# Patient Record
Sex: Female | Born: 1935
Health system: Southern US, Community
[De-identification: ages and names within clinical notes are randomized; demographics above are authoritative.]

## PROBLEM LIST (undated history)

## (undated) DIAGNOSIS — F419 Anxiety disorder, unspecified: Secondary | ICD-10-CM

## (undated) DIAGNOSIS — F32A Depression, unspecified: Secondary | ICD-10-CM

## (undated) DIAGNOSIS — M839 Adult osteomalacia, unspecified: Secondary | ICD-10-CM

## (undated) DIAGNOSIS — R42 Dizziness and giddiness: Secondary | ICD-10-CM

## (undated) DIAGNOSIS — R911 Solitary pulmonary nodule: Secondary | ICD-10-CM

## (undated) DIAGNOSIS — Z923 Personal history of irradiation: Secondary | ICD-10-CM

## (undated) DIAGNOSIS — C801 Malignant (primary) neoplasm, unspecified: Secondary | ICD-10-CM

## (undated) DIAGNOSIS — I639 Cerebral infarction, unspecified: Secondary | ICD-10-CM

## (undated) DIAGNOSIS — K224 Dyskinesia of esophagus: Secondary | ICD-10-CM

## (undated) DIAGNOSIS — M549 Dorsalgia, unspecified: Secondary | ICD-10-CM

## (undated) DIAGNOSIS — G4733 Obstructive sleep apnea (adult) (pediatric): Secondary | ICD-10-CM

## (undated) DIAGNOSIS — F329 Major depressive disorder, single episode, unspecified: Secondary | ICD-10-CM

## (undated) HISTORY — DX: Dorsalgia, unspecified: M54.9

## (undated) HISTORY — DX: Dyskinesia of esophagus: K22.4

## (undated) HISTORY — PX: CARPAL TUNNEL RELEASE: SHX101

## (undated) HISTORY — PX: APPENDECTOMY: SHX54

## (undated) HISTORY — PX: ROTATOR CUFF REPAIR: SHX139

## (undated) HISTORY — DX: Obstructive sleep apnea (adult) (pediatric): G47.33

## (undated) HISTORY — PX: ABDOMINAL HYSTERECTOMY: SHX81

---

## 1976-12-18 HISTORY — PX: BREAST EXCISIONAL BIOPSY: SUR124

## 1998-05-20 ENCOUNTER — Ambulatory Visit (HOSPITAL_COMMUNITY): Admission: RE | Admit: 1998-05-20 | Discharge: 1998-05-20 | Payer: Self-pay | Admitting: Obstetrics and Gynecology

## 1999-04-26 ENCOUNTER — Encounter: Payer: Self-pay | Admitting: Family Medicine

## 1999-04-27 ENCOUNTER — Inpatient Hospital Stay (HOSPITAL_COMMUNITY): Admission: EM | Admit: 1999-04-27 | Discharge: 1999-04-28 | Payer: Self-pay | Admitting: Emergency Medicine

## 1999-06-06 ENCOUNTER — Encounter: Payer: Self-pay | Admitting: Family Medicine

## 1999-06-06 ENCOUNTER — Ambulatory Visit (HOSPITAL_COMMUNITY): Admission: RE | Admit: 1999-06-06 | Discharge: 1999-06-06 | Payer: Self-pay | Admitting: Family Medicine

## 1999-08-02 ENCOUNTER — Other Ambulatory Visit: Admission: RE | Admit: 1999-08-02 | Discharge: 1999-08-02 | Payer: Self-pay | Admitting: Obstetrics and Gynecology

## 2000-04-16 ENCOUNTER — Encounter: Admission: RE | Admit: 2000-04-16 | Discharge: 2000-04-16 | Payer: Self-pay | Admitting: Family Medicine

## 2000-04-16 ENCOUNTER — Encounter: Payer: Self-pay | Admitting: Family Medicine

## 2000-09-27 ENCOUNTER — Other Ambulatory Visit: Admission: RE | Admit: 2000-09-27 | Discharge: 2000-09-27 | Payer: Self-pay | Admitting: Obstetrics and Gynecology

## 2002-04-16 ENCOUNTER — Other Ambulatory Visit: Admission: RE | Admit: 2002-04-16 | Discharge: 2002-04-16 | Payer: Self-pay | Admitting: Obstetrics and Gynecology

## 2002-06-10 ENCOUNTER — Ambulatory Visit (HOSPITAL_COMMUNITY): Admission: RE | Admit: 2002-06-10 | Discharge: 2002-06-10 | Payer: Self-pay | Admitting: Gastroenterology

## 2006-04-18 ENCOUNTER — Encounter: Payer: Self-pay | Admitting: Emergency Medicine

## 2007-08-09 ENCOUNTER — Ambulatory Visit (HOSPITAL_BASED_OUTPATIENT_CLINIC_OR_DEPARTMENT_OTHER): Admission: RE | Admit: 2007-08-09 | Discharge: 2007-08-09 | Payer: Self-pay | Admitting: Orthopedic Surgery

## 2007-08-28 ENCOUNTER — Ambulatory Visit (HOSPITAL_COMMUNITY): Admission: RE | Admit: 2007-08-28 | Discharge: 2007-08-28 | Payer: Self-pay | Admitting: Family Medicine

## 2007-09-04 ENCOUNTER — Ambulatory Visit (HOSPITAL_COMMUNITY): Admission: RE | Admit: 2007-09-04 | Discharge: 2007-09-04 | Payer: Self-pay | Admitting: Family Medicine

## 2007-11-01 ENCOUNTER — Emergency Department (HOSPITAL_COMMUNITY): Admission: EM | Admit: 2007-11-01 | Discharge: 2007-11-01 | Payer: Self-pay | Admitting: Emergency Medicine

## 2007-11-20 ENCOUNTER — Encounter: Admission: RE | Admit: 2007-11-20 | Discharge: 2007-11-20 | Payer: Self-pay | Admitting: Family Medicine

## 2008-06-25 ENCOUNTER — Encounter: Admission: RE | Admit: 2008-06-25 | Discharge: 2008-06-25 | Payer: Self-pay | Admitting: Family Medicine

## 2008-09-22 ENCOUNTER — Encounter: Admission: RE | Admit: 2008-09-22 | Discharge: 2008-09-22 | Payer: Self-pay | Admitting: Family Medicine

## 2008-12-21 ENCOUNTER — Encounter: Admission: RE | Admit: 2008-12-21 | Discharge: 2008-12-21 | Payer: Self-pay | Admitting: Family Medicine

## 2009-04-18 ENCOUNTER — Emergency Department (HOSPITAL_COMMUNITY): Admission: EM | Admit: 2009-04-18 | Discharge: 2009-04-18 | Payer: Self-pay | Admitting: Emergency Medicine

## 2009-05-04 ENCOUNTER — Encounter: Admission: RE | Admit: 2009-05-04 | Discharge: 2009-06-02 | Payer: Self-pay | Admitting: Family Medicine

## 2010-08-29 ENCOUNTER — Encounter: Admission: RE | Admit: 2010-08-29 | Discharge: 2010-08-29 | Payer: Self-pay | Admitting: Family Medicine

## 2011-01-09 ENCOUNTER — Encounter: Payer: Self-pay | Admitting: Family Medicine

## 2011-05-02 NOTE — Op Note (Signed)
[name], BOHNET NO.:  [id number]   MEDICAL RECORD NO.:  [id number]          PATIENT TYPE:  AMB   LOCATION:  [facility]                         FACILITY:  [facility]   PHYSICIAN:  Marlowe Kays, M.D.  DATE OF BIRTH:  02-19-1932   DATE OF PROCEDURE:  08/09/2007  DATE OF DISCHARGE:                               OPERATIVE REPORT   PREOPERATIVE DIAGNOSIS:  Severe right carpal tunnel syndrome.   POSTOPERATIVE DIAGNOSIS:  Severe right carpal tunnel syndrome.   OPERATION:  Decompression median nerve right wrist and hand.   SURGEON:  Marlowe Kays, M.D.   ASSISTANT:  Nurse   ANESTHESIA:  IV regional.   JUSTIFICATION FOR PROCEDURE:  She had significant median nerve  compression on nerve conduction studies with numbness and atrophy in her  hand. At surgery, she had marked compression of the median nerve in the  carpal canal. Proximal to the wrist, the nerve was quite bulbous and  discolored.  There was also some compression of some of the branches in  the distal palm.   DESCRIPTION OF PROCEDURE:  After satisfactory IV regional anesthesia, a  time out performed, DuraPrep from mid forearm to fingertips, was draped  in a sterile field.  I marked out a curved incision along the base of  the thenar eminence crossing obliquely over the flexor crease of the  wrist in the distal forearm.  The median nerve was identified at the  wrist with the findings noted above.  I progressively released the skin,  subcutaneous tissue, and thick fascia into the distal palm dissecting  out the individual branches.  There was no iatrogenic complication.  After I felt the decompression had been completed, I irrigated the wound  well with sterile saline and closed the skin and subcutaneous tissue  only with interrupted 4-0 nylon mast sutures.  Betadine and Adaptic dry,  sterile dressing and volar plaster splint were applied.  The tourniquet  was released.  She tolerated the procedure well and  was taken to  recovery in satisfactory condition with no known complications.           ______________________________  Marlowe Kays, M.D.     JA/MEDQ  D:  08/09/2007  T:  08/10/2007  Job:  [id number]

## 2011-06-23 ENCOUNTER — Ambulatory Visit: Payer: Medicare Other | Attending: Family Medicine | Admitting: Physical Therapy

## 2011-06-23 ENCOUNTER — Ambulatory Visit: Payer: Medicare Other | Admitting: Physical Therapy

## 2011-06-23 DIAGNOSIS — M25559 Pain in unspecified hip: Secondary | ICD-10-CM | POA: Insufficient documentation

## 2011-06-23 DIAGNOSIS — IMO0001 Reserved for inherently not codable concepts without codable children: Secondary | ICD-10-CM | POA: Insufficient documentation

## 2011-06-23 DIAGNOSIS — M25659 Stiffness of unspecified hip, not elsewhere classified: Secondary | ICD-10-CM | POA: Insufficient documentation

## 2011-06-27 ENCOUNTER — Ambulatory Visit: Payer: Medicare Other | Admitting: Physical Therapy

## 2011-06-28 ENCOUNTER — Ambulatory Visit: Payer: Medicare Other | Admitting: Physical Therapy

## 2011-07-06 ENCOUNTER — Ambulatory Visit: Payer: Medicare Other | Admitting: Physical Therapy

## 2011-07-07 ENCOUNTER — Ambulatory Visit: Payer: Medicare Other | Admitting: Physical Therapy

## 2011-07-11 ENCOUNTER — Ambulatory Visit: Payer: Medicare Other | Admitting: Physical Therapy

## 2011-07-13 ENCOUNTER — Ambulatory Visit: Payer: Medicare Other | Admitting: Physical Therapy

## 2011-07-20 ENCOUNTER — Encounter: Payer: Medicare Other | Admitting: Physical Therapy

## 2011-07-21 ENCOUNTER — Encounter: Payer: Medicare Other | Admitting: Physical Therapy

## 2011-07-25 ENCOUNTER — Ambulatory Visit: Payer: Medicare Other | Attending: Cardiology | Admitting: Physical Therapy

## 2011-07-25 DIAGNOSIS — IMO0001 Reserved for inherently not codable concepts without codable children: Secondary | ICD-10-CM | POA: Insufficient documentation

## 2011-07-25 DIAGNOSIS — M25559 Pain in unspecified hip: Secondary | ICD-10-CM | POA: Insufficient documentation

## 2011-07-25 DIAGNOSIS — M25659 Stiffness of unspecified hip, not elsewhere classified: Secondary | ICD-10-CM | POA: Insufficient documentation

## 2011-08-01 ENCOUNTER — Ambulatory Visit: Payer: Medicare Other | Admitting: Physical Therapy

## 2011-08-03 ENCOUNTER — Ambulatory Visit: Payer: Medicare Other | Admitting: Physical Therapy

## 2011-08-09 ENCOUNTER — Ambulatory Visit: Payer: Medicare Other | Admitting: Physical Therapy

## 2011-08-15 ENCOUNTER — Ambulatory Visit: Payer: Medicare Other | Admitting: Physical Therapy

## 2011-08-17 ENCOUNTER — Ambulatory Visit: Payer: Medicare Other | Admitting: Physical Therapy

## 2011-09-26 LAB — URINALYSIS, ROUTINE W REFLEX MICROSCOPIC
Bilirubin Urine: NEGATIVE
Glucose, UA: NEGATIVE
Ketones, ur: NEGATIVE
Leukocytes, UA: NEGATIVE
Nitrite: NEGATIVE
Protein, ur: NEGATIVE
Specific Gravity, Urine: 1.008
Urobilinogen, UA: 1
pH: 7.5

## 2011-09-26 LAB — CBC
HCT: 38.7
Hemoglobin: 13.9
MCHC: 35.9
MCV: 88.1
Platelets: 174
RBC: 4.4
RDW: 12.6
WBC: 6.6

## 2011-09-26 LAB — DIFFERENTIAL
Basophils Absolute: 0
Basophils Relative: 0
Eosinophils Absolute: 0.2
Eosinophils Relative: 3
Lymphocytes Relative: 25
Lymphs Abs: 1.6
Monocytes Absolute: 0.5
Monocytes Relative: 8
Neutro Abs: 4.2
Neutrophils Relative %: 64

## 2011-09-26 LAB — POCT CARDIAC MARKERS
CKMB, poc: 1.2
Myoglobin, poc: 43.8
Operator id: 4661
Troponin i, poc: <0.05

## 2011-09-26 LAB — COMPREHENSIVE METABOLIC PANEL
ALT: 13
AST: 18
Albumin: 3.9
Alkaline Phosphatase: 54
BUN: 6
CO2: 29
Calcium: 9
Chloride: 101
Creatinine, Ser: 0.69
GFR calc Af Amer: >60
GFR calc non Af Amer: >60
Glucose, Bld: 113 — ABNORMAL HIGH
Potassium: 3.9
Sodium: 137
Total Bilirubin: 0.7
Total Protein: 6.4

## 2011-09-26 LAB — URINE MICROSCOPIC-ADD ON

## 2011-09-26 LAB — LIPASE, BLOOD: Lipase: 15

## 2011-10-19 ENCOUNTER — Encounter: Payer: Self-pay | Admitting: *Deleted

## 2011-10-19 ENCOUNTER — Emergency Department (HOSPITAL_COMMUNITY): Payer: Medicare Other

## 2011-10-19 ENCOUNTER — Encounter (HOSPITAL_COMMUNITY): Payer: Self-pay | Admitting: Radiology

## 2011-10-19 ENCOUNTER — Emergency Department (HOSPITAL_BASED_OUTPATIENT_CLINIC_OR_DEPARTMENT_OTHER)
Admission: EM | Admit: 2011-10-19 | Discharge: 2011-10-19 | Discharge status: Against Medical Advice | Payer: Medicare Other | Attending: Emergency Medicine | Admitting: Emergency Medicine

## 2011-10-19 ENCOUNTER — Emergency Department (HOSPITAL_COMMUNITY)
Admission: EM | Admit: 2011-10-19 | Discharge: 2011-10-19 | Disposition: A | Discharge status: Home / Self Care | Payer: Medicare Other | Attending: Emergency Medicine | Admitting: Emergency Medicine

## 2011-10-19 DIAGNOSIS — K219 Gastro-esophageal reflux disease without esophagitis: Secondary | ICD-10-CM | POA: Insufficient documentation

## 2011-10-19 DIAGNOSIS — R079 Chest pain, unspecified: Secondary | ICD-10-CM | POA: Insufficient documentation

## 2011-10-19 DIAGNOSIS — F329 Major depressive disorder, single episode, unspecified: Secondary | ICD-10-CM | POA: Insufficient documentation

## 2011-10-19 DIAGNOSIS — F3289 Other specified depressive episodes: Secondary | ICD-10-CM | POA: Insufficient documentation

## 2011-10-19 DIAGNOSIS — J449 Chronic obstructive pulmonary disease, unspecified: Secondary | ICD-10-CM | POA: Insufficient documentation

## 2011-10-19 DIAGNOSIS — J4489 Other specified chronic obstructive pulmonary disease: Secondary | ICD-10-CM | POA: Insufficient documentation

## 2011-10-19 DIAGNOSIS — R0989 Other specified symptoms and signs involving the circulatory and respiratory systems: Secondary | ICD-10-CM | POA: Insufficient documentation

## 2011-10-19 DIAGNOSIS — R0609 Other forms of dyspnea: Secondary | ICD-10-CM | POA: Insufficient documentation

## 2011-10-19 DIAGNOSIS — M546 Pain in thoracic spine: Secondary | ICD-10-CM | POA: Insufficient documentation

## 2011-10-19 HISTORY — DX: Anxiety disorder, unspecified: F41.9

## 2011-10-19 HISTORY — DX: Solitary pulmonary nodule: R91.1

## 2011-10-19 HISTORY — DX: Major depressive disorder, single episode, unspecified: F32.9

## 2011-10-19 HISTORY — DX: Adult osteomalacia, unspecified: M83.9

## 2011-10-19 HISTORY — DX: Depression, unspecified: F32.A

## 2011-10-19 LAB — CBC
HCT: 40.5 % (ref 36.0–46.0)
Hemoglobin: 13.1 g/dL (ref 12.0–15.0)
MCH: 29.7 pg (ref 26.0–34.0)
MCHC: 32.3 g/dL (ref 30.0–36.0)
MCV: 91.8 fL (ref 78.0–100.0)
Platelets: 179 10*3/uL (ref 150–400)
RBC: 4.41 MIL/uL (ref 3.87–5.11)
RDW: 13.1 % (ref 11.5–15.5)
WBC: 7 10*3/uL (ref 4.0–10.5)

## 2011-10-19 LAB — COMPREHENSIVE METABOLIC PANEL
ALT: 9 U/L (ref 0–35)
AST: 12 U/L (ref 0–37)
Albumin: 3.9 g/dL (ref 3.5–5.2)
Alkaline Phosphatase: 70 U/L (ref 39–117)
BUN: 10 mg/dL (ref 6–23)
CO2: 28 mEq/L (ref 19–32)
Calcium: 9.8 mg/dL (ref 8.4–10.5)
Chloride: 105 mEq/L (ref 96–112)
Creatinine, Ser: 0.62 mg/dL (ref 0.50–1.10)
GFR calc Af Amer: >90 mL/min (ref >90)
GFR calc non Af Amer: 85 mL/min — ABNORMAL LOW (ref >90)
Glucose, Bld: 88 mg/dL (ref 70–99)
Potassium: 4.6 mEq/L (ref 3.5–5.1)
Sodium: 142 mEq/L (ref 135–145)
Total Bilirubin: 0.4 mg/dL (ref 0.3–1.2)
Total Protein: 6.8 g/dL (ref 6.0–8.3)

## 2011-10-19 LAB — TROPONIN I: Troponin I: <0.3 ng/mL (ref <0.30)

## 2011-10-19 LAB — CK TOTAL AND CKMB (NOT AT ARMC)
CK, MB: 2.9 ng/mL (ref 0.3–4.0)
Relative Index: INVALID (ref 0.0–2.5)
Total CK: 64 U/L (ref 7–177)

## 2011-10-19 LAB — POCT I-STAT TROPONIN I: Troponin i, poc: 0.01 ng/mL (ref 0.00–0.08)

## 2011-10-19 MED ORDER — IOHEXOL 300 MG/ML  SOLN
70.0000 mL | Freq: Once | INTRAMUSCULAR | Status: AC | PRN
  Administered 2011-10-19: 70 mL via INTRAVENOUS

## 2011-10-19 NOTE — ED Notes (Signed)
Per EMS pt was picked up from [facility]. Pt was being seen for back pain and had episode of right sided rib pain and burning in her esophagus. MD's office called 911. Pt denies pain at this time. VS BP 160/90 HR 90 R 18.

## 2011-10-19 NOTE — ED Notes (Signed)
Pt's daughter arrived and upset that pt has not seen MD, not hooked up to monitor and appears as if nothing has been done. Explained to pt and family that MD would be in soon, but daughter insistent that they leave due to lack of care. Explained to pt and family what had been done prior to arrival by MD's office, but family still insistent on leaving. Family left prior to signing AMA paperwork.

## 2011-10-30 ENCOUNTER — Other Ambulatory Visit: Payer: Self-pay | Admitting: Family Medicine

## 2011-10-30 DIAGNOSIS — Z1231 Encounter for screening mammogram for malignant neoplasm of breast: Secondary | ICD-10-CM

## 2011-11-23 ENCOUNTER — Ambulatory Visit
Admission: RE | Admit: 2011-11-23 | Discharge: 2011-11-23 | Disposition: A | Discharge status: Home / Self Care | Payer: Medicare Other | Source: Ambulatory Visit | Attending: Family Medicine | Admitting: Family Medicine

## 2011-11-23 DIAGNOSIS — Z1231 Encounter for screening mammogram for malignant neoplasm of breast: Secondary | ICD-10-CM

## 2011-12-01 ENCOUNTER — Other Ambulatory Visit: Payer: Self-pay | Admitting: Family Medicine

## 2011-12-01 DIAGNOSIS — M545 Low back pain, unspecified: Secondary | ICD-10-CM

## 2011-12-09 ENCOUNTER — Ambulatory Visit
Admission: RE | Admit: 2011-12-09 | Discharge: 2011-12-09 | Disposition: A | Discharge status: Home / Self Care | Payer: Medicare Other | Source: Ambulatory Visit | Attending: Family Medicine | Admitting: Family Medicine

## 2011-12-09 DIAGNOSIS — M545 Low back pain, unspecified: Secondary | ICD-10-CM

## 2012-04-16 ENCOUNTER — Ambulatory Visit: Payer: Medicare Other | Admitting: Physical Therapy

## 2012-04-21 ENCOUNTER — Encounter (HOSPITAL_COMMUNITY): Payer: Self-pay | Admitting: Emergency Medicine

## 2012-04-21 ENCOUNTER — Emergency Department (HOSPITAL_COMMUNITY): Payer: Medicare Other

## 2012-04-21 ENCOUNTER — Observation Stay (HOSPITAL_COMMUNITY)
Admission: EM | Admit: 2012-04-21 | Discharge: 2012-04-22 | Disposition: A | Discharge status: Home / Self Care | Payer: Medicare Other | Attending: Family Medicine | Admitting: Family Medicine

## 2012-04-21 DIAGNOSIS — F17200 Nicotine dependence, unspecified, uncomplicated: Secondary | ICD-10-CM | POA: Insufficient documentation

## 2012-04-21 DIAGNOSIS — R269 Unspecified abnormalities of gait and mobility: Secondary | ICD-10-CM

## 2012-04-21 DIAGNOSIS — M25579 Pain in unspecified ankle and joints of unspecified foot: Principal | ICD-10-CM | POA: Insufficient documentation

## 2012-04-21 DIAGNOSIS — E785 Hyperlipidemia, unspecified: Secondary | ICD-10-CM

## 2012-04-21 DIAGNOSIS — M25569 Pain in unspecified knee: Secondary | ICD-10-CM | POA: Insufficient documentation

## 2012-04-21 DIAGNOSIS — R29898 Other symptoms and signs involving the musculoskeletal system: Secondary | ICD-10-CM | POA: Insufficient documentation

## 2012-04-21 DIAGNOSIS — F32A Depression, unspecified: Secondary | ICD-10-CM

## 2012-04-21 DIAGNOSIS — W010XXA Fall on same level from slipping, tripping and stumbling without subsequent striking against object, initial encounter: Secondary | ICD-10-CM | POA: Insufficient documentation

## 2012-04-21 DIAGNOSIS — F419 Anxiety disorder, unspecified: Secondary | ICD-10-CM

## 2012-04-21 DIAGNOSIS — F3289 Other specified depressive episodes: Secondary | ICD-10-CM | POA: Insufficient documentation

## 2012-04-21 DIAGNOSIS — M839 Adult osteomalacia, unspecified: Secondary | ICD-10-CM | POA: Insufficient documentation

## 2012-04-21 DIAGNOSIS — J449 Chronic obstructive pulmonary disease, unspecified: Secondary | ICD-10-CM

## 2012-04-21 DIAGNOSIS — F411 Generalized anxiety disorder: Secondary | ICD-10-CM | POA: Insufficient documentation

## 2012-04-21 DIAGNOSIS — R937 Abnormal findings on diagnostic imaging of other parts of musculoskeletal system: Secondary | ICD-10-CM | POA: Insufficient documentation

## 2012-04-21 DIAGNOSIS — Z720 Tobacco use: Secondary | ICD-10-CM | POA: Diagnosis present

## 2012-04-21 DIAGNOSIS — J4489 Other specified chronic obstructive pulmonary disease: Secondary | ICD-10-CM | POA: Insufficient documentation

## 2012-04-21 DIAGNOSIS — S93409A Sprain of unspecified ligament of unspecified ankle, initial encounter: Secondary | ICD-10-CM | POA: Insufficient documentation

## 2012-04-21 DIAGNOSIS — F329 Major depressive disorder, single episode, unspecified: Secondary | ICD-10-CM | POA: Insufficient documentation

## 2012-04-21 DIAGNOSIS — R9389 Abnormal findings on diagnostic imaging of other specified body structures: Secondary | ICD-10-CM | POA: Diagnosis present

## 2012-04-21 DIAGNOSIS — Z79899 Other long term (current) drug therapy: Secondary | ICD-10-CM | POA: Insufficient documentation

## 2012-04-21 DIAGNOSIS — M899 Disorder of bone, unspecified: Secondary | ICD-10-CM | POA: Insufficient documentation

## 2012-04-21 NOTE — ED Notes (Signed)
Pt alert, nad, c/o right ankle ad right knee pain, onset today s/p slip fall injury, resp even unlabored, skin pwd

## 2012-04-22 ENCOUNTER — Encounter (HOSPITAL_COMMUNITY): Payer: Self-pay | Admitting: Family Medicine

## 2012-04-22 DIAGNOSIS — E785 Hyperlipidemia, unspecified: Secondary | ICD-10-CM

## 2012-04-22 DIAGNOSIS — F17200 Nicotine dependence, unspecified, uncomplicated: Secondary | ICD-10-CM

## 2012-04-22 DIAGNOSIS — E782 Mixed hyperlipidemia: Secondary | ICD-10-CM

## 2012-04-22 DIAGNOSIS — J438 Other emphysema: Secondary | ICD-10-CM

## 2012-04-22 DIAGNOSIS — R269 Unspecified abnormalities of gait and mobility: Secondary | ICD-10-CM

## 2012-04-22 DIAGNOSIS — R9389 Abnormal findings on diagnostic imaging of other specified body structures: Secondary | ICD-10-CM | POA: Diagnosis present

## 2012-04-22 DIAGNOSIS — F32A Depression, unspecified: Secondary | ICD-10-CM

## 2012-04-22 DIAGNOSIS — F329 Major depressive disorder, single episode, unspecified: Secondary | ICD-10-CM

## 2012-04-22 DIAGNOSIS — Z720 Tobacco use: Secondary | ICD-10-CM | POA: Diagnosis present

## 2012-04-22 DIAGNOSIS — F419 Anxiety disorder, unspecified: Secondary | ICD-10-CM

## 2012-04-22 DIAGNOSIS — J449 Chronic obstructive pulmonary disease, unspecified: Secondary | ICD-10-CM

## 2012-04-22 DIAGNOSIS — R937 Abnormal findings on diagnostic imaging of other parts of musculoskeletal system: Secondary | ICD-10-CM

## 2012-04-22 LAB — CBC
HCT: 37.5 % (ref 36.0–46.0)
HCT: 39.3 % (ref 36.0–46.0)
Hemoglobin: 12.8 g/dL (ref 12.0–15.0)
Hemoglobin: 13.5 g/dL (ref 12.0–15.0)
MCH: 30.5 pg (ref 26.0–34.0)
MCH: 30.6 pg (ref 26.0–34.0)
MCHC: 34.1 g/dL (ref 30.0–36.0)
MCHC: 34.4 g/dL (ref 30.0–36.0)
MCV: 88.9 fL (ref 78.0–100.0)
MCV: 89.7 fL (ref 78.0–100.0)
Platelets: 135 10*3/uL — ABNORMAL LOW (ref 150–400)
Platelets: 144 10*3/uL — ABNORMAL LOW (ref 150–400)
RBC: 4.18 MIL/uL (ref 3.87–5.11)
RBC: 4.42 MIL/uL (ref 3.87–5.11)
RDW: 12.9 % (ref 11.5–15.5)
RDW: 12.9 % (ref 11.5–15.5)
WBC: 11.4 10*3/uL — ABNORMAL HIGH (ref 4.0–10.5)
WBC: 9.3 10*3/uL (ref 4.0–10.5)

## 2012-04-22 LAB — HEPATIC FUNCTION PANEL
ALT: 10 U/L (ref 0–35)
AST: 13 U/L (ref 0–37)
Albumin: 3.4 g/dL — ABNORMAL LOW (ref 3.5–5.2)
Alkaline Phosphatase: 57 U/L (ref 39–117)
Bilirubin, Direct: 0.1 mg/dL (ref 0.0–0.3)
Indirect Bilirubin: 0.2 mg/dL — ABNORMAL LOW (ref 0.3–0.9)
Total Bilirubin: 0.3 mg/dL (ref 0.3–1.2)
Total Protein: 5.7 g/dL — ABNORMAL LOW (ref 6.0–8.3)

## 2012-04-22 LAB — BASIC METABOLIC PANEL
BUN: 13 mg/dL (ref 6–23)
BUN: 12 mg/dL (ref 6–23)
CO2: 28 mEq/L (ref 19–32)
CO2: 28 mEq/L (ref 19–32)
Calcium: 9.3 mg/dL (ref 8.4–10.5)
Calcium: 8.8 mg/dL (ref 8.4–10.5)
Chloride: 102 mEq/L (ref 96–112)
Chloride: 105 mEq/L (ref 96–112)
Creatinine, Ser: 0.69 mg/dL (ref 0.50–1.10)
Creatinine, Ser: 0.69 mg/dL (ref 0.50–1.10)
GFR calc Af Amer: >90 mL/min (ref >90)
GFR calc Af Amer: >90 mL/min (ref >90)
GFR calc non Af Amer: 82 mL/min — ABNORMAL LOW (ref >90)
GFR calc non Af Amer: 82 mL/min — ABNORMAL LOW (ref >90)
Glucose, Bld: 90 mg/dL (ref 70–99)
Glucose, Bld: 96 mg/dL (ref 70–99)
Potassium: 4.1 mEq/L (ref 3.5–5.1)
Potassium: 3.8 mEq/L (ref 3.5–5.1)
Sodium: 139 mEq/L (ref 135–145)
Sodium: 141 mEq/L (ref 135–145)

## 2012-04-22 LAB — CA 125: CA 125: 6.4 U/mL (ref 0.0–30.2)

## 2012-04-22 LAB — CANCER ANTIGEN 15-3: Cancer Antigen-Breast 15-3: 13 U/mL (ref <32)

## 2012-04-22 LAB — GLUCOSE, CAPILLARY: Glucose-Capillary: 124 mg/dL — ABNORMAL HIGH (ref 70–99)

## 2012-04-22 LAB — AFP TUMOR MARKER: AFP-Tumor Marker: <1.3 ng/mL (ref 0.0–8.0)

## 2012-04-22 LAB — CEA: CEA: 4.1 ng/mL (ref 0.0–5.0)

## 2012-04-22 LAB — CANCER ANTIGEN 27.29: CA 27.29: 20 U/mL (ref 0–39)

## 2012-04-22 LAB — CANCER ANTIGEN 19-9: CA 19-9: 8 U/mL — ABNORMAL LOW (ref <35.0)

## 2012-04-22 MED ORDER — BUPROPION HCL ER (XL) 150 MG PO TB24
150.0000 mg | ORAL_TABLET | Freq: Every day | ORAL | Status: DC
  Administered 2012-04-22: 150 mg via ORAL
  Filled 2012-04-22: qty 1

## 2012-04-22 MED ORDER — HYDROCODONE-ACETAMINOPHEN 5-325 MG PO TABS: 1.0000 | ORAL_TABLET | ORAL | Status: AC | PRN

## 2012-04-22 MED ORDER — HYDROCODONE-ACETAMINOPHEN 5-325 MG PO TABS
1.0000 | ORAL_TABLET | ORAL | Status: DC | PRN
  Administered 2012-04-22: 2 via ORAL
  Filled 2012-04-22: qty 2

## 2012-04-22 MED ORDER — TETANUS-DIPHTH-ACELL PERTUSSIS 5-2.5-18.5 LF-MCG/0.5 IM SUSP
0.5000 mL | Freq: Once | INTRAMUSCULAR | Status: DC
  Filled 2012-04-22: qty 0.5

## 2012-04-22 MED ORDER — MORPHINE SULFATE 2 MG/ML IJ SOLN: 2.0000 mg | INTRAMUSCULAR | Status: DC | PRN

## 2012-04-22 MED ORDER — ONDANSETRON HCL 4 MG/2ML IJ SOLN
4.0000 mg | Freq: Once | INTRAMUSCULAR | Status: AC
  Administered 2012-04-22: 4 mg via INTRAVENOUS
  Filled 2012-04-22: qty 2

## 2012-04-22 MED ORDER — FENTANYL CITRATE 0.05 MG/ML IJ SOLN
50.0000 ug | Freq: Once | INTRAMUSCULAR | Status: AC
  Administered 2012-04-22: 50 ug via INTRAVENOUS
  Filled 2012-04-22: qty 2

## 2012-04-22 MED ORDER — ACETAMINOPHEN 325 MG PO TABS: 650.0000 mg | ORAL_TABLET | Freq: Four times a day (QID) | ORAL | Status: DC | PRN

## 2012-04-22 MED ORDER — ENOXAPARIN SODIUM 40 MG/0.4ML ~~LOC~~ SOLN
40.0000 mg | SUBCUTANEOUS | Status: DC
  Administered 2012-04-22: 40 mg via SUBCUTANEOUS
  Filled 2012-04-22 (×2): qty 0.4

## 2012-04-22 MED ORDER — ACETAMINOPHEN 650 MG RE SUPP: 650.0000 mg | Freq: Four times a day (QID) | RECTAL | Status: DC | PRN

## 2012-04-22 MED ORDER — ATORVASTATIN CALCIUM 10 MG PO TABS
10.0000 mg | ORAL_TABLET | Freq: Every day | ORAL | Status: DC
  Filled 2012-04-22: qty 1

## 2012-04-22 MED ORDER — ESCITALOPRAM OXALATE 10 MG PO TABS
10.0000 mg | ORAL_TABLET | Freq: Every day | ORAL | Status: DC
  Administered 2012-04-22: 10 mg via ORAL
  Filled 2012-04-22: qty 1

## 2012-04-22 MED ORDER — NICOTINE 14 MG/24HR TD PT24
14.0000 mg | MEDICATED_PATCH | Freq: Every day | TRANSDERMAL | Status: DC
  Filled 2012-04-22: qty 1

## 2012-04-22 NOTE — Evaluation (Signed)
Physical Therapy Evaluation Patient Details Name: [name] MRN: [id number] DOB: 1932/01/01 Today's Date: 04/22/2012 Time: 1440-     PT Assessment / Plan / Recommendation Clinical Impression  pt will benefit from continued PT services on [facility] basis, pt/dtr plan initial 24hr care/supervision; pt fatigued but cooperative throughout; adivised pt and dtr to place tennis shoe on left foot to even leg length discrepancy caused by camboot, which also affects balance and can cause other problems (ie hip/back pain). Pt already reporting some back pain, minimal per pt    PT Assessment  All further PT needs can be met in the next venue of care    Follow Up Recommendations  Home health PT;Supervision for mobility/OOB    Equipment Recommendations  Rolling walker with 5" wheels    Frequency      Precautions / Restrictions Precautions Precautions: Fall Precaution Comments: WBAT Right LE Required Braces or Orthoses: Other Brace/Splint Other Brace/Splint: cam boot Restrictions Weight Bearing Restrictions: No   Pertinent Vitals/Pain       Mobility  Bed Mobility Bed Mobility: Supine to Sit Supine to Sit: 5: Supervision Details for Bed Mobility Assistance: increased time, cues to self assist Transfers Transfers: Sit to Stand;Stand to Sit Sit to Stand: 5: Supervision;4: Min guard;From chair/3-in-1;From bed;With upper extremity assist;With armrests Stand to Sit: With armrests;To chair/3-in-1;5: Supervision;4: Min guard Details for Transfer Assistance: cues for hand placement and safety Ambulation/Gait Ambulation/Gait Assistance: 4: Min guard Ambulation Distance (Feet): 50 Feet Assistive device: Rolling walker Ambulation/Gait Assistance Details: cues for RW saafety, sequence and posture Gait Pattern: Step-to pattern Gait velocity: slow    Exercises     PT Goals    Visit Information  Last PT Received On: 04/22/12 Assistance Needed: +1    Subjective Data  Subjective: I have  just been on the potty Patient Stated Goal: to return to PLOF   Prior Functioning  Home Living Lives With: Alone Available Help at Discharge: Family (works) Type of Home: House Home Access: Stairs to enter Secretary/administrator of Steps: 1 Home Layout: One level Home Adaptive Equipment: None Prior Function Level of Independence: Independent Driving: Yes Communication Communication: No difficulties    Cognition  Overall Cognitive Status: Appears within functional limits for tasks assessed/performed Arousal/Alertness: Awake/alert Orientation Level: Appears intact for tasks assessed Behavior During Session: [facility] for tasks performed    Extremity/Trunk Assessment Right Upper Extremity Assessment RUE ROM/Strength/Tone: [facility] for tasks assessed Left Upper Extremity Assessment LUE ROM/Strength/Tone: WFL for tasks assessed Right Lower Extremity Assessment RLE ROM/Strength/Tone: Deficits RLE ROM/Strength/Tone Deficits: right ankle NT although knee and hip at least 3+/5 for tasks assessed Left Lower Extremity Assessment LLE ROM/Strength/Tone: [facility] for tasks assessed   Balance Static Standing Balance Static Standing - Balance Support: During functional activity Static Standing - Level of Assistance: 4: Min assist;5: Stand by assistance  End of Session PT - End of Session Equipment Utilized During Treatment: Gait belt;Other (comment) (camboot on right) Activity Tolerance: Patient tolerated treatment well Patient left: in chair;with call bell/phone within reach;with family/visitor present Nurse Communication: Other (comment)   Drucilla Chalet 04/22/2012, 3:22 PM

## 2012-04-22 NOTE — Progress Notes (Signed)
Pt reports she is not diabetic however suffers from hypoglycemia. I awakened her this morning and she states she needed orange juice because she felt sick. Pt up to [facility] and given OJ.

## 2012-04-22 NOTE — Discharge Summary (Signed)
[name] MRN: [id number] DOB/AGE: 1932-01-25 78 y.o.  Admit date: 04/21/2012 Discharge date: 04/22/2012  Primary Care Physician:  Gweneth Dimitri, MD, MD   Discharge Diagnoses:   Patient Active Problem List  Diagnoses  . Abnormal finding on imaging  . Dyslipidemia  . Depression  . Anxiety  . Tobacco abuse  . COPD (chronic obstructive pulmonary disease)  . Gait abnormality    DISCHARGE MEDICATION: Medication List  As of 04/22/2012  2:07 PM   TAKE these medications         atorvastatin 10 MG tablet   Commonly known as: LIPITOR   Take 10 mg by mouth at bedtime.      buPROPion 150 MG 24 hr tablet   Commonly known as: WELLBUTRIN XL   Take 150 mg by mouth daily.      escitalopram 10 MG tablet   Commonly known as: LEXAPRO   Take 10 mg by mouth daily.      HYDROcodone-acetaminophen 5-325 MG per tablet   Commonly known as: NORCO   Take 1-2 tablets by mouth every 4 (four) hours as needed.              Consults:     SIGNIFICANT DIAGNOSTIC STUDIES:  Dg Ankle Complete Right  04/21/2012  *RADIOLOGY REPORT*  Clinical Data: Fall.  Pain.  RIGHT ANKLE - COMPLETE 3+ VIEW  Comparison: None.  Findings: No evidence for an acute fracture.  No subluxation or dislocation. Ankle mortise is preserved.  Oval cortical lucency is identified in the distal tibia.  IMPRESSION: No evidence for fracture.  Oval lucency is identified in the distal tibia.  While not definite, a  lesion from multiple myeloma or metastatic disease could have this appearance.  Bone scan or MRI could be used to further evaluate.  Original Report Authenticated By: ERIC A. MANSELL, M.D.   Dg Knee Complete 4 Views Right  04/21/2012  *RADIOLOGY REPORT*  Clinical Data: Fall.  Knee pain.  RIGHT KNEE - COMPLETE 4+ VIEW  Comparison: None.  Findings: No fracture.  No subluxation or dislocation.  No joint effusion.  IMPRESSION: No acute bony findings.  Original Report Authenticated By: ERIC A. MANSELL, M.D.       BRIEF  ADMITTING H & P: This is a 78 year old female was out to dinner with friends. She tripped and fell. At approximately 9 PM patient started developing swelling and pain in the right ankle, she came to the ER. History provided by patient and her daughters at the bedside    Hospital Course:  Present on Admission:  .Ankle injury: Pt was walking last night when she sustained a fall with injury to her right ankle. The patient reports several falls over the last 2 years and identifies some weakness in the RLE.which has been present for the last 2 years. She does not believe that the weakness has progressed, and states that it is intermittent. I has not prevented patient from performing her ADL's or being active at a community level until her fall last night.   She was evaluated in the ER and an X-ray of the ankle showed an oval lucency identified on the distal tibia. Pt's daughter reports that she was advisded by the ED Physician that she needed to stay for an MRI to be done this morning. Thus far the MRI has not been done and the patient and her daughter are requesting to be discharged home. I agree that the work-up for this finding can be done as an out-patient.  The patient is followed by Dr. Selena Batten as an out patient. I have spoken with Dr. Uvaldo Rising and she will pursue further work-up as an outpatient and schedule any further radiologic studies. Dr. Johny Sax has requested that the patient stop at her office today to pick up a kit for 24 hour urine. I will also add on a serum protein and albumin to evaluate for any evidence of hyperproteinemia and Dr. Uvaldo Rising will follow-up on these labs.   I will also order home health PT and nursing post-discharge and a rolling walker and a 3 in 1 for home use. Pt is WBAT.  Marland KitchenAbnormal finding on imaging: This could certainly indicate a wide differential which will be followed up by Dr. Uvaldo Rising as an out patient. However, this does not pose an urgent need for in-hospital  evaluation.  .Tobacco abuse: Pt counseled against further tobacco use.  Disposition and Follow-up:    DISCHARGE EXAM:  General: Alert, awake, oriented x3, in no acute distress.  Vital Signs: Blood pressure 100/64, pulse 62, temperature 98.4 F (36.9 C), temperature source Oral, resp. rate 19, height 5\' 4"  (1.626 m), weight 65.772 kg (145 lb), SpO2 96.00%. HEENT: [place]/AT PEERL, EOMI Neck: Trachea midline,  no masses, no thyromegal,y no JVD, no carotid bruit OROPHARYNX:  Moist, No exudate/ erythema/lesions.  Heart: Regular rate and rhythm, without murmurs, rubs, gallops, PMI non-displaced, no heaves or thrills on palpation.  Lungs: Clear to auscultation, no wheezing or rhonchi noted. No increased vocal fremitus resonant to percussion  Abdomen: Soft, nontender, nondistended, positive bowel sounds, no masses no hepatosplenomegaly noted..  Neuro: No focal neurological deficits noted cranial nerves II through XII grossly intact. DTRs 2+ bilaterally upper and lower extremities. Strength functional in bilateral upper extremities and LLE. Right LE in cam walker. Musculoskeletal: No warm swelling or erythema around joints, no spinal tenderness noted.     Basename 04/22/12 0606 04/22/12 0029  NA 141 139  K 3.8 4.1  CL 105 102  CO2 28 28  GLUCOSE 96 90  BUN 12 13  CREATININE 0.69 0.69  CALCIUM 8.8 9.3  MG -- --  PHOS -- --   No results found for this basename: AST:2,ALT:2,ALKPHOS:2,BILITOT:2,PROT:2,ALBUMIN:2 in the last 72 hours No results found for this basename: LIPASE:2,AMYLASE:2 in the last 72 hours  Basename 04/22/12 0606 04/22/12 0029  WBC 9.3 11.4*  NEUTROABS -- --  HGB 12.8 13.5  HCT 37.5 39.3  MCV 89.7 88.9  PLT 135* 144*   Total time for discharge process including face to face time approximately 35 minutes  Signed: Safaa Stingley A. 04/22/2012, 2:07 PM

## 2012-04-22 NOTE — Progress Notes (Signed)
CARE MANAGEMENT NOTE 04/22/2012  Patient:  [name], [name]   Account Number:  [id number]  Date Initiated:  04/22/2012  Documentation initiated by:  Colleen Can  Subjective/Objective Assessment:   DX  FALL RESULTING IN RT ANKLE AND KNEE PAIN. pT STATES UNABLE TO WALK     Action/Plan:   CMSPOKE WITH PATIENT AND DAUGHTER([name])  pT LIVES ALONE IN 1 LEVEL TOWNHOUSE IN [place]. DAUGHTER LIVES 5 MILES AWAY FROM PT. UNSURE OF D/C PLANS AT THIS TIME. PT DOES NOT HAVE DME .PT EVAL PENDING. dISCHARGE OPTIONS EXPLAINED.   Anticipated DC Date:  04/23/2012   Anticipated DC Plan:  HOME W HOME HEALTH SERVICES  In-house referral  Clinical Social Worker      DC Planning Services  CM consult      [facility] Choice  HOME HEALTH  DURABLE MEDICAL EQUIPMENT   Choice offered to / List presented to:  C-4 Adult Children           Status of service:  In process, will continue to follow Medicare Important Message given?   (If response is "NO", the following Medicare IM given date fields will be blank)   Comments:  04/22/2012 Raynelle Bring BSN CCM [phone] PT is observation status per EHR REVIEW.  OBSERVATION STATUS AS IT RELATES TO SKILLED FACILITY PLACEMENT PAYMENT EXPLAINED TO PT'S DAUGHTER. SHE VOICES UNDERSTANDING AND FEELS SURE TAKING PATIENT HOME WILL BE THE OPTION THAT THEY WILL CHOOSE. LIST OF HH AGENCIES AND PRIVATE(SELF pAY0 AGENCIES GIVEN FOR DAUGHTER TO REVIEW.

## 2012-04-22 NOTE — H&P (Signed)
PCP:   MCNEILL,WENDY, MD, MD   Chief Complaint:  Right ankle pain  HPI: This is a 78 year old female was out to dinner with friends. She tripped and fell. At approximately 9 PM patient started developing swelling and pain in the right ankle, she came to the ER. History provided by patient and her daughters at the bedside  Review of Systems: Positives bolded   anorexia, fever, weight loss,, vision loss, decreased hearing, hoarseness, chest pain, syncope, dyspnea on exertion, peripheral edema, balance deficits, hemoptysis, abdominal pain, melena, hematochezia, severe indigestion/heartburn, hematuria, incontinence, genital sores, muscle weakness, suspicious skin lesions, transient blindness, difficulty walking, depression, unusual weight change, abnormal bleeding, enlarged lymph nodes, angioedema, and breast masses.  Past Medical History: Past Medical History  Diagnosis Date  . Depression   . Anxiety   . Emphysema   . Lung nodule   . Vitamin d deficiency   . Osteomalacia    Past Surgical History  Procedure Date  . Carpal tunnel release   . Appendectomy   . Rotator cuff repair     Medications: Prior to Admission medications   Medication Sig Start Date End Date Taking? Authorizing Provider  atorvastatin (LIPITOR) 10 MG tablet Take 10 mg by mouth at bedtime.   Yes Historical Provider, MD  buPROPion (WELLBUTRIN XL) 150 MG 24 hr tablet Take 150 mg by mouth daily.   Yes Historical Provider, MD  escitalopram (LEXAPRO) 10 MG tablet Take 10 mg by mouth daily.   Yes Historical Provider, MD    Allergies:   Allergies  Allergen Reactions  . Latex     Social History:  reports that she has been smoking Cigarettes.  She has been smoking about 1 pack per day. She does not have any smokeless tobacco history on file. She reports that she does not drink alcohol or use illicit drugs.  Family History: Family History  Problem Relation Age of Onset  . Diabetes type II    . Hypertension       Physical Exam: Filed Vitals:   04/21/12 2302  BP: 142/49  Pulse: 72  Temp: 98.5 F (36.9 C)  TempSrc: Oral  Resp: 16  Height: 5\' 4"  (1.626 m)  Weight: 65.772 kg (145 lb)  SpO2: 98%    General:  Alert and oriented times three, well developed and nourished, no acute distress Eyes: PERRLA, pink conjunctiva, no scleral icterus ENT: Moist oral mucosa, neck supple, no thyromegaly Lungs: clear to ascultation, no wheeze, no crackles, no use of accessory muscles Cardiovascular: regular rate and rhythm, no regurgitation, no gallops, no murmurs. No carotid bruits, no JVD Abdomen: soft, positive BS, non-tender, non-distended, no organomegaly, not an acute abdomen GU: not examined Neuro: CN II - XII grossly intact, sensation intact Musculoskeletal: strength 5/5 all extremities, no clubbing, cyanosis or edema, right ankle tender to palpation currently no significant bruising or edema. Patient in walking boots Skin: no rash, no subcutaneous crepitation, no decubitus Psych: appropriate patient   Labs on Admission:   Basename 04/22/12 0029  NA 139  K 4.1  CL 102  CO2 28  GLUCOSE 90  BUN 13  CREATININE 0.69  CALCIUM 9.3  MG --  PHOS --   No results found for this basename: AST:2,ALT:2,ALKPHOS:2,BILITOT:2,PROT:2,ALBUMIN:2 in the last 72 hours No results found for this basename: LIPASE:2,AMYLASE:2 in the last 72 hours  Basename 04/22/12 0029  WBC 11.4*  NEUTROABS --  HGB 13.5  HCT 39.3  MCV 88.9  PLT 144*   No results found for  this basename: CKTOTAL:3,CKMB:3,CKMBINDEX:3,TROPONINI:3 in the last 72 hours No components found with this basename: POCBNP:3 No results found for this basename: DDIMER:2 in the last 72 hours No results found for this basename: HGBA1C:2 in the last 72 hours No results found for this basename: CHOL:2,HDL:2,LDLCALC:2,TRIG:2,CHOLHDL:2,LDLDIRECT:2 in the last 72 hours No results found for this basename: TSH,T4TOTAL,FREET3,T3FREE,THYROIDAB in the last 72  hours No results found for this basename: VITAMINB12:2,FOLATE:2,FERRITIN:2,TIBC:2,IRON:2,RETICCTPCT:2 in the last 72 hours  Micro Results: No results found for this or any previous visit (from the past 240 hour(s)).   Radiological Exams on Admission: Dg Ankle Complete Right  04/21/2012  *RADIOLOGY REPORT*  Clinical Data: Fall.  Pain.  RIGHT ANKLE - COMPLETE 3+ VIEW  Comparison: None.  Findings: No evidence for an acute fracture.  No subluxation or dislocation. Ankle mortise is preserved.  Oval cortical lucency is identified in the distal tibia.  IMPRESSION: No evidence for fracture.  Oval lucency is identified in the distal tibia.  While not definite, a  lesion from multiple myeloma or metastatic disease could have this appearance.  Bone scan or MRI could be used to further evaluate.  Original Report Authenticated By: ERIC A. MANSELL, M.D.   Dg Knee Complete 4 Views Right  04/21/2012  *RADIOLOGY REPORT*  Clinical Data: Fall.  Knee pain.  RIGHT KNEE - COMPLETE 4+ VIEW  Comparison: None.  Findings: No fracture.  No subluxation or dislocation.  No joint effusion.  IMPRESSION: No acute bony findings.  Original Report Authenticated By: ERIC A. MANSELL, M.D.    Assessment/Plan Present on Admission:  .Abnormal finding on imaging Admit to MedSurg Tumor markers ordered Bone scan ordered for the a.m. chest x-ray recommended with smoking history, however, currently the family declines. Per daughter they're concerned Re: radiation exposure  .Tobacco abuse Nicotine patch, nebulizers Right ankle strain Patient in walking boots Depression/anxiety Dyslipidemia Osteomalacia Stable resume home medications  Full code DVT prophylaxis Team 4/Dr. Loura Pardon, Atom Solivan 04/22/2012, 2:32 AM

## 2012-04-22 NOTE — ED Notes (Signed)
Pt states pain much better, Dr. Dierdre Highman at bedside, attempted to ambulate pt, pt states she lives alone and does not feel like she would be able to ambulate to the BR and to manage pain. Pt undressed, Cam walker in place

## 2012-04-22 NOTE — Progress Notes (Signed)
Ur completed; code 95 given

## 2012-04-22 NOTE — ED Provider Notes (Signed)
History     CSN: [id number]  Arrival date & time 04/21/12  2226   First MD Initiated Contact with Patient 04/21/12 2332      Chief Complaint  Patient presents with  . Ankle Pain    (Consider location/radiation/quality/duration/timing/severity/associated sxs/prior treatment) HPI History provided by patient. Walking earlier today and tripped injuring her right knee and right ankle. Patient has history of arthritis causes her regular aches and pains. After injury today patient will home rest or leg and a few hours later symptoms seem to be getting worse. She is now the point that she cannot bear weight due to pain. Sharp in quality and not radiating from knee or ankle. No associated weakness or numbness. No hip pain or injury. No upper extremity injury. No head or neck injury.  Past Medical History  Diagnosis Date  . Depression   . Anxiety   . Emphysema   . Lung nodule   . Vitamin d deficiency   . Osteomalacia     History reviewed. No pertinent past surgical history.  No family history on file.  History  Substance Use Topics  . Smoking status: Current Everyday Smoker -- 1.0 packs/day    Types: Cigarettes  . Smokeless tobacco: Not on file  . Alcohol Use:     OB History    Grav Para Term Preterm Abortions TAB SAB Ect Mult Living                  Review of Systems  Constitutional: Negative for fever and chills.  HENT: Negative for neck pain and neck stiffness.   Eyes: Negative for pain.  Respiratory: Negative for shortness of breath.   Cardiovascular: Negative for chest pain.  Gastrointestinal: Negative for abdominal pain.  Genitourinary: Negative for dysuria.  Musculoskeletal: Negative for back pain.  Skin: Negative for rash.  Neurological: Negative for headaches.  All other systems reviewed and are negative.    Allergies  Latex  Home Medications   Current Outpatient Rx  Name Route Sig Dispense Refill  . ATORVASTATIN CALCIUM 10 MG PO TABS Oral Take 10 mg  by mouth at bedtime.    . BUPROPION HCL ER (XL) 150 MG PO TB24 Oral Take 150 mg by mouth daily.    Marland Kitchen ESCITALOPRAM OXALATE 10 MG PO TABS Oral Take 10 mg by mouth daily.      BP 142/49  Pulse 72  Temp(Src) 98.5 F (36.9 C) (Oral)  Resp 16  Ht 5\' 4"  (1.626 m)  Wt 145 lb (65.772 kg)  BMI 24.89 kg/m2  SpO2 98%  Physical Exam  Constitutional: She is oriented to person, place, and time. She appears well-developed and well-nourished.  HENT:  Head: Normocephalic and atraumatic.  Eyes: Conjunctivae and EOM are normal. Pupils are equal, round, and reactive to light.  Neck: Trachea normal. Neck supple. No thyromegaly present.  Cardiovascular: Normal rate, regular rhythm, S1 normal, S2 normal and normal pulses.     No systolic murmur is present   No diastolic murmur is present  Pulses:      Radial pulses are 2+ on the right side, and 2+ on the left side.  Pulmonary/Chest: Effort normal and breath sounds normal. She has no wheezes. She has no rhonchi. She has no rales. She exhibits no tenderness.  Abdominal: Soft. Normal appearance and bowel sounds are normal. There is no tenderness. There is no CVA tenderness and negative Murphy's sign.  Musculoskeletal:       Right lower extremity with abrasion  over right knee and mild tenderness to palpation. Mild effusion. Good range of motion. Right ankle with tenderness over lateral malleolus and moderate swelling. No ecchymosis. No foot tenderness and distal neurovascular intact with equal dorsalis pedis pulses.   Neurological: She is alert and oriented to person, place, and time. She has normal strength. No cranial nerve deficit or sensory deficit. GCS eye subscore is 4. GCS verbal subscore is 5. GCS motor subscore is 6.  Skin: Skin is warm and dry. No rash noted. She is not diaphoretic.  Psychiatric: Her speech is normal.       Cooperative and appropriate    ED Course  Procedures (including critical care time)  Labs Reviewed - No data to  display Dg Ankle Complete Right  04/21/2012  *RADIOLOGY REPORT*  Clinical Data: Fall.  Pain.  RIGHT ANKLE - COMPLETE 3+ VIEW  Comparison: None.  Findings: No evidence for an acute fracture.  No subluxation or dislocation. Ankle mortise is preserved.  Oval cortical lucency is identified in the distal tibia.  IMPRESSION: No evidence for fracture.  Oval lucency is identified in the distal tibia.  While not definite, a  lesion from multiple myeloma or metastatic disease could have this appearance.  Bone scan or MRI could be used to further evaluate.  Original Report Authenticated By: ERIC A. MANSELL, M.D.   Dg Knee Complete 4 Views Right  04/21/2012  *RADIOLOGY REPORT*  Clinical Data: Fall.  Knee pain.  RIGHT KNEE - COMPLETE 4+ VIEW  Comparison: None.  Findings: No fracture.  No subluxation or dislocation.  No joint effusion.  IMPRESSION: No acute bony findings.  Original Report Authenticated By: ERIC A. MANSELL, M.D.    Pain medications provided. X-rays obtained and reviewed as above. Despite multiple rounds of IV narcotics patient unable to ambulate. Given tibial lesion with severe symptoms, medical consult obtained. Case discussed as above with Dr. Joneen Roach who agrees to admission.    MDM   Right ankle and knee contusion /sprain with tibial lesion on plain films. Admit pain control and further evaluation        Sunnie Nielsen, MD 04/22/12 [phone]

## 2012-04-24 LAB — PROTEIN ELECTROPHORESIS, SERUM
Albumin ELP: 62.5 % (ref 55.8–66.1)
Alpha-1-Globulin: 5.3 % — ABNORMAL HIGH (ref 2.9–4.9)
Alpha-2-Globulin: 10.3 % (ref 7.1–11.8)
Beta 2: 3.8 % (ref 3.2–6.5)
Beta Globulin: 5.6 % (ref 4.7–7.2)
Gamma Globulin: 12.5 % (ref 11.1–18.8)
M-Spike, %: NOT DETECTED g/dL
Total Protein ELP: 5.4 g/dL — ABNORMAL LOW (ref 6.0–8.3)

## 2012-04-30 ENCOUNTER — Other Ambulatory Visit: Payer: Self-pay | Admitting: Family Medicine

## 2012-04-30 DIAGNOSIS — M899 Disorder of bone, unspecified: Secondary | ICD-10-CM

## 2012-04-30 DIAGNOSIS — M949 Disorder of cartilage, unspecified: Secondary | ICD-10-CM

## 2012-05-05 ENCOUNTER — Ambulatory Visit
Admission: RE | Admit: 2012-05-05 | Discharge: 2012-05-05 | Disposition: A | Discharge status: Home / Self Care | Payer: Medicare Other | Source: Ambulatory Visit | Attending: Family Medicine | Admitting: Family Medicine

## 2012-05-05 DIAGNOSIS — M899 Disorder of bone, unspecified: Secondary | ICD-10-CM

## 2012-05-05 MED ORDER — GADOBENATE DIMEGLUMINE 529 MG/ML IV SOLN: 13.0000 mL | Freq: Once | INTRAVENOUS | Status: AC | PRN

## 2012-05-07 ENCOUNTER — Ambulatory Visit: Payer: Medicare Other | Attending: Family Medicine | Admitting: Physical Therapy

## 2012-05-07 DIAGNOSIS — M545 Low back pain, unspecified: Secondary | ICD-10-CM | POA: Insufficient documentation

## 2012-05-07 DIAGNOSIS — R5381 Other malaise: Secondary | ICD-10-CM | POA: Insufficient documentation

## 2012-05-07 DIAGNOSIS — M25579 Pain in unspecified ankle and joints of unspecified foot: Secondary | ICD-10-CM | POA: Insufficient documentation

## 2012-05-07 DIAGNOSIS — IMO0001 Reserved for inherently not codable concepts without codable children: Secondary | ICD-10-CM | POA: Insufficient documentation

## 2012-05-07 DIAGNOSIS — M25676 Stiffness of unspecified foot, not elsewhere classified: Secondary | ICD-10-CM | POA: Insufficient documentation

## 2012-05-07 DIAGNOSIS — M25673 Stiffness of unspecified ankle, not elsewhere classified: Secondary | ICD-10-CM | POA: Insufficient documentation

## 2012-05-09 ENCOUNTER — Ambulatory Visit: Payer: Medicare Other | Admitting: Physical Therapy

## 2012-05-21 ENCOUNTER — Ambulatory Visit: Payer: Medicare Other | Attending: Family Medicine | Admitting: Physical Therapy

## 2012-05-21 DIAGNOSIS — R5381 Other malaise: Secondary | ICD-10-CM | POA: Insufficient documentation

## 2012-05-21 DIAGNOSIS — M25673 Stiffness of unspecified ankle, not elsewhere classified: Secondary | ICD-10-CM | POA: Insufficient documentation

## 2012-05-21 DIAGNOSIS — M25676 Stiffness of unspecified foot, not elsewhere classified: Secondary | ICD-10-CM | POA: Insufficient documentation

## 2012-05-21 DIAGNOSIS — IMO0001 Reserved for inherently not codable concepts without codable children: Secondary | ICD-10-CM | POA: Insufficient documentation

## 2012-05-21 DIAGNOSIS — M545 Low back pain, unspecified: Secondary | ICD-10-CM | POA: Insufficient documentation

## 2012-05-21 DIAGNOSIS — M25579 Pain in unspecified ankle and joints of unspecified foot: Secondary | ICD-10-CM | POA: Insufficient documentation

## 2012-05-23 ENCOUNTER — Ambulatory Visit: Payer: Medicare Other | Admitting: Physical Therapy

## 2012-05-28 ENCOUNTER — Encounter: Payer: Medicare Other | Admitting: Physical Therapy

## 2012-06-04 ENCOUNTER — Encounter: Payer: Medicare Other | Admitting: Physical Therapy

## 2012-06-06 ENCOUNTER — Encounter: Payer: Medicare Other | Admitting: Physical Therapy

## 2012-06-11 ENCOUNTER — Encounter: Payer: Medicare Other | Admitting: Physical Therapy

## 2012-06-13 ENCOUNTER — Encounter: Payer: Medicare Other | Admitting: Physical Therapy

## 2012-06-27 ENCOUNTER — Other Ambulatory Visit: Payer: Self-pay | Admitting: Family Medicine

## 2012-06-27 DIAGNOSIS — R109 Unspecified abdominal pain: Secondary | ICD-10-CM

## 2012-07-01 ENCOUNTER — Ambulatory Visit
Admission: RE | Admit: 2012-07-01 | Discharge: 2012-07-01 | Disposition: A | Discharge status: Home / Self Care | Payer: Medicare Other | Source: Ambulatory Visit | Attending: Family Medicine | Admitting: Family Medicine

## 2012-07-01 DIAGNOSIS — R109 Unspecified abdominal pain: Secondary | ICD-10-CM

## 2012-07-01 MED ORDER — IOHEXOL 300 MG/ML  SOLN
100.0000 mL | Freq: Once | INTRAMUSCULAR | Status: AC | PRN
  Administered 2012-07-01: 100 mL via INTRAVENOUS

## 2012-11-25 ENCOUNTER — Other Ambulatory Visit: Payer: Self-pay | Admitting: Family Medicine

## 2012-11-25 DIAGNOSIS — Z1231 Encounter for screening mammogram for malignant neoplasm of breast: Secondary | ICD-10-CM

## 2013-01-22 ENCOUNTER — Ambulatory Visit: Payer: Medicare Other

## 2013-02-26 ENCOUNTER — Ambulatory Visit
Admission: RE | Admit: 2013-02-26 | Discharge: 2013-02-26 | Disposition: A | Discharge status: Home / Self Care | Payer: Medicare Other | Source: Ambulatory Visit | Attending: Family Medicine | Admitting: Family Medicine

## 2013-02-26 DIAGNOSIS — Z1231 Encounter for screening mammogram for malignant neoplasm of breast: Secondary | ICD-10-CM

## 2013-02-28 ENCOUNTER — Other Ambulatory Visit: Payer: Self-pay | Admitting: Family Medicine

## 2013-02-28 DIAGNOSIS — R928 Other abnormal and inconclusive findings on diagnostic imaging of breast: Secondary | ICD-10-CM

## 2013-03-12 ENCOUNTER — Ambulatory Visit
Admission: RE | Admit: 2013-03-12 | Discharge: 2013-03-12 | Disposition: A | Discharge status: Home / Self Care | Payer: Medicare Other | Source: Ambulatory Visit | Attending: Family Medicine | Admitting: Family Medicine

## 2013-03-12 DIAGNOSIS — R928 Other abnormal and inconclusive findings on diagnostic imaging of breast: Secondary | ICD-10-CM

## 2013-08-04 ENCOUNTER — Other Ambulatory Visit: Payer: Self-pay | Admitting: Family Medicine

## 2013-08-04 DIAGNOSIS — N63 Unspecified lump in unspecified breast: Secondary | ICD-10-CM

## 2013-08-21 ENCOUNTER — Ambulatory Visit
Admission: RE | Admit: 2013-08-21 | Discharge: 2013-08-21 | Disposition: A | Discharge status: Home / Self Care | Payer: Medicare Other | Source: Ambulatory Visit | Attending: Family Medicine | Admitting: Family Medicine

## 2013-08-21 DIAGNOSIS — N63 Unspecified lump in unspecified breast: Secondary | ICD-10-CM

## 2014-01-15 ENCOUNTER — Other Ambulatory Visit: Payer: Self-pay | Admitting: Family Medicine

## 2014-01-15 DIAGNOSIS — N6009 Solitary cyst of unspecified breast: Secondary | ICD-10-CM

## 2014-01-26 ENCOUNTER — Other Ambulatory Visit: Payer: Medicare Other

## 2014-03-02 ENCOUNTER — Ambulatory Visit
Admission: RE | Admit: 2014-03-02 | Discharge: 2014-03-02 | Disposition: A | Discharge status: Home / Self Care | Payer: Medicare Other | Source: Ambulatory Visit | Attending: Family Medicine | Admitting: Family Medicine

## 2014-03-02 DIAGNOSIS — N6009 Solitary cyst of unspecified breast: Secondary | ICD-10-CM

## 2014-10-22 ENCOUNTER — Other Ambulatory Visit: Payer: Self-pay | Admitting: Family Medicine

## 2014-10-22 DIAGNOSIS — R1013 Epigastric pain: Secondary | ICD-10-CM

## 2014-10-30 ENCOUNTER — Ambulatory Visit
Admission: RE | Admit: 2014-10-30 | Discharge: 2014-10-30 | Disposition: A | Discharge status: Home / Self Care | Payer: Medicare Other | Source: Ambulatory Visit | Attending: Family Medicine | Admitting: Family Medicine

## 2014-10-30 DIAGNOSIS — R1013 Epigastric pain: Secondary | ICD-10-CM

## 2015-03-25 ENCOUNTER — Other Ambulatory Visit: Payer: Self-pay

## 2015-03-25 DIAGNOSIS — Z1231 Encounter for screening mammogram for malignant neoplasm of breast: Secondary | ICD-10-CM

## 2015-03-29 ENCOUNTER — Ambulatory Visit
Admission: RE | Admit: 2015-03-29 | Discharge: 2015-03-29 | Disposition: A | Discharge status: Home / Self Care | Payer: Medicare Other | Source: Ambulatory Visit

## 2015-03-29 DIAGNOSIS — Z1231 Encounter for screening mammogram for malignant neoplasm of breast: Secondary | ICD-10-CM

## 2016-02-22 ENCOUNTER — Other Ambulatory Visit: Payer: Self-pay

## 2016-02-22 DIAGNOSIS — R0989 Other specified symptoms and signs involving the circulatory and respiratory systems: Secondary | ICD-10-CM

## 2016-03-17 ENCOUNTER — Encounter: Payer: Self-pay | Admitting: Vascular Surgery

## 2016-03-21 ENCOUNTER — Other Ambulatory Visit: Payer: Self-pay

## 2016-03-21 DIAGNOSIS — Z1231 Encounter for screening mammogram for malignant neoplasm of breast: Secondary | ICD-10-CM

## 2016-03-23 ENCOUNTER — Encounter: Payer: Self-pay | Admitting: Vascular Surgery

## 2016-03-23 ENCOUNTER — Ambulatory Visit (HOSPITAL_COMMUNITY)
Admission: RE | Admit: 2016-03-23 | Discharge: 2016-03-23 | Disposition: A | Discharge status: Home / Self Care | Payer: Medicare Other | Source: Ambulatory Visit | Attending: Vascular Surgery | Admitting: Vascular Surgery

## 2016-03-23 ENCOUNTER — Ambulatory Visit (INDEPENDENT_AMBULATORY_CARE_PROVIDER_SITE_OTHER): Payer: Medicare Other | Admitting: Vascular Surgery

## 2016-03-23 VITALS — BP 133/73 | HR 61 | Ht 64.0 in | Wt 150.5 lb

## 2016-03-23 DIAGNOSIS — I739 Peripheral vascular disease, unspecified: Secondary | ICD-10-CM

## 2016-03-23 DIAGNOSIS — R0989 Other specified symptoms and signs involving the circulatory and respiratory systems: Secondary | ICD-10-CM

## 2016-03-23 NOTE — Progress Notes (Signed)
Referring Physician: Dr Nelva Bush Patient name: [name] MRN: [id number] DOB: 27-Aug-1932 Sex: female  REASON FOR CONSULT: Left leg pain HPI: [name] is a 82 y.o. female with a 6 month history of occasional pain in her left calf and foot. She states that sometimes when she is sitting her left foot will turn purple. This resolves spontaneously. She states that walking from her house to take her trash out her left calf will have some cramping sensation on the lateral aspect but this only occurs about 1 out of every 10 times when she takes a trash out. It is not consistent in its duration its walking distance or its frequency. She is a current smoker and has tried to quit in the past unsuccessfully. Greater than 3 minutes today spent regarding smoking cessation counseling. She also has a history of chronic back pain and has multiple cervical and lumbar spine disc disease with loss of height as well. She is being treated for this. She denies rest pain in her foot. She has no history of nonhealing wounds.  Other medical problems include mild depression and anxiety, COPD. These are stable. She denies history of diabetes.  Past Medical History  Diagnosis Date  . Depression   . Anxiety   . Emphysema   . Lung nodule   . Vitamin D deficiency   . Osteomalacia     Family History  Problem Relation Age of Onset  . Diabetes type II    . Hypertension     Past Surgical History  Procedure Laterality Date  . Carpal tunnel release    . Appendectomy    . Rotator cuff repair      SOCIAL HISTORY: Social History   Social History  . Marital Status: Widowed    Spouse Name: N/A  . Number of Children: N/A  . Years of Education: N/A   Occupational History  . Not on file.   Social History Main Topics  . Smoking status: Current Every Day Smoker -- 1.00 packs/day    Types: Cigarettes  . Smokeless tobacco: Not on file  . Alcohol Use: No  . Drug Use: No  . Sexual Activity: Not Currently    Other Topics Concern  . Not on file   Social History Narrative    Allergies  Allergen Reactions  . Latex     Current Outpatient Prescriptions  Medication Sig Dispense Refill  . Acetaminophen (TYLENOL 8 HOUR PO) Take by mouth.    Marland Kitchen atorvastatin (LIPITOR) 10 MG tablet Take 10 mg by mouth at bedtime.    Marland Kitchen buPROPion (WELLBUTRIN XL) 150 MG 24 hr tablet Take 150 mg by mouth daily.    Marland Kitchen CALCIUM PO Take by mouth.    . Cholecalciferol (VITAMIN D) 2000 units tablet Take 2,000 Units by mouth daily.    Marland Kitchen escitalopram (LEXAPRO) 10 MG tablet Take 10 mg by mouth daily.    Marland Kitchen gabapentin (NEURONTIN) 300 MG capsule Take 300 mg by mouth 2 (two) times daily.  11   No current facility-administered medications for this visit.    ROS:   General:  No weight loss, Fever, chills  HEENT: No recent headaches, no nasal bleeding, no visual changes, no sore throat  Neurologic: No dizziness, blackouts, seizures. No recent symptoms of stroke or mini- stroke. No recent episodes of slurred speech, or temporary blindness.  Cardiac: No recent episodes of chest pain/pressure, no shortness of breath at rest.  + shortness of breath with exertion.  Denies history of atrial fibrillation or irregular heartbeat  Vascular: No history of rest pain in feet.  No history of claudication.  No history of non-healing ulcer, No history of DVT   Pulmonary: No home oxygen, no productive cough, no hemoptysis,  No asthma or wheezing  Musculoskeletal:  [x ] Arthritis, [x ] Low back pain,  [x ] Joint pain  Hematologic:No history of hypercoagulable state.  No history of easy bleeding.  No history of anemia  Gastrointestinal: No hematochezia or melena,  No gastroesophageal reflux, no trouble swallowing  Urinary: [ ]  chronic Kidney disease, [ ]  on HD - [ ]  MWF or [ ]  TTHS, [ ]  Burning with urination, [ ]  Frequent urination, [ ]  Difficulty urinating;   Skin: No rashes  Psychological: + history of anxiety,  + history of  depression   Physical Examination  Filed Vitals:   03/23/16 1422  BP: 133/73  Pulse: 61  Height: 5\' 4"  (1.626 m)  Weight: 150 lb 8 oz (68.266 kg)  SpO2: 95%    Body mass index is 25.82 kg/(m^2).  General:  Alert and oriented, no acute distress HEENT: Normal Neck: No bruit or JVD Pulmonary: Clear to auscultation bilaterally Cardiac: Regular Rate and Rhythm without murmur Abdomen: Soft, non-tender, non-distended, no mass Skin: No rash Extremity Pulses:  2+ radial, brachial, femoral,2+ right 1+ left dorsalis pedis, absent posterior tibial pulses bilaterally Musculoskeletal: No deformity or edema  Neurologic: Upper and lower extremity motor 5/5 and symmetric  DATA:  Patient had bilateral ABIs performed today which were greater than 1 on the right 0.73 on the left biphasic waveforms bilaterally with triphasic waveforms in the right dorsalis pedis  ASSESSMENT:  Patient with left lower extremity pain but with a palpable pulse in her left foot. Her ABIs suggest she has mild arterial occlusive disease in the left leg. I do not believe the discoloration in her left foot is related to arterial occlusive disease. This may be related to sympathetic discharge from her back pain. She currently has adequate perfusion to the left leg and is not at risk of limb loss. I discussed with her today that her risk of limb loss lifetime is less than 5% if she is able to quit smoking. She will try to walk for 30 minutes daily and quit smoking. If her symptoms become worse over time we could consider an arteriogram however symptoms are fairly mild currently. Her symptoms are also probably multifactorial related to her back pain as well as arterial occlusive disease. This is primarily since her symptoms are not really reproducible on a daily basis.   PLAN:  Follow-up appointment and ABIs in 6 months time.   Ruta Hinds, MD [facility] Office: [phone] Pager:  [phone]

## 2016-04-12 ENCOUNTER — Ambulatory Visit
Admission: RE | Admit: 2016-04-12 | Discharge: 2016-04-12 | Disposition: A | Discharge status: Home / Self Care | Payer: Medicare Other | Source: Ambulatory Visit

## 2016-04-12 DIAGNOSIS — Z1231 Encounter for screening mammogram for malignant neoplasm of breast: Secondary | ICD-10-CM

## 2016-04-17 ENCOUNTER — Other Ambulatory Visit: Payer: Self-pay | Admitting: *Deleted

## 2016-04-17 DIAGNOSIS — I739 Peripheral vascular disease, unspecified: Secondary | ICD-10-CM

## 2016-09-15 ENCOUNTER — Ambulatory Visit: Payer: Medicare Other | Attending: Family Medicine | Admitting: Physical Therapy

## 2016-09-15 ENCOUNTER — Encounter: Payer: Self-pay | Admitting: Physical Therapy

## 2016-09-15 DIAGNOSIS — M6281 Muscle weakness (generalized): Secondary | ICD-10-CM

## 2016-09-15 DIAGNOSIS — M545 Low back pain, unspecified: Secondary | ICD-10-CM

## 2016-09-15 DIAGNOSIS — R262 Difficulty in walking, not elsewhere classified: Secondary | ICD-10-CM | POA: Diagnosis present

## 2016-09-15 NOTE — Patient Instructions (Addendum)
Bracing With Bridging (Hook-Lying)    With neutral spine, tighten pelvic floor and abdominals and hold. Lift bottom. Repeat __10_ times.  Holding 3-5 seconds. 2 times a day.   Knee to Chest    Lying supine, bend involved knee to chest _3__ times. Holding 20-30 seconds. Repeat with other leg. Do __2_ times per day.      Pelvic Tilt: Posterior - Legs Bent (Supine)    Tighten stomach and flatten back by rolling pelvis down. Hold ____ seconds. Relax. Repeat ___10_ times per set.  Do _2___ sessions per day.  .   http://orth.exer.us/203   Copyright  VHI. All rights reserved.  old 3-5 seconds. Do _2__ times a day.

## 2016-09-15 NOTE — Therapy (Signed)
[facility] [address], [place], [place], [address] Phone: [phone]   Fax:  [phone]  Physical Therapy Evaluation  Patient Details  Name: [name] MRN: [id number] Date of Birth: 80-Jan-1933 Referring Provider: Theadore Nan, MD  Encounter Date: 80/29/2017      PT End of Session - 09/15/16 1038    Visit Number 1   Number of Visits 16   Date for PT Re-Evaluation 11/14/16   Authorization Type KX modifier at 15 visit   PT Start Time 1020   PT Stop Time 1110   PT Time Calculation (min) 50 min   Activity Tolerance Patient tolerated treatment well   Behavior During Therapy [facility] for tasks assessed/performed      Past Medical History:  Diagnosis Date  . Anxiety   . Depression   . Emphysema   . Lung nodule   . Osteomalacia   . Vitamin D deficiency     Past Surgical History:  Procedure Laterality Date  . APPENDECTOMY    . CARPAL TUNNEL RELEASE    . ROTATOR CUFF REPAIR      There were no vitals filed for this visit.       Subjective Assessment - 09/15/16 1025    Subjective Pt reporting chronic low back pain that has been worsening over the last few months. Pt reporting 5/10 at rest and 10/10 with movements. Pt reporting difficulty with ADL's and household activities.  Pt was issued an injection which helped only for a few days and then pt reporting vertigo. Pt's PCP, Dr Leonides Schanz  is weaning pt off her anxiety medicaiton.    Limitations Lifting;Standing;Walking;House hold activities;Sitting   How long can you sit comfortably? 20 minutes, depending on the chair surface   How long can you stand comfortably? 5 minutes   How long can you walk comfortably? 10 minutes   Patient Stated Goals Stop hurting in my back.    Currently in Pain? Yes   Pain Score 5   10 with movements   Pain Location Back   Pain Orientation Lower;Mid   Pain Descriptors / Indicators Aching;Throbbing   Pain Type Chronic pain    Pain Onset More than a month ago   Pain Frequency Constant   Aggravating Factors  any movements   Pain Relieving Factors sitting, lying, heat, pain medications   Effect of Pain on Daily Activities pt reporting difficulty with household chores, diffictuly cooking, pt is unable to clean   Multiple Pain Sites No            [facility] PT Assessment - 09/15/16 0001      Assessment   Medical Diagnosis low back pain   Referring Provider Theadore Nan, MD   Onset Date/Surgical Date 06/15/16   Hand Dominance Right   Prior Therapy none     Precautions   Precautions None     Balance Screen   Has the patient fallen in the past 6 months Yes   How many times? 1   Has the patient had a decrease in activity level because of a fear of falling?  Yes   Is the patient reluctant to leave their home because of a fear of falling?  Yes     [place] Private residence   Living Arrangements Spouse/significant other   Available Help at Discharge Friend(s)   Type of [place] Access Level entry   [place] One level  Home Equipment Toilet riser;Grab bars - tub/shower     Prior Function   Level of Independence Independent with basic ADLs   Vocation Retired   Leisure antique shopping, reading     Cognition   Overall Cognitive Status Within Functional Limits for tasks assessed     Observation/Other Assessments   Focus on Therapeutic Outcomes (FOTO)  67% limitation     ROM / Strength   AROM / PROM / Strength AROM;Strength     AROM   AROM Assessment Site Lumbar   Lumbar Flexion 20  painful   Lumbar Extension 5  painful   Lumbar - Right Side Bend 10  painful   Lumbar - Left Side Bend 5  painful   Lumbar - Right Rotation Limited due to pain   Lumbar - Left Rotation limited due to pain     Strength   Strength Assessment Site Hip;Knee   Right/Left Hip Right;Left   Right Hip Flexion 3-/5   Right Hip Extension 3-/5   Right Hip ABduction 3-/5    Right Hip ADduction 3-/5   Left Hip Flexion 2/5   Left Hip Extension 2/5   Left Hip ABduction 2/5   Left Hip ADduction 2/5   Right/Left Knee Right;Left   Right Knee Flexion 3+/5   Right Knee Extension 3/5   Left Knee Flexion 2+/5   Left Knee Extension 2+/5     Flexibility   Soft Tissue Assessment /Muscle Length yes   Hamstrings L=35 degrees, R = 60 degrees     Transfers   Comments 30 second sit to stand = 1 time, pt with increasing pain with transfers     Ambulation/Gait   Ambulation/Gait Yes   Ambulation/Gait Assistance 6: Modified independent (Device/Increase time)   Ambulation Distance (Feet) 50 Feet   Assistive device None   Gait Pattern Step-through pattern;Decreased arm swing - right;Decreased arm swing - left;Decreased step length - left;Ataxic;Poor foot clearance - left;Poor foot clearance - right   Ambulation Surface Level;Indoor   Gait Comments During amb, pt experienced a LOB with head turn to the L and required CGA to recover.                            PT Education - 09/15/16 1122    Education provided Yes   Education Details Pt was issued a HEP   Person(s) Educated Patient   Methods Explanation;Demonstration;Tactile cues;Verbal cues  Pt left handout in clinic   Comprehension Verbalized understanding;Returned demonstration;Verbal cues required;Need further instruction;Tactile cues required          PT Short Term Goals - 09/15/16 1136      PT SHORT TERM GOAL #1   Title Pt will be independent with her HEP to improve functional mobility.    Baseline Issued HEP at evalaution   Time 4   Period Weeks   Status New     PT SHORT TERM GOAL #2   Title Pt will report pain of <8/10 with functional movements.    Baseline Currently all movements pain is reported 10/10.    Time 4   Period Weeks   Status New           PT Long Term Goals - 09/15/16 1138      PT LONG TERM GOAL #1   Title Pt will improve her FOTO from 67% limitation to 57%  limitation.    Baseline 67% limitation   Time 8   Period  Weeks   Status New     PT LONG TERM GOAL #2   Title Pt will improve her R LE strength to grossly >/= -4/5 in order to improve gait and functional mobility.    Baseline grossly 3/5   Time 8   Period Weeks   Status New     PT LONG TERM GOAL #3   Title Pt will improve her L LE strength to >/= 3+/5 in order to improve gait and functional mobility.    Baseline grossly 2/5   Time 8   Period Weeks   Status New     PT LONG TERM GOAL #4   Title Pt will be able to demonstrate safe ambulation 300 feet on sidewalk surface and navigate single curb step without LOB with no assistive device.    Baseline Currently pt with LOB on level inside surface. Pt had to stabilize herself using her UE on the trunk of a car when navigating a curb.    Time 8   Status New               Plan - 09-28-2016 1127    Clinical Impression Statement Pt is an 82 year old female c/o Low back pain that has been on-going and worsening over the last 3 months. Pt reporting a fall that triggered her pain a few months ago. Pt reporting 5/10 pain at rest when lying or sitting and 10/10 pain with movements. Pt also reporting recent cortisone injection which only helped 2-3 days and pt beleives it caused her vertigo. Pt with decreased dynamic balance and funtional mobility with weakness in bilateral LE's (left worse than right).  Pt with pain with palpation over lumbar paraspinals, sacral musculature, and gluteals. Pt with decreased trunk mobility and functional movements due to pain. Skilled PT needed to address pt's impairments of decreased ROM, decreased functional mobiltiy, increased pain, decreased LE strength and decreased dynamic balance.    Rehab Potential Good   PT Frequency 2x / week   PT Duration 8 weeks   PT Treatment/Interventions Moist Heat;ADLs/Self Care Home Management;Electrical Stimulation;Iontophoresis 4mg /ml Dexamethasone;Functional mobility  training;Gait training;Stair training;Ultrasound;Traction;Therapeutic activities;Therapeutic exercise;Balance training;Neuromuscular re-education;Patient/family education;Passive range of motion;Manual techniques;Dry needling;Taping   PT Next Visit Plan Pt left her HEP in clinic. Review HEP and progress lumbar strengthening and stretching. LE strengthening exercises, Soft Tissue mobilizations, Modalities as needed   PT Home Exercise Plan Issued pt PPT, Bridges, Single Knee to chest   Consulted and Agree with Plan of Care Patient      Patient will benefit from skilled therapeutic intervention in order to improve the following deficits and impairments:  Postural dysfunction, Decreased strength, Decreased mobility, Decreased balance, Improper body mechanics, Pain, Decreased activity tolerance, Difficulty walking, Decreased range of motion, Decreased endurance  Visit Diagnosis: Midline low back pain without sciatica  Muscle weakness (generalized)  Difficulty in walking, not elsewhere classified      G-Codes - 2016/09/28 1147    Functional Assessment Tool Used FOTO, clinical assessment   Functional Limitation Mobility: Walking and moving around   Mobility: Walking and Moving Around Current Status [phone]) At least 60 percent but less than 80 percent impaired, limited or restricted   Mobility: Walking and Moving Around Goal Status [phone]) At least 40 percent but less than 60 percent impaired, limited or restricted       Problem List Patient Active Problem List   Diagnosis Date Noted  . Abnormal finding on imaging 04/22/2012  . Dyslipidemia 04/22/2012  . Depression  04/22/2012  . Anxiety 04/22/2012  . Tobacco abuse 04/22/2012  . COPD (chronic obstructive pulmonary disease) ([place]) 04/22/2012  . Gait abnormality 04/22/2012    Oretha Caprice, MPT  09/15/2016, 11:58 AM  [facility] [address] [place], [place],  [address] Phone: [phone]   Fax:  [phone]  Name: TROIAN BURRIS MRN: [id number] Date of Birth: 04/25/1932

## 2016-09-18 ENCOUNTER — Ambulatory Visit: Payer: Medicare Other | Attending: Family Medicine | Admitting: Physical Therapy

## 2016-09-18 ENCOUNTER — Encounter: Payer: Self-pay | Admitting: Physical Therapy

## 2016-09-18 DIAGNOSIS — M545 Low back pain, unspecified: Secondary | ICD-10-CM

## 2016-09-18 DIAGNOSIS — H8113 Benign paroxysmal vertigo, bilateral: Secondary | ICD-10-CM | POA: Diagnosis present

## 2016-09-18 DIAGNOSIS — R262 Difficulty in walking, not elsewhere classified: Secondary | ICD-10-CM | POA: Diagnosis present

## 2016-09-18 DIAGNOSIS — M6281 Muscle weakness (generalized): Secondary | ICD-10-CM | POA: Diagnosis present

## 2016-09-18 DIAGNOSIS — G8929 Other chronic pain: Secondary | ICD-10-CM | POA: Diagnosis present

## 2016-09-18 NOTE — Patient Instructions (Signed)
Get out of bed routine:  Begin with 1 min of breathing. Slowly breathe in & out but pay attention to relaxing the body!!  GENTLY bend your knees: Straighten out the right leg, keeping the left leg bent. Stretch the heel out, toes pull toward your chin, stretching the leg long. Breathe 3 times. The repeat on the left leg, bending the right knee. Do 2x on each leg.    Bend your knees: GENTLY rocking your thigh bones side to side, keep it small, NO PAIN. Do maybe 10-20 to loosen up.    Single knee to chest stretch: Pull the knee to your chest GENTLY, holding behind your knee.  Breathe 3 x slowly, relaxing the body. Repeat on the other leg. Do this 2x on each leg.

## 2016-09-18 NOTE — Therapy (Signed)
[facility] [address] [place], [place], [address] Phone: [phone]   Fax:  [phone]  Physical Therapy Treatment  Patient Details  Name: [name] MRN: [id number] Date of Birth: [birth year] Referring Provider: Theadore Nan, MD  Encounter Date: 09/18/2016      PT End of Session - 09/18/16 1236    Visit Number 2   Number of Visits 16   Date for PT Re-Evaluation 11/14/16   Authorization Type KX modifier at 15 visit   PT Start Time 1230  Had to leave to bring daughter to MD   PT Stop Time 1300   PT Time Calculation (min) 30 min   Activity Tolerance Patient tolerated treatment well   Behavior During Therapy Anxious  Slightly at hte beginning      Past Medical History:  Diagnosis Date  . Anxiety   . Depression   . Emphysema   . Lung nodule   . Osteomalacia   . Vitamin D deficiency     Past Surgical History:  Procedure Laterality Date  . APPENDECTOMY    . CARPAL TUNNEL RELEASE    . ROTATOR CUFF REPAIR      There were no vitals filed for this visit.      Subjective Assessment - 09/18/16 1232    Subjective Pt reports trying her HEP and it made her hurt per her report. Waking up in the morning is very painful, hard to get out of bed.    Currently in Pain? Yes   Pain Score 3    Pain Location Back   Pain Orientation Lower   Pain Descriptors / Indicators Dull;Aching   Aggravating Factors  Upon waking in the morning   Pain Relieving Factors Sitting, laying in bed   Multiple Pain Sites No                         [facility] Adult PT Treatment/Exercise - 09/18/16 0001      Lumbar Exercises: Stretches   Active Hamstring Stretch 2 reps;20 seconds  Seated   Single Knee to Chest Stretch 2 reps;10 seconds  VC to stretch easier   Lower Trunk Rotation --  Started gentle/small ROM      Lumbar Exercises: Aerobic   Stationary Bike Nustep L1 x 3 min     Lumbar Exercises: Supine   Other Supine Lumbar Exercises Decompression position with breathing x 2 min   Other Supine Lumbar Exercises Leg lengthener bil 2x                PT Education - 09/18/16 1250    Education provided Yes   Education Details get out of bed routine   Person(s) Educated Patient   Methods Explanation;Demonstration;Tactile cues;Verbal cues;Handout   Comprehension Verbalized understanding;Returned demonstration          PT Short Term Goals - 09/18/16 1237      PT SHORT TERM GOAL #1   Title Pt will be independent with her HEP to improve functional mobility.    Baseline Issued HEP at evalaution   Time 4   Period Weeks   Status On-going     PT SHORT TERM GOAL #2   Title Pt will report pain of <8/10 with functional movements.    Baseline Currently all movements pain is reported 10/10.    Period Weeks   Status On-going           PT Long Term Goals -  09/15/16 1138      PT LONG TERM GOAL #1   Title Pt will improve her FOTO from 67% limitation to 57% limitation.    Baseline 67% limitation   Time 8   Period Weeks   Status New     PT LONG TERM GOAL #2   Title Pt will improve her R LE strength to grossly >/= -4/5 in order to improve gait and functional mobility.    Baseline grossly 3/5   Time 8   Period Weeks   Status New     PT LONG TERM GOAL #3   Title Pt will improve her L LE strength to >/= 3+/5 in order to improve gait and functional mobility.    Baseline grossly 2/5   Time 8   Period Weeks   Status New     PT LONG TERM GOAL #4   Title Pt will be able to demonstrate safe ambulation 300 feet on sidewalk surface and navigate single curb step without LOB with no assistive device.    Baseline Currently pt with LOB on level inside surface. Pt had to stabilize herself using her UE on the trunk of a car when navigating a curb.    Time 8   Status New               Plan - 09/18/16 1236    Clinical Impression Statement Pt performed initial HEP with complaints  of increasing pain. She did this by memory so she possibly performeds the exs too much, incorrectly, or went too hard on her stretches.  She was nervous about resuming exercises today. We tackled getting out of bed with a little routine that should help her get out of bed with less pain. Pt reports she was more comfortable moving after her exercising today.  Only first visit so no goals met.    Rehab Potential Good   PT Frequency 2x / week   PT Duration 8 weeks   PT Treatment/Interventions Moist Heat;ADLs/Self Care Home Management;Electrical Stimulation;Iontophoresis 43m/ml Dexamethasone;Functional mobility training;Gait training;Stair training;Ultrasound;Traction;Therapeutic activities;Therapeutic exercise;Balance training;Neuromuscular re-education;Patient/family education;Passive range of motion;Manual techniques;Dry needling;Taping   PT Next Visit Plan Review getting out of bed routine, see if it helped. Gentle exercises/stretching, Nustep   Consulted and Agree with Plan of Care Patient      Patient will benefit from skilled therapeutic intervention in order to improve the following deficits and impairments:  Postural dysfunction, Decreased strength, Decreased mobility, Decreased balance, Improper body mechanics, Pain, Decreased activity tolerance, Difficulty walking, Decreased range of motion, Decreased endurance  Visit Diagnosis: Chronic midline low back pain without sciatica  Muscle weakness (generalized)  Difficulty in walking, not elsewhere classified     Problem List Patient Active Problem List   Diagnosis Date Noted  . Abnormal finding on imaging 04/22/2012  . Dyslipidemia 04/22/2012  . Depression 04/22/2012  . Anxiety 04/22/2012  . Tobacco abuse 04/22/2012  . COPD (chronic obstructive pulmonary disease) ([place] 04/22/2012  . Gait abnormality 04/22/2012    [name], PTA 09/18/2016, 1:03 PM  [facility] [address] [place] [address]Phone: [phone]  Fax:  [phone] Name: Kirsten COLESONMRN: [id number] of Birth: 9March 21, 1933  Moist heat applied to low back in supine.

## 2016-09-20 ENCOUNTER — Ambulatory Visit: Payer: Medicare Other | Admitting: Physical Therapy

## 2016-09-20 DIAGNOSIS — G8929 Other chronic pain: Secondary | ICD-10-CM

## 2016-09-20 DIAGNOSIS — H8113 Benign paroxysmal vertigo, bilateral: Secondary | ICD-10-CM

## 2016-09-20 DIAGNOSIS — R262 Difficulty in walking, not elsewhere classified: Secondary | ICD-10-CM

## 2016-09-20 DIAGNOSIS — M6281 Muscle weakness (generalized): Secondary | ICD-10-CM

## 2016-09-20 DIAGNOSIS — M545 Low back pain, unspecified: Secondary | ICD-10-CM

## 2016-09-20 NOTE — Therapy (Signed)
[facility] [address], [place], [place], [address] Phone: [phone]   Fax:  [phone]  Physical Therapy Treatment  Patient Details  Name: [name] MRN: [id number] Date of Birth: 02-06-1932 Referring Provider: Theadore Nan, MD  Encounter Date: 09/20/2016      PT End of Session - 09/20/16 1239    Visit Number 3   Number of Visits 16   Date for PT Re-Evaluation 11/14/16   Authorization Type KX modifier at 15 visit   PT Start Time 1146   PT Stop Time 1230   PT Time Calculation (min) 44 min   Activity Tolerance Patient tolerated treatment well   Behavior During Therapy [facility] for tasks assessed/performed      Past Medical History:  Diagnosis Date  . Anxiety   . Depression   . Emphysema   . Lung nodule   . Osteomalacia   . Vitamin D deficiency     Past Surgical History:  Procedure Laterality Date  . APPENDECTOMY    . CARPAL TUNNEL RELEASE    . ROTATOR CUFF REPAIR      There were no vitals filed for this visit.      Subjective Assessment - 09/20/16 1149    Subjective feels pretty good today.  almost didn't come. no pain today sitting but has pain when up walking.  reports that by doing those "2 little exercises" she can get out of bed without screaming   Limitations Lifting;Standing;Walking;House hold activities;Sitting   Patient Stated Goals Stop hurting in my back.    Currently in Pain? No/denies                Vestibular Assessment - 09/20/16 1156      Symptom Behavior   Type of Dizziness Imbalance   Frequency of Dizziness every time pt sits from supine   Duration of Dizziness minutes but comes and goes   Aggravating Factors Supine to sit;Turning head quickly   Relieving Factors Head stationary;Closing eyes;Rest;Slow movements     Occulomotor Exam   Occulomotor Alignment Normal   Spontaneous Absent   Smooth Pursuits Intact   Saccades Intact   Comment head thrust WNL      Vestibulo-Occular Reflex   VOR 1 Head Only (x 1 viewing) slight increase in symptoms     Positional Testing   Sidelying Test Sidelying Right;Sidelying Left   Horizontal Canal Testing Horizontal Canal Right;Horizontal Canal Left     Sidelying Right   Sidelying Right Duration none   Sidelying Right Symptoms No nystagmus     Sidelying Left   Sidelying Left Duration none   Sidelying Left Symptoms No nystagmus     Horizontal Canal Right   Horizontal Canal Right Duration 20 sec   Horizontal Canal Right Symptoms Other (comment);Nystagmus  upbeating torsional     Horizontal Canal Left   Horizontal Canal Left Duration 10 sec   Horizontal Canal Left Symptoms Other (comment);Nystagmus  upbeating torsional                 [facility] Adult PT Treatment/Exercise - 09/20/16 1151      Lumbar Exercises: Stretches   Lower Trunk Rotation --  10x5 sec; limited mobility     Lumbar Exercises: Aerobic   Stationary Bike Nustep L1 x 5 min     Lumbar Exercises: Supine   Clam 10 reps   Clam Limitations red theraband   Heel Slides 10 reps   Heel Slides Limitations bil  Bridge 3 seconds;10 reps   Bridge Limitations limited mobility         Vestibular Treatment/Exercise - 09/20/16 1215      Vestibular Treatment/Exercise   Vestibular Treatment Provided Canalith Repositioning   Canalith Repositioning Epley Manuever Right      EPLEY MANUEVER RIGHT   Number of Reps  1   Response Details  only performed x 1 rep; may need horizontal roll given nystagmus in horizontal canal testing                 PT Short Term Goals - 09/18/16 1237      PT SHORT TERM GOAL #1   Title Pt will be independent with her HEP to improve functional mobility.    Baseline Issued HEP at evalaution   Time 4   Period Weeks   Status On-going     PT SHORT TERM GOAL #2   Title Pt will report pain of <8/10 with functional movements.    Baseline Currently all movements pain is reported 10/10.     Period Weeks   Status On-going           PT Long Term Goals - 09/15/16 1138      PT LONG TERM GOAL #1   Title Pt will improve her FOTO from 67% limitation to 57% limitation.    Baseline 67% limitation   Time 8   Period Weeks   Status New     PT LONG TERM GOAL #2   Title Pt will improve her R LE strength to grossly >/= -4/5 in order to improve gait and functional mobility.    Baseline grossly 3/5   Time 8   Period Weeks   Status New     PT LONG TERM GOAL #3   Title Pt will improve her L LE strength to >/= 3+/5 in order to improve gait and functional mobility.    Baseline grossly 2/5   Time 8   Period Weeks   Status New     PT LONG TERM GOAL #4   Title Pt will be able to demonstrate safe ambulation 300 feet on sidewalk surface and navigate single curb step without LOB with no assistive device.    Baseline Currently pt with LOB on level inside surface. Pt had to stabilize herself using her UE on the trunk of a car when navigating a curb.    Time 8   Status New               Plan - 09/20/16 1240    Clinical Impression Statement Pt reported hx of vertigo and states symptoms have returned.  Vestibular assessment performed today revealed likely bil posterior canal BPPV (possibly cupulolithiasis) with torsional upbeating nystagmus.  Anatomy may be slightly off as nystagmus seen in horizontal roll test.  Treated R side today with R epley's given symptoms more severe however may need canalith repositioning for bil canals and may need canal roll as well as epley's.  Feel vertigo is likely multifactorial as well including medications and possibly orthostatic hypotension but will further assess this as needed.  Tolerated additional exercises well today.  Canalith repositioning and vestibular tx added to POC and recert sent to MD.   PT Treatment/Interventions Moist Heat;ADLs/Self Care Home Management;Electrical Stimulation;Iontophoresis 4mg /ml Dexamethasone;Functional mobility  training;Gait training;Stair training;Ultrasound;Traction;Therapeutic activities;Therapeutic exercise;Balance training;Neuromuscular re-education;Patient/family education;Passive range of motion;Manual techniques;Dry needling;Taping;Canalith Repostioning;Vestibular   PT Next Visit Plan Review getting out of bed routine, see if it helped. Gentle exercises/stretching, Nustep;  reassess vertigo as needed (may need to go to [facility] or Neuro for additional tx)   PT Home Exercise Plan Issued pt PPT, Brid[place], Single Knee to chest   Consulted and Agree with Plan of Care Patient      Patient will benefit from skilled therapeutic intervention in order to improve the following deficits and impairments:  Postural dysfunction, Decreased strength, Decreased mobility, Decreased balance, Improper body mechanics, Pain, Decreased activity tolerance, Difficulty walking, Decreased range of motion, Decreased endurance  Visit Diagnosis: Chronic midline low back pain without sciatica - Plan: PT plan of care cert/re-cert  Muscle weakness (generalized) - Plan: PT plan of care cert/re-cert  Difficulty in walking, not elsewhere classified - Plan: PT plan of care cert/re-cert  BPPV (benign paroxysmal positional vertigo), bilateral - Plan: PT plan of care cert/re-cert     Problem List Patient Active Problem List   Diagnosis Date Noted  . Abnormal finding on imaging 04/22/2012  . Dyslipidemia 04/22/2012  . Depression 04/22/2012  . Anxiety 04/22/2012  . Tobacco abuse 04/22/2012  . COPD (chronic obstructive pulmonary disease) ([place]) 04/22/2012  . Gait abnormality 04/22/2012       Laureen Abrahams, PT, DPT 09/20/16 12:49 PM    [facility] [address] [place], [place], [address] Phone: [phone]   Fax:  [phone]  Name: [name] MRN: [id number] Date of Birth: [birth year]

## 2016-09-27 ENCOUNTER — Ambulatory Visit: Payer: Medicare Other | Admitting: Physical Therapy

## 2016-09-27 ENCOUNTER — Encounter: Payer: Self-pay | Admitting: Physical Therapy

## 2016-09-27 DIAGNOSIS — M6281 Muscle weakness (generalized): Secondary | ICD-10-CM

## 2016-09-27 DIAGNOSIS — G8929 Other chronic pain: Secondary | ICD-10-CM

## 2016-09-27 DIAGNOSIS — M545 Low back pain, unspecified: Secondary | ICD-10-CM

## 2016-09-27 DIAGNOSIS — H8113 Benign paroxysmal vertigo, bilateral: Secondary | ICD-10-CM

## 2016-09-27 DIAGNOSIS — R262 Difficulty in walking, not elsewhere classified: Secondary | ICD-10-CM

## 2016-09-27 NOTE — Therapy (Signed)
[facility] [address] [place], [place], [address] Phone: [phone]   Fax:  [phone]  Physical Therapy Treatment  Patient Details  Name: [name] MRN: [id number] Date of Birth: November 12, 1932 Referring Provider: Theadore Nan, MD  Encounter Date: 09/27/2016      PT End of Session - 09/27/16 1404    Visit Number 4   Number of Visits 16   Date for PT Re-Evaluation 11/14/16   Authorization Type KX modifier at 15 visit   PT Start Time 1400   PT Stop Time 1439   PT Time Calculation (min) 39 min   Activity Tolerance Patient tolerated treatment well   Behavior During Therapy [facility] for tasks assessed/performed      Past Medical History:  Diagnosis Date  . Anxiety   . Depression   . Emphysema   . Lung nodule   . Osteomalacia   . Vitamin D deficiency     Past Surgical History:  Procedure Laterality Date  . APPENDECTOMY    . CARPAL TUNNEL RELEASE    . ROTATOR CUFF REPAIR      There were no vitals filed for this visit.      Subjective Assessment - 09/27/16 1403    Subjective Pt reports no pain in back. Responded well to vestibular treatment from last session but pt stated she's not sure if it helped after just one time.    Limitations Lifting;Standing;Walking;House hold activities;Sitting   How long can you sit comfortably? 20 minutes, depending on the chair surface   How long can you stand comfortably? 5 minutes   How long can you walk comfortably? 10 minutes   Currently in Pain? No/denies                         [facility] Adult PT Treatment/Exercise - 09/27/16 0001      Lumbar Exercises: Stretches   Active Hamstring Stretch 2 reps;20 seconds  Seated   Single Knee to Chest Stretch 2 reps;10 seconds  VC to stretch easier   Lower Trunk Rotation --  10x5 sec; limited mobility     Lumbar Exercises: Aerobic   Stationary Bike Nustep L1 x 6 min     Lumbar Exercises: Supine   Clam  10 reps   Clam Limitations red theraband   Heel Slides 10 reps   Bridge 3 seconds;10 reps   Bridge Limitations limited mobility     Knee/Hip Exercises: Standing   Hip Flexion Stengthening;Both;1 set;10 reps   Hip Abduction Stengthening;Both;1 set;10 reps   Hip Extension Stengthening;1 set;Both;10 reps     Knee/Hip Exercises: Supine   Hip Adduction Isometric Strengthening;Both;1 set;10 reps                  PT Short Term Goals - 09/18/16 1237      PT SHORT TERM GOAL #1   Title Pt will be independent with her HEP to improve functional mobility.    Baseline Issued HEP at evalaution   Time 4   Period Weeks   Status On-going     PT SHORT TERM GOAL #2   Title Pt will report pain of <8/10 with functional movements.    Baseline Currently all movements pain is reported 10/10.    Period Weeks   Status On-going           PT Long Term Goals - 09/15/16 1138      PT LONG TERM GOAL #1  Title Pt will improve her FOTO from 67% limitation to 57% limitation.    Baseline 67% limitation   Time 8   Period Weeks   Status New     PT LONG TERM GOAL #2   Title Pt will improve her R LE strength to grossly >/= -4/5 in order to improve gait and functional mobility.    Baseline grossly 3/5   Time 8   Period Weeks   Status New     PT LONG TERM GOAL #3   Title Pt will improve her L LE strength to >/= 3+/5 in order to improve gait and functional mobility.    Baseline grossly 2/5   Time 8   Period Weeks   Status New     PT LONG TERM GOAL #4   Title Pt will be able to demonstrate safe ambulation 300 feet on sidewalk surface and navigate single curb step without LOB with no assistive device.    Baseline Currently pt with LOB on level inside surface. Pt had to stabilize herself using her UE on the trunk of a car when navigating a curb.    Time 8   Status New               Plan - 09/27/16 1435    Clinical Impression Statement Pt reports no back pain. Has been able to  do home exercises in bed. Continues to have symptoms of vertigo that may be limiting progress with exercises due to dizzyness. Pt able to complete all exericses well with no increase in pain but some fatigue. Therapist monitoring for pain during treatment and standing near by for safety with standing exercises. Pt will continue to benefit from skilled therapy for core strengthening and stability.    Rehab Potential Good   PT Frequency 2x / week   PT Duration 8 weeks   PT Treatment/Interventions Moist Heat;Electrical Stimulation;Iontophoresis 4mg /ml Dexamethasone;Functional mobility training;Gait training;Stair training;Ultrasound;Traction;Therapeutic activities;Therapeutic exercise;Balance training;Neuromuscular re-education;Patient/family education;Passive range of motion;Manual techniques;Dry needling;Taping;Canalith Repostioning;Vestibular   PT Next Visit Plan Progress with standing exercises for core stability and balance   Consulted and Agree with Plan of Care Patient      Patient will benefit from skilled therapeutic intervention in order to improve the following deficits and impairments:  Postural dysfunction, Decreased strength, Decreased mobility, Decreased balance, Improper body mechanics, Pain, Decreased activity tolerance, Difficulty walking, Decreased range of motion, Decreased endurance  Visit Diagnosis: Chronic midline low back pain without sciatica  Muscle weakness (generalized)  Difficulty in walking, not elsewhere classified  BPPV (benign paroxysmal positional vertigo), bilateral     Problem List Patient Active Problem List   Diagnosis Date Noted  . Abnormal finding on imaging 04/22/2012  . Dyslipidemia 04/22/2012  . Depression 04/22/2012  . Anxiety 04/22/2012  . Tobacco abuse 04/22/2012  . COPD (chronic obstructive pulmonary disease) ([place]) 04/22/2012  . Gait abnormality 04/22/2012    Mikle Bosworth PTA 09/27/2016, 2:41 PM  [facility] [address] [place], [place], [address] Phone: [phone]   Fax:  [phone]  Name: [name] MRN: [id number] Date of Birth: 13-Oct-1932

## 2016-09-29 ENCOUNTER — Encounter: Payer: Self-pay | Admitting: Physical Therapy

## 2016-09-29 ENCOUNTER — Ambulatory Visit: Payer: Medicare Other | Admitting: Physical Therapy

## 2016-09-29 DIAGNOSIS — M545 Low back pain, unspecified: Secondary | ICD-10-CM

## 2016-09-29 DIAGNOSIS — R262 Difficulty in walking, not elsewhere classified: Secondary | ICD-10-CM

## 2016-09-29 DIAGNOSIS — H8113 Benign paroxysmal vertigo, bilateral: Secondary | ICD-10-CM

## 2016-09-29 DIAGNOSIS — G8929 Other chronic pain: Secondary | ICD-10-CM

## 2016-09-29 DIAGNOSIS — M6281 Muscle weakness (generalized): Secondary | ICD-10-CM

## 2016-09-29 NOTE — Therapy (Signed)
[facility] [address], [place], [place], [address] Phone: [phone]   Fax:  [phone]  Physical Therapy Treatment  Patient Details  Name: [name] MRN: [id number] Date of Birth: 1932/05/01 Referring Provider: Theadore Nan, MD  Encounter Date: 09/29/2016      PT End of Session - 09/29/16 0919    Visit Number 5   Number of Visits 16   Date for PT Re-Evaluation 11/14/16   Authorization Type KX modifier at 15 visit   PT Start Time 0846   PT Stop Time 0925   PT Time Calculation (min) 39 min   Activity Tolerance Patient tolerated treatment well   Behavior During Therapy [facility] for tasks assessed/performed      Past Medical History:  Diagnosis Date  . Anxiety   . Depression   . Emphysema   . Lung nodule   . Osteomalacia   . Vitamin D deficiency     Past Surgical History:  Procedure Laterality Date  . APPENDECTOMY    . CARPAL TUNNEL RELEASE    . ROTATOR CUFF REPAIR      There were no vitals filed for this visit.      Subjective Assessment - 09/29/16 0852    Subjective Pt reports her back feels "much better". She is still a tiny bit dizzy, but much better   Limitations Lifting;Standing;Walking;House hold activities;Sitting   Patient Stated Goals Stop hurting in my back.    Currently in Pain? Yes   Pain Score 4    Pain Location Back   Pain Orientation Lower   Pain Descriptors / Indicators Aching   Pain Type Chronic pain   Pain Onset More than a month ago   Multiple Pain Sites No                         [facility] Adult PT Treatment/Exercise - 09/29/16 0001      Lumbar Exercises: Stretches   Active Hamstring Stretch 2 reps;20 seconds   Single Knee to Chest Stretch 2 reps;20 seconds   Lower Trunk Rotation 60 seconds   Lower Trunk Rotation Limitations       Lumbar Exercises: Aerobic   Stationary Bike nustep level 2 x 6 minutes     Lumbar Exercises: Supine   Clam 20 reps    Clam Limitations red t band   Heel Slides 10 reps   Bridge 10 reps   Bridge Limitations limited mobility     Knee/Hip Exercises: Standing   Heel Raises Both;1 set;10 reps   Knee Flexion Strengthening;Both;2 sets;10 reps   Hip Flexion Stengthening;Both;2 sets;10 reps   Hip Abduction Stengthening;Both;2 sets;10 reps     Knee/Hip Exercises: Supine   Hip Adduction Isometric Strengthening;Both;1 set;10 reps                  PT Short Term Goals - 09/29/16 0854      PT SHORT TERM GOAL #1   Title Pt will be independent with her HEP to improve functional mobility.    Status On-going     PT SHORT TERM GOAL #2   Title Pt will report pain of <8/10 with functional movements.    Status Achieved  09/29/16           PT Long Term Goals - 09/29/16 0854      PT LONG TERM GOAL #1   Title Pt will improve her FOTO from 67% limitation to 57% limitation.  Status On-going     PT LONG TERM GOAL #2   Title Pt will improve her R LE strength to grossly >/= -4/5 in order to improve gait and functional mobility.    Status On-going     PT LONG TERM GOAL #3   Title Pt will improve her L LE strength to >/= 3+/5 in order to improve gait and functional mobility.    Status On-going     PT LONG TERM GOAL #4   Title Pt will be able to demonstrate safe ambulation 300 feet on sidewalk surface and navigate single curb step without LOB with no assistive device.    Status On-going               Plan - 09/29/16 0919    Clinical Impression Statement Pt has reduced back pain but continues with bilat hip and core weakness. Pt with slight dizziness and may benefit from more vestibular treatment. Pt able to perform increased standing therex today.   Rehab Potential Good   PT Frequency 2x / week   PT Duration 8 weeks   PT Treatment/Interventions Moist Heat;Electrical Stimulation;Iontophoresis 4mg /ml Dexamethasone;Functional mobility training;Gait training;Stair  training;Ultrasound;Traction;Therapeutic activities;Therapeutic exercise;Balance training;Neuromuscular re-education;Patient/family education;Passive range of motion;Manual techniques;Dry needling;Taping;Canalith Repostioning;Vestibular   PT Next Visit Plan continue standing exercise, core strength and balance   Consulted and Agree with Plan of Care Patient      Patient will benefit from skilled therapeutic intervention in order to improve the following deficits and impairments:  Postural dysfunction, Decreased strength, Decreased mobility, Decreased balance, Improper body mechanics, Pain, Decreased activity tolerance, Difficulty walking, Decreased range of motion, Decreased endurance  Visit Diagnosis: Chronic midline low back pain without sciatica  Muscle weakness (generalized)  Difficulty in walking, not elsewhere classified  BPPV (benign paroxysmal positional vertigo), bilateral     Problem List Patient Active Problem List   Diagnosis Date Noted  . Abnormal finding on imaging 04/22/2012  . Dyslipidemia 04/22/2012  . Depression 04/22/2012  . Anxiety 04/22/2012  . Tobacco abuse 04/22/2012  . COPD (chronic obstructive pulmonary disease) ([place]) 04/22/2012  . Gait abnormality 04/22/2012    Isabelle Course, PT, DPT 09/29/2016, 9:23 AM  [facility] [address], [place], [place], [address] Phone: [phone]   Fax:  [phone]  Name: [name] MRN: [id number] Date of Birth: 01/19/1932

## 2016-10-02 ENCOUNTER — Ambulatory Visit: Payer: Medicare Other | Admitting: Physical Therapy

## 2016-10-02 ENCOUNTER — Encounter: Payer: Self-pay | Admitting: Physical Therapy

## 2016-10-02 DIAGNOSIS — G8929 Other chronic pain: Secondary | ICD-10-CM

## 2016-10-02 DIAGNOSIS — M545 Low back pain, unspecified: Secondary | ICD-10-CM

## 2016-10-02 DIAGNOSIS — M6281 Muscle weakness (generalized): Secondary | ICD-10-CM

## 2016-10-02 NOTE — Therapy (Signed)
[facility] [address] [place], [place], [address] Phone: [phone]   Fax:  [phone]  Physical Therapy Treatment  Patient Details  Name: [name] MRN: [id number] Date of Birth: April 13, 1932 Referring Provider: Theadore Nan, MD  Encounter Date: 10/02/2016      PT End of Session - 10/02/16 1401    Visit Number 6   Number of Visits 16   Date for PT Re-Evaluation 11/14/16   Authorization Type KX modifier at 15 visit   PT Start Time 6256   PT Stop Time 1435   PT Time Calculation (min) 38 min   Activity Tolerance Patient tolerated treatment well   Behavior During Therapy [facility] for tasks assessed/performed      Past Medical History:  Diagnosis Date  . Anxiety   . Depression   . Emphysema   . Lung nodule   . Osteomalacia   . Vitamin D deficiency     Past Surgical History:  Procedure Laterality Date  . APPENDECTOMY    . CARPAL TUNNEL RELEASE    . ROTATOR CUFF REPAIR      There were no vitals filed for this visit.      Subjective Assessment - 10/02/16 1400    Subjective I still have dizziness especially when I turn/roll in bed.    Currently in Pain? No/denies   Multiple Pain Sites No                         [facility] Adult PT Treatment/Exercise - 10/02/16 0001      Lumbar Exercises: Stretches   Single Knee to Chest Stretch 2 reps;20 seconds   Lower Trunk Rotation 60 seconds     Lumbar Exercises: Aerobic   Stationary Bike Nustep L2 x 10 min     Lumbar Exercises: Supine   Clam 20 reps   Clam Limitations red t band   Heel Slides 15 reps   Bridge 10 reps   Bridge Limitations limited mobility   Other Supine Lumbar Exercises Ball squeeze with Pilates Breath 10x     Knee/Hip Exercises: Standing   Heel Raises Both;2 sets;10 reps   Hip Flexion Stengthening;Both;2 sets;10 reps   Hip Abduction Stengthening;Both;2 sets;10 reps     Knee/Hip Exercises: Seated   Long Arc Quad  Strengthening;Both;1 set;10 reps;Weights  with ball squeeze   Long Arc Quad Weight 1 lbs.   Sit to Sand 5 reps;without UE support                  PT Short Term Goals - 10/02/16 1407      PT SHORT TERM GOAL #1   Title Pt will be independent with her HEP to improve functional mobility.    Period Weeks   Status Achieved           PT Long Term Goals - 09/29/16 0854      PT LONG TERM GOAL #1   Title Pt will improve her FOTO from 67% limitation to 57% limitation.    Status On-going     PT LONG TERM GOAL #2   Title Pt will improve her R LE strength to grossly >/= -4/5 in order to improve gait and functional mobility.    Status On-going     PT LONG TERM GOAL #3   Title Pt will improve her L LE strength to >/= 3+/5 in order to improve gait and functional mobility.    Status On-going  PT LONG TERM GOAL #4   Title Pt will be able to demonstrate safe ambulation 300 feet on sidewalk surface and navigate single curb step without LOB with no assistive device.    Status On-going               Plan - 10/02/16 1402    Clinical Impression Statement Pt met all short term goals as of today. She reports 50% overall improvements with movement and 20% less LBP. Pt needs extra time secondary to dizziness with transitional movements especially supine to sitting.    Rehab Potential Good   PT Frequency 2x / week   PT Duration 8 weeks   PT Treatment/Interventions Moist Heat;Electrical Stimulation;Iontophoresis 23m/ml Dexamethasone;Functional mobility training;Gait training;Stair training;Ultrasound;Traction;Therapeutic activities;Therapeutic exercise;Balance training;Neuromuscular re-education;Patient/family education;Passive range of motion;Manual techniques;Dry needling;Taping;Canalith Repostioning;Vestibular   PT Next Visit Plan continue standing exercise, core strength and balance, allow for pt to have extra time with transitional movements secondary to dizziness.     Consulted and Agree with Plan of Care --      Patient will benefit from skilled therapeutic intervention in order to improve the following deficits and impairments:  Postural dysfunction, Decreased strength, Decreased mobility, Decreased balance, Improper body mechanics, Pain, Decreased activity tolerance, Difficulty walking, Decreased range of motion, Decreased endurance  Visit Diagnosis: Chronic midline low back pain without sciatica  Muscle weakness (generalized)     Problem List Patient Active Problem List   Diagnosis Date Noted  . Abnormal finding on imaging 04/22/2012  . Dyslipidemia 04/22/2012  . Depression 04/22/2012  . Anxiety 04/22/2012  . Tobacco abuse 04/22/2012  . COPD (chronic obstructive pulmonary disease) ([place] 04/22/2012  . Gait abnormality 04/22/2012    [name], PTA 10/02/2016, 2:33 PM  [facility] [address] [place] [address]: [phone]  Fax:  [phone] Name: FSHELDON AMARAMRN: [id number] of Birth: 905-Dec-1933

## 2016-10-04 ENCOUNTER — Ambulatory Visit: Payer: Medicare Other | Admitting: Physical Therapy

## 2016-10-04 ENCOUNTER — Encounter: Payer: Self-pay | Admitting: Physical Therapy

## 2016-10-04 DIAGNOSIS — M6281 Muscle weakness (generalized): Secondary | ICD-10-CM

## 2016-10-04 DIAGNOSIS — G8929 Other chronic pain: Secondary | ICD-10-CM

## 2016-10-04 DIAGNOSIS — M545 Low back pain, unspecified: Secondary | ICD-10-CM

## 2016-10-04 NOTE — Therapy (Signed)
[facility] [address] [place], [place], [address] Phone: [phone]   Fax:  [phone]  Physical Therapy Treatment  Patient Details  Name: [name] MRN: [id number] Date of Birth: 1932/11/15 Referring Provider: Theadore Nan, MD  Encounter Date: 10/04/2016      PT End of Session - 10/04/16 1403    Visit Number 7   Number of Visits 16   Date for PT Re-Evaluation 11/14/16   Authorization Type KX modifier at 15 visit   PT Start Time 1400   PT Stop Time 1438   PT Time Calculation (min) 38 min   Activity Tolerance Patient tolerated treatment well   Behavior During Therapy [facility] for tasks assessed/performed      Past Medical History:  Diagnosis Date  . Anxiety   . Depression   . Emphysema   . Lung nodule   . Osteomalacia   . Vitamin D deficiency     Past Surgical History:  Procedure Laterality Date  . APPENDECTOMY    . CARPAL TUNNEL RELEASE    . ROTATOR CUFF REPAIR      There were no vitals filed for this visit.      Subjective Assessment - 10/04/16 1403    Subjective I felt fine after my last treatment.   Currently in Pain? No/denies   Multiple Pain Sites No                         [facility] Adult PT Treatment/Exercise - 10/04/16 0001      Lumbar Exercises: Stretches   Single Knee to Chest Stretch 2 reps;20 seconds   Lower Trunk Rotation 60 seconds     Lumbar Exercises: Aerobic   Stationary Bike L2 x 10 min     Lumbar Exercises: Supine   Bridge 10 reps  with ball squeeze     Knee/Hip Exercises: Standing   Heel Raises Both;2 sets;10 reps   Hip Flexion Stengthening;Both;2 sets;10 reps  1# added   Hip Abduction Stengthening;Both;2 sets;10 reps  1# added     Knee/Hip Exercises: Seated   Long Arc Quad Strengthening;Both;2 sets;10 reps   Long Arc Quad Weight 1 lbs.   Clamshell with TheraBand Red  2x10 with pillow for low back support   Sit to Sand 10 reps;without  UE support                  PT Short Term Goals - 10/02/16 1407      PT SHORT TERM GOAL #1   Title Pt will be independent with her HEP to improve functional mobility.    Period Weeks   Status Achieved           PT Long Term Goals - 09/29/16 0854      PT LONG TERM GOAL #1   Title Pt will improve her FOTO from 67% limitation to 57% limitation.    Status On-going     PT LONG TERM GOAL #2   Title Pt will improve her R LE strength to grossly >/= -4/5 in order to improve gait and functional mobility.    Status On-going     PT LONG TERM GOAL #3   Title Pt will improve her L LE strength to >/= 3+/5 in order to improve gait and functional mobility.    Status On-going     PT LONG TERM GOAL #4   Title Pt will be able to demonstrate safe ambulation 300  feet on sidewalk surface and navigate single curb step without LOB with no assistive device.    Status On-going               Plan - 10/04/16 1404    Clinical Impression Statement Pt doing well with addition of light ankle weights for her leg strengthening exercises. Still remains dizzy with transitional movements involving rotation.     Rehab Potential Good   PT Frequency 2x / week   PT Duration 8 weeks   PT Treatment/Interventions Moist Heat;Electrical Stimulation;Iontophoresis 4mg /ml Dexamethasone;Functional mobility training;Gait training;Stair training;Ultrasound;Traction;Therapeutic activities;Therapeutic exercise;Balance training;Neuromuscular re-education;Patient/family education;Passive range of motion;Manual techniques;Dry needling;Taping;Canalith Repostioning;Vestibular   PT Next Visit Plan continue standing exercise, core strength and balance, allow for pt to have extra time with transitional movements secondary to dizziness.    Consulted and Agree with Plan of Care Patient      Patient will benefit from skilled therapeutic intervention in order to improve the following deficits and impairments:  Postural  dysfunction, Decreased strength, Decreased mobility, Decreased balance, Improper body mechanics, Pain, Decreased activity tolerance, Difficulty walking, Decreased range of motion, Decreased endurance  Visit Diagnosis: Chronic midline low back pain without sciatica  Muscle weakness (generalized)     Problem List Patient Active Problem List   Diagnosis Date Noted  . Abnormal finding on imaging 04/22/2012  . Dyslipidemia 04/22/2012  . Depression 04/22/2012  . Anxiety 04/22/2012  . Tobacco abuse 04/22/2012  . COPD (chronic obstructive pulmonary disease) ([place]) 04/22/2012  . Gait abnormality 04/22/2012    COCHRAN,JENNIFER, PTA 10/04/2016, 2:37 PM  [facility] [address], [place], [place], [address] Phone: [phone]   Fax:  [phone]  Name: [name] MRN: [id number] Date of Birth: [birth year]

## 2016-10-09 ENCOUNTER — Ambulatory Visit: Payer: Medicare Other | Admitting: Physical Therapy

## 2016-10-09 ENCOUNTER — Encounter: Payer: Self-pay | Admitting: Physical Therapy

## 2016-10-09 DIAGNOSIS — M545 Low back pain, unspecified: Secondary | ICD-10-CM

## 2016-10-09 DIAGNOSIS — G8929 Other chronic pain: Secondary | ICD-10-CM

## 2016-10-09 DIAGNOSIS — M6281 Muscle weakness (generalized): Secondary | ICD-10-CM

## 2016-10-09 NOTE — Therapy (Signed)
[facility] [address], [place], [place], [address] Phone: [phone]   Fax:  [phone]  Physical Therapy Treatment  Patient Details  Name: [name] MRN: [id number] Date of Birth: May 10, 1932 Referring Provider: Theadore Nan, MD  Encounter Date: 10/09/2016      PT End of Session - 10/09/16 1404    Visit Number 8   Number of Visits 16   Date for PT Re-Evaluation 11/14/16   Authorization Type KX modifier at 15 visit   PT Start Time 1355   PT Stop Time 1450   PT Time Calculation (min) 55 min   Activity Tolerance Patient limited by pain   Behavior During Therapy [facility] for tasks assessed/performed      Past Medical History:  Diagnosis Date  . Anxiety   . Depression   . Emphysema   . Lung nodule   . Osteomalacia   . Vitamin D deficiency     Past Surgical History:  Procedure Laterality Date  . APPENDECTOMY    . CARPAL TUNNEL RELEASE    . ROTATOR CUFF REPAIR      There were no vitals filed for this visit.      Subjective Assessment - 10/09/16 1403    Subjective Dizziness is off and on, medicine helps. One spot on her right mid back grabs at her.   Currently in Pain? No/denies   Multiple Pain Sites No                         [facility] Adult PT Treatment/Exercise - 10/09/16 0001      Lumbar Exercises: Stretches   Single Knee to Chest Stretch 2 reps;20 seconds   Lower Trunk Rotation 60 seconds  Knees bent up on the blue wedge     Lumbar Exercises: Aerobic   Stationary Bike L2 x 10 min  Concurrent review of status     Knee/Hip Exercises: Seated   Long Arc Quad Strengthening;Both;2 sets;10 reps   Long Arc Quad Weight 1 lbs.   Ball Squeeze Tried but stopped bc of pain   Clamshell with TheraBand Red  2x10 with pillow for low back support   Knee/Hip Flexion 1# 10x alternating   Sit to Sand 10 reps;without UE support                  PT Short Term Goals - 10/02/16  1407      PT SHORT TERM GOAL #1   Title Pt will be independent with her HEP to improve functional mobility.    Period Weeks   Status Achieved           PT Long Term Goals - 09/29/16 0854      PT LONG TERM GOAL #1   Title Pt will improve her FOTO from 67% limitation to 57% limitation.    Status On-going     PT LONG TERM GOAL #2   Title Pt will improve her R LE strength to grossly >/= -4/5 in order to improve gait and functional mobility.    Status On-going     PT LONG TERM GOAL #3   Title Pt will improve her L LE strength to >/= 3+/5 in order to improve gait and functional mobility.    Status On-going     PT LONG TERM GOAL #4   Title Pt will be able to demonstrate safe ambulation 300 feet on sidewalk surface and navigate single curb step without  LOB with no assistive device.    Status On-going               Plan - 10/09/16 1405    Clinical Impression Statement Pt had increasing back pain with standing exercises. Applied MHP to lumbar spine in supine for stretches. Stretches were mildly helpfyl that we ended the session to let her lie on the heat in hooklying.  Did not ldo MMTs for goal treview today due to pain increased through seesion.    PT Frequency 2x / week   PT Duration 8 weeks   PT Treatment/Interventions Moist Heat;Electrical Stimulation;Iontophoresis 4mg /ml Dexamethasone;Functional mobility training;Gait training;Stair training;Ultrasound;Traction;Therapeutic activities;Therapeutic exercise;Balance training;Neuromuscular re-education;Patient/family education;Passive range of motion;Manual techniques;Dry needling;Taping;Canalith Repostioning;Vestibular   PT Next Visit Plan continue standing exercise, core strength and balance, allow for pt to have extra time with transitional movements secondary to dizziness.    Consulted and Agree with Plan of Care Patient      Patient will benefit from skilled therapeutic intervention in order to improve the following  deficits and impairments:  Postural dysfunction, Decreased strength, Decreased mobility, Decreased balance, Improper body mechanics, Pain, Decreased activity tolerance, Difficulty walking, Decreased range of motion, Decreased endurance, Increased muscle spasms  Visit Diagnosis: Muscle weakness (generalized)  Chronic midline low back pain without sciatica     Problem List Patient Active Problem List   Diagnosis Date Noted  . Abnormal finding on imaging 04/22/2012  . Dyslipidemia 04/22/2012  . Depression 04/22/2012  . Anxiety 04/22/2012  . Tobacco abuse 04/22/2012  . COPD (chronic obstructive pulmonary disease) ([place]) 04/22/2012  . Gait abnormality 04/22/2012    [place], PTA 10/09/2016, 2:34 PM  [facility] [address] [place], [place], [address] Phone: [phone]   Fax:  [phone]  Name: [name] MRN: [id number] Date of Birth: 09/20/1932

## 2016-10-11 ENCOUNTER — Ambulatory Visit: Payer: Medicare Other | Admitting: Physical Therapy

## 2016-10-12 ENCOUNTER — Ambulatory Visit: Payer: Medicare Other | Admitting: Physical Therapy

## 2016-10-12 DIAGNOSIS — R262 Difficulty in walking, not elsewhere classified: Secondary | ICD-10-CM

## 2016-10-12 DIAGNOSIS — M545 Low back pain, unspecified: Secondary | ICD-10-CM

## 2016-10-12 DIAGNOSIS — M6281 Muscle weakness (generalized): Secondary | ICD-10-CM

## 2016-10-12 DIAGNOSIS — G8929 Other chronic pain: Secondary | ICD-10-CM

## 2016-10-12 NOTE — Therapy (Signed)
[facility] [address], [place] [employer], [place], [address] Phone: [phone]   Fax:  [phone]  Physical Therapy Treatment  Patient Details  Name: [name] MRN: [id number] Date of Birth: 06-10-1932 Referring Provider: Theadore Nan, MD  Encounter Date: 10/12/2016      PT End of Session - 10/12/16 1218    Visit Number 9   Number of Visits 16   Date for PT Re-Evaluation 11/14/16   Authorization Type KX modifier at 15 visit   PT Start Time 1208   PT Stop Time 1248   PT Time Calculation (min) 40 min   Activity Tolerance Patient tolerated treatment well   Behavior During Therapy [facility] for tasks assessed/performed      Past Medical History:  Diagnosis Date  . Anxiety   . Depression   . Emphysema   . Lung nodule   . Osteomalacia   . Vitamin D deficiency     Past Surgical History:  Procedure Laterality Date  . APPENDECTOMY    . CARPAL TUNNEL RELEASE    . ROTATOR CUFF REPAIR      There were no vitals filed for this visit.      Subjective Assessment - 10/12/16 1210    Subjective Pt reporting episodes of vertigo. Pt reporting her medicine seems to help at times. Pt reporting pain of 5-10/10 with movements, pt reporting 8-9/10 with getting up and down out of her bed. pt reporting no pain at rest.    Limitations Lifting;Standing;Walking;House hold activities;Sitting   How long can you sit comfortably? 20 minutes, depending on the chair surface   How long can you stand comfortably? 5 minutes   How long can you walk comfortably? 15 minutes   Patient Stated Goals Stop hurting in my back.    Currently in Pain? Yes   Pain Score 5   with movements   Pain Location Back   Pain Orientation Lower   Pain Descriptors / Indicators Aching   Pain Onset More than a month ago   Pain Frequency Constant   Aggravating Factors  getting on/off of bed   Pain Relieving Factors sitting, lying in bed   Effect of Pain on Daily  Activities pt reporting difficulty with household activities   Multiple Pain Sites No                         [facility] Adult PT Treatment/Exercise - 10/12/16 0001      Lumbar Exercises: Stretches   Passive Hamstring Stretch 2 reps;30 seconds   Single Knee to Chest Stretch 2 reps;20 seconds   Lower Trunk Rotation 60 seconds  Knees bent up on the blue wedge     Lumbar Exercises: Aerobic   Stationary Bike L2 10 minutes Nustep     Lumbar Exercises: Supine   Bridge 10 reps   Bridge Limitations limited lift off     Knee/Hip Exercises: Standing   Heel Raises 15 reps   Hip Flexion Stengthening;15 reps   Hip Abduction 10 reps     Knee/Hip Exercises: Seated   Sit to Sand 10 reps;without UE support                PT Education - 10/12/16 1234    Education provided Yes   Education Details supine to sit, discussed progress with LE strength and importance of continued exercises.    Person(s) Educated Patient   Methods Explanation;Demonstration;Tactile cues;Verbal cues   Comprehension  Verbalized understanding;Returned demonstration          PT Short Term Goals - 10/12/16 1225      PT SHORT TERM GOAL #1   Title Pt will be independent with her HEP to improve functional mobility.    Baseline Issued HEP at evalaution   Time 4   Period Weeks   Status Achieved     PT SHORT TERM GOAL #2   Title Pt will report pain of <8/10 with functional movements.    Baseline Pt reporting pain ranging from 5-10/10 with activities   Time 4   Period Weeks   Status Partially Met           PT Long Term Goals - 10/12/16 1227      PT LONG TERM GOAL #1   Title Pt will improve her FOTO from 67% limitation to 57% limitation.    Baseline 67% limitation   Time 8   Period Weeks   Status On-going     PT LONG TERM GOAL #2   Title Pt will improve her R LE strength to grossly >/= -4/5 in order to improve gait and functional mobility.    Baseline bilateral knee flexion and  extension grossly 5/5, hip flexion and  Hip abduction 4-/5.    Time 8   Period Weeks   Status Partially Met     PT LONG TERM GOAL #3   Title Pt will improve her L LE strength to >/= 3+/5 in order to improve gait and functional mobility.    Baseline bilateral knee flexion and extension grossly 5/5, hip flexion and  Hip abduction 4-/5.    Time 8   Period Weeks   Status Achieved     PT LONG TERM GOAL #4   Title Pt will be able to demonstrate safe ambulation 300 feet on sidewalk surface and navigate single curb step without LOB with no assistive device.    Baseline Currently pt with LOB on level inside surface. Pt had to stabilize herself using her UE on the trunk of a car when navigating a curb.    Time 8   Period Weeks   Status On-going               Plan - 10/12/16 1220    Clinical Impression Statement Pt reporting episodes of vertigo which is usually with transitions. Pt reporting no pain at rest and 5/10 pain with movement today. Pt reporting doing her exercises intermittently. Pt with improved LE strength still progressing toward LTG's. pt limited with standing exewrcises due to muscle fatigue. skilled PT needed to progress pt toward her goals set.    Rehab Potential Good   PT Frequency 2x / week   PT Duration 8 weeks   PT Treatment/Interventions Moist Heat;Electrical Stimulation;Iontophoresis '4mg'$ /ml Dexamethasone;Functional mobility training;Gait training;Stair training;Ultrasound;Traction;Therapeutic activities;Therapeutic exercise;Balance training;Neuromuscular re-education;Patient/family education;Passive range of motion;Manual techniques;Dry needling;Taping;Canalith Repostioning;Vestibular   PT Next Visit Plan continue standing exercise, core strength and balance, allow for pt to have extra time with transitional movements secondary to dizziness.    PT Home Exercise Plan Issued pt PPT, Bridges, Single Knee to chest      Patient will benefit from skilled therapeutic  intervention in order to improve the following deficits and impairments:     Visit Diagnosis: Muscle weakness (generalized)  Chronic midline low back pain without sciatica  Difficulty in walking, not elsewhere classified     Problem List Patient Active Problem List   Diagnosis Date Noted  . Abnormal  finding on imaging 04/22/2012  . Dyslipidemia 04/22/2012  . Depression 04/22/2012  . Anxiety 04/22/2012  . Tobacco abuse 04/22/2012  . COPD (chronic obstructive pulmonary disease) ([place]) 04/22/2012  . Gait abnormality 04/22/2012    Oretha Caprice, MPT  10/12/2016, 12:48 PM  [facility] [address], [place], [place], [address] Phone: [phone]   Fax:  [phone]  Name: [name] MRN: [id number] Date of Birth: [birth year]

## 2016-10-16 ENCOUNTER — Ambulatory Visit: Payer: Medicare Other | Admitting: Physical Therapy

## 2016-10-16 ENCOUNTER — Encounter: Payer: Self-pay | Admitting: Physical Therapy

## 2016-10-16 DIAGNOSIS — M6281 Muscle weakness (generalized): Secondary | ICD-10-CM

## 2016-10-16 DIAGNOSIS — M545 Low back pain, unspecified: Secondary | ICD-10-CM

## 2016-10-16 DIAGNOSIS — G8929 Other chronic pain: Secondary | ICD-10-CM

## 2016-10-16 NOTE — Therapy (Signed)
[facility] [address] [name], [place], [address] Phone: [phone]   Fax:  [phone]  Physical Therapy Treatment  Patient Details  Name: [name] MRN: [id number] Date of Birth: 24-Jul-1932 Referring Provider: Theadore Nan, MD  Encounter Date: 10/16/2016      PT End of Session - 10/16/16 1357    Visit Number 10   Number of Visits 16   Date for PT Re-Evaluation 11/14/16   Authorization Type KX modifier at 15 visit   PT Start Time 1356   PT Stop Time 1455   PT Time Calculation (min) 59 min   Activity Tolerance Patient tolerated treatment well   Behavior During Therapy [facility] for tasks assessed/performed      Past Medical History:  Diagnosis Date  . Anxiety   . Depression   . Emphysema   . Lung nodule   . Osteomalacia   . Vitamin D deficiency     Past Surgical History:  Procedure Laterality Date  . APPENDECTOMY    . CARPAL TUNNEL RELEASE    . ROTATOR CUFF REPAIR      There were no vitals filed for this visit.      Subjective Assessment - 10/16/16 1409    Subjective Wants to get MRI on her brain. Plans to speak with MD about.    Currently in Pain? --  None at rest. pain when she moves. So none at this momnet.    Multiple Pain Sites No            [facility] PT Assessment - 10/16/16 0001      Observation/Other Assessments   Focus on Therapeutic Outcomes (FOTO)  67% limitation CL Goal is CK                     [facility] Adult PT Treatment/Exercise - 10/16/16 0001      Lumbar Exercises: Aerobic   Stationary Bike Nustep L2 x 10 min     Modalities   Modalities Moist Heat     Moist Heat Therapy   Number Minutes Moist Heat 15 Minutes   Moist Heat Location Lumbar Spine  LT sidelying with pillows bt knees to feet     Electrical Stimulation   Electrical Stimulation Location RT lower lumbar   Electrical Stimulation Action IFC   Electrical Stimulation Parameters Lt sidelying  with pillows 15 min   Electrical Stimulation Goals Pain     Manual Therapy   Manual Therapy Soft tissue mobilization   Soft tissue mobilization Rt lower lumbar, proximal gluteal                  PT Short Term Goals - 10/16/16 1412      PT SHORT TERM GOAL #1   Title Pt will be independent with her HEP to improve functional mobility.    Time 4   Period Weeks   Status Achieved     PT SHORT TERM GOAL #2   Title Pt will report pain of <8/10 with functional movements.    Time 4   Period Weeks   Status Achieved  5/10           PT Long Term Goals - 10/16/16 1413      PT LONG TERM GOAL #1   Title Pt will improve her FOTO from 67% limitation to 57% limitation.    Baseline 67% limitation   Time 8   Period Weeks   Status On-going  still  67% limittaion     PT LONG TERM GOAL #3   Title Pt will improve her L LE strength to >/= 3+/5 in order to improve gait and functional mobility.    Time 8   Period Weeks   Status Achieved     PT LONG TERM GOAL #4   Title Pt will be able to demonstrate safe ambulation 300 feet on sidewalk surface and navigate single curb step without LOB with no assistive device.    Baseline Currently pt with LOB on level inside surface. Pt had to stabilize herself using her UE on the trunk of a car when navigating a curb.    Time 8   Period Weeks   Status On-going               Plan - November 15, 2016 1436    Clinical Impression Statement Pt's FOTO score exactly the same as it was on eval. Today pt requested we do soft tissue work to her low back. The RT lower lumbar did have active trigger points that were very painful. Tightness also went into the proximal gluteals but no trigger points noted there.    Rehab Potential Good   PT Frequency 2x / week   PT Duration 8 weeks   PT Treatment/Interventions Moist Heat;Electrical Stimulation;Iontophoresis 4mg /ml Dexamethasone;Functional mobility training;Gait training;Stair  training;Ultrasound;Traction;Therapeutic activities;Therapeutic exercise;Balance training;Neuromuscular re-education;Patient/family education;Passive range of motion;Manual techniques;Dry needling;Taping;Canalith Repostioning;Vestibular   PT Next Visit Plan Soft tissue work to lower RT lumbar   Consulted and Agree with Plan of Care Patient      Patient will benefit from skilled therapeutic intervention in order to improve the following deficits and impairments:  Postural dysfunction, Decreased strength, Decreased mobility, Decreased balance, Improper body mechanics, Pain, Decreased activity tolerance, Difficulty walking, Decreased range of motion, Decreased endurance, Increased muscle spasms  Visit Diagnosis: Muscle weakness (generalized)  Chronic midline low back pain without sciatica       G-Codes - Nov 15, 2016 1525    Functional Assessment Tool Used FOTO, clinical assessment   Functional Limitation Mobility: Walking and moving around   Mobility: Walking and Moving Around Current Status [phone]) At least 60 percent but less than 80 percent impaired, limited or restricted   Mobility: Walking and Moving Around Goal Status [phone]) At least 40 percent but less than 60 percent impaired, limited or restricted      Problem List Patient Active Problem List   Diagnosis Date Noted  . Abnormal finding on imaging 04/22/2012  . Dyslipidemia 04/22/2012  . Depression 04/22/2012  . Anxiety 04/22/2012  . Tobacco abuse 04/22/2012  . COPD (chronic obstructive pulmonary disease) ([place]) 04/22/2012  . Gait abnormality 04/22/2012   [name], PTA Nov 15, 2016 3:26 PM   [name], MPT  [facility] [address] [place], [place], [address] Phone: [phone]   Fax:  [phone]  Name: [name] MRN: [id number] Date of Birth: 07/06/1932

## 2016-10-18 ENCOUNTER — Ambulatory Visit: Payer: Medicare Other | Attending: Family Medicine | Admitting: Physical Therapy

## 2016-10-18 ENCOUNTER — Encounter: Payer: Self-pay | Admitting: Physical Therapy

## 2016-10-18 ENCOUNTER — Encounter: Payer: Self-pay | Admitting: Family

## 2016-10-18 DIAGNOSIS — R262 Difficulty in walking, not elsewhere classified: Secondary | ICD-10-CM | POA: Insufficient documentation

## 2016-10-18 DIAGNOSIS — G8929 Other chronic pain: Secondary | ICD-10-CM | POA: Diagnosis present

## 2016-10-18 DIAGNOSIS — M6281 Muscle weakness (generalized): Secondary | ICD-10-CM | POA: Diagnosis present

## 2016-10-18 DIAGNOSIS — M545 Low back pain, unspecified: Secondary | ICD-10-CM

## 2016-10-18 NOTE — Therapy (Signed)
[facility] [address] [place], [place], [address] Phone: [phone]   Fax:  [phone]  Physical Therapy Treatment  Patient Details  Name: [name] MRN: [id number] Date of Birth: February 19, 1932 Referring Provider: Theadore Nan, MD  Encounter Date: 10/18/2016      PT End of Session - 10/18/16 1405    Visit Number 11   Number of Visits 16   Date for PT Re-Evaluation 11/14/16   Authorization Type KX modifier at 15 visit   PT Start Time 1356   PT Stop Time 1445   PT Time Calculation (min) 49 min   Activity Tolerance Patient tolerated treatment well   Behavior During Therapy [facility] for tasks assessed/performed      Past Medical History:  Diagnosis Date  . Anxiety   . Depression   . Emphysema   . Lung nodule   . Osteomalacia   . Vitamin D deficiency     Past Surgical History:  Procedure Laterality Date  . APPENDECTOMY    . CARPAL TUNNEL RELEASE    . ROTATOR CUFF REPAIR      There were no vitals filed for this visit.      Subjective Assessment - 10/18/16 1402    Subjective Continues with dizziness, had soreness in her low back after soft tissue work but her back felt much better after the soreness wore off and has not "caught" since the last treatment. Pt needs to leave right at 2:45 for another appt.    Currently in Pain? Yes   Pain Score 5    Pain Location Back   Pain Orientation Lower   Aggravating Factors  Some movements   Pain Relieving Factors The massage helped a lot   Multiple Pain Sites No                         [facility] Adult PT Treatment/Exercise - 10/18/16 0001      Lumbar Exercises: Aerobic   Stationary Bike L2 x 6 min     Modalities   Modalities Moist Heat     Moist Heat Therapy   Number Minutes Moist Heat 15 Minutes   Moist Heat Location Lumbar Spine  LT sidelying with pillows bt knees to feet     Electrical Stimulation   Electrical Stimulation Location  RT lower lumbar   Electrical Stimulation Action IFC   Electrical Stimulation Parameters Sidelying with pillows   Electrical Stimulation Goals Pain     Manual Therapy   Manual Therapy Soft tissue mobilization   Soft tissue mobilization Rt lower lumbar, proximal gluteal                  PT Short Term Goals - 10/16/16 1412      PT SHORT TERM GOAL #1   Title Pt will be independent with her HEP to improve functional mobility.    Time 4   Period Weeks   Status Achieved     PT SHORT TERM GOAL #2   Title Pt will report pain of <8/10 with functional movements.    Time 4   Period Weeks   Status Achieved  5/10           PT Long Term Goals - 10/16/16 1413      PT LONG TERM GOAL #1   Title Pt will improve her FOTO from 67% limitation to 57% limitation.    Baseline 67% limitation   Time 8  Period Weeks   Status On-going  still 67% limittaion     PT LONG TERM GOAL #3   Title Pt will improve her L LE strength to >/= 3+/5 in order to improve gait and functional mobility.    Time 8   Period Weeks   Status Achieved     PT LONG TERM GOAL #4   Title Pt will be able to demonstrate safe ambulation 300 feet on sidewalk surface and navigate single curb step without LOB with no assistive device.    Baseline Currently pt with LOB on level inside surface. Pt had to stabilize herself using her UE on the trunk of a car when navigating a curb.    Time 8   Period Weeks   Status On-going               Plan - 10/18/16 1405    Clinical Impression Statement Initially soft tissue felt just as tight and dense in the right lower lumbar. As we progressed along with the soft tissue work it seemed to release more than the prior session. Pt reports she can now roll over in bed without her back catching.    Rehab Potential Good   PT Frequency 2x / week   PT Duration 8 weeks   PT Treatment/Interventions Moist Heat;Electrical Stimulation;Iontophoresis 4mg /ml Dexamethasone;Functional  mobility training;Gait training;Stair training;Ultrasound;Traction;Therapeutic activities;Therapeutic exercise;Balance training;Neuromuscular re-education;Patient/family education;Passive range of motion;Manual techniques;Dry needling;Taping;Canalith Repostioning;Vestibular   PT Next Visit Plan Soft tissue work to lower RT lumbar   Consulted and Agree with Plan of Care Patient      Patient will benefit from skilled therapeutic intervention in order to improve the following deficits and impairments:  Postural dysfunction, Decreased strength, Decreased mobility, Decreased balance, Improper body mechanics, Pain, Decreased activity tolerance, Difficulty walking, Decreased range of motion, Decreased endurance, Increased muscle spasms  Visit Diagnosis: Chronic midline low back pain without sciatica  Muscle weakness (generalized)  Difficulty in walking, not elsewhere classified     Problem List Patient Active Problem List   Diagnosis Date Noted  . Abnormal finding on imaging 04/22/2012  . Dyslipidemia 04/22/2012  . Depression 04/22/2012  . Anxiety 04/22/2012  . Tobacco abuse 04/22/2012  . COPD (chronic obstructive pulmonary disease) ([place]) 04/22/2012  . Gait abnormality 04/22/2012    Renleigh Ouellet, PTA 10/18/2016, 2:31 PM  [facility] [address], [place], [place], [address] Phone: [phone]   Fax:  [phone]  Name: LUWANA ELIOTT MRN: [id number] Date of Birth: 10/13/1932

## 2016-10-19 ENCOUNTER — Encounter: Payer: Self-pay | Admitting: Family

## 2016-10-19 ENCOUNTER — Ambulatory Visit (HOSPITAL_COMMUNITY)
Admission: RE | Admit: 2016-10-19 | Discharge: 2016-10-19 | Disposition: A | Discharge status: Home / Self Care | Payer: Medicare Other | Source: Ambulatory Visit | Attending: Vascular Surgery | Admitting: Vascular Surgery

## 2016-10-19 ENCOUNTER — Ambulatory Visit (INDEPENDENT_AMBULATORY_CARE_PROVIDER_SITE_OTHER): Payer: Medicare Other | Admitting: Family

## 2016-10-19 VITALS — BP 135/72 | HR 68 | Temp 98.2°F | Ht 64.0 in | Wt 156.1 lb

## 2016-10-19 DIAGNOSIS — Z87891 Personal history of nicotine dependence: Secondary | ICD-10-CM | POA: Diagnosis not present

## 2016-10-19 DIAGNOSIS — I779 Disorder of arteries and arterioles, unspecified: Secondary | ICD-10-CM | POA: Diagnosis not present

## 2016-10-19 DIAGNOSIS — I739 Peripheral vascular disease, unspecified: Secondary | ICD-10-CM

## 2016-10-19 NOTE — Patient Instructions (Signed)
Peripheral Vascular Disease Peripheral vascular disease (PVD) is a disease of the blood vessels that are not part of your heart and brain. A simple term for PVD is poor circulation. In most cases, PVD narrows the blood vessels that carry blood from your heart to the rest of your body. This can result in a decreased supply of blood to your arms, legs, and internal organs, like your stomach or kidneys. However, it most often affects a person's lower legs and feet. There are two types of PVD.  Organic PVD. This is the more common type. It is caused by damage to the structure of blood vessels.  Functional PVD. This is caused by conditions that make blood vessels contract and tighten (spasm). Without treatment, PVD tends to get worse over time. PVD can also lead to acute ischemic limb. This is when an arm or limb suddenly has trouble getting enough blood. This is a medical emergency. CAUSES Each type of PVD has many different causes. The most common cause of PVD is buildup of a fatty material (plaque) inside of your arteries (atherosclerosis). Small amounts of plaque can break off from the walls of the blood vessels and become lodged in a smaller artery. This blocks blood flow and can cause acute ischemic limb. Other common causes of PVD include:  Blood clots that form inside of blood vessels.  Injuries to blood vessels.  Diseases that cause inflammation of blood vessels or cause blood vessel spasms.  Health behaviors and health history that increase your risk of developing PVD. RISK FACTORS  You may have a greater risk of PVD if you:  Have a family history of PVD.  Have certain medical conditions, including:  High cholesterol.  Diabetes.  High blood pressure (hypertension).  Coronary heart disease.  Past problems with blood clots.  Past injury, such as burns or a broken bone. These may have damaged blood vessels in your limbs.  Buerger disease. This is caused by inflamed blood  vessels in your hands and feet.  Some forms of arthritis.  Rare birth defects that affect the arteries in your legs.  Use tobacco.  Do not get enough exercise.  Are obese.  Are age 80 or older. SIGNS AND SYMPTOMS  PVD may cause many different symptoms. Your symptoms depend on what part of your body is not getting enough blood. Some common signs and symptoms include:  Cramps in your lower legs. This may be a symptom of poor leg circulation (claudication).  Pain and weakness in your legs while you are physically active that goes away when you rest (intermittent claudication).  Leg pain when at rest.  Leg numbness, tingling, or weakness.  Coldness in a leg or foot, especially when compared with the other leg.  Skin or hair changes. These can include:  Hair loss.  Shiny skin.  Pale or bluish skin.  Thick toenails.  Inability to get or maintain an erection (erectile dysfunction). People with PVD are more prone to developing ulcers and sores on their toes, feet, or legs. These may take longer than normal to heal. DIAGNOSIS Your health care provider may diagnose PVD from your signs and symptoms. The health care provider will also do a physical exam. You may have tests to find out what is causing your PVD and determine its severity. Tests may include:  Blood pressure recordings from your arms and legs and measurements of the strength of your pulses (pulse volume recordings).  Imaging studies using sound waves to take pictures of   the blood flow through your blood vessels (Doppler ultrasound).  Injecting a dye into your blood vessels before having imaging studies using:  X-rays (angiogram or arteriogram).  Computer-generated X-rays (CT angiogram).  A powerful electromagnetic field and a computer (magnetic resonance angiogram or MRA). TREATMENT Treatment for PVD depends on the cause of your condition and the severity of your symptoms. It also depends on your age. Underlying  causes need to be treated and controlled. These include long-lasting (chronic) conditions, such as diabetes, high cholesterol, and high blood pressure. You may need to first try making lifestyle changes and taking medicines. Surgery may be needed if these do not work. Lifestyle changes may include:  Quitting smoking.  Exercising regularly.  Following a low-fat, low-cholesterol diet. Medicines may include:  Blood thinners to prevent blood clots.  Medicines to improve blood flow.  Medicines to improve your blood cholesterol levels. Surgical procedures may include:  A procedure that uses an inflated balloon to open a blocked artery and improve blood flow (angioplasty).  A procedure to put in a tube (stent) to keep a blocked artery open (stent implant).  Surgery to reroute blood flow around a blocked artery (peripheral bypass surgery).  Surgery to remove dead tissue from an infected wound on the affected limb.  Amputation. This is surgical removal of the affected limb. This may be necessary in cases of acute ischemic limb that are not improved through medical or surgical treatments. HOME CARE INSTRUCTIONS  Take medicines only as directed by your health care provider.  Do not use any tobacco products, including cigarettes, chewing tobacco, or electronic cigarettes. If you need help quitting, ask your health care provider.  Lose weight if you are overweight, and maintain a healthy weight as directed by your health care provider.  Eat a diet that is low in fat and cholesterol. If you need help, ask your health care provider.  Exercise regularly. Ask your health care provider to suggest some good activities for you.  Use compression stockings or other mechanical devices as directed by your health care provider.  Take good care of your feet.  Wear comfortable shoes that fit well.  Check your feet often for any cuts or sores. SEEK MEDICAL CARE IF:  You have cramps in your legs  while walking.  You have leg pain when you are at rest.  You have coldness in a leg or foot.  Your skin changes.  You have erectile dysfunction.  You have cuts or sores on your feet that are not healing. SEEK IMMEDIATE MEDICAL CARE IF:  Your arm or leg turns cold and blue.  Your arms or legs become red, warm, swollen, painful, or numb.  You have chest pain or trouble breathing.  You suddenly have weakness in your face, arm, or leg.  You become very confused or lose the ability to speak.  You suddenly have a very bad headache or lose your vision.   This information is not intended to replace advice given to you by your health care provider. Make sure you discuss any questions you have with your health care provider.   Document Released: 01/11/2005 Document Revised: 12/25/2014 Document Reviewed: 05/14/2014 Elsevier Interactive Patient Education 2016 [employer]  

## 2016-10-19 NOTE — Progress Notes (Signed)
[facility] [place]   CC: Follow up peripheral artery occlusive disease  History of Present Illness [name] is a 82 y.o. female patient that Dr. Oneida Alar has evaluated with c/o occasional pain in her left calf and foot.   She states that sometimes when she is sitting her left foot will turn purple. This resolves spontaneously. She states that walking from her house to take her trash out her left calf will have some cramping sensation on the lateral aspect but this only occurs about 1 out of every 10 times when she takes a trash out. It is not consistent in its duration its walking distance or its frequency.   She has a history of chronic back pain and has multiple cervical and lumbar spine disc disease with loss of height as well. She is being treated for this. She denies rest pain in her foot. She has no history of nonhealing wounds.   Other medical problems include mild depression and anxiety, COPD. These are stable.   She tripped and fell in February 2017, developed vertigo and right upper hip pain, after this. She feels better after 2 massages.   Pt Diabetic: No Pt smoker: former smoker, started at age 20, quit several times, quit April 2017  Pt meds include: Statin :Yes Betablocker: No ASA: No Other anticoagulants/antiplatelets: no  Past Medical History:  Diagnosis Date  . Anxiety   . Depression   . Emphysema   . Lung nodule   . Osteomalacia   . Vitamin D deficiency     Social History Social History  Substance Use Topics  . Smoking status: Current Every Day Smoker    Packs/day: 1.00    Types: Cigarettes  . Smokeless tobacco: Not on file  . Alcohol use No    Family History Family History  Problem Relation Age of Onset  . Diabetes type II    . Hypertension      Past Surgical History:  Procedure Laterality Date  . APPENDECTOMY    . CARPAL TUNNEL RELEASE    . ROTATOR CUFF REPAIR      Allergies  Allergen Reactions  . Latex      Current Outpatient Prescriptions  Medication Sig Dispense Refill  . Acetaminophen (TYLENOL 8 HOUR PO) Take by mouth.    Marland Kitchen atorvastatin (LIPITOR) 10 MG tablet Take 10 mg by mouth at bedtime.    Marland Kitchen buPROPion (WELLBUTRIN XL) 150 MG 24 hr tablet Take 150 mg by mouth daily.    Marland Kitchen CALCIUM PO Take by mouth.    . Cholecalciferol (VITAMIN D) 2000 units tablet Take 2,000 Units by mouth daily.    Marland Kitchen escitalopram (LEXAPRO) 10 MG tablet Take 10 mg by mouth daily.    Marland Kitchen gabapentin (NEURONTIN) 300 MG capsule Take 300 mg by mouth 2 (two) times daily.  11  . Meclizine HCl (BONINE PO) Take 1 tablet by mouth daily.     No current facility-administered medications for this visit.     ROS: See HPI for pertinent positives and negatives.   Physical Examination  Vitals:   10/19/16 1223  BP: 135/72  Pulse: 68  Temp: 98.2 F (36.8 C)  TempSrc: Oral  SpO2: 95%  Weight: 156 lb 1.6 oz (70.8 kg)  Height: 5\' 4"  (1.626 m)   Body mass index is 26.79 kg/m.  General: A&O x 3, WDWN, female. Gait: normal Eyes: PERRLA. Pulmonary: Respirations are non labored, CTAB, good air movement Cardiac: regular rhythm, no detected murmur.  Carotid Bruits Right Left   Negative Negative  Aorta is not palpable. Radial pulses: 2+ palpable and =                           VASCULAR EXAM: Extremities without ischemic changes, without Gangrene; without open wounds.                                                                                                          LE Pulses Right Left       FEMORAL  2+ palpable  2+ palpable        POPLITEAL  1+ palpable   not palpable       POSTERIOR TIBIAL  not palpable   not palpable        DORSALIS PEDIS      ANTERIOR TIBIAL 2+ palpable  1+ palpable    Abdomen: soft, NT, no palpable masses. Skin: no rashes, no ulcers noted. Musculoskeletal: no muscle wasting or atrophy.  Neurologic: A&O X 3; Appropriate Affect ; SENSATION: normal; MOTOR FUNCTION:  moving all  extremities equally, motor strength 4/5 throughout. Speech is fluent/normal. CN 2-12 intact.    ASSESSMENT: [name] is a 82 y.o. female who presents with severe arthritis, multilevel spine disease with associated pain, and feet becoming discolored when sitting. Fortunately she does not have DM and she quit smoking in April 2017. She takes a daily statin but is not taking an antiplatelet medication.   Consider starting daily 81 mg ASA to reduce her risk of CVD event,  if no contraindications; defer to her PCP.   At her 03/23/16 visit Dr. Oneida Alar indicates that he does not believe the discoloration in her left foot is related to arterial occlusive disease. This may be related to sympathetic discharge from her back pain. She currently has adequate perfusion to the left leg and is not at risk of limb loss. Dr. Oneida Alar dicussed with her at the April 2017 visit that her risk of limb loss lifetime is less than 5% if she is able to quit smoking. She will try to walk for 30 minutes daily and quit smoking. If her symptoms become worse over time we could consider an arteriogram however symptoms are fairly mild currently. Her symptoms are also probably multifactorial related to her back pain as well as arterial occlusive disease.  DATA ABI's: right remains stable in the normal range with bi and triphasic waveforms. Left has improved from 0.73 to 0.84 with biphasic waveforms.   PLAN:  Graduated walking program discussed and how to achieve. Based on the patient's vascular studies and examination, pt will return to clinic in 1 year with ABI's. I advised her to return sooner if needed.   I discussed in depth with the patient the nature of atherosclerosis, and emphasized the importance of maximal medical management including strict control of blood pressure, blood glucose, and lipid levels, obtaining regular exercise, and continued cessation of smoking.  The patient is aware that without maximal medical  management the underlying  atherosclerotic disease process will progress, limiting the benefit of any interventions.  The patient was given information about PAD including signs, symptoms, treatment, what symptoms should prompt the patient to seek immediate medical care, and risk reduction measures to take.  Clemon Chambers, RN, MSN, FNP-C [facility] Phone: [phone]  Clinic MD: [facility]  10/19/16 12:33 PM

## 2016-10-23 ENCOUNTER — Ambulatory Visit: Payer: Medicare Other

## 2016-10-23 DIAGNOSIS — M6281 Muscle weakness (generalized): Secondary | ICD-10-CM

## 2016-10-23 DIAGNOSIS — M545 Low back pain, unspecified: Secondary | ICD-10-CM

## 2016-10-23 DIAGNOSIS — G8929 Other chronic pain: Secondary | ICD-10-CM

## 2016-10-23 NOTE — Patient Instructions (Addendum)

## 2016-10-23 NOTE — Therapy (Signed)
[facility] [address], [place], [place], [address] Phone: [phone]   Fax:  [phone]  Physical Therapy Treatment  Patient Details  Name: [name] MRN: [id number] Date of Birth: 1932/06/15 Referring Provider: Theadore Nan, MD  Encounter Date: 10/23/2016      PT End of Session - 10/23/16 1439    Visit Number 12   Number of Visits 20   Date for PT Re-Evaluation 11/14/16   Authorization Type KX modifier at 15 visit   PT Start Time 1401   PT Stop Time 1450   PT Time Calculation (min) 49 min   Activity Tolerance Patient tolerated treatment well   Behavior During Therapy [facility] for tasks assessed/performed      Past Medical History:  Diagnosis Date  . Anxiety   . Depression   . Emphysema   . Lung nodule   . Osteomalacia   . Vitamin D deficiency     Past Surgical History:  Procedure Laterality Date  . APPENDECTOMY    . CARPAL TUNNEL RELEASE    . ROTATOR CUFF REPAIR      There were no vitals filed for this visit.      Subjective Assessment - 10/23/16 1404    Subjective Pt reports continued LBP.  "I am very seldom without pain"   Currently in Pain? Yes   Pain Score 5    Pain Location Back   Pain Orientation Lower   Pain Descriptors / Indicators Aching   Pain Type Chronic pain   Pain Onset More than a month ago   Pain Frequency Constant   Aggravating Factors  being upright   Pain Relieving Factors laying in the bed, inactivity                         [facility] Adult PT Treatment/Exercise - 10/23/16 0001      Lumbar Exercises: Stretches   Single Knee to Chest Stretch 3 reps;20 seconds   Lower Trunk Rotation 3 reps;20 seconds     Knee/Hip Exercises: Aerobic   Nustep Level 2x 8 minutes  PT present to discuss progress     Modalities   Modalities Moist Heat;Electrical Stimulation     Moist Heat Therapy   Number Minutes Moist Heat 15 Minutes   Moist Heat Location Lumbar  Spine     Electrical Stimulation   Electrical Stimulation Location Rt low back   Electrical Stimulation Action IFC   Electrical Stimulation Parameters 15 minutes   Electrical Stimulation Goals Pain     Manual Therapy   Manual Therapy Soft tissue mobilization   Soft tissue mobilization soft tissue elongation to Rt lumbar paraspinals and proximal gluteals.           Trigger Point Dry Needling - 10/23/16 1417    Consent Given? Yes   Education Handout Provided Yes   Muscles Treated Lower Body --  Rt lumbar paraspinals and multifidi              PT Education - 10/23/16 1418    Education provided Yes   Education Details DN info   Person(s) Educated Patient   Methods Explanation;Demonstration;Verbal cues   Comprehension Verbalized understanding;Returned demonstration          PT Short Term Goals - 10/16/16 1412      PT SHORT TERM GOAL #1   Title Pt will be independent with her HEP to improve functional mobility.    Time 4  Period Weeks   Status Achieved     PT SHORT TERM GOAL #2   Title Pt will report pain of <8/10 with functional movements.    Time 4   Period Weeks   Status Achieved  5/10           PT Long Term Goals - 10/23/16 1406      PT LONG TERM GOAL #1   Title Pt will improve her FOTO from 67% limitation to 57% limitation.    Time 8   Period Weeks   Status On-going     PT LONG TERM GOAL #2   Title Pt will improve her R LE strength to grossly >/= -4/5 in order to improve gait and functional mobility.    Time 8   Period Weeks   Status On-going     PT LONG TERM GOAL #3   Title Pt will improve her L LE strength to >/= 3+/5 in order to improve gait and functional mobility.    Status Achieved               Plan - 10/23/16 1407    Clinical Impression Statement Pt with continued chronic low back pain that limits function with standing tasks.  Pt with 5/10 constant LBP.  Pt with tension, trigger points with hypersensitivity and pain in  the lumbar spine and demonstrated less pain after dry needling today.  Pt will continue to benefit from skilled PT for flexibility, strength, endurance and manual for pain.     Rehab Potential Good   PT Frequency 2x / week   PT Duration 8 weeks   PT Treatment/Interventions Moist Heat;Electrical Stimulation;Iontophoresis 4mg /ml Dexamethasone;Functional mobility training;Gait training;Stair training;Ultrasound;Traction;Therapeutic activities;Therapeutic exercise;Balance training;Neuromuscular re-education;Patient/family education;Passive range of motion;Manual techniques;Dry needling;Taping;Canalith Repostioning;Vestibular   PT Next Visit Plan Assess response to dry needling today.  Flexibility, manual, gentle strength.     Consulted and Agree with Plan of Care Patient      Patient will benefit from skilled therapeutic intervention in order to improve the following deficits and impairments:  Postural dysfunction, Decreased strength, Decreased mobility, Decreased balance, Improper body mechanics, Pain, Decreased activity tolerance, Difficulty walking, Decreased range of motion, Decreased endurance, Increased muscle spasms  Visit Diagnosis: Chronic midline low back pain without sciatica  Muscle weakness (generalized)     Problem List Patient Active Problem List   Diagnosis Date Noted  . Abnormal finding on imaging 04/22/2012  . Dyslipidemia 04/22/2012  . Depression 04/22/2012  . Anxiety 04/22/2012  . Tobacco abuse 04/22/2012  . COPD (chronic obstructive pulmonary disease) ([place]) 04/22/2012  . Gait abnormality 04/22/2012    Sigurd Sos, PT 10/23/16 2:48 PM  [facility] [address], [place], [place], [address] Phone: [phone]   Fax:  [phone]  Name: [name] MRN: [id number] Date of Birth: 1932/05/20

## 2016-10-25 ENCOUNTER — Encounter: Payer: Self-pay | Admitting: Physical Therapy

## 2016-10-25 ENCOUNTER — Ambulatory Visit: Payer: Medicare Other | Admitting: Physical Therapy

## 2016-10-25 DIAGNOSIS — M545 Low back pain, unspecified: Secondary | ICD-10-CM

## 2016-10-25 DIAGNOSIS — M6281 Muscle weakness (generalized): Secondary | ICD-10-CM

## 2016-10-25 DIAGNOSIS — G8929 Other chronic pain: Secondary | ICD-10-CM

## 2016-10-25 DIAGNOSIS — R262 Difficulty in walking, not elsewhere classified: Secondary | ICD-10-CM

## 2016-10-25 NOTE — Therapy (Signed)
[facility] [address] [place], [place], [address] Phone: [phone]   Fax:  [phone]  Physical Therapy Treatment  Patient Details  Name: [name] MRN: [id number] Date of Birth: August 05, 1932 Referring Provider: Theadore Nan, MD  Encounter Date: 10/25/2016      PT End of Session - 10/25/16 1404    Visit Number 13   Number of Visits 20   PT Start Time 1401   PT Stop Time 1446   PT Time Calculation (min) 45 min   Activity Tolerance Patient tolerated treatment well   Behavior During Therapy [facility] for tasks assessed/performed      Past Medical History:  Diagnosis Date  . Anxiety   . Depression   . Emphysema   . Lung nodule   . Osteomalacia   . Vitamin D deficiency     Past Surgical History:  Procedure Laterality Date  . APPENDECTOMY    . CARPAL TUNNEL RELEASE    . ROTATOR CUFF REPAIR      There were no vitals filed for this visit.      Subjective Assessment - 10/25/16 1405    Subjective I hurt after the needling and do not think it helped. I feel better when I move around, still hardest thing is getting out of bed in the AM.    Currently in Pain? No/denies   Multiple Pain Sites No                         [facility] Adult PT Treatment/Exercise - 10/25/16 0001      Lumbar Exercises: Stretches   Single Knee to Chest Stretch 3 reps;20 seconds  concurrent with MHP to lumbar   Lower Trunk Rotation 3 reps;20 seconds     Lumbar Exercises: Aerobic   Stationary Bike L2 x 9 min     Moist Heat Therapy   Number Minutes Moist Heat 15 Minutes   Moist Heat Location Lumbar Spine  Supine     Electrical Stimulation   Electrical Stimulation Location Rt low back   Electrical Stimulation Action IFC   Electrical Stimulation Parameters 15 min in hooklying   Electrical Stimulation Goals Tone     Manual Therapy   Manual Therapy Soft tissue mobilization   Soft tissue mobilization soft  tissue elongation to Rt lumbar paraspinals and proximal gluteals.                   PT Short Term Goals - 10/16/16 1412      PT SHORT TERM GOAL #1   Title Pt will be independent with her HEP to improve functional mobility.    Time 4   Period Weeks   Status Achieved     PT SHORT TERM GOAL #2   Title Pt will report pain of <8/10 with functional movements.    Time 4   Period Weeks   Status Achieved  5/10           PT Long Term Goals - 10/23/16 1406      PT LONG TERM GOAL #1   Title Pt will improve her FOTO from 67% limitation to 57% limitation.    Time 8   Period Weeks   Status On-going     PT LONG TERM GOAL #2   Title Pt will improve her R LE strength to grossly >/= -4/5 in order to improve gait and functional mobility.    Time 8  Period Weeks   Status On-going     PT LONG TERM GOAL #3   Title Pt will improve her L LE strength to >/= 3+/5 in order to improve gait and functional mobility.    Status Achieved               Plan - 10/25/16 1404    Clinical Impression Statement Pt reported she was not sure the needling helped her back pain, but she wouldbe willing to try it again. Pt reports she can roll over in bed consistently now without significnat pain and moving around makes her back feel better. Rt lower lumbar musculature very thick and knotted today especially when compared to the left.    Rehab Potential Good   PT Frequency 2x / week   PT Duration 8 weeks   PT Treatment/Interventions Moist Heat;Electrical Stimulation;Iontophoresis 4mg /ml Dexamethasone;Functional mobility training;Gait training;Stair training;Ultrasound;Traction;Therapeutic activities;Therapeutic exercise;Balance training;Neuromuscular re-education;Patient/family education;Passive range of motion;Manual techniques;Dry needling;Taping;Canalith Repostioning;Vestibular   PT Next Visit Plan Manual to Rt lower lumbar, continue with gentle stretching and strength. Pt reports she is most  interested in walking for exercise.   Consulted and Agree with Plan of Care Patient      Patient will benefit from skilled therapeutic intervention in order to improve the following deficits and impairments:  Postural dysfunction, Decreased strength, Decreased mobility, Decreased balance, Improper body mechanics, Pain, Decreased activity tolerance, Difficulty walking, Decreased range of motion, Decreased endurance, Increased muscle spasms  Visit Diagnosis: Chronic midline low back pain without sciatica  Muscle weakness (generalized)  Difficulty in walking, not elsewhere classified     Problem List Patient Active Problem List   Diagnosis Date Noted  . Abnormal finding on imaging 04/22/2012  . Dyslipidemia 04/22/2012  . Depression 04/22/2012  . Anxiety 04/22/2012  . Tobacco abuse 04/22/2012  . COPD (chronic obstructive pulmonary disease) ([place]) 04/22/2012  . Gait abnormality 04/22/2012    Meng Winterton, PTA 10/25/2016, 2:35 PM  [facility] [address] [place], [place], [address] Phone: [phone]   Fax:  [phone]  Name: [name] MRN: [id number] Date of Birth: 1932/11/25

## 2016-10-30 ENCOUNTER — Encounter: Payer: Self-pay | Admitting: Physical Therapy

## 2016-10-30 ENCOUNTER — Ambulatory Visit: Payer: Medicare Other | Admitting: Physical Therapy

## 2016-10-30 DIAGNOSIS — R262 Difficulty in walking, not elsewhere classified: Secondary | ICD-10-CM

## 2016-10-30 DIAGNOSIS — M545 Low back pain, unspecified: Secondary | ICD-10-CM

## 2016-10-30 DIAGNOSIS — G8929 Other chronic pain: Secondary | ICD-10-CM

## 2016-10-30 DIAGNOSIS — M6281 Muscle weakness (generalized): Secondary | ICD-10-CM

## 2016-10-30 NOTE — Therapy (Signed)
[facility] [address] [place], [place], [address] Phone: [phone]   Fax:  [phone]  Physical Therapy Treatment  Patient Details  Name: [name] MRN: [id number] Date of Birth: 07/01/1932 Referring Provider: Theadore Nan, MD  Encounter Date: 10/30/2016      PT End of Session - 10/30/16 1456    Visit Number 14   Number of Visits 20   Date for PT Re-Evaluation 11/14/16   Authorization Type KX modifier at 15 visit   PT Start Time [id number]   PT Stop Time 1543   PT Time Calculation (min) 56 min   Activity Tolerance Patient tolerated treatment well   Behavior During Therapy [facility] for tasks assessed/performed      Past Medical History:  Diagnosis Date  . Anxiety   . Depression   . Emphysema   . Lung nodule   . Osteomalacia   . Vitamin D deficiency     Past Surgical History:  Procedure Laterality Date  . APPENDECTOMY    . CARPAL TUNNEL RELEASE    . ROTATOR CUFF REPAIR      There were no vitals filed for this visit.      Subjective Assessment - 10/30/16 1450    Subjective Pt had increased pain after driving for several hours. Layed in bed all Saturday with heat and ice stated back feeling like shes been kicked.    Limitations Lifting;Standing;Walking;House hold activities;Sitting   How long can you sit comfortably? 20 minutes, depending on the chair surface   How long can you stand comfortably? 5 minutes   How long can you walk comfortably? 15 minutes   Patient Stated Goals Stop hurting in my back.    Currently in Pain? Yes   Pain Score 6    Pain Location Back   Pain Orientation Lower   Pain Descriptors / Indicators Aching   Pain Type Chronic pain   Pain Onset More than a month ago   Pain Frequency Constant   Aggravating Factors  being upright   Pain Relieving Factors laying in bed, inactivity   Multiple Pain Sites No                         [facility] Adult PT  Treatment/Exercise - 10/30/16 0001      Lumbar Exercises: Stretches   Single Knee to Chest Stretch 3 reps;20 seconds  concurrent with MHP to lumbar   Lower Trunk Rotation 3 reps;20 seconds   ITB Stretch 2 reps;10 seconds   Piriformis Stretch 2 reps;10 seconds     Lumbar Exercises: Supine   Ab Set 10 reps   Bent Knee Raise 10 reps   Other Supine Lumbar Exercises Ball squeeze  10x 3 second holds     Knee/Hip Exercises: Aerobic   Nustep Level 2x 8 minutes  PT present to discuss progress     Modalities   Modalities Moist Heat;Electrical Stimulation     Moist Heat Therapy   Number Minutes Moist Heat 15 Minutes   Moist Heat Location Lumbar Spine     Electrical Stimulation   Electrical Stimulation Location Rt low back   Electrical Stimulation Action IFC   Electrical Stimulation Parameters 15 minutes in hooklying   Electrical Stimulation Goals Tone     Manual Therapy   Manual Therapy Soft tissue mobilization   Soft tissue mobilization soft tissue elongation to Rt lumbar paraspinals and proximal gluteals.  PT Short Term Goals - 10/16/16 1412      PT SHORT TERM GOAL #1   Title Pt will be independent with her HEP to improve functional mobility.    Time 4   Period Weeks   Status Achieved     PT SHORT TERM GOAL #2   Title Pt will report pain of <8/10 with functional movements.    Time 4   Period Weeks   Status Achieved  5/10           PT Long Term Goals - 10/30/16 1457      PT LONG TERM GOAL #1   Title Pt will improve her FOTO from 67% limitation to 57% limitation.    Baseline 67% limitation   Time 8   Period Weeks   Status On-going     PT LONG TERM GOAL #2   Title Pt will improve her R LE strength to grossly >/= -4/5 in order to improve gait and functional mobility.    Baseline bilateral knee flexion and extension grossly 5/5, hip flexion and  Hip abduction 4-/5.    Time 8   Period Weeks   Status On-going     PT LONG TERM GOAL  #3   Title Pt will improve her L LE strength to >/= 3+/5 in order to improve gait and functional mobility.    Baseline bilateral knee flexion and extension grossly 5/5, hip flexion and  Hip abduction 4-/5.    Time 8   Period Weeks   Status Achieved     PT LONG TERM GOAL #4   Title Pt will be able to demonstrate safe ambulation 300 feet on sidewalk surface and navigate single curb step without LOB with no assistive device.    Baseline Currently pt with LOB on level inside surface. Pt had to stabilize herself using her UE on the trunk of a car when navigating a curb.    Time 8   Period Weeks   Status On-going               Plan - 10/30/16 1541    Clinical Impression Statement Pt reports back continues to hurt but pt feels that she is benefiting from stretches and manaul therapy, feeling stronger and more agaile. Pt had some increased pain with ab sets. Able to tolerate ball squeezes well with some LE fatigue. Very tight in Bil hips and low back. with multiple tender spots that eased some with manual soft tissue mobilization.    Rehab Potential Good   PT Frequency 2x / week   PT Duration 8 weeks   PT Treatment/Interventions Moist Heat;Electrical Stimulation;Iontophoresis 4mg /ml Dexamethasone;Functional mobility training;Gait training;Stair training;Ultrasound;Traction;Therapeutic activities;Therapeutic exercise;Balance training;Neuromuscular re-education;Patient/family education;Passive range of motion;Manual techniques;Dry needling;Taping;Canalith Repostioning;Vestibular   Consulted and Agree with Plan of Care Patient      Patient will benefit from skilled therapeutic intervention in order to improve the following deficits and impairments:  Postural dysfunction, Decreased strength, Decreased mobility, Decreased balance, Improper body mechanics, Pain, Decreased activity tolerance, Difficulty walking, Decreased range of motion, Decreased endurance, Increased muscle spasms  Visit  Diagnosis: Chronic midline low back pain without sciatica  Muscle weakness (generalized)  Difficulty in walking, not elsewhere classified     Problem List Patient Active Problem List   Diagnosis Date Noted  . Abnormal finding on imaging 04/22/2012  . Dyslipidemia 04/22/2012  . Depression 04/22/2012  . Anxiety 04/22/2012  . Tobacco abuse 04/22/2012  . COPD (chronic obstructive pulmonary disease) ([place]) 04/22/2012  .  Gait abnormality 04/22/2012    Mikle Bosworth PTA 10/30/2016, 4:24 PM  [facility] [address], [place], [place], [address] Phone: [phone]   Fax:  [phone]  Name: [name] MRN: [id number] Date of Birth: [birth year]

## 2016-11-01 ENCOUNTER — Ambulatory Visit: Payer: Medicare Other

## 2016-11-01 DIAGNOSIS — M545 Low back pain, unspecified: Secondary | ICD-10-CM

## 2016-11-01 DIAGNOSIS — G8929 Other chronic pain: Secondary | ICD-10-CM

## 2016-11-01 DIAGNOSIS — M6281 Muscle weakness (generalized): Secondary | ICD-10-CM

## 2016-11-01 NOTE — Therapy (Signed)
[facility] [address] [place], [place], [address] Phone: [phone]   Fax:  [phone]  Physical Therapy Treatment  Patient Details  Name: [name] MRN: [id number] Date of Birth: Oct 27, 1932 Referring Provider: Theadore Nan, MD  Encounter Date: 11/01/2016      PT End of Session - 11/01/16 1527    Visit Number 15   Number of Visits 20   Date for PT Re-Evaluation 11/14/16   Authorization Type KX modifier at 15 visit   PT Start Time [id number]   PT Stop Time 1540   PT Time Calculation (min) 55 min   Activity Tolerance Patient tolerated treatment well   Behavior During Therapy [facility] for tasks assessed/performed      Past Medical History:  Diagnosis Date  . Anxiety   . Depression   . Emphysema   . Lung nodule   . Osteomalacia   . Vitamin D deficiency     Past Surgical History:  Procedure Laterality Date  . APPENDECTOMY    . CARPAL TUNNEL RELEASE    . ROTATOR CUFF REPAIR      There were no vitals filed for this visit.      Subjective Assessment - 11/01/16 1449    Subjective Pt reports that PT is helping.  Pt reports that she feels 30% better overall.  Not as stiff as I used to be.     Currently in Pain? Yes   Pain Score 5    Pain Location Back   Pain Orientation Lower   Pain Descriptors / Indicators Aching   Pain Type Chronic pain                         [facility] Adult PT Treatment/Exercise - 11/01/16 0001      Lumbar Exercises: Stretches   Single Knee to Chest Stretch 3 reps;20 seconds  concurrent with MHP to lumbar   Lower Trunk Rotation 3 reps;20 seconds   Piriformis Stretch 2 reps;10 seconds     Lumbar Exercises: Supine   Ab Set 10 reps   Bent Knee Raise 10 reps   Other Supine Lumbar Exercises Ball squeeze  10x 3 second holds     Knee/Hip Exercises: Aerobic   Nustep Level 2x 8 minutes  PT present to discuss progress     Modalities   Modalities Moist  Heat;Electrical Stimulation     Moist Heat Therapy   Number Minutes Moist Heat 15 Minutes   Moist Heat Location Lumbar Spine     Electrical Stimulation   Electrical Stimulation Location Rt low back   Electrical Stimulation Action IFC   Electrical Stimulation Parameters 15 minutes   Electrical Stimulation Goals Pain     Manual Therapy   Manual Therapy Soft tissue mobilization   Soft tissue mobilization soft tissue elongation to Rt lumbar paraspinals and proximal gluteals.                   PT Short Term Goals - 10/16/16 1412      PT SHORT TERM GOAL #1   Title Pt will be independent with her HEP to improve functional mobility.    Time 4   Period Weeks   Status Achieved     PT SHORT TERM GOAL #2   Title Pt will report pain of <8/10 with functional movements.    Time 4   Period Weeks   Status Achieved  5/10  PT Long Term Goals - 10/30/16 1457      PT LONG TERM GOAL #1   Title Pt will improve her FOTO from 67% limitation to 57% limitation.    Baseline 67% limitation   Time 8   Period Weeks   Status On-going     PT LONG TERM GOAL #2   Title Pt will improve her R LE strength to grossly >/= -4/5 in order to improve gait and functional mobility.    Baseline bilateral knee flexion and extension grossly 5/5, hip flexion and  Hip abduction 4-/5.    Time 8   Period Weeks   Status On-going     PT LONG TERM GOAL #3   Title Pt will improve her L LE strength to >/= 3+/5 in order to improve gait and functional mobility.    Baseline bilateral knee flexion and extension grossly 5/5, hip flexion and  Hip abduction 4-/5.    Time 8   Period Weeks   Status Achieved     PT LONG TERM GOAL #4   Title Pt will be able to demonstrate safe ambulation 300 feet on sidewalk surface and navigate single curb step without LOB with no assistive device.    Baseline Currently pt with LOB on level inside surface. Pt had to stabilize herself using her UE on the trunk of a car  when navigating a curb.    Time 8   Period Weeks   Status On-going               Plan - 11/01/16 1454    Clinical Impression Statement Pt with continued chronic LBP that limits functional mobility.  Pt reports that she feels 30% better overall and feels less stiffness due to consistent stretching.  Pt tolerates low level exercise in the clinic and responds well to manual and modalities in the clnic.     Rehab Potential Good   PT Frequency 2x / week   PT Duration 8 weeks   PT Treatment/Interventions Moist Heat;Electrical Stimulation;Iontophoresis 4mg /ml Dexamethasone;Functional mobility training;Gait training;Stair training;Ultrasound;Traction;Therapeutic activities;Therapeutic exercise;Balance training;Neuromuscular re-education;Patient/family education;Passive range of motion;Manual techniques;Dry needling;Taping;Canalith Repostioning;Vestibular   PT Next Visit Plan Manual to Rt lower lumbar, continue with gentle stretching and strength.   Consulted and Agree with Plan of Care Patient      Patient will benefit from skilled therapeutic intervention in order to improve the following deficits and impairments:  Postural dysfunction, Decreased strength, Decreased mobility, Decreased balance, Improper body mechanics, Pain, Decreased activity tolerance, Difficulty walking, Decreased range of motion, Decreased endurance, Increased muscle spasms  Visit Diagnosis: Chronic midline low back pain without sciatica  Muscle weakness (generalized)     Problem List Patient Active Problem List   Diagnosis Date Noted  . Abnormal finding on imaging 04/22/2012  . Dyslipidemia 04/22/2012  . Depression 04/22/2012  . Anxiety 04/22/2012  . Tobacco abuse 04/22/2012  . COPD (chronic obstructive pulmonary disease) ([place]) 04/22/2012  . Gait abnormality 04/22/2012     Sigurd Sos, PT 11/01/16 3:29 PM  [facility] [address], [place], [place], [address] Phone: [phone]   Fax:  [phone]  Name: [name] MRN: [id number] Date of Birth: February 10, 1932

## 2016-11-06 ENCOUNTER — Ambulatory Visit: Payer: Medicare Other

## 2016-11-06 DIAGNOSIS — M545 Low back pain, unspecified: Secondary | ICD-10-CM

## 2016-11-06 DIAGNOSIS — G8929 Other chronic pain: Secondary | ICD-10-CM

## 2016-11-06 DIAGNOSIS — M6281 Muscle weakness (generalized): Secondary | ICD-10-CM

## 2016-11-06 NOTE — Therapy (Signed)
[facility] [address], [place], [place], [address] Phone: [phone]   Fax:  [phone]  Physical Therapy Treatment  Patient Details  Name: [name] MRN: [id number] Date of Birth: 04-Oct-1932 Referring Provider: Theadore Nan, MD  Encounter Date: 11/06/2016      PT End of Session - 11/06/16 1527    Visit Number 16   Number of Visits 20   Date for PT Re-Evaluation 11/14/16   Authorization Type KX modifier needed at all visits   PT Start Time 1452  late   PT Stop Time 1540   PT Time Calculation (min) 48 min   Activity Tolerance Patient tolerated treatment well   Behavior During Therapy [facility] for tasks assessed/performed      Past Medical History:  Diagnosis Date  . Anxiety   . Depression   . Emphysema   . Lung nodule   . Osteomalacia   . Vitamin D deficiency     Past Surgical History:  Procedure Laterality Date  . APPENDECTOMY    . CARPAL TUNNEL RELEASE    . ROTATOR CUFF REPAIR      There were no vitals filed for this visit.      Subjective Assessment - 11/06/16 1459    Subjective Feeling good today.     Currently in Pain? Yes   Pain Score 2    Pain Location Back   Pain Orientation Lower;Right   Pain Descriptors / Indicators Aching   Pain Type Chronic pain   Pain Onset More than a month ago   Pain Frequency Constant   Aggravating Factors  being upright   Pain Relieving Factors laying in bed, inactivity                         [facility] Adult PT Treatment/Exercise - 11/06/16 0001      Lumbar Exercises: Stretches   Single Knee to Chest Stretch 3 reps;20 seconds  concurrent with MHP to lumbar   Lower Trunk Rotation 3 reps;20 seconds   Piriformis Stretch --     Lumbar Exercises: Supine   Other Supine Lumbar Exercises --     Knee/Hip Exercises: Aerobic   Nustep Level 2x 8 minutes  PT present to discuss progress     Modalities   Modalities Electrical  Stimulation;Moist Heat     Moist Heat Therapy   Number Minutes Moist Heat 15 Minutes   Moist Heat Location Lumbar Spine     Electrical Stimulation   Electrical Stimulation Location Rt low back   Electrical Stimulation Action IFC   Electrical Stimulation Parameters 15 minutes   Electrical Stimulation Goals Pain     Manual Therapy   Manual Therapy Soft tissue mobilization   Soft tissue mobilization soft tissue elongation to Rt lumbar paraspinals and proximal gluteals.                   PT Short Term Goals - 10/16/16 1412      PT SHORT TERM GOAL #1   Title Pt will be independent with her HEP to improve functional mobility.    Time 4   Period Weeks   Status Achieved     PT SHORT TERM GOAL #2   Title Pt will report pain of <8/10 with functional movements.    Time 4   Period Weeks   Status Achieved  5/10           PT Long Term Goals -  11/06/16 1501      PT LONG TERM GOAL #1   Title Pt will improve her FOTO from 67% limitation to 57% limitation.    Time 8   Period Weeks   Status On-going     PT LONG TERM GOAL #2   Title Pt will improve her R LE strength to grossly >/= -4/5 in order to improve gait and functional mobility.    Time 8   Period Weeks   Status On-going     PT LONG TERM GOAL #3   Title Pt will improve her L LE strength to >/= 3+/5 in order to improve gait and functional mobility.    Time 8   Period Weeks   Status On-going     PT LONG TERM GOAL #4   Title Pt will be able to demonstrate safe ambulation 300 feet on sidewalk surface and navigate single curb step without LOB with no assistive device.    Status Achieved               Plan - 11/06/16 1501    Clinical Impression Statement Pt with continued LBP that limits functional mobility.  Pt reports 30% overall improvement since the start of care.  Pt is stretching at home and performing core exercises.  Pain is reduced today.  Pt will benefit from 2 more sessions to finalize HEP and  modalities for pain management.     Rehab Potential Good   PT Frequency 2x / week   PT Duration 8 weeks   PT Treatment/Interventions Moist Heat;Electrical Stimulation;Iontophoresis 4mg /ml Dexamethasone;Functional mobility training;Gait training;Stair training;Ultrasound;Traction;Therapeutic activities;Therapeutic exercise;Balance training;Neuromuscular re-education;Patient/family education;Passive range of motion;Manual techniques;Dry needling;Taping;Canalith Repostioning;Vestibular   PT Next Visit Plan Manual to Rt lower lumbar, continue with gentle stretching and strength. 2 more sessions probable.    Consulted and Agree with Plan of Care Patient      Patient will benefit from skilled therapeutic intervention in order to improve the following deficits and impairments:  Postural dysfunction, Decreased strength, Decreased mobility, Decreased balance, Improper body mechanics, Pain, Decreased activity tolerance, Difficulty walking, Decreased range of motion, Decreased endurance, Increased muscle spasms  Visit Diagnosis: Chronic midline low back pain without sciatica  Muscle weakness (generalized)     Problem List Patient Active Problem List   Diagnosis Date Noted  . Abnormal finding on imaging 04/22/2012  . Dyslipidemia 04/22/2012  . Depression 04/22/2012  . Anxiety 04/22/2012  . Tobacco abuse 04/22/2012  . COPD (chronic obstructive pulmonary disease) ([place]) 04/22/2012  . Gait abnormality 04/22/2012     Sigurd Sos, PT 11/06/16 3:29 PM  [facility] [address] [place], [place], [address] Phone: [phone]   Fax:  [phone]  Name: [name] MRN: [id number] Date of Birth: [birth year]

## 2016-11-08 ENCOUNTER — Encounter: Payer: Self-pay | Admitting: Physical Therapy

## 2016-11-08 ENCOUNTER — Ambulatory Visit: Payer: Medicare Other | Admitting: Physical Therapy

## 2016-11-08 DIAGNOSIS — M545 Low back pain, unspecified: Secondary | ICD-10-CM

## 2016-11-08 DIAGNOSIS — G8929 Other chronic pain: Secondary | ICD-10-CM

## 2016-11-08 DIAGNOSIS — M6281 Muscle weakness (generalized): Secondary | ICD-10-CM

## 2016-11-08 NOTE — Addendum Note (Signed)
Addended by: [name] Cure A on: 11/08/2016 08:36 AM   Modules accepted: Orders

## 2016-11-08 NOTE — Therapy (Signed)
[facility] [address], [place], [place], [address] Phone: [phone]   Fax:  [phone]  Physical Therapy Treatment  Patient Details  Name: [name] MRN: [id number] Date of Birth: January 06, 1932 Referring Provider: Theadore Nan, MD  Encounter Date: 11/08/2016      PT End of Session - 11/08/16 1058    Visit Number 17   Number of Visits 20   Date for PT Re-Evaluation 11/14/16   Authorization Type KX modifier needed at all visits   PT Start Time 1056   PT Stop Time 1150   PT Time Calculation (min) 54 min   Activity Tolerance Patient tolerated treatment well   Behavior During Therapy [facility] for tasks assessed/performed      Past Medical History:  Diagnosis Date  . Anxiety   . Depression   . Emphysema   . Lung nodule   . Osteomalacia   . Vitamin D deficiency     Past Surgical History:  Procedure Laterality Date  . APPENDECTOMY    . CARPAL TUNNEL RELEASE    . ROTATOR CUFF REPAIR      There were no vitals filed for this visit.      Subjective Assessment - 11/08/16 1056    Subjective Had a lot of pain this morning, a little better now that has been walking around.   Limitations Lifting;Standing;Walking;House hold activities;Sitting   How long can you sit comfortably? 20 minutes, depending on the chair surface   How long can you stand comfortably? 5 minutes   How long can you walk comfortably? 15 minutes   Patient Stated Goals Stop hurting in my back.    Currently in Pain? Yes   Pain Score 4    Pain Location Back   Pain Orientation Right;Lower   Pain Descriptors / Indicators Aching   Pain Type Chronic pain   Pain Onset More than a month ago   Pain Frequency Constant   Aggravating Factors  being upright   Pain Relieving Factors laying in bed, inactivity   Effect of Pain on Daily Activities difficulty with household items   Multiple Pain Sites No                          [facility] Adult PT Treatment/Exercise - 11/08/16 0001      Lumbar Exercises: Stretches   Single Knee to Chest Stretch 3 reps;20 seconds  concurrent with MHP to lumbar   Lower Trunk Rotation --   Piriformis Stretch 2 reps;10 seconds     Lumbar Exercises: Supine   Ab Set 10 reps  with red ball   Bent Knee Raise 20 reps   Other Supine Lumbar Exercises Ball squeeze  10x 3 second holds; increased pain     Knee/Hip Exercises: Aerobic   Nustep Level 2x 8 minutes  PT present to discuss progress     Modalities   Modalities Electrical Stimulation;Moist Heat     Moist Heat Therapy   Number Minutes Moist Heat 15 Minutes   Moist Heat Location Lumbar Spine     Electrical Stimulation   Electrical Stimulation Location Rt low back   Electrical Stimulation Action IFC   Electrical Stimulation Parameters 15 minutes   Electrical Stimulation Goals Pain     Manual Therapy   Manual Therapy Soft tissue mobilization   Soft tissue mobilization soft tissue elongation to Rt lumbar paraspinals and proximal gluteals.  PT Short Term Goals - 10/16/16 1412      PT SHORT TERM GOAL #1   Title Pt will be independent with her HEP to improve functional mobility.    Time 4   Period Weeks   Status Achieved     PT SHORT TERM GOAL #2   Title Pt will report pain of <8/10 with functional movements.    Time 4   Period Weeks   Status Achieved  5/10           PT Long Term Goals - 11/06/16 1501      PT LONG TERM GOAL #1   Title Pt will improve her FOTO from 67% limitation to 57% limitation.    Time 8   Period Weeks   Status On-going     PT LONG TERM GOAL #2   Title Pt will improve her R LE strength to grossly >/= -4/5 in order to improve gait and functional mobility.    Time 8   Period Weeks   Status On-going     PT LONG TERM GOAL #3   Title Pt will improve her L LE strength to >/= 3+/5 in order to improve gait and functional mobility.    Time 8   Period Weeks    Status On-going     PT LONG TERM GOAL #4   Title Pt will be able to demonstrate safe ambulation 300 feet on sidewalk surface and navigate single curb step without LOB with no assistive device.    Status Achieved               Plan - 11/08/16 1111    Clinical Impression Statement Pt reports continued pain in low back that is severe mostly in the mornings. Has increased pain with ball squeeze exercise but able to tolerate all other strengthening exercises. Pt reports getting more relief from heat and manual massage.    Rehab Potential Good   PT Frequency 2x / week   PT Duration 8 weeks   PT Treatment/Interventions Moist Heat;Electrical Stimulation;Iontophoresis 4mg /ml Dexamethasone;Functional mobility training;Gait training;Taylor training;Ultrasound;Traction;Therapeutic activities;Therapeutic exercise;Balance training;Neuromuscular re-education;Patient/family education;Passive range of motion;Manual techniques;Dry needling;Taping;Canalith Repostioning;Vestibular   PT Next Visit Plan Manual to Rt lower lumbar, continue with gentle stretching and strength   Consulted and Agree with Plan of Care Patient      Patient will benefit from skilled therapeutic intervention in order to improve the following deficits and impairments:  Postural dysfunction, Decreased strength, Decreased mobility, Decreased balance, Improper body mechanics, Pain, Decreased activity tolerance, Difficulty walking, Decreased range of motion, Decreased endurance, Increased muscle spasms  Visit Diagnosis: Chronic midline low back pain without sciatica  Muscle weakness (generalized)     Problem List Patient Active Problem List   Diagnosis Date Noted  . Abnormal finding on imaging 04/22/2012  . Dyslipidemia 04/22/2012  . Depression 04/22/2012  . Anxiety 04/22/2012  . Tobacco abuse 04/22/2012  . COPD (chronic obstructive pulmonary disease) ([place]) 04/22/2012  . Gait abnormality 04/22/2012    [name]  PTA 11/08/2016, 11:45 AM  [facility] [address], [place], [place], [address] Phone: [phone]   Fax:  [phone]  Name: [name] MRN: [id number] Date of Birth: 01-07-1932

## 2016-11-13 ENCOUNTER — Ambulatory Visit: Payer: Medicare Other

## 2016-11-13 DIAGNOSIS — G8929 Other chronic pain: Secondary | ICD-10-CM

## 2016-11-13 DIAGNOSIS — M6281 Muscle weakness (generalized): Secondary | ICD-10-CM

## 2016-11-13 DIAGNOSIS — M545 Low back pain, unspecified: Secondary | ICD-10-CM

## 2016-11-13 DIAGNOSIS — R262 Difficulty in walking, not elsewhere classified: Secondary | ICD-10-CM

## 2016-11-13 NOTE — Therapy (Signed)
[facility] [address], [place], [place], [address] Phone: [phone]   Fax:  [phone]  Physical Therapy Treatment  Patient Details  Name: [name] MRN: [id number] Date of Birth: 09-Oct-1932 Referring Provider: Theadore Nan, MD  Encounter Date: 11/13/2016      PT End of Session - 11/13/16 1525    Visit Number 18   PT Start Time 1856   PT Stop Time 1540   PT Time Calculation (min) 42 min   Activity Tolerance Patient tolerated treatment well   Behavior During Therapy [facility] for tasks assessed/performed      Past Medical History:  Diagnosis Date  . Anxiety   . Depression   . Emphysema   . Lung nodule   . Osteomalacia   . Vitamin D deficiency     Past Surgical History:  Procedure Laterality Date  . APPENDECTOMY    . CARPAL TUNNEL RELEASE    . ROTATOR CUFF REPAIR      There were no vitals filed for this visit.      Subjective Assessment - 11/13/16 1501    Subjective My Rt hip is killing me.  I have spent a lot of time in bed due to pain.     Currently in Pain? Yes   Pain Score 6    Pain Location Back   Pain Orientation Right;Lower   Pain Descriptors / Indicators Aching   Pain Type Chronic pain   Pain Radiating Towards Rt leg   Pain Onset More than a month ago   Pain Frequency Constant   Aggravating Factors  a lot of activity   Pain Relieving Factors laying in bed, inactivity            [facility] PT Assessment - 11/13/16 0001      Assessment   Medical Diagnosis low back pain     Observation/Other Assessments   Focus on Therapeutic Outcomes (FOTO)  62% limitation     Strength   Strength Assessment Site Hip;Knee   Right Hip Flexion 3+/5   Right Hip ABduction 3/5   Left Hip Flexion 3/5   Right Knee Extension 4-/5   Left Knee Extension 4-/5                     [facility] Adult PT Treatment/Exercise - 11/13/16 0001      Knee/Hip Exercises: Aerobic   Nustep Level 2x 8  minutes  PT present to discuss progress     Modalities   Modalities Electrical Stimulation     Moist Heat Therapy   Number Minutes Moist Heat 15 Minutes   Moist Heat Location Lumbar Spine     Electrical Stimulation   Electrical Stimulation Location Rt low back   Electrical Stimulation Action IFC   Electrical Stimulation Parameters 15 minutes   Electrical Stimulation Goals Pain     Manual Therapy   Manual Therapy Soft tissue mobilization   Soft tissue mobilization soft tissue elongation to Rt lumbar paraspinals and proximal gluteals.                   PT Short Term Goals - 10/16/16 1412      PT SHORT TERM GOAL #1   Title Pt will be independent with her HEP to improve functional mobility.    Time 4   Period Weeks   Status Achieved     PT SHORT TERM GOAL #2   Title Pt will report pain of <8/10  with functional movements.    Time 4   Period Weeks   Status Achieved  5/10           PT Long Term Goals - 11/19/16 1503      PT LONG TERM GOAL #1   Title Pt will improve her FOTO from 67% limitation to 57% limitation.    Status Partially Met     PT LONG TERM GOAL #2   Title Pt will improve her R LE strength to grossly >/= -4/5 in order to improve gait and functional mobility.    Status Partially Met     PT LONG TERM GOAL #3   Title Pt will improve her L LE strength to >/= 3+/5 in order to improve gait and functional mobility.    Status Partially Met     PT LONG TERM GOAL #4   Title Pt will be able to demonstrate safe ambulation 300 feet on sidewalk surface and navigate single curb step without LOB with no assistive device.    Status Achieved               Plan - 19-Nov-2016 1503    Clinical Impression Statement Pt with continued chronic LBP and Rt LE radiculopathy.  Pt has had intermittent relief of symptoms and this has been inconsistent.  Pt has HEP in place for flexiblity and strength.  Pt will discharge to HEP and will follow-up with MD to discuss  continue pain.     Rehab Potential Good   PT Frequency 2x / week   PT Duration 8 weeks   PT Treatment/Interventions Moist Heat;Electrical Stimulation;Iontophoresis '4mg'$ /ml Dexamethasone;Functional mobility training;Gait training;Stair training;Ultrasound;Traction;Therapeutic activities;Therapeutic exercise;Balance training;Neuromuscular re-education;Patient/family education;Passive range of motion;Manual techniques;Dry needling;Taping;Canalith Repostioning;Vestibular   PT Next Visit Plan D/C PT to HEP today   Consulted and Agree with Plan of Care Patient      Patient will benefit from skilled therapeutic intervention in order to improve the following deficits and impairments:  Postural dysfunction, Decreased strength, Decreased mobility, Decreased balance, Improper body mechanics, Pain, Decreased activity tolerance, Difficulty walking, Decreased range of motion, Decreased endurance, Increased muscle spasms  Visit Diagnosis: Chronic midline low back pain without sciatica  Muscle weakness (generalized)  Difficulty in walking, not elsewhere classified       G-Codes - 11/19/2016 1500    Functional Assessment Tool Used FOTO: 62% limitation   Functional Limitation Mobility: Walking and moving around   Mobility: Walking and Moving Around Goal Status [phone]) At least 40 percent but less than 60 percent impaired, limited or restricted   Mobility: Walking and Moving Around Discharge Status [phone]) At least 60 percent but less than 80 percent impaired, limited or restricted      Problem List Patient Active Problem List   Diagnosis Date Noted  . Abnormal finding on imaging 04/22/2012  . Dyslipidemia 04/22/2012  . Depression 04/22/2012  . Anxiety 04/22/2012  . Tobacco abuse 04/22/2012  . COPD (chronic obstructive pulmonary disease) ([place]) 04/22/2012  . Gait abnormality 04/22/2012   PHYSICAL THERAPY DISCHARGE SUMMARY  Visits from Start of Care: 18  Current functional level related to goals  / functional outcomes: See above for current status.      Remaining deficits: Chronic LBP, Rt LE pain and LE weakness.  Pt has HEP In place and will continue with HEP and will follow-up with MD as needed.     Education / Equipment: HEP Plan: Patient agrees to discharge.  Patient goals were partially met. Patient is being discharged due  to being pleased with the current functional level.  ?????         Sigurd Sos, PT 11/13/16 3:27 PM  [facility] [address] [place], [place], [address] Phone: [phone]   Fax:  [phone]  Name: [name] MRN: [id number] Date of Birth: January 27, 1932

## 2017-01-16 ENCOUNTER — Other Ambulatory Visit: Payer: Self-pay | Admitting: Neurological Surgery

## 2017-01-16 DIAGNOSIS — M5414 Radiculopathy, thoracic region: Secondary | ICD-10-CM

## 2017-01-18 ENCOUNTER — Ambulatory Visit
Admission: RE | Admit: 2017-01-18 | Discharge: 2017-01-18 | Disposition: A | Discharge status: Home / Self Care | Payer: Medicare Other | Source: Ambulatory Visit | Attending: Neurological Surgery | Admitting: Neurological Surgery

## 2017-01-18 DIAGNOSIS — M5414 Radiculopathy, thoracic region: Secondary | ICD-10-CM

## 2017-01-18 MED ORDER — TRIAMCINOLONE ACETONIDE 40 MG/ML IJ SUSP (RADIOLOGY)
60.0000 mg | Freq: Once | INTRAMUSCULAR | Status: AC
  Administered 2017-01-18: 60 mg via EPIDURAL

## 2017-01-18 MED ORDER — IOPAMIDOL (ISOVUE-M 300) INJECTION 61%
1.0000 mL | Freq: Once | INTRAMUSCULAR | Status: AC | PRN
  Administered 2017-01-18: 1 mL via EPIDURAL

## 2017-01-18 NOTE — Discharge Instructions (Signed)

## 2017-04-25 ENCOUNTER — Other Ambulatory Visit: Payer: Self-pay | Admitting: Family Medicine

## 2017-04-25 DIAGNOSIS — Z1231 Encounter for screening mammogram for malignant neoplasm of breast: Secondary | ICD-10-CM

## 2017-05-10 ENCOUNTER — Ambulatory Visit: Payer: Medicare Other

## 2017-05-11 ENCOUNTER — Ambulatory Visit: Payer: Medicare Other

## 2017-05-22 ENCOUNTER — Ambulatory Visit
Admission: RE | Admit: 2017-05-22 | Discharge: 2017-05-22 | Disposition: A | Discharge status: Home / Self Care | Payer: Medicare Other | Source: Ambulatory Visit | Attending: Family Medicine | Admitting: Family Medicine

## 2017-05-22 DIAGNOSIS — Z1231 Encounter for screening mammogram for malignant neoplasm of breast: Secondary | ICD-10-CM

## 2017-06-19 ENCOUNTER — Emergency Department (HOSPITAL_COMMUNITY)
Admission: EM | Admit: 2017-06-19 | Discharge: 2017-06-19 | Disposition: A | Discharge status: Home / Self Care | Payer: Medicare Other | Attending: Emergency Medicine | Admitting: Emergency Medicine

## 2017-06-19 ENCOUNTER — Encounter (HOSPITAL_COMMUNITY): Payer: Self-pay | Admitting: Emergency Medicine

## 2017-06-19 ENCOUNTER — Emergency Department (HOSPITAL_COMMUNITY): Payer: Medicare Other

## 2017-06-19 DIAGNOSIS — Y92009 Unspecified place in unspecified non-institutional (private) residence as the place of occurrence of the external cause: Secondary | ICD-10-CM | POA: Insufficient documentation

## 2017-06-19 DIAGNOSIS — F17210 Nicotine dependence, cigarettes, uncomplicated: Secondary | ICD-10-CM | POA: Diagnosis not present

## 2017-06-19 DIAGNOSIS — Z91040 Latex allergy status: Secondary | ICD-10-CM | POA: Diagnosis not present

## 2017-06-19 DIAGNOSIS — J449 Chronic obstructive pulmonary disease, unspecified: Secondary | ICD-10-CM | POA: Diagnosis not present

## 2017-06-19 DIAGNOSIS — Y999 Unspecified external cause status: Secondary | ICD-10-CM | POA: Diagnosis not present

## 2017-06-19 DIAGNOSIS — Y9301 Activity, walking, marching and hiking: Secondary | ICD-10-CM | POA: Insufficient documentation

## 2017-06-19 DIAGNOSIS — S0101XA Laceration without foreign body of scalp, initial encounter: Secondary | ICD-10-CM | POA: Insufficient documentation

## 2017-06-19 DIAGNOSIS — Z79899 Other long term (current) drug therapy: Secondary | ICD-10-CM | POA: Insufficient documentation

## 2017-06-19 DIAGNOSIS — W0110XA Fall on same level from slipping, tripping and stumbling with subsequent striking against unspecified object, initial encounter: Secondary | ICD-10-CM | POA: Diagnosis not present

## 2017-06-19 DIAGNOSIS — S0990XA Unspecified injury of head, initial encounter: Secondary | ICD-10-CM

## 2017-06-19 DIAGNOSIS — Z7982 Long term (current) use of aspirin: Secondary | ICD-10-CM | POA: Insufficient documentation

## 2017-06-19 DIAGNOSIS — R42 Dizziness and giddiness: Secondary | ICD-10-CM

## 2017-06-19 HISTORY — DX: Dizziness and giddiness: R42

## 2017-06-19 LAB — BASIC METABOLIC PANEL
Anion gap: 8 (ref 5–15)
BUN: 15 mg/dL (ref 6–20)
CO2: 28 mmol/L (ref 22–32)
Calcium: 9 mg/dL (ref 8.9–10.3)
Chloride: 103 mmol/L (ref 101–111)
Creatinine, Ser: 0.67 mg/dL (ref 0.44–1.00)
GFR calc Af Amer: >60 mL/min (ref >60)
GFR calc non Af Amer: >60 mL/min (ref >60)
Glucose, Bld: 90 mg/dL (ref 65–99)
Potassium: 4 mmol/L (ref 3.5–5.1)
Sodium: 139 mmol/L (ref 135–145)

## 2017-06-19 LAB — URINALYSIS, ROUTINE W REFLEX MICROSCOPIC
Bilirubin Urine: NEGATIVE
Glucose, UA: NEGATIVE mg/dL
Ketones, ur: NEGATIVE mg/dL
Nitrite: NEGATIVE
Protein, ur: NEGATIVE mg/dL
Specific Gravity, Urine: 1.005 (ref 1.005–1.030)
pH: 7 (ref 5.0–8.0)

## 2017-06-19 LAB — CBC WITH DIFFERENTIAL/PLATELET
Basophils Absolute: 0 10*3/uL (ref 0.0–0.1)
Basophils Relative: 1 %
Eosinophils Absolute: 0.2 10*3/uL (ref 0.0–0.7)
Eosinophils Relative: 2 %
HCT: 40.8 % (ref 36.0–46.0)
Hemoglobin: 14 g/dL (ref 12.0–15.0)
Lymphocytes Relative: 23 %
Lymphs Abs: 1.5 10*3/uL (ref 0.7–4.0)
MCH: 30.6 pg (ref 26.0–34.0)
MCHC: 34.3 g/dL (ref 30.0–36.0)
MCV: 89.1 fL (ref 78.0–100.0)
Monocytes Absolute: 0.6 10*3/uL (ref 0.1–1.0)
Monocytes Relative: 9 %
Neutro Abs: 4.3 10*3/uL (ref 1.7–7.7)
Neutrophils Relative %: 65 %
Platelets: 147 10*3/uL — ABNORMAL LOW (ref 150–400)
RBC: 4.58 MIL/uL (ref 3.87–5.11)
RDW: 12.8 % (ref 11.5–15.5)
WBC: 6.6 10*3/uL (ref 4.0–10.5)

## 2017-06-19 LAB — CBG MONITORING, ED: Glucose-Capillary: 94 mg/dL (ref 65–99)

## 2017-06-19 MED ORDER — MECLIZINE HCL 25 MG PO TABS
25.0000 mg | ORAL_TABLET | Freq: Once | ORAL | Status: AC
  Administered 2017-06-19: 25 mg via ORAL
  Filled 2017-06-19: qty 1

## 2017-06-19 MED ORDER — FENTANYL CITRATE (PF) 100 MCG/2ML IJ SOLN
50.0000 ug | Freq: Once | INTRAMUSCULAR | Status: AC
  Administered 2017-06-19: 50 ug via INTRAVENOUS
  Filled 2017-06-19: qty 2

## 2017-06-19 MED ORDER — SODIUM CHLORIDE 0.9 % IV BOLUS (SEPSIS)
500.0000 mL | Freq: Once | INTRAVENOUS | Status: AC
Start: 2017-06-19 — End: 2017-06-19
  Administered 2017-06-19: 500 mL via INTRAVENOUS

## 2017-06-19 MED ORDER — LIDOCAINE-EPINEPHRINE (PF) 2 %-1:200000 IJ SOLN
10.0000 mL | Freq: Once | INTRAMUSCULAR | Status: AC
  Administered 2017-06-19: 10 mL
  Filled 2017-06-19: qty 20

## 2017-06-19 NOTE — ED Notes (Addendum)
Patient transported to CT 

## 2017-06-19 NOTE — ED Provider Notes (Signed)
[facility] DEPT Provider Note   CSN: [id number] Arrival date & time: 06/19/17  1620     History   Chief Complaint Chief Complaint  Patient presents with  . Fall  . Head Injury    HPI Kirsten RABALAIS is a 83 y.o. female.  HPI   Patient with hx vertigo, currently undergoing therapy for same, presents with fall after becoming dizzy while getting up to go to answer the door.  States she thought she was doing better with the therapy and therefore did not take her home meclizine.  She was in bed due to vertigo when she thought she heard the doorbell.  When she made the turn towards the door she lost her balance and fell backwards hitting her head.  She is unsure of LOC.  She was able to crawl across the floor to her phone.   Has pain in the back of her head.  Denies any other pain including neck or back pain.  States she ate today but generally does not eat and drink well.  Denies any recent illness.  Reports tetanus is up to date.   Past Medical History:  Diagnosis Date  . Anxiety   . Depression   . Emphysema   . Lung nodule   . Osteomalacia   . Vertigo   . Vitamin D deficiency     Patient Active Problem List   Diagnosis Date Noted  . Abnormal finding on imaging 04/22/2012  . Dyslipidemia 04/22/2012  . Depression 04/22/2012  . Anxiety 04/22/2012  . Tobacco abuse 04/22/2012  . COPD (chronic obstructive pulmonary disease) [place]) 04/22/2012  . Gait abnormality 04/22/2012    Past Surgical History:  Procedure Laterality Date  . APPENDECTOMY    . BREAST EXCISIONAL BIOPSY Left 1978  . CARPAL TUNNEL RELEASE    . ROTATOR CUFF REPAIR      OB History    No data available       Home Medications    Prior to Admission medications   Medication Sig Start Date End Date Taking? Authorizing Provider  acetaminophen (TYLENOL 8 HOUR) 650 MG CR tablet Take 650 mg by mouth every 8 (eight) hours as needed (pain).    Yes [provider]  aspirin EC 81 MG tablet Take 81 mg  by mouth daily.   Yes [provider]  atorvastatin (LIPITOR) 10 MG tablet Take 10 mg by mouth at bedtime.   Yes [provider]  buPROPion (WELLBUTRIN XL) 150 MG 24 hr tablet Take 150 mg by mouth daily.   Yes [provider]  Cholecalciferol (VITAMIN D) 2000 units tablet Take 2,000 Units by mouth daily.   Yes [provider]  escitalopram (LEXAPRO) 10 MG tablet Take 40 mg by mouth daily.    Yes [provider]  Multiple Vitamin (MULTIVITAMIN WITH MINERALS) TABS tablet Take 1 tablet by mouth daily.   Yes [provider]  tiotropium (SPIRIVA) 18 MCG inhalation capsule Place 18 mcg into inhaler and inhale daily.   Yes [provider]    Family History Family History  Problem Relation Age of Onset  . Diabetes type II Unknown   . Hypertension Unknown   . Breast cancer Maternal Aunt     Social History Social History  Substance Use Topics  . Smoking status: Current Every Day Smoker    Packs/day: 1.00    Types: Cigarettes  . Smokeless tobacco: Never Used  . Alcohol use No     Allergies  Adhesive [tape] and Latex   Review of Systems Review of Systems  All other systems reviewed and are negative.    Physical Exam Updated Vital Signs BP 132/65   Pulse 62   Temp 98.3 F (36.8 C) (Oral)   Resp 17   Ht 5\' 1"  (1.549 m)   Wt 72.6 kg (160 lb)   SpO2 94%   BMI 30.23 kg/m   Physical Exam  Constitutional: She appears well-developed and well-nourished. No distress.  HENT:  Head: Normocephalic.  Scalp hematoma with overlying laceration   Neck: Neck supple.  Cardiovascular: Normal rate and regular rhythm.   Pulmonary/Chest: Effort normal and breath sounds normal. No respiratory distress. She has no wheezes. She has no rales.  Abdominal: Soft. She exhibits no distension. There is no tenderness. There is no rebound and no guarding.  Neurological: She is alert.  CN II-XII intact, EOMs intact, no pronator drift, grip  strengths equal bilaterally; strength 5/5 in all extremities, sensation intact in all extremities; finger to nose is normal.     Skin: She is not diaphoretic.  Nursing note and vitals reviewed.    ED Treatments / Results  Labs (all labs ordered are listed, but only abnormal results are displayed) Labs Reviewed  CBC WITH DIFFERENTIAL/PLATELET - Abnormal; Notable for the following:       Result Value   Platelets 147 (*)    All other components within normal limits  URINALYSIS, ROUTINE W REFLEX MICROSCOPIC - Abnormal; Notable for the following:    Color, Urine STRAW (*)    Hgb urine dipstick SMALL (*)    Leukocytes, UA SMALL (*)    Bacteria, UA RARE (*)    Squamous Epithelial / LPF 0-5 (*)    All other components within normal limits  BASIC METABOLIC PANEL  CBG MONITORING, ED    EKG  EKG Interpretation  Date/Time:  Tuesday June 19 2017 16:40:30 EDT Ventricular Rate:  62 PR Interval:    QRS Duration: 89 QT Interval:  440 QTC Calculation: 447 R Axis:   -5 Text Interpretation:  Sinus rhythm Nonspecific T abnrm, anterolateral leads similar to previous EKG 10/2011  Confirmed by Brantley Stage [phone]) on 06/19/2017 4:53:28 PM       Radiology Ct Head Wo Contrast  Result Date: 06/19/2017 CLINICAL DATA:  Dizziness and fall today with a blow to the back of the head. EXAM: CT HEAD WITHOUT CONTRAST CT CERVICAL SPINE WITHOUT CONTRAST TECHNIQUE: Multidetector CT imaging of the head and cervical spine was performed following the standard protocol without intravenous contrast. Multiplanar CT image reconstructions of the cervical spine were also generated. COMPARISON:  None. FINDINGS: CT HEAD FINDINGS Brain: There is some cortical atrophy and chronic microvascular ischemic change. No evidence of acute intracranial abnormality including hemorrhage, infarct, mass lesion, mass effect, midline shift or abnormal extra-axial fluid collection. No hydrocephalus or pneumocephalus. Vascular: Negative. Skull:  Intact. Sinuses/Orbits: Negative. Other: Large scalp hematoma posteriorly on the right is noted. CT CERVICAL SPINE FINDINGS Alignment: Maintained with straightening of lordosis noted. Skull base and vertebrae: No acute fracture. No primary bone lesion or focal pathologic process. Soft tissues and spinal canal: No prevertebral fluid or swelling. No visible canal hematoma. Disc levels: Multilevel loss of disc space height appears worst at C3-4 and C5-6. Upper chest: Lung apices are clear. Other: None. IMPRESSION: Large scalp hematoma posteriorly on the right. No other acute abnormality head or cervical spine. Mild atrophy and chronic microvascular ischemic change. Cervical spondylosis. Electronically Signed   By:  Inge Rise M.D.   On: 06/19/2017 17:20   Ct Cervical Spine Wo Contrast  Result Date: 06/19/2017 CLINICAL DATA:  Dizziness and fall today with a blow to the back of the head. EXAM: CT HEAD WITHOUT CONTRAST CT CERVICAL SPINE WITHOUT CONTRAST TECHNIQUE: Multidetector CT imaging of the head and cervical spine was performed following the standard protocol without intravenous contrast. Multiplanar CT image reconstructions of the cervical spine were also generated. COMPARISON:  None. FINDINGS: CT HEAD FINDINGS Brain: There is some cortical atrophy and chronic microvascular ischemic change. No evidence of acute intracranial abnormality including hemorrhage, infarct, mass lesion, mass effect, midline shift or abnormal extra-axial fluid collection. No hydrocephalus or pneumocephalus. Vascular: Negative. Skull: Intact. Sinuses/Orbits: Negative. Other: Large scalp hematoma posteriorly on the right is noted. CT CERVICAL SPINE FINDINGS Alignment: Maintained with straightening of lordosis noted. Skull base and vertebrae: No acute fracture. No primary bone lesion or focal pathologic process. Soft tissues and spinal canal: No prevertebral fluid or swelling. No visible canal hematoma. Disc levels: Multilevel loss of  disc space height appears worst at C3-4 and C5-6. Upper chest: Lung apices are clear. Other: None. IMPRESSION: Large scalp hematoma posteriorly on the right. No other acute abnormality head or cervical spine. Mild atrophy and chronic microvascular ischemic change. Cervical spondylosis. Electronically Signed   By: Inge Rise M.D.   On: 06/19/2017 17:20    Procedures Procedures (including critical care time)  LACERATION REPAIR Performed by: Clayton Bibles Authorized by: Clayton Bibles Consent: Verbal consent obtained. Risks and benefits: risks, benefits and alternatives were discussed Consent given by: patient Patient identity confirmed: provided demographic data Prepped and Draped in normal sterile fashion Wound explored  Laceration Location: right parietal scalp  Laceration Length: 7cm  No Foreign Bodies seen or palpated  Anesthesia: local infiltration  Local anesthetic: lidocaine 2% with epinephrine  Anesthetic total: 8 ml  Irrigation method: syringe Amount of cleaning: standard  Skin closure: staples  Number of sutures: 8  Technique: staples   Patient tolerance: Patient tolerated the procedure well with no immediate complications.   Medications Ordered in ED Medications  fentaNYL (SUBLIMAZE) injection 50 mcg (50 mcg Intravenous Given 06/19/17 1723)  sodium chloride 0.9 % bolus 500 mL (0 mLs Intravenous Stopped 06/19/17 1901)  meclizine (ANTIVERT) tablet 25 mg (25 mg Oral Given 06/19/17 1722)  lidocaine-EPINEPHrine (XYLOCAINE W/EPI) 2 %-1:200000 (PF) injection 10 mL (10 mLs Infiltration Given 06/19/17 2000)     Initial Impression / Assessment and Plan / ED Course  I have reviewed the triage vital signs and the nursing notes.  Pertinent labs & imaging results that were available during my care of the patient were reviewed by me and considered in my medical decision making (see chart for details).     Afebrile, nontoxic patient with chronic vertigo, did not take  medication this morning and had increase in chronic symptoms causing her to fall.  Hematoma and laceration to right posterior scalp.  CT head and c-spine negative.  Workup unremarkable.  Pt clinically slightly dehydrated, IVF given.  Tolerating PO.   D/C home with encouraged hydration, PO meclizine, continued walker use, PCP follow up in 7-10 days for wound assessment and staple removal.  Discussed return precautions given head injury.  Discussed result, findings, treatment, and follow up  with patient.  Pt given return precautions.  Pt verbalizes understanding and agrees with plan.       Final Clinical Impressions(s) / ED Diagnoses   Final diagnoses:  Vertigo  Injury of  head, initial encounter  Laceration of scalp, initial encounter    New Prescriptions Discharge Medication List as of 06/19/2017  8:15 PM       Clayton Bibles, Hershal Coria 06/19/17 2247    Fatima Blank, MD 06/20/17 [id number]

## 2017-06-19 NOTE — ED Notes (Signed)
Bed: WA25 Expected date:  Expected time:  Means of arrival:  Comments: EMS-fall-head lac 

## 2017-06-19 NOTE — Discharge Instructions (Signed)
Read the information below.  You may return to the Emergency Department at any time for worsening condition or any new symptoms that concern you.  If you develop redness, swelling, pus draining from the wound, or fevers greater than 100.4, return to the ER immediately for a recheck.    Please take you regular vertigo medications.  Eat and drink well to stay hydrated.  Use your walker for support at all times.  You have had a head injury which does not appear to require admission at this time. A concussion is a state of changed mental ability from trauma. SEEK IMMEDIATE MEDICAL ATTENTION IF: There is confusion or drowsiness (although children frequently become drowsy after injury).  You cannot awaken the injured person.  There is nausea (feeling sick to your stomach) or continued, forceful vomiting.  You notice dizziness or unsteadiness which is getting worse, or inability to walk.  You have convulsions or unconsciousness.  You experience severe, persistent headaches not relieved by Tylenol?. (Do not take aspirin as this impairs clotting abilities). Take other pain medications only as directed.  You cannot use arms or legs normally.  There are changes in pupil sizes. (This is the black center in the colored part of the eye)  There is clear or bloody discharge from the nose or ears.  Change in speech, vision, swallowing, or understanding.  Localized weakness, numbness, tingling, or change in bowel or bladder control.

## 2017-06-19 NOTE — ED Triage Notes (Signed)
Pt comes from home via EMS with complaints of a fall from vertigo.  Pt reports she has chronic issues with vertigo but was unable to gain her balance this time.  Pt hit the back of her head on the wall as she fell.  A&O x4.  Denies LOC.  Pt was able to crawl from the floor to the kitchen to call EMS. Knot on head with laceration on that knot. CCA cleared by EMS. No blood thinner use.  Pain scale 5/10. No CBG obtained in route.

## 2017-09-13 ENCOUNTER — Encounter: Payer: Self-pay | Admitting: Neurology

## 2017-09-13 ENCOUNTER — Ambulatory Visit (INDEPENDENT_AMBULATORY_CARE_PROVIDER_SITE_OTHER): Payer: Medicare Other | Admitting: Neurology

## 2017-09-13 ENCOUNTER — Encounter (INDEPENDENT_AMBULATORY_CARE_PROVIDER_SITE_OTHER): Payer: Self-pay

## 2017-09-13 VITALS — BP 151/76 | HR 67 | Ht 61.0 in | Wt 165.5 lb

## 2017-09-13 DIAGNOSIS — R269 Unspecified abnormalities of gait and mobility: Secondary | ICD-10-CM | POA: Diagnosis not present

## 2017-09-13 DIAGNOSIS — R42 Dizziness and giddiness: Secondary | ICD-10-CM | POA: Diagnosis not present

## 2017-09-13 DIAGNOSIS — G3281 Cerebellar ataxia in diseases classified elsewhere: Secondary | ICD-10-CM

## 2017-09-13 NOTE — Patient Instructions (Signed)
[place] at [facility] 1.0 1 Google review Physical therapist in [place], [place] Address: [address], [place], [place] [address] Hours:  Open ? Closes 1:30PM ? Reopens 2PM Phone: [phone]

## 2017-09-13 NOTE — Progress Notes (Signed)
PATIENT: [name] DOB: 1932-12-03  Chief Complaint  Patient presents with  . Dizziness    Orthostatic Vitals:  Lying: 151/76, 67, Sitting:145/75, 69, Standing: 134/74, 70, Standing x 3 min: 150/72, 62. Reports significant episode of vertigo that prompted her to see ENT.  Symptoms were improved with the Epley Maneuver.  She is still having some residual dizziness that concerns her.      Marland Kitchen PCP    Cari Caraway, MD     HISTORICAL  [name] is 83 years old female, seen in refer by her primary care doctor  Cari Caraway for evaluation of dizziness, initial evaluation was on September 13 2017.  I reviewed and summarized the referring note, she has past medical history of depression anxiety,  vitamin D deficiency, longtime smoker, hyperlipidemia, COPD, peripheral artery disease.  Woke up one morning, she presented with sudden onset of vertigo in June 2018, she has nausea, blurry vision, unsteady gait, has to lie in bed for 2 or 3 days, then she was able to drive herself to primary care physician, was later referred to ENT, had repositional maneuver, which has helped her headaches.  But since then, she still has unsteady gait, she fell on June 19 2017, has skull abrasion. She was also found to have high-frequency hearing loss  She has to rely on the walker now, has to be careful with driving.  Laboratory in July 2018, CBC was normal, BMP was normal  REVIEW OF SYSTEMS: Full 14 system review of systems performed and notable only for weight gain, fatigue, murmur, hearing loss, moles, shortness of breath, snoring, constipation, joint pain, swelling, aching muscles, confusion, insomnia, snoring  ALLERGIES: Allergies  Allergen Reactions  . Adhesive [Tape] Itching and Other (See Comments)    Little red itchy bumps develop  . Latex Itching and Other (See Comments)    Little red itchy bumps develop    HOME MEDICATIONS: Current Outpatient Prescriptions  Medication Sig Dispense  Refill  . acetaminophen (TYLENOL 8 HOUR) 650 MG CR tablet Take 650 mg by mouth every 8 (eight) hours as needed (pain).     . ALBUTEROL IN Inhale into the lungs as needed.    Marland Kitchen aspirin EC 81 MG tablet Take 81 mg by mouth daily.    Marland Kitchen atorvastatin (LIPITOR) 10 MG tablet Take 10 mg by mouth at bedtime.    Marland Kitchen buPROPion (WELLBUTRIN XL) 150 MG 24 hr tablet Take 150 mg by mouth daily.    . Cholecalciferol (VITAMIN D) 2000 units tablet Take 2,000 Units by mouth daily.    Marland Kitchen escitalopram (LEXAPRO) 10 MG tablet Take 40 mg by mouth daily.     . Multiple Vitamin (MULTIVITAMIN WITH MINERALS) TABS tablet Take 1 tablet by mouth daily.    Marland Kitchen tiotropium (SPIRIVA) 18 MCG inhalation capsule Place 18 mcg into inhaler and inhale daily.     No current facility-administered medications for this visit.     PAST MEDICAL HISTORY: Past Medical History:  Diagnosis Date  . Anxiety   . Back pain   . Depression   . Emphysema   . Esophageal spasm   . Lung nodule   . OSA (obstructive sleep apnea)   . Osteomalacia   . Vertigo   . Vitamin D deficiency     PAST SURGICAL HISTORY: Past Surgical History:  Procedure Laterality Date  . APPENDECTOMY    . BREAST EXCISIONAL BIOPSY Left 1978  . CARPAL TUNNEL RELEASE    . ROTATOR CUFF  REPAIR      FAMILY HISTORY: Family History  Problem Relation Age of Onset  . Diabetes type II Unknown   . Hypertension Unknown   . Breast cancer Maternal Aunt   . Other Mother        died of natural causes at age 56  . Other Father        stroke or heart attack while in shower    SOCIAL HISTORY:  Social History   Social History  . Marital status: Widowed    Spouse name: N/A  . Number of children: 4  . Years of education: Bachelors   Occupational History  . Retired    Social History Main Topics  . Smoking status: Former Smoker    Packs/day: 1.00    Types: Cigarettes    Quit date: 03/2016  . Smokeless tobacco: Never Used  . Alcohol use No  . Drug use: No  . Sexual  activity: Not Currently   Other Topics Concern  . Not on file   Social History Narrative   Lives at home alone.   Right-handed.   1.5 cups caffeine per day, occasional Diet Coke.     PHYSICAL EXAM   Vitals:   09/13/17 0746  BP: (!) 151/76  Pulse: 67  Weight: 165 lb 8 oz (75.1 kg)  Height: 5\' 1"  (1.549 m)    Not recorded      Body mass index is 31.27 kg/m.  PHYSICAL EXAMNIATION:  Gen: NAD, conversant, well nourised, obese, well groomed                     Cardiovascular: Regular rate rhythm, no peripheral edema, warm, nontender. Eyes: Conjunctivae clear without exudates or hemorrhage Neck: Supple, no carotid bruits. Pulmonary: Clear to auscultation bilaterally   NEUROLOGICAL EXAM:  MENTAL STATUS: Speech:    Speech is normal; fluent and spontaneous with normal comprehension.  Cognition:     Orientation to time, place and person     Normal recent and remote memory     Normal Attention span and concentration     Normal Language, naming, repeating,spontaneous speech     Fund of knowledge   CRANIAL NERVES: CN II: Visual fields are full to confrontation. Fundoscopic exam is normal with sharp discs and no vascular changes. Pupils are round equal and briskly reactive to light. CN III, IV, VI: extraocular movement are normal. No ptosis. CN V: Facial sensation is intact to pinprick in all 3 divisions bilaterally. Corneal responses are intact.  CN VII: Face is symmetric with normal eye closure and smile. CN VIII: Hearing is normal to rubbing fingers CN IX, X: Palate elevates symmetrically. Phonation is normal. CN XI: Head turning and shoulder shrug are intact CN XII: Tongue is midline with normal movements and no atrophy.  MOTOR: There is no pronator drift of out-stretched arms. Muscle bulk and tone are normal. Muscle strength is normal.  REFLEXES: Reflexes are 2+ and symmetric at the biceps, triceps, knees, and ankles. Plantar responses are  flexor.  SENSORY: Intact to light touch, pinprick, positional sensation and vibratory sensation are intact in fingers and toes.  COORDINATION: Left hand discoordination on rapid movement, moderate trunk ataxia  GAIT/STANCE: Need to push on chair arm to get up from seated position, trunk ataxia, unsteady, wide-based,   DIAGNOSTIC DATA (LABS, IMAGING, TESTING) - I reviewed patient records, labs, notes, testing and imaging myself where available.   ASSESSMENT AND PLAN  [name] is a 83 y.o.  female   Acute onset of vertigo, gait abnormality in June 2018,  Multiple vascular risk factors, aging, longtime smoker, hyperlipidemia, peripheral vascular disease  Possible brainstem/cerebellar stroke  Complete evaluation with MRI of the brain  Refer her to physical therapy  Keep daily aspirin   Marcial Pacas, M.D. Ph.D.  [facility] [address] [place], [place] [address] Ph: [phone] Fax: [phone]  CC: Cari Caraway, MD

## 2017-09-15 ENCOUNTER — Emergency Department (HOSPITAL_COMMUNITY): Payer: Medicare Other

## 2017-09-15 ENCOUNTER — Emergency Department (HOSPITAL_COMMUNITY)
Admission: EM | Admit: 2017-09-15 | Discharge: 2017-09-15 | Disposition: A | Discharge status: Home / Self Care | Payer: Medicare Other | Attending: Emergency Medicine | Admitting: Emergency Medicine

## 2017-09-15 ENCOUNTER — Encounter (HOSPITAL_COMMUNITY): Payer: Self-pay | Admitting: Emergency Medicine

## 2017-09-15 DIAGNOSIS — R059 Cough, unspecified: Secondary | ICD-10-CM

## 2017-09-15 DIAGNOSIS — R109 Unspecified abdominal pain: Secondary | ICD-10-CM | POA: Insufficient documentation

## 2017-09-15 DIAGNOSIS — R05 Cough: Secondary | ICD-10-CM | POA: Insufficient documentation

## 2017-09-15 DIAGNOSIS — Z91040 Latex allergy status: Secondary | ICD-10-CM | POA: Diagnosis not present

## 2017-09-15 DIAGNOSIS — Z87891 Personal history of nicotine dependence: Secondary | ICD-10-CM | POA: Diagnosis not present

## 2017-09-15 DIAGNOSIS — R42 Dizziness and giddiness: Secondary | ICD-10-CM

## 2017-09-15 DIAGNOSIS — F419 Anxiety disorder, unspecified: Secondary | ICD-10-CM | POA: Insufficient documentation

## 2017-09-15 DIAGNOSIS — Z7982 Long term (current) use of aspirin: Secondary | ICD-10-CM | POA: Insufficient documentation

## 2017-09-15 DIAGNOSIS — F329 Major depressive disorder, single episode, unspecified: Secondary | ICD-10-CM | POA: Insufficient documentation

## 2017-09-15 DIAGNOSIS — Z79899 Other long term (current) drug therapy: Secondary | ICD-10-CM | POA: Diagnosis not present

## 2017-09-15 LAB — CBC
HCT: 42 % (ref 36.0–46.0)
Hemoglobin: 14.2 g/dL (ref 12.0–15.0)
MCH: 31.4 pg (ref 26.0–34.0)
MCHC: 33.8 g/dL (ref 30.0–36.0)
MCV: 92.9 fL (ref 78.0–100.0)
Platelets: 152 10*3/uL (ref 150–400)
RBC: 4.52 MIL/uL (ref 3.87–5.11)
RDW: 13.1 % (ref 11.5–15.5)
WBC: 6.2 10*3/uL (ref 4.0–10.5)

## 2017-09-15 LAB — BASIC METABOLIC PANEL
Anion gap: 8 (ref 5–15)
BUN: 14 mg/dL (ref 6–20)
CO2: 26 mmol/L (ref 22–32)
Calcium: 8.9 mg/dL (ref 8.9–10.3)
Chloride: 104 mmol/L (ref 101–111)
Creatinine, Ser: 0.73 mg/dL (ref 0.44–1.00)
GFR calc Af Amer: >60 mL/min (ref >60)
GFR calc non Af Amer: >60 mL/min (ref >60)
Glucose, Bld: 117 mg/dL — ABNORMAL HIGH (ref 65–99)
Potassium: 3.6 mmol/L (ref 3.5–5.1)
Sodium: 138 mmol/L (ref 135–145)

## 2017-09-15 LAB — I-STAT TROPONIN, ED: Troponin i, poc: 0.01 ng/mL (ref 0.00–0.08)

## 2017-09-15 LAB — CBG MONITORING, ED: Glucose-Capillary: 159 mg/dL — ABNORMAL HIGH (ref 65–99)

## 2017-09-15 MED ORDER — LORAZEPAM 2 MG/ML IJ SOLN
1.0000 mg | Freq: Once | INTRAMUSCULAR | Status: AC
  Administered 2017-09-15: 1 mg via INTRAVENOUS
  Filled 2017-09-15: qty 1

## 2017-09-15 MED ORDER — MECLIZINE HCL 25 MG PO TABS
25.0000 mg | ORAL_TABLET | Freq: Once | ORAL | Status: AC
  Administered 2017-09-15: 25 mg via ORAL
  Filled 2017-09-15: qty 1

## 2017-09-15 MED ORDER — MECLIZINE HCL 25 MG PO TABS
25.0000 mg | ORAL_TABLET | Freq: Three times a day (TID) | ORAL | 0 refills | Status: DC | PRN
Start: 2017-09-15 — End: 2020-09-08

## 2017-09-15 MED ORDER — SODIUM CHLORIDE 0.9 % IV BOLUS (SEPSIS)
1000.0000 mL | Freq: Once | INTRAVENOUS | Status: AC
  Administered 2017-09-15: 1000 mL via INTRAVENOUS

## 2017-09-15 NOTE — ED Notes (Signed)
Patient transported to X-ray 

## 2017-09-15 NOTE — ED Notes (Addendum)
Attempted to collect UA. Pt stated she just used restroom in waiting room. Will attempt to collect in 30 Mins

## 2017-09-15 NOTE — Discharge Instructions (Signed)
Take meclizine as needed for dizziness.   Stay hydrated.   See your neurologist next week for follow up   Return to ER if you have worse dizziness, unable to walk, falling, weakness, trouble speaking.

## 2017-09-15 NOTE — ED Provider Notes (Signed)
[facility] DEPT Provider Note   CSN: [id number] Arrival date & time: 09/15/17  1041     History   Chief Complaint Chief Complaint  Patient presents with  . Weakness  . Cough  . Dizziness    HPI [name] is a 83 y.o. female history of depression, COPD, chronic vertigo here presenting with worsening dizziness. Patient had vertigo ongoing for the last several month and had Epley maneuver done but still felt dizzy. She actually saw neurologist 2 days ago and was scheduled for an outpatient MRI. For the last 2 days, patient has been having mild productive cough but no fevers. She also has been in bed for the last 2 days because she was afraid to get up because she felt so dizzy when she tries to. Denies falling denies any focal weakness or trouble speaking. Denies history of stroke in the past.  The history is provided by the patient.    Past Medical History:  Diagnosis Date  . Anxiety   . Back pain   . Depression   . Emphysema   . Esophageal spasm   . Lung nodule   . OSA (obstructive sleep apnea)   . Osteomalacia   . Vertigo   . Vitamin D deficiency     Patient Active Problem List   Diagnosis Date Noted  . Vertigo 09/13/2017  . Abnormal finding on imaging 04/22/2012  . Dyslipidemia 04/22/2012  . Depression 04/22/2012  . Anxiety 04/22/2012  . Tobacco abuse 04/22/2012  . COPD (chronic obstructive pulmonary disease) ([place]) 04/22/2012  . Gait abnormality 04/22/2012    Past Surgical History:  Procedure Laterality Date  . APPENDECTOMY    . BREAST EXCISIONAL BIOPSY Left 1978  . CARPAL TUNNEL RELEASE    . ROTATOR CUFF REPAIR      OB History    No data available       Home Medications    Prior to Admission medications   Medication Sig Start Date End Date Taking? Authorizing Provider  albuterol (PROVENTIL HFA;VENTOLIN HFA) 108 (90 Base) MCG/ACT inhaler Inhale 2 puffs into the lungs every 6 (six) hours as needed for wheezing or shortness of breath.   Yes  [provider]  aspirin EC 81 MG tablet Take 81 mg by mouth daily.   Yes [provider]  atorvastatin (LIPITOR) 10 MG tablet Take 10 mg by mouth at bedtime.   Yes [provider]  buPROPion (WELLBUTRIN XL) 150 MG 24 hr tablet Take 150 mg by mouth daily.   Yes [provider]  Cholecalciferol (VITAMIN D) 2000 units tablet Take 4,000 Units by mouth daily.    Yes [provider]  escitalopram (LEXAPRO) 20 MG tablet Take 40 mg by mouth daily. 08/01/17  Yes [provider]  fluticasone (FLONASE) 50 MCG/ACT nasal spray Place 2 sprays into both nostrils daily.   Yes [provider]  meclizine (ANTIVERT) 25 MG tablet Take 25 mg by mouth 3 (three) times daily as needed for dizziness.   Yes [provider]  Multiple Vitamin (MULTIVITAMIN WITH MINERALS) TABS tablet Take 1 tablet by mouth daily.   Yes [provider]  tiotropium (SPIRIVA) 18 MCG inhalation capsule Place 18 mcg into inhaler and inhale daily.   Yes [provider]  traMADol (ULTRAM) 50 MG tablet Take 50 mg by mouth daily.   Yes [provider]  acetaminophen (TYLENOL 8 HOUR) 650 MG CR tablet Take 650 mg by mouth every 8 (eight) hours as needed (pain).  [provider]    Family History Family History  Problem Relation Age of Onset  . Diabetes type II Unknown   . Hypertension Unknown   . Breast cancer Maternal Aunt   . Other Mother        died of natural causes at age 39  . Other Father        stroke or heart attack while in shower    Social History Social History  Substance Use Topics  . Smoking status: Former Smoker    Packs/day: 1.00    Types: Cigarettes    Quit date: 03/2016  . Smokeless tobacco: Never Used  . Alcohol use No     Allergies   Adhesive [tape] and Latex   Review of Systems Review of Systems  Neurological: Positive for dizziness.  All other systems reviewed and are negative.    Physical  Exam Updated Vital Signs BP (!) 140/58   Pulse 63   Temp 98.4 F (36.9 C) (Oral)   Resp 17   SpO2 98%   Physical Exam  Constitutional: She is oriented to person, place, and time.  Uncomfortable   HENT:  Head: Normocephalic.  Mouth/Throat: Oropharynx is clear and moist.  Eyes:  Difficulty opening eyes due to photophobia. ? Mild L nystagmus   Neck: Normal range of motion. Neck supple.  Cardiovascular: Normal rate, regular rhythm and normal heart sounds.   Pulmonary/Chest: Effort normal and breath sounds normal. No respiratory distress. She has no wheezes. She has no rales.  Abdominal: Soft. Bowel sounds are normal. She exhibits no distension. There is no tenderness. There is no guarding.  L side incisional hernia that is reducible, R femoral hernia that is reducible and nontender   Musculoskeletal: Normal range of motion.  Neurological: She is alert and oriented to person, place, and time.  CN 2- 12 intact. Nl strength throughout. Nl finger to nose   Skin: Skin is warm.  Psychiatric: She has a normal mood and affect.  Nursing note and vitals reviewed.    ED Treatments / Results  Labs (all labs ordered are listed, but only abnormal results are displayed) Labs Reviewed  BASIC METABOLIC PANEL - Abnormal; Notable for the following:       Result Value   Glucose, Bld 117 (*)    All other components within normal limits  CBG MONITORING, ED - Abnormal; Notable for the following:    Glucose-Capillary 159 (*)    All other components within normal limits  CBC  URINALYSIS, ROUTINE W REFLEX MICROSCOPIC  I-STAT TROPONIN, ED    EKG  EKG Interpretation  Date/Time:  Saturday September 15 2017 10:49:23 EDT Ventricular Rate:  71 PR Interval:  150 QRS Duration: 82 QT Interval:  366 QTC Calculation: 397 R Axis:   -2 Text Interpretation:  Normal sinus rhythm Nonspecific ST and T wave abnormality Abnormal ECG No significant change since last tracing Confirmed by Wandra Arthurs [phone])  on 09/15/2017 1:02:16 PM       Radiology Dg Chest 2 View  Result Date: 09/15/2017 CLINICAL DATA:  83 year old female with a history of hernia. Dizziness EXAM: CHEST  2 VIEW COMPARISON:  07/04/2013 FINDINGS: Mean cardiomediastinal silhouette within normal limits, unchanged from prior. Calcifications of the aortic arch. No pneumothorax or pleural effusion.  No confluent airspace disease. Pectus deformity on the lateral view again demonstrated. Coarsened interstitial markings bilaterally, similar prior. No displaced fracture. IMPRESSION: Chronic lung changes without evidence of superimposed acute cardiopulmonary disease. Pectus excavatum. Electronically Signed  By: Corrie Mckusick D.O.   On: 09/15/2017 13:41   Ct Head Wo Contrast  Result Date: 09/15/2017 CLINICAL DATA:  83 year old female with a history of cough weakness and dizziness EXAM: CT HEAD WITHOUT CONTRAST TECHNIQUE: Contiguous axial images were obtained from the base of the skull through the vertex without intravenous contrast. COMPARISON:  CT 06/19/2017, FINDINGS: Brain: No acute intracranial hemorrhage. No midline shift or mass effect. Unremarkable configuration of the ventricles. Gray-white differentiation is relatively maintained. Vascular: Calcifications of the anterior circulation. Skull: No fracture. No aggressive bony lesions. Unremarkable appearance of the scalp soft tissues. Sinuses/Orbits: Unremarkable globes.  No significant sinus disease. Other: None IMPRESSION: No CT evidence of acute intracranial abnormality. Electronically Signed   By: Corrie Mckusick D.O.   On: 09/15/2017 13:48   Dg Abd 2 Views  Result Date: 09/15/2017 CLINICAL DATA:  83 year old female with a history of hernia EXAM: ABDOMEN - 2 VIEW COMPARISON:  CT 07/01/2012 FINDINGS: Gas within stomach, small bowel, colon. Evaluation of the abdomen limited secondary to positioning of the legs. No abnormally distended small bowel or colon. No unexpected calcification.  No  unexpected soft tissue density. Mild scoliotic curvature of the thoracolumbar spine, with degenerative disc disease. IMPRESSION: Nonobstructive bowel gas pattern. Electronically Signed   By: Corrie Mckusick D.O.   On: 09/15/2017 13:42    Procedures Procedures (including critical care time)  Medications Ordered in ED Medications  sodium chloride 0.9 % bolus 1,000 mL (1,000 mLs Intravenous New Bag/Given 09/15/17 1702)  meclizine (ANTIVERT) tablet 25 mg (25 mg Oral Given 09/15/17 1450)  LORazepam (ATIVAN) injection 1 mg (1 mg Intravenous Given 09/15/17 1448)  LORazepam (ATIVAN) injection 1 mg (1 mg Intravenous Given 09/15/17 1702)     Initial Impression / Assessment and Plan / ED Course  I have reviewed the triage vital signs and the nursing notes.  Pertinent labs & imaging results that were available during my care of the patient were reviewed by me and considered in my medical decision making (see chart for details).    [name] is a 83 y.o. female here with dizziness. Hx of BPPV requiring epley's maneuver which didn't help much. Worsening dizziness for 2 days and scheduled for outpatient MRI to r/o stroke. Unable to get up and walk due to dizziness. Will get MRI brain to r/o posterior circulation stroke. Will give meclizine and ativan and IVF and reassess.   4:15 pm  Able to ambulate to the bathroom now. Labs and CT head unremarkable. MRI pending.   5:04 PM MRI pending. Signed out to Dr. Leonette Monarch. Anticipate dc home if MRI negative. Has neurology follow up outpatient.    Final Clinical Impressions(s) / ED Diagnoses   Final diagnoses:  Abdominal pain    New Prescriptions New Prescriptions   No medications on file     Drenda Freeze, MD 09/15/17 1704

## 2017-09-15 NOTE — ED Notes (Signed)
Patient transported to MRI 

## 2017-09-15 NOTE — ED Triage Notes (Signed)
Pt. Stated, I have a cough and for the last 2 days I've had weakness and dizziness to the point I was afraid to get up.

## 2017-09-15 NOTE — ED Provider Notes (Signed)
I assumed care of this patient from Dr. Darl Householder at 1700.  Please see their note for further details of Hx, PE.  Briefly patient is a 83 y.o. female who presents with vertiginous symptoms that of an ongoing for several weeks. Patient has been evaluated by neurology to was going to schedule patient for an MRI. His symptoms have been debilitating. However did appear to have improved with treatment here in the emergency department. Given the schedule MRI, Dr. Darl Householder ordered an MRI here to rule out CVA. Current plan is to await MRI results.  MRI negative.  Patient was able to ambulate without much competition.  The patient is safe for discharge with strict return precautions.   Disposition: Discharge  Condition: Good  I have discussed the results, Dx and Tx plan with the patient and daughter who expressed understanding and agree(s) with the plan. Discharge instructions discussed at great length. The patient and daughter was given strict return precautions who verbalized understanding of the instructions. No further questions at time of discharge.    Discharge Medication List as of 09/15/2017  6:51 PM      Follow Up: Cari Caraway, [place] [address] [phone]  Schedule an appointment as soon as possible for a visit  If symptoms do not improve or  worsen      Dnya Hickle, Grayce Sessions, MD 09/15/17 2029

## 2017-09-19 ENCOUNTER — Telehealth: Payer: Self-pay | Admitting: Neurology

## 2017-09-19 NOTE — Telephone Encounter (Signed)
Patients daughter Darrick Penna (Listed on [place]) called office to notify Dr. Krista Blue she had to take her mother to the hospital this past weekend where patient had a MRI of brain done.  [name] would like to know if Dr. Krista Blue will review the results and call her directly.  Please call

## 2017-09-19 NOTE — Telephone Encounter (Signed)
Left message requesting a return call.

## 2017-09-20 NOTE — Telephone Encounter (Signed)
Spoke to pt's daughter on HIPAA.  Dr. Krista Blue has reviewed her MRI and she is aware of the following results: IMPRESSION: 1. No acute intracranial abnormality identified. 2. Mild age-related cerebral atrophy with chronic small vessel ischemic disease. Her mother is going to proceed with PT.

## 2017-09-23 ENCOUNTER — Inpatient Hospital Stay: Admission: RE | Admit: 2017-09-23 | Payer: Medicare Other | Source: Ambulatory Visit

## 2017-10-05 ENCOUNTER — Ambulatory Visit: Payer: Medicare Other

## 2017-10-11 ENCOUNTER — Ambulatory Visit: Payer: Medicare Other | Attending: Neurology | Admitting: Rehabilitative and Restorative Service Providers"

## 2017-10-11 DIAGNOSIS — M6281 Muscle weakness (generalized): Secondary | ICD-10-CM | POA: Diagnosis not present

## 2017-10-11 DIAGNOSIS — H8113 Benign paroxysmal vertigo, bilateral: Secondary | ICD-10-CM | POA: Insufficient documentation

## 2017-10-11 DIAGNOSIS — R2689 Other abnormalities of gait and mobility: Secondary | ICD-10-CM

## 2017-10-11 NOTE — Therapy (Signed)
[name] [address] [place], [place], [address] Phone: [phone]   Fax:  [phone]  Physical Therapy Evaluation  Patient Details  Name: YOUSRA Taylor MRN: [id number] Date of Birth: 1932/04/20 Referring Provider: Marcial Pacas, MD  Encounter Date: 10/11/2017      PT End of Session - 10/11/17 2012    Visit Number 1   Number of Visits 12   Date for PT Re-Evaluation 12/10/17   Authorization Type UHC medicare   PT Start Time 0930   PT Stop Time 1015   PT Time Calculation (min) 45 min   Activity Tolerance Patient tolerated treatment well;Other (comment)  nausea with tx of BPPV   Behavior During Therapy [facility] for tasks assessed/performed      Past Medical History:  Diagnosis Date  . Anxiety   . Back pain   . Depression   . Emphysema   . Esophageal spasm   . Lung nodule   . OSA (obstructive sleep apnea)   . Osteomalacia   . Vertigo   . Vitamin D deficiency     Past Surgical History:  Procedure Laterality Date  . APPENDECTOMY    . BREAST EXCISIONAL BIOPSY Left 1978  . CARPAL TUNNEL RELEASE    . ROTATOR CUFF REPAIR      There were no vitals filed for this visit.       Subjective Assessment - 10/11/17 0936    Subjective The patient reports "I don't know when it started because it went on so long."  She awoke with room spinning vertigo that was constant in nature and lasted all day.  She began taking OTC bonine and went to [name] who provided her with home exercises that reduced symptoms.  The dizziness returned and she was sent to [facility] clinic and underwent Epley's maneuver, but continued with dizziness.  She had a fall hitting her head on the refrigerator June 19, 2017.  "Donnald Garre been trying to get over that."  She also notes some episodes of vision blacking out and blurred vision, leading to her quitting reading.  She got her eyes checked--noting scar tissue from cateract surgery and underwent  laser surgery to repair scar tissue.  She saw ENT and had Epley's treatment and it reduced symptoms.  Hearing tests revealed dec'd high frequency hearing.   The dizziness is still present "feels a wave of nausea" when lying down, but not as severe.  She has avoided driving since June 7253 due to fear dizziness will begin.   Her cc: nausea with head movement, she feels like this is different than what the Epley's maneuver treated.   Pertinent History vitamin D deficiency, sleep apnea, back pain (can be severe at times per patient), COPD, anxiety, depression   Patient Stated Goals "I have just started walking without a walker and I would like to feel more secure on my feet."   Currently in Pain? Yes  chronic low back pain- PT to monitor but no goal to follow   Effect of Pain on Daily Activities Avoids bending over.            [facility] PT Assessment - 10/11/17 0944      Assessment   Medical Diagnosis Vertigo, abnormality of gait   Referring Provider Kirsten Pacas, MD   Onset Date/Surgical Date --  June 2018   Prior Therapy has undergone treatment with Epley's at another clinic per report and at ENT     Precautions  Precautions Fall     Restrictions   Weight Bearing Restrictions No     Balance Screen   Has the patient fallen in the past 6 months Yes   How many times? 1   Has the patient had a decrease in activity level because of a fear of falling?  Yes   Is the patient reluctant to leave their home because of a fear of falling?  Yes  unable to drive due to dizziness     [place] residence   Living Arrangements Alone   Type of [place] One level   [place] - 2 wheels  only uses intermittently   Additional Comments Used the walker when waking this morning.  Has life alert.     Prior Function   Level of Independence Independent     Cognition   Overall Cognitive Status Within Functional  Limits for tasks assessed     Observation/Other Assessments   Focus on Therapeutic Outcomes (FOTO)  40%   Other Surveys  Other Surveys   Dizziness Handicap Inventory [facility])  84%     Ambulation/Gait   Ambulation/Gait Yes   Ambulation/Gait Assistance 6: Modified independent (Device/Increase time)  slowed pace   Ambulation Distance (Feet) 100 Feet   Assistive device None   Ambulation Surface Level;Indoor   Gait velocity 2.18 ft/sec            Vestibular Assessment - 10/11/17 0949      Vestibular Assessment   General Observation Patient walks into cliic without device intermittently holding walls.     Symptom Behavior   Type of Dizziness Imbalance  Nausea with movement, "dizzy" at certain times   Frequency of Dizziness daily   Duration of Dizziness "may just last a minute"   Aggravating Factors --  rolling in bed, getting into/out of bed.   Relieving Factors Head stationary     Occulomotor Exam   Occulomotor Alignment Normal   Spontaneous Absent   Gaze-induced Absent   Smooth Pursuits Intact   Saccades Intact     Vestibulo-Occular Reflex   VOR 1 Head Only (x 1 viewing) At slow pace x 5 reps, patient able to maintain fixation without dizziness.  Head Impulse Test=positive to the left side for refixation saccade.    Comment Neck ROM=approximately to 60 degrees rotation.      Positional Testing   Dix-Hallpike Dix-Hallpike Left   Sidelying Test Sidelying Right;Sidelying Left   Horizontal Canal Testing Horizontal Canal Right;Horizontal Canal Left     Dix-Hallpike Left   Dix-Hallpike Left Duration Did L dix hallpike as first position of Epley's after sidelying test.  Nystagmus present in room light.   Dix-Hallpike Left Symptoms Upbeat, left rotatory nystagmus     Sidelying Right   Sidelying Right Duration 10 seconds "it stops as soon as I get settled". This position aggravates back pain, so patient rolls to supine after coming into R sielying.  "Feels whoozy" when I get  up.   Sidelying Right Symptoms --  unable to tell- patient closes eyes, c/o nausea     Sidelying Left   Sidelying Left Duration 14 seconds of nystagmus viewed in room light with c/o nausea and room spinning "it passes quickly"   Sidelying Left Symptoms Upbeat, left rotatory nystagmus     Horizontal Canal Right   Horizontal Canal Right Duration Patient rolled supine to R sidelying (let her initiate movement  due to back pain)   Horizontal Canal Right Symptoms Normal     Horizontal Canal Left   Horizontal Canal Left Duration Patient rolled to left   Horizontal Canal Left Symptoms Normal        Objective measurements completed on examination: See above findings.          [facility] Adult PT Treatment/Exercise - 10/11/17 0944      Self-Care   Self-Care Other Self-Care Comments   Other Self-Care Comments  PT recommended patient bring RW with her to therapy to reduce risk for falls.         Vestibular Treatment/Exercise - 10/11/17 0959      Vestibular Treatment/Exercise   Vestibular Treatment Provided Canalith Repositioning   Canalith Repositioning Epley Manuever Left;Epley Manuever Right      EPLEY MANUEVER RIGHT   Response Details  INitiated a R Epley's due to nystagmus viewed during L epley's, however the patient did not have nystagmyus in the right dix hallpike position after completing L epley's.      EPLEY MANUEVER LEFT   Number of Reps  1    RESPONSE DETAILS LEFT Noted significant upbeating, right rotary nystagmus in 2nd position.  Completed epley's.  Patient c/o nausea and rested to allow symptoms to settle.                PT Education - 10/11/17 2011    Education provided Yes   Education Details nature of BPPV; education on PT plan   Person(s) Educated Patient   Methods Explanation   Comprehension Verbalized understanding          PT Short Term Goals - 10/11/17 2017      PT SHORT TERM GOAL #1   Title The patient will be indep with HEP for  habituation, gaze adaptation, balance/strengthening as indicated.   Time 4   Period Weeks   Target Date 11/10/17     PT SHORT TERM GOAL #2   Title The patient will have negative positional testing indicating resolution of BPPV   Time 4   Period Weeks   Target Date 11/10/17     PT SHORT TERM GOAL #3   Title The patient will be further assessed on Berg and goal to follow.   Time 4   Period Weeks   Target Date 11/10/17     PT SHORT TERM GOAL #4   Title The patient will be further assessed on 5 times sit to stand.   Time 4   Period Weeks   Target Date 11/10/17           PT Long Term Goals - 10/11/17 2023      PT LONG TERM GOAL #1   Title The patient will reduce DHI from 84% to < or equal to 60% to demonstrate decreased self perception of vertigo.   Time 8   Period Weeks   Target Date 12/10/17     PT LONG TERM GOAL #2   Title The patient will improve gait speed from 2.18 ft/sec to > or equal to 2.6 ft/sec to demo improving safety with community gait.   Time 8   Period Weeks   Target Date 12/10/17     PT LONG TERM GOAL #3   Title The patient will return demonstrate HEP progression for balance, mobility and gaze adaptation.   Time 8   Period Weeks   Target Date 12/10/17     PT LONG TERM GOAL #4   Title The patient  will improve Berg balance score by 6 points to demonstrate improved balance.   Time 8   Period Weeks   Target Date 12/10/17     PT LONG TERM GOAL #5   Title The patient will improve 5 times sit<>stand by 4 seconds to demonstrate improving LE strength for functional mobility.   Time 8   Period Weeks   Target Date 12/10/17                Plan - 10/11/17 2032    Clinical Impression Statement The patient is an 83 yo female presenting to outpatient physical therapy with left posterior canal BPPV, possible R canal involvement (closes eyes with testing), motion sensitivity noting nausea with movements, decreased gaze adaptation indicating a left  vestibular hypofunction (per + Head impulse test), slowed gait, unsteadiness with ambulation and a recent fall.  PT to address deficitis to optimize functional mobility and reduce risk for falls.   History and Personal Factors relevant to plan of care: recent fall, not driving, multi-canal vertigo   Clinical Presentation Evolving   Clinical Presentation due to: Imbalance, declining community mobility due to symptoms   Clinical Decision Making Moderate   Rehab Potential Good   PT Frequency --  2x/week for 4 weeks, 1x/week for 4 weeks   PT Treatment/Interventions ADLs/Self Care Home Management;Canalith Repostioning;Patient/family education;Gait training;Therapeutic activities;Therapeutic exercise;Balance training;Functional mobility training;Vestibular   PT Next Visit Plan Positional testing, instruct in habituation if indicated; teach gaze x 1 and motion sensitivity home program; assess Berg and 5 times sit<>stand; mobility/fall prevention   Consulted and Agree with Plan of Care Patient      Patient will benefit from skilled therapeutic intervention in order to improve the following deficits and impairments:  Abnormal gait, Decreased activity tolerance, Decreased balance, Decreased mobility, Decreased strength, Dizziness, Pain  Visit Diagnosis: Muscle weakness (generalized)  Other abnormalities of gait and mobility  BPPV (benign paroxysmal positional vertigo), bilateral     Problem List Patient Active Problem List   Diagnosis Date Noted  . Vertigo 09/13/2017  . Abnormal finding on imaging 04/22/2012  . Dyslipidemia 04/22/2012  . Depression 04/22/2012  . Anxiety 04/22/2012  . Tobacco abuse 04/22/2012  . COPD (chronic obstructive pulmonary disease) ([place]) 04/22/2012  . Gait abnormality 04/22/2012    Nabeel Gladson, PT 10/11/2017, 8:40 PM  [facility] [address] [place], [place], [address] Phone: [phone]    Fax:  [phone]  Name: [name] MRN: [id number] Date of Birth: 01/23/1932

## 2017-10-18 ENCOUNTER — Ambulatory Visit: Payer: Medicare Other | Admitting: Neurology

## 2017-10-19 ENCOUNTER — Encounter: Payer: Self-pay | Admitting: *Deleted

## 2017-10-25 ENCOUNTER — Ambulatory Visit: Payer: Medicare Other | Admitting: Family

## 2017-10-25 ENCOUNTER — Encounter (HOSPITAL_COMMUNITY): Payer: Medicare Other

## 2017-10-30 ENCOUNTER — Ambulatory Visit: Payer: Medicare Other | Attending: Neurology | Admitting: Physical Therapy

## 2017-10-30 ENCOUNTER — Encounter: Payer: Self-pay | Admitting: Physical Therapy

## 2017-10-30 DIAGNOSIS — R2689 Other abnormalities of gait and mobility: Secondary | ICD-10-CM | POA: Diagnosis not present

## 2017-10-30 NOTE — Patient Instructions (Signed)
Standing Marching   Using a chair if necessary, march in place. Repeat 10 times each leg . Do 1 sessions per day.  http://gt2.exer.us/344   Copyright  VHI. All rights reserved.   Hip Backward Kick   Using a chair for balance, keep legs shoulder width apart and toes pointed for- ward. Slowly extend one leg back, keeping knee straight. Do not lean forward. Repeat with other leg. Repeat 10 times each leg alternating. Do 1 sessions per day.  http://gt2.exer.us/340   Copyright  VHI. All rights reserved.     Hip Side Kick   Holding a chair for balance, keep legs shoulder width apart and toes pointed forward. Swing a leg out to side, keeping knee straight. Do not lean. Repeat using other leg. Repeat 10  times. Do 1 sessions per day.  ALTERNATE LEGS   ALSO DO FORWARD KICKs - alternate legs -- 10 reps each leg     Feet Heel-Toe "Tandem", Varied Arm Positions - Eyes Open  ;PARTIAL HEEL TO TOE   With eyes open, right foot directly in front of the other, arms out, look straight ahead at a stationary object. Hold 30 seconds.  PARTIAL HEEL TO TOE Repeat 2  times per session. Do 1 sessions per day.  Copyright  VHI. All rights reserved.       Standing On One Leg Without Support .  Stand on one leg in neutral spine without support. Hold 10 seconds. Repeat on other leg. Do 1-2 repetitions, 1 sets.  http://bt.exer.us/36   Copyright  VHI. All rights reserved.    Side-Stepping   Walk to left side with eyes open. Take even steps, leading with same foot. Make sure each foot lifts off the floor. Repeat in opposite direction. Repeat for 1 minutes per session. Do 1 sessions per day.   Copyright  VHI. All rights reserved.     ANKLE SWAY - Stand at counter -- rock forward up on toes, then back on heels - hold as needed - trying to gradually hold less 10 reps  SQUATS - 10 reps    Sit to stand - 5-7 reps depending on back pain

## 2017-10-30 NOTE — Therapy (Signed)
[facility] [address] [place], [place], [address] Phone: [phone]   Fax:  [phone]  Physical Therapy Treatment  Patient Details  Name: [name] MRN: [id number] Date of Birth: [birth year] Referring Provider: Marcial Pacas, MD   Encounter Date: 10/30/2017  PT End of Session - 10/30/17 1643    Visit Number  2    Number of Visits  12    Date for PT Re-Evaluation  12/10/17    Authorization Type  [employer]HC medicare    PT Start Time  [phone]    PT Stop Time  1015    PT Time Calculation (min)  41 min       Past Medical History:  Diagnosis Date  . Anxiety   . Back pain   . Depression   . Emphysema   . Esophageal spasm   . Lung nodule   . OSA (obstructive sleep apnea)   . Osteomalacia   . Vertigo   . Vitamin D deficiency     Past Surgical History:  Procedure Laterality Date  . APPENDECTOMY    . BREAST EXCISIONAL BIOPSY Left 1978  . CARPAL TUNNEL RELEASE    . ROTATOR CUFF REPAIR      There were no vitals filed for this visit.  Subjective Assessment - 10/30/17 1617    Subjective  Pt states she has not tried to get earlier appt (last seen on 10-11-17) because she had such severe back pain after treatment last time - says she has had to stay in bed most of time since treatment on 10-11-17; went to granddaughters wedding in [place], [place] and had to stay in bed most of day yesterday due to the long trip resulting in back pain Pt states she wants to go back to ENT's office for Epley for treatment of BPPV because they had a 2nd person available to support her back which was much better than the way it was done here last time    Pertinent History  vitamin D deficiency, sleep apnea, back pain (can be severe at times per patient), COPD, anxiety, depression    Patient Stated Goals  "I have just started walking without a walker and I would like to feel more secure on my feet."                      [facility]  Adult PT Treatment/Exercise - 10/30/17 0951      Transfers   Transfers  Sit to Stand    Number of Reps  Other reps (comment) 1    Comments  Pt states she is unable to attempt the 5x sit to stand test due to incr. back pain      Ambulation/Gait   Ambulation/Gait  Yes    Ambulation/Gait Assistance  6: Modified independent (Device/Increase time)    Ambulation Distance (Feet)  50 Feet    Assistive device  Rolling walker    Gait Pattern  Step-through pattern    Ambulation Surface  Level;Indoor      Self-Care   Self-Care  Other Self-Care Comments Informed pt of Ex. program on local cable channel 13          Other Self-Care Comments   above exercise program recommended for incr. compliance and participation in exercise program          Balance Exercises - 10/30/17 1629      Balance Exercises: Standing   Other Standing Exercises  Pt instructed in balance  HEP - forward, back and side kicks, standing with feet together:           Pt also instructed in SLS on RLE and on LLE:  Standing in partial tandem stance x 30 secs Ankle sways x 10 reps with bil. UE support  Pt performed squats x 5 reps at counter for UE support prn  Instructed in sit to stand exercise (verbally only due to pt declining to perform due to back pain)      PT Short Term Goals - 10/11/17 2017      PT SHORT TERM GOAL #1   Title  The patient will be indep with HEP for habituation, gaze adaptation, balance/strengthening as indicated.    Time  4    Period  Weeks    Target Date  11/10/17      PT SHORT TERM GOAL #2   Title  The patient will have negative positional testing indicating resolution of BPPV    Time  4    Period  Weeks    Target Date  11/10/17      PT SHORT TERM GOAL #3   Title  The patient will be further assessed on Berg and goal to follow.    Time  4    Period  Weeks    Target Date  11/10/17      PT SHORT TERM GOAL #4   Title  The patient will be further assessed on 5 times sit to stand.     Time  4    Period  Weeks    Target Date  11/10/17        PT Long Term Goals - 10/11/17 2023      PT LONG TERM GOAL #1   Title  The patient will reduce DHI from 84% to < or equal to 60% to demonstrate decreased self perception of vertigo.    Time  8    Period  Weeks    Target Date  12/10/17      PT LONG TERM GOAL #2   Title  The patient will improve gait speed from 2.18 ft/sec to > or equal to 2.6 ft/sec to demo improving safety with community gait.    Time  8    Period  Weeks    Target Date  12/10/17      PT LONG TERM GOAL #3   Title  The patient will return demonstrate HEP progression for balance, mobility and gaze adaptation.    Time  8    Period  Weeks    Target Date  12/10/17      PT LONG TERM GOAL #4   Title  The patient will improve Berg balance score by 6 points to demonstrate improved balance.    Time  8    Period  Weeks    Target Date  12/10/17      PT LONG TERM GOAL #5   Title  The patient will improve 5 times sit<>stand by 4 seconds to demonstrate improving LE strength for functional mobility.    Time  8    Period  Weeks    Target Date  12/10/17            Plan - 10/30/17 1644    Clinical Impression Statement  Pt declined positional testing/treatment for BPPV due to c/o severe back pain which she says she experienced after previous treatment session (on 10-11-17).  Pt reporting severe back pain today due to travelling over the weekend -  declines attempting sit to stand test and declines Berg balance test - states she would just like to know exercises to do to work on her balance:  pt informed that standing balance would be impacted by vertigo (unresolved? at this time) and by limited standing tolerance due to c/o severe back pain                                                                             Rehab Potential  Good    PT Treatment/Interventions  ADLs/Self Care Home Management;Canalith Repostioning;Patient/family education;Gait  training;Therapeutic activities;Therapeutic exercise;Balance training;Functional mobility training;Vestibular    PT Next Visit Plan  Positional testing, instruct in habituation if indicated; teach gaze x 1 and motion sensitivity home program; assess Berg and 5 times sit<>stand; mobility/fall prevention Margreta Journey, she declined all this on 10-30-17 -- she states she is going back to ENT for BPPV tx)    PT Home Exercise Plan  balance exercises given on 10-30-17    Consulted and Agree with Plan of Care  Patient       Patient will benefit from skilled therapeutic intervention in order to improve the following deficits and impairments:  Abnormal gait, Decreased activity tolerance, Decreased balance, Decreased mobility, Decreased strength, Dizziness, Pain  Visit Diagnosis: Other abnormalities of gait and mobility     Problem List Patient Active Problem List   Diagnosis Date Noted  . Vertigo 09/13/2017  . Abnormal finding on imaging 04/22/2012  . Dyslipidemia 04/22/2012  . Depression 04/22/2012  . Anxiety 04/22/2012  . Tobacco abuse 04/22/2012  . COPD (chronic obstructive pulmonary disease) ([place]) 04/22/2012  . Gait abnormality 04/22/2012    DildayJenness Corner, PT 10/30/2017, 4:52 PM  [facility] [address] [place], [place], [address] Phone: [phone]   Fax:  [phone]  Name: [name] MRN: [id number] Date of Birth: 07/22/1932

## 2017-11-02 ENCOUNTER — Encounter: Payer: Medicare Other | Admitting: Cardiology

## 2017-11-02 NOTE — Progress Notes (Deleted)
Cardiology Office Note:    Date:  11/02/2017   ID:  [name], DOB 05-01-1932, MRN [id number]  PCP:  Cari Caraway, MD  Cardiologist:  Fransico Him, MD   Referring MD: Cari Caraway, MD   No chief complaint on file.   History of Present Illness:    [name] is a 83 y.o. female with a hx of ***  Past Medical History:  Diagnosis Date  . Anxiety   . Back pain   . Depression   . Emphysema   . Esophageal spasm   . Lung nodule   . OSA (obstructive sleep apnea)   . Osteomalacia   . Vertigo   . Vitamin D deficiency     Past Surgical History:  Procedure Laterality Date  . APPENDECTOMY    . BREAST EXCISIONAL BIOPSY Left 1978  . CARPAL TUNNEL RELEASE    . ROTATOR CUFF REPAIR      Current Medications: No outpatient medications have been marked as taking for the 11/02/17 encounter (Appointment) with Sueanne Margarita, MD.     Allergies:   Adhesive [tape] and Latex   Social History   Socioeconomic History  . Marital status: Widowed    Spouse name: Not on file  . Number of children: 4  . Years of education: Bachelors  . Highest education level: Not on file  Social Needs  . Financial resource strain: Not on file  . Food insecurity - worry: Not on file  . Food insecurity - inability: Not on file  . Transportation needs - medical: Not on file  . Transportation needs - non-medical: Not on file  Occupational History  . Occupation: Retired  Tobacco Use  . Smoking status: Former Smoker    Packs/day: 1.00    Types: Cigarettes    Last attempt to quit: 03/2016    Years since quitting: 1.6  . Smokeless tobacco: Never Used  Substance and Sexual Activity  . Alcohol use: No    Alcohol/week: 0.0 oz  . Drug use: No  . Sexual activity: Not Currently  Other Topics Concern  . Not on file  Social History Narrative   Lives at home alone.   Right-handed.   1.5 cups caffeine per day, occasional Diet Coke.     Family History: The patient's ***family history  includes Breast cancer in her maternal aunt; Diabetes type II in her unknown relative; Hypertension in her unknown relative; Other in her father and mother.  ROS:   Please see the history of present illness.    ROS  All other systems reviewed and negative.   EKGs/Labs/Other Studies Reviewed:    The following studies were reviewed today: ***  EKG:  EKG is *** ordered today.  The ekg ordered today demonstrates ***  Recent Labs: 09/15/2017: BUN 14; Creatinine, Ser 0.73; Hemoglobin 14.2; Platelets 152; Potassium 3.6; Sodium 138   Recent Lipid Panel No results found for: CHOL, TRIG, HDL, CHOLHDL, VLDL, LDLCALC, LDLDIRECT  Physical Exam:    VS:  There were no vitals taken for this visit.    Wt Readings from Last 3 Encounters:  09/13/17 165 lb 8 oz (75.1 kg)  06/19/17 160 lb (72.6 kg)  10/19/16 156 lb 1.6 oz (70.8 kg)     GEN: *** Well nourished, well developed in no acute distress HEENT: Normal NECK: No JVD; No carotid bruits LYMPHATICS: No lymphadenopathy CARDIAC: ***RRR, no murmurs, rubs, gallops RESPIRATORY:  Clear to auscultation without rales, wheezing or rhonchi  ABDOMEN: Soft, non-tender,  non-distended MUSCULOSKELETAL:  No edema; No deformity  SKIN: Warm and dry NEUROLOGIC:  Alert and oriented x 3 PSYCHIATRIC:  Normal affect   ASSESSMENT:    No diagnosis found. PLAN:    In order of problems listed above:  ***   Medication Adjustments/Labs and Tests Ordered: Current medicines are reviewed at length with the patient today.  Concerns regarding medicines are outlined above.  No orders of the defined types were placed in this encounter.  No orders of the defined types were placed in this encounter.   Signed, Fransico Him, MD  11/02/2017 1:47 PM    [place]

## 2017-11-03 NOTE — Progress Notes (Signed)
This encounter was created in error - please disregard.

## 2017-11-05 ENCOUNTER — Encounter: Payer: Medicare Other | Admitting: Rehabilitative and Restorative Service Providers"

## 2017-11-05 ENCOUNTER — Ambulatory Visit: Payer: Medicare Other | Admitting: Family

## 2017-11-05 ENCOUNTER — Ambulatory Visit (HOSPITAL_COMMUNITY)
Admission: RE | Admit: 2017-11-05 | Discharge: 2017-11-05 | Disposition: A | Discharge status: Home / Self Care | Payer: Medicare Other | Source: Ambulatory Visit | Attending: Family | Admitting: Family

## 2017-11-05 ENCOUNTER — Encounter: Payer: Self-pay | Admitting: Family

## 2017-11-05 VITALS — BP 153/77 | HR 68 | Temp 97.9°F | Resp 18 | Wt 169.8 lb

## 2017-11-05 DIAGNOSIS — I779 Disorder of arteries and arterioles, unspecified: Secondary | ICD-10-CM

## 2017-11-05 DIAGNOSIS — Z87891 Personal history of nicotine dependence: Secondary | ICD-10-CM | POA: Insufficient documentation

## 2017-11-05 NOTE — Progress Notes (Signed)
[facility]   CC: Follow up peripheral artery occlusive disease  History of Present Illness [name] is a 83 y.o. female patient that Dr. Oneida Alar has evaluated with c/o occasional pain in her left calf and foot, which has resolved.   She states that sometimes when she is sitting her left foot will turn purple. This resolves spontaneously. She states she does not walk much due to dizziness and falling.  She has a history of chronic back pain and has multiple cervical and lumbar spine disc disease with loss of height as well. She has no history of nonhealing wounds.  Other medical problems include mild depression and anxiety, COPD. These are stable.   She tripped and fell in February 2017, developed vertigo and right upper hip pain, after this.   She denies any hx of stroke or TIA.   Pt Diabetic: No Pt smoker: former smoker, started at age 78, quit several times, quit April 2017. She has gained weight since she quit smoking and indicates unhappiness re this.   Pt meds include: Statin :Yes Betablocker: No ASA: yes Other anticoagulants/antiplatelets: no   Past Medical History:  Diagnosis Date  . Anxiety   . Back pain   . Depression   . Emphysema   . Esophageal spasm   . Lung nodule   . OSA (obstructive sleep apnea)   . Osteomalacia   . Vertigo   . Vitamin D deficiency     Social History Social History   Tobacco Use  . Smoking status: Former Smoker    Packs/day: 1.00    Types: Cigarettes    Last attempt to quit: 03/2016    Years since quitting: 1.6  . Smokeless tobacco: Never Used  Substance Use Topics  . Alcohol use: No    Alcohol/week: 0.0 oz  . Drug use: No    Family History Family History  Problem Relation Age of Onset  . Diabetes type II Unknown   . Hypertension Unknown   . Breast cancer Maternal Aunt   . Other Mother        died of natural causes at age 51  . Other Father        stroke or heart attack while in  shower    Past Surgical History:  Procedure Laterality Date  . APPENDECTOMY    . BREAST EXCISIONAL BIOPSY Left 1978  . CARPAL TUNNEL RELEASE    . ROTATOR CUFF REPAIR      Allergies  Allergen Reactions  . Adhesive [Tape] Itching and Other (See Comments)    Little red itchy bumps develop  . Latex Itching and Other (See Comments)    Little red itchy bumps develop    Current Outpatient Medications  Medication Sig Dispense Refill  . acetaminophen (TYLENOL 8 HOUR) 650 MG CR tablet Take 650 mg by mouth every 8 (eight) hours as needed (pain).     Marland Kitchen albuterol (PROVENTIL HFA;VENTOLIN HFA) 108 (90 Base) MCG/ACT inhaler Inhale 2 puffs into the lungs every 6 (six) hours as needed for wheezing or shortness of breath.    Marland Kitchen aspirin EC 81 MG tablet Take 81 mg by mouth daily.    Marland Kitchen atorvastatin (LIPITOR) 10 MG tablet Take 10 mg by mouth at bedtime.    Marland Kitchen buPROPion (WELLBUTRIN XL) 150 MG 24 hr tablet Take 150 mg by mouth daily.    . Cholecalciferol (VITAMIN D) 2000 units tablet Take 4,000 Units by mouth daily.     Marland Kitchen  escitalopram (LEXAPRO) 20 MG tablet Take 40 mg by mouth daily.  2  . fluticasone (FLONASE) 50 MCG/ACT nasal spray Place 2 sprays into both nostrils daily.    . meclizine (ANTIVERT) 25 MG tablet Take 1 tablet (25 mg total) by mouth 3 (three) times daily as needed for dizziness. 20 tablet 0  . Multiple Vitamin (MULTIVITAMIN WITH MINERALS) TABS tablet Take 1 tablet by mouth daily.    Marland Kitchen tiotropium (SPIRIVA) 18 MCG inhalation capsule Place 18 mcg into inhaler and inhale daily.    . traMADol (ULTRAM) 50 MG tablet Take 50 mg by mouth daily.     No current facility-administered medications for this visit.     ROS: See HPI for pertinent positives and negatives.   Physical Examination  Vitals:   11/05/17 1542 11/05/17 1545  BP: (!) 154/71 (!) 153/77  Pulse: 68   Resp: 18   Temp: 97.9 F (36.6 C)   TempSrc: Oral   SpO2: 97%   Weight: 169 lb 12.8 oz (77 kg)    Body mass index is 32.08  kg/m.  General: A&O x 3, WDWN, obese female. Gait: normal Eyes: PERRLA. Pulmonary: Respirations are non labored, CTAB, good air movement Cardiac: regular rhythm, no detected murmur.         Carotid Bruits Right Left   Negative Negative   Abdominal aortic pulse is not palpable. Radial pulses: 2+ palpable and =                           VASCULAR EXAM: Extremities without ischemic changes, without Gangrene; without open wounds.                                                                                                                                                       LE Pulses Right Left       FEMORAL  2+ palpable  2+ palpable        POPLITEAL  not palpable   not palpable       POSTERIOR TIBIAL  not palpable   not palpable        DORSALIS PEDIS      ANTERIOR TIBIAL 2+ palpable  1+ palpable    Abdomen: soft, NT, no palpable masses. Skin: no rashes, no ulcers noted. Musculoskeletal: no muscle wasting or atrophy.         Neurologic: A&O X 3; appropriate affect ; SENSATION: normal; MOTOR FUNCTION:  moving all extremities equally, motor strength 4/5 throughout. Speech is fluent/normal. CN 2-12 intact     ASSESSMENT: [name] is a 83 y.o. female who presents with severe arthritis, multilevel spine disease with associated pain, and feet becoming discolored when sitting. Fortunately she does not have DM and she quit smoking in April 2017. She takes a daily  statin and 81 mg ASA.    Her walking is limited by her back pain issues.  Fortunately she quit smoking in April 2017 but does not like the weight gain since then.   At her 03/23/16 visit Dr. Oneida Alar indicates that he does not believe the discoloration in her left foot is related to arterial occlusive disease. This may be related to sympathetic discharge from her back pain. At that time she had adequate perfusion to the left leg and was not at risk of limb loss. Dr. Oneida Alar dicussed with her at the April  2017 visit that her risk of limb loss lifetime is less than 5% if she is able to quit smoking. She will try to walk for 30 minutes daily and quit smoking. If her symptoms become worse over time we could consider an arteriogram however symptoms are fairly mild currently. Her symptoms are also probably multifactorial related to her back pain as well as arterial occlusive disease.  DATA  ABI (Date: 11/05/2017):  R:   ABI: 1.01 (1.00 on 10-19-16),   PT: mono  DP: bi  TBI:  0.73  L:   ABI: 0.99 (was 0.84),   PT: bi  DP: tri  TBI: 0.76  Stable on the right, improved ABI on the left lower extremity. Normal bilateral ABI and TBI with tri, bi, and monophasic waveforms.   PLAN:  Daily seated leg exercises discussed and demonstrated. Also discussed pt looking into senior water aerobics which would afford her some exercise w/o back or joint pain, and might improve her back and joint pain.  Based on the patient's vascular studies and examination, pt will return to clinic in 1 year with ABI's. I advised her to notify us if she develops concerns re the circulation in her feet or legs.   I discussed in depth with the patient the nature of atherosclerosis, and emphasized the importance of maximal medical management including strict control of blood pressure, blood glucose, and lipid levels, obtaining regular exercise, and continued cessation of smoking.  The patient is aware that without maximal medical management the underlying atherosclerotic disease process will progress, limiting the benefit of any interventions.  The patient was given information about PAD including signs, symptoms, treatment, what symptoms should prompt the patient to seek immediate medical care, and risk reduction measures to take.  Clemon Chambers, RN, MSN, FNP-C [facility] Phone: [phone]  Clinic MD: Trula Slade  11/05/17 3:51 PM

## 2017-11-05 NOTE — Patient Instructions (Signed)

## 2017-11-06 ENCOUNTER — Encounter: Payer: Medicare Other | Admitting: Physical Therapy

## 2017-11-07 NOTE — Addendum Note (Signed)
Addended by: [name] Cure A on: 11/07/2017 10:42 AM   Modules accepted: Orders

## 2017-11-12 ENCOUNTER — Encounter: Payer: Medicare Other | Admitting: Rehabilitative and Restorative Service Providers"

## 2017-11-19 ENCOUNTER — Encounter: Payer: Medicare Other | Admitting: Rehabilitative and Restorative Service Providers"

## 2017-11-26 ENCOUNTER — Encounter: Payer: Medicare Other | Admitting: Rehabilitative and Restorative Service Providers"

## 2017-12-03 ENCOUNTER — Ambulatory Visit: Payer: Medicare Other | Admitting: Rehabilitative and Restorative Service Providers"

## 2017-12-07 ENCOUNTER — Telehealth: Payer: Self-pay | Admitting: *Deleted

## 2017-12-07 NOTE — Telephone Encounter (Signed)
Hello Dr Radford Pax this patient showed up for an appointment on 11/16 and nobody knew why she was here.  [place] went to the lobby to talk to her to see why she was here only to find out the patient did not have an appointment in our office. Reached out to the patient and was informed by the patient that she did not have an appointment with Dr Radford Pax so the patient was never seen in our office . In the appointment desk she was arrived at 1:54 pm but then remove arrival was recorded by Fransico Him at 2:00 pm.

## 2018-03-14 ENCOUNTER — Encounter: Payer: Self-pay | Admitting: Rehabilitative and Restorative Service Providers"

## 2018-03-14 NOTE — Therapy (Signed)
[facility] [address] [place], [place], [address] Phone: [phone]   Fax:  [phone]  Patient Details  Name: [name] MRN: [id number] Date of Birth: 1931-12-22 Referring Provider:  No ref. provider found  Encounter Date: last encounter 10/30/2017  PHYSICAL THERAPY DISCHARGE SUMMARY  Visits from Start of Care: 2  Current functional level related to goals / functional outcomes: See initial evaluation for patient status.   Remaining deficits: See initial evaluation from 10/11/17.   Education / Equipment: Home program.  Plan: Patient agrees to discharge.  Patient goals were not met. Patient is being discharged due to not returning since the last visit.  ?????      Thank you for the referral of this patient. Rudell Cobb, MPT   Lamark Schue 03/14/2018, 1:54 PM  [facility] [address] [place], [place], [address] Phone: [phone]   Fax:  [phone]

## 2018-04-08 ENCOUNTER — Emergency Department (HOSPITAL_COMMUNITY)
Admission: EM | Admit: 2018-04-08 | Discharge: 2018-04-08 | Disposition: A | Discharge status: Home / Self Care | Payer: Medicare Other | Attending: Emergency Medicine | Admitting: Emergency Medicine

## 2018-04-08 ENCOUNTER — Encounter (HOSPITAL_COMMUNITY): Payer: Self-pay | Admitting: Emergency Medicine

## 2018-04-08 ENCOUNTER — Emergency Department (HOSPITAL_COMMUNITY): Payer: Medicare Other

## 2018-04-08 DIAGNOSIS — M25551 Pain in right hip: Secondary | ICD-10-CM

## 2018-04-08 DIAGNOSIS — W1839XA Other fall on same level, initial encounter: Secondary | ICD-10-CM | POA: Diagnosis not present

## 2018-04-08 DIAGNOSIS — Y9389 Activity, other specified: Secondary | ICD-10-CM | POA: Diagnosis not present

## 2018-04-08 DIAGNOSIS — Y929 Unspecified place or not applicable: Secondary | ICD-10-CM | POA: Insufficient documentation

## 2018-04-08 DIAGNOSIS — S7001XA Contusion of right hip, initial encounter: Secondary | ICD-10-CM | POA: Insufficient documentation

## 2018-04-08 DIAGNOSIS — Z79899 Other long term (current) drug therapy: Secondary | ICD-10-CM | POA: Insufficient documentation

## 2018-04-08 DIAGNOSIS — Z87891 Personal history of nicotine dependence: Secondary | ICD-10-CM | POA: Insufficient documentation

## 2018-04-08 DIAGNOSIS — Z91040 Latex allergy status: Secondary | ICD-10-CM | POA: Diagnosis not present

## 2018-04-08 DIAGNOSIS — Y999 Unspecified external cause status: Secondary | ICD-10-CM | POA: Insufficient documentation

## 2018-04-08 DIAGNOSIS — S79811A Other specified injuries of right hip, initial encounter: Secondary | ICD-10-CM | POA: Diagnosis present

## 2018-04-08 DIAGNOSIS — Z7982 Long term (current) use of aspirin: Secondary | ICD-10-CM | POA: Diagnosis not present

## 2018-04-08 DIAGNOSIS — J449 Chronic obstructive pulmonary disease, unspecified: Secondary | ICD-10-CM | POA: Insufficient documentation

## 2018-04-08 DIAGNOSIS — W19XXXA Unspecified fall, initial encounter: Secondary | ICD-10-CM

## 2018-04-08 DIAGNOSIS — T148XXA Other injury of unspecified body region, initial encounter: Secondary | ICD-10-CM

## 2018-04-08 MED ORDER — ACETAMINOPHEN 500 MG PO TABS
1000.0000 mg | ORAL_TABLET | Freq: Once | ORAL | Status: AC
  Administered 2018-04-08: 1000 mg via ORAL
  Filled 2018-04-08: qty 2

## 2018-04-08 MED ORDER — IBUPROFEN 200 MG PO TABS
600.0000 mg | ORAL_TABLET | Freq: Once | ORAL | Status: AC
  Administered 2018-04-08: 600 mg via ORAL
  Filled 2018-04-08: qty 3

## 2018-04-08 MED ORDER — CYCLOBENZAPRINE HCL 10 MG PO TABS
5.0000 mg | ORAL_TABLET | Freq: Once | ORAL | Status: AC
  Administered 2018-04-08: 5 mg via ORAL
  Filled 2018-04-08: qty 1

## 2018-04-08 MED ORDER — CYCLOBENZAPRINE HCL 5 MG PO TABS: 5.0000 mg | ORAL_TABLET | Freq: Two times a day (BID) | ORAL | 0 refills | Status: DC | PRN

## 2018-04-08 NOTE — ED Provider Notes (Signed)
[facility] DEPT Provider Note   CSN: [id number] Arrival date & time: 04/08/18  1519     History   Chief Complaint Chief Complaint  Patient presents with  . Fall  . Hip Pain    HPI [name] is a 84 y.o. female.  HPI   Presents with concern for fall. Was reaching for something, has hx of vertigo, and then felt dizzy and fell.  Dizziness has been off and on for over a year and has been evaluated, it comes and goes. Not having it now.  Did hit head with fall today, no LOC. Did hit head but no headache, no blood thinners.  Did have nausea that has now resolved.  Right hip pain severe, severe bruising.  Severe pain in right hip with standing on it 10-15. No neck pain, no numbness, no weakness, no trouble talking.  Took tylenol at home which helped but it wore off in waiting room.     Past Medical History:  Diagnosis Date  . Anxiety   . Back pain   . Depression   . Emphysema   . Esophageal spasm   . Lung nodule   . OSA (obstructive sleep apnea)   . Osteomalacia   . Vertigo   . Vitamin D deficiency     Patient Active Problem List   Diagnosis Date Noted  . Vertigo 09/13/2017  . Abnormal finding on imaging 04/22/2012  . Dyslipidemia 04/22/2012  . Depression 04/22/2012  . Anxiety 04/22/2012  . Tobacco abuse 04/22/2012  . COPD (chronic obstructive pulmonary disease) ([place]) 04/22/2012  . Gait abnormality 04/22/2012    Past Surgical History:  Procedure Laterality Date  . APPENDECTOMY    . BREAST EXCISIONAL BIOPSY Left 1978  . CARPAL TUNNEL RELEASE    . ROTATOR CUFF REPAIR       OB History   None      Home Medications    Prior to Admission medications   Medication Sig Start Date End Date Taking? Authorizing Provider  acetaminophen (TYLENOL 8 HOUR) 650 MG CR tablet Take 650 mg by mouth 2 (two) times daily.    Yes [provider]  acetaminophen (TYLENOL) 500 MG tablet Take 500 mg by mouth every 6 (six) hours as needed  for moderate pain.   Yes [provider]  aspirin EC 81 MG tablet Take 81 mg by mouth daily.   Yes [provider]  atorvastatin (LIPITOR) 10 MG tablet Take 10 mg by mouth at bedtime.   Yes [provider]  buPROPion (WELLBUTRIN XL) 150 MG 24 hr tablet Take 150 mg by mouth daily.   Yes [provider]  Cholecalciferol (VITAMIN D) 2000 units tablet Take 2,000 Units by mouth daily.    Yes [provider]  escitalopram (LEXAPRO) 20 MG tablet Take 40 mg by mouth daily. 08/01/17  Yes [provider]  fluticasone (FLONASE) 50 MCG/ACT nasal spray Place 2 sprays into both nostrils daily.   Yes [provider]  Multiple Vitamin (MULTIVITAMIN WITH MINERALS) TABS tablet Take 1 tablet by mouth daily.   Yes [provider]  tiotropium (SPIRIVA) 18 MCG inhalation capsule Place 18 mcg into inhaler and inhale daily.   Yes [provider]  albuterol (PROVENTIL HFA;VENTOLIN HFA) 108 (90 Base) MCG/ACT inhaler Inhale 2 puffs into the lungs every 6 (six) hours as needed for wheezing or shortness of breath.    [provider]  cyclobenzaprine (FLEXERIL) 5 MG tablet Take 1 tablet (  5 mg total) by mouth 2 (two) times daily as needed for muscle spasms. 04/08/18   Gareth Morgan, MD  meclizine (ANTIVERT) 25 MG tablet Take 1 tablet (25 mg total) by mouth 3 (three) times daily as needed for dizziness. 09/15/17   Drenda Freeze, MD    Family History Family History  Problem Relation Age of Onset  . Diabetes type II Unknown   . Hypertension Unknown   . Breast cancer Maternal Aunt   . Other Mother        died of natural causes at age 35  . Other Father        stroke or heart attack while in shower    Social History Social History   Tobacco Use  . Smoking status: Former Smoker    Packs/day: 1.00    Types: Cigarettes    Last attempt to quit: 03/2016    Years since quitting: 2.0  . Smokeless tobacco: Never Used  Substance  Use Topics  . Alcohol use: No    Alcohol/week: 0.0 oz  . Drug use: No     Allergies   Adhesive [tape] and Latex   Review of Systems Review of Systems  Constitutional: Negative for fever.  HENT: Negative for sore throat.   Eyes: Negative for visual disturbance.  Respiratory: Negative for cough and shortness of breath.   Cardiovascular: Negative for chest pain.  Gastrointestinal: Positive for nausea. Negative for abdominal pain, diarrhea and vomiting.  Genitourinary: Negative for difficulty urinating.  Musculoskeletal: Positive for arthralgias. Negative for back pain and neck pain.  Skin: Negative for rash.  Neurological: Negative for syncope and headaches.     Physical Exam Updated Vital Signs BP (!) 152/60 (BP Location: Left Arm)   Pulse 65   Temp 98 F (36.7 C) (Oral)   Resp 20   SpO2 99%   Physical Exam  Constitutional: She is oriented to person, place, and time. She appears well-developed and well-nourished. No distress.  HENT:  Head: Normocephalic and atraumatic.  Eyes: Conjunctivae and EOM are normal.  Neck: Normal range of motion.  Cardiovascular: Normal rate, regular rhythm, normal heart sounds and intact distal pulses. Exam reveals no gallop and no friction rub.  No murmur heard. Pulmonary/Chest: Effort normal and breath sounds normal. No respiratory distress. She has no wheezes. She has no rales.  Abdominal: Soft. She exhibits no distension. There is no tenderness. There is no guarding.  Musculoskeletal: She exhibits no edema or tenderness.       Cervical back: She exhibits no tenderness and no bony tenderness.  Large 10cm hematoma right gluteal  Neurological: She is alert and oriented to person, place, and time.  Skin: Skin is warm and dry. No rash noted. She is not diaphoretic. No erythema.  Nursing note and vitals reviewed.    ED Treatments / Results  Labs (all labs ordered are listed, but only abnormal results are displayed) Labs Reviewed - No  data to display  EKG None  Radiology Dg Hip Unilat  With Pelvis 2-3 Views Right  Result Date: 04/08/2018 CLINICAL DATA:  Fall with right hip pain EXAM: DG HIP (WITH OR WITHOUT PELVIS) 2-3V RIGHT COMPARISON:  None. FINDINGS: Mild SI joint degenerative change. Mild arthritis of the right hip. No fracture or malalignment. Pubic symphysis is intact. IMPRESSION: 1. No acute osseous abnormality. 2. Mild arthritis of the right hip Electronically Signed   By: Donavan Foil M.D.   On: 04/08/2018 15:59    Procedures Procedures (including critical  care time)  Medications Ordered in ED Medications  acetaminophen (TYLENOL) tablet 1,000 mg (1,000 mg Oral Given 04/08/18 2127)  cyclobenzaprine (FLEXERIL) tablet 5 mg (5 mg Oral Given 04/08/18 2127)  ibuprofen (ADVIL,MOTRIN) tablet 600 mg (600 mg Oral Given 04/08/18 2127)     Initial Impression / Assessment and Plan / ED Course  I have reviewed the triage vital signs and the nursing notes.  Pertinent labs & imaging results that were available during my care of the patient were reviewed by me and considered in my medical decision making (see chart for details).    83yo female presents with concern for fall and right buttock/hip pain.  Reports hitting head but denies headache, vomiting, AMS, anticoagulation. Offered head CT but no signs of trauma on exam and patient would like to continue monitoring symptoms and return for worsening.  XR right hip shows no acute abnormalities.  Patient able to walk in ED, doubt occult fracture. Pain likely secondary to hematoma. Recommend ice, ibuprofen, tylenol and gave rx for flexeril. Patient discharged in stable condition with understanding of reasons to return.   Final Clinical Impressions(s) / ED Diagnoses   Final diagnoses:  Fall, initial encounter  Hematoma and contusion  Right hip pain    ED Discharge Orders        Ordered    cyclobenzaprine (FLEXERIL) 5 MG tablet  2 times daily PRN     04/08/18 2150        Gareth Morgan, MD 04/09/18 1036

## 2018-04-08 NOTE — ED Triage Notes (Signed)
Pt ws reaching up with gripper to get something and when when looked up and down she fell. Patient c/o pain on right hip.

## 2018-04-08 NOTE — ED Notes (Signed)
Pt's family member states "this is a stupid system" to this Probation officer.

## 2018-04-08 NOTE — ED Notes (Signed)
Patient ambulated to restroom and back to stretcher with minimal assistance using her walker. Also, provided patient some saltine crackers, peanut butter, and a diet coke.

## 2018-05-07 ENCOUNTER — Other Ambulatory Visit: Payer: Self-pay | Admitting: Family Medicine

## 2018-05-07 DIAGNOSIS — Z1231 Encounter for screening mammogram for malignant neoplasm of breast: Secondary | ICD-10-CM

## 2018-05-31 ENCOUNTER — Ambulatory Visit: Payer: Medicare Other

## 2018-06-12 ENCOUNTER — Ambulatory Visit
Admission: RE | Admit: 2018-06-12 | Discharge: 2018-06-12 | Disposition: A | Discharge status: Home / Self Care | Payer: Medicare Other | Source: Ambulatory Visit | Attending: Family Medicine | Admitting: Family Medicine

## 2018-06-12 DIAGNOSIS — Z1231 Encounter for screening mammogram for malignant neoplasm of breast: Secondary | ICD-10-CM

## 2018-06-13 ENCOUNTER — Other Ambulatory Visit: Payer: Self-pay | Admitting: Family Medicine

## 2018-06-13 DIAGNOSIS — R928 Other abnormal and inconclusive findings on diagnostic imaging of breast: Secondary | ICD-10-CM

## 2018-06-17 ENCOUNTER — Ambulatory Visit
Admission: RE | Admit: 2018-06-17 | Discharge: 2018-06-17 | Disposition: A | Discharge status: Home / Self Care | Payer: Medicare Other | Source: Ambulatory Visit | Attending: Family Medicine | Admitting: Family Medicine

## 2018-06-17 ENCOUNTER — Other Ambulatory Visit: Payer: Self-pay | Admitting: Family Medicine

## 2018-06-17 DIAGNOSIS — R928 Other abnormal and inconclusive findings on diagnostic imaging of breast: Secondary | ICD-10-CM

## 2018-06-17 DIAGNOSIS — N6489 Other specified disorders of breast: Secondary | ICD-10-CM

## 2018-10-25 ENCOUNTER — Other Ambulatory Visit: Payer: Self-pay

## 2018-10-25 DIAGNOSIS — I779 Disorder of arteries and arterioles, unspecified: Secondary | ICD-10-CM

## 2018-11-11 ENCOUNTER — Other Ambulatory Visit: Payer: Self-pay

## 2018-11-11 ENCOUNTER — Ambulatory Visit (HOSPITAL_COMMUNITY)
Admission: RE | Admit: 2018-11-11 | Discharge: 2018-11-11 | Disposition: A | Discharge status: Home / Self Care | Payer: Medicare Other | Source: Ambulatory Visit | Attending: Family | Admitting: Family

## 2018-11-11 ENCOUNTER — Ambulatory Visit: Payer: Medicare Other | Admitting: Family

## 2018-11-11 ENCOUNTER — Encounter: Payer: Self-pay | Admitting: Family

## 2018-11-11 VITALS — BP 127/68 | HR 59 | Temp 97.5°F | Resp 20 | Ht 61.0 in | Wt 149.0 lb

## 2018-11-11 DIAGNOSIS — I779 Disorder of arteries and arterioles, unspecified: Secondary | ICD-10-CM | POA: Insufficient documentation

## 2018-11-11 DIAGNOSIS — Z87891 Personal history of nicotine dependence: Secondary | ICD-10-CM | POA: Diagnosis not present

## 2018-11-11 NOTE — Progress Notes (Signed)
[facility] [place]   CC: Follow up peripheral artery occlusive disease  History of Present Illness [name] is a 84 y.o. female patient whom Dr. Oneida Taylor has evaluated with c/ooccasional pain in her left calf and foot, which has resolved.   She states that sometimes when she is sitting her left foot will turn purple. This resolves spontaneously. She states she does not walk much due to dizziness and falling.  She has a history of chronic back pain and has multiple cervical and lumbar spine disc disease with loss of height as well. She has no history of nonhealing wounds.  Other medical problems include mild depression and anxiety, COPD. These are stable.  She tripped and fell in February Taylor, developed vertigo and right upper hip pain, after this.   She denies any hx of stroke or TIA.   Diabetic:No Tobacco ZDG:LOVFIE smoker, started at age 65, quit several times, quitApril Taylor.   Pt meds include: Statin :Yes Betablocker:No ASA:yes Other anticoagulants/antiplatelets:no   Past Medical History:  Diagnosis Date  . Anxiety   . Back pain   . Depression   . Emphysema   . Esophageal spasm   . Lung nodule   . OSA (obstructive sleep apnea)   . Osteomalacia   . Vertigo   . Vitamin D deficiency     Social History Social History   Tobacco Use  . Smoking status: Former Smoker    Packs/day: 1.00    Types: Cigarettes    Last attempt to quit: 04/Taylor    Years since quitting: 2.6  . Smokeless tobacco: Never Used  Substance Use Topics  . Alcohol use: No    Alcohol/week: 0.0 standard drinks  . Drug use: No    Family History Family History  Problem Relation Age of Onset  . Diabetes type II Unknown   . Hypertension Unknown   . Breast cancer Maternal Aunt   . Other Mother        died of natural causes at age 5  . Other Father        stroke or heart attack while in shower    Past Surgical History:  Procedure Laterality Date  .  APPENDECTOMY    . BREAST EXCISIONAL BIOPSY Left 1978  . CARPAL TUNNEL RELEASE    . ROTATOR CUFF REPAIR      Allergies  Allergen Reactions  . Adhesive [Tape] Itching and Other (See Comments)    Little red itchy bumps develop  . Latex Itching and Other (See Comments)    Little red itchy bumps develop    Current Outpatient Medications  Medication Sig Dispense Refill  . acetaminophen (TYLENOL 8 HOUR) 650 MG CR tablet Take 650 mg by mouth 2 (two) times daily.     Marland Kitchen acetaminophen (TYLENOL) 500 MG tablet Take 500 mg by mouth every 6 (six) hours as needed for moderate pain.    Marland Kitchen albuterol (PROVENTIL HFA;VENTOLIN HFA) 108 (90 Base) MCG/ACT inhaler Inhale 2 puffs into the lungs every 6 (six) hours as needed for wheezing or shortness of breath.    Marland Kitchen aspirin EC 81 MG tablet Take 81 mg by mouth daily.    Marland Kitchen atorvastatin (LIPITOR) 10 MG tablet Take 10 mg by mouth at bedtime.    Marland Kitchen buPROPion (WELLBUTRIN XL) 150 MG 24 hr tablet Take 150 mg by mouth daily.    . Cholecalciferol (VITAMIN D) 2000 units tablet Take 2,000 Units by mouth daily.     . cyclobenzaprine (  FLEXERIL) 5 MG tablet Take 1 tablet (5 mg total) by mouth 2 (two) times daily as needed for muscle spasms. 16 tablet 0  . escitalopram (LEXAPRO) 20 MG tablet Take 40 mg by mouth daily.  2  . fluticasone (FLONASE) 50 MCG/ACT nasal spray Place 2 sprays into both nostrils daily.    . meclizine (ANTIVERT) 25 MG tablet Take 1 tablet (25 mg total) by mouth 3 (three) times daily as needed for dizziness. 20 tablet 0  . Multiple Vitamin (MULTIVITAMIN WITH MINERALS) TABS tablet Take 1 tablet by mouth daily.    Marland Kitchen tiotropium (SPIRIVA) 18 MCG inhalation capsule Place 18 mcg into inhaler and inhale daily.     No current facility-administered medications for this visit.     ROS: See HPI for pertinent positives and negatives.   Physical Examination  Vitals:   11/11/18 1433 11/11/18 1436  BP: 134/69 127/68  Pulse: (!) 59   Resp: 20   Temp: (!) 97.5 F  (36.4 C)   TempSrc: Oral   SpO2: 92%   Weight: 149 lb (67.6 kg)   Height: 5\' 1"  (1.549 m)    Body mass index is 28.15 kg/m.  General: A&O x 3, WDWN female. Gait:normal Eyes: PERRLA. Pulmonary: Respirations are non labored, CTAB, good air movement Cardiac:regularrhythm, no detected murmur.    Carotid Bruits Right Left   Negative Negative   Abdominal aortic pulse isnotpalpable. Radial pulses:2+ palpable and =  VASCULAR EXAM: Extremitieswithoutischemic changes, withoutGangrene; withoutopen wounds.  LE Pulses Right Left  FEMORAL 2+palpable 2+palpable   POPLITEAL notpalpable  notpalpable  POSTERIOR TIBIAL notpalpable  notpalpable   DORSALIS PEDIS ANTERIOR TIBIAL 1+palpable  1+palpable    Abdomen: soft, NT, nopalpablemasses. Musculoskeletal: no muscle wasting or atrophy. Neurologic: A&O X 3; appropriate affect ; SENSATION: normal; MOTOR FUNCTION: moving all extremities equally, motor strength 4/5 throughout. Speech is fluent/normal. CN 2-12 intact Skin: no rashes, no cellulitis, no ulcers noted. Psychiatric: Thought content is normal, mood appropriate for clinical situation.     ASSESSMENT: [name] is a 84 y.o. female who presents with severe arthritis, multilevel spine disease with associated pain, and feet becoming discolored when sitting. Fortunately she does not have DM and she quit smoking in [name]. She takes a daily statin and 81 mg ASA.    Her walking is limited by her back pain issues.  Fortunately she quit smoking in [name].  At her 03/23/16 visit Dr. Oneida Taylor indicates that he doesnot believe the discoloration in her left foot is related to arterial occlusive disease. This may be related to sympathetic discharge from her back pain. At that time she had adequate perfusion to the left leg and was not at risk of limb loss.Dr. Oneida Taylor  dicussed with her at the [name] visitthat her risk of limb loss lifetime is less than 5% if she is able to quit smoking. She will try to walk for 30 minutes daily and quit smoking. If her symptoms become worse over time we could consider an arteriogram however symptoms are fairly mild currently. Her symptoms are also probably multifactorial related to her back pain as well as arterial occlusive disease.  DATA  ABI (Date: 11/11/2018):  R:   ABI: 0.82 (was 1.01 on 11-05-17),   PT: absent  DP: tri  TBI:  0.58, toe pressure 89, (0.73)  L:   ABI: 0.80 (2as 0.99),   PT: tri  DP: tri  TBI: 0.63, toe pressure 96, (was 0.76) Decline in bilateral ABI and  TBI, mild disease bilaterally, triphasic waveforms bilaterally except right PT is absent.     PLAN:  Daily seated leg exercises discussed and demonstrated.    Also discussed pt looking into senior water aerobics which would afford her some exercise w/o back or joint pain, and might improve her back and joint pain.  Based on the patient's vascular studies and examination, pt will return to clinic in1year with ABI's. I advised her to notify us if she develops concerns re the circulation in her feet or legs.   I discussed in depth with the patient the nature of atherosclerosis, and emphasized the importance of maximal medical management including strict control of blood pressure, blood glucose, and lipid levels, obtaining regular exercise, and continued cessation of smoking.  The patient is aware that without maximal medical management the underlying atherosclerotic disease process will progress, limiting the benefit of any interventions.  The patient was given information about PAD including signs, symptoms, treatment, what symptoms should prompt the patient to seek immediate medical care, and risk reduction measures to take.  Clemon Chambers, RN, MSN, FNP-C [facility] Phone:  [phone]  Clinic MD: Trula Slade  11/11/18 2:55 PM

## 2018-11-11 NOTE — Patient Instructions (Signed)

## 2018-12-19 ENCOUNTER — Ambulatory Visit: Payer: Medicare Other

## 2018-12-19 ENCOUNTER — Ambulatory Visit
Admission: RE | Admit: 2018-12-19 | Discharge: 2018-12-19 | Disposition: A | Discharge status: Home / Self Care | Payer: Medicare Other | Source: Ambulatory Visit | Attending: Family Medicine | Admitting: Family Medicine

## 2018-12-19 DIAGNOSIS — N6489 Other specified disorders of breast: Secondary | ICD-10-CM

## 2019-03-12 IMAGING — CT CT HEAD W/O CM
4 series · 16 of 47 positions shown, 18 images · non-contrast
Comparison: CT 06/19/2017,

CLINICAL DATA: 85-year-old female with a history of cough weakness
and dizziness

EXAM:
CT HEAD WITHOUT CONTRAST
TECHNIQUE: Contiguous axial images were obtained from the base of the skull
through the vertex without intravenous contrast.

[Series 3: head without · axial · non-contrast · 0.44mm/px · z∈[-166,-31]mm · 7 of 37 slices shown, 9 images]
[im 5/37  brain]
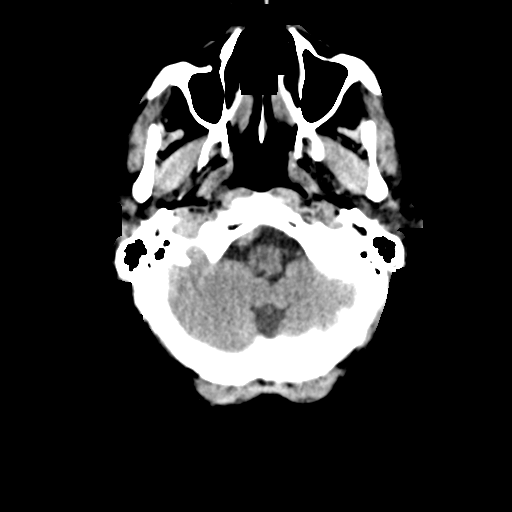
[im 5/37  bone]
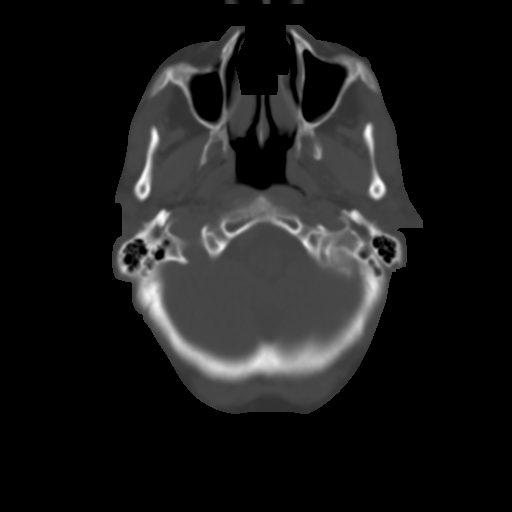
[im 10/37  brain]
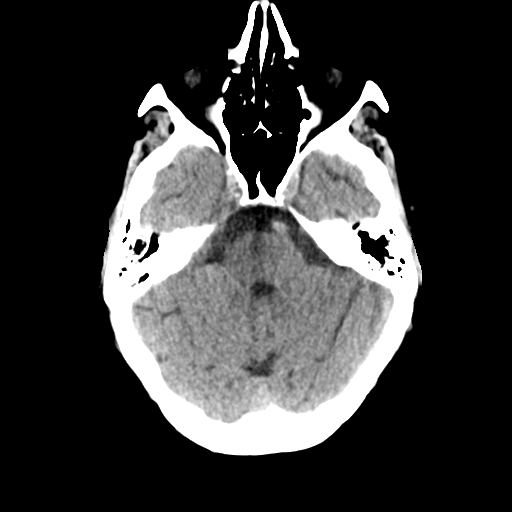
[im 14/37  brain]
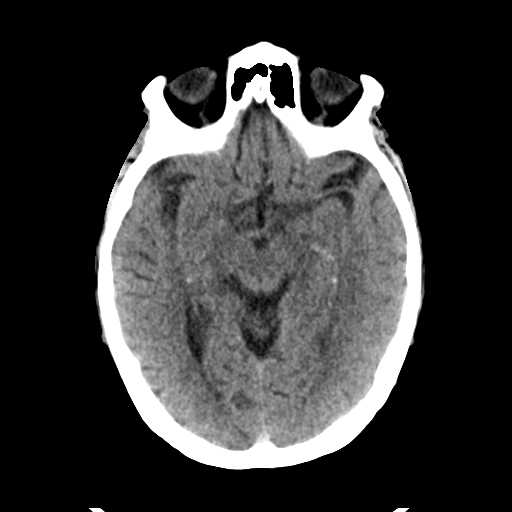
[im 19/37  brain]
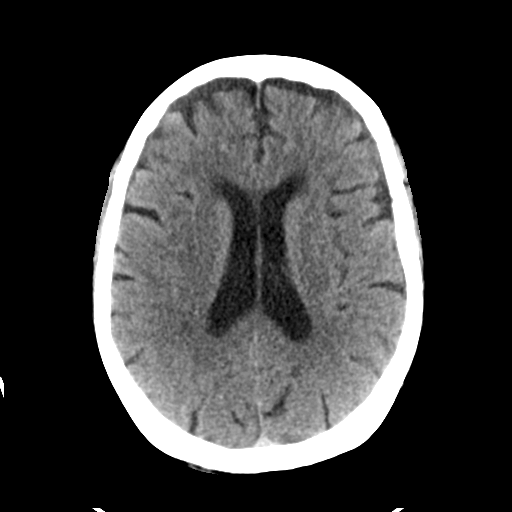
[im 23/37  brain]
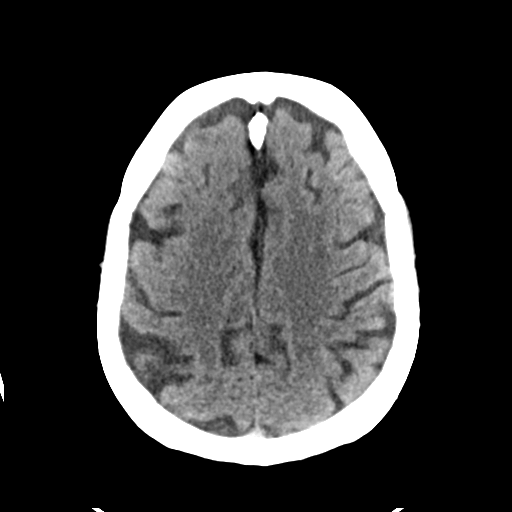
[im 23/37  bone]
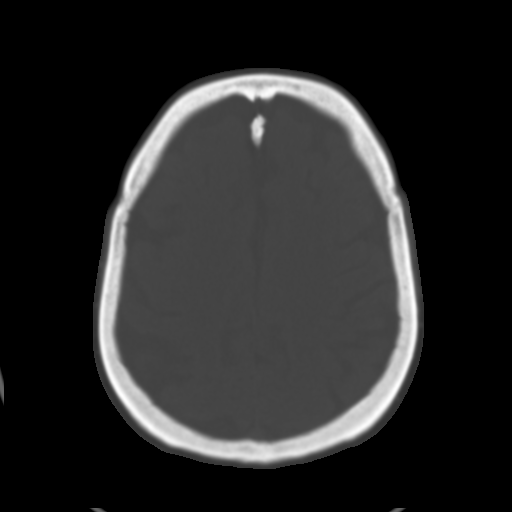
[im 28/37  brain]
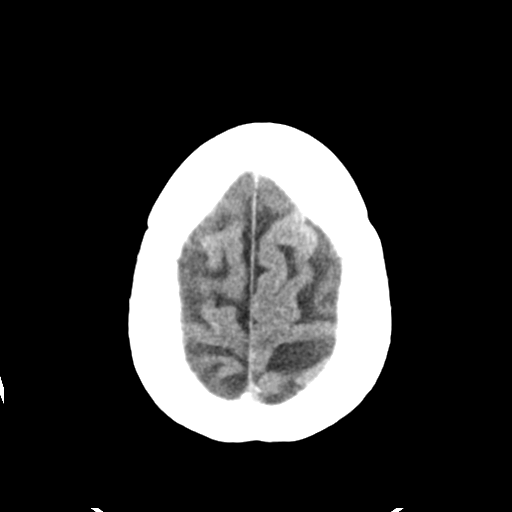
[im 32/37  brain]
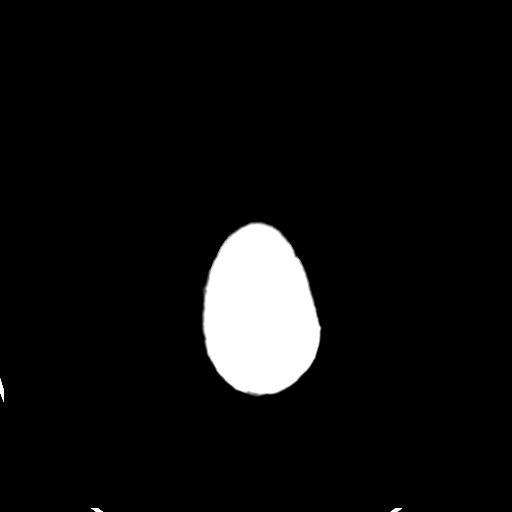

[Series 4: head bone · axial · 0.44mm/px · z∈[-168,-132]mm · 3 of 91 slices shown]
[im 10/91  bone]
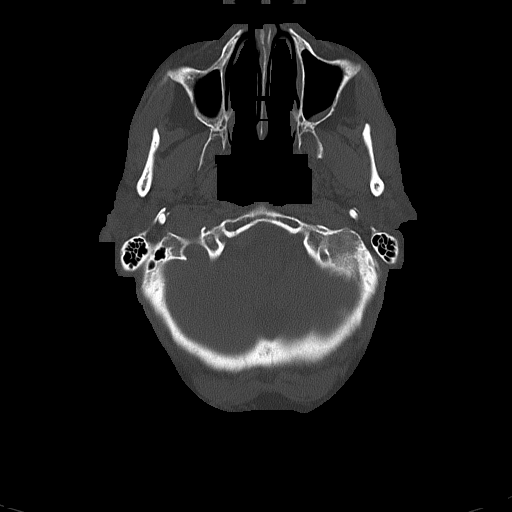
[im 19/91  bone]
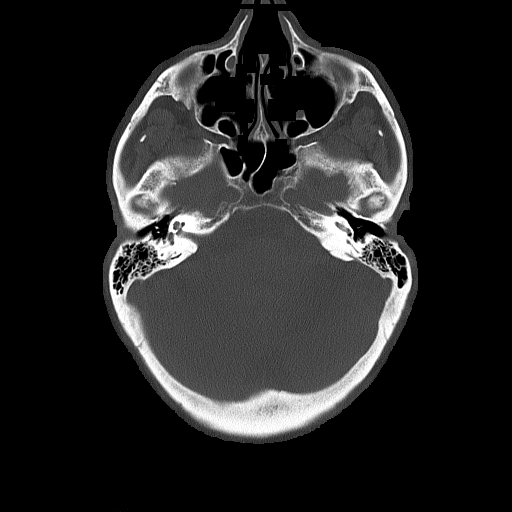
[im 28/91  bone]
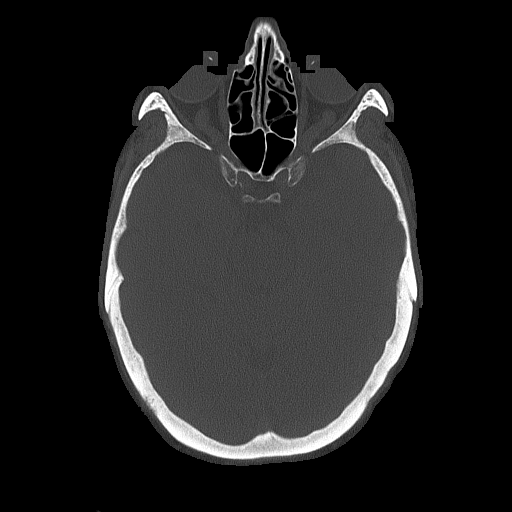

[Series 5: head without cor · coronal · non-contrast · 0.35mm/px · 3 of 78 slices shown]
[im 26/78  brain]
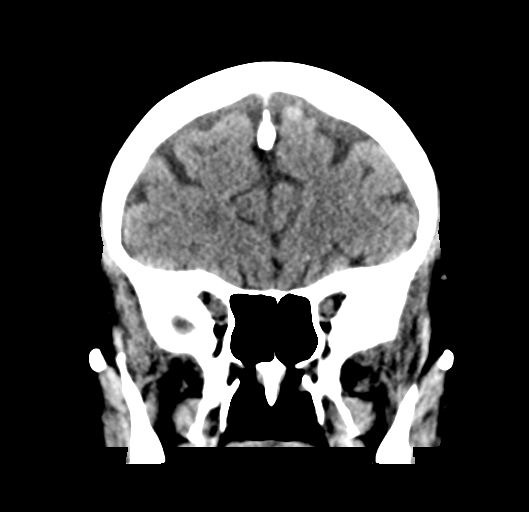
[im 35/78  brain]
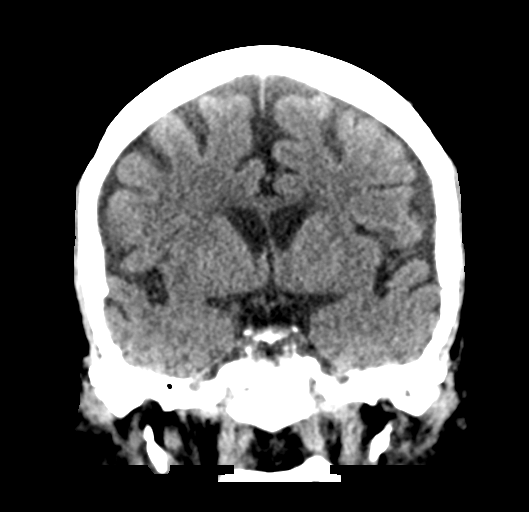
[im 43/78  brain]
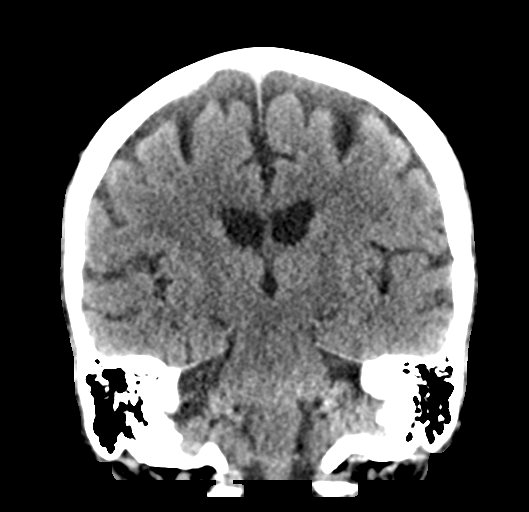

[Series 6: head without sag · sagittal · non-contrast · 0.34mm/px · 3 of 57 slices shown]
[im 19/57  brain]
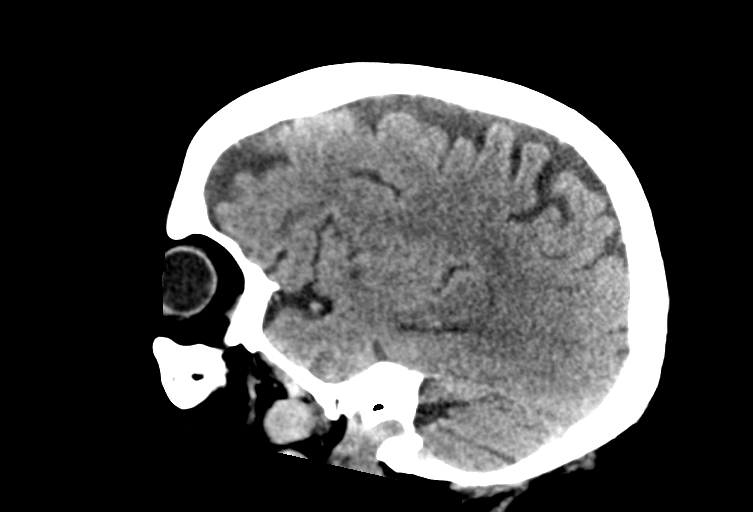
[im 29/57  brain]
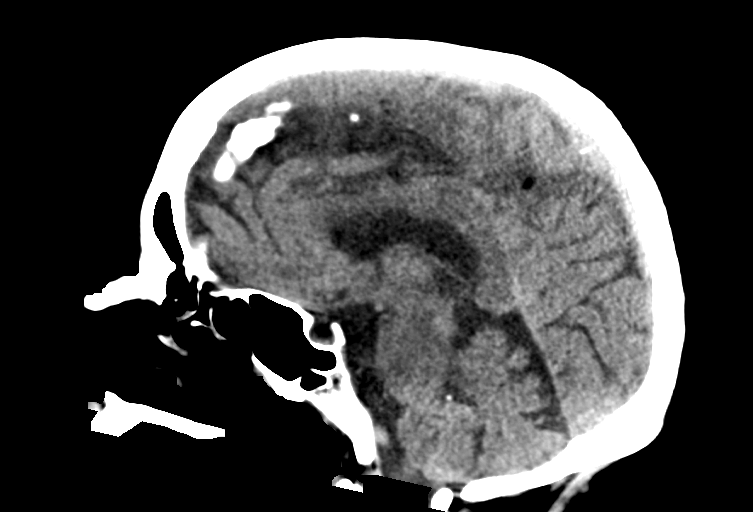
[im 38/57  brain]
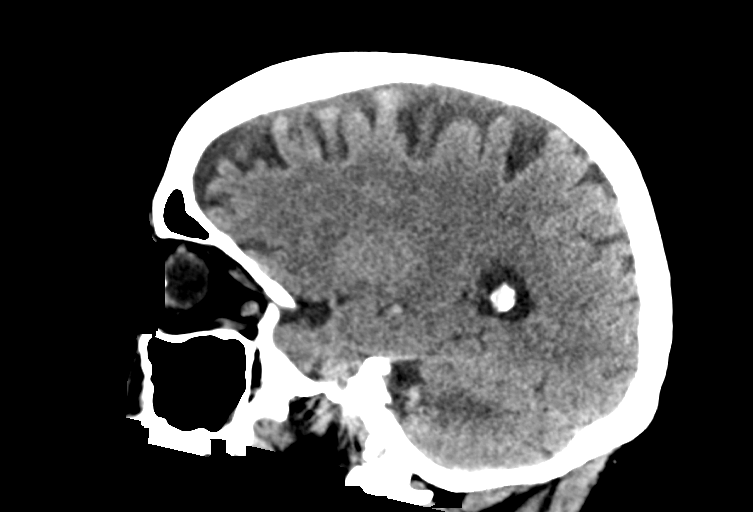

[16 of 47 positions shown; findings below may reference images not displayed]

FINDINGS: Brain: No acute intracranial hemorrhage. No midline shift or mass
effect. Unremarkable configuration of the ventricles.

Gray-white differentiation is relatively maintained.

Vascular: Calcifications of the anterior circulation.

Skull: No fracture. No aggressive bony lesions. Unremarkable
appearance of the scalp soft tissues.

Sinuses/Orbits: Unremarkable globes.  No significant sinus disease.

Other: None
IMPRESSION: No CT evidence of acute intracranial abnormality.

## 2019-03-12 IMAGING — MR MR HEAD W/O CM
9 of 10 series · 35 of 48 positions shown · non-contrast
Comparison: Prior CT from earlier the same day.

CLINICAL DATA: Initial evaluation for worsening dizziness.

EXAM:
MRI HEAD WITHOUT CONTRAST
TECHNIQUE: Multiplanar, multiecho pulse sequences of the brain and surrounding
structures were obtained without intravenous contrast.

[Series 5: DWI · coronal · 5.0mm · 1.09mm/px · 7 of 76 slices shown (1 of 4)]
[im 1/76]
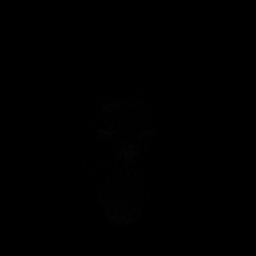
[im 13/76]
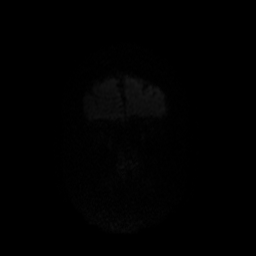
[im 26/76]
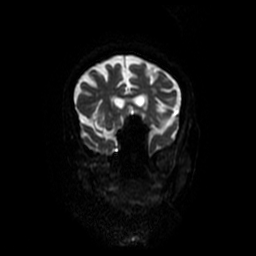
[im 38/76]
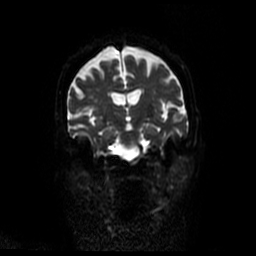
[im 51/76]
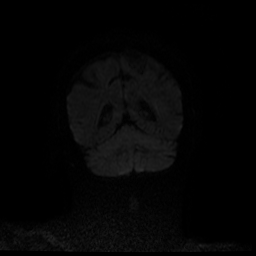
[im 63/76]
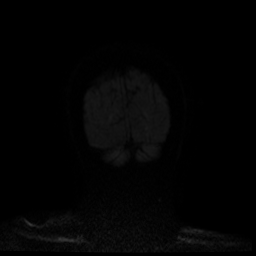
[im 76/76]
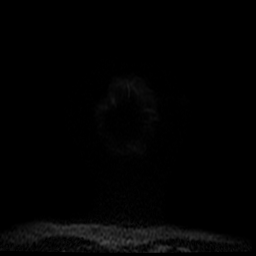

[Series 6: FLAIR · sagittal · 5.0mm · 0.47mm/px · 2 of 23 slices shown (1 of 2)]
[im 1/23]
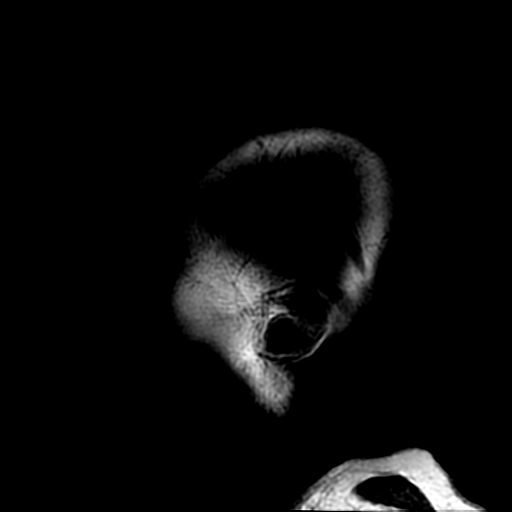
[im 23/23]
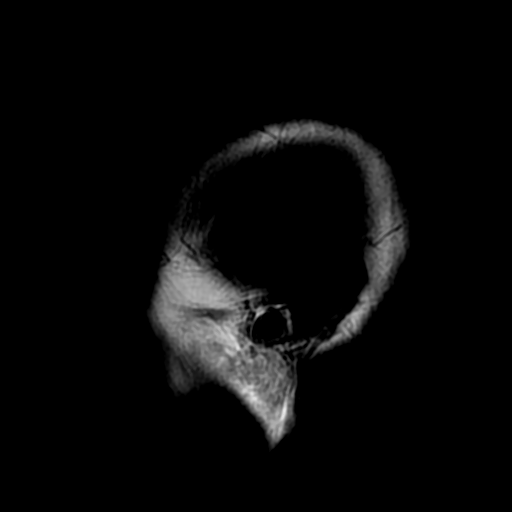

[Series 7: DWI · axial · 3.0mm · 1.09mm/px · z∈[-98,+45]mm · 8 of 98 slices shown (2 of 4)]
[im 1/98]
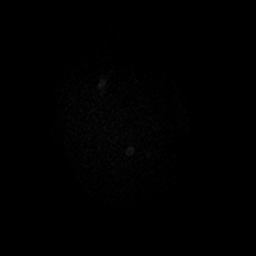
[im 11/98]
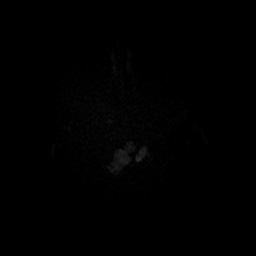
[im 33/98]
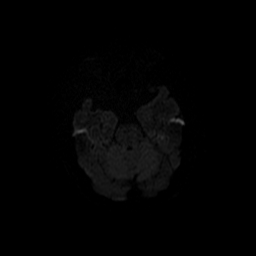
[im 44/98]
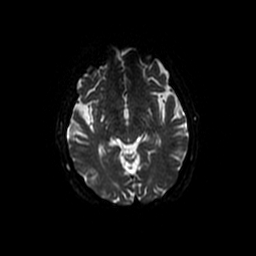
[im 54/98]
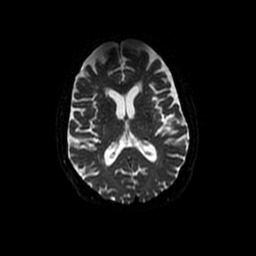
[im 65/98]
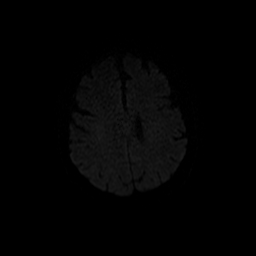
[im 87/98]
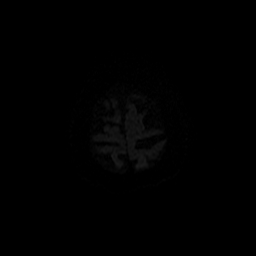
[im 98/98]
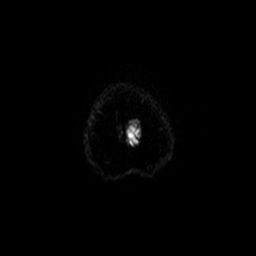

[Series 8: T2 · axial · 5.0mm · 0.43mm/px · z∈[-109,+39]mm · 3 of 26 slices shown (1 of 2)]
[im 1/26]
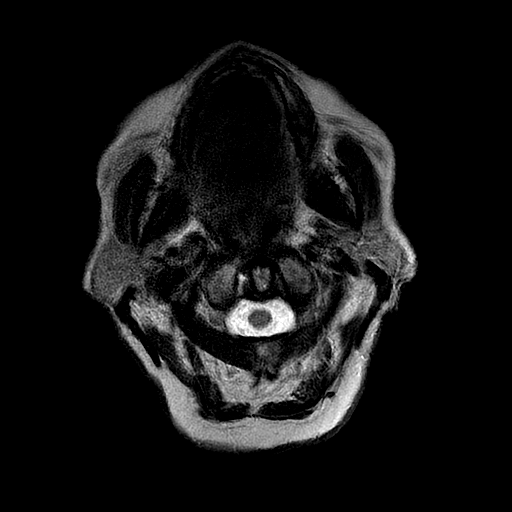
[im 13/26]
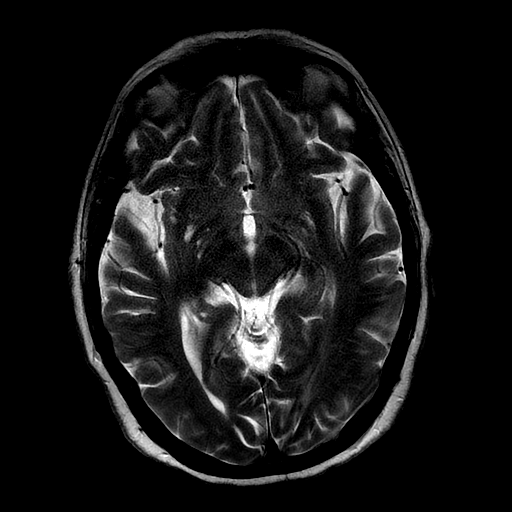
[im 26/26]
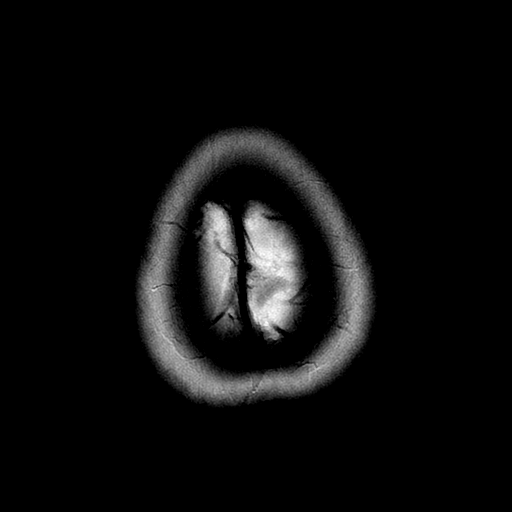

[Series 9: FLAIR · axial · 5.0mm · 0.43mm/px · z∈[-109,+39]mm · 3 of 26 slices shown (2 of 2)]
[im 1/26]
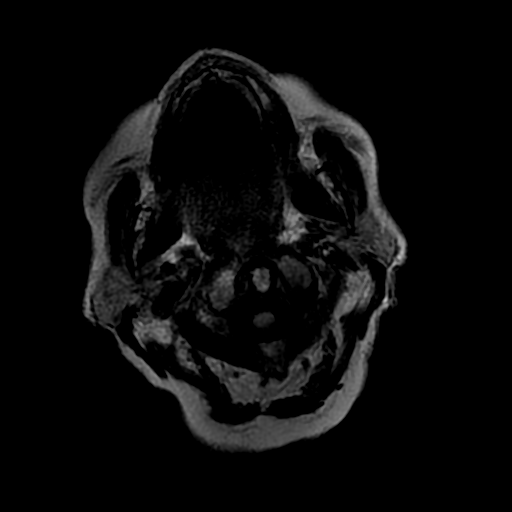
[im 13/26]
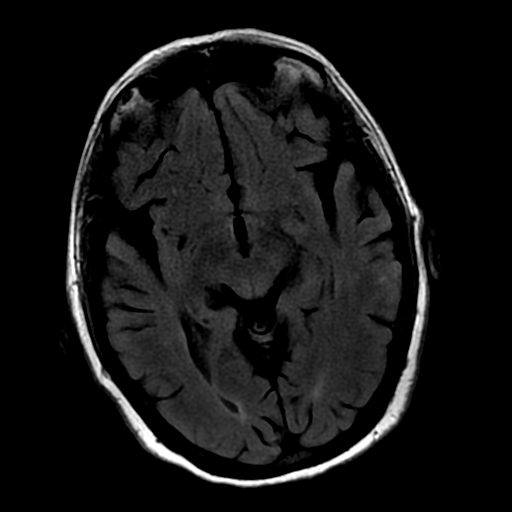
[im 26/26]
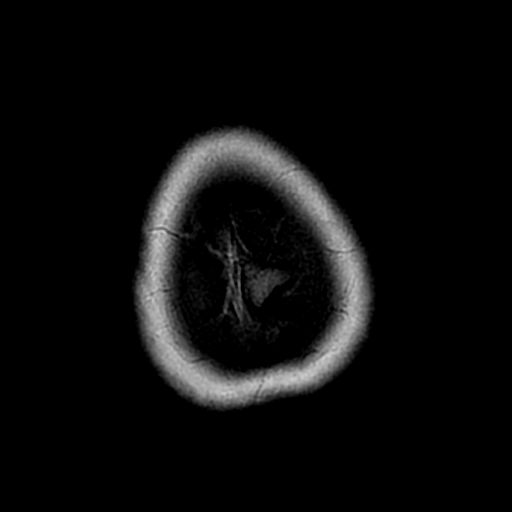

[Series 10: ax mpgr · axial · 5.0mm · 0.45mm/px · 1 of 22 slices shown]
[im 1/22]
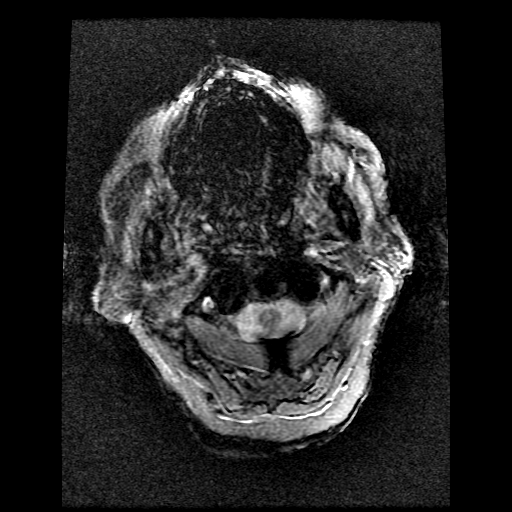

[Series 12: T2 · coronal · 5.0mm · 0.43mm/px · 2 of 24 slices shown (2 of 2)]
[im 1/24]
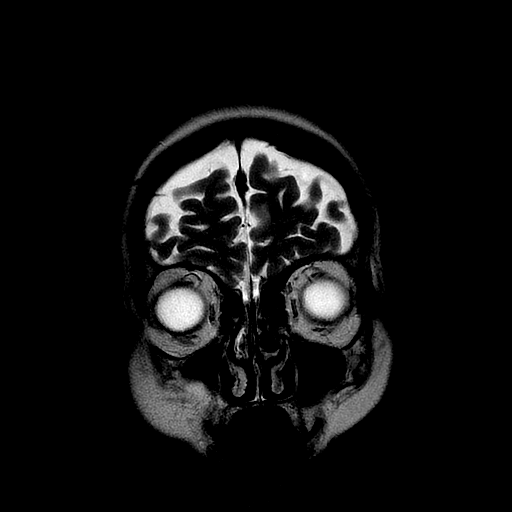
[im 24/24]
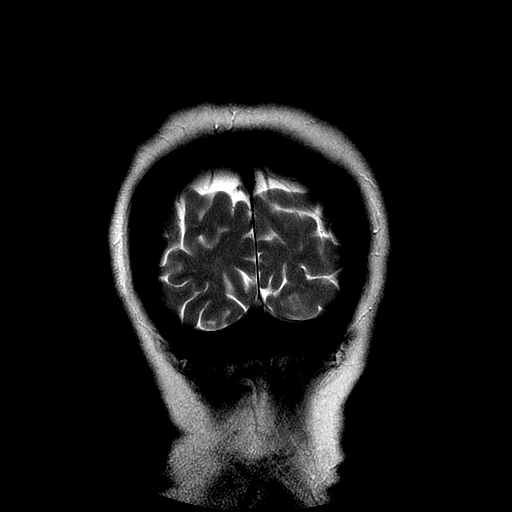

[Series 500: DWI · coronal · 5.0mm · 1.09mm/px · 4 of 38 slices shown (3 of 4)]
[im 1/38]
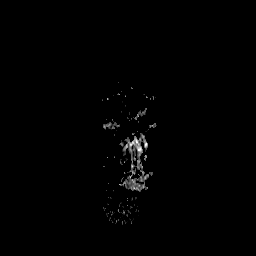
[im 13/38]
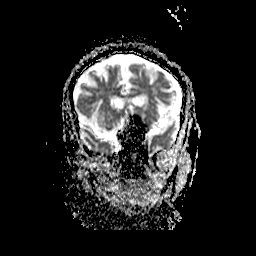
[im 25/38]
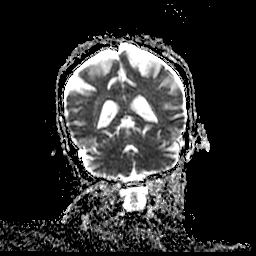
[im 38/38]
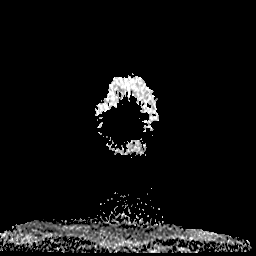

[Series 700: DWI · axial · 3.0mm · 1.09mm/px · z∈[-98,+45]mm · 5 of 49 slices shown (4 of 4)]
[im 1/49]
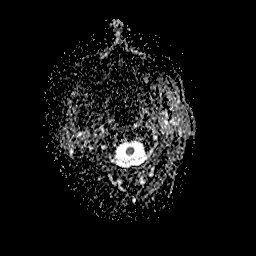
[im 13/49]
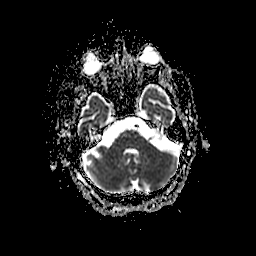
[im 25/49]
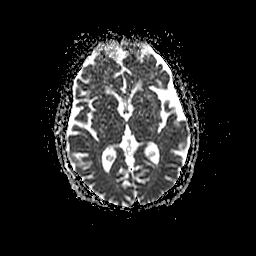
[im 37/49]
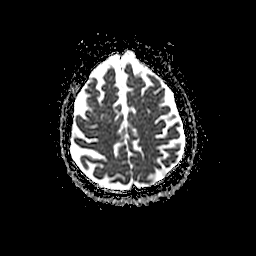
[im 49/49]
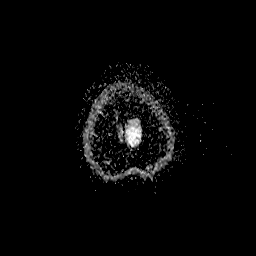

[35 of 48 positions shown; findings below may reference images not displayed]

FINDINGS: Brain: Diffuse prominence of the CSF containing spaces compatible
with generalized age-related cerebral atrophy. Patchy T2/FLAIR
hyperintensity within the periventricular and deep white matter both
cerebral hemispheres most consistent with chronic small vessel
ischemic disease. No abnormal foci of restricted diffusion to
suggest acute or subacute ischemia. Gray-white matter
differentiation maintained. No areas of chronic infarction. No
evidence for acute or chronic intracranial hemorrhage.

No mass lesion, midline shift or mass effect. Ventricles normal in
size without evidence for hydrocephalus. No extra-axial fluid
collection. Major dural sinuses grossly patent.

Incidental note made of a partially empty sella. Midline structures
intact and normal.

Vascular: Major intracranial vascular flow voids maintained.

Skull and upper cervical spine: Craniocervical junction normal.
Degenerative spondylolysis noted at C3-4 with resultant mild spinal
stenosis. Remainder the visualized upper cervical spine otherwise
unremarkable. Bone marrow signal intensity within normal limits. No
scalp soft tissue abnormality.

Sinuses/Orbits: Globes and orbital soft tissues within normal
limits. Patient status post lens extraction bilaterally. Paranasal
sinuses largely largely clear. No mastoid effusion. Inner ear
structures normal.

Other: None.
IMPRESSION: 1. No acute intracranial abnormality identified.
2. Mild age-related cerebral atrophy with chronic small vessel
ischemic disease.

## 2019-07-14 ENCOUNTER — Other Ambulatory Visit: Payer: Self-pay | Admitting: Family Medicine

## 2019-07-16 ENCOUNTER — Other Ambulatory Visit: Payer: Self-pay | Admitting: Family Medicine

## 2019-07-16 DIAGNOSIS — N6489 Other specified disorders of breast: Secondary | ICD-10-CM

## 2019-07-31 ENCOUNTER — Ambulatory Visit
Admission: RE | Admit: 2019-07-31 | Discharge: 2019-07-31 | Disposition: A | Discharge status: Home / Self Care | Payer: Medicare Other | Source: Ambulatory Visit | Attending: Family Medicine | Admitting: Family Medicine

## 2019-07-31 ENCOUNTER — Other Ambulatory Visit: Payer: Self-pay

## 2019-07-31 DIAGNOSIS — N6489 Other specified disorders of breast: Secondary | ICD-10-CM

## 2019-11-18 ENCOUNTER — Emergency Department (HOSPITAL_COMMUNITY): Payer: Medicare Other

## 2019-11-18 ENCOUNTER — Encounter (HOSPITAL_COMMUNITY): Payer: Self-pay

## 2019-11-18 ENCOUNTER — Other Ambulatory Visit: Payer: Self-pay

## 2019-11-18 ENCOUNTER — Emergency Department (HOSPITAL_COMMUNITY)
Admission: EM | Admit: 2019-11-18 | Discharge: 2019-11-18 | Disposition: A | Discharge status: Home / Self Care | Payer: Medicare Other | Attending: Emergency Medicine | Admitting: Emergency Medicine

## 2019-11-18 DIAGNOSIS — Z87891 Personal history of nicotine dependence: Secondary | ICD-10-CM | POA: Insufficient documentation

## 2019-11-18 DIAGNOSIS — S99921A Unspecified injury of right foot, initial encounter: Secondary | ICD-10-CM | POA: Diagnosis present

## 2019-11-18 DIAGNOSIS — Y929 Unspecified place or not applicable: Secondary | ICD-10-CM | POA: Insufficient documentation

## 2019-11-18 DIAGNOSIS — Z91040 Latex allergy status: Secondary | ICD-10-CM | POA: Insufficient documentation

## 2019-11-18 DIAGNOSIS — Y999 Unspecified external cause status: Secondary | ICD-10-CM | POA: Insufficient documentation

## 2019-11-18 DIAGNOSIS — Y9389 Activity, other specified: Secondary | ICD-10-CM | POA: Diagnosis not present

## 2019-11-18 DIAGNOSIS — J449 Chronic obstructive pulmonary disease, unspecified: Secondary | ICD-10-CM | POA: Diagnosis not present

## 2019-11-18 DIAGNOSIS — Z79899 Other long term (current) drug therapy: Secondary | ICD-10-CM | POA: Insufficient documentation

## 2019-11-18 DIAGNOSIS — Z7982 Long term (current) use of aspirin: Secondary | ICD-10-CM | POA: Diagnosis not present

## 2019-11-18 DIAGNOSIS — S92421A Displaced fracture of distal phalanx of right great toe, initial encounter for closed fracture: Secondary | ICD-10-CM | POA: Insufficient documentation

## 2019-11-18 DIAGNOSIS — W208XXA Other cause of strike by thrown, projected or falling object, initial encounter: Secondary | ICD-10-CM | POA: Diagnosis not present

## 2019-11-18 MED ORDER — HYDROCODONE-ACETAMINOPHEN 5-325 MG PO TABS: 1.0000 | ORAL_TABLET | Freq: Four times a day (QID) | ORAL | 0 refills | Status: DC | PRN

## 2019-11-18 NOTE — ED Provider Notes (Signed)
[facility] DEPT Provider Note   CSN: [id number] Arrival date & time: 11/18/19  1810     History   Chief Complaint Chief Complaint  Patient presents with  . Toe Pain    right foot big toe  . Fall    HPI [name] is a 85 y.o. female with past medical history as listed below presents to emergency room today with chief complaint of right great toe pain.  Onset was acute running 1 hour prior to arrival.  Patient states she was lifting a vase and did not realize a large piece of marble shelf was stuck to the bottom of it.  Unfortunately the marble dropped and landed on her right great toe.  She rates pain 5 out of 10 in severity.  She states it is a dull ache.  Pain does not radiate.  She has been able to walk and bear weight on right lower extremity.  She denies falling, hitting her head or loss of consciousness.  Patient denies any numbness, weakness, tingling.  Patient is not anticoagulated.  She did not take anything for pain prior to arrival.  History provided by patient with additional history obtained from chart review.     Past Medical History:  Diagnosis Date  . Anxiety   . Back pain   . Depression   . Emphysema   . Esophageal spasm   . Lung nodule   . OSA (obstructive sleep apnea)   . Osteomalacia   . Vertigo   . Vitamin D deficiency     Patient Active Problem List   Diagnosis Date Noted  . Vertigo 09/13/2017  . Abnormal finding on imaging 04/22/2012  . Dyslipidemia 04/22/2012  . Depression 04/22/2012  . Anxiety 04/22/2012  . Tobacco abuse 04/22/2012  . COPD (chronic obstructive pulmonary disease) ([place]) 04/22/2012  . Gait abnormality 04/22/2012    Past Surgical History:  Procedure Laterality Date  . APPENDECTOMY    . BREAST EXCISIONAL BIOPSY Left 1978  . CARPAL TUNNEL RELEASE    . ROTATOR CUFF REPAIR       OB History   No obstetric history on file.      Home Medications    Prior to Admission medications    Medication Sig Start Date End Date Taking? Authorizing Provider  acetaminophen (TYLENOL 8 HOUR) 650 MG CR tablet Take 650 mg by mouth 2 (two) times daily.     [provider]  acetaminophen (TYLENOL) 500 MG tablet Take 500 mg by mouth every 6 (six) hours as needed for moderate pain.    [provider]  albuterol (PROVENTIL HFA;VENTOLIN HFA) 108 (90 Base) MCG/ACT inhaler Inhale 2 puffs into the lungs every 6 (six) hours as needed for wheezing or shortness of breath.    [provider]  aspirin EC 81 MG tablet Take 81 mg by mouth daily.    [provider]  atorvastatin (LIPITOR) 10 MG tablet Take 10 mg by mouth at bedtime.    [provider]  buPROPion (WELLBUTRIN XL) 150 MG 24 hr tablet Take 150 mg by mouth daily.    [provider]  Cholecalciferol (VITAMIN D) 2000 units tablet Take 2,000 Units by mouth daily.     [provider]  cyclobenzaprine (FLEXERIL) 5 MG tablet Take 1 tablet (5 mg total) by mouth 2 (two) times daily as needed for muscle spasms. 04/08/18   Gareth Morgan, MD  escitalopram (LEXAPRO) 20 MG tablet Take 40 mg by mouth daily.  08/01/17   [provider]  fluticasone (FLONASE) 50 MCG/ACT nasal spray Place 2 sprays into both nostrils daily.    [provider]  HYDROcodone-acetaminophen (NORCO/VICODIN) 5-325 MG tablet Take 1 tablet by mouth every 6 (six) hours as needed for severe pain. 11/18/19   Albrizze, Kaitlyn E, PA-C  meclizine (ANTIVERT) 25 MG tablet Take 1 tablet (25 mg total) by mouth 3 (three) times daily as needed for dizziness. 09/15/17   Drenda Freeze, MD  Multiple Vitamin (MULTIVITAMIN WITH MINERALS) TABS tablet Take 1 tablet by mouth daily.    [provider]  tiotropium (SPIRIVA) 18 MCG inhalation capsule Place 18 mcg into inhaler and inhale daily.    [provider]    Family History Family History  Problem Relation Age of Onset  . Diabetes type II Other   .  Hypertension Other   . Breast cancer Maternal Aunt   . Other Mother        died of natural causes at age 60  . Other Father        stroke or heart attack while in shower    Social History Social History   Tobacco Use  . Smoking status: Former Smoker    Packs/day: 1.00    Types: Cigarettes    Quit date: 03/2016    Years since quitting: 3.6  . Smokeless tobacco: Never Used  Substance Use Topics  . Alcohol use: No    Alcohol/week: 0.0 standard drinks  . Drug use: No     Allergies   Adhesive [tape] and Latex   Review of Systems Review of Systems All other systems are reviewed and are negative for acute change except as noted in the HPI.   Physical Exam Updated Vital Signs BP (!) 148/77 (BP Location: Left Arm)   Pulse 74   Temp 98.7 F (37.1 C) (Oral)   Resp 17   SpO2 96%   Physical Exam Vitals signs and nursing note reviewed.  Constitutional:      Appearance: She is well-developed. She is not ill-appearing or toxic-appearing.  HENT:     Head: Normocephalic and atraumatic.     Nose: Nose normal.  Eyes:     General: No scleral icterus.       Right eye: No discharge.        Left eye: No discharge.     Conjunctiva/sclera: Conjunctivae normal.  Neck:     Musculoskeletal: Normal range of motion.     Vascular: No JVD.  Cardiovascular:     Rate and Rhythm: Normal rate and regular rhythm.     Pulses: Normal pulses.     Heart sounds: Normal heart sounds.  Pulmonary:     Effort: Pulmonary effort is normal.     Breath sounds: Normal breath sounds.  Abdominal:     General: There is no distension.  Musculoskeletal: Normal range of motion.     Comments: Full ROM of right ankle.  No tenderness to palpation of lateral or medial malleolus.  No deformity. Bruising over distal phalanx of the right great toe with mild tenderness to palpation. No open wound or laceration. Brisk cap refill. Able to wiggle all toes.  DP pulses 2+. Foot is not dusky in appearance.  No  tenderness to palpation of forefoot.  No tenderness to palpation of fifth metatarsal.    Skin:    General: Skin is warm and dry.  Neurological:     Mental Status: She is oriented to person, place, and  time.     GCS: GCS eye subscore is 4. GCS verbal subscore is 5. GCS motor subscore is 6.     Comments: Fluent speech, no facial droop.  Psychiatric:        Behavior: Behavior normal.      ED Treatments / Results  Labs (all labs ordered are listed, but only abnormal results are displayed) Labs Reviewed - No data to display  EKG None  Radiology Dg Foot Complete Right  Result Date: 11/18/2019 CLINICAL DATA:  Dropped heavy object on great toe EXAM: RIGHT FOOT COMPLETE - 3+ VIEW COMPARISON:  None. FINDINGS: Comminuted fracture noted through the distal phalanx of the right great toe. Minimally displaced fracture fragments. No subluxation or dislocation. Degenerative changes in the IP joints and 1st MTP joint. IMPRESSION: Comminuted, minimally displaced fracture through the right great toe distal phalanx. Electronically Signed   By: Rolm Baptise M.D.   On: 11/18/2019 19:02    Procedures Procedures (including critical care time)  Medications Ordered in ED Medications - No data to display   Initial Impression / Assessment and Plan / ED Course  I have reviewed the triage vital signs and the nursing notes.  Pertinent labs & imaging results that were available during my care of the patient were reviewed by me and considered in my medical decision making (see chart for details).  Patient seen and examined.  She is well-appearing, no acute distress.  She is presenting after dropping marble on her right great toe.  She is neurovascularly intact. Right DP pulse 2+.  X-ray of right foot viewed by me shows comminuted, minimally displaced fracture through right great toe distal phalanx.  The time of my exam patient is declining any pain and is not wish to have any pain medications. No significant  ecchymosis or subungual hematoma.  Postop shoe applied. I have reviewed the PDMP during this encounter.Will discharge with short course of pain medication. Pt advised to follow up with orthopedics or podiatry for further evaluation and treatment.. Patient will be dc home & is agreeable with above plan. I have also discussed reasons to return immediately to the ER.  Patient and daughter expresses understanding and agrees with plan. Findings and plan of care discussed with supervising physician Dr. Laverta Baltimore.  Portions of this note were generated with [employer]. Dictation errors may occur despite best attempts at proofreading.    Final Clinical Impressions(s) / ED Diagnoses   Final diagnoses:  Displaced fracture of distal phalanx of right great toe, initial encounter for closed fracture    ED Discharge Orders         Ordered    HYDROcodone-acetaminophen (NORCO/VICODIN) 5-325 MG tablet  Every 6 hours PRN     11/18/19 2021           Cherre Robins, PA-C 11/18/19 2152    Margette Fast, MD 11/19/19 1359

## 2019-11-18 NOTE — Discharge Instructions (Addendum)
You have been seen today for pain in your right great toe.  Please read and follow all provided instructions. Return to the emergency room for worsening condition or new concerning symptoms.    Your x-ray today shows a comminuted, minimally displaced fracture through the right great toe distal phalanx.  1. Medications:  Recommend to take Tylenol for pain.  Please take as prescribed. Continue usual home medications Take medications as prescribed. Please review all of the medicines and only take them if you do not have an allergy to them.   2. Treatment: rest, elevate, ice.  3. Follow Up:  Please follow up with orthopedist Dr. Marlou Sa or podiatry Dr. Amalia Hailey.  I have included information for both of their offices in your discharge paperwork.  Please call one of the offices tomorrow to schedule a follow-up appointment.  It is also a possibility that you have an allergic reaction to any of the medicines that you have been prescribed - Everybody reacts differently to medications and while MOST people have no trouble with most medicines, you may have a reaction such as nausea, vomiting, rash, swelling, shortness of breath. If this is the case, please stop taking the medicine immediately and contact your physician.  ?

## 2019-11-18 NOTE — ED Triage Notes (Signed)
Patient reports dropping a piece of marble on big toe on right foot about 1 hour ago.  5/10 pain.  Bruising and swelling noted.   A/ox4 Wheelchair in triage.

## 2019-11-19 ENCOUNTER — Encounter (HOSPITAL_COMMUNITY): Payer: Medicare Other

## 2019-11-19 ENCOUNTER — Ambulatory Visit: Payer: Medicare Other | Admitting: Family

## 2019-11-21 ENCOUNTER — Other Ambulatory Visit: Payer: Self-pay

## 2019-11-21 DIAGNOSIS — I779 Disorder of arteries and arterioles, unspecified: Secondary | ICD-10-CM

## 2019-11-25 ENCOUNTER — Telehealth: Payer: Self-pay | Admitting: Orthopedic Surgery

## 2019-11-25 NOTE — Telephone Encounter (Signed)
Pt called in said she was seen at [facility] 11/18/2019 for fracture of distal phalanx of right great toe, I told pt I would send a message to Dr.Dean and Ander Purpura F. To see when we could get her in this week but pt refused she asked me to make her an appt for next week. Pt has an appt for 12/16.

## 2019-11-26 ENCOUNTER — Encounter (HOSPITAL_COMMUNITY): Payer: Medicare Other

## 2019-11-26 ENCOUNTER — Ambulatory Visit: Payer: Medicare Other | Admitting: Family

## 2019-12-03 ENCOUNTER — Ambulatory Visit: Payer: Medicare Other | Admitting: Orthopedic Surgery

## 2019-12-24 ENCOUNTER — Other Ambulatory Visit: Payer: Self-pay

## 2019-12-24 DIAGNOSIS — I779 Disorder of arteries and arterioles, unspecified: Secondary | ICD-10-CM

## 2019-12-25 ENCOUNTER — Ambulatory Visit (HOSPITAL_COMMUNITY)
Admission: RE | Admit: 2019-12-25 | Discharge: 2019-12-25 | Disposition: A | Discharge status: Home / Self Care | Payer: Medicare PPO | Source: Ambulatory Visit | Attending: Family | Admitting: Family

## 2019-12-25 ENCOUNTER — Other Ambulatory Visit: Payer: Self-pay

## 2019-12-25 DIAGNOSIS — I779 Disorder of arteries and arterioles, unspecified: Secondary | ICD-10-CM | POA: Diagnosis not present

## 2019-12-31 NOTE — Progress Notes (Deleted)
   Virtual Visit via Telephone Note  Referring MD: ***  I connected with [name] on 12/31/2019 using the [employer] by telephone and verified that I was speaking with the correct person using two identifiers. Patient was located at *** and accompanied by ***. I am located at ***.   The limitations of evaluation and management by telemedicine and the availability of in person appointments have been previously discussed with the patient and are documented in the patients chart. The patient expressed understanding and consented to proceed.  PCP: Cari Caraway, MD   Chief Complaint: follow up  History of Present Illness: [name] is a 86 y.o. female pt of Dr. Oneida Alar that has been evaluated for pain in her left calf and foot, which was resolved at her last visit (11/11/18).  At that time, she states that when she sits sometimes, her left foot will turn purple but resolves spontaneously.  She did not walk much due to dizziness and falling.  She has ha hx of chronic back pain and multiple cervical and lumbar spine disc dz with loss of height.  She did not have any issues or hx of non healing wounds.   In early December, she had a comminuted, minimally displaced fx through the right great toe distal phalanx.  ***   Past Medical History:  Diagnosis Date  . Anxiety   . Back pain   . Depression   . Emphysema   . Esophageal spasm   . Lung nodule   . OSA (obstructive sleep apnea)   . Osteomalacia   . Vertigo   . Vitamin D deficiency     Past Surgical History:  Procedure Laterality Date  . APPENDECTOMY    . BREAST EXCISIONAL BIOPSY Left 1978  . CARPAL TUNNEL RELEASE    . ROTATOR CUFF REPAIR    ***  No outpatient medications have been marked as taking for the 01/01/20 encounter (Appointment) with [facility].    12 system ROS was negative unless otherwise noted in HPI***   Observations/Objective:  ABI/TBI 12/25/2019: Right:  0.99/0.70 Left:  0.98/0.77  Previous ABI/TBI  11/11/2018: Right: 0.82/0.58 Left:  0.80/0.63  Assessment and Plan:  86 y/o female with *** with hx of severe arthritis, multilevel spine dz with associated pain  -***   Follow Up Instructions:   Follow up {follow up:15908}   I discussed the assessment and treatment plan with the patient. The patient was provided an opportunity to ask questions and all were answered. The patient agreed with the plan and demonstrated an understanding of the instructions.   The patient was advised to call back or seek an in-person evaluation if the symptoms worsen or if the condition fails to improve as anticipated.  I spent *** minutes with the patient via telephone encounter.   Signed, Leontine Locket [facility] Office: [phone]

## 2020-01-01 ENCOUNTER — Other Ambulatory Visit: Payer: Self-pay

## 2020-01-01 ENCOUNTER — Ambulatory Visit: Payer: Medicare PPO

## 2020-01-06 ENCOUNTER — Telehealth: Payer: Self-pay | Admitting: *Deleted

## 2020-01-06 NOTE — Telephone Encounter (Incomplete)
Virtual Visit Pre-Appointment Phone Call  Today, I spoke with [name] and performed the following actions:  1. I explained that we are currently trying to limit exposure to the COVID-19 virus by seeing patients at home rather than in the office.  I explained that the visits are best done by video, but can be done by telephone.  I asked the patient if a virtual visit that the patient would like to try instead of coming into the office. [name] agreed to proceed with the virtual visit scheduled with PA  on 01/09/20.    2. I confirmed the BEST phone number to call the day of the visit and- I included this in appointment notes.  3. I asked if the patient had access to (through a family member/friend) a smartphone with video capability to be used for her visit?"  The patient said yes -         If the patient said yes, I documented "VIDEO" in the appointment notes.  If the patient said no, I documented "PHONE" in the appointment notes.  4. I confirmed consent by  a. sending through [place] or by email the [place] as written at the end of this message or  b. verbally as listed below. i. This visit is being performed in the setting of COVID-19. ii. All virtual visits are billed to your insurance company just like a normal visit would be.   iii. We'd like you to understand that the technology does not allow for your provider to perform an examination, and thus may limit your provider's ability to fully assess your condition.  iv. If your provider identifies any concerns that need to be evaluated in person, we will make arrangements to do so.   v. Finally, though the technology is pretty good, we cannot assure that it will always work on either your or our end, and in the setting of a video visit, we may have to convert it to a phone-only visit.  In either situation, we cannot ensure that we have a secure connection.   vi. Are you willing to  proceed?"  STAFF: Did the patient verbally acknowledge consent to telehealth visit? Document YES/NO here: ***  2. I advised the patient to be prepared - I asked that the patient, on the day of her visit, record any information possible with the equipment at her home, such as blood pressure, pulse, oxygen saturation, and your weight and write them all down. I asked the patient to have a pen and paper handy nearby the day of the visit as well.  3. If the patient was scheduled for a video visit, I informed the patient that the visit with the doctor would start with a text to the smartphone # given to [place] by the patient.         If the patient was scheduled for a telephone call, I informed the patient that the visit with the doctor would start with a call to the telephone # given to [place] by the patient.  4. I Informed patient they will receive a phone call 15 minutes prior to their appointment time from a [place] or nurse to review medications, allergies, etc. to prepare for the visit.    TELEPHONE CALL NOTE  [name] has been deemed a candidate for a follow-up tele-health visit to limit community exposure during the Covid-19 pandemic. I spoke with the patient via phone to  ensure availability of phone/video source, confirm preferred email & phone number, and discuss instructions and expectations.  I reminded [name] to be prepared with any vital sign and/or heart rhythm information that could potentially be obtained via home monitoring, at the time of her visit. I reminded [name] to expect a phone call prior to her visit.  Cleaster Corin, NT 01/06/2020 12:35 PM     FULL LENGTH CONSENT FOR TELE-HEALTH VISIT   I hereby voluntarily request, consent and authorize [facility] and its employed or contracted physicians, physician assistants, nurse practitioners or other licensed health care professionals (the Practitioner), to provide me with telemedicine health care services (the  "Services") as deemed necessary by the treating Practitioner. I acknowledge and consent to receive the Services by the Practitioner via telemedicine. I understand that the telemedicine visit will involve communicating with the Practitioner through live audiovisual communication technology and the disclosure of certain medical information by electronic transmission. I acknowledge that I have been given the opportunity to request an in-person assessment or other available alternative prior to the telemedicine visit and am voluntarily participating in the telemedicine visit.  I understand that I have the right to withhold or withdraw my consent to the use of telemedicine in the course of my care at any time, without affecting my right to future care or treatment, and that the Practitioner or I may terminate the telemedicine visit at any time. I understand that I have the right to inspect all information obtained and/or recorded in the course of the telemedicine visit and may receive copies of available information for a reasonable fee.  I understand that some of the potential risks of receiving the Services via telemedicine include:  Marland Kitchen Delay or interruption in medical evaluation due to technological equipment failure or disruption; . Information transmitted may not be sufficient (e.g. poor resolution of images) to allow for appropriate medical decision making by the Practitioner; and/or  . In rare instances, security protocols could fail, causing a breach of personal health information.  Furthermore, I acknowledge that it is my responsibility to provide information about my medical history, conditions and care that is complete and accurate to the best of my ability. I acknowledge that Practitioner's advice, recommendations, and/or decision may be based on factors not within their control, such as incomplete or inaccurate data provided by me or distortions of diagnostic images or specimens that may result from  electronic transmissions. I understand that the practice of medicine is not an exact science and that Practitioner makes no warranties or guarantees regarding treatment outcomes. I acknowledge that I will receive a copy of this consent concurrently upon execution via email to the email address I last provided but may also request a printed copy by calling the office of [facility].    I understand that my insurance will be billed for this visit.   I have read or had this consent read to me. . I understand the contents of this consent, which adequately explains the benefits and risks of the Services being provided via telemedicine.  . I have been provided ample opportunity to ask questions regarding this consent and the Services and have had my questions answered to my satisfaction. . I give my informed consent for the services to be provided through the use of telemedicine in my medical care  By participating in this telemedicine visit I agree to the above.

## 2020-01-09 ENCOUNTER — Ambulatory Visit (INDEPENDENT_AMBULATORY_CARE_PROVIDER_SITE_OTHER): Payer: Medicare PPO | Admitting: Physician Assistant

## 2020-01-09 ENCOUNTER — Ambulatory Visit: Payer: Medicare PPO | Attending: Internal Medicine

## 2020-01-09 ENCOUNTER — Other Ambulatory Visit: Payer: Self-pay

## 2020-01-09 VITALS — Ht 61.0 in | Wt 150.0 lb

## 2020-01-09 DIAGNOSIS — Z23 Encounter for immunization: Secondary | ICD-10-CM | POA: Insufficient documentation

## 2020-01-09 DIAGNOSIS — I779 Disorder of arteries and arterioles, unspecified: Secondary | ICD-10-CM

## 2020-01-09 DIAGNOSIS — Z79899 Other long term (current) drug therapy: Secondary | ICD-10-CM | POA: Diagnosis not present

## 2020-01-09 DIAGNOSIS — Z7982 Long term (current) use of aspirin: Secondary | ICD-10-CM

## 2020-01-09 NOTE — Progress Notes (Signed)
   Covid-19 Vaccination Clinic  Name:  [name]    MRN: [id number] DOB: Sep 08, 1932  01/09/2020  [name] was observed post Covid-19 immunization for 15 minutes without incidence. She was provided with Vaccine Information Sheet and instruction to access the V-Safe system.   [name] was instructed to call 911 with any severe reactions post vaccine: Marland Kitchen Difficulty breathing  . Swelling of your face and throat  . A fast heartbeat  . A bad rash all over your body  . Dizziness and weakness    Immunizations Administered    Name Date Dose VIS Date Route   Pfizer COVID-19 Vaccine 01/09/2020  4:39 PM 0.3 mL 11/28/2019 Intramuscular   Manufacturer: [place]   Lot: [id number]   [place]: [id number]

## 2020-01-09 NOTE — Progress Notes (Signed)
Virtual Visit via Telephone Note    I connected with [name] on 01/09/2020 using the by telephone and verified that I was speaking with the correct person using two identifiers. Patient was located at home. I am located at [facility] office.   The limitations of evaluation and management by telemedicine and the availability of in person appointments have been previously discussed with the patient and are documented in the patients chart. The patient expressed understanding and consented to proceed.  PCP: Cari Caraway, MD   Chief Complaint: no new complaints  History of Present Illness: [name] is a 86 y.o. female with [name] is a 86 y.o. female patient whom Dr. Oneida Alar has evaluated with c/ooccasional pain in her left calf and foot changing colors in a dependent position.  She denise rest pain and non healing wounds.  She is able to carry out activities of daily living.    Past Medical History:  Diagnosis Date  . Anxiety   . Back pain   . Depression   . Emphysema   . Esophageal spasm   . Lung nodule   . OSA (obstructive sleep apnea)   . Osteomalacia   . Vertigo   . Vitamin D deficiency     Past Surgical History:  Procedure Laterality Date  . APPENDECTOMY    . BREAST EXCISIONAL BIOPSY Left 1978  . CARPAL TUNNEL RELEASE    . ROTATOR CUFF REPAIR      No outpatient medications have been marked as taking for the 01/09/20 encounter (Appointment) with [facility].    12 system ROS was negative unless otherwise noted in HPI   Observations/Objective:   ABI Findings: +---------+------------------+-----+---------+--------+ Right    Rt Pressure (mmHg)IndexWaveform Comment  +---------+------------------+-----+---------+--------+ Brachial 143                                      +---------+------------------+-----+---------+--------+ ATA      150               0.99                    +---------+------------------+-----+---------+--------+ PTA      0                 0.00 absent            +---------+------------------+-----+---------+--------+ PERO     143               0.95 triphasic         +---------+------------------+-----+---------+--------+ DP                              triphasic         +---------+------------------+-----+---------+--------+ Great Toe106               0.70                   +---------+------------------+-----+---------+--------+  +---------+------------------+-----+---------+-------+ Left     Lt Pressure (mmHg)IndexWaveform Comment +---------+------------------+-----+---------+-------+ Brachial 151                                     +---------+------------------+-----+---------+-------+ ATA      148  0.98                  +---------+------------------+-----+---------+-------+ PTA      129               0.85 triphasic        +---------+------------------+-----+---------+-------+ DP                              triphasic        +---------+------------------+-----+---------+-------+ Great Toe116               0.77                  +---------+------------------+-----+---------+-------+  +-------+-----------+-----------+------------+------------+ ABI/TBIToday's ABIToday's TBIPrevious ABIPrevious TBI +-------+-----------+-----------+------------+------------+ Right  0.99       0.70       0.82        0.58         +-------+-----------+-----------+------------+------------+ Left   0.98       0.77       0.80        0.63         +-------+-----------+-----------+------------+------------+   Today's ABIs are comparable to prior study on 11/06/2017. Bilateral ABIs appear increased compared to prior study on 11/11/2018.   Summary: Right: Resting right ankle-brachial index is within normal range. No evidence of significant right lower extremity  arterial disease. The right toe-brachial index is normal. RT great toe pressure = 106 mmHg.  Left: Resting left ankle-brachial index is within normal range. No evidence of significant left lower extremity arterial disease. The left toe-brachial index is normal. LT Great toe pressure = 116 mmHg.   Assessment and Plan:   Follow Up Instructions:   Follow up in 1 year with  She continues to take Asprin and statin daily.     I discussed the assessment and treatment plan with the patient. The patient was provided an opportunity to ask questions and all were answered. The patient agreed with the plan and demonstrated an understanding of the instructions.   The patient was advised to call back or seek an in-person evaluation if the symptoms worsen or if the condition fails to improve as anticipated.  I spent 15 minutes with the patient via telephone encounter.   Signed, Roxy Horseman [facility] Office: [phone]  01/09/2020, 9:31 AM

## 2020-01-12 ENCOUNTER — Other Ambulatory Visit: Payer: Self-pay | Admitting: *Deleted

## 2020-01-12 DIAGNOSIS — I779 Disorder of arteries and arterioles, unspecified: Secondary | ICD-10-CM

## 2020-01-30 ENCOUNTER — Ambulatory Visit: Payer: Medicare PPO | Attending: Internal Medicine

## 2020-01-30 ENCOUNTER — Other Ambulatory Visit: Payer: Self-pay

## 2020-01-30 DIAGNOSIS — Z23 Encounter for immunization: Secondary | ICD-10-CM | POA: Insufficient documentation

## 2020-01-30 NOTE — Progress Notes (Signed)
   Covid-19 Vaccination Clinic  Name:  [name]    MRN: [id number] DOB: [birth year]  01/30/2020  [name] was observed post Covid-19 immunization for 15 minutes without incidence. She was provided with Vaccine Information Sheet and instruction to access the V-Safe system.   Ms. Peers was instructed to call 911 with any severe reactions post vaccine: Marland Kitchen Difficulty breathing  . Swelling of your face and throat  . A fast heartbeat  . A bad rash all over your body  . Dizziness and weakness    Immunizations Administered    Name Date Dose VIS Date Route   Pfizer COVID-19 Vaccine 01/30/2020  3:27 PM 0.3 mL 11/28/2019 Intramuscular   Manufacturer: [place]   Lot: [id number]   [place]: [id number]

## 2020-03-14 ENCOUNTER — Emergency Department (HOSPITAL_COMMUNITY): Payer: Medicare PPO

## 2020-03-14 ENCOUNTER — Encounter (HOSPITAL_COMMUNITY): Payer: Self-pay | Admitting: Emergency Medicine

## 2020-03-14 ENCOUNTER — Observation Stay (HOSPITAL_COMMUNITY)
Admission: EM | Admit: 2020-03-14 | Discharge: 2020-03-17 | Disposition: A | Discharge status: Home / Self Care | Payer: Medicare PPO | Attending: General Surgery | Admitting: General Surgery

## 2020-03-14 ENCOUNTER — Observation Stay (HOSPITAL_COMMUNITY): Payer: Medicare PPO

## 2020-03-14 ENCOUNTER — Other Ambulatory Visit: Payer: Self-pay

## 2020-03-14 DIAGNOSIS — F329 Major depressive disorder, single episode, unspecified: Secondary | ICD-10-CM | POA: Insufficient documentation

## 2020-03-14 DIAGNOSIS — Z79899 Other long term (current) drug therapy: Secondary | ICD-10-CM | POA: Insufficient documentation

## 2020-03-14 DIAGNOSIS — I700 Atherosclerosis of aorta: Secondary | ICD-10-CM | POA: Insufficient documentation

## 2020-03-14 DIAGNOSIS — Z91040 Latex allergy status: Secondary | ICD-10-CM | POA: Insufficient documentation

## 2020-03-14 DIAGNOSIS — M7989 Other specified soft tissue disorders: Secondary | ICD-10-CM | POA: Diagnosis not present

## 2020-03-14 DIAGNOSIS — M79673 Pain in unspecified foot: Secondary | ICD-10-CM

## 2020-03-14 DIAGNOSIS — Z7982 Long term (current) use of aspirin: Secondary | ICD-10-CM | POA: Diagnosis not present

## 2020-03-14 DIAGNOSIS — E785 Hyperlipidemia, unspecified: Secondary | ICD-10-CM | POA: Diagnosis not present

## 2020-03-14 DIAGNOSIS — R2681 Unsteadiness on feet: Secondary | ICD-10-CM | POA: Diagnosis not present

## 2020-03-14 DIAGNOSIS — S2242XA Multiple fractures of ribs, left side, initial encounter for closed fracture: Secondary | ICD-10-CM

## 2020-03-14 DIAGNOSIS — M79672 Pain in left foot: Secondary | ICD-10-CM | POA: Diagnosis not present

## 2020-03-14 DIAGNOSIS — R109 Unspecified abdominal pain: Secondary | ICD-10-CM | POA: Diagnosis not present

## 2020-03-14 DIAGNOSIS — Z20822 Contact with and (suspected) exposure to covid-19: Secondary | ICD-10-CM | POA: Insufficient documentation

## 2020-03-14 DIAGNOSIS — M839 Adult osteomalacia, unspecified: Secondary | ICD-10-CM | POA: Diagnosis not present

## 2020-03-14 DIAGNOSIS — W19XXXA Unspecified fall, initial encounter: Secondary | ICD-10-CM

## 2020-03-14 DIAGNOSIS — W010XXA Fall on same level from slipping, tripping and stumbling without subsequent striking against object, initial encounter: Secondary | ICD-10-CM | POA: Insufficient documentation

## 2020-03-14 DIAGNOSIS — R42 Dizziness and giddiness: Secondary | ICD-10-CM | POA: Insufficient documentation

## 2020-03-14 DIAGNOSIS — F419 Anxiety disorder, unspecified: Secondary | ICD-10-CM | POA: Insufficient documentation

## 2020-03-14 DIAGNOSIS — Z87891 Personal history of nicotine dependence: Secondary | ICD-10-CM | POA: Insufficient documentation

## 2020-03-14 DIAGNOSIS — S2249XA Multiple fractures of ribs, unspecified side, initial encounter for closed fracture: Secondary | ICD-10-CM | POA: Diagnosis present

## 2020-03-14 DIAGNOSIS — G4733 Obstructive sleep apnea (adult) (pediatric): Secondary | ICD-10-CM | POA: Diagnosis not present

## 2020-03-14 DIAGNOSIS — J90 Pleural effusion, not elsewhere classified: Secondary | ICD-10-CM | POA: Insufficient documentation

## 2020-03-14 DIAGNOSIS — J439 Emphysema, unspecified: Secondary | ICD-10-CM | POA: Insufficient documentation

## 2020-03-14 DIAGNOSIS — S270XXA Traumatic pneumothorax, initial encounter: Secondary | ICD-10-CM | POA: Diagnosis not present

## 2020-03-14 DIAGNOSIS — S2239XA Fracture of one rib, unspecified side, initial encounter for closed fracture: Secondary | ICD-10-CM | POA: Diagnosis present

## 2020-03-14 DIAGNOSIS — J939 Pneumothorax, unspecified: Secondary | ICD-10-CM

## 2020-03-14 LAB — URINALYSIS, ROUTINE W REFLEX MICROSCOPIC
Bacteria, UA: NONE SEEN
Bilirubin Urine: NEGATIVE
Glucose, UA: NEGATIVE mg/dL
Ketones, ur: 5 mg/dL — AB
Leukocytes,Ua: NEGATIVE
Nitrite: NEGATIVE
Protein, ur: NEGATIVE mg/dL
Specific Gravity, Urine: 1.039 — ABNORMAL HIGH (ref 1.005–1.030)
pH: 7 (ref 5.0–8.0)

## 2020-03-14 LAB — CBC WITH DIFFERENTIAL/PLATELET
Abs Immature Granulocytes: 0.05 10*3/uL (ref 0.00–0.07)
Basophils Absolute: 0 10*3/uL (ref 0.0–0.1)
Basophils Relative: 0 %
Eosinophils Absolute: 0.2 10*3/uL (ref 0.0–0.5)
Eosinophils Relative: 2 %
HCT: 43.6 % (ref 36.0–46.0)
Hemoglobin: 14.5 g/dL (ref 12.0–15.0)
Immature Granulocytes: 1 %
Lymphocytes Relative: 11 %
Lymphs Abs: 1 10*3/uL (ref 0.7–4.0)
MCH: 30.9 pg (ref 26.0–34.0)
MCHC: 33.3 g/dL (ref 30.0–36.0)
MCV: 92.8 fL (ref 80.0–100.0)
Monocytes Absolute: 0.5 10*3/uL (ref 0.1–1.0)
Monocytes Relative: 6 %
Neutro Abs: 7.6 10*3/uL (ref 1.7–7.7)
Neutrophils Relative %: 80 %
Platelets: 162 10*3/uL (ref 150–400)
RBC: 4.7 MIL/uL (ref 3.87–5.11)
RDW: 12.7 % (ref 11.5–15.5)
WBC: 9.4 10*3/uL (ref 4.0–10.5)
nRBC: 0 % (ref 0.0–0.2)

## 2020-03-14 LAB — I-STAT CHEM 8, ED
BUN: 15 mg/dL (ref 8–23)
Calcium, Ion: 1.18 mmol/L (ref 1.15–1.40)
Chloride: 106 mmol/L (ref 98–111)
Creatinine, Ser: 0.6 mg/dL (ref 0.44–1.00)
Glucose, Bld: 102 mg/dL — ABNORMAL HIGH (ref 70–99)
HCT: 42 % (ref 36.0–46.0)
Hemoglobin: 14.3 g/dL (ref 12.0–15.0)
Potassium: 4.1 mmol/L (ref 3.5–5.1)
Sodium: 140 mmol/L (ref 135–145)
TCO2: 28 mmol/L (ref 22–32)

## 2020-03-14 LAB — CK: Total CK: 80 U/L (ref 38–234)

## 2020-03-14 MED ORDER — METHOCARBAMOL 500 MG PO TABS
500.0000 mg | ORAL_TABLET | Freq: Four times a day (QID) | ORAL | Status: DC | PRN
  Administered 2020-03-15 (×2): 500 mg via ORAL
  Filled 2020-03-14 (×2): qty 1

## 2020-03-14 MED ORDER — IOHEXOL 300 MG/ML  SOLN
100.0000 mL | Freq: Once | INTRAMUSCULAR | Status: AC | PRN
  Administered 2020-03-14: 100 mL via INTRAVENOUS

## 2020-03-14 MED ORDER — OXYCODONE HCL 5 MG PO TABS
5.0000 mg | ORAL_TABLET | ORAL | Status: DC | PRN
  Administered 2020-03-15 (×3): 5 mg via ORAL
  Filled 2020-03-14 (×3): qty 1

## 2020-03-14 MED ORDER — HYDROMORPHONE HCL 1 MG/ML IJ SOLN
0.5000 mg | INTRAMUSCULAR | Status: DC | PRN
  Administered 2020-03-15: 0.5 mg via INTRAVENOUS
  Filled 2020-03-14: qty 1

## 2020-03-14 MED ORDER — SODIUM CHLORIDE 0.9% FLUSH: 3.0000 mL | INTRAVENOUS | Status: DC | PRN

## 2020-03-14 MED ORDER — HYDRALAZINE HCL 20 MG/ML IJ SOLN: 10.0000 mg | INTRAMUSCULAR | Status: DC | PRN

## 2020-03-14 MED ORDER — SODIUM CHLORIDE 0.9% FLUSH
3.0000 mL | Freq: Two times a day (BID) | INTRAVENOUS | Status: DC
  Administered 2020-03-15 – 2020-03-16 (×4): 3 mL via INTRAVENOUS

## 2020-03-14 MED ORDER — ONDANSETRON HCL 4 MG/2ML IJ SOLN: 4.0000 mg | Freq: Four times a day (QID) | INTRAMUSCULAR | Status: DC | PRN

## 2020-03-14 MED ORDER — HYDROMORPHONE HCL 1 MG/ML IJ SOLN
0.5000 mg | Freq: Once | INTRAMUSCULAR | Status: AC
  Administered 2020-03-14: 0.5 mg via INTRAVENOUS
  Filled 2020-03-14: qty 1

## 2020-03-14 MED ORDER — HYDROMORPHONE HCL 1 MG/ML IJ SOLN: 1.0000 mg | Freq: Once | INTRAMUSCULAR | Status: DC

## 2020-03-14 MED ORDER — BUPROPION HCL ER (XL) 150 MG PO TB24
150.0000 mg | ORAL_TABLET | Freq: Every day | ORAL | Status: DC
  Administered 2020-03-15 – 2020-03-17 (×3): 150 mg via ORAL
  Filled 2020-03-14 (×3): qty 1

## 2020-03-14 MED ORDER — SODIUM CHLORIDE 0.9 % IV SOLN: 250.0000 mL | INTRAVENOUS | Status: DC | PRN

## 2020-03-14 MED ORDER — ASPIRIN EC 81 MG PO TBEC
81.0000 mg | DELAYED_RELEASE_TABLET | Freq: Every day | ORAL | Status: DC
  Administered 2020-03-15 – 2020-03-17 (×4): 81 mg via ORAL
  Filled 2020-03-14 (×3): qty 1

## 2020-03-14 MED ORDER — ONDANSETRON HCL 4 MG/2ML IJ SOLN
4.0000 mg | Freq: Once | INTRAMUSCULAR | Status: AC
  Administered 2020-03-14: 4 mg via INTRAVENOUS
  Filled 2020-03-14: qty 2

## 2020-03-14 MED ORDER — ALBUTEROL SULFATE (2.5 MG/3ML) 0.083% IN NEBU: 3.0000 mL | INHALATION_SOLUTION | Freq: Four times a day (QID) | RESPIRATORY_TRACT | Status: DC | PRN

## 2020-03-14 MED ORDER — GABAPENTIN 100 MG PO CAPS
100.0000 mg | ORAL_CAPSULE | Freq: Three times a day (TID) | ORAL | Status: DC
  Administered 2020-03-15 – 2020-03-17 (×8): 100 mg via ORAL
  Filled 2020-03-14 (×8): qty 1

## 2020-03-14 MED ORDER — ACETAMINOPHEN 325 MG PO TABS
650.0000 mg | ORAL_TABLET | Freq: Four times a day (QID) | ORAL | Status: DC
  Administered 2020-03-15 – 2020-03-16 (×6): 650 mg via ORAL
  Filled 2020-03-14 (×6): qty 2

## 2020-03-14 MED ORDER — ESCITALOPRAM OXALATE 10 MG PO TABS
40.0000 mg | ORAL_TABLET | Freq: Every day | ORAL | Status: DC
  Administered 2020-03-15 – 2020-03-17 (×3): 40 mg via ORAL
  Filled 2020-03-14 (×3): qty 4

## 2020-03-14 MED ORDER — UMECLIDINIUM BROMIDE 62.5 MCG/INH IN AEPB
1.0000 | INHALATION_SPRAY | Freq: Every day | RESPIRATORY_TRACT | Status: DC
  Administered 2020-03-16 – 2020-03-17 (×2): 1 via RESPIRATORY_TRACT
  Filled 2020-03-14: qty 7

## 2020-03-14 MED ORDER — MORPHINE SULFATE (PF) 2 MG/ML IV SOLN: 2.0000 mg | Freq: Once | INTRAVENOUS | Status: DC

## 2020-03-14 MED ORDER — METOPROLOL TARTRATE 5 MG/5ML IV SOLN: 5.0000 mg | Freq: Four times a day (QID) | INTRAVENOUS | Status: DC | PRN

## 2020-03-14 MED ORDER — DOCUSATE SODIUM 100 MG PO CAPS
100.0000 mg | ORAL_CAPSULE | Freq: Two times a day (BID) | ORAL | Status: DC
  Administered 2020-03-15 – 2020-03-17 (×6): 100 mg via ORAL
  Filled 2020-03-14 (×6): qty 1

## 2020-03-14 MED ORDER — MORPHINE SULFATE (PF) 4 MG/ML IV SOLN
4.0000 mg | Freq: Once | INTRAVENOUS | Status: AC
  Administered 2020-03-14: 4 mg via INTRAVENOUS
  Filled 2020-03-14: qty 1

## 2020-03-14 MED ORDER — SODIUM CHLORIDE (PF) 0.9 % IJ SOLN
INTRAMUSCULAR | Status: AC
  Filled 2020-03-14: qty 50

## 2020-03-14 MED ORDER — IBUPROFEN 200 MG PO TABS
800.0000 mg | ORAL_TABLET | Freq: Three times a day (TID) | ORAL | Status: DC
  Administered 2020-03-15 – 2020-03-17 (×8): 800 mg via ORAL
  Filled 2020-03-14 (×8): qty 4

## 2020-03-14 MED ORDER — ONDANSETRON 4 MG PO TBDP: 4.0000 mg | ORAL_TABLET | Freq: Four times a day (QID) | ORAL | Status: DC | PRN

## 2020-03-14 NOTE — ED Triage Notes (Signed)
BIB [facility] from home. Pt fell backwards and hit left side on door. Now c/o flank pain on left side. Pt denies hitting her head. Pt denies dizziness and chest discomfort that may would have caused her fall. A&O x 4. Not on blood thinners   95% RA 142/100 80 HR 22 RR

## 2020-03-14 NOTE — ED Provider Notes (Signed)
Medical screening examination/treatment/procedure(s) were conducted as a shared visit with non-physician practitioner(s) and myself.  I personally evaluated the patient during the encounter.    Patient was trying to disarm a smoke detector with her cane.  She lost her balance and fell backwards striking her thoracic back.  She has severe pain in her left shoulder and left upper back.  Hurts to take a deep breath.  She denies she struck her head.  No headache.  No lower extremity pain.  Patient is alert and appropriate.  No respiratory distress.  Normocephalic/atraumatic.  Heart regular.  She is taking shallow breaths but is not tachypneic or in respiratory distress.  Sounds soft.  Abdomen soft nontender.  Lower extremities symmetric without deformity.  Patient had 4 mg morphine approximately 30 minutes prior to evaluation.  She reports it helped but still has significant pain.  Will add 2 mg additional , patient will be going to CT scan.  I agree with plan of management.   Charlesetta Shanks, MD 03/14/20 [phone]

## 2020-03-14 NOTE — ED Notes (Signed)
[facility]ini lab staff made RN aware that results of I-state chem 8 would not transfer to the pts chart. Made MD/PA/CT aware of results. creatinine 0.6

## 2020-03-14 NOTE — ED Notes (Signed)
[facility] called for transport. 

## 2020-03-14 NOTE — H&P (Signed)
Surgical Evaluation Requesting provider: Domenic Moras PA-C  Chief Complaint: fall  HPI: Very nice 86yo woman who sustained a ground level fall around 2:65m today when she reached up with her cane to disable a smoke alarm. She hit her back on a wooden chest and then fell to the ground. Denies loss of consciousness or impact to anywhere else. Has left chest wall pain, left shoulder pain. Pain is relieved with medication in the ER. Aggravated by movement. Denies headache, neck pain, abdominal pain or extremity pain.   Allergies  Allergen Reactions  . Adhesive [Tape] Itching and Other (See Comments)    Little red itchy bumps develop  . Latex Itching and Other (See Comments)    Little red itchy bumps develop    Past Medical History:  Diagnosis Date  . Anxiety   . Back pain   . Depression   . Emphysema   . Esophageal spasm   . Lung nodule   . OSA (obstructive sleep apnea)   . Osteomalacia   . Vertigo   . Vitamin D deficiency     Past Surgical History:  Procedure Laterality Date  . APPENDECTOMY    . BREAST EXCISIONAL BIOPSY Left 1978  . CARPAL TUNNEL RELEASE    . ROTATOR CUFF REPAIR      Family History  Problem Relation Age of Onset  . Diabetes type II Other   . Hypertension Other   . Breast cancer Maternal Aunt   . Other Mother        died of natural causes at age 22  . Other Father        stroke or heart attack while in shower    Social History   Socioeconomic History  . Marital status: Widowed    Spouse name: Not on file  . Number of children: 4  . Years of education: Bachelors  . Highest education level: Not on file  Occupational History  . Occupation: Retired  Tobacco Use  . Smoking status: Former Smoker    Packs/day: 1.00    Types: Cigarettes    Quit date: 03/2016    Years since quitting: 3.9  . Smokeless tobacco: Never Used  Substance and Sexual Activity  . Alcohol use: No    Alcohol/week: 0.0 standard drinks  . Drug use: No  . Sexual activity: Not  Currently  Other Topics Concern  . Not on file  Social History Narrative   Lives at home alone.   Right-handed.   1.5 cups caffeine per day, occasional Diet Coke.   Social Determinants of Health   Financial Resource Strain:   . Difficulty of Paying Living Expenses:   Food Insecurity:   . Worried About Charity fundraiser in the Last Year:   . Arboriculturist in the Last Year:   Transportation Needs:   . Film/video editor (Medical):   Marland Kitchen Lack of Transportation (Non-Medical):   Physical Activity:   . Days of Exercise per Week:   . Minutes of Exercise per Session:   Stress:   . Feeling of Stress :   Social Connections:   . Frequency of Communication with Friends and Family:   . Frequency of Social Gatherings with Friends and Family:   . Attends Religious Services:   . Active Member of Clubs or Organizations:   . Attends Archivist Meetings:   Marland Kitchen Marital Status:     No current facility-administered medications on file prior to encounter.   Current Outpatient  Medications on File Prior to Encounter  Medication Sig Dispense Refill  . acetaminophen (TYLENOL 8 HOUR) 650 MG CR tablet Take 650 mg by mouth 2 (two) times daily as needed for pain.     Marland Kitchen albuterol (PROVENTIL HFA;VENTOLIN HFA) 108 (90 Base) MCG/ACT inhaler Inhale 2 puffs into the lungs every 6 (six) hours as needed for wheezing or shortness of breath.    Marland Kitchen aspirin EC 81 MG tablet Take 81 mg by mouth daily.    Marland Kitchen atorvastatin (LIPITOR) 10 MG tablet Take 10 mg by mouth at bedtime.    Marland Kitchen buPROPion (WELLBUTRIN XL) 150 MG 24 hr tablet Take 150 mg by mouth daily.    . Cholecalciferol (VITAMIN D) 2000 units tablet Take 2,000 Units by mouth daily.     Marland Kitchen escitalopram (LEXAPRO) 20 MG tablet Take 40 mg by mouth daily.  2  . fluticasone (FLONASE) 50 MCG/ACT nasal spray Place 2 sprays into both nostrils daily.    . Multiple Vitamin (MULTIVITAMIN WITH MINERALS) TABS tablet Take 1 tablet by mouth daily.    Vladimir Faster  Glycol-Propyl Glycol (SYSTANE) 0.4-0.3 % SOLN Place 1 drop into both eyes daily as needed (dry eyes).    Marland Kitchen tiotropium (SPIRIVA) 18 MCG inhalation capsule Place 18 mcg into inhaler and inhale daily.    Marland Kitchen acetaminophen (TYLENOL) 500 MG tablet Take 500 mg by mouth every 6 (six) hours as needed for moderate pain.    . cyclobenzaprine (FLEXERIL) 5 MG tablet Take 1 tablet (5 mg total) by mouth 2 (two) times daily as needed for muscle spasms. (Patient not taking: Reported on 03/14/2020) 16 tablet 0  . HYDROcodone-acetaminophen (NORCO/VICODIN) 5-325 MG tablet Take 1 tablet by mouth every 6 (six) hours as needed for severe pain. (Patient not taking: Reported on 01/09/2020) 8 tablet 0  . meclizine (ANTIVERT) 25 MG tablet Take 1 tablet (25 mg total) by mouth 3 (three) times daily as needed for dizziness. (Patient not taking: Reported on 03/14/2020) 20 tablet 0    Review of Systems: a complete, 10pt review of systems was completed with pertinent positives and negatives as documented in the HPI  Physical Exam: Vitals:   03/14/20 1830 03/14/20 1930  BP: (!) 148/73 (!) 165/77  Pulse: 64 75  Resp: 12 12  Temp:    SpO2: 97% 98%   Gen: A&Ox3, no distress  Eyes: lids and conjunctivae normal, no icterus. Pupils equally round and reactive to light.  Neck: supple without mass or thyromegaly Chest: respiratory effort is normal. No crepitus or tenderness on palpation of the anterior chest. Breath sounds equal.  Cardiovascular: RRR with palpable distal pulses, no pedal edema Gastrointestinal: soft, nondistended, nontender. No mass, hepatomegaly or splenomegaly. No hernia. Well healed low midline laparotomy Lymphatic: no lymphadenopathy in the neck or groin Muscoloskeletal: no clubbing or cyanosis of the fingers.  Strength is symmetrical throughout.  Range of motion of bilateral upper and lower extremities normal without pain, crepitation or contracture. Neuro: cranial nerves grossly intact.  Sensation intact to light  touch diffusely. Psych: appropriate mood and affect, normal insight/judgment intact  Skin: warm and dry   CBC Latest Ref Rng & Units 03/14/2020 03/14/2020 09/15/2017  WBC 4.0 - 10.5 K/uL - 9.4 6.2  Hemoglobin 12.0 - 15.0 g/dL 14.3 14.5 14.2  Hematocrit 36.0 - 46.0 % 42.0 43.6 42.0  Platelets 150 - 400 K/uL - 162 152    CMP Latest Ref Rng & Units 03/14/2020 09/15/2017 06/19/2017  Glucose 70 - 99 mg/dL 102(H) 117(H)  90  BUN 8 - 23 mg/dL 15 14 15   Creatinine 0.44 - 1.00 mg/dL 0.60 0.73 0.67  Sodium 135 - 145 mmol/L 140 138 139  Potassium 3.5 - 5.1 mmol/L 4.1 3.6 4.0  Chloride 98 - 111 mmol/L 106 104 103  CO2 22 - 32 mmol/L - 26 28  Calcium 8.9 - 10.3 mg/dL - 8.9 9.0  Total Protein 6.0 - 8.3 g/dL - - -  Total Bilirubin 0.3 - 1.2 mg/dL - - -  Alkaline Phos 39 - 117 U/L - - -  AST 0 - 37 U/L - - -  ALT 0 - 35 U/L - - -    No results found for: INR, PROTIME  Imaging: CT Chest W Contrast  Result Date: 03/14/2020 CLINICAL DATA:  [place], left flank pain, left-sided rib fractures EXAM: CT CHEST, ABDOMEN, AND PELVIS WITH CONTRAST TECHNIQUE: Multidetector CT imaging of the chest, abdomen and pelvis was performed following the standard protocol during bolus administration of intravenous contrast. CONTRAST:  128mL OMNIPAQUE IOHEXOL 300 MG/ML  SOLN COMPARISON:  03/14/2020, 07/01/2012 FINDINGS: CT CHEST FINDINGS Cardiovascular: Heart and great vessels are unremarkable without pericardial effusion. There is moderate atherosclerosis throughout the aorta and coronary vessels. No evidence of vascular injury. Mediastinum/Nodes: No enlarged mediastinal, hilar, or axillary lymph nodes. Thyroid gland, trachea, and esophagus demonstrate no significant findings. Lungs/Pleura: There is a trace left-sided pneumothorax anteriorly and at the apex, volume estimated less than 5%. Trace left pleural fluid. Dependent hypoventilatory changes of than the bilateral lower lobes. Mild upper lobe predominant emphysema. No acute  airspace disease. Central airways are patent. Musculoskeletal: There are displaced left posterior fifth through seventh rib fractures. There is adjacent soft tissue edema and subcutaneous gas. No other acute displaced fractures. Reconstructed images demonstrate no additional findings. CT ABDOMEN PELVIS FINDINGS Hepatobiliary: No hepatic injury or perihepatic hematoma. Gallbladder is unremarkable Pancreas: Unremarkable. No pancreatic ductal dilatation or surrounding inflammatory changes. Spleen: No splenic injury or perisplenic hematoma. Adrenals/Urinary Tract: Adrenal glands are unremarkable. Kidneys are normal, without renal calculi, focal lesion, or hydronephrosis. Bladder is unremarkable. Stomach/Bowel: No bowel obstruction or ileus. No bowel wall thickening or inflammatory change. The appendix is surgically absent. Vascular/Lymphatic: Aortic atherosclerosis. No enlarged abdominal or pelvic lymph nodes. Reproductive: Status post hysterectomy. No adnexal masses. Other: No abdominal wall hernia or abnormality. No abdominopelvic ascites. Musculoskeletal: There are no acute displaced fractures. Multilevel thoracolumbar spondylosis is noted. Reconstructed images demonstrate no additional findings. IMPRESSION: 1. Trace left-sided pneumothorax volume estimated less than 5%. 2. Displaced left posterior fifth through seventh rib fractures, with surrounding soft tissue swelling and minimal soft tissue gas. 3. Trace left pleural effusion. 4. No acute intra-abdominal or intrapelvic trauma. Electronically Signed   By: Randa Ngo M.D.   On: 03/14/2020 19:36   CT ABDOMEN PELVIS W CONTRAST  Result Date: 03/14/2020 CLINICAL DATA:  [place], left flank pain, left-sided rib fractures EXAM: CT CHEST, ABDOMEN, AND PELVIS WITH CONTRAST TECHNIQUE: Multidetector CT imaging of the chest, abdomen and pelvis was performed following the standard protocol during bolus administration of intravenous contrast. CONTRAST:  153mL OMNIPAQUE  IOHEXOL 300 MG/ML  SOLN COMPARISON:  03/14/2020, 07/01/2012 FINDINGS: CT CHEST FINDINGS Cardiovascular: Heart and great vessels are unremarkable without pericardial effusion. There is moderate atherosclerosis throughout the aorta and coronary vessels. No evidence of vascular injury. Mediastinum/Nodes: No enlarged mediastinal, hilar, or axillary lymph nodes. Thyroid gland, trachea, and esophagus demonstrate no significant findings. Lungs/Pleura: There is a trace left-sided pneumothorax anteriorly and at the apex, volume estimated  less than 5%. Trace left pleural fluid. Dependent hypoventilatory changes of than the bilateral lower lobes. Mild upper lobe predominant emphysema. No acute airspace disease. Central airways are patent. Musculoskeletal: There are displaced left posterior fifth through seventh rib fractures. There is adjacent soft tissue edema and subcutaneous gas. No other acute displaced fractures. Reconstructed images demonstrate no additional findings. CT ABDOMEN PELVIS FINDINGS Hepatobiliary: No hepatic injury or perihepatic hematoma. Gallbladder is unremarkable Pancreas: Unremarkable. No pancreatic ductal dilatation or surrounding inflammatory changes. Spleen: No splenic injury or perisplenic hematoma. Adrenals/Urinary Tract: Adrenal glands are unremarkable. Kidneys are normal, without renal calculi, focal lesion, or hydronephrosis. Bladder is unremarkable. Stomach/Bowel: No bowel obstruction or ileus. No bowel wall thickening or inflammatory change. The appendix is surgically absent. Vascular/Lymphatic: Aortic atherosclerosis. No enlarged abdominal or pelvic lymph nodes. Reproductive: Status post hysterectomy. No adnexal masses. Other: No abdominal wall hernia or abnormality. No abdominopelvic ascites. Musculoskeletal: There are no acute displaced fractures. Multilevel thoracolumbar spondylosis is noted. Reconstructed images demonstrate no additional findings. IMPRESSION: 1. Trace left-sided  pneumothorax volume estimated less than 5%. 2. Displaced left posterior fifth through seventh rib fractures, with surrounding soft tissue swelling and minimal soft tissue gas. 3. Trace left pleural effusion. 4. No acute intra-abdominal or intrapelvic trauma. Electronically Signed   By: Randa Ngo M.D.   On: 03/14/2020 19:36   DG Chest Port 1 View  Result Date: 03/14/2020 CLINICAL DATA:  Pain in left shoulder and left upper back. EXAM: PORTABLE CHEST 1 VIEW COMPARISON:  CT from same day. FINDINGS: There is a small left-sided pneumothorax. There are multiple displaced left-sided rib fractures involving the posterior fifth through seventh ribs. There is atelectasis at the left lung base with a probable small left-sided pleural effusion. The heart size is normal. IMPRESSION: Displaced left-sided rib fractures with a small left-sided pneumothorax, increased in size since x-ray dated 03/14/2020 at 3:59 p.m. Electronically Signed   By: Constance Holster M.D.   On: 03/14/2020 20:09   DG Chest Portable 1 View  Result Date: 03/14/2020 CLINICAL DATA:  [place], left-sided rib pain, emphysema, previous tobacco abuse EXAM: PORTABLE CHEST 1 VIEW COMPARISON:  09/15/2017 FINDINGS: Single frontal view of the chest demonstrates displaced posterior fifth through seventh rib fractures. No airspace disease, effusion, or pneumothorax. Cardiac silhouette is unremarkable. Stable atherosclerosis of the aortic arch. IMPRESSION: 1. Displaced fractures of the left fifth through seventh ribs in the posterior midclavicular line. 2. No effusion or pneumothorax.  No acute intrathoracic process. Electronically Signed   By: Randa Ngo M.D.   On: 03/14/2020 16:16   DG Abd Portable 1 View  Result Date: 03/14/2020 CLINICAL DATA:  [place] on left side, left-sided pain EXAM: PORTABLE ABDOMEN - 1 VIEW COMPARISON:  09/15/2017 FINDINGS: Supine frontal view of the abdomen and pelvis demonstrates an unremarkable bowel gas pattern. No masses or  abnormal calcifications. No displaced fractures. Multilevel thoracolumbar spondylosis and facet hypertrophy, with stable mild right convex lumbar scoliosis. IMPRESSION: 1. Degenerative changes of the thoracolumbar spine. 2. No acute bony abnormalities. 3. Unremarkable gas pattern. Electronically Signed   By: Randa Ngo M.D.   On: 03/14/2020 16:17     A/P: 86yo woman s/p ground level fall Left 5-7 rib fx, small pneumothorax, trace effusion: admit to progressive unit for monitoring, multimodal pain control, pulmonary toilet. Repeat CXR in AM.    ADMISSION STATUS: observation  Patient Active Problem List   Diagnosis Date Noted  . Rib fractures 03/14/2020  . Vertigo 09/13/2017  . Abnormal finding on imaging  04/22/2012  . Dyslipidemia 04/22/2012  . Depression 04/22/2012  . Anxiety 04/22/2012  . Tobacco abuse 04/22/2012  . COPD (chronic obstructive pulmonary disease) ([place]) 04/22/2012  . Gait abnormality 04/22/2012       Romana Juniper, MD [facility]  See AMION to contact appropriate on-call provider

## 2020-03-14 NOTE — ED Provider Notes (Signed)
[facility] DE[facility] Provider Note   CSN: Taylor Arrival date & time: 03/14/20  1501     History Chief Complaint  Patient presents with  . Fall    [name] is a 86 y.o. female.  The history is provided by the patient and medical records. No language interpreter was used.  Fall     86 year old female with history of osteomalacia, vertigo, gait abnormality, COPD brought here via EMS from home for evaluation of fall.  Patient report prior to arrival she was at home when her smoke alarm went off.  She tries to use her cane to turn it off when she lost her balance fell backwards striking her back against a wooden chest and fell to the ground.  She denies hitting her head or loss of consciousness.  She report acute onset of sharp severe pain to her left upper back and was unable to get off from the ground.  She does have a life alert necklace that she was able to press for help.  She is currently complaining of severe pain to her back and having trouble breathing.  Increasing pain with taking deep breath with any movement.  She does not complain of any hip pain or abdominal pain no pain to her extremities no focal numbness or focal weakness.  She is not on any blood thinner medication.  She denies any precipitating symptoms prior to the fall.  Past Medical History:  Diagnosis Date  . Anxiety   . Back pain   . Depression   . Emphysema   . Esophageal spasm   . Lung nodule   . OSA (obstructive sleep apnea)   . Osteomalacia   . Vertigo   . Vitamin D deficiency     Patient Active Problem List   Diagnosis Date Noted  . Vertigo 09/13/2017  . Abnormal finding on imaging 04/22/2012  . Dyslipidemia 04/22/2012  . Depression 04/22/2012  . Anxiety 04/22/2012  . Tobacco abuse 04/22/2012  . COPD (chronic obstructive pulmonary disease) ([place]) 04/22/2012  . Gait abnormality 04/22/2012    Past Surgical History:  Procedure Laterality Date  . APPENDECTOMY     . BREAST EXCISIONAL BIOPSY Left 1978  . CARPAL TUNNEL RELEASE    . ROTATOR CUFF REPAIR       OB History   No obstetric history on file.     Family History  Problem Relation Age of Onset  . Diabetes type II Other   . Hypertension Other   . Breast cancer Maternal Aunt   . Other Mother        died of natural causes at age 70  . Other Father        stroke or heart attack while in shower    Social History   Tobacco Use  . Smoking status: Former Smoker    Packs/day: 1.00    Types: Cigarettes    Quit date: 03/2016    Years since quitting: 3.9  . Smokeless tobacco: Never Used  Substance Use Topics  . Alcohol use: No    Alcohol/week: 0.0 standard drinks  . Drug use: No    Home Medications Prior to Admission medications   Medication Sig Start Date End Date Taking? Authorizing Provider  acetaminophen (TYLENOL 8 HOUR) 650 MG CR tablet Take 650 mg by mouth 2 (two) times daily.     [provider]  acetaminophen (TYLENOL) 500 MG tablet Take 500 mg by mouth every 6 (six) hours as needed  for moderate pain.    [provider]  albuterol (PROVENTIL HFA;VENTOLIN HFA) 108 (90 Base) MCG/ACT inhaler Inhale 2 puffs into the lungs every 6 (six) hours as needed for wheezing or shortness of breath.    [provider]  aspirin EC 81 MG tablet Take 81 mg by mouth daily.    [provider]  atorvastatin (LIPITOR) 10 MG tablet Take 10 mg by mouth at bedtime.    [provider]  buPROPion (WELLBUTRIN XL) 150 MG 24 hr tablet Take 150 mg by mouth daily.    [provider]  Cholecalciferol (VITAMIN D) 2000 units tablet Take 2,000 Units by mouth daily.     [provider]  cyclobenzaprine (FLEXERIL) 5 MG tablet Take 1 tablet (5 mg total) by mouth 2 (two) times daily as needed for muscle spasms. 04/08/18   Gareth Morgan, MD  escitalopram (LEXAPRO) 20 MG tablet Take 40 mg by mouth daily. 08/01/17   [provider]  fluticasone  (FLONASE) 50 MCG/ACT nasal spray Place 2 sprays into both nostrils daily.    [provider]  HYDROcodone-acetaminophen (NORCO/VICODIN) 5-325 MG tablet Take 1 tablet by mouth every 6 (six) hours as needed for severe pain. Patient not taking: Reported on 01/09/2020 11/18/19   Albrizze, Harley Hallmark, PA-C  meclizine (ANTIVERT) 25 MG tablet Take 1 tablet (25 mg total) by mouth 3 (three) times daily as needed for dizziness. 09/15/17   Drenda Freeze, MD  Multiple Vitamin (MULTIVITAMIN WITH MINERALS) TABS tablet Take 1 tablet by mouth daily.    [provider]  tiotropium (SPIRIVA) 18 MCG inhalation capsule Place 18 mcg into inhaler and inhale daily.    [provider]    Allergies    Adhesive [tape] and Latex  Review of Systems   Review of Systems  All other systems reviewed and are negative.   Physical Exam Updated Vital Signs BP (!) 168/105 (BP Location: Right Arm)   Pulse 65   Temp 98.5 F (36.9 C) (Oral)   Resp 14   SpO2 100%   Physical Exam Vitals and nursing note reviewed.  Constitutional:      General: She is not in acute distress.    Appearance: She is well-developed.     Comments: Patient is laying still in bed, wearing supplemental oxygen appears mildly uncomfortable  HENT:     Head: Normocephalic and atraumatic.  Eyes:     Conjunctiva/sclera: Conjunctivae normal.  Neck:     Comments: C-collar was initially in place, collar removed, patient does not have any significant cervical midline spine tenderness.  Neck with full range of motion without any pain. Cardiovascular:     Rate and Rhythm: Normal rate and regular rhythm.  Pulmonary:     Effort: Pulmonary effort is normal.     Breath sounds: Normal breath sounds.  Abdominal:     Palpations: Abdomen is soft.     Tenderness: There is no abdominal tenderness.  Musculoskeletal:        General: No tenderness (No tenderness of her extremities).     Cervical back: Normal range of motion and neck  supple.     Comments: At this time exam of her back is limited as patient refused to roll over due to pain.  Exquisite tenderness to the palpation of her left posterior back without obvious crepitus or emphysema however exam is limited.  Unable to palpate thoracic and lumbar spine due to her lack of willingness to roll over.  Skin:  Capillary Refill: Capillary refill takes less than 2 seconds.     Findings: No rash.  Neurological:     Mental Status: She is alert and oriented to person, place, and time.     Comments: Normal grip strength bilaterally, normal strength to bilateral lower extremities.  Psychiatric:        Mood and Affect: Mood normal.     ED Results / Procedures / Treatments   Labs (all labs ordered are listed, but only abnormal results are displayed) Labs Reviewed  I-STAT CHEM 8, ED - Abnormal; Notable for the following components:      Result Value   Glucose, Bld 102 (*)    All other components within normal limits  SARS CORONAVIRUS 2 (TAT 6-24 HRS)  CBC WITH DIFFERENTIAL/PLATELET  CK  URINALYSIS, ROUTINE W REFLEX MICROSCOPIC  CBC  BASIC METABOLIC PANEL    EKG None  Radiology CT Chest W Contrast  Result Date: 03/14/2020 CLINICAL DATA:  [place], left flank pain, left-sided rib fractures EXAM: CT CHEST, ABDOMEN, AND PELVIS WITH CONTRAST TECHNIQUE: Multidetector CT imaging of the chest, abdomen and pelvis was performed following the standard protocol during bolus administration of intravenous contrast. CONTRAST:  16mL OMNIPAQUE IOHEXOL 300 MG/ML  SOLN COMPARISON:  03/14/2020, 07/01/2012 FINDINGS: CT CHEST FINDINGS Cardiovascular: Heart and great vessels are unremarkable without pericardial effusion. There is moderate atherosclerosis throughout the aorta and coronary vessels. No evidence of vascular injury. Mediastinum/Nodes: No enlarged mediastinal, hilar, or axillary lymph nodes. Thyroid gland, trachea, and esophagus demonstrate no significant findings. Lungs/Pleura:  There is a trace left-sided pneumothorax anteriorly and at the apex, volume estimated less than 5%. Trace left pleural fluid. Dependent hypoventilatory changes of than the bilateral lower lobes. Mild upper lobe predominant emphysema. No acute airspace disease. Central airways are patent. Musculoskeletal: There are displaced left posterior fifth through seventh rib fractures. There is adjacent soft tissue edema and subcutaneous gas. No other acute displaced fractures. Reconstructed images demonstrate no additional findings. CT ABDOMEN PELVIS FINDINGS Hepatobiliary: No hepatic injury or perihepatic hematoma. Gallbladder is unremarkable Pancreas: Unremarkable. No pancreatic ductal dilatation or surrounding inflammatory changes. Spleen: No splenic injury or perisplenic hematoma. Adrenals/Urinary Tract: Adrenal glands are unremarkable. Kidneys are normal, without renal calculi, focal lesion, or hydronephrosis. Bladder is unremarkable. Stomach/Bowel: No bowel obstruction or ileus. No bowel wall thickening or inflammatory change. The appendix is surgically absent. Vascular/Lymphatic: Aortic atherosclerosis. No enlarged abdominal or pelvic lymph nodes. Reproductive: Status post hysterectomy. No adnexal masses. Other: No abdominal wall hernia or abnormality. No abdominopelvic ascites. Musculoskeletal: There are no acute displaced fractures. Multilevel thoracolumbar spondylosis is noted. Reconstructed images demonstrate no additional findings. IMPRESSION: 1. Trace left-sided pneumothorax volume estimated less than 5%. 2. Displaced left posterior fifth through seventh rib fractures, with surrounding soft tissue swelling and minimal soft tissue gas. 3. Trace left pleural effusion. 4. No acute intra-abdominal or intrapelvic trauma. Electronically Signed   By: Randa Ngo M.D.   On: 03/14/2020 19:36   CT ABDOMEN PELVIS W CONTRAST  Result Date: 03/14/2020 CLINICAL DATA:  [place], left flank pain, left-sided rib fractures EXAM:  CT CHEST, ABDOMEN, AND PELVIS WITH CONTRAST TECHNIQUE: Multidetector CT imaging of the chest, abdomen and pelvis was performed following the standard protocol during bolus administration of intravenous contrast. CONTRAST:  171mL OMNIPAQUE IOHEXOL 300 MG/ML  SOLN COMPARISON:  03/14/2020, 07/01/2012 FINDINGS: CT CHEST FINDINGS Cardiovascular: Heart and great vessels are unremarkable without pericardial effusion. There is moderate atherosclerosis throughout the aorta and coronary vessels. No evidence of vascular injury.  Mediastinum/Nodes: No enlarged mediastinal, hilar, or axillary lymph nodes. Thyroid gland, trachea, and esophagus demonstrate no significant findings. Lungs/Pleura: There is a trace left-sided pneumothorax anteriorly and at the apex, volume estimated less than 5%. Trace left pleural fluid. Dependent hypoventilatory changes of than the bilateral lower lobes. Mild upper lobe predominant emphysema. No acute airspace disease. Central airways are patent. Musculoskeletal: There are displaced left posterior fifth through seventh rib fractures. There is adjacent soft tissue edema and subcutaneous gas. No other acute displaced fractures. Reconstructed images demonstrate no additional findings. CT ABDOMEN PELVIS FINDINGS Hepatobiliary: No hepatic injury or perihepatic hematoma. Gallbladder is unremarkable Pancreas: Unremarkable. No pancreatic ductal dilatation or surrounding inflammatory changes. Spleen: No splenic injury or perisplenic hematoma. Adrenals/Urinary Tract: Adrenal glands are unremarkable. Kidneys are normal, without renal calculi, focal lesion, or hydronephrosis. Bladder is unremarkable. Stomach/Bowel: No bowel obstruction or ileus. No bowel wall thickening or inflammatory change. The appendix is surgically absent. Vascular/Lymphatic: Aortic atherosclerosis. No enlarged abdominal or pelvic lymph nodes. Reproductive: Status post hysterectomy. No adnexal masses. Other: No abdominal wall hernia or  abnormality. No abdominopelvic ascites. Musculoskeletal: There are no acute displaced fractures. Multilevel thoracolumbar spondylosis is noted. Reconstructed images demonstrate no additional findings. IMPRESSION: 1. Trace left-sided pneumothorax volume estimated less than 5%. 2. Displaced left posterior fifth through seventh rib fractures, with surrounding soft tissue swelling and minimal soft tissue gas. 3. Trace left pleural effusion. 4. No acute intra-abdominal or intrapelvic trauma. Electronically Signed   By: Randa Ngo M.D.   On: 03/14/2020 19:36   DG Chest Portable 1 View  Result Date: 03/14/2020 CLINICAL DATA:  [place], left-sided rib pain, emphysema, previous tobacco abuse EXAM: PORTABLE CHEST 1 VIEW COMPARISON:  09/15/2017 FINDINGS: Single frontal view of the chest demonstrates displaced posterior fifth through seventh rib fractures. No airspace disease, effusion, or pneumothorax. Cardiac silhouette is unremarkable. Stable atherosclerosis of the aortic arch. IMPRESSION: 1. Displaced fractures of the left fifth through seventh ribs in the posterior midclavicular line. 2. No effusion or pneumothorax.  No acute intrathoracic process. Electronically Signed   By: Randa Ngo M.D.   On: 03/14/2020 16:16   DG Abd Portable 1 View  Result Date: 03/14/2020 CLINICAL DATA:  [place] on left side, left-sided pain EXAM: PORTABLE ABDOMEN - 1 VIEW COMPARISON:  09/15/2017 FINDINGS: Supine frontal view of the abdomen and pelvis demonstrates an unremarkable bowel gas pattern. No masses or abnormal calcifications. No displaced fractures. Multilevel thoracolumbar spondylosis and facet hypertrophy, with stable mild right convex lumbar scoliosis. IMPRESSION: 1. Degenerative changes of the thoracolumbar spine. 2. No acute bony abnormalities. 3. Unremarkable gas pattern. Electronically Signed   By: Randa Ngo M.D.   On: 03/14/2020 16:17    Procedures .Critical Care Performed by: Domenic Moras, PA-C Authorized by:  Domenic Moras, PA-C   Critical care provider statement:    Critical care time (minutes):  32   Critical care was time spent personally by me on the following activities:  Discussions with consultants, evaluation of patient's response to treatment, examination of patient, ordering and performing treatments and interventions, ordering and review of laboratory studies, ordering and review of radiographic studies, pulse oximetry, re-evaluation of patient's condition, obtaining history from patient or surrogate and review of old charts   (including critical care time)  Medications Ordered in ED Medications  sodium chloride (PF) 0.9 % injection (has no administration in time range)  sodium chloride flush (NS) 0.9 % injection 3 mL (has no administration in time range)  sodium chloride flush (NS) 0.9 %  injection 3 mL (has no administration in time range)  0.9 %  sodium chloride infusion (has no administration in time range)  metoprolol tartrate (LOPRESSOR) injection 5 mg (has no administration in time range)  hydrALAZINE (APRESOLINE) injection 10 mg (has no administration in time range)  ondansetron (ZOFRAN-ODT) disintegrating tablet 4 mg (has no administration in time range)    Or  ondansetron (ZOFRAN) injection 4 mg (has no administration in time range)  docusate sodium (COLACE) capsule 100 mg (has no administration in time range)  HYDROmorphone (DILAUDID) injection 0.5 mg (has no administration in time range)  acetaminophen (TYLENOL) tablet 650 mg (has no administration in time range)  ibuprofen (ADVIL) tablet 800 mg (has no administration in time range)  methocarbamol (ROBAXIN) tablet 500 mg (has no administration in time range)  oxyCODONE (Oxy IR/ROXICODONE) immediate release tablet 5 mg (has no administration in time range)  gabapentin (NEURONTIN) capsule 100 mg (has no administration in time range)  morphine 4 MG/ML injection 4 mg (4 mg Intravenous Given 03/14/20 1615)  ondansetron (ZOFRAN)  injection 4 mg (4 mg Intravenous Given 03/14/20 1615)  HYDROmorphone (DILAUDID) injection 0.5 mg (0.5 mg Intravenous Given 03/14/20 1726)  iohexol (OMNIPAQUE) 300 MG/ML solution 100 mL (100 mLs Intravenous Contrast Given 03/14/20 1908)    ED Course  I have reviewed the triage vital signs and the nursing notes.  Pertinent labs & imaging results that were available during my care of the patient were reviewed by me and considered in my medical decision making (see chart for details).    MDM Rules/Calculators/A&P                      BP (!) 165/77   Pulse 75   Temp 98.5 F (36.9 C) (Oral)   Resp 12   SpO2 98%   Final Clinical Impression(s) / ED Diagnoses Final diagnoses:  Traumatic pneumothorax, initial encounter  Fracture of ribs, six, left, closed, initial encounter  Fall, initial encounter    Rx / DC Orders ED Discharge Orders    None     Patient fell while trying to turn off her smoke alarm using her cane prior to arrival.  It appears to be in a mechanical fall and she struck her back against a wooden chest on the way down.  She is having exquisite pain to her left upper back and having trouble breathing.  She is on 2 L of oxygen satting at 100%.  Concern for potential rib fracture.  Plan to obtain screening x-ray of her chest and abdomen and pelvis as well as chest CT scan.  7:42 PM Initial portable x-ray of the chest demonstrate displaced fracture of the left fifth through seventh ribs in the posterior mid clavicular line without effusion or pneumothorax.  CT of the chest abdomen pelvis was subsequently obtained demonstrate trace left-sided pneumothorax volume estimate less than 5%.  Displaced left posterior fifth through seventh rib fracture with surrounding soft tissue swelling and minimal soft tissues gas.  Trace left pleural effusion.  No acute intra-abdominal or intrapelvic trauma.  With this finding, will consult trauma surgery for admission.  It is likely that patient  may not require chest tube insertion.  7:47 PM Appreciate consultation from trauma surgeon Dr. Kae Heller who recommend pt to be admitted to trauma [facility] at [facility] for further care.  Pt and daughter notified.  Will facility transfer.  COVID-19 screening test ordered.  Care discussed with DR. Pfeiffer.   [facility] Andreas  Taylor was evaluated in Emergency Department on 03/14/2020 for the symptoms described in the history of present illness. She was evaluated in the context of the global COVID-19 pandemic, which necessitated consideration that the patient might be at risk for infection with the SARS-CoV-2 virus that causes COVID-19. Institutional protocols and algorithms that pertain to the evaluation of patients at risk for COVID-19 are in a state of rapid change based on information released by regulatory bodies including the CDC and federal and state organizations. These policies and algorithms were followed during the patient's care in the ED.    Domenic Moras, PA-C 03/14/20 Patrecia Pour    Charlesetta Shanks, MD 03/30/20 [phone]

## 2020-03-15 ENCOUNTER — Observation Stay (HOSPITAL_COMMUNITY): Payer: Medicare PPO

## 2020-03-15 LAB — SARS CORONAVIRUS 2 (TAT 6-24 HRS): SARS Coronavirus 2: NEGATIVE

## 2020-03-15 LAB — SURGICAL PCR SCREEN
MRSA, PCR: NEGATIVE
Staphylococcus aureus: NEGATIVE

## 2020-03-15 MED ORDER — ENOXAPARIN SODIUM 40 MG/0.4ML ~~LOC~~ SOLN
40.0000 mg | Freq: Every day | SUBCUTANEOUS | Status: DC
  Administered 2020-03-15 – 2020-03-17 (×3): 40 mg via SUBCUTANEOUS
  Filled 2020-03-15 (×3): qty 0.4
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
## 2020-03-15 NOTE — Progress Notes (Signed)
Patient ID: [name], female   DOB: 18-Jan-1932, 86 y.o.   MRN: [id number]       Subjective: States she is not having any pain right now because she is not moving, but just got out of bed to her chair.  Therapies said she had some pain with this.  Denies SOB.  Former smoker.  Supposed to use CPAP at night, but rarely does as she feels like it "smoothers" her.  No other complaints of pain.  ROS: See above, otherwise other systems negative  Objective: Vital signs in last 24 hours: Temp:  [97.6 F (36.4 C)-98.5 F (36.9 C)] 97.8 F (36.6 C) (03/29 0732) Pulse Rate:  [55-81] 62 (03/29 0732) Resp:  [11-20] 14 (03/29 0732) BP: (118-180)/(52-105) 165/58 (03/29 0732) SpO2:  [96 %-100 %] 96 % (03/29 0732) Weight:  [68.4 kg] 68.4 kg (03/28 2221) Last BM Date: 03/14/20  Intake/Output from previous day: 03/28 0701 - 03/29 0700 In: 3 [I.V.:3] Out: -  Intake/Output this shift: No intake/output data recorded.  PE: Gen: NAD HEENT: PERRL Neck: trachea midline Heart: regular Lungs: CTAB, moving air well, tender on chest wall as expected Abd: soft, NT, ND, +BS Ext: MAE, no edema Neuro: sensation normal throughout Psych: A&Ox3  Lab Results:  Recent Labs    03/14/20 1623 03/14/20 1723  WBC 9.4  --   HGB 14.5 14.3  HCT 43.6 42.0  PLT 162  --    BMET Recent Labs    03/14/20 1723  NA 140  K 4.1  CL 106  GLUCOSE 102*  BUN 15  CREATININE 0.60   PT/INR No results for input(s): LABPROT, INR in the last 72 hours. CMP     Component Value Date/Time   NA 140 03/14/2020 1723   K 4.1 03/14/2020 1723   CL 106 03/14/2020 1723   CO2 26 09/15/2017 1052   GLUCOSE 102 (H) 03/14/2020 1723   BUN 15 03/14/2020 1723   CREATININE 0.60 03/14/2020 1723   CALCIUM 8.9 09/15/2017 1052   PROT 5.7 (L) 04/22/2012 0606   ALBUMIN 3.4 (L) 04/22/2012 0606   AST 13 04/22/2012 0606   ALT 10 04/22/2012 0606   ALKPHOS 57 04/22/2012 0606   BILITOT 0.3 04/22/2012 0606   GFRNONAA >60 09/15/2017  1052   GFRAA >60 09/15/2017 1052   Lipase     Component Value Date/Time   LIPASE 15 11/01/2007 1635       Studies/Results: CT Chest W Contrast  Result Date: 03/14/2020 CLINICAL DATA:  [place], left flank pain, left-sided rib fractures EXAM: CT CHEST, ABDOMEN, AND PELVIS WITH CONTRAST TECHNIQUE: Multidetector CT imaging of the chest, abdomen and pelvis was performed following the standard protocol during bolus administration of intravenous contrast. CONTRAST:  173mL OMNIPAQUE IOHEXOL 300 MG/ML  SOLN COMPARISON:  03/14/2020, 07/01/2012 FINDINGS: CT CHEST FINDINGS Cardiovascular: Heart and great vessels are unremarkable without pericardial effusion. There is moderate atherosclerosis throughout the aorta and coronary vessels. No evidence of vascular injury. Mediastinum/Nodes: No enlarged mediastinal, hilar, or axillary lymph nodes. Thyroid gland, trachea, and esophagus demonstrate no significant findings. Lungs/Pleura: There is a trace left-sided pneumothorax anteriorly and at the apex, volume estimated less than 5%. Trace left pleural fluid. Dependent hypoventilatory changes of than the bilateral lower lobes. Mild upper lobe predominant emphysema. No acute airspace disease. Central airways are patent. Musculoskeletal: There are displaced left posterior fifth through seventh rib fractures. There is adjacent soft tissue edema and subcutaneous gas. No other acute displaced fractures. Reconstructed images demonstrate no  additional findings. CT ABDOMEN PELVIS FINDINGS Hepatobiliary: No hepatic injury or perihepatic hematoma. Gallbladder is unremarkable Pancreas: Unremarkable. No pancreatic ductal dilatation or surrounding inflammatory changes. Spleen: No splenic injury or perisplenic hematoma. Adrenals/Urinary Tract: Adrenal glands are unremarkable. Kidneys are normal, without renal calculi, focal lesion, or hydronephrosis. Bladder is unremarkable. Stomach/Bowel: No bowel obstruction or ileus. No bowel wall  thickening or inflammatory change. The appendix is surgically absent. Vascular/Lymphatic: Aortic atherosclerosis. No enlarged abdominal or pelvic lymph nodes. Reproductive: Status post hysterectomy. No adnexal masses. Other: No abdominal wall hernia or abnormality. No abdominopelvic ascites. Musculoskeletal: There are no acute displaced fractures. Multilevel thoracolumbar spondylosis is noted. Reconstructed images demonstrate no additional findings. IMPRESSION: 1. Trace left-sided pneumothorax volume estimated less than 5%. 2. Displaced left posterior fifth through seventh rib fractures, with surrounding soft tissue swelling and minimal soft tissue gas. 3. Trace left pleural effusion. 4. No acute intra-abdominal or intrapelvic trauma. Electronically Signed   By: Randa Ngo M.D.   On: 03/14/2020 19:36   CT ABDOMEN PELVIS W CONTRAST  Result Date: 03/14/2020 CLINICAL DATA:  [place], left flank pain, left-sided rib fractures EXAM: CT CHEST, ABDOMEN, AND PELVIS WITH CONTRAST TECHNIQUE: Multidetector CT imaging of the chest, abdomen and pelvis was performed following the standard protocol during bolus administration of intravenous contrast. CONTRAST:  129mL OMNIPAQUE IOHEXOL 300 MG/ML  SOLN COMPARISON:  03/14/2020, 07/01/2012 FINDINGS: CT CHEST FINDINGS Cardiovascular: Heart and great vessels are unremarkable without pericardial effusion. There is moderate atherosclerosis throughout the aorta and coronary vessels. No evidence of vascular injury. Mediastinum/Nodes: No enlarged mediastinal, hilar, or axillary lymph nodes. Thyroid gland, trachea, and esophagus demonstrate no significant findings. Lungs/Pleura: There is a trace left-sided pneumothorax anteriorly and at the apex, volume estimated less than 5%. Trace left pleural fluid. Dependent hypoventilatory changes of than the bilateral lower lobes. Mild upper lobe predominant emphysema. No acute airspace disease. Central airways are patent. Musculoskeletal: There are  displaced left posterior fifth through seventh rib fractures. There is adjacent soft tissue edema and subcutaneous gas. No other acute displaced fractures. Reconstructed images demonstrate no additional findings. CT ABDOMEN PELVIS FINDINGS Hepatobiliary: No hepatic injury or perihepatic hematoma. Gallbladder is unremarkable Pancreas: Unremarkable. No pancreatic ductal dilatation or surrounding inflammatory changes. Spleen: No splenic injury or perisplenic hematoma. Adrenals/Urinary Tract: Adrenal glands are unremarkable. Kidneys are normal, without renal calculi, focal lesion, or hydronephrosis. Bladder is unremarkable. Stomach/Bowel: No bowel obstruction or ileus. No bowel wall thickening or inflammatory change. The appendix is surgically absent. Vascular/Lymphatic: Aortic atherosclerosis. No enlarged abdominal or pelvic lymph nodes. Reproductive: Status post hysterectomy. No adnexal masses. Other: No abdominal wall hernia or abnormality. No abdominopelvic ascites. Musculoskeletal: There are no acute displaced fractures. Multilevel thoracolumbar spondylosis is noted. Reconstructed images demonstrate no additional findings. IMPRESSION: 1. Trace left-sided pneumothorax volume estimated less than 5%. 2. Displaced left posterior fifth through seventh rib fractures, with surrounding soft tissue swelling and minimal soft tissue gas. 3. Trace left pleural effusion. 4. No acute intra-abdominal or intrapelvic trauma. Electronically Signed   By: Randa Ngo M.D.   On: 03/14/2020 19:36   DG CHEST PORT 1 VIEW  Result Date: 03/15/2020 CLINICAL DATA:  86 year old female with history of pneumothorax. EXAM: PORTABLE CHEST 1 VIEW COMPARISON:  Chest x-ray 03/14/2020. FINDINGS: Multiple left-sided posterior rib fractures of the left fifth, sixth and seventh ribs. The left fifth and sixth ribs are widely displaced, while the seventh rib is minimally displaced. Previously noted small left-sided pneumothorax is no longer  confidently identified. Right lung is clear.  No pleural effusions. No evidence of pulmonary edema. Heart size is borderline enlarged. Upper mediastinal contours are within normal limits. IMPRESSION: 1. Displaced left-sided fifth through seventh posterior rib fractures again noted. Previously noted small left pneumothorax has resolved compared to the prior study. 2. Aortic atherosclerosis. Electronically Signed   By: Vinnie Langton M.D.   On: 03/15/2020 08:13   DG Chest Port 1 View  Result Date: 03/14/2020 CLINICAL DATA:  Pain in left shoulder and left upper back. EXAM: PORTABLE CHEST 1 VIEW COMPARISON:  CT from same day. FINDINGS: There is a small left-sided pneumothorax. There are multiple displaced left-sided rib fractures involving the posterior fifth through seventh ribs. There is atelectasis at the left lung base with a probable small left-sided pleural effusion. The heart size is normal. IMPRESSION: Displaced left-sided rib fractures with a small left-sided pneumothorax, increased in size since x-ray dated 03/14/2020 at 3:59 p.m. Electronically Signed   By: Constance Holster M.D.   On: 03/14/2020 20:09   DG Chest Portable 1 View  Result Date: 03/14/2020 CLINICAL DATA:  [place], left-sided rib pain, emphysema, previous tobacco abuse EXAM: PORTABLE CHEST 1 VIEW COMPARISON:  09/15/2017 FINDINGS: Single frontal view of the chest demonstrates displaced posterior fifth through seventh rib fractures. No airspace disease, effusion, or pneumothorax. Cardiac silhouette is unremarkable. Stable atherosclerosis of the aortic arch. IMPRESSION: 1. Displaced fractures of the left fifth through seventh ribs in the posterior midclavicular line. 2. No effusion or pneumothorax.  No acute intrathoracic process. Electronically Signed   By: Randa Ngo M.D.   On: 03/14/2020 16:16   DG Abd Portable 1 View  Result Date: 03/14/2020 CLINICAL DATA:  [place] on left side, left-sided pain EXAM: PORTABLE ABDOMEN - 1 VIEW  COMPARISON:  09/15/2017 FINDINGS: Supine frontal view of the abdomen and pelvis demonstrates an unremarkable bowel gas pattern. No masses or abnormal calcifications. No displaced fractures. Multilevel thoracolumbar spondylosis and facet hypertrophy, with stable mild right convex lumbar scoliosis. IMPRESSION: 1. Degenerative changes of the thoracolumbar spine. 2. No acute bony abnormalities. 3. Unremarkable gas pattern. Electronically Signed   By: Randa Ngo M.D.   On: 03/14/2020 16:17    Anti-infectives: Anti-infectives (From admission, onward)   None       Assessment/Plan Fall Left 5-7 rib fxs with small PTX - IS, pulm toilet, pain control, therapies, repeat CXR today shows resolution of PTX.  Repeat CXR in am to assure stability.  Cont O2 therapy for today OSA Emphysema - stopped smoking 6 yrs ago FEN - regular diet VTE - Lovenox ID - none   LOS: 0 days    Henreitta Cea , [facility] Surgery 03/15/2020, 10:15 AM Please see Amion for pager number during day hours 7:00am-4:30pm or 7:00am -11:30am on weekends

## 2020-03-15 NOTE — TOC Initial Note (Signed)
Transition of Care [facility]) - Initial/Assessment Note    Patient Details  Name: [name] MRN: [id number] Date of Birth: 04/22/1932  Transition of Care [facility]) CM/SW Contact:    Emeterio Reeve, [place] Phone [phone] 03/15/2020, 1:38 PM  Clinical Narrative:                  CSW met with patient beside. CSW introduced self and explained her role. CSW completed the SBIRT. Pt stated she may have a glass of wine 2x a year. Pt stated she did not have a problem with alcohol. CSW offered resources and the pt denied them.   Emeterio Reeve, Latanya Presser, [place] Licensed Holiday representative
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
## 2020-03-16 ENCOUNTER — Observation Stay (HOSPITAL_COMMUNITY): Payer: Medicare PPO

## 2020-03-16 MED ORDER — METHOCARBAMOL 500 MG PO TABS
500.0000 mg | ORAL_TABLET | Freq: Four times a day (QID) | ORAL | Status: DC
  Administered 2020-03-16 – 2020-03-17 (×4): 500 mg via ORAL
  Filled 2020-03-16 (×4): qty 1

## 2020-03-16 MED ORDER — ACETAMINOPHEN 500 MG PO TABS
1000.0000 mg | ORAL_TABLET | Freq: Four times a day (QID) | ORAL | Status: DC
  Administered 2020-03-16 – 2020-03-17 (×4): 1000 mg via ORAL
  Filled 2020-03-16 (×4): qty 2

## 2020-03-16 MED ORDER — OXYCODONE HCL 5 MG PO TABS
5.0000 mg | ORAL_TABLET | ORAL | Status: DC | PRN
  Administered 2020-03-16: 10 mg via ORAL
  Administered 2020-03-16: 5 mg via ORAL
  Administered 2020-03-17: 10 mg via ORAL
  Filled 2020-03-16: qty 2
  Filled 2020-03-16: qty 1
  Filled 2020-03-16: qty 2

## 2020-03-16 NOTE — TOC Transition Note (Signed)
Transition of Care [facility]) - CM/SW Discharge Note Marvetta Gibbons RN,BSN Transitions of Care Unit 4NP (non trauma) - RN Case Manager [phone]   Patient Details  Name: [name] MRN: [id number] Date of Birth: [birth year]  Transition of [facility]) CM/SW Contact:  Dawayne Patricia, RN Phone Number: 03/16/2020, 12:16 PM   Clinical Narrative:    Orders placed for HHPT/OT, possible d/c later today. Cm spoke with pt and daughter at the bedside. Pt lives alone, independent prior to admission, daughter available to assist 24/7 post discharge. Pt reports she has RW, BSC and shower chair at home, no DME needs noted. Discussed HH needs- pt open to services- list provided for [facility] choice Per CMS guidelines from medicare.gov website with star ratings (copy placed in shadow chart)- per pt and daughter they do not have a preference and would just like to use an agency with "high star ratings" - call made to [facility] with [facility] for [facility] referral (ratings 4 or above star per list) referral has been accepted for HHPT/OT needs. Pt will have transportation    Final next level of care: Home w Home Health Services Barriers to Discharge: No Barriers Identified   Patient Goals and CMS Choice Patient states their goals for this hospitalization and ongoing recovery are:: to be able to not hurt so bad CMS Medicare.gov Compare Post Acute Care list provided to:: Patient Choice offered to / list presented to : Patient  Discharge Placement               Home with [facility]        Discharge Plan and Services   Discharge Planning Services: CM Consult Post Acute Care Choice: Home Health          DME Arranged: N/A DME Agency: NA       HH Arranged: PT, OT HH Agency: [place] Date [place]: 03/16/20 Time [place]: 1215 Representative spoke with at [place]: [place] Interventions     Readmission Risk Interventions No flowsheet data  found.

## 2020-03-16 NOTE — Progress Notes (Signed)
Patient ID: REITHA ALECK, female   DOB: 02-Feb-1932, 86 y.o.   MRN: [id number]       Subjective: Patient c/o pain in her left foot near the 5th metatarsal.  States it has been hurting some since she fell.  Otherwise no SOB, feels like her pain is well controlled.  Eating ok.  ROS: See above, otherwise other systems negative  Objective: Vital signs in last 24 hours: Temp:  [97.7 F (36.5 C)-98.4 F (36.9 C)] 98.4 F (36.9 C) (03/30 0747) Pulse Rate:  [47-62] 47 (03/30 0747) Resp:  [10-16] 16 (03/30 0747) BP: (94-136)/(44-85) 136/52 (03/30 0747) SpO2:  [96 %-98 %] 97 % (03/30 0747) Last BM Date: 03/14/20  Intake/Output from previous day: 03/29 0701 - 03/30 0700 In: 643 [P.O.:640; I.V.:3] Out: -  Intake/Output this shift: No intake/output data recorded.  PE: GEN: NAD HEENT: PERRLA Neck: trachea midline Heart: sinus brady Lungs: CTAB, chest wall tenderness on left as expected, O2 dc and sats in high 90s Abd: soft, NT, ND Ext: pain with dorsiflexion over left 5th MTP joint.  No erythema, edema, or ecchymosis noted.  MAE Neuro: normal sensation Psych: A&Ox3  Lab Results:  Recent Labs    03/14/20 1623 03/14/20 1723  WBC 9.4  --   HGB 14.5 14.3  HCT 43.6 42.0  PLT 162  --    BMET Recent Labs    03/14/20 1723  NA 140  K 4.1  CL 106  GLUCOSE 102*  BUN 15  CREATININE 0.60   PT/INR No results for input(s): LABPROT, INR in the last 72 hours. CMP     Component Value Date/Time   NA 140 03/14/2020 1723   K 4.1 03/14/2020 1723   CL 106 03/14/2020 1723   CO2 26 09/15/2017 1052   GLUCOSE 102 (H) 03/14/2020 1723   BUN 15 03/14/2020 1723   CREATININE 0.60 03/14/2020 1723   CALCIUM 8.9 09/15/2017 1052   PROT 5.7 (L) 04/22/2012 0606   ALBUMIN 3.4 (L) 04/22/2012 0606   AST 13 04/22/2012 0606   ALT 10 04/22/2012 0606   ALKPHOS 57 04/22/2012 0606   BILITOT 0.3 04/22/2012 0606   GFRNONAA >60 09/15/2017 1052   GFRAA >60 09/15/2017 1052   Lipase     Component  Value Date/Time   LIPASE 15 11/01/2007 1635       Studies/Results: CT Chest W Contrast  Result Date: 03/14/2020 CLINICAL DATA:  [place], left flank pain, left-sided rib fractures EXAM: CT CHEST, ABDOMEN, AND PELVIS WITH CONTRAST TECHNIQUE: Multidetector CT imaging of the chest, abdomen and pelvis was performed following the standard protocol during bolus administration of intravenous contrast. CONTRAST:  155mL OMNIPAQUE IOHEXOL 300 MG/ML  SOLN COMPARISON:  03/14/2020, 07/01/2012 FINDINGS: CT CHEST FINDINGS Cardiovascular: Heart and great vessels are unremarkable without pericardial effusion. There is moderate atherosclerosis throughout the aorta and coronary vessels. No evidence of vascular injury. Mediastinum/Nodes: No enlarged mediastinal, hilar, or axillary lymph nodes. Thyroid gland, trachea, and esophagus demonstrate no significant findings. Lungs/Pleura: There is a trace left-sided pneumothorax anteriorly and at the apex, volume estimated less than 5%. Trace left pleural fluid. Dependent hypoventilatory changes of than the bilateral lower lobes. Mild upper lobe predominant emphysema. No acute airspace disease. Central airways are patent. Musculoskeletal: There are displaced left posterior fifth through seventh rib fractures. There is adjacent soft tissue edema and subcutaneous gas. No other acute displaced fractures. Reconstructed images demonstrate no additional findings. CT ABDOMEN PELVIS FINDINGS Hepatobiliary: No hepatic injury or perihepatic hematoma. Gallbladder is  unremarkable Pancreas: Unremarkable. No pancreatic ductal dilatation or surrounding inflammatory changes. Spleen: No splenic injury or perisplenic hematoma. Adrenals/Urinary Tract: Adrenal glands are unremarkable. Kidneys are normal, without renal calculi, focal lesion, or hydronephrosis. Bladder is unremarkable. Stomach/Bowel: No bowel obstruction or ileus. No bowel wall thickening or inflammatory change. The appendix is surgically  absent. Vascular/Lymphatic: Aortic atherosclerosis. No enlarged abdominal or pelvic lymph nodes. Reproductive: Status post hysterectomy. No adnexal masses. Other: No abdominal wall hernia or abnormality. No abdominopelvic ascites. Musculoskeletal: There are no acute displaced fractures. Multilevel thoracolumbar spondylosis is noted. Reconstructed images demonstrate no additional findings. IMPRESSION: 1. Trace left-sided pneumothorax volume estimated less than 5%. 2. Displaced left posterior fifth through seventh rib fractures, with surrounding soft tissue swelling and minimal soft tissue gas. 3. Trace left pleural effusion. 4. No acute intra-abdominal or intrapelvic trauma. Electronically Signed   By: Randa Ngo M.D.   On: 03/14/2020 19:36   CT ABDOMEN PELVIS W CONTRAST  Result Date: 03/14/2020 CLINICAL DATA:  [place], left flank pain, left-sided rib fractures EXAM: CT CHEST, ABDOMEN, AND PELVIS WITH CONTRAST TECHNIQUE: Multidetector CT imaging of the chest, abdomen and pelvis was performed following the standard protocol during bolus administration of intravenous contrast. CONTRAST:  142mL OMNIPAQUE IOHEXOL 300 MG/ML  SOLN COMPARISON:  03/14/2020, 07/01/2012 FINDINGS: CT CHEST FINDINGS Cardiovascular: Heart and great vessels are unremarkable without pericardial effusion. There is moderate atherosclerosis throughout the aorta and coronary vessels. No evidence of vascular injury. Mediastinum/Nodes: No enlarged mediastinal, hilar, or axillary lymph nodes. Thyroid gland, trachea, and esophagus demonstrate no significant findings. Lungs/Pleura: There is a trace left-sided pneumothorax anteriorly and at the apex, volume estimated less than 5%. Trace left pleural fluid. Dependent hypoventilatory changes of than the bilateral lower lobes. Mild upper lobe predominant emphysema. No acute airspace disease. Central airways are patent. Musculoskeletal: There are displaced left posterior fifth through seventh rib fractures.  There is adjacent soft tissue edema and subcutaneous gas. No other acute displaced fractures. Reconstructed images demonstrate no additional findings. CT ABDOMEN PELVIS FINDINGS Hepatobiliary: No hepatic injury or perihepatic hematoma. Gallbladder is unremarkable Pancreas: Unremarkable. No pancreatic ductal dilatation or surrounding inflammatory changes. Spleen: No splenic injury or perisplenic hematoma. Adrenals/Urinary Tract: Adrenal glands are unremarkable. Kidneys are normal, without renal calculi, focal lesion, or hydronephrosis. Bladder is unremarkable. Stomach/Bowel: No bowel obstruction or ileus. No bowel wall thickening or inflammatory change. The appendix is surgically absent. Vascular/Lymphatic: Aortic atherosclerosis. No enlarged abdominal or pelvic lymph nodes. Reproductive: Status post hysterectomy. No adnexal masses. Other: No abdominal wall hernia or abnormality. No abdominopelvic ascites. Musculoskeletal: There are no acute displaced fractures. Multilevel thoracolumbar spondylosis is noted. Reconstructed images demonstrate no additional findings. IMPRESSION: 1. Trace left-sided pneumothorax volume estimated less than 5%. 2. Displaced left posterior fifth through seventh rib fractures, with surrounding soft tissue swelling and minimal soft tissue gas. 3. Trace left pleural effusion. 4. No acute intra-abdominal or intrapelvic trauma. Electronically Signed   By: Randa Ngo M.D.   On: 03/14/2020 19:36   DG CHEST PORT 1 VIEW  Result Date: 03/15/2020 CLINICAL DATA:  86 year old female with history of pneumothorax. EXAM: PORTABLE CHEST 1 VIEW COMPARISON:  Chest x-ray 03/14/2020. FINDINGS: Multiple left-sided posterior rib fractures of the left fifth, sixth and seventh ribs. The left fifth and sixth ribs are widely displaced, while the seventh rib is minimally displaced. Previously noted small left-sided pneumothorax is no longer confidently identified. Right lung is clear. No pleural effusions. No  evidence of pulmonary edema. Heart size is borderline enlarged. Upper mediastinal  contours are within normal limits. IMPRESSION: 1. Displaced left-sided fifth through seventh posterior rib fractures again noted. Previously noted small left pneumothorax has resolved compared to the prior study. 2. Aortic atherosclerosis. Electronically Signed   By: Vinnie Langton M.D.   On: 03/15/2020 08:13   DG Chest Port 1 View  Result Date: 03/14/2020 CLINICAL DATA:  Pain in left shoulder and left upper back. EXAM: PORTABLE CHEST 1 VIEW COMPARISON:  CT from same day. FINDINGS: There is a small left-sided pneumothorax. There are multiple displaced left-sided rib fractures involving the posterior fifth through seventh ribs. There is atelectasis at the left lung base with a probable small left-sided pleural effusion. The heart size is normal. IMPRESSION: Displaced left-sided rib fractures with a small left-sided pneumothorax, increased in size since x-ray dated 03/14/2020 at 3:59 p.m. Electronically Signed   By: Constance Holster M.D.   On: 03/14/2020 20:09   DG Chest Portable 1 View  Result Date: 03/14/2020 CLINICAL DATA:  [place], left-sided rib pain, emphysema, previous tobacco abuse EXAM: PORTABLE CHEST 1 VIEW COMPARISON:  09/15/2017 FINDINGS: Single frontal view of the chest demonstrates displaced posterior fifth through seventh rib fractures. No airspace disease, effusion, or pneumothorax. Cardiac silhouette is unremarkable. Stable atherosclerosis of the aortic arch. IMPRESSION: 1. Displaced fractures of the left fifth through seventh ribs in the posterior midclavicular line. 2. No effusion or pneumothorax.  No acute intrathoracic process. Electronically Signed   By: Randa Ngo M.D.   On: 03/14/2020 16:16   DG Abd Portable 1 View  Result Date: 03/14/2020 CLINICAL DATA:  [place] on left side, left-sided pain EXAM: PORTABLE ABDOMEN - 1 VIEW COMPARISON:  09/15/2017 FINDINGS: Supine frontal view of the abdomen and  pelvis demonstrates an unremarkable bowel gas pattern. No masses or abnormal calcifications. No displaced fractures. Multilevel thoracolumbar spondylosis and facet hypertrophy, with stable mild right convex lumbar scoliosis. IMPRESSION: 1. Degenerative changes of the thoracolumbar spine. 2. No acute bony abnormalities. 3. Unremarkable gas pattern. Electronically Signed   By: Randa Ngo M.D.   On: 03/14/2020 16:17    Anti-infectives: Anti-infectives (From admission, onward)   None       Assessment/Plan Fall Left 5-7 rib fxs with small PTX - IS, pulm toilet, pain control, therapies, repeat CXR today stable.  DC o2.  Mobilize again with therapies prior to anticipatory DC today L foot pain - check film OSA Emphysema - stopped smoking 6 yrs ago FEN - regular diet VTE - Lovenox ID - none Dispo - await films, work with therapies, hopefully home today   LOS: 0 days    Henreitta Cea , [facility] Surgery 03/16/2020, 8:47 AM Please see Amion for pager number during day hours 7:00am-4:30pm or 7:00am -11:30am on weekends
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
## 2020-03-16 NOTE — Progress Notes (Signed)
   03/16/20 0747  MEWS Score  Pulse Rate (!) 47   Pt is resting in bed, HR sustaining in low 50s and high 40, paged Trauma per calling parameters related to low heart rate. She is asymptomatic, easily arousable. Will continue to monitor.
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
## 2020-03-17 MED ORDER — OXYCODONE HCL 5 MG PO TABS: 5.0000 mg | ORAL_TABLET | Freq: Four times a day (QID) | ORAL | 0 refills | Status: DC | PRN
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
## 2020-03-17 NOTE — Plan of Care (Signed)
  Problem: Health Behavior/Discharge Planning: Goal: Ability to manage health-related needs will improve Note: Received discharge order for patient. Discharge instructions reviewed with patient, including new medication/ prescriptions. At this time patient have stated understanding of discharge instructions and with no further questions at this time. Pt stable in no acute distress. PIV removed, intact. Pt off unit in wheelchair. Nursing care complete.

## 2020-03-17 NOTE — Discharge Instructions (Signed)
Rib Fracture  A rib fracture is a break or crack in one of the bones of the ribs. The ribs are like a cage that goes around your upper chest. A broken or cracked rib is often painful, but most do not cause other problems. Most rib fractures usually heal on their own in 1-3 months. Follow these instructions at home: Managing pain, stiffness, and swelling  If directed, apply ice to the injured area. ? Put ice in a plastic bag. ? Place a towel between your skin and the bag. ? Leave the ice on for 20 minutes, 2-3 times a day.  Take over-the-counter and prescription medicines only as told by your doctor. Activity  Avoid activities that cause pain to the injured area. Protect your injured area.  Slowly increase activity as told by your doctor. General instructions  Do deep breathing as told by your doctor. You may be told to: ? Take deep breaths many times a day. ? Cough many times a day while hugging a pillow. ? Use a device (incentive spirometer) to do deep breathing many times a day.  Drink enough fluid to keep your pee (urine) clear or pale yellow.  Do not wear a rib belt or binder. These do not allow you to breathe deeply.  Keep all follow-up visits as told by your doctor. This is important. Contact a doctor if:  You have a fever. Get help right away if:  You have trouble breathing.  You are short of breath.  You cannot stop coughing.  You cough up thick or bloody spit (sputum).  You feel sick to your stomach (nauseous), throw up (vomit), or have belly (abdominal) pain.  Your pain gets worse and medicine does not help. Summary  A rib fracture is a break or crack in one of the bones of the ribs.  Apply ice to the injured area and take medicines for pain as told by your doctor.  Take deep breaths and cough many times a day. Hug a pillow every time you cough. This information is not intended to replace advice given to you by your health care provider. Make sure you  discuss any questions you have with your health care provider. Document Revised: 11/16/2017 Document Reviewed: 03/06/2017 Elsevier Patient Education  2020 [employer]  
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
## 2020-05-24 DIAGNOSIS — Z9181 History of falling: Secondary | ICD-10-CM | POA: Diagnosis not present

## 2020-05-24 DIAGNOSIS — J449 Chronic obstructive pulmonary disease, unspecified: Secondary | ICD-10-CM | POA: Diagnosis not present

## 2020-05-24 DIAGNOSIS — R0989 Other specified symptoms and signs involving the circulatory and respiratory systems: Secondary | ICD-10-CM | POA: Diagnosis not present

## 2020-05-26 DIAGNOSIS — H00022 Hordeolum internum right lower eyelid: Secondary | ICD-10-CM | POA: Diagnosis not present

## 2020-06-03 DIAGNOSIS — L814 Other melanin hyperpigmentation: Secondary | ICD-10-CM | POA: Diagnosis not present

## 2020-06-03 DIAGNOSIS — Z85828 Personal history of other malignant neoplasm of skin: Secondary | ICD-10-CM | POA: Diagnosis not present

## 2020-06-03 DIAGNOSIS — D2272 Melanocytic nevi of left lower limb, including hip: Secondary | ICD-10-CM | POA: Diagnosis not present

## 2020-06-03 DIAGNOSIS — D1801 Hemangioma of skin and subcutaneous tissue: Secondary | ICD-10-CM | POA: Diagnosis not present

## 2020-06-03 DIAGNOSIS — D2271 Melanocytic nevi of right lower limb, including hip: Secondary | ICD-10-CM | POA: Diagnosis not present

## 2020-06-03 DIAGNOSIS — L821 Other seborrheic keratosis: Secondary | ICD-10-CM | POA: Diagnosis not present

## 2020-06-03 DIAGNOSIS — D692 Other nonthrombocytopenic purpura: Secondary | ICD-10-CM | POA: Diagnosis not present

## 2020-06-03 DIAGNOSIS — D225 Melanocytic nevi of trunk: Secondary | ICD-10-CM | POA: Diagnosis not present

## 2020-06-28 DIAGNOSIS — H10503 Unspecified blepharoconjunctivitis, bilateral: Secondary | ICD-10-CM | POA: Diagnosis not present

## 2020-06-29 ENCOUNTER — Emergency Department (HOSPITAL_COMMUNITY)
Admission: EM | Admit: 2020-06-29 | Discharge: 2020-06-29 | Disposition: A | Discharge status: Home / Self Care | Payer: Medicare PPO | Attending: Emergency Medicine | Admitting: Emergency Medicine

## 2020-06-29 ENCOUNTER — Other Ambulatory Visit: Payer: Self-pay

## 2020-06-29 ENCOUNTER — Emergency Department (HOSPITAL_COMMUNITY): Payer: Medicare PPO

## 2020-06-29 DIAGNOSIS — Y939 Activity, unspecified: Secondary | ICD-10-CM | POA: Insufficient documentation

## 2020-06-29 DIAGNOSIS — Z7951 Long term (current) use of inhaled steroids: Secondary | ICD-10-CM | POA: Diagnosis not present

## 2020-06-29 DIAGNOSIS — W010XXA Fall on same level from slipping, tripping and stumbling without subsequent striking against object, initial encounter: Secondary | ICD-10-CM

## 2020-06-29 DIAGNOSIS — S0003XA Contusion of scalp, initial encounter: Secondary | ICD-10-CM | POA: Insufficient documentation

## 2020-06-29 DIAGNOSIS — W19XXXA Unspecified fall, initial encounter: Secondary | ICD-10-CM | POA: Diagnosis not present

## 2020-06-29 DIAGNOSIS — S0990XA Unspecified injury of head, initial encounter: Secondary | ICD-10-CM | POA: Diagnosis present

## 2020-06-29 DIAGNOSIS — R52 Pain, unspecified: Secondary | ICD-10-CM | POA: Diagnosis not present

## 2020-06-29 DIAGNOSIS — J449 Chronic obstructive pulmonary disease, unspecified: Secondary | ICD-10-CM | POA: Diagnosis not present

## 2020-06-29 DIAGNOSIS — Y999 Unspecified external cause status: Secondary | ICD-10-CM | POA: Diagnosis not present

## 2020-06-29 DIAGNOSIS — Y92838 Other recreation area as the place of occurrence of the external cause: Secondary | ICD-10-CM | POA: Diagnosis not present

## 2020-06-29 DIAGNOSIS — W01198A Fall on same level from slipping, tripping and stumbling with subsequent striking against other object, initial encounter: Secondary | ICD-10-CM | POA: Insufficient documentation

## 2020-06-29 DIAGNOSIS — Z7982 Long term (current) use of aspirin: Secondary | ICD-10-CM | POA: Diagnosis not present

## 2020-06-29 DIAGNOSIS — Z87891 Personal history of nicotine dependence: Secondary | ICD-10-CM | POA: Diagnosis not present

## 2020-06-29 DIAGNOSIS — R519 Headache, unspecified: Secondary | ICD-10-CM | POA: Diagnosis not present

## 2020-06-29 MED ORDER — ACETAMINOPHEN 325 MG PO TABS
650.0000 mg | ORAL_TABLET | Freq: Once | ORAL | Status: AC
  Administered 2020-06-29: 650 mg via ORAL
  Filled 2020-06-29: qty 2

## 2020-06-29 NOTE — ED Notes (Signed)
Linus Salmons, daughter, [phone].

## 2020-06-29 NOTE — Discharge Instructions (Addendum)
It was our pleasure to provide your ER care today - we hope that you feel better.  Icepack to sore area.   Take acetaminophen as need.   Fall precautions.   Return to ER if worse, new symptoms, new or severe pain, persistent vomiting, or other concern.

## 2020-06-29 NOTE — ED Provider Notes (Signed)
[facility] DEPT Provider Note   CSN: [id number] Arrival date & time: 06/29/20  1614     History Chief Complaint  Patient presents with  . Fall    [name] is a 86 y.o. female.  Patient c/o slip and fall just pta today. Patient was in a dressing room at store, tried to balance, socks slipped, fell back and hit head. Large contusion to posterior scalp. Felt fine, asymptomatic prior to fall. No faintness or dizziness. No loc. Mild headache post contusion head. No change in speech or vision. No neck or back pain. No numbness/weakness. Skin intact. Denies any other pain or injury. No anticoag use.   The history is provided by the patient.  Fall Pertinent negatives include no chest pain, no abdominal pain and no shortness of breath.       Past Medical History:  Diagnosis Date  . Anxiety   . Back pain   . Depression   . Emphysema   . Esophageal spasm   . Lung nodule   . OSA (obstructive sleep apnea)   . Osteomalacia   . Vertigo   . Vitamin D deficiency     Patient Active Problem List   Diagnosis Date Noted  . Rib fractures 03/14/2020  . Vertigo 09/13/2017  . Abnormal finding on imaging 04/22/2012  . Dyslipidemia 04/22/2012  . Depression 04/22/2012  . Anxiety 04/22/2012  . Tobacco abuse 04/22/2012  . COPD (chronic obstructive pulmonary disease) ([place]) 04/22/2012  . Gait abnormality 04/22/2012    Past Surgical History:  Procedure Laterality Date  . APPENDECTOMY    . BREAST EXCISIONAL BIOPSY Left 1978  . CARPAL TUNNEL RELEASE    . ROTATOR CUFF REPAIR       OB History   No obstetric history on file.     Family History  Problem Relation Age of Onset  . Diabetes type II Other   . Hypertension Other   . Breast cancer Maternal Aunt   . Other Mother        died of natural causes at age 78  . Other Father        stroke or heart attack while in shower    Social History   Tobacco Use  . Smoking status: Former Smoker     Packs/day: 1.00    Types: Cigarettes    Quit date: 03/2016    Years since quitting: 4.2  . Smokeless tobacco: Never Used  Vaping Use  . Vaping Use: Never used  Substance Use Topics  . Alcohol use: No    Alcohol/week: 0.0 standard drinks  . Drug use: No    Home Medications Prior to Admission medications   Medication Sig Start Date End Date Taking? Authorizing Provider  acetaminophen (TYLENOL 8 HOUR) 650 MG CR tablet Take 650 mg by mouth 2 (two) times daily as needed for pain.     [provider]  albuterol (PROVENTIL HFA;VENTOLIN HFA) 108 (90 Base) MCG/ACT inhaler Inhale 2 puffs into the lungs every 6 (six) hours as needed for wheezing or shortness of breath.    [provider]  aspirin EC 81 MG tablet Take 81 mg by mouth daily.    [provider]  atorvastatin (LIPITOR) 10 MG tablet Take 10 mg by mouth at bedtime.    [provider]  buPROPion (WELLBUTRIN XL) 150 MG 24 hr tablet Take 150 mg by mouth daily.    [provider]  Cholecalciferol (VITAMIN D) 2000 units tablet Take  2,000 Units by mouth daily.     [provider]  escitalopram (LEXAPRO) 20 MG tablet Take 40 mg by mouth daily. 08/01/17   [provider]  fluticasone (FLONASE) 50 MCG/ACT nasal spray Place 2 sprays into both nostrils daily.    [provider]  meclizine (ANTIVERT) 25 MG tablet Take 1 tablet (25 mg total) by mouth 3 (three) times daily as needed for dizziness. Patient not taking: Reported on 03/14/2020 09/15/17   Drenda Freeze, MD  Multiple Vitamin (MULTIVITAMIN WITH MINERALS) TABS tablet Take 1 tablet by mouth daily.    [provider]  oxyCODONE (OXY IR/ROXICODONE) 5 MG immediate release tablet Take 1 tablet (5 mg total) by mouth every 6 (six) hours as needed for severe pain or breakthrough pain. 03/17/20   Saverio Danker, PA-C  Polyethyl Glycol-Propyl Glycol (SYSTANE) 0.4-0.3 % SOLN Place 1 drop into both eyes daily as needed (dry  eyes).    [provider]  tiotropium (SPIRIVA) 18 MCG inhalation capsule Place 18 mcg into inhaler and inhale daily.    [provider]    Allergies    Adhesive [tape] and Latex  Review of Systems   Review of Systems  Constitutional: Negative for fever.  HENT: Negative for nosebleeds.   Eyes: Negative for visual disturbance.  Respiratory: Negative for shortness of breath.   Cardiovascular: Negative for chest pain.  Gastrointestinal: Negative for abdominal pain, nausea and vomiting.  Genitourinary: Negative for flank pain.  Musculoskeletal: Negative for back pain and neck pain.  Skin: Negative for wound.  Neurological: Negative for weakness and numbness.  Hematological: Does not bruise/bleed easily.  Psychiatric/Behavioral: Negative for confusion.    Physical Exam Updated Vital Signs BP (!) 151/88 (BP Location: Right Arm)   Pulse 76   Temp 98.4 F (36.9 C) (Oral)   Resp 16   Ht 1.549 m (5\' 1" )   Wt 70.3 kg   SpO2 95%   BMI 29.29 kg/m   Physical Exam Vitals and nursing note reviewed.  Constitutional:      Appearance: Normal appearance. She is well-developed.  HENT:     Head:     Comments: Large contusion to posterior scalp.     Nose: Nose normal.     Mouth/Throat:     Mouth: Mucous membranes are moist.  Eyes:     General: No scleral icterus.    Conjunctiva/sclera: Conjunctivae normal.     Pupils: Pupils are equal, round, and reactive to light.  Neck:     Trachea: No tracheal deviation.  Cardiovascular:     Rate and Rhythm: Normal rate and regular rhythm.     Pulses: Normal pulses.     Heart sounds: Normal heart sounds. No murmur heard.  No friction rub. No gallop.   Pulmonary:     Effort: Pulmonary effort is normal. No respiratory distress.     Breath sounds: Normal breath sounds.  Abdominal:     General: Bowel sounds are normal. There is no distension.     Palpations: Abdomen is soft.     Tenderness: There is no abdominal tenderness.  There is no guarding.  Genitourinary:    Comments: No cva tenderness.  Musculoskeletal:        General: No swelling.     Cervical back: Normal range of motion and neck supple. No rigidity. No muscular tenderness.     Comments: CTLS spine, non tender, aligned, no step off. Good rom bil ext without pain or focal bony tenderness.  Skin:    General: Skin is warm and dry.     Findings: No rash.  Neurological:     Mental Status: She is alert.     Comments: Alert, speech normal. GCS 15. Motor/sens grossly intact bil.   Psychiatric:        Mood and Affect: Mood normal.     ED Results / Procedures / Treatments   Labs (all labs ordered are listed, but only abnormal results are displayed) Labs Reviewed - No data to display  EKG None  Radiology CT HEAD WO CONTRAST  Result Date: 06/29/2020 CLINICAL DATA:  Head trauma, headache EXAM: CT HEAD WITHOUT CONTRAST TECHNIQUE: Contiguous axial images were obtained from the base of the skull through the vertex without intravenous contrast. COMPARISON:  09/15/2017 FINDINGS: Brain: Normal anatomic configuration. Moderate periventricular white matter changes are present likely reflecting the sequela of small vessel ischemia. No abnormal intra or extra-axial mass lesion or fluid collection. No abnormal mass effect or midline shift. No evidence of acute intracranial hemorrhage or infarct. Ventricular size is normal. Cerebellum unremarkable. Vascular: Unremarkable Skull: Intact Sinuses/Orbits: Paranasal sinuses are clear. Orbits are unremarkable. Other: Small left parietal scalp hematoma is noted. Mastoid air cells and middle ear cavities are clear. IMPRESSION: Small scalp hematoma. No calvarial fracture. No acute intracranial injury. Electronically Signed   By: Fidela Salisbury MD   On: 06/29/2020 17:01    Procedures Procedures (including critical care time)  Medications Ordered in ED Medications - No data to display  ED Course  I have reviewed the triage  vital signs and the nursing notes.  Pertinent labs & imaging results that were available during my care of the patient were reviewed by me and considered in my medical decision making (see chart for details).    MDM Rules/Calculators/A&P                          Imaging ordered.   Reviewed nursing notes and prior charts for additional history.   CT reviewed/interpreted by me - no fx or hem.  C spine non tender. No neck pain.  Acetaminophen po.  Ambulates in ED, steady gait.  Pt appears stable for d/c.     Final Clinical Impression(s) / ED Diagnoses Final diagnoses:  None    Rx / DC Orders ED Discharge Orders    None       Lajean Saver, MD 06/29/20 1708

## 2020-06-29 NOTE — ED Triage Notes (Signed)
Pt arrived via EMS, per EMS, mechanical fall while changing clothes at store, no LOC, no blood thinners, no vision changes, some pain to back of head, hematoma, denies any other complaints.

## 2020-07-07 DIAGNOSIS — F331 Major depressive disorder, recurrent, moderate: Secondary | ICD-10-CM | POA: Diagnosis not present

## 2020-07-07 DIAGNOSIS — F410 Panic disorder [episodic paroxysmal anxiety] without agoraphobia: Secondary | ICD-10-CM | POA: Diagnosis not present

## 2020-07-12 ENCOUNTER — Other Ambulatory Visit: Payer: Self-pay | Admitting: Family Medicine

## 2020-07-12 DIAGNOSIS — N6489 Other specified disorders of breast: Secondary | ICD-10-CM

## 2020-07-12 DIAGNOSIS — Z1231 Encounter for screening mammogram for malignant neoplasm of breast: Secondary | ICD-10-CM

## 2020-07-15 DIAGNOSIS — R296 Repeated falls: Secondary | ICD-10-CM | POA: Diagnosis not present

## 2020-07-15 DIAGNOSIS — Z6829 Body mass index (BMI) 29.0-29.9, adult: Secondary | ICD-10-CM | POA: Diagnosis not present

## 2020-07-15 DIAGNOSIS — S0990XD Unspecified injury of head, subsequent encounter: Secondary | ICD-10-CM | POA: Diagnosis not present

## 2020-07-15 DIAGNOSIS — S2242XG Multiple fractures of ribs, left side, subsequent encounter for fracture with delayed healing: Secondary | ICD-10-CM | POA: Diagnosis not present

## 2020-07-15 DIAGNOSIS — F3341 Major depressive disorder, recurrent, in partial remission: Secondary | ICD-10-CM | POA: Diagnosis not present

## 2020-07-23 DIAGNOSIS — R296 Repeated falls: Secondary | ICD-10-CM | POA: Diagnosis not present

## 2020-07-23 DIAGNOSIS — J449 Chronic obstructive pulmonary disease, unspecified: Secondary | ICD-10-CM | POA: Diagnosis not present

## 2020-07-23 DIAGNOSIS — G4733 Obstructive sleep apnea (adult) (pediatric): Secondary | ICD-10-CM | POA: Diagnosis not present

## 2020-07-23 DIAGNOSIS — F3341 Major depressive disorder, recurrent, in partial remission: Secondary | ICD-10-CM | POA: Diagnosis not present

## 2020-07-23 DIAGNOSIS — J310 Chronic rhinitis: Secondary | ICD-10-CM | POA: Diagnosis not present

## 2020-07-23 DIAGNOSIS — M5441 Lumbago with sciatica, right side: Secondary | ICD-10-CM | POA: Diagnosis not present

## 2020-07-23 DIAGNOSIS — I739 Peripheral vascular disease, unspecified: Secondary | ICD-10-CM | POA: Diagnosis not present

## 2020-07-23 DIAGNOSIS — E559 Vitamin D deficiency, unspecified: Secondary | ICD-10-CM | POA: Diagnosis not present

## 2020-07-23 DIAGNOSIS — E782 Mixed hyperlipidemia: Secondary | ICD-10-CM | POA: Diagnosis not present

## 2020-07-27 ENCOUNTER — Other Ambulatory Visit: Payer: Self-pay | Admitting: Family Medicine

## 2020-07-27 DIAGNOSIS — N6489 Other specified disorders of breast: Secondary | ICD-10-CM

## 2020-07-28 DIAGNOSIS — H10503 Unspecified blepharoconjunctivitis, bilateral: Secondary | ICD-10-CM | POA: Diagnosis not present

## 2020-08-02 ENCOUNTER — Other Ambulatory Visit: Payer: Self-pay

## 2020-08-02 ENCOUNTER — Ambulatory Visit
Admission: RE | Admit: 2020-08-02 | Discharge: 2020-08-02 | Disposition: A | Discharge status: Home / Self Care | Payer: Medicare PPO | Source: Ambulatory Visit | Attending: Family Medicine | Admitting: Family Medicine

## 2020-08-02 ENCOUNTER — Other Ambulatory Visit: Payer: Self-pay | Admitting: Family Medicine

## 2020-08-02 DIAGNOSIS — N6489 Other specified disorders of breast: Secondary | ICD-10-CM | POA: Diagnosis not present

## 2020-08-02 DIAGNOSIS — R928 Other abnormal and inconclusive findings on diagnostic imaging of breast: Secondary | ICD-10-CM | POA: Diagnosis not present

## 2020-08-18 DIAGNOSIS — F331 Major depressive disorder, recurrent, moderate: Secondary | ICD-10-CM | POA: Diagnosis not present

## 2020-08-18 DIAGNOSIS — F410 Panic disorder [episodic paroxysmal anxiety] without agoraphobia: Secondary | ICD-10-CM | POA: Diagnosis not present

## 2020-08-31 ENCOUNTER — Ambulatory Visit
Admission: RE | Admit: 2020-08-31 | Discharge: 2020-08-31 | Disposition: A | Discharge status: Home / Self Care | Payer: Medicare PPO | Source: Ambulatory Visit | Attending: Family Medicine | Admitting: Family Medicine

## 2020-08-31 ENCOUNTER — Other Ambulatory Visit: Payer: Self-pay

## 2020-08-31 DIAGNOSIS — R928 Other abnormal and inconclusive findings on diagnostic imaging of breast: Secondary | ICD-10-CM | POA: Diagnosis not present

## 2020-08-31 DIAGNOSIS — Z170 Estrogen receptor positive status [ER+]: Secondary | ICD-10-CM | POA: Diagnosis not present

## 2020-08-31 DIAGNOSIS — C50311 Malignant neoplasm of lower-inner quadrant of right female breast: Secondary | ICD-10-CM | POA: Diagnosis not present

## 2020-08-31 DIAGNOSIS — N6489 Other specified disorders of breast: Secondary | ICD-10-CM

## 2020-08-31 HISTORY — PX: BREAST BIOPSY: SHX20

## 2020-09-03 ENCOUNTER — Encounter: Payer: Self-pay | Admitting: *Deleted

## 2020-09-03 DIAGNOSIS — Z170 Estrogen receptor positive status [ER+]: Secondary | ICD-10-CM

## 2020-09-03 DIAGNOSIS — C50211 Malignant neoplasm of upper-inner quadrant of right female breast: Secondary | ICD-10-CM

## 2020-09-07 NOTE — Progress Notes (Signed)
Radiation Oncology         [phone] ________________________________  Initial Outpatient Consultation  Name: [name] MRN: [id number]  Date: 09/08/2020  DOB: Apr 15, 1932  BH:ALPFXTK, Abigail Butts, MD  Jovita Kussmaul, MD   REFERRING PHYSICIAN: Jovita Kussmaul, MD  DIAGNOSIS:    ICD-10-CM   1. Carcinoma of lower-inner quadrant of right breast in female, estrogen receptor positive ([place])  C50.311    Z17.0      Cancer Staging Malignant neoplasm of upper-inner quadrant of right breast in female, estrogen receptor positive ([place]) Staging form: Breast, AJCC 8th Edition - Clinical stage from 08/31/2020: Stage IA (cT1b, cN0, cM0, G2, ER+, PR+, HER2-) - Signed by Truitt Merle, MD on 09/07/2020    CHIEF COMPLAINT: Here to discuss management of right breast cancer  HISTORY OF PRESENT ILLNESS::[name] is a 86 y.o. female who presented with breast abnormality on the following imaging: bilateral diagnostic mammogram on the date of 08/02/2020. Symptoms, if any, at that time, were: rash beneath right breast. Ultrasound of breast on 08/02/2020 revealed interval increase in size of an asymmetry in the slightly medial right breast. There was noted to be a possible subtle sonographic correlate at the 4:30 o'clock position 6 cm from the nipple.  Mass measured 9 mm on ultrasound.  Axilla negative on ultrasound.  Biopsy on date of 08/31/2020 showed invasive ductal carcinoma. ER status: 95%; PR status 95%, Her2 status negative; Grade 2.  Her father lived into his mid 56s and her mother lived into her late 47s.  She reports arthritic pain but is otherwise in quite good health.  She is here with her daughter today.  Her daughter appears very supportive.  The patient has received 2 Pfizer shots for Covid.  PREVIOUS RADIATION THERAPY: No  PAST MEDICAL HISTORY:  has a past medical history of Anxiety, Back pain, Breast cancer ([place]), Depression, Emphysema, Esophageal spasm, Lung nodule, OSA (obstructive sleep  apnea), Osteomalacia, Vertigo, and Vitamin D deficiency.    PAST SURGICAL HISTORY: Past Surgical History:  Procedure Laterality Date  . ABDOMINAL HYSTERECTOMY    . APPENDECTOMY    . BREAST EXCISIONAL BIOPSY Left 1978  . CARPAL TUNNEL RELEASE    . ROTATOR CUFF REPAIR      FAMILY HISTORY: family history includes Breast cancer in her maternal aunt and mother; Diabetes type II in her sister and another family member; Hypertension in an other family member; Other in her father and mother.  SOCIAL HISTORY:  reports that she quit smoking about 4 years ago. Her smoking use included cigarettes. She has a 56.00 pack-year smoking history. She has never used smokeless tobacco. She reports that she does not drink alcohol and does not use drugs.  ALLERGIES: Adhesive [tape] and Latex  MEDICATIONS:  Current Outpatient Medications  Medication Sig Dispense Refill  . acetaminophen (TYLENOL 8 HOUR) 650 MG CR tablet Take 650 mg by mouth 2 (two) times daily as needed for pain.     Marland Kitchen albuterol (PROVENTIL HFA;VENTOLIN HFA) 108 (90 Base) MCG/ACT inhaler Inhale 2 puffs into the lungs every 6 (six) hours as needed for wheezing or shortness of breath.    Marland Kitchen aspirin EC 81 MG tablet Take 81 mg by mouth daily.    Marland Kitchen atorvastatin (LIPITOR) 10 MG tablet Take 10 mg by mouth at bedtime.    Marland Kitchen buPROPion (WELLBUTRIN XL) 300 MG 24 hr tablet Take 300 mg by mouth daily.    . Cholecalciferol (VITAMIN D) 2000 units tablet Take 2,000  Units by mouth daily.     Marland Kitchen escitalopram (LEXAPRO) 20 MG tablet Take 40 mg by mouth daily.  2  . fluticasone (FLONASE) 50 MCG/ACT nasal spray Place 2 sprays into both nostrils daily.    . Multiple Vitamin (MULTIVITAMIN WITH MINERALS) TABS tablet Take 1 tablet by mouth daily.    Bertram Gala Glycol-Propyl Glycol (SYSTANE) 0.4-0.3 % SOLN Place 1 drop into both eyes daily as needed (dry eyes).    Marland Kitchen tiotropium (SPIRIVA) 18 MCG inhalation capsule Place 18 mcg into inhaler and inhale daily.     No current  facility-administered medications for this encounter.    REVIEW OF SYSTEMS: As above  PHYSICAL EXAM:  vitals were not taken for this visit.   General: Alert and oriented, in no acute distress Neurologic:  No obvious focalities. Speech is fluent. Coordination is intact. Psychiatric: Judgment and insight are intact. Affect is appropriate. Breasts: Notable for postbiopsy firmness in the right breast.  This area is somewhat tender and bruised.  Bilateral nipple inversion. No other palpable masses appreciated in the breasts or axillae bilaterally.   ECOG = 1  0 - Asymptomatic (Fully active, able to carry on all predisease activities without restriction)  1 - Symptomatic but completely ambulatory (Restricted in physically strenuous activity but ambulatory and able to carry out work of a light or sedentary nature. For example, light housework, office work)  2 - Symptomatic, <50% in bed during the day (Ambulatory and capable of all self care but unable to carry out any work activities. Up and about more than 50% of waking hours)  3 - Symptomatic, >50% in bed, but not bedbound (Capable of only limited self-care, confined to bed or chair 50% or more of waking hours)  4 - Bedbound (Completely disabled. Cannot carry on any self-care. Totally confined to bed or chair)  5 - Death   Santiago Glad MM, Creech RH, Tormey DC, et al. [phone]). "Toxicity and response criteria of the [facility]". Am. Evlyn Clines. Oncol. 5 (6): 649-55   LABORATORY DATA:  Lab Results  Component Value Date   WBC 7.9 09/08/2020   HGB 13.6 09/08/2020   HCT 41.6 09/08/2020   MCV 94.1 09/08/2020   PLT 164 09/08/2020   CMP     Component Value Date/Time   NA 138 09/08/2020 1233   K 4.1 09/08/2020 1233   CL 101 09/08/2020 1233   CO2 32 09/08/2020 1233   GLUCOSE 94 09/08/2020 1233   BUN 13 09/08/2020 1233   CREATININE 0.80 09/08/2020 1233   CALCIUM 9.7 09/08/2020 1233   PROT 6.9 09/08/2020 1233   ALBUMIN  3.9 09/08/2020 1233   AST 15 09/08/2020 1233   ALT 14 09/08/2020 1233   ALKPHOS 74 09/08/2020 1233   BILITOT 0.5 09/08/2020 1233   GFRNONAA >60 09/08/2020 1233   GFRAA >60 09/08/2020 1233         RADIOGRAPHY: MM CLIP PLACEMENT RIGHT  Result Date: 08/31/2020 CLINICAL DATA:  Status post stereotactic guided core biopsy of RIGHT breast asymmetry. EXAM: DIAGNOSTIC RIGHT MAMMOGRAM POST STEREOTACTIC BIOPSY COMPARISON:  Previous exam(s). FINDINGS: Mammographic images were obtained following stereotactic guided biopsy of asymmetry in the LOWER INNER QUADRANT of the RIGHT breast and placement of a coil shaped clip. The biopsy marking clip is in expected position at the site of biopsy. IMPRESSION: Appropriate positioning of the coil shaped biopsy marking clip at the site of biopsy in the LOWER INNER QUADRANT RIGHT breast. Final Assessment: Post Procedure Mammograms for  Marker Placement Electronically Signed   By: Norva Pavlov M.D.   On: 08/31/2020 10:45   MM RT BREAST BX W LOC DEV 1ST LESION IMAGE BX SPEC STEREO GUIDE  Addendum Date: 09/01/2020   ADDENDUM REPORT: 09/01/2020 11:32 ADDENDUM: Pathology revealed GRADE II INVASIVE DUCTAL CARCINOMA of the RIGHT breast, lower inner quadrant, (coil clip). This was found to be concordant by Dr. Norva Pavlov. Pathology results were discussed with the patient and her daughter, [name], by telephone. The patient reported doing well after the biopsy with tenderness at the site. Post biopsy instructions and care were reviewed and questions were answered. The patient was encouraged to call [facility] for any additional concerns. My direct phone number was provided. The patient was referred to [facility] on September 08, 2020. Pathology results reported by Rene Kocher, RN on 09/01/2020. Electronically Signed   By: Norva Pavlov M.D.   On: 09/01/2020  11:32   Result Date: 09/01/2020 CLINICAL DATA:  Patient presents for stereotactic guided core biopsy of asymmetry in the RIGHT breast. EXAM: RIGHT BREAST STEREOTACTIC CORE NEEDLE BIOPSY COMPARISON:  Previous exams. FINDINGS: Prior to procedure, true LATERAL images are performed, confirming presence of asymmetry in the LOWER INNER QUADRANT, posterior depth. There is associated distortion suspicious for malignancy. The patient and I discussed the procedure of stereotactic-guided biopsy including benefits and alternatives. We discussed the high likelihood of a successful procedure. We discussed the risks of the procedure including infection, bleeding, tissue injury, clip migration, and inadequate sampling. Informed written consent was given. The usual time out protocol was performed immediately prior to the procedure. Using sterile technique and 1% lidocaine and 1% lidocaine with epinephrine as local anesthetic, under stereotactic guidance, a 9 gauge vacuum assisted device was used to perform core needle biopsy of asymmetry in the LOWER INNER QUADRANT of the RIGHT breast using a MEDIAL to LATERAL approach. Lesion quadrant: LOWER INNER QUADRANT RIGHT breast At the conclusion of the procedure, coil shaped tissue marker clip was deployed into the biopsy cavity. Follow-up 2-view mammogram was performed and dictated separately. IMPRESSION: Stereotactic-guided biopsy of RIGHT breast asymmetry. No apparent complications. Electronically Signed: By: Norva Pavlov M.D. On: 08/31/2020 10:37      IMPRESSION/PLAN: Right Breast Cancer  She has been discussed at tumor board. She has been discussed at our multidisciplinary tumor board.  The consensus is that she would be a good candidate for breast conservation. She is enthusiastic about breast conservation.  For the patient's early stage favorable risk breast cancer, we had a thorough discussion about her options for adjuvant therapy. One option would be antiestrogen  therapy as discussed with medical oncology. She would take a pill for approximately 5 years.  The more aggressive option would be to pursue both modalities (but I told her I thought this is probably would be more therapy than necessary).   Of note, I discussed the data from the ONE[employer] al trial in the [name] Journal of Medicine. She understands that tamoxifen compared to radiation plus tamoxifen demonstrated no survival benefit among the women in this study. The women were 70 years or older with stage I estrogen receptor positive breast cancer. Extrapolating from this study, I told Ms. Voshell that her overall life expectancy should not be affected by adding radiotherapy to antiestrogen medication. She understands that the main benefit of radiotherapy would be a very small but measurable local control benefit (risk  of local recurrence to be lowered from ~9% --> ~2%).    We discussed the risks benefits and side effects of radiotherapy. She understands that the side effects would likely include some skin irritation and fatigue during the weeks of radiation. I would anticipate delivering approximately 3 weeks of radiotherapy: I believe her anatomy is conducive to hypofractionation.  After this discussion, the patient is comfortable with proceeding with antiestrogen therapy alone as her adjuvant therapy.  I think this is a good fit for her, and she will have a terrific chance of cure.  If for some reason she has additional risk factors that warrant reconsideration of radiotherapy after surgery, she will be referred back to me.  Otherwise, I wished her the very best.  Of note, the patient is interested in receiving her COVID-19 booster shot today.  I helped arrange that to take place at the cancer center this afternoon after her consultations in breast clinic.  On date of service, in total, I spent 50 minutes on this encounter. Patient was seen in person.    __________________________________________   Eppie Gibson, MD  This document serves as a record of services personally performed by Eppie Gibson, MD. It was created on his behalf by Clerance Lav, a trained medical scribe. The creation of this record is based on the scribe's personal observations and the provider's statements to them. This document has been checked and approved by the attending provider.

## 2020-09-08 ENCOUNTER — Encounter: Payer: Self-pay | Admitting: Radiation Oncology

## 2020-09-08 ENCOUNTER — Encounter: Payer: Self-pay | Admitting: General Practice

## 2020-09-08 ENCOUNTER — Encounter: Payer: Self-pay | Admitting: Hematology

## 2020-09-08 ENCOUNTER — Other Ambulatory Visit: Payer: Self-pay

## 2020-09-08 ENCOUNTER — Inpatient Hospital Stay: Payer: Medicare PPO

## 2020-09-08 ENCOUNTER — Inpatient Hospital Stay: Payer: Medicare PPO | Attending: Hematology | Admitting: Hematology

## 2020-09-08 ENCOUNTER — Encounter: Payer: Self-pay | Admitting: *Deleted

## 2020-09-08 ENCOUNTER — Inpatient Hospital Stay (HOSPITAL_BASED_OUTPATIENT_CLINIC_OR_DEPARTMENT_OTHER): Payer: Medicare PPO | Admitting: Genetic Counselor

## 2020-09-08 ENCOUNTER — Ambulatory Visit: Payer: Self-pay | Admitting: General Surgery

## 2020-09-08 ENCOUNTER — Ambulatory Visit
Admission: RE | Admit: 2020-09-08 | Discharge: 2020-09-08 | Disposition: A | Discharge status: Home / Self Care | Payer: Medicare PPO | Source: Ambulatory Visit | Attending: Radiation Oncology | Admitting: Radiation Oncology

## 2020-09-08 ENCOUNTER — Ambulatory Visit: Payer: Medicare PPO | Admitting: Physical Therapy

## 2020-09-08 VITALS — BP 152/65 | HR 72 | Temp 97.7°F | Resp 16 | Ht 61.0 in | Wt 157.2 lb

## 2020-09-08 DIAGNOSIS — Z90710 Acquired absence of both cervix and uterus: Secondary | ICD-10-CM | POA: Diagnosis not present

## 2020-09-08 DIAGNOSIS — Z7982 Long term (current) use of aspirin: Secondary | ICD-10-CM | POA: Diagnosis not present

## 2020-09-08 DIAGNOSIS — C50311 Malignant neoplasm of lower-inner quadrant of right female breast: Secondary | ICD-10-CM | POA: Diagnosis not present

## 2020-09-08 DIAGNOSIS — F419 Anxiety disorder, unspecified: Secondary | ICD-10-CM | POA: Insufficient documentation

## 2020-09-08 DIAGNOSIS — J449 Chronic obstructive pulmonary disease, unspecified: Secondary | ICD-10-CM | POA: Diagnosis not present

## 2020-09-08 DIAGNOSIS — Z87891 Personal history of nicotine dependence: Secondary | ICD-10-CM

## 2020-09-08 DIAGNOSIS — G4733 Obstructive sleep apnea (adult) (pediatric): Secondary | ICD-10-CM | POA: Insufficient documentation

## 2020-09-08 DIAGNOSIS — Z803 Family history of malignant neoplasm of breast: Secondary | ICD-10-CM | POA: Insufficient documentation

## 2020-09-08 DIAGNOSIS — Z170 Estrogen receptor positive status [ER+]: Secondary | ICD-10-CM | POA: Diagnosis not present

## 2020-09-08 DIAGNOSIS — Z79899 Other long term (current) drug therapy: Secondary | ICD-10-CM | POA: Insufficient documentation

## 2020-09-08 DIAGNOSIS — C50211 Malignant neoplasm of upper-inner quadrant of right female breast: Secondary | ICD-10-CM | POA: Diagnosis not present

## 2020-09-08 DIAGNOSIS — Z23 Encounter for immunization: Secondary | ICD-10-CM

## 2020-09-08 DIAGNOSIS — Z8249 Family history of ischemic heart disease and other diseases of the circulatory system: Secondary | ICD-10-CM

## 2020-09-08 DIAGNOSIS — Z833 Family history of diabetes mellitus: Secondary | ICD-10-CM | POA: Diagnosis not present

## 2020-09-08 DIAGNOSIS — F329 Major depressive disorder, single episode, unspecified: Secondary | ICD-10-CM

## 2020-09-08 LAB — CBC WITH DIFFERENTIAL (CANCER CENTER ONLY)
Abs Immature Granulocytes: 0.05 10*3/uL (ref 0.00–0.07)
Basophils Absolute: 0.1 10*3/uL (ref 0.0–0.1)
Basophils Relative: 1 %
Eosinophils Absolute: 0.3 10*3/uL (ref 0.0–0.5)
Eosinophils Relative: 3 %
HCT: 41.6 % (ref 36.0–46.0)
Hemoglobin: 13.6 g/dL (ref 12.0–15.0)
Immature Granulocytes: 1 %
Lymphocytes Relative: 16 %
Lymphs Abs: 1.2 10*3/uL (ref 0.7–4.0)
MCH: 30.8 pg (ref 26.0–34.0)
MCHC: 32.7 g/dL (ref 30.0–36.0)
MCV: 94.1 fL (ref 80.0–100.0)
Monocytes Absolute: 0.7 10*3/uL (ref 0.1–1.0)
Monocytes Relative: 9 %
Neutro Abs: 5.6 10*3/uL (ref 1.7–7.7)
Neutrophils Relative %: 70 %
Platelet Count: 164 10*3/uL (ref 150–400)
RBC: 4.42 MIL/uL (ref 3.87–5.11)
RDW: 13.1 % (ref 11.5–15.5)
WBC Count: 7.9 10*3/uL (ref 4.0–10.5)
nRBC: 0 % (ref 0.0–0.2)

## 2020-09-08 LAB — CMP (CANCER CENTER ONLY)
ALT: 14 U/L (ref 0–44)
AST: 15 U/L (ref 15–41)
Albumin: 3.9 g/dL (ref 3.5–5.0)
Alkaline Phosphatase: 74 U/L (ref 38–126)
Anion gap: 5 (ref 5–15)
BUN: 13 mg/dL (ref 8–23)
CO2: 32 mmol/L (ref 22–32)
Calcium: 9.7 mg/dL (ref 8.9–10.3)
Chloride: 101 mmol/L (ref 98–111)
Creatinine: 0.8 mg/dL (ref 0.44–1.00)
GFR, Est AFR Am: >60 mL/min (ref >60)
GFR, Estimated: >60 mL/min (ref >60)
Glucose, Bld: 94 mg/dL (ref 70–99)
Potassium: 4.1 mmol/L (ref 3.5–5.1)
Sodium: 138 mmol/L (ref 135–145)
Total Bilirubin: 0.5 mg/dL (ref 0.3–1.2)
Total Protein: 6.9 g/dL (ref 6.5–8.1)

## 2020-09-08 LAB — GENETIC SCREENING ORDER

## 2020-09-08 NOTE — Progress Notes (Signed)
[facility]   Telephone:(336) [phone] [phone]) [phone]   Clinic New Consult Note   [name], MD as PCP - General (Family Medicine) Pershing Proud, [name], [name], [name], [name], MD as Attending Physician (Radiation Oncology)  Date of Service:  09/08/2020   CHIEF COMPLAINTS/PURPOSE OF CONSULTATION:  Newly diagnosed Malignant neoplasm of upper-inner quadrant of right breast   Oncology History Overview Note  Cancer Staging Malignant neoplasm of upper-inner quadrant of right breast in female, estrogen receptor positive (HCC) Staging form: Breast, AJCC 8th Edition - Clinical stage from 08/31/2020: Stage IA (cT1b, cN0, cM0, G2, ER+, PR+, HER2-) - Signed by Malachy Mood, MD on 09/07/2020    Malignant neoplasm of upper-inner quadrant of right breast in female, estrogen receptor positive (HCC)  08/02/2020 Mammogram   IMPRESSION: Interval increase in size of an asymmetry in the slightly medial right breast. Patient was unable to tolerate an mL view today due to a rash underneath her breast. There is a possible subtle sonographic correlate at 4:30 o'clock 6 cm from the nipple. This measures 0.9 x 0.7 x 0.6 cm.   08/31/2020 Cancer Staging   Staging form: Breast, AJCC 8th Edition - Clinical stage from 08/31/2020: Stage IA (cT1b, cN0, cM0, G2, ER+, PR+, HER2-) - Signed by Malachy Mood, MD on 09/07/2020   08/31/2020 Initial Biopsy   Diagnosis Breast, right, needle core biopsy, LIQ, coil clip - INVASIVE DUCTAL CARCINOMA - SEE COMMENT Microscopic Comment Based on the biopsy, the carcinoma appears Nottingham grade 2 of 3, has foci of extracellular mucin and measures 1 cm in greatest linear extent. Prognostic markers (ER/PR/ki-67/HER2) are pending and will be reported in an  addendum. Dr. Rayetta Pigg reviewed the case and agrees with the above diagnosis. These results were called to [facility] on September 01, 2020.   08/31/2020 Receptors her2   ADDITIONAL INFORMATION: Her2 is NEGATIVE (0).   Results: IMMUNOHISTOCHEMICAL AND MORPHOMETRIC ANALYSIS PERFORMED MANUALLY Estrogen Receptor: 95%, POSITIVE, STRONG STAINING INTENSITY Progesterone Receptor: 95%, POSITIVE, STRONG STAINING INTENSITY Proliferation Marker Ki67: 10%   09/03/2020 Initial Diagnosis   Malignant neoplasm of upper-inner quadrant of right breast in female, estrogen receptor positive (HCC)      HISTORY OF PRESENTING ILLNESS:  [name] 86 y.o. female is a here because of newly diagnosed right breast cancer. The patient presents to the Breast clinic today accompanied by her daughter [name]. She notes she is hard of hearing.   She notes she has had stable right breast mass for years. This 2021 Mammogram should that right breast mass grew. She notes she has baseline inverted nipples. No other new breast or nipple changed. She is overall at baseline.   Today the patient notes she has baseline arthritis in her hands and back. She uses extended release Tylenol BID. She ambulates with walker. She did have falls in the past year, her worst in 02/2020 with rib fracture. She does less now to be more careful. She has not had DEXA in many years and has not been treated with bisphosphonate before. She has medical alert necklace.   Socially she lives alone and widowed. She notes she is overall independent at home and able to do chores and ADLs. She has 4 daughters who live in town and help as needed. She smoked from age 78 to  82 before she quit. She does not currently drink alcohol.  She has a PMHx of COPD, Esophageal spasms on baby aspirin and nitroglycerin. She also has heart murmur, PAD, and arthritic spine which previously requires injections. Her back pain is tolerable on Tylenol. She  has Sleep apnea and rarely uses her CPAP since her recent collapsed lung. She had left breast lumpectomy in her 65s after being on hormonal replacement injections. She has maternal aunt and sister with breast cancer. She had hysterectomy due to heavy bleeding at age 63. I reviewed her medication list with her. She has had intermittent depression over the years.    GYN HISTORY  Menarchal: 15 LMP: 38 after Partial Hysterectomy due to menorrhagia  Contraceptive: No HRT: Shots for 6 months after hysterectomy G4P4 first at age 36    REVIEW OF SYSTEMS:    Constitutional: Denies fevers, chills or abnormal night sweats Eyes: Denies blurriness of vision, double vision or watery eyes Ears, nose, mouth, throat, and face: Denies mucositis or sore throat Respiratory: Denies cough, dyspnea or wheezes Cardiovascular: Denies palpitation, chest discomfort or lower extremity swelling Gastrointestinal:  Denies nausea, heartburn or change in bowel habits Skin: Denies abnormal skin rashes MSK: (+) Arthritis in hands and back, ambulated with walker  Lymphatics: Denies new lymphadenopathy or easy bruising Neurological:Denies numbness, tingling or new weaknesses Behavioral/Psych: Mood is stable, no new changes  All other systems were reviewed with the patient and are negative.   MEDICAL HISTORY:  Past Medical History:  Diagnosis Date  . Anxiety   . Back pain   . Breast cancer ([place])   . Depression   . Emphysema   . Esophageal spasm   . Lung nodule   . OSA (obstructive sleep apnea)   . Osteomalacia   . Vertigo   . Vitamin D deficiency     SURGICAL HISTORY: Past Surgical History:  Procedure Laterality Date  . ABDOMINAL HYSTERECTOMY    . APPENDECTOMY    . BREAST EXCISIONAL BIOPSY Left 1978  . CARPAL TUNNEL RELEASE    . ROTATOR CUFF REPAIR      SOCIAL HISTORY: Social History   Socioeconomic History  . Marital status: Widowed    Spouse name: Not on file  . Number of children: 4  . Years  of education: Bachelors  . Highest education level: Not on file  Occupational History  . Occupation: Retired  Tobacco Use  . Smoking status: Former Smoker    Packs/day: 1.00    Years: 56.00    Pack years: 56.00    Types: Cigarettes    Quit date: 03/2016    Years since quitting: 4.4  . Smokeless tobacco: Never Used  Vaping Use  . Vaping Use: Never used  Substance and Sexual Activity  . Alcohol use: No    Alcohol/week: 0.0 standard drinks  . Drug use: No  . Sexual activity: Not Currently  Other Topics Concern  . Not on file  Social History Narrative   Lives at home alone.   Right-handed.   1.5 cups caffeine per day, occasional Diet Coke.   Social Determinants of Health   Financial Resource Strain:   . Difficulty of Paying Living Expenses: Not on file  Food Insecurity:   . Worried About Charity fundraiser in the Last Year: Not on file  . Ran Out of Food in the Last Year: Not on file  Transportation Needs:   . Lack of Transportation (Medical): Not on file  . Lack of Transportation (  Non-Medical): Not on file  Physical Activity:   . Days of Exercise per Week: Not on file  . Minutes of Exercise per Session: Not on file  Stress:   . Feeling of Stress : Not on file  Social Connections:   . Frequency of Communication with Friends and Family: Not on file  . Frequency of Social Gatherings with Friends and Family: Not on file  . Attends Religious Services: Not on file  . Active Member of Clubs or Organizations: Not on file  . Attends Archivist Meetings: Not on file  . Marital Status: Not on file  Intimate Partner Violence:   . Fear of Current or Ex-Partner: Not on file  . Emotionally Abused: Not on file  . Physically Abused: Not on file  . Sexually Abused: Not on file    FAMILY HISTORY: Family History  Problem Relation Age of Onset  . Diabetes type II Other   . Hypertension Other   . Breast cancer Maternal Aunt   . Other Mother        died of natural  causes at age 82  . Breast cancer Mother   . Other Father        stroke or heart attack while in shower  . Diabetes type II Sister     ALLERGIES:  is allergic to adhesive [tape] and latex.  MEDICATIONS:  Current Outpatient Medications  Medication Sig Dispense Refill  . buPROPion (WELLBUTRIN XL) 300 MG 24 hr tablet Take 300 mg by mouth daily.    Marland Kitchen acetaminophen (TYLENOL 8 HOUR) 650 MG CR tablet Take 650 mg by mouth 2 (two) times daily as needed for pain.     Marland Kitchen albuterol (PROVENTIL HFA;VENTOLIN HFA) 108 (90 Base) MCG/ACT inhaler Inhale 2 puffs into the lungs every 6 (six) hours as needed for wheezing or shortness of breath.    Marland Kitchen aspirin EC 81 MG tablet Take 81 mg by mouth daily.    Marland Kitchen atorvastatin (LIPITOR) 10 MG tablet Take 10 mg by mouth at bedtime.    . Cholecalciferol (VITAMIN D) 2000 units tablet Take 2,000 Units by mouth daily.     Marland Kitchen escitalopram (LEXAPRO) 20 MG tablet Take 40 mg by mouth daily.  2  . fluticasone (FLONASE) 50 MCG/ACT nasal spray Place 2 sprays into both nostrils daily.    . Multiple Vitamin (MULTIVITAMIN WITH MINERALS) TABS tablet Take 1 tablet by mouth daily.    Vladimir Faster Glycol-Propyl Glycol (SYSTANE) 0.4-0.3 % SOLN Place 1 drop into both eyes daily as needed (dry eyes).    Marland Kitchen tiotropium (SPIRIVA) 18 MCG inhalation capsule Place 18 mcg into inhaler and inhale daily.     No current facility-administered medications for this visit.    PHYSICAL EXAMINATION: ECOG PERFORMANCE STATUS: 2 - Symptomatic, <50% confined to bed  Vitals:   09/08/20 1309  BP: (!) 152/65  Pulse: 72  Resp: 16  Temp: 97.7 F (36.5 C)  SpO2: 96%   Filed Weights   09/08/20 1309  Weight: 157 lb 3.2 oz (71.3 kg)    GENERAL:alert, no distress and comfortable SKIN: skin color, texture, turgor are normal, no rashes or significant lesions EYES: normal, Conjunctiva are pink and non-injected, sclera clear  NECK: supple, thyroid normal size, non-tender, without nodularity LYMPH:  no  palpable lymphadenopathy in the cervical, axillary  LUNGS: clear to auscultation and percussion with normal breathing effort HEART: regular rate & rhythm and no murmurs and no lower extremity edema ABDOMEN:abdomen soft, non-tender and normal  bowel sounds Musculoskeletal:no cyanosis of digits and no clubbing  NEURO: alert & oriented x 3 with fluent speech, no focal motor/sensory deficits BREAST: (+) Baseline b/l nipple inversion (+) S/p left breast lumpectomy healed well (+) Skin ecchymosis of right breast at biopsy site with tenderness with mild hematoma.   LABORATORY DATA:  I have reviewed the data as listed CBC Latest Ref Rng & Units 09/08/2020 03/14/2020 03/14/2020  WBC 4.0 - 10.5 K/uL 7.9 - 9.4  Hemoglobin 12.0 - 15.0 g/dL 13.6 14.3 14.5  Hematocrit 36 - 46 % 41.6 42.0 43.6  Platelets 150 - 400 K/uL 164 - 162    CMP Latest Ref Rng & Units 09/08/2020 03/14/2020 09/15/2017  Glucose 70 - 99 mg/dL 94 102(H) 117(H)  BUN 8 - 23 mg/dL _0 Creatinine 0.44 - 1.00 mg/dL 0.80 0.60 0.73  Sodium 135 - 145 mmol/L 138 140 138  Potassium 3.5 - 5.1 mmol/L 4.1 4.1 3.6  Chloride 98 - 111 mmol/L 101 106 104  CO2 22 - 32 mmol/L 32 - 26  Calcium 8.9 - 10.3 mg/dL 9.7 - 8.9  Total Protein 6.5 - 8.1 g/dL 6.9 - -  Total Bilirubin 0.3 - 1.2 mg/dL 0.5 - -  Alkaline Phos 38 - 126 U/L 74 - -  AST 15 - 41 U/L 15 - -  ALT 0 - 44 U/L 14 - -     RADIOGRAPHIC STUDIES: I have personally reviewed the radiological images as listed and agreed with the findings in the report. MM CLIP PLACEMENT RIGHT  Result Date: 08/31/2020 CLINICAL DATA:  Status post stereotactic guided core biopsy of RIGHT breast asymmetry. EXAM: DIAGNOSTIC RIGHT MAMMOGRAM POST STEREOTACTIC BIOPSY COMPARISON:  Previous exam(s). FINDINGS: Mammographic images were obtained following stereotactic guided biopsy of asymmetry in the LOWER INNER QUADRANT of the RIGHT breast and placement of a coil shaped clip. The biopsy marking clip is in expected  position at the site of biopsy. IMPRESSION: Appropriate positioning of the coil shaped biopsy marking clip at the site of biopsy in the LOWER INNER QUADRANT RIGHT breast. Final Assessment: Post Procedure Mammograms for Marker Placement Electronically Signed   By: Nolon Nations M.D.   On: 08/31/2020 10:45   MM RT BREAST BX W LOC DEV 1ST LESION IMAGE BX SPEC STEREO GUIDE  Addendum Date: 09/01/2020   ADDENDUM REPORT: 09/01/2020 11:32 ADDENDUM: Pathology revealed GRADE II INVASIVE DUCTAL CARCINOMA of the RIGHT breast, lower inner quadrant, (coil clip). This was found to be concordant by Dr. Nolon Nations. Pathology results were discussed with the patient and her daughter, [name], by telephone. The patient reported doing well after the biopsy with tenderness at the site. Post biopsy instructions and care were reviewed and questions were answered. The patient was encouraged to call [facility] for any additional concerns. My direct phone number was provided. The patient was referred to [facility] on September 08, 2020. Pathology results reported by Terie Purser, RN on 09/01/2020. Electronically Signed   By: Nolon Nations M.D.   On: 09/01/2020 11:32   Result Date: 09/01/2020 CLINICAL DATA:  Patient presents for stereotactic guided core biopsy of asymmetry in the RIGHT breast. EXAM: RIGHT BREAST STEREOTACTIC CORE NEEDLE BIOPSY COMPARISON:  Previous exams. FINDINGS: Prior to procedure, true LATERAL images are performed, confirming presence of asymmetry in the LOWER INNER QUADRANT, posterior depth. There is associated distortion suspicious for malignancy. The patient and I discussed  the procedure of stereotactic-guided biopsy including benefits and alternatives. We discussed the high likelihood of a successful procedure. We discussed the risks of the procedure including infection, bleeding, tissue  injury, clip migration, and inadequate sampling. Informed written consent was given. The usual time out protocol was performed immediately prior to the procedure. Using sterile technique and 1% lidocaine and 1% lidocaine with epinephrine as local anesthetic, under stereotactic guidance, a 9 gauge vacuum assisted device was used to perform core needle biopsy of asymmetry in the LOWER INNER QUADRANT of the RIGHT breast using a MEDIAL to LATERAL approach. Lesion quadrant: LOWER INNER QUADRANT RIGHT breast At the conclusion of the procedure, coil shaped tissue marker clip was deployed into the biopsy cavity. Follow-up 2-view mammogram was performed and dictated separately. IMPRESSION: Stereotactic-guided biopsy of RIGHT breast asymmetry. No apparent complications. Electronically Signed: By: Nolon Nations M.D. On: 08/31/2020 10:37    ASSESSMENT & PLAN:  [name] is a 86 y.o. Caucasian female with a history of Anxiety/Depression, Osteomalacia, Vertigo, Emphysema.    1. Malignant neoplasm of upper-inner quadrant of right breast, Stage IA, c(T1bN0M0), ER+/PR+/HER2-, Grade II -We discussed her image findings and the biopsy results in great details. After years of stable right breast mass she was found to have a small tumor in her right breast 4:30 position and biopsy confirmed invasive ductal carcinoma. -Given the stage IA disease, she likely need a right lumpectomy. She is agreeable with that. She was seen by Dr. Marlou Starks today and likely will proceed with surgery soon. Due to her advanced age and small tumor, will not do SLN biopsy  -Given her age and small early stage breast cancer, I do not recommend chemotherapy, so Oncotype Dx test is not recommended.  -The risk of recurrence depends on the stage and biology of the tumor. She is early stage, with ER/PR positive and HER2 negative markers. I discussed this is the more common type of breast cancer and likely slow growing tumor.  -She was also seen by  radiation oncologist Dr. Isidore Moos today to discuss option of Adjuvant radiation after surgery. If she is going to take antiestrogen therapy, she probably can forego adjuvant radiation.   -Given the strong ER and PR expression in her postmenopausal status, significant arthritis, I recommend adjuvant endocrine therapy with Tamoxifen for a total of 5 years to reduce the risk of cancer recurrence. Potential benefits and side effects were discussed with patient and she is interested. If she does not tolerate I will stop it.  -We also discussed the breast cancer surveillance after her surgery. She will continue annual screening mammogram, self exam, and a routine office visit with lab and exam with [place]. -Her initial breast exam benign. Labs reviewed, CBC and CMP WNL.  -F/u after surgery or Radiation.    2. Recurrent Fall -She has had fall in the past year. Her worst fall was in 02/2020 which results in rib fracture with pneumothorax and interrupted her healing with another fall in 06/2020.  -She ambulates with walker all the time. She was very active at home with chores, but not much lately due to concern with another fall. She has medical alert necklace.  -She had DEXA years ago, I recommend a new baseline, she is agreeable.    3. Comorbidities:  COPD, Esophageal spasms, heart murmur, PAD, Arthritis, Depression -On Wellbutrin and Lexapro for longterm occasional depression -She has arthritis in her hands but mainly in her back. She has received injections before, now controlled on extended  release Tylenol BID.  -She is on CPAP, inhaler, Spiriva, Nitroglycerin, baby aspirin managed by her PCP and sees vascular surgeon.    4. Genetics  -Her Sister and maternal aunt had breast cancer. She is eligible for genetic testing. She is interested, will proceed with testing today.   PLAN:  -Proceed with genetic testing today  -DEXA in 1-2 months  -Proceed with surgery soon  -F/u after surgery or Radiation, she  will forgo radiation due to advanced age and favorable prognosis.   No orders of the defined types were placed in this encounter.   All questions were answered. The patient knows to call the clinic with any problems, questions or concerns. The total time spent in the appointment was 45 minutes.     Truitt Merle, MD 09/08/2020   I, Joslyn Devon, am acting as scribe for Truitt Merle, MD.   I have reviewed the above documentation for accuracy and completeness, and I agree with the above.

## 2020-09-08 NOTE — Progress Notes (Signed)
[place] Psychosocial Distress Screening Spiritual Care  Met with [name] and her daughter [name] in [facility] to introduce [facility] team/resources, reviewing distress screen per protocol.  The patient scored a 5 on the Psychosocial Distress Thermometer which indicates moderate distress. Also assessed for distress and other psychosocial needs.   ONCBCN DISTRESS SCREENING 09/08/2020  Screening Type Initial Screening  Distress experienced in past week (1-10) 5  Family Problem type Children  Emotional problem type Depression;Nervousness/Anxiety;Adjusting to illness;Adjusting to appearance changes  Spiritual/Religous concerns type Facing my mortality;Loss of sense of purpose  Information Concerns Type Lack of info about treatment  Physical Problem type Sleep/insomnia;Constipation/diarrhea;Skin dry/itchy  Referral to support programs Yes   [name] was very tired during this encounter, and her top concern was to get home to bed. She notes that she had been more distressed prior to [facility] than she felt at the end; meeting her team and learning the scope of her diagnosis and treatment plan helped reduce concerns about mortality and adjustment. She has four daughters, all of whom want to be involved in supporting her.   Follow up needed: No.  [name] and [name] are aware of ongoing [facility] team/programming availability for patients and caregivers, and they know to contact Team if needs or questions arise.  [place], [place], [facility] Pager [phone] Voicemail [phone]

## 2020-09-08 NOTE — Progress Notes (Signed)
REFERRING PROVIDER: Truitt Merle, MD [address] [place],  [place] [address]  PRIMARY PROVIDER:  Erick Oxendine Caraway, MD  PRIMARY REASON FOR VISIT:  1. Malignant neoplasm of upper-inner quadrant of right breast in female, estrogen receptor positive ([place])   2. Family history of breast cancer    I connected with [name] on 09/08/2020 at 3:15pm EDT by Webex video conference and verified that I am speaking with the correct person using two identifiers.   HISTORY OF PRESENT ILLNESS:   [name], a 86 y.o. female, was seen for a  cancer genetics consultation during breast multi-disciplinary clinic at the request of Dr. Burr Medico due to a personal and family history of breast cancer.  [name] presents to clinic today with her daughter, [name], to discuss the possibility of a hereditary predisposition to cancer, to discuss genetic testing, and to further clarify her future cancer risks, as well as potential cancer risks for family members.   In September 2021, at the age of 89, [name] was diagnosed with invasive ductal carcinoma of the right breast. The preliminary treatment plan includes lumpectomy, consideration for adjuvant radiation, and anti-estrogens.   CANCER HISTORY:  Oncology History Overview Note  Cancer Staging Malignant neoplasm of upper-inner quadrant of right breast in female, estrogen receptor positive ([place]) Staging form: Breast, AJCC 8th Edition - Clinical stage from 08/31/2020: Stage IA (cT1b, cN0, cM0, G2, ER+, PR+, HER2-) - Signed by Truitt Merle, MD on 09/07/2020    Malignant neoplasm of upper-inner quadrant of right breast in female, estrogen receptor positive ([place])  08/02/2020 Mammogram   IMPRESSION: Interval increase in size of an asymmetry in the slightly medial right breast. Patient was unable to tolerate an mL view today due to a rash underneath her breast. There is a possible subtle sonographic correlate at 4:30 o'clock 6 cm from the nipple. This  measures 0.9 x 0.7 x 0.6 cm.   08/31/2020 Cancer Staging   Staging form: Breast, AJCC 8th Edition - Clinical stage from 08/31/2020: Stage IA (cT1b, cN0, cM0, G2, ER+, PR+, HER2-) - Signed by Truitt Merle, MD on 09/07/2020   08/31/2020 Initial Biopsy   Diagnosis Breast, right, needle core biopsy, LIQ, coil clip - INVASIVE DUCTAL CARCINOMA - SEE COMMENT Microscopic Comment Based on the biopsy, the carcinoma appears Nottingham grade 2 of 3, has foci of extracellular mucin and measures 1 cm in greatest linear extent. Prognostic markers (ER/PR/ki-67/HER2) are pending and will be reported in an addendum. Dr. Jeannie Done reviewed the case and agrees with the above diagnosis. These results were called to [facility] on September 01, 2020.   08/31/2020 Receptors her2   ADDITIONAL INFORMATION: Her2 is NEGATIVE (0).   Results: IMMUNOHISTOCHEMICAL AND MORPHOMETRIC ANALYSIS PERFORMED MANUALLY Estrogen Receptor: 95%, POSITIVE, STRONG STAINING INTENSITY Progesterone Receptor: 95%, POSITIVE, STRONG STAINING INTENSITY Proliferation Marker Ki67: 10%   09/03/2020 Initial Diagnosis   Malignant neoplasm of upper-inner quadrant of right breast in female, estrogen receptor positive ([place])      RISK FACTORS:  Menarche was at age 71.  First live birth at age 89.  OCP use for approximately 0 years.  Ovaries intact: yes.  Hysterectomy: yes at age 69  Menopausal status: postmenopausal.  HRT use: 0.5 years. Colonoscopy: yes; unknown year. Mammogram within the last year: yes. Up to date with pelvic exams: n/a. Any excessive radiation exposure in the past: no  Past Medical History:  Diagnosis Date  . Anxiety   . Back pain   .  Depression   . Emphysema   . Esophageal spasm   . Lung nodule   . OSA (obstructive sleep apnea)   . Osteomalacia   . Vertigo   . Vitamin D deficiency     Past Surgical History:  Procedure Laterality Date  . ABDOMINAL HYSTERECTOMY    . APPENDECTOMY    .  BREAST EXCISIONAL BIOPSY Left 1978  . CARPAL TUNNEL RELEASE    . ROTATOR CUFF REPAIR      Social History   Socioeconomic History  . Marital status: Widowed    Spouse name: Not on file  . Number of children: 4  . Years of education: Bachelors  . Highest education level: Not on file  Occupational History  . Occupation: Retired  Tobacco Use  . Smoking status: Former Smoker    Packs/day: 1.00    Years: 56.00    Pack years: 56.00    Types: Cigarettes    Quit date: 03/2016    Years since quitting: 4.4  . Smokeless tobacco: Never Used  Vaping Use  . Vaping Use: Never used  Substance and Sexual Activity  . Alcohol use: No    Alcohol/week: 0.0 standard drinks  . Drug use: No  . Sexual activity: Not Currently  Other Topics Concern  . Not on file  Social History Narrative   Lives at home alone.   Right-handed.   1.5 cups caffeine per day, occasional Diet Coke.   Social Determinants of Health   Financial Resource Strain:   . Difficulty of Paying Living Expenses: Not on file  Food Insecurity:   . Worried About Charity fundraiser in the Last Year: Not on file  . Ran Out of Food in the Last Year: Not on file  Transportation Needs:   . Lack of Transportation (Medical): Not on file  . Lack of Transportation (Non-Medical): Not on file  Physical Activity:   . Days of Exercise per Week: Not on file  . Minutes of Exercise per Session: Not on file  Stress:   . Feeling of Stress : Not on file  Social Connections:   . Frequency of Communication with Friends and Family: Not on file  . Frequency of Social Gatherings with Friends and Family: Not on file  . Attends Religious Services: Not on file  . Active Member of Clubs or Organizations: Not on file  . Attends Archivist Meetings: Not on file  . Marital Status: Not on file    FAMILY HISTORY:  We obtained a detailed, 4-generation family history.  Significant diagnoses are listed below: Family History  Problem Relation  Age of Onset  . Breast cancer Maternal Aunt        dx early 49s  . Cancer Paternal Uncle        unknown type; dx early 22s    [name] has four daughters, all in their 46s, who do not have a history of cancer.  [name]'s mother passed away at 94 and did not have cancer.  [name]'s maternal aunt was diagnosed with breast cancer in her early 11s and passed away at 49.  No other maternal family history of cancer was reported.  [name]'s father passed away at age 58 and did not have cancer.  [name]'s paternal uncle was diagnosed with an unknown cancer in his early 34s and passed away in his 54s.  No other paternal family history of cancer was reported.   [name] is unaware of previous  family history of genetic testing for hereditary cancer risks. Patient's maternal ancestors are of [place] descent, and paternal ancestors are of [place] descent. There is no reported Ashkenazi Jewish ancestry. There is no known consanguinity.  GENETIC COUNSELING ASSESSMENT: [name] is a 86 y.o. female with a personal and family history of breast cancer which is not suggestive of a hereditary cancer syndrome and predisposition to cancer given the diagnoses of cancer in the family above the age of 21. We, therefore, discussed and recommended the following at today's visit.   DISCUSSION: We discussed that most cases of breast cancer are considered to be sporadic. Signs of sporadic cancer in a family include relatively few diagnoses of cancer in a family, cancer at an older age, and more common types of cancer. We discussed that 5 - 10% of cancer is hereditary, with most cases of hereditary breast cancer associated with mutations in BRCA1 and BRCA2.  There are other genes that can be associated with hereditary breast cancer syndromes.  Type of cancer risk and level of cancer risk are gene-specific.  We discussed that testing is beneficial for some people (those with many individuals with cancer in  multiple generations of a family, those with rare cancers, and those with cancer at a young age) for several reasons including knowing how to follow individuals after completing their treatment, identifying whether potential treatment options would be beneficial, and understanding if other family members could be at risk for cancer and allowing them to undergo genetic testing.    We discussed with [name] that the personal and family history of breast cancer does not meet insurance or NCCN criteria for genetic testing and, therefore, is not highly consistent with a familial hereditary cancer syndrome.  We feel she is at low risk to harbor a gene mutation associated with such a condition. Thus, we did not recommend any genetic testing, at this time, and recommended [name] continue to follow the cancer screening guidelines given by her primary healthcare provider.  Individuals in this family might be at some increased risk of developing cancer, over the general population risk, simply due to the family history of cancer.  We recommended women in this family have a yearly mammogram beginning at age 45, or 44 years younger than the earliest onset of cancer, an annual clinical breast exam, and perform monthly breast self-exams. Women in this family should also have a gynecological exam as recommended by their primary provider. All family members should be referred for colonoscopy starting at age 20.  PLAN: After considering the risks, benefits, and limitations in the context of her personal and family history of cancer, [name] will not proceed genetic testing today.  We encouraged [name] to remain in contact with cancer genetics regularly so that we can continuously update the family history and inform her of any changes in cancer genetics and testing that may be of benefit for this family.   [name]'s questions were answered to her satisfaction today. Our contact information was  provided should additional questions or concerns arise. Thank you for the referral and allowing [place] to share in the care of your patient.   Corky Blumstein M. Joette Catching, [place], [facility] Certified Film/video [name] (P) [phone]  The patient was seen for a total of 15 minutes in televideo genetic counseling.  This patient was discussed with Drs. Magrinat, Lindi Adie and/or Burr Medico who agrees with the above.   _______________________________________________________________________ For Office Staff:  Number of people involved in session: 1  Was an Intern/ student involved with case: no

## 2020-09-09 ENCOUNTER — Encounter: Payer: Self-pay | Admitting: Genetic Counselor

## 2020-09-09 ENCOUNTER — Other Ambulatory Visit: Payer: Self-pay | Admitting: General Surgery

## 2020-09-09 DIAGNOSIS — Z170 Estrogen receptor positive status [ER+]: Secondary | ICD-10-CM

## 2020-09-09 DIAGNOSIS — Z803 Family history of malignant neoplasm of breast: Secondary | ICD-10-CM | POA: Insufficient documentation

## 2020-09-10 ENCOUNTER — Telehealth: Payer: Self-pay | Admitting: Hematology

## 2020-09-10 NOTE — Telephone Encounter (Signed)
No 9/22 los

## 2020-09-16 ENCOUNTER — Telehealth: Payer: Self-pay | Admitting: *Deleted

## 2020-09-16 ENCOUNTER — Encounter: Payer: Self-pay | Admitting: *Deleted

## 2020-09-16 NOTE — Telephone Encounter (Signed)
Left vm regarding [facility] from 9.22.21. Contact information provided for questions or needs.

## 2020-09-21 NOTE — Progress Notes (Signed)
Nutrition  Patient identified by attending Breast Clinic on 09/08/20.  Patient was given nutrition packet by nurse navigator with RD contact information.   Chart reviewed.   86 year old female with right breast cancer.  Planning lumpectomy and antiestrogens.    Ht: 61 inches Wt: 157 lb  BMI: 29 Stable weight  Patient is currently not at nutritional risk.  Please consult RD if nutritional status changes.    Vitoria Conyer B. Zenia Resides, [place], [place] Registered Dietitian [phone] (mobile)

## 2020-09-23 NOTE — Progress Notes (Signed)
Called patient to do pre op call, she was concerned with late surgery time of 1600 on 09/30/2020. Advised that she would be able to have clear liquids until 10am, but she wanted to see if an earlier time would be available. Call Hassan Rowan at Dr. Gwendolyn Fill office, they will reach out to her to offer an earlier time on Oct. 25th.

## 2020-09-24 ENCOUNTER — Encounter (HOSPITAL_BASED_OUTPATIENT_CLINIC_OR_DEPARTMENT_OTHER): Payer: Self-pay | Admitting: General Surgery

## 2020-09-24 ENCOUNTER — Other Ambulatory Visit: Payer: Self-pay

## 2020-09-27 ENCOUNTER — Other Ambulatory Visit (HOSPITAL_COMMUNITY)
Admission: RE | Admit: 2020-09-27 | Discharge: 2020-09-27 | Disposition: A | Discharge status: Home / Self Care | Payer: Medicare PPO | Source: Ambulatory Visit | Attending: General Surgery | Admitting: General Surgery

## 2020-09-27 DIAGNOSIS — Z20822 Contact with and (suspected) exposure to covid-19: Secondary | ICD-10-CM | POA: Diagnosis not present

## 2020-09-27 DIAGNOSIS — Z01812 Encounter for preprocedural laboratory examination: Secondary | ICD-10-CM | POA: Diagnosis not present

## 2020-09-27 LAB — SARS CORONAVIRUS 2 (TAT 6-24 HRS): SARS Coronavirus 2: NEGATIVE

## 2020-09-29 ENCOUNTER — Other Ambulatory Visit: Payer: Self-pay

## 2020-09-29 ENCOUNTER — Ambulatory Visit
Admission: RE | Admit: 2020-09-29 | Discharge: 2020-09-29 | Disposition: A | Discharge status: Home / Self Care | Payer: Medicare PPO | Source: Ambulatory Visit | Attending: General Surgery | Admitting: General Surgery

## 2020-09-29 DIAGNOSIS — C50911 Malignant neoplasm of unspecified site of right female breast: Secondary | ICD-10-CM | POA: Diagnosis not present

## 2020-09-29 DIAGNOSIS — Z170 Estrogen receptor positive status [ER+]: Secondary | ICD-10-CM

## 2020-09-30 ENCOUNTER — Ambulatory Visit (HOSPITAL_BASED_OUTPATIENT_CLINIC_OR_DEPARTMENT_OTHER): Payer: Medicare PPO | Admitting: Anesthesiology

## 2020-09-30 ENCOUNTER — Ambulatory Visit
Admission: RE | Admit: 2020-09-30 | Discharge: 2020-09-30 | Disposition: A | Discharge status: Home / Self Care | Payer: Medicare PPO | Source: Ambulatory Visit | Attending: General Surgery | Admitting: General Surgery

## 2020-09-30 ENCOUNTER — Encounter (HOSPITAL_BASED_OUTPATIENT_CLINIC_OR_DEPARTMENT_OTHER): Payer: Self-pay | Admitting: General Surgery

## 2020-09-30 ENCOUNTER — Ambulatory Visit (HOSPITAL_BASED_OUTPATIENT_CLINIC_OR_DEPARTMENT_OTHER)
Admission: RE | Admit: 2020-09-30 | Discharge: 2020-09-30 | Disposition: A | Discharge status: Home / Self Care | Payer: Medicare PPO | Attending: General Surgery | Admitting: General Surgery

## 2020-09-30 ENCOUNTER — Encounter (HOSPITAL_BASED_OUTPATIENT_CLINIC_OR_DEPARTMENT_OTHER)
Admission: RE | Disposition: A | Discharge status: Home / Self Care | Payer: Self-pay | Source: Home / Self Care | Attending: General Surgery

## 2020-09-30 ENCOUNTER — Encounter: Payer: Self-pay | Admitting: *Deleted

## 2020-09-30 DIAGNOSIS — Z8349 Family history of other endocrine, nutritional and metabolic diseases: Secondary | ICD-10-CM | POA: Insufficient documentation

## 2020-09-30 DIAGNOSIS — Z170 Estrogen receptor positive status [ER+]: Secondary | ICD-10-CM | POA: Insufficient documentation

## 2020-09-30 DIAGNOSIS — Z90710 Acquired absence of both cervix and uterus: Secondary | ICD-10-CM | POA: Diagnosis not present

## 2020-09-30 DIAGNOSIS — M1990 Unspecified osteoarthritis, unspecified site: Secondary | ICD-10-CM | POA: Diagnosis not present

## 2020-09-30 DIAGNOSIS — C50211 Malignant neoplasm of upper-inner quadrant of right female breast: Secondary | ICD-10-CM | POA: Diagnosis not present

## 2020-09-30 DIAGNOSIS — C50311 Malignant neoplasm of lower-inner quadrant of right female breast: Secondary | ICD-10-CM | POA: Insufficient documentation

## 2020-09-30 DIAGNOSIS — Z8249 Family history of ischemic heart disease and other diseases of the circulatory system: Secondary | ICD-10-CM | POA: Insufficient documentation

## 2020-09-30 DIAGNOSIS — M549 Dorsalgia, unspecified: Secondary | ICD-10-CM | POA: Diagnosis not present

## 2020-09-30 DIAGNOSIS — Z820 Family history of epilepsy and other diseases of the nervous system: Secondary | ICD-10-CM | POA: Diagnosis not present

## 2020-09-30 DIAGNOSIS — Z87891 Personal history of nicotine dependence: Secondary | ICD-10-CM | POA: Insufficient documentation

## 2020-09-30 DIAGNOSIS — R011 Cardiac murmur, unspecified: Secondary | ICD-10-CM | POA: Diagnosis not present

## 2020-09-30 DIAGNOSIS — Z818 Family history of other mental and behavioral disorders: Secondary | ICD-10-CM | POA: Insufficient documentation

## 2020-09-30 DIAGNOSIS — J439 Emphysema, unspecified: Secondary | ICD-10-CM | POA: Insufficient documentation

## 2020-09-30 DIAGNOSIS — F419 Anxiety disorder, unspecified: Secondary | ICD-10-CM | POA: Insufficient documentation

## 2020-09-30 DIAGNOSIS — Z8261 Family history of arthritis: Secondary | ICD-10-CM | POA: Insufficient documentation

## 2020-09-30 DIAGNOSIS — E119 Type 2 diabetes mellitus without complications: Secondary | ICD-10-CM | POA: Diagnosis not present

## 2020-09-30 DIAGNOSIS — F32A Depression, unspecified: Secondary | ICD-10-CM | POA: Diagnosis not present

## 2020-09-30 DIAGNOSIS — E785 Hyperlipidemia, unspecified: Secondary | ICD-10-CM | POA: Diagnosis not present

## 2020-09-30 DIAGNOSIS — Z833 Family history of diabetes mellitus: Secondary | ICD-10-CM | POA: Insufficient documentation

## 2020-09-30 DIAGNOSIS — G4730 Sleep apnea, unspecified: Secondary | ICD-10-CM | POA: Insufficient documentation

## 2020-09-30 DIAGNOSIS — Z171 Estrogen receptor negative status [ER-]: Secondary | ICD-10-CM | POA: Diagnosis not present

## 2020-09-30 DIAGNOSIS — C50911 Malignant neoplasm of unspecified site of right female breast: Secondary | ICD-10-CM | POA: Diagnosis not present

## 2020-09-30 HISTORY — DX: Malignant (primary) neoplasm, unspecified: C80.1

## 2020-09-30 HISTORY — PX: BREAST LUMPECTOMY WITH RADIOACTIVE SEED LOCALIZATION: SHX6424

## 2020-09-30 HISTORY — PX: BREAST LUMPECTOMY: SHX2

## 2020-09-30 SURGERY — BREAST LUMPECTOMY WITH RADIOACTIVE SEED LOCALIZATION
Anesthesia: General | Site: Breast | Laterality: Right

## 2020-09-30 MED ORDER — GABAPENTIN 300 MG PO CAPS
ORAL_CAPSULE | ORAL | Status: AC
  Filled 2020-09-30: qty 1

## 2020-09-30 MED ORDER — FENTANYL CITRATE (PF) 100 MCG/2ML IJ SOLN
INTRAMUSCULAR | Status: AC
  Filled 2020-09-30: qty 2

## 2020-09-30 MED ORDER — ONDANSETRON HCL 4 MG/2ML IJ SOLN
INTRAMUSCULAR | Status: AC
  Filled 2020-09-30: qty 2

## 2020-09-30 MED ORDER — ONDANSETRON HCL 4 MG/2ML IJ SOLN: 4.0000 mg | Freq: Once | INTRAMUSCULAR | Status: DC | PRN

## 2020-09-30 MED ORDER — CHLORHEXIDINE GLUCONATE CLOTH 2 % EX PADS: 6.0000 | MEDICATED_PAD | Freq: Once | CUTANEOUS | Status: DC

## 2020-09-30 MED ORDER — CEFAZOLIN SODIUM-DEXTROSE 2-4 GM/100ML-% IV SOLN
2.0000 g | INTRAVENOUS | Status: AC
  Administered 2020-09-30: 2 g via INTRAVENOUS

## 2020-09-30 MED ORDER — LIDOCAINE 2% (20 MG/ML) 5 ML SYRINGE
INTRAMUSCULAR | Status: AC
  Filled 2020-09-30: qty 5

## 2020-09-30 MED ORDER — LIDOCAINE HCL (CARDIAC) PF 100 MG/5ML IV SOSY
PREFILLED_SYRINGE | INTRAVENOUS | Status: DC | PRN
  Administered 2020-09-30: 60 mg via INTRATRACHEAL

## 2020-09-30 MED ORDER — ACETAMINOPHEN 500 MG PO TABS
ORAL_TABLET | ORAL | Status: AC
  Filled 2020-09-30: qty 2

## 2020-09-30 MED ORDER — CELECOXIB 200 MG PO CAPS
ORAL_CAPSULE | ORAL | Status: AC
  Filled 2020-09-30: qty 1

## 2020-09-30 MED ORDER — ONDANSETRON HCL 4 MG/2ML IJ SOLN
INTRAMUSCULAR | Status: DC | PRN
  Administered 2020-09-30: 4 mg via INTRAVENOUS

## 2020-09-30 MED ORDER — FENTANYL CITRATE (PF) 100 MCG/2ML IJ SOLN
INTRAMUSCULAR | Status: DC | PRN
  Administered 2020-09-30: 25 ug via INTRAVENOUS

## 2020-09-30 MED ORDER — EPHEDRINE 5 MG/ML INJ
INTRAVENOUS | Status: AC
  Filled 2020-09-30: qty 10

## 2020-09-30 MED ORDER — LACTATED RINGERS IV SOLN: INTRAVENOUS | Status: DC

## 2020-09-30 MED ORDER — 0.9 % SODIUM CHLORIDE (POUR BTL) OPTIME
TOPICAL | Status: DC | PRN
  Administered 2020-09-30: 1000 mL

## 2020-09-30 MED ORDER — EPHEDRINE SULFATE 50 MG/ML IJ SOLN
INTRAMUSCULAR | Status: DC | PRN
  Administered 2020-09-30: 5 mg via INTRAVENOUS

## 2020-09-30 MED ORDER — PROPOFOL 10 MG/ML IV BOLUS
INTRAVENOUS | Status: DC | PRN
  Administered 2020-09-30: 90 mg via INTRAVENOUS

## 2020-09-30 MED ORDER — BUPIVACAINE HCL (PF) 0.25 % IJ SOLN
INTRAMUSCULAR | Status: DC | PRN
  Administered 2020-09-30: 20 mL

## 2020-09-30 MED ORDER — PROPOFOL 10 MG/ML IV BOLUS
INTRAVENOUS | Status: AC
  Filled 2020-09-30: qty 20

## 2020-09-30 MED ORDER — CELECOXIB 200 MG PO CAPS
200.0000 mg | ORAL_CAPSULE | ORAL | Status: AC
  Administered 2020-09-30: 200 mg via ORAL

## 2020-09-30 MED ORDER — OXYCODONE HCL 5 MG PO TABS
ORAL_TABLET | ORAL | Status: AC
  Filled 2020-09-30: qty 1

## 2020-09-30 MED ORDER — DEXAMETHASONE SODIUM PHOSPHATE 10 MG/ML IJ SOLN
INTRAMUSCULAR | Status: DC | PRN
  Administered 2020-09-30: 5 mg via INTRAVENOUS

## 2020-09-30 MED ORDER — GABAPENTIN 300 MG PO CAPS
300.0000 mg | ORAL_CAPSULE | ORAL | Status: AC
  Administered 2020-09-30: 300 mg via ORAL

## 2020-09-30 MED ORDER — HYDROCODONE-ACETAMINOPHEN 5-325 MG PO TABS: 1.0000 | ORAL_TABLET | Freq: Four times a day (QID) | ORAL | 0 refills | Status: DC | PRN

## 2020-09-30 MED ORDER — FENTANYL CITRATE (PF) 100 MCG/2ML IJ SOLN: 25.0000 ug | INTRAMUSCULAR | Status: DC | PRN

## 2020-09-30 MED ORDER — ACETAMINOPHEN 500 MG PO TABS: 1000.0000 mg | ORAL_TABLET | ORAL | Status: DC

## 2020-09-30 MED ORDER — CEFAZOLIN SODIUM-DEXTROSE 2-4 GM/100ML-% IV SOLN
INTRAVENOUS | Status: AC
  Filled 2020-09-30: qty 100

## 2020-09-30 MED ORDER — OXYCODONE HCL 5 MG PO TABS
5.0000 mg | ORAL_TABLET | Freq: Once | ORAL | Status: AC
  Administered 2020-09-30: 5 mg via ORAL

## 2020-09-30 SURGICAL SUPPLY — 38 items
APPLIER CLIP 9.375 MED OPEN (MISCELLANEOUS)
BLADE SURG 15 STRL LF DISP TIS (BLADE) ×1 IMPLANT
BLADE SURG 15 STRL SS (BLADE) ×3
CANISTER SUC SOCK COL 7IN (MISCELLANEOUS) ×3 IMPLANT
CANISTER SUCT 1200ML W/VALVE (MISCELLANEOUS) ×3 IMPLANT
CHLORAPREP W/TINT 26 (MISCELLANEOUS) ×3 IMPLANT
CLIP APPLIE 9.375 MED OPEN (MISCELLANEOUS) IMPLANT
COVER BACK TABLE 60X90IN (DRAPES) ×3 IMPLANT
COVER MAYO STAND STRL (DRAPES) ×3 IMPLANT
COVER PROBE W GEL 5X96 (DRAPES) ×3 IMPLANT
COVER WAND RF STERILE (DRAPES) IMPLANT
DECANTER SPIKE VIAL GLASS SM (MISCELLANEOUS) ×3 IMPLANT
DERMABOND ADVANCED (GAUZE/BANDAGES/DRESSINGS) ×2
DERMABOND ADVANCED .7 DNX12 (GAUZE/BANDAGES/DRESSINGS) ×1 IMPLANT
DRAPE LAPAROSCOPIC ABDOMINAL (DRAPES) ×3 IMPLANT
DRAPE UTILITY XL STRL (DRAPES) ×3 IMPLANT
ELECT COATED BLADE 2.86 ST (ELECTRODE) ×3 IMPLANT
ELECT REM PT RETURN 9FT ADLT (ELECTROSURGICAL) ×3
ELECTRODE REM PT RTRN 9FT ADLT (ELECTROSURGICAL) ×1 IMPLANT
GLOVE BIO SURGEON STRL SZ7.5 (GLOVE) ×6 IMPLANT
GOWN STRL REUS W/ TWL LRG LVL3 (GOWN DISPOSABLE) ×2 IMPLANT
GOWN STRL REUS W/TWL LRG LVL3 (GOWN DISPOSABLE) ×6
KIT MARKER MARGIN INK (KITS) ×3 IMPLANT
NEEDLE HYPO 25X1 1.5 SAFETY (NEEDLE) IMPLANT
NS IRRIG 1000ML POUR BTL (IV SOLUTION) IMPLANT
PACK BASIN DAY SURGERY FS (CUSTOM PROCEDURE TRAY) ×3 IMPLANT
PENCIL SMOKE EVACUATOR (MISCELLANEOUS) ×3 IMPLANT
SLEEVE SCD COMPRESS KNEE MED (MISCELLANEOUS) ×3 IMPLANT
SPONGE LAP 18X18 RF (DISPOSABLE) ×3 IMPLANT
SUT MON AB 4-0 PC3 18 (SUTURE) ×3 IMPLANT
SUT SILK 2 0 SH (SUTURE) IMPLANT
SUT VICRYL 3-0 CR8 SH (SUTURE) ×3 IMPLANT
SYR CONTROL 10ML LL (SYRINGE) IMPLANT
TOWEL GREEN STERILE FF (TOWEL DISPOSABLE) ×3 IMPLANT
TRAY FAXITRON CT DISP (TRAY / TRAY PROCEDURE) ×3 IMPLANT
TUBE CONNECTING 20'X1/4 (TUBING) ×1
TUBE CONNECTING 20X1/4 (TUBING) ×2 IMPLANT
YANKAUER SUCT BULB TIP NO VENT (SUCTIONS) IMPLANT

## 2020-09-30 NOTE — Op Note (Signed)
09/30/2020  2:02 PM  PATIENT:  [name]  86 y.o. female  PRE-OPERATIVE DIAGNOSIS:  RIGHT BREAST CANCER  POST-OPERATIVE DIAGNOSIS:  RIGHT BREAST CANCER  PROCEDURE:  Procedure(s): RIGHT BREAST LUMPECTOMY WITH RADIOACTIVE SEED LOCALIZATION (Right)  SURGEON:  Surgeon(s) and Role:    * Jovita Kussmaul, MD - Primary  PHYSICIAN ASSISTANT:   ASSISTANTS: none   ANESTHESIA:   local and general  EBL:  5 mL   BLOOD ADMINISTERED:none  DRAINS: none   LOCAL MEDICATIONS USED:  MARCAINE     SPECIMEN:  Source of Specimen:  right breast tissue  DISPOSITION OF SPECIMEN:  PATHOLOGY  COUNTS:  YES  TOURNIQUET:  * No tourniquets in log *  DICTATION: .Dragon Dictation   After informed consent was obtained the patient was brought to the operating room and placed in the supine position on the operating table. After adequate induction of general anesthesia the patient's right breast was prepped with ChloraPrep, allowed to dry, and draped in usual sterile manner. An appropriate timeout was performed. Previously an I-125 seed was placed in the lower inner quadrant of the right breast to mark an area of invasive breast cancer. The neoprobe was set to I-125 in the area of radioactivity was readily identified. The area around this was infiltrated with quarter percent Marcaine. A curvilinear incision was made along the inframammary fold medially and nearest to the radioactivity. The incision was carried through the skin and subcutaneous tissue sharply with the electrocautery until the dissection reached the chest wall. The dissection was then carried superiorly between the breast tissue and the pectoralis muscle until the dissection was well beyond the area of the cancer. Dissection was then carried anteriorly between the breast tissue and the skin again until the dissection was well beyond the area of the cancer. Next a wedge of breast tissue was then removed sharply with the electrocautery around the  radioactive seed while checking the area of radioactivity frequently. Once the specimen was removed it was oriented with the appropriate paint colors. A specimen radiograph was obtained that showed the clip and seed to be near the center of the specimen. The specimen was then sent to pathology for further evaluation. Hemostasis was achieved using the Bovie electrocautery. The wound was irrigated with saline and infiltrated with more quarter percent Marcaine. The deep layer of the wound was then closed with layers of interrupted 3-0 Vicryl stitches. The skin was closed with a running 4-0 Monocryl subcuticular stitch. Dermabond dressings were applied. The patient tolerated the procedure well. At the end of the case all needle sponge and instrument counts were correct. The patient was then awakened and taken to recovery in stable condition.  PLAN OF CARE: Discharge to home after PACU  PATIENT DISPOSITION:  PACU - hemodynamically stable.   Delay start of Pharmacological VTE agent (>24hrs) due to surgical blood loss or risk of bleeding: not applicable

## 2020-09-30 NOTE — Interval H&P Note (Signed)
History and Physical Interval Note:  09/30/2020 12:48 PM  [name]  has presented today for surgery, with the diagnosis of RIGHT BREAST CANCER.  The various methods of treatment have been discussed with the patient and family. After consideration of risks, benefits and other options for treatment, the patient has consented to  Procedure(s): RIGHT BREAST LUMPECTOMY WITH RADIOACTIVE SEED LOCALIZATION (Right) as a surgical intervention.  The patient's history has been reviewed, patient examined, no change in status, stable for surgery.  I have reviewed the patient's chart and labs.  Questions were answered to the patient's satisfaction.     Autumn Messing III

## 2020-09-30 NOTE — Discharge Instructions (Signed)

## 2020-09-30 NOTE — Anesthesia Preprocedure Evaluation (Signed)
Anesthesia Evaluation  Patient identified by MRN, date of birth, ID band Patient awake    Reviewed: Allergy & Precautions, NPO status , Patient's Chart, lab work & pertinent test results  Airway Mallampati: I  TM Distance: >3 FB Neck ROM: Full    Dental  (+) Missing, Edentulous Upper   Pulmonary sleep apnea and Continuous Positive Airway Pressure Ventilation , COPD,  COPD inhaler, former smoker,    Pulmonary exam normal breath sounds clear to auscultation       Cardiovascular negative cardio ROS Normal cardiovascular exam Rhythm:Regular Rate:Normal     Neuro/Psych PSYCHIATRIC DISORDERS Anxiety Depression negative neurological ROS     GI/Hepatic negative GI ROS, Neg liver ROS,   Endo/Other  negative endocrine ROS  Renal/GU negative Renal ROS     Musculoskeletal negative musculoskeletal ROS (+)   Abdominal   Peds  Hematology negative hematology ROS (+)   Anesthesia Other Findings Day of surgery medications reviewed with the patient.  Reproductive/Obstetrics                             Anesthesia Physical Anesthesia Plan  ASA: III  Anesthesia Plan: General   Post-op Pain Management:    Induction: Intravenous  PONV Risk Score and Plan: 3 and Dexamethasone and Ondansetron  Airway Management Planned: LMA  Additional Equipment:   Intra-op Plan:   Post-operative Plan: Extubation in OR  Informed Consent: I have reviewed the patients History and Physical, chart, labs and discussed the procedure including the risks, benefits and alternatives for the proposed anesthesia with the patient or authorized representative who has indicated his/her understanding and acceptance.       Plan Discussed with: CRNA  Anesthesia Plan Comments:         Anesthesia Quick Evaluation

## 2020-09-30 NOTE — Anesthesia Postprocedure Evaluation (Signed)
Anesthesia Post Note  Patient: [name]  Procedure(s) Performed: RIGHT BREAST LUMPECTOMY WITH RADIOACTIVE SEED LOCALIZATION (Right Breast)     Patient location during evaluation: PACU Anesthesia Type: General Level of consciousness: awake and alert Pain management: pain level controlled Vital Signs Assessment: post-procedure vital signs reviewed and stable Respiratory status: spontaneous breathing, nonlabored ventilation and respiratory function stable Cardiovascular status: blood pressure returned to baseline and stable Postop Assessment: no apparent nausea or vomiting Anesthetic complications: no   No complications documented.  Last Vitals:  Vitals:   09/30/20 1415 09/30/20 1430  BP: (!) 138/58 (!) 137/58  Pulse: 62 63  Resp: 10 17  Temp:    SpO2: 100% 100%    Last Pain:  Vitals:   09/30/20 1430  TempSrc:   PainSc: 0-No pain                 Catalina Gravel

## 2020-09-30 NOTE — Anesthesia Procedure Notes (Signed)
Procedure Name: LMA Insertion Date/Time: 09/30/2020 1:16 PM Performed by: Glory Buff, CRNA Pre-anesthesia Checklist: Patient identified, Emergency Drugs available, Suction available and Patient being monitored Patient Re-evaluated:Patient Re-evaluated prior to induction Oxygen Delivery Method: Circle system utilized Preoxygenation: Pre-oxygenation with 100% oxygen Induction Type: IV induction LMA: LMA inserted LMA Size: 4.0 Number of attempts: 1 Placement Confirmation: positive ETCO2 and breath sounds checked- equal and bilateral Tube secured with: Tape (Paper tape used.) Dental Injury: Teeth and Oropharynx as per pre-operative assessment

## 2020-09-30 NOTE — H&P (Signed)
Reinaldo Raddle  Location: [facility] Patient #: [id number] DOB: 07-10-1932 Single / Language: Cleophus Molt / Race: White Female   History of Present Illness  The patient is a 86 year old female who presents with breast cancer.We are asked to see the patient in consultation by Dr. Burr Medico to evaluate her for a new right breast cancer. The patient is a 86 year old white female who presents with a screen detected assymetry in the LIQ of the right breast measuring 69m. the axilla looked neg. This was biopsied and came back as a grade 2 IDC that was ER and PR+ and Her2 - with a Ki67 of 10%. She quit smoking 5 years ago.   Past Surgical History Appendectomy  Cesarean Section - 1  Hysterectomy (not due to cancer) - Partial  Tonsillectomy   Diagnostic Studies History Colonoscopy  5-10 years ago Mammogram  within last year Pap Smear  >5 years ago  Medication History  Medications Reconciled  Social History  Alcohol use  Occasional alcohol use. Caffeine use  Carbonated beverages, Coffee. No drug use  Tobacco use  Former smoker.  Family History  Arthritis  Mother. Depression  Daughter, Mother, Son. Diabetes Mellitus  Sister. Heart Disease  Sister. Hypertension  Father, Sister. Seizure disorder  Daughter. Thyroid problems  Father, Mother.  Pregnancy / Birth History  Age at menarche  89years. Age of menopause  <47 Gravida  5 Irregular periods  Length (months) of breastfeeding  3-6 Maternal age  84-25PPara  55 Other Problems  Anxiety Disorder  Arthritis  Back Pain  Depression  Diabetes Mellitus  Emphysema Of Lung  Heart murmur  Sleep Apnea  Transfusion history  Umbilical Hernia Repair  Vascular Disease     Review of Systems  General Not Present- Appetite Loss, Chills, Fatigue, Fever, Night Sweats, Weight Gain and Weight Loss. Skin Present- Change in Wart/Mole, Dryness and Rash. Not Present- Hives, Jaundice, New Lesions,  Non-Healing Wounds and Ulcer. HEENT Present- Hearing Loss and Wears glasses/contact lenses. Not Present- Earache, Hoarseness, Nose Bleed, Oral Ulcers, Ringing in the Ears, Seasonal Allergies, Sinus Pain, Sore Throat, Visual Disturbances and Yellow Eyes. Respiratory Not Present- Bloody sputum, Chronic Cough, Difficulty Breathing, Snoring and Wheezing. Breast Present- Skin Changes. Not Present- Breast Mass, Breast Pain and Nipple Discharge. Cardiovascular Not Present- Chest Pain, Difficulty Breathing Lying Down, Leg Cramps, Palpitations, Rapid Heart Rate, Shortness of Breath and Swelling of Extremities. Gastrointestinal Present- Constipation and Hemorrhoids. Not Present- Abdominal Pain, Bloating, Bloody Stool, Change in Bowel Habits, Chronic diarrhea, Difficulty Swallowing, Excessive gas, Gets full quickly at meals, Indigestion, Nausea, Rectal Pain and Vomiting. Female Genitourinary Not Present- Frequency, Nocturia, Painful Urination, Pelvic Pain and Urgency. Musculoskeletal Present- Joint Pain. Not Present- Back Pain, Joint Stiffness, Muscle Pain, Muscle Weakness and Swelling of Extremities. Neurological Present- Trouble walking. Not Present- Decreased Memory, Fainting, Headaches, Numbness, Seizures, Tingling, Tremor and Weakness. Psychiatric Present- Depression. Not Present- Anxiety, Bipolar, Change in Sleep Pattern, Fearful and Frequent crying. Endocrine Not Present- Cold Intolerance, Excessive Hunger, Hair Changes, Heat Intolerance, Hot flashes and New Diabetes. Hematology Present- Easy Bruising. Not Present- Blood Thinners, Excessive bleeding, Gland problems, HIV and Persistent Infections.   Physical Exam  General Mental Status-Alert. General Appearance-Consistent with stated age. Hydration-Well hydrated. Voice-Normal.  Head and Neck Head-normocephalic, atraumatic with no lesions or palpable masses. Trachea-midline. Thyroid Gland Characteristics - normal size and  consistency.  Eye Eyeball - Bilateral-Extraocular movements intact. Sclera/Conjunctiva - Bilateral-No scleral icterus.  Chest and Lung Exam Chest  and lung exam reveals -quiet, even and easy respiratory effort with no use of accessory muscles and on auscultation, normal breath sounds, no adventitious sounds and normal vocal resonance. Inspection Chest Wall - Normal. Back - normal.  Breast Note: there is a palpable bruise in the LIQ of the right breast. other than this there is no palpable mass in either breast. there is no palpable axillary, supraclavicular, or cervical lymphadenopathy   Cardiovascular Cardiovascular examination reveals -normal heart sounds, regular rate and rhythm with no murmurs and normal pedal pulses bilaterally.  Abdomen Inspection Inspection of the abdomen reveals - No Hernias. Skin - Scar - no surgical scars. Palpation/Percussion Palpation and Percussion of the abdomen reveal - Soft, Non Tender, No Rebound tenderness, No Rigidity (guarding) and No hepatosplenomegaly. Auscultation Auscultation of the abdomen reveals - Bowel sounds normal.  Neurologic Neurologic evaluation reveals -alert and oriented x 3 with no impairment of recent or remote memory. Mental Status-Normal.  Musculoskeletal Normal Exam - Left-Upper Extremity Strength Normal and Lower Extremity Strength Normal. Normal Exam - Right-Upper Extremity Strength Normal and Lower Extremity Strength Normal.  Lymphatic Head & Neck  General Head & Neck Lymphatics: Bilateral - Description - Normal. Axillary  General Axillary Region: Bilateral - Description - Normal. Tenderness - Non Tender. Femoral & Inguinal  Generalized Femoral & Inguinal Lymphatics: Bilateral - Description - Normal. Tenderness - Non Tender.    Assessment & Plan  MALIGNANT NEOPLASM OF LOWER-INNER QUADRANT OF RIGHT BREAST OF FEMALE, ESTROGEN RECEPTOR POSITIVE (C50.311) Impression: The patient appears to have a  small stage 1 cancer in the LIQ of the right breast. I have discussed with her the different options for treatment and at this point she favors breast conservation which i feel is a very reasonable way to treat her cancer. Given her age she will not need a node evaluation. She will meet with medical and radiation oncology to discuss adjuvant therapy. I have discussed with her in detail the risks and benefits of the surgery as well as some of the technical aspects including the use of a radioactive seed and she understands and wishes to proceed. This patient encounter took 60 minutes today to perform the following: take history, perform exam, review outside records, interpret imaging, counsel the patient on their diagnosis and document encounter, findings & plan in the EHR Current Plans Referred to Oncology, for evaluation and follow up (Oncology). Routine.

## 2020-09-30 NOTE — Transfer of Care (Signed)
Immediate Anesthesia Transfer of Care Note  Patient: [name]  Procedure(s) Performed: RIGHT BREAST LUMPECTOMY WITH RADIOACTIVE SEED LOCALIZATION (Right Breast)  Patient Location: PACU  Anesthesia Type:General  Level of Consciousness: drowsy, patient cooperative and responds to stimulation  Airway & Oxygen Therapy: Patient Spontanous Breathing and Patient connected to face mask oxygen  Post-op Assessment: Report given to RN and Post -op Vital signs reviewed and stable  Post vital signs: Reviewed and stable  Last Vitals:  Vitals Value Taken Time  BP    Temp    Pulse 60 09/30/20 1411  Resp 0 09/30/20 1411  SpO2 100 % 09/30/20 1411  Vitals shown include unvalidated device data.  Last Pain:  Vitals:   09/30/20 1134  TempSrc: Oral  PainSc: 0-No pain         Complications: No complications documented.

## 2020-10-01 ENCOUNTER — Telehealth: Payer: Self-pay | Admitting: Hematology

## 2020-10-01 ENCOUNTER — Encounter (HOSPITAL_BASED_OUTPATIENT_CLINIC_OR_DEPARTMENT_OTHER): Payer: Self-pay | Admitting: General Surgery

## 2020-10-01 ENCOUNTER — Telehealth: Payer: Self-pay | Admitting: Hematology and Oncology

## 2020-10-01 NOTE — Telephone Encounter (Signed)
Scheduled appt per 10/14 sch msg - pt is aware .

## 2020-10-01 NOTE — Telephone Encounter (Signed)
Scheduled appt per 10/14 sch msg - left message for daughter with appt date and time

## 2020-10-04 LAB — SURGICAL PATHOLOGY

## 2020-10-07 ENCOUNTER — Encounter: Payer: Self-pay | Admitting: *Deleted

## 2020-10-08 NOTE — Progress Notes (Signed)
[place] [facility]   Telephone:(336) [phone] Fax:(336) [phone]   Clinic Follow up Note   Patient Care Team: Cari Caraway, MD as PCP - General (Family Medicine) Mauro Kaufmann, RN as Oncology Nurse Navigator Rockwell Germany, RN as Oncology Nurse Navigator Jovita Kussmaul, MD as Consulting Physician (General Surgery) Truitt Merle, MD as Consulting Physician (Hematology) Eppie Gibson, MD as Attending Physician (Radiation Oncology)  Date of Service:  10/11/2020  CHIEF COMPLAINT: F/u of right breast cancer   SUMMARY OF ONCOLOGIC HISTORY: Oncology History Overview Note  Cancer Staging Malignant neoplasm of upper-inner quadrant of right breast in female, estrogen receptor positive ([place]) Staging form: Breast, AJCC 8th Edition - Clinical stage from 08/31/2020: Stage IA (cT1b, cN0, cM0, G2, ER+, PR+, HER2-) - Signed by Truitt Merle, MD on 09/07/2020    Malignant neoplasm of upper-inner quadrant of right breast in female, estrogen receptor positive ([place])  08/02/2020 Mammogram   IMPRESSION: Interval increase in size of an asymmetry in the slightly medial right breast. Patient was unable to tolerate an mL view today due to a rash underneath her breast. There is a possible subtle sonographic correlate at 4:30 o'clock 6 cm from the nipple. This measures 0.9 x 0.7 x 0.6 cm.   08/31/2020 Cancer Staging   Staging form: Breast, AJCC 8th Edition - Clinical stage from 08/31/2020: Stage IA (cT1b, cN0, cM0, G2, ER+, PR+, HER2-) - Signed by Truitt Merle, MD on 09/07/2020   08/31/2020 Initial Biopsy   Diagnosis Breast, right, needle core biopsy, LIQ, coil clip - INVASIVE DUCTAL CARCINOMA - SEE COMMENT Microscopic Comment Based on the biopsy, the carcinoma appears Nottingham grade 2 of 3, has foci of extracellular mucin and measures 1 cm in greatest linear extent. Prognostic markers (ER/PR/ki-67/HER2) are pending and will be reported in an addendum. Dr. Jeannie Done reviewed the case and agrees with the  above diagnosis. These results were called to [facility] on September 01, 2020.   08/31/2020 Receptors her2   ADDITIONAL INFORMATION: Her2 is NEGATIVE (0).   Results: IMMUNOHISTOCHEMICAL AND MORPHOMETRIC ANALYSIS PERFORMED MANUALLY Estrogen Receptor: 95%, POSITIVE, STRONG STAINING INTENSITY Progesterone Receptor: 95%, POSITIVE, STRONG STAINING INTENSITY Proliferation Marker Ki67: 10%   09/03/2020 Initial Diagnosis   Malignant neoplasm of upper-inner quadrant of right breast in female, estrogen receptor positive ([place])   09/30/2020 Surgery   RIGHT BREAST LUMPECTOMY WITH RADIOACTIVE SEED LOCALIZATION by Dr Marlou Starks   09/30/2020 Pathology Results   FINAL MICROSCOPIC DIAGNOSIS:   A. BREAST, RIGHT, LUMPECTOMY:  - Invasive ductal carcinoma with extracellular mucin, 1.3 cm, grade 2  - Ductal carcinoma in situ low-grade  - Resection margins are negative for invasive carcinoma; closest  anterior margin at less than 1 mm and lateral margin at 2 mm  - DCIS focally involves the anterior margin and is 1 mm from the lateral  margin  - Negative for lymphovascular perineural invasion  - Biopsy site changes  - See oncology table       CURRENT THERAPY:  PENDING Radiation   INTERVAL HISTORY:  [name] is here for a follow up of right breast cancer. She presents to the clinic with her daughter. She notes today she feels hot with nausea walking up to my office. This is the farthest she walked since her surgery. She notes she tolerated surgery well. She notes her breast skin appears "angry". She notes most of her frustration is feeling like a burden with her family.    REVIEW OF SYSTEMS:  Constitutional: Denies fevers, chills or abnormal weight loss Eyes: Denies blurriness of vision Ears, nose, mouth, throat, and face: Denies mucositis or sore throat Respiratory: Denies cough, dyspnea or wheezes Cardiovascular: Denies palpitation, chest discomfort or lower extremity  swelling Gastrointestinal:  Denies nausea, heartburn or change in bowel habits Skin: Denies abnormal skin rashes Lymphatics: Denies new lymphadenopathy or easy bruising Neurological:Denies numbness, tingling or new weaknesses Behavioral/Psych: Mood is stable, no new changes  All other systems were reviewed with the patient and are negative.  MEDICAL HISTORY:  Past Medical History:  Diagnosis Date   Anxiety    Back pain    Cancer (HCC)    Depression    Emphysema    Esophageal spasm    Lung nodule    OSA (obstructive sleep apnea)    Osteomalacia    Vertigo    Vitamin D deficiency     SURGICAL HISTORY: Past Surgical History:  Procedure Laterality Date   ABDOMINAL HYSTERECTOMY     APPENDECTOMY     BREAST EXCISIONAL BIOPSY Left 1978   BREAST LUMPECTOMY WITH RADIOACTIVE SEED LOCALIZATION Right 09/30/2020   Procedure: RIGHT BREAST LUMPECTOMY WITH RADIOACTIVE SEED LOCALIZATION;  Surgeon: Jovita Kussmaul, MD;  Location: [facility];  Service: General;  Laterality: Right;   CARPAL TUNNEL RELEASE     ROTATOR CUFF REPAIR      I have reviewed the social history and family history with the patient and they are unchanged from previous note.  ALLERGIES:  is allergic to adhesive [tape] and latex.  MEDICATIONS:  Current Outpatient Medications  Medication Sig Dispense Refill   acetaminophen (TYLENOL 8 HOUR) 650 MG CR tablet Take 650 mg by mouth 2 (two) times daily as needed for pain.      albuterol (PROVENTIL HFA;VENTOLIN HFA) 108 (90 Base) MCG/ACT inhaler Inhale 2 puffs into the lungs every 6 (six) hours as needed for wheezing or shortness of breath.     aspirin EC 81 MG tablet Take 81 mg by mouth daily.     atorvastatin (LIPITOR) 10 MG tablet Take 10 mg by mouth at bedtime.     buPROPion (WELLBUTRIN XL) 300 MG 24 hr tablet Take 150 mg by mouth daily.      Cholecalciferol (VITAMIN D) 2000 units tablet Take 2,000 Units by mouth daily.       escitalopram (LEXAPRO) 20 MG tablet Take 40 mg by mouth daily.  2   fluticasone (FLONASE) 50 MCG/ACT nasal spray Place 2 sprays into both nostrils daily.     HYDROcodone-acetaminophen (NORCO/VICODIN) 5-325 MG tablet Take 1-2 tablets by mouth every 6 (six) hours as needed for moderate pain or severe pain. 10 tablet 0   Multiple Vitamin (MULTIVITAMIN WITH MINERALS) TABS tablet Take 1 tablet by mouth daily.     Polyethyl Glycol-Propyl Glycol (SYSTANE) 0.4-0.3 % SOLN Place 1 drop into both eyes daily as needed (dry eyes).     tiotropium (SPIRIVA) 18 MCG inhalation capsule Place 18 mcg into inhaler and inhale daily.     No current facility-administered medications for this visit.    PHYSICAL EXAMINATION: ECOG PERFORMANCE STATUS: 2 - Symptomatic, <50% confined to bed  Vitals:   10/11/20 1342  BP: (!) 162/85  Pulse: 86  Resp: 20  Temp: 98.9 F (37.2 C)  SpO2: 98%   Filed Weights   10/11/20 1342  Weight: 159 lb 12.8 oz (72.5 kg)    Due to COVID19 we will limit examination to appearance. Patient had no complaints.  GENERAL:alert, no  distress and comfortable SKIN: skin color normal, no rashes or significant lesions EYES: normal, Conjunctiva are pink and non-injected, sclera clear  NEURO: alert & oriented x 3 with fluent speech   LABORATORY DATA:  I have reviewed the data as listed CBC Latest Ref Rng & Units 09/08/2020 03/14/2020 03/14/2020  WBC 4.0 - 10.5 K/uL 7.9 - 9.4  Hemoglobin 12.0 - 15.0 g/dL 13.6 14.3 14.5  Hematocrit 36 - 46 % 41.6 42.0 43.6  Platelets 150 - 400 K/uL 164 - 162     CMP Latest Ref Rng & Units 09/08/2020 03/14/2020 09/15/2017  Glucose 70 - 99 mg/dL 94 102(H) 117(H)  BUN 8 - 23 mg/dL 13 15 14   Creatinine 0.44 - 1.00 mg/dL 0.80 0.60 0.73  Sodium 135 - 145 mmol/L 138 140 138  Potassium 3.5 - 5.1 mmol/L 4.1 4.1 3.6  Chloride 98 - 111 mmol/L 101 106 104  CO2 22 - 32 mmol/L 32 - 26  Calcium 8.9 - 10.3 mg/dL 9.7 - 8.9  Total Protein 6.5 - 8.1 g/dL 6.9 - -    Total Bilirubin 0.3 - 1.2 mg/dL 0.5 - -  Alkaline Phos 38 - 126 U/L 74 - -  AST 15 - 41 U/L 15 - -  ALT 0 - 44 U/L 14 - -      RADIOGRAPHIC STUDIES: I have personally reviewed the radiological images as listed and agreed with the findings in the report. No results found.   ASSESSMENT & PLAN:  [name] is a 86 y.o. female with    1. Malignant neoplasm of upper-inner quadrant of right breast, Stage IA, c(T1bN0M0), ER+/PR+/HER2-, Grade II -She underwent right breast lumpectomy with Dr Marlou Starks on 09/30/20. We discussed her surgical pathology which showed her 1.3cm invasive cancer, G2, with components of low grade DCIS was completely removed except positive anterior margins with DCIS.  -I discussed standard for positive margins would include re-excision surgery to get negative margin. However due to her advanced age and low grade DCIS only on positive margin, we think it's reasonable to avoid second surgery if she is willing to consider adjuvant radiation and antiestrogen therapy.  This is the consensus from her surgeon Dr. Marlou Starks, me and radiation oncologist Dr. Isidore Moos.  She initially was very reluctant to consider radiation, due to her fear of transportation burden her daughter.  After lengthy discussion, with her daughter's encouragement, she is willing to consult with Dr Isidore Moos about this, and will likely proceed.  -Given the strong ER and PR expression in her postmenopausal status, significant arthritis, I would recommend adjuvant endocrine therapy with Tamoxifen for a total of 5 years to reduce the risk of cancer recurrence. Will review this further in the near future.  -We also discussed breast cancer surveillance, with annual mammogram, routine lab and office visit with physical exam.  We discussed symptoms she needs to watch. -F/u the last week of her radiation    2. History of falls -She has had fall in the past year. Her worst fall was in 02/2020 which results in rib fracture with  pneumothorax and interrupted her healing with another fall in 06/2020.  -She ambulates with walker and cane now. She was very active at home with chores, but not much lately due to concern with another fall. She has medical alert necklace.  -She had DEXA years ago, I recommend a new baseline, she is agreeable.    3. Comorbidities:  COPD, Esophageal spasms, heart murmur, PAD, Arthritis, Depression -On Wellbutrin and  Lexapro for longterm occasional depression. -She has arthritis in her hands but mainly in her back. She has received injections before, now controlled on extended release Tylenol BID.  -Continue to F/u with PCP and vascular surgeon.    PLAN:  -Flu shot today  -Refer to Dr Isidore Moos to discuss adjuvant radiation, she prefers virtual visit.  I sent a message to Dr. Isidore Moos.  -F/u last week of radiation    No problem-specific Assessment & Plan notes found for this encounter.   No orders of the defined types were placed in this encounter.  All questions were answered. The patient knows to call the clinic with any problems, questions or concerns. No barriers to learning was detected. The total time spent in the appointment was 30 minutes.     Truitt Merle, MD 10/11/2020   I, Joslyn Devon, am acting as scribe for Truitt Merle, MD.   I have reviewed the above documentation for accuracy and completeness, and I agree with the above.

## 2020-10-11 ENCOUNTER — Other Ambulatory Visit: Payer: Self-pay

## 2020-10-11 ENCOUNTER — Encounter: Payer: Self-pay | Admitting: Hematology

## 2020-10-11 ENCOUNTER — Encounter: Payer: Self-pay | Admitting: *Deleted

## 2020-10-11 ENCOUNTER — Inpatient Hospital Stay: Payer: Medicare PPO | Attending: Hematology | Admitting: Hematology

## 2020-10-11 VITALS — BP 162/85 | HR 86 | Temp 98.9°F | Resp 20 | Ht 61.0 in | Wt 159.8 lb

## 2020-10-11 DIAGNOSIS — F329 Major depressive disorder, single episode, unspecified: Secondary | ICD-10-CM | POA: Insufficient documentation

## 2020-10-11 DIAGNOSIS — Z79899 Other long term (current) drug therapy: Secondary | ICD-10-CM | POA: Diagnosis not present

## 2020-10-11 DIAGNOSIS — C50211 Malignant neoplasm of upper-inner quadrant of right female breast: Secondary | ICD-10-CM | POA: Diagnosis not present

## 2020-10-11 DIAGNOSIS — Z9049 Acquired absence of other specified parts of digestive tract: Secondary | ICD-10-CM | POA: Diagnosis not present

## 2020-10-11 DIAGNOSIS — Z23 Encounter for immunization: Secondary | ICD-10-CM | POA: Insufficient documentation

## 2020-10-11 DIAGNOSIS — J449 Chronic obstructive pulmonary disease, unspecified: Secondary | ICD-10-CM | POA: Diagnosis not present

## 2020-10-11 DIAGNOSIS — Z170 Estrogen receptor positive status [ER+]: Secondary | ICD-10-CM

## 2020-10-11 DIAGNOSIS — F419 Anxiety disorder, unspecified: Secondary | ICD-10-CM | POA: Diagnosis not present

## 2020-10-11 DIAGNOSIS — G4733 Obstructive sleep apnea (adult) (pediatric): Secondary | ICD-10-CM | POA: Insufficient documentation

## 2020-10-11 DIAGNOSIS — Z7982 Long term (current) use of aspirin: Secondary | ICD-10-CM | POA: Insufficient documentation

## 2020-10-11 MED ORDER — INFLUENZA VAC A&B SA ADJ QUAD 0.5 ML IM PRSY
PREFILLED_SYRINGE | INTRAMUSCULAR | Status: AC
  Filled 2020-10-11: qty 0.5

## 2020-10-11 MED ORDER — INFLUENZA VAC A&B SA ADJ QUAD 0.5 ML IM PRSY
0.5000 mL | PREFILLED_SYRINGE | Freq: Once | INTRAMUSCULAR | Status: AC
  Administered 2020-10-11: 0.5 mL via INTRAMUSCULAR

## 2020-10-14 ENCOUNTER — Telehealth: Payer: Self-pay | Admitting: Hematology

## 2020-10-14 ENCOUNTER — Encounter: Payer: Self-pay | Admitting: *Deleted

## 2020-10-14 NOTE — Telephone Encounter (Signed)
No 10/25 los

## 2020-10-22 DIAGNOSIS — F418 Other specified anxiety disorders: Secondary | ICD-10-CM | POA: Diagnosis not present

## 2020-10-22 DIAGNOSIS — R053 Chronic cough: Secondary | ICD-10-CM | POA: Diagnosis not present

## 2020-10-27 ENCOUNTER — Encounter: Payer: Self-pay | Admitting: Radiation Oncology

## 2020-10-27 NOTE — Progress Notes (Signed)
Location of Breast Cancer: Malignant neoplasm of upper-inner quadrant of RIGHT breast, estrogen receptor positive  Histology per Pathology Report:  09/30/2020 FINAL MICROSCOPIC DIAGNOSIS:  A. BREAST, RIGHT, LUMPECTOMY:  - Invasive ductal carcinoma with extracellular mucin, 1.3 cm, grade 2  - Ductal carcinoma in situ low-grade  - Resection margins are negative for invasive carcinoma; closest anterior margin at less than 1 mm and lateral margin at 2 mm  - DCIS focally involves the anterior margin and is 1 mm from the lateral margin  - Negative for lymphovascular perineural invasion  - Biopsy site changes  Receptor Status: ER(95%), PR (95%), Her2-neu (negative), Ki-67(10%)  Did patient present with symptoms (if so, please note symptoms) or was this found on screening mammography?: Ultrasound of breast on 08/02/2020 revealed interval increase in size of an asymmetry in the slightly medial right breast. There was noted to be a possible subtle sonographic correlate at the 4:30 o'clock position 6 cm from the nipple.  Mass measured 9 mm on ultrasound.  Axilla negative on ultrasound.   Past/Anticipated interventions by surgeon, if any: 09/30/2020 Dr. Autumn Messing RIGHT BREAST LUMPECTOMY WITH RADIOACTIVE SEED LOCALIZATION   Past/Anticipated interventions by medical oncology, if any:  Under care of Dr. Truitt Merle 10/11/2020 -I discussed standard for positive margins would include re-excision surgery to get negative margin. However due to her advanced age and low grade DCIS only on positive margin, we think it's reasonable to avoid second surgery if she is willing to consider adjuvant radiation and antiestrogen therapy.  This is the consensus from her surgeon Dr. Marlou Starks, me and radiation oncologist Dr. Isidore Moos.  She initially was very reluctant to consider radiation, due to her fear of transportation burden her daughter.  After lengthy discussion, with her daughter's encouragement, she is willing to consult  with Dr Isidore Moos about this, and will likely proceed.  -Given the strong ER and PR expression in her postmenopausal status,significant arthritis,I would recommend adjuvant endocrine therapy with Tamoxifenfor a total of 5 years to reduce the risk of cancer recurrence. Will review this further in the near future.  -We also discussed breast cancer surveillance, with annual mammogram, routine lab and office visit with physical exam.  We discussed symptoms she needs to watch. -F/u the last week of her radiation   Lymphedema issues, if any:  Patient denies    Pain issues, if any:  Reports lumpectomy incision is still tender. She also deals with arthritic spurs in both knees and her spine   SAFETY ISSUES:  Prior radiation? No  Pacemaker/ICD? No  Possible current pregnancy? No--hysterectomy  Is the patient on methotrexate? No  Current Complaints / other details:   She lives alone and is widowed. She notes she is overall independent at home and able to do chores and ADLs. She has 4 daughters who live in town and help as needed.  She did have falls in the past year, her worst in 02/2020 with rib fracture withpneumothoraxand interrupted her healing with another fall in 06/2020. She had left breast lumpectomy in her 50s after being on hormonal replacement injections

## 2020-10-29 ENCOUNTER — Ambulatory Visit
Admission: RE | Admit: 2020-10-29 | Discharge: 2020-10-29 | Disposition: A | Discharge status: Home / Self Care | Payer: Medicare PPO | Source: Ambulatory Visit | Attending: Radiation Oncology | Admitting: Radiation Oncology

## 2020-10-29 ENCOUNTER — Other Ambulatory Visit: Payer: Self-pay

## 2020-10-29 ENCOUNTER — Encounter: Payer: Self-pay | Admitting: Radiation Oncology

## 2020-10-29 DIAGNOSIS — Z170 Estrogen receptor positive status [ER+]: Secondary | ICD-10-CM | POA: Diagnosis not present

## 2020-10-29 DIAGNOSIS — Z510 Encounter for antineoplastic radiation therapy: Secondary | ICD-10-CM | POA: Insufficient documentation

## 2020-10-29 DIAGNOSIS — C50311 Malignant neoplasm of lower-inner quadrant of right female breast: Secondary | ICD-10-CM | POA: Diagnosis not present

## 2020-10-29 NOTE — Progress Notes (Signed)
Radiation Oncology         [phone] ________________________________  Name: [name] MRN: [id number]  Date: 10/29/2020  DOB: Mar 05, 1932  Follow-Up Visit Note by MyChart video.  The patient opted for telemedicine to maximize safety during the pandemic.    Outpatient  CC: Cari Caraway, MD  Truitt Merle, MD  Diagnosis:      ICD-10-CM   1. Carcinoma of lower-inner quadrant of right breast in female, estrogen receptor positive ([place])  C50.311    Z17.0     Cancer Staging Carcinoma of lower-inner quadrant of right breast in female, estrogen receptor positive ([place]) Staging form: Breast, AJCC 8th Edition - Pathologic: Stage Unknown (pT1c, pNX, cM0, G2, ER+, PR+, HER2-) - Signed by Eppie Gibson, MD on 10/29/2020  Malignant neoplasm of upper-inner quadrant of right breast in female, estrogen receptor positive ([place]) Staging form: Breast, AJCC 8th Edition - Clinical stage from 08/31/2020: Stage IA (cT1b, cN0, cM0, G2, ER+, PR+, HER2-) - Signed by Truitt Merle, MD on 09/07/2020    CHIEF COMPLAINT: Here to discuss management of right breast cancer  Narrative:  The patient returns today for follow-up. She was seen in consultation during tumor board on 09/08/2020. At that time, it was recommended that she proceed with breast conserving surgery followed by adjuvant radiation therapy and then antiestrogen therapy.    She has not undergone any significant imaging since consultation.  Breast surgery on the date of 09/30/2020 revealed: tumor size of 1.3 cm; histology of invasive ductal carcinoma with extracellular mucin as well as low-grade ductal carcinoma in situ; margin status to invasive disease was negative with the closest anterior margin at less than 1 mm and lateral margin at 2 mm; margin status to in situ disease of focal involvement of the anterior margin and less than 1 mm from the lateral margin; negative for lymphovascular perineural invasion; ER status: 95% strong; PR status: 95%  strong; Her2 status: negative; Grade: 2. No nodes removed. No further surgery planned.   Symptomatically, the patient reports: She is doing well. She lives alone. She is independent with her activities of daily living. Her daughter helps her with transportation.        ALLERGIES:  is allergic to adhesive [tape] and latex.  Meds: Current Outpatient Medications  Medication Sig Dispense Refill  . acetaminophen (TYLENOL 8 HOUR) 650 MG CR tablet Take 650 mg by mouth 2 (two) times daily as needed for pain.     Marland Kitchen atorvastatin (LIPITOR) 10 MG tablet Take 10 mg by mouth at bedtime.    Marland Kitchen buPROPion (WELLBUTRIN XL) 150 MG 24 hr tablet Take 150 mg by mouth daily.     . Cholecalciferol (VITAMIN D) 2000 units tablet Take 4,000 Units by mouth daily.     Marland Kitchen escitalopram (LEXAPRO) 20 MG tablet Take 40 mg by mouth daily.  2  . fluticasone (FLONASE) 50 MCG/ACT nasal spray Place 2 sprays into both nostrils daily.    . Multiple Vitamin (MULTIVITAMIN WITH MINERALS) TABS tablet Take 1 tablet by mouth daily.    Marland Kitchen omeprazole (PRILOSEC) 40 MG capsule Take 40 mg by mouth daily.    Vladimir Faster Glycol-Propyl Glycol (SYSTANE) 0.4-0.3 % SOLN Place 1 drop into both eyes daily as needed (dry eyes).    Marland Kitchen tiotropium (SPIRIVA) 18 MCG inhalation capsule Place 18 mcg into inhaler and inhale daily.    Marland Kitchen albuterol (PROVENTIL HFA;VENTOLIN HFA) 108 (90 Base) MCG/ACT inhaler Inhale 2 puffs into the lungs every 6 (six) hours as  needed for wheezing or shortness of breath. (Patient not taking: Reported on 10/29/2020)    . ALPRAZolam (XANAX) 0.25 MG tablet Take 0.25 mg by mouth daily as needed. (Patient not taking: Reported on 10/29/2020)    . aspirin EC 81 MG tablet Take 81 mg by mouth daily. (Patient not taking: Reported on 10/29/2020)    . HYDROcodone-acetaminophen (NORCO/VICODIN) 5-325 MG tablet Take 1-2 tablets by mouth every 6 (six) hours as needed for moderate pain or severe pain. (Patient not taking: Reported on 10/29/2020) 10 tablet  0   No current facility-administered medications for this encounter.    Physical Findings:  vitals were not taken for this visit. .     General: Alert and oriented, in no acute distress   Lab Findings: Lab Results  Component Value Date   WBC 7.9 09/08/2020   HGB 13.6 09/08/2020   HCT 41.6 09/08/2020   MCV 94.1 09/08/2020   PLT 164 09/08/2020    _0 @  Radiographic Findings: MM Breast Surgical Specimen  Result Date: 09/30/2020 CLINICAL DATA:  Right lumpectomy for recently diagnosed invasive ductal carcinoma. EXAM: SPECIMEN RADIOGRAPH OF THE RIGHT BREAST COMPARISON:  Previous exam(s). FINDINGS: Status post excision of the right breast. The radioactive seed and coil shaped biopsy marker clip are present, completely intact. IMPRESSION: Specimen radiograph of the right breast. Electronically Signed   By: Claudie Revering M.D.   On: 09/30/2020 13:46   MM RT RADIOACTIVE SEED LOC MAMMO GUIDE  Result Date: 09/29/2020 CLINICAL DATA:  Right breast cancer for seed localization prior to surgery. EXAM: MAMMOGRAPHIC GUIDED RADIOACTIVE SEED LOCALIZATION OF THE right BREAST COMPARISON:  Previous exam(s). FINDINGS: Patient presents for radioactive seed localization prior to right breast surgery. I met with the patient and we discussed the procedure of seed localization including benefits and alternatives. We discussed the high likelihood of a successful procedure. We discussed the risks of the procedure including infection, bleeding, tissue injury and further surgery. We discussed the low dose of radioactivity involved in the procedure. Informed, written consent was given. The usual time-out protocol was performed immediately prior to the procedure. Using mammographic guidance, sterile technique, 1% lidocaine and an I-125 radioactive seed, core biopsy clip was localized using a medial approach. The follow-up mammogram images confirm the seed in the expected location and were marked for Dr. Marlou Starks.  Follow-up survey of the patient confirms presence of the radioactive seed. Order number of I-125 seed:  [id number]. Total activity:  3.545 millicurie reference Date: August 26, 2020 The patient tolerated the procedure well and was released from the [facility]. She was given instructions regarding seed removal. IMPRESSION: Radioactive seed localization right breast. No apparent complications. Electronically Signed   By: Abelardo Diesel M.D.   On: 09/29/2020 16:00    Impression/Plan: This is a lovely 86 year old woman with stage I right breast cancer, positive margin to DCIS  We discussed adjuvant radiotherapy today.  I recommend 4 weeks of hypofractionated radiation therapy to the right breast and axilla with high tangents in order to reduce risk of loco regional recurrence. Given that she has a positive margin to DCIS, the benefits of radiation therapy are greater than estimated at her original consultation. She does not have plans for additional surgery /anesthesia. She has spoken to medical oncology about eventually taking antiestrogen therapy for another layer of protection.  I reviewed the logistics, benefits, risks, and potential side effects of this treatment in detail. Risks may include but not necessary be limited to acute and late injury  to tissue in the radiation fields such as skin irritation (change in color/pigmentation, itching, dryness, pain, peeling) and breast /axillary tenderness. She may experience fatigue. She may experience some breast pain. We discussed the techniques that will maximize treatment accuracy and safety she understands that the risk of internal organ injury is very low and that a minority of her lung tissue will be exposed to radiation.  The patient asked good questions which I answered to her satisfaction. She is enthusiastic about proceeding with treatment. She will come in later today for treatment planning and start treatment next week.  The patient's daughter  denies any need for transportation help and will arrange for her to be brought to all of her appointments.  On date of service, in total, I spent 30 minutes on this encounter.  This encounter was provided by telemedicine platform MyChart video The patient has given verbal consent for this type of encounter and has been advised to only accept a meeting of this type in a secure network environment.  The attendants for this meeting include Eppie Gibson  and Reinaldo Raddle.  During the encounter, Eppie Gibson was located at [facility] Radiation Oncology Department.  Reinaldo Raddle was located at home.  _____________________________________   Eppie Gibson, MD  This document serves as a record of services personally performed by Eppie Gibson, MD. It was created on his behalf by Clerance Lav, a trained medical scribe. The creation of this record is based on the scribe's personal observations and the provider's statements to them. This document has been checked and approved by the attending provider.

## 2020-11-02 ENCOUNTER — Ambulatory Visit: Payer: Medicare PPO | Admitting: Radiation Oncology

## 2020-11-02 DIAGNOSIS — Z170 Estrogen receptor positive status [ER+]: Secondary | ICD-10-CM | POA: Diagnosis not present

## 2020-11-02 DIAGNOSIS — C50311 Malignant neoplasm of lower-inner quadrant of right female breast: Secondary | ICD-10-CM | POA: Diagnosis not present

## 2020-11-02 DIAGNOSIS — Z510 Encounter for antineoplastic radiation therapy: Secondary | ICD-10-CM | POA: Diagnosis not present

## 2020-11-04 ENCOUNTER — Ambulatory Visit: Payer: Medicare PPO | Admitting: Radiation Oncology

## 2020-11-05 ENCOUNTER — Ambulatory Visit: Payer: Medicare PPO

## 2020-11-05 ENCOUNTER — Encounter: Payer: Self-pay | Admitting: *Deleted

## 2020-11-07 ENCOUNTER — Other Ambulatory Visit: Payer: Self-pay

## 2020-11-07 ENCOUNTER — Ambulatory Visit
Admission: RE | Admit: 2020-11-07 | Discharge: 2020-11-07 | Disposition: A | Discharge status: Home / Self Care | Payer: Medicare PPO | Source: Ambulatory Visit | Attending: Radiation Oncology | Admitting: Radiation Oncology

## 2020-11-07 DIAGNOSIS — Z170 Estrogen receptor positive status [ER+]: Secondary | ICD-10-CM | POA: Diagnosis not present

## 2020-11-07 DIAGNOSIS — C50311 Malignant neoplasm of lower-inner quadrant of right female breast: Secondary | ICD-10-CM | POA: Diagnosis not present

## 2020-11-07 DIAGNOSIS — Z510 Encounter for antineoplastic radiation therapy: Secondary | ICD-10-CM | POA: Diagnosis not present

## 2020-11-08 ENCOUNTER — Other Ambulatory Visit: Payer: Self-pay

## 2020-11-08 ENCOUNTER — Telehealth: Payer: Self-pay | Admitting: Radiation Oncology

## 2020-11-08 ENCOUNTER — Ambulatory Visit
Admission: RE | Admit: 2020-11-08 | Discharge: 2020-11-08 | Disposition: A | Discharge status: Home / Self Care | Payer: Medicare PPO | Source: Ambulatory Visit | Attending: Radiation Oncology | Admitting: Radiation Oncology

## 2020-11-08 DIAGNOSIS — C50311 Malignant neoplasm of lower-inner quadrant of right female breast: Secondary | ICD-10-CM

## 2020-11-08 DIAGNOSIS — Z510 Encounter for antineoplastic radiation therapy: Secondary | ICD-10-CM | POA: Diagnosis not present

## 2020-11-08 DIAGNOSIS — Z170 Estrogen receptor positive status [ER+]: Secondary | ICD-10-CM

## 2020-11-08 DIAGNOSIS — C50211 Malignant neoplasm of upper-inner quadrant of right female breast: Secondary | ICD-10-CM

## 2020-11-08 MED ORDER — RADIAPLEXRX EX GEL: Freq: Once | CUTANEOUS | Status: AC

## 2020-11-08 MED ORDER — ALRA NON-METALLIC DEODORANT (RAD-ONC)
1.0000 "application " | Freq: Once | TOPICAL | Status: AC
  Administered 2020-11-08: 1 via TOPICAL

## 2020-11-08 NOTE — Telephone Encounter (Signed)
Estill Cotta called on her mom's wrong appointments for radiation oncology. I have sent the double appointments that are showing to our IT team and our management to correct.

## 2020-11-08 NOTE — Progress Notes (Signed)

## 2020-11-09 ENCOUNTER — Ambulatory Visit
Admission: RE | Admit: 2020-11-09 | Discharge: 2020-11-09 | Disposition: A | Discharge status: Home / Self Care | Payer: Medicare PPO | Source: Ambulatory Visit | Attending: Radiation Oncology | Admitting: Radiation Oncology

## 2020-11-09 ENCOUNTER — Telehealth: Payer: Self-pay | Admitting: Hematology

## 2020-11-09 ENCOUNTER — Other Ambulatory Visit: Payer: Self-pay

## 2020-11-09 DIAGNOSIS — Z170 Estrogen receptor positive status [ER+]: Secondary | ICD-10-CM | POA: Diagnosis not present

## 2020-11-09 DIAGNOSIS — Z510 Encounter for antineoplastic radiation therapy: Secondary | ICD-10-CM | POA: Diagnosis not present

## 2020-11-09 DIAGNOSIS — C50311 Malignant neoplasm of lower-inner quadrant of right female breast: Secondary | ICD-10-CM | POA: Diagnosis not present

## 2020-11-09 NOTE — Telephone Encounter (Signed)
Scheduled appts per 11/19 sch msg. Pt confirmed appt date and time.

## 2020-11-10 ENCOUNTER — Ambulatory Visit
Admission: RE | Admit: 2020-11-10 | Discharge: 2020-11-10 | Disposition: A | Discharge status: Home / Self Care | Payer: Medicare PPO | Source: Ambulatory Visit | Attending: Radiation Oncology | Admitting: Radiation Oncology

## 2020-11-10 ENCOUNTER — Other Ambulatory Visit: Payer: Self-pay

## 2020-11-10 DIAGNOSIS — Z510 Encounter for antineoplastic radiation therapy: Secondary | ICD-10-CM | POA: Diagnosis not present

## 2020-11-10 DIAGNOSIS — C50311 Malignant neoplasm of lower-inner quadrant of right female breast: Secondary | ICD-10-CM | POA: Diagnosis not present

## 2020-11-10 DIAGNOSIS — Z170 Estrogen receptor positive status [ER+]: Secondary | ICD-10-CM | POA: Diagnosis not present

## 2020-11-15 ENCOUNTER — Other Ambulatory Visit: Payer: Self-pay

## 2020-11-15 ENCOUNTER — Ambulatory Visit
Admission: RE | Admit: 2020-11-15 | Discharge: 2020-11-15 | Disposition: A | Discharge status: Home / Self Care | Payer: Medicare PPO | Source: Ambulatory Visit | Attending: Radiation Oncology | Admitting: Radiation Oncology

## 2020-11-15 DIAGNOSIS — C50311 Malignant neoplasm of lower-inner quadrant of right female breast: Secondary | ICD-10-CM | POA: Diagnosis not present

## 2020-11-15 DIAGNOSIS — Z510 Encounter for antineoplastic radiation therapy: Secondary | ICD-10-CM | POA: Diagnosis not present

## 2020-11-15 DIAGNOSIS — Z170 Estrogen receptor positive status [ER+]: Secondary | ICD-10-CM | POA: Diagnosis not present

## 2020-11-16 ENCOUNTER — Ambulatory Visit
Admission: RE | Admit: 2020-11-16 | Discharge: 2020-11-16 | Disposition: A | Discharge status: Home / Self Care | Payer: Medicare PPO | Source: Ambulatory Visit | Attending: Radiation Oncology | Admitting: Radiation Oncology

## 2020-11-16 ENCOUNTER — Other Ambulatory Visit: Payer: Self-pay

## 2020-11-16 DIAGNOSIS — Z510 Encounter for antineoplastic radiation therapy: Secondary | ICD-10-CM | POA: Diagnosis not present

## 2020-11-16 DIAGNOSIS — Z170 Estrogen receptor positive status [ER+]: Secondary | ICD-10-CM | POA: Diagnosis not present

## 2020-11-16 DIAGNOSIS — C50311 Malignant neoplasm of lower-inner quadrant of right female breast: Secondary | ICD-10-CM | POA: Diagnosis not present

## 2020-11-17 ENCOUNTER — Ambulatory Visit
Admission: RE | Admit: 2020-11-17 | Discharge: 2020-11-17 | Disposition: A | Discharge status: Home / Self Care | Payer: Medicare PPO | Source: Ambulatory Visit | Attending: Radiation Oncology | Admitting: Radiation Oncology

## 2020-11-17 ENCOUNTER — Other Ambulatory Visit: Payer: Self-pay

## 2020-11-17 DIAGNOSIS — Z170 Estrogen receptor positive status [ER+]: Secondary | ICD-10-CM | POA: Diagnosis not present

## 2020-11-17 DIAGNOSIS — C50211 Malignant neoplasm of upper-inner quadrant of right female breast: Secondary | ICD-10-CM | POA: Insufficient documentation

## 2020-11-17 DIAGNOSIS — F410 Panic disorder [episodic paroxysmal anxiety] without agoraphobia: Secondary | ICD-10-CM | POA: Diagnosis not present

## 2020-11-17 DIAGNOSIS — C50311 Malignant neoplasm of lower-inner quadrant of right female breast: Secondary | ICD-10-CM | POA: Insufficient documentation

## 2020-11-17 DIAGNOSIS — F331 Major depressive disorder, recurrent, moderate: Secondary | ICD-10-CM | POA: Diagnosis not present

## 2020-11-18 ENCOUNTER — Ambulatory Visit
Admission: RE | Admit: 2020-11-18 | Discharge: 2020-11-18 | Disposition: A | Discharge status: Home / Self Care | Payer: Medicare PPO | Source: Ambulatory Visit | Attending: Radiation Oncology | Admitting: Radiation Oncology

## 2020-11-18 DIAGNOSIS — C50311 Malignant neoplasm of lower-inner quadrant of right female breast: Secondary | ICD-10-CM | POA: Diagnosis not present

## 2020-11-18 DIAGNOSIS — Z170 Estrogen receptor positive status [ER+]: Secondary | ICD-10-CM | POA: Diagnosis not present

## 2020-11-18 DIAGNOSIS — C50211 Malignant neoplasm of upper-inner quadrant of right female breast: Secondary | ICD-10-CM | POA: Diagnosis not present

## 2020-11-19 ENCOUNTER — Other Ambulatory Visit: Payer: Self-pay

## 2020-11-19 ENCOUNTER — Ambulatory Visit
Admission: RE | Admit: 2020-11-19 | Discharge: 2020-11-19 | Disposition: A | Discharge status: Home / Self Care | Payer: Medicare PPO | Source: Ambulatory Visit | Attending: Radiation Oncology | Admitting: Radiation Oncology

## 2020-11-19 DIAGNOSIS — C50211 Malignant neoplasm of upper-inner quadrant of right female breast: Secondary | ICD-10-CM | POA: Diagnosis not present

## 2020-11-19 DIAGNOSIS — C50311 Malignant neoplasm of lower-inner quadrant of right female breast: Secondary | ICD-10-CM | POA: Diagnosis not present

## 2020-11-19 DIAGNOSIS — Z170 Estrogen receptor positive status [ER+]: Secondary | ICD-10-CM | POA: Diagnosis not present

## 2020-11-22 ENCOUNTER — Ambulatory Visit
Admission: RE | Admit: 2020-11-22 | Discharge: 2020-11-22 | Disposition: A | Discharge status: Home / Self Care | Payer: Medicare PPO | Source: Ambulatory Visit | Attending: Radiation Oncology | Admitting: Radiation Oncology

## 2020-11-22 ENCOUNTER — Other Ambulatory Visit: Payer: Self-pay

## 2020-11-22 ENCOUNTER — Ambulatory Visit: Payer: Medicare PPO | Admitting: Radiation Oncology

## 2020-11-22 DIAGNOSIS — Z170 Estrogen receptor positive status [ER+]: Secondary | ICD-10-CM | POA: Diagnosis not present

## 2020-11-22 DIAGNOSIS — C50311 Malignant neoplasm of lower-inner quadrant of right female breast: Secondary | ICD-10-CM | POA: Diagnosis not present

## 2020-11-22 DIAGNOSIS — C50211 Malignant neoplasm of upper-inner quadrant of right female breast: Secondary | ICD-10-CM | POA: Diagnosis not present

## 2020-11-22 MED ORDER — RADIAPLEXRX EX GEL: Freq: Once | CUTANEOUS | Status: AC

## 2020-11-23 ENCOUNTER — Ambulatory Visit
Admission: RE | Admit: 2020-11-23 | Discharge: 2020-11-23 | Disposition: A | Discharge status: Home / Self Care | Payer: Medicare PPO | Source: Ambulatory Visit | Attending: Radiation Oncology | Admitting: Radiation Oncology

## 2020-11-23 ENCOUNTER — Ambulatory Visit: Payer: Medicare PPO | Admitting: Radiation Oncology

## 2020-11-23 DIAGNOSIS — C50211 Malignant neoplasm of upper-inner quadrant of right female breast: Secondary | ICD-10-CM | POA: Diagnosis not present

## 2020-11-23 DIAGNOSIS — C50311 Malignant neoplasm of lower-inner quadrant of right female breast: Secondary | ICD-10-CM | POA: Diagnosis not present

## 2020-11-23 DIAGNOSIS — Z170 Estrogen receptor positive status [ER+]: Secondary | ICD-10-CM | POA: Diagnosis not present

## 2020-11-24 ENCOUNTER — Other Ambulatory Visit: Payer: Self-pay

## 2020-11-24 ENCOUNTER — Ambulatory Visit
Admission: RE | Admit: 2020-11-24 | Discharge: 2020-11-24 | Disposition: A | Discharge status: Home / Self Care | Payer: Medicare PPO | Source: Ambulatory Visit | Attending: Radiation Oncology | Admitting: Radiation Oncology

## 2020-11-24 DIAGNOSIS — Z170 Estrogen receptor positive status [ER+]: Secondary | ICD-10-CM | POA: Diagnosis not present

## 2020-11-24 DIAGNOSIS — C50211 Malignant neoplasm of upper-inner quadrant of right female breast: Secondary | ICD-10-CM | POA: Diagnosis not present

## 2020-11-24 DIAGNOSIS — C50311 Malignant neoplasm of lower-inner quadrant of right female breast: Secondary | ICD-10-CM | POA: Diagnosis not present

## 2020-11-25 ENCOUNTER — Other Ambulatory Visit: Payer: Self-pay

## 2020-11-25 ENCOUNTER — Ambulatory Visit
Admission: RE | Admit: 2020-11-25 | Discharge: 2020-11-25 | Disposition: A | Discharge status: Home / Self Care | Payer: Medicare PPO | Source: Ambulatory Visit | Attending: Radiation Oncology | Admitting: Radiation Oncology

## 2020-11-25 DIAGNOSIS — C50311 Malignant neoplasm of lower-inner quadrant of right female breast: Secondary | ICD-10-CM | POA: Diagnosis not present

## 2020-11-25 DIAGNOSIS — C50211 Malignant neoplasm of upper-inner quadrant of right female breast: Secondary | ICD-10-CM | POA: Diagnosis not present

## 2020-11-25 DIAGNOSIS — Z170 Estrogen receptor positive status [ER+]: Secondary | ICD-10-CM | POA: Diagnosis not present

## 2020-11-26 ENCOUNTER — Ambulatory Visit
Admission: RE | Admit: 2020-11-26 | Discharge: 2020-11-26 | Disposition: A | Discharge status: Home / Self Care | Payer: Medicare PPO | Source: Ambulatory Visit | Attending: Radiation Oncology | Admitting: Radiation Oncology

## 2020-11-26 DIAGNOSIS — C50211 Malignant neoplasm of upper-inner quadrant of right female breast: Secondary | ICD-10-CM | POA: Diagnosis not present

## 2020-11-26 DIAGNOSIS — C50311 Malignant neoplasm of lower-inner quadrant of right female breast: Secondary | ICD-10-CM | POA: Diagnosis not present

## 2020-11-26 DIAGNOSIS — Z170 Estrogen receptor positive status [ER+]: Secondary | ICD-10-CM | POA: Diagnosis not present

## 2020-11-29 ENCOUNTER — Ambulatory Visit
Admission: RE | Admit: 2020-11-29 | Discharge: 2020-11-29 | Disposition: A | Discharge status: Home / Self Care | Payer: Medicare PPO | Source: Ambulatory Visit | Attending: Radiation Oncology | Admitting: Radiation Oncology

## 2020-11-29 ENCOUNTER — Ambulatory Visit: Payer: Medicare PPO | Admitting: Radiation Oncology

## 2020-11-29 ENCOUNTER — Other Ambulatory Visit: Payer: Self-pay

## 2020-11-29 DIAGNOSIS — C50311 Malignant neoplasm of lower-inner quadrant of right female breast: Secondary | ICD-10-CM | POA: Diagnosis not present

## 2020-11-29 DIAGNOSIS — C50211 Malignant neoplasm of upper-inner quadrant of right female breast: Secondary | ICD-10-CM | POA: Diagnosis not present

## 2020-11-29 DIAGNOSIS — Z170 Estrogen receptor positive status [ER+]: Secondary | ICD-10-CM | POA: Diagnosis not present

## 2020-11-30 ENCOUNTER — Ambulatory Visit
Admission: RE | Admit: 2020-11-30 | Discharge: 2020-11-30 | Disposition: A | Discharge status: Home / Self Care | Payer: Medicare PPO | Source: Ambulatory Visit | Attending: Radiation Oncology | Admitting: Radiation Oncology

## 2020-11-30 DIAGNOSIS — C50211 Malignant neoplasm of upper-inner quadrant of right female breast: Secondary | ICD-10-CM | POA: Diagnosis not present

## 2020-11-30 DIAGNOSIS — C50311 Malignant neoplasm of lower-inner quadrant of right female breast: Secondary | ICD-10-CM | POA: Diagnosis not present

## 2020-11-30 DIAGNOSIS — Z170 Estrogen receptor positive status [ER+]: Secondary | ICD-10-CM | POA: Diagnosis not present

## 2020-12-01 ENCOUNTER — Ambulatory Visit
Admission: RE | Admit: 2020-12-01 | Discharge: 2020-12-01 | Disposition: A | Discharge status: Home / Self Care | Payer: Medicare PPO | Source: Ambulatory Visit | Attending: Radiation Oncology | Admitting: Radiation Oncology

## 2020-12-01 DIAGNOSIS — Z170 Estrogen receptor positive status [ER+]: Secondary | ICD-10-CM | POA: Diagnosis not present

## 2020-12-01 DIAGNOSIS — C50211 Malignant neoplasm of upper-inner quadrant of right female breast: Secondary | ICD-10-CM | POA: Diagnosis not present

## 2020-12-01 DIAGNOSIS — C50311 Malignant neoplasm of lower-inner quadrant of right female breast: Secondary | ICD-10-CM | POA: Diagnosis not present

## 2020-12-01 MED ORDER — RADIAPLEXRX EX GEL: Freq: Once | CUTANEOUS | Status: AC

## 2020-12-01 NOTE — Progress Notes (Signed)
[name]   [phone] [phone]   Clinic Follow up Note   Patient Care Team: McNeill, Wendy, MD as PCP - General (Family Medicine) Stuart, Dawn C, RN as Oncology Nurse Navigator Martini, Keisha N, RN as Oncology Nurse Navigator Toth, Paul III, MD as Consulting Physician (General Surgery) Feng, Yan, MD as Consulting Physician (Hematology) Squire, Sarah, MD as Attending Physician (Radiation Oncology)  Date of Service:  12/03/2020  CHIEF COMPLAINT: F/u of right breast cancer   SUMMARY OF ONCOLOGIC HISTORY: Oncology History Overview Note  Cancer Staging Malignant neoplasm of upper-inner quadrant of right breast in female, estrogen receptor positive ([facility]) Staging form: Breast, AJCC 8th Edition - Clinical stage from 08/31/2020: Stage IA (cT1b, cN0, cM0, G2, ER+, PR+, HER2-) - Signed by Feng, Yan, MD on 09/07/2020    Malignant neoplasm of upper-inner quadrant of right breast in female, estrogen receptor positive ([facility])  08/02/2020 Mammogram   IMPRESSION: Interval increase in size of an asymmetry in the slightly medial right breast. Patient was unable to tolerate an mL view today due to a rash underneath her breast. There is a possible subtle sonographic correlate at 4:30 o'clock 6 cm from the nipple. This measures 0.9 x 0.7 x 0.6 cm.   08/31/2020 Cancer Staging   Staging form: Breast, AJCC 8th Edition - Clinical stage from 08/31/2020: Stage IA (cT1b, cN0, cM0, G2, ER+, PR+, HER2-) - Signed by Feng, Yan, MD on 09/07/2020   08/31/2020 Initial Biopsy   Diagnosis Breast, right, needle core biopsy, LIQ, coil clip - INVASIVE DUCTAL CARCINOMA - SEE COMMENT Microscopic Comment Based on the biopsy, the carcinoma appears Nottingham grade 2 of 3, has foci of extracellular mucin and measures 1 cm in greatest linear extent. Prognostic markers (ER/PR/ki-67/HER2) are pending and will be reported in an addendum. Dr. Canacci reviewed the case and agrees with the  above diagnosis. These results were called to [facility] on September 01, 2020.   08/31/2020 Receptors her2   ADDITIONAL INFORMATION: Her2 is NEGATIVE (0).   Results: IMMUNOHISTOCHEMICAL AND MORPHOMETRIC ANALYSIS PERFORMED MANUALLY Estrogen Receptor: 95%, POSITIVE, STRONG STAINING INTENSITY Progesterone Receptor: 95%, POSITIVE, STRONG STAINING INTENSITY Proliferation Marker Ki67: 10%   09/03/2020 Initial Diagnosis   Malignant neoplasm of upper-inner quadrant of right breast in female, estrogen receptor positive ([facility])   09/30/2020 Surgery   RIGHT BREAST LUMPECTOMY WITH RADIOACTIVE SEED LOCALIZATION by Dr Toth   09/30/2020 Pathology Results   FINAL MICROSCOPIC DIAGNOSIS:   A. BREAST, RIGHT, LUMPECTOMY:  - Invasive ductal carcinoma with extracellular mucin, 1.3 cm, grade 2  - Ductal carcinoma in situ low-grade  - Resection margins are negative for invasive carcinoma; closest  anterior margin at less than 1 mm and lateral margin at 2 mm  - DCIS focally involves the anterior margin and is 1 mm from the lateral  margin  - Negative for lymphovascular perineural invasion  - Biopsy site changes  - See oncology table    11/07/2020 - 12/06/2020 Radiation Therapy   Adjuvant Radiation with Dr Squire    12/2020 -  Anti-estrogen oral therapy   Tamoxifen 20mg once daily starting in 12/2020. Will start at 10mg to see if her mood is manageable.       CURRENT THERAPY:  Adjuvant Radiation 11/07/20-12/06/20 Tamoxifen 20mg once daily starting in 12/2020. Will start at 10mg to see if her mood is manageable.    INTERVAL HISTORY:  [name] is here for a follow up. She presents to the   clinic with her daughter. She notes she is doing well. She notes she is tolerating radiation well mostly. She did have skin rash that is irritated by RT. She is using cream and hydrocortisone. She also has occasional nausea and fatigue from RT which is manageable. She notes in the last week  she has felt much better.  Her prior rib fractures are healing, but she notes nausea when she lays on that side. She notes she has been on Wellbutrin for a long time for her depression. She was on Paxil for a long time before that. She is also currently on Lexapro. She notes she is SOB due to her COPD.  She notes 1 episode of walking backwards in her kitchen which she did not realize and almost bumped into her stool and fell. This has not occurred since.    REVIEW OF SYSTEMS:   Constitutional: Denies fevers, chills or abnormal weight loss (+) Fatigue  Eyes: Denies blurriness of vision Ears, nose, mouth, throat, and face: Denies mucositis or sore throat Respiratory: Denies cough or wheezes (+) COPD/SOB Cardiovascular: Denies palpitation, chest discomfort or lower extremity swelling Gastrointestinal:  Denies nausea, heartburn or change in bowel habits(+) Occasional nausea  Skin: (+) Skin rash, irritated around chest  Lymphatics: Denies new lymphadenopathy or easy bruising Neurological:Denies numbness, tingling or new weaknesses  Behavioral/Psych: Mood is stable, no new changes (+) Manageable depression  All other systems were reviewed with the patient and are negative.  MEDICAL HISTORY:  Past Medical History:  Diagnosis Date  . Anxiety   . Back pain   . Cancer (HCC)   . Depression   . Emphysema   . Esophageal spasm   . Lung nodule   . OSA (obstructive sleep apnea)   . Osteomalacia   . Vertigo   . Vitamin D deficiency     SURGICAL HISTORY: Past Surgical History:  Procedure Laterality Date  . ABDOMINAL HYSTERECTOMY    . APPENDECTOMY    . BREAST EXCISIONAL BIOPSY Left 1978  . BREAST LUMPECTOMY WITH RADIOACTIVE SEED LOCALIZATION Right 09/30/2020   Procedure: RIGHT BREAST LUMPECTOMY WITH RADIOACTIVE SEED LOCALIZATION;  Surgeon: Toth, Paul III, MD;  Location: [facility];  Service: General;  Laterality: Right;  . CARPAL TUNNEL RELEASE    . ROTATOR CUFF REPAIR       I have reviewed the social history and family history with the patient and they are unchanged from previous note.  ALLERGIES:  is allergic to adhesive [tape] and latex.  MEDICATIONS:  Current Outpatient Medications  Medication Sig Dispense Refill  . acetaminophen (TYLENOL 8 HOUR) 650 MG CR tablet Take 650 mg by mouth 2 (two) times daily as needed for pain.     . albuterol (PROVENTIL HFA;VENTOLIN HFA) 108 (90 Base) MCG/ACT inhaler Inhale 2 puffs into the lungs every 6 (six) hours as needed for wheezing or shortness of breath. (Patient not taking: Reported on 10/29/2020)    . ALPRAZolam (XANAX) 0.25 MG tablet Take 0.25 mg by mouth daily as needed. (Patient not taking: Reported on 10/29/2020)    . aspirin EC 81 MG tablet Take 81 mg by mouth daily. (Patient not taking: Reported on 10/29/2020)    . atorvastatin (LIPITOR) 10 MG tablet Take 10 mg by mouth at bedtime.    . buPROPion (WELLBUTRIN XL) 150 MG 24 hr tablet Take 150 mg by mouth daily.     . Cholecalciferol (VITAMIN D) 2000 units tablet Take 4,000 Units by mouth daily.     .   escitalopram (LEXAPRO) 20 MG tablet Take 40 mg by mouth daily.  2  . fluticasone (FLONASE) 50 MCG/ACT nasal spray Place 2 sprays into both nostrils daily.    . HYDROcodone-acetaminophen (NORCO/VICODIN) 5-325 MG tablet Take 1-2 tablets by mouth every 6 (six) hours as needed for moderate pain or severe pain. (Patient not taking: Reported on 10/29/2020) 10 tablet 0  . Multiple Vitamin (MULTIVITAMIN WITH MINERALS) TABS tablet Take 1 tablet by mouth daily.    . omeprazole (PRILOSEC) 40 MG capsule Take 40 mg by mouth daily.    . Polyethyl Glycol-Propyl Glycol (SYSTANE) 0.4-0.3 % SOLN Place 1 drop into both eyes daily as needed (dry eyes).    . tamoxifen (NOLVADEX) 20 MG tablet Take 1 tablet (20 mg total) by mouth daily. 30 tablet 2  . tiotropium (SPIRIVA) 18 MCG inhalation capsule Place 18 mcg into inhaler and inhale daily.     No current facility-administered  medications for this visit.    PHYSICAL EXAMINATION: ECOG PERFORMANCE STATUS: 1 - Symptomatic but completely ambulatory  Vitals:   12/03/20 1420  BP: (!) 171/73  Pulse: 76  Resp: 18  Temp: 97.8 F (36.6 C)  SpO2: 97%   Filed Weights   12/03/20 1420  Weight: 159 lb 8 oz (72.3 kg)    Due to COVID19 we will limit examination to appearance. Patient had no complaints.  GENERAL:alert, no distress and comfortable SKIN: skin color normal, no rashes or significant lesions EYES: normal, Conjunctiva are pink and non-injected, sclera clear  NEURO: alert & oriented x 3 with fluent speech   LABORATORY DATA:  I have reviewed the data as listed CBC Latest Ref Rng & Units 09/08/2020 03/14/2020 03/14/2020  WBC 4.0 - 10.5 K/uL 7.9 - 9.4  Hemoglobin 12.0 - 15.0 g/dL 13.6 14.3 14.5  Hematocrit 36.0 - 46.0 % 41.6 42.0 43.6  Platelets 150 - 400 K/uL 164 - 162     CMP Latest Ref Rng & Units 09/08/2020 03/14/2020 09/15/2017  Glucose 70 - 99 mg/dL 94 102(H) 117(H)  BUN 8 - 23 mg/dL 13 15 14  Creatinine 0.44 - 1.00 mg/dL 0.80 0.60 0.73  Sodium 135 - 145 mmol/L 138 140 138  Potassium 3.5 - 5.1 mmol/L 4.1 4.1 3.6  Chloride 98 - 111 mmol/L 101 106 104  CO2 22 - 32 mmol/L 32 - 26  Calcium 8.9 - 10.3 mg/dL 9.7 - 8.9  Total Protein 6.5 - 8.1 g/dL 6.9 - -  Total Bilirubin 0.3 - 1.2 mg/dL 0.5 - -  Alkaline Phos 38 - 126 U/L 74 - -  AST 15 - 41 U/L 15 - -  ALT 0 - 44 U/L 14 - -      RADIOGRAPHIC STUDIES: I have personally reviewed the radiological images as listed and agreed with the findings in the report. No results found.   ASSESSMENT & PLAN:  Nica T Chizmar is a 86 y.o. female with    1.Malignant neoplasm of upper-inner quadrant of right breast, StageIA, c(T1bN0M0), ER+/PR+/HER2-, GradeII -She was diagnosed in 08/2020. She underwent right breast lumpectomy with Dr Toth on 09/30/20. Her surgical pathology showed her 1.3cm invasive cancer, G2, with components of low grade DCIS was  completely removed except positive anterior margins with DCIS.   -Standard care for positive margins would include re-excision surgery to get negative margin. However due to her advanced age and low grade DCIS only on positive margin, we think it's reasonable to avoid second surgery.  -She instead proceeded with Adjuvant Radiation   with Dr Isidore Moos on 11/07/20. Plan to complete 12/06/20. She is tolerating well with manageable nausea, fatigue and skin irration of current skin rash.  -Given the strong ER and PR expression in her postmenopausal status, I recommend antiestrogen therapy with Tamoxifen. I reviewed side effects with her. Given her depression, will start at 5m to see if tolerable. She is agreeable.  -due to interaction between tamoxifen and Wellbutrin, she will discuss with her PCP to wean off Wellbutrin  -She will proceed with Survivorship clinic with NP Laice in 3 months. F/u with me in 6 months.    2. History of falls -She has had fall in the past year. Her worst fall was in 02/2020 which results in rib fracture withpneumothoraxand interrupted her healing with another fall in 06/2020.  -She ambulates with walker and cane now and uses medical alert necklace. She was very active at home with chores, but not much lately due to concern with another fall.  -She had DEXA years ago, I recommend a new baseline, she is agreeable.    3. Comorbidities: COPD, Esophageal spasms, heart murmur, PAD, Arthritis, Depression -On Wellbutrin and Lexapro for longterm occasional depression. -She has arthritis in her hands but mainly in her back. She has received injections before, now controlled on extended release Tylenol BID.  -Continue to F/u with PCP and vascular surgeon.  -Given she will start Tamoxifen soon, I discussed drug interaction with Wellbutrin. I discussed reducing to low dose or weaning off. If her mood worsens on Tamoxifen we can stop.    PLAN: -I called in Tamoxifen 24mtoday.  Plan to start at 1071maily in 12/2020 to see how she tolerates  -she will call her PCP to wean off Wellbutrin  -phone visit in 6 weeks  -Survivorship clinic with NP Lacie in 3 months  -lab and f/u with me in 6 months    No problem-specific Assessment & Plan notes found for this encounter.   No orders of the defined types were placed in this encounter.  All questions were answered. The patient knows to call the clinic with any problems, questions or concerns. No barriers to learning was detected. The total time spent in the appointment was 30 minutes.     YanTruitt MerleD 12/03/2020   I, AmoJoslyn Devonm acting as scribe for YanTruitt MerleD.   I have reviewed the above documentation for accuracy and completeness, and I agree with the above.

## 2020-12-02 ENCOUNTER — Ambulatory Visit
Admission: RE | Admit: 2020-12-02 | Discharge: 2020-12-02 | Disposition: A | Discharge status: Home / Self Care | Payer: Medicare PPO | Source: Ambulatory Visit | Attending: Radiation Oncology | Admitting: Radiation Oncology

## 2020-12-02 DIAGNOSIS — C50211 Malignant neoplasm of upper-inner quadrant of right female breast: Secondary | ICD-10-CM | POA: Diagnosis not present

## 2020-12-02 DIAGNOSIS — Z170 Estrogen receptor positive status [ER+]: Secondary | ICD-10-CM | POA: Diagnosis not present

## 2020-12-02 DIAGNOSIS — C50311 Malignant neoplasm of lower-inner quadrant of right female breast: Secondary | ICD-10-CM | POA: Diagnosis not present

## 2020-12-03 ENCOUNTER — Telehealth: Payer: Self-pay | Admitting: Hematology

## 2020-12-03 ENCOUNTER — Encounter: Payer: Self-pay | Admitting: Hematology

## 2020-12-03 ENCOUNTER — Ambulatory Visit: Payer: Medicare PPO

## 2020-12-03 ENCOUNTER — Other Ambulatory Visit: Payer: Self-pay

## 2020-12-03 ENCOUNTER — Inpatient Hospital Stay: Payer: Medicare PPO | Attending: Hematology | Admitting: Hematology

## 2020-12-03 ENCOUNTER — Ambulatory Visit
Admission: RE | Admit: 2020-12-03 | Discharge: 2020-12-03 | Disposition: A | Discharge status: Home / Self Care | Payer: Medicare PPO | Source: Ambulatory Visit | Attending: Radiation Oncology | Admitting: Radiation Oncology

## 2020-12-03 VITALS — BP 171/73 | HR 76 | Temp 97.8°F | Resp 18 | Ht 61.0 in | Wt 159.5 lb

## 2020-12-03 DIAGNOSIS — F32A Depression, unspecified: Secondary | ICD-10-CM | POA: Diagnosis not present

## 2020-12-03 DIAGNOSIS — F419 Anxiety disorder, unspecified: Secondary | ICD-10-CM | POA: Diagnosis not present

## 2020-12-03 DIAGNOSIS — Z7982 Long term (current) use of aspirin: Secondary | ICD-10-CM | POA: Diagnosis not present

## 2020-12-03 DIAGNOSIS — Z79899 Other long term (current) drug therapy: Secondary | ICD-10-CM | POA: Insufficient documentation

## 2020-12-03 DIAGNOSIS — Z170 Estrogen receptor positive status [ER+]: Secondary | ICD-10-CM | POA: Insufficient documentation

## 2020-12-03 DIAGNOSIS — C50211 Malignant neoplasm of upper-inner quadrant of right female breast: Secondary | ICD-10-CM | POA: Diagnosis not present

## 2020-12-03 DIAGNOSIS — J449 Chronic obstructive pulmonary disease, unspecified: Secondary | ICD-10-CM | POA: Insufficient documentation

## 2020-12-03 DIAGNOSIS — G4733 Obstructive sleep apnea (adult) (pediatric): Secondary | ICD-10-CM | POA: Diagnosis not present

## 2020-12-03 DIAGNOSIS — C50311 Malignant neoplasm of lower-inner quadrant of right female breast: Secondary | ICD-10-CM | POA: Diagnosis not present

## 2020-12-03 MED ORDER — TAMOXIFEN CITRATE 20 MG PO TABS: 20.0000 mg | ORAL_TABLET | Freq: Every day | ORAL | 2 refills | Status: DC

## 2020-12-03 NOTE — Telephone Encounter (Signed)
Scheduled per los. Gave avs and calendar  

## 2020-12-06 ENCOUNTER — Other Ambulatory Visit: Payer: Self-pay

## 2020-12-06 ENCOUNTER — Encounter: Payer: Self-pay | Admitting: Radiation Oncology

## 2020-12-06 ENCOUNTER — Encounter: Payer: Self-pay | Admitting: *Deleted

## 2020-12-06 ENCOUNTER — Ambulatory Visit
Admission: RE | Admit: 2020-12-06 | Discharge: 2020-12-06 | Disposition: A | Discharge status: Home / Self Care | Payer: Medicare PPO | Source: Ambulatory Visit | Attending: Radiation Oncology | Admitting: Radiation Oncology

## 2020-12-06 DIAGNOSIS — C50211 Malignant neoplasm of upper-inner quadrant of right female breast: Secondary | ICD-10-CM

## 2020-12-06 DIAGNOSIS — C50311 Malignant neoplasm of lower-inner quadrant of right female breast: Secondary | ICD-10-CM | POA: Diagnosis not present

## 2020-12-06 DIAGNOSIS — Z170 Estrogen receptor positive status [ER+]: Secondary | ICD-10-CM | POA: Diagnosis not present

## 2020-12-06 MED ORDER — RADIAPLEXRX EX GEL: Freq: Once | CUTANEOUS | Status: AC

## 2020-12-09 NOTE — Progress Notes (Signed)
Patient Name: [name] MRN: [id number] DOB: [birth year] Referring Physician: Jovita Kussmaul (Profile Not Attached) Date of Service: 12/06/2020 [facility] [place]                                                        End Of Treatment Note  Diagnoses: [id number] neoplasm of lower-inner quadrant of right female breast  Cancer Staging: Cancer Staging Carcinoma of lower-inner quadrant of right breast in female, estrogen receptor positive ([place]) Staging form: Breast, AJCC 8th Edition - Pathologic: Stage Unknown (pT1c, pNX, cM0, G2, ER+, PR+, HER2-) - Signed by Eppie Gibson, MD on 10/29/2020  Malignant neoplasm of upper-inner quadrant of right breast in female, estrogen receptor positive ([place]) Staging form: Breast, AJCC 8th Edition - Clinical stage from 08/31/2020: Stage IA (cT1b, cN0, cM0, G2, ER+, PR+, HER2-) - Signed by Truitt Merle, MD on 09/07/2020   Intent: Curative  Radiation Treatment Dates: 11/07/2020 through 12/06/2020 Site Technique Total Dose (Gy) Dose per Fx (Gy) Completed Fx Beam Energies  Breast, Right: Breast_Rt_Axilla 3D 40.05/40.05 2.67 15/15 6X, 10X  Breast, Right: Breast_Rt_Bst specialPort 10/10 2 5/5 12E   Narrative: The patient tolerated radiation therapy relatively well.   Plan: The patient will follow-up with radiation oncology in 1 mo or as needed.  -----------------------------------  Eppie Gibson, MD

## 2020-12-29 ENCOUNTER — Telehealth: Payer: Self-pay

## 2020-12-29 NOTE — Telephone Encounter (Signed)
Received VM from patient's daughter Darrick Penna requesting a call back to address lingering skin issues since patient completed radiation.   Returned daughter's call to get more information. [name] stated that underneath patient's breast remains red and irritated, and she's not able to wear a bra. She said someone (she's not sure who) told patient to apply Vaseline to area, but that has only seemed to irritate the area more. Ruffin Frederick that patient should stop applying Vaseline to area, and instead try to get as much cool moving area to the area as possible (whether that's small sessions throughout the day or several hours at once). Instructed that a small portable fan may work best and patient may have to recline back and support breast so skin doesn't remain touching while getting air. While patient is wearing loose clothing and doing her daily activities, I advised daughter to place a non-adherent bandage in-between her breast and abdomen to help keep the skin from sticking and decrease chance of skin tears when removing bandage to allow air to area. Daughter denied any evidence of peeling to site, but I instructed that should patient have any desquamation to apply (Neosporin + pain relief) to help soothe and protect area. Should skin not improve by middle of next week, I advised daughter to call me back with an update so we can see if Dr. Isidore Moos would like to assess the area or have any other recommendations. Daughter verbalized understanding and agreement of plan, and denied any other needs at this time.

## 2021-01-12 NOTE — Progress Notes (Signed)
[facility]   [phone] [phone]   Clinic Follow up Note   Patient Care Team: Taylor, Wendy, MD as PCP - General (Family Medicine) Taylor, Kirsten C, RN as Oncology Nurse Navigator Taylor, Kirsten N, RN as Oncology Nurse Navigator Taylor, Kirsten III, MD as Consulting Physician (General Surgery) Taylor, Yan, MD as Consulting Physician (Hematology) Taylor, Sarah, MD as Attending Physician (Radiation Oncology)   I connected with [name] on 01/14/2021 at  2:20 PM EST by telephone visit and verified that I am speaking with the correct person using two identifiers.  I discussed the limitations, risks, security and privacy concerns of performing an evaluation and management service by telephone and the availability of in person appointments. I also discussed with the patient that there may be a patient responsible charge related to this service. The patient expressed understanding and agreed to proceed.   Other persons participating in the visit and their role in the encounter:  [name]  Patient's location:  Kirsten home  Provider's location:  My Office   CHIEF COMPLAINT: F/u of right breast cancer  SUMMARY OF ONCOLOGIC HISTORY: Oncology History Overview Note  Cancer Staging Malignant neoplasm of upper-inner quadrant of right breast in female, estrogen receptor positive (HCC) Staging form: Breast, AJCC 8th Edition - Clinical stage from 08/31/2020: Stage IA (cT1b, cN0, cM0, G2, ER+, PR+, HER2-) - Signed by Taylor, Yan, MD on 09/07/2020    Malignant neoplasm of upper-inner quadrant of right breast in female, estrogen receptor positive (HCC)  08/02/2020 Mammogram   IMPRESSION: Interval increase in size of an asymmetry in the slightly medial right breast. Patient was unable to tolerate an mL view today due to a rash underneath Kirsten breast. There is a possible subtle sonographic correlate at 4:30 o'clock 6 cm from the nipple. This measures 0.9 x 0.7 x  0.6 cm.   08/31/2020 Cancer Staging   Staging form: Breast, AJCC 8th Edition - Clinical stage from 08/31/2020: Stage IA (cT1b, cN0, cM0, G2, ER+, PR+, HER2-) - Signed by Taylor, Yan, MD on 09/07/2020   08/31/2020 Initial Biopsy   Diagnosis Breast, right, needle core biopsy, LIQ, coil clip - INVASIVE DUCTAL CARCINOMA - SEE COMMENT Microscopic Comment Based on the biopsy, the carcinoma appears Nottingham grade 2 of 3, has foci of extracellular mucin and measures 1 cm in greatest linear extent. Prognostic markers (ER/PR/ki-67/HER2) are pending and will be reported in an addendum. Dr. Canacci reviewed the case and agrees with the above diagnosis. These results were called to [facility]  on September 01, 2020.   08/31/2020 Receptors her2   ADDITIONAL INFORMATION: Her2 is NEGATIVE (0).   Results: IMMUNOHISTOCHEMICAL AND MORPHOMETRIC ANALYSIS PERFORMED MANUALLY Estrogen Receptor: 95%, POSITIVE, STRONG STAINING INTENSITY Progesterone Receptor: 95%, POSITIVE, STRONG STAINING INTENSITY Proliferation Marker Ki67: 10%   09/03/2020 Initial Diagnosis   Malignant neoplasm of upper-inner quadrant of right breast in female, estrogen receptor positive (HCC)   09/30/2020 Surgery   RIGHT BREAST LUMPECTOMY WITH RADIOACTIVE SEED LOCALIZATION by Dr Taylor   09/30/2020 Pathology Results   FINAL MICROSCOPIC DIAGNOSIS:   A. BREAST, RIGHT, LUMPECTOMY:  - Invasive ductal carcinoma with extracellular mucin, 1.3 cm, grade 2  - Ductal carcinoma in situ low-grade  - Resection margins are negative for invasive carcinoma; closest  anterior margin at less than 1 mm and lateral margin at 2 mm  - DCIS focally involves the anterior margin and is 1 mm from the lateral  margin  - Negative for   lymphovascular perineural invasion  - Biopsy site changes  - See oncology table    09/30/2020 Cancer Staging   Staging form: Breast, AJCC 8th Edition - Pathologic stage from 09/30/2020: Stage Unknown (pT1c,  pNX, cM0, G2, ER+, PR+, HER2-) - Signed by Kirsten Merle, MD on 01/13/2021   11/07/2020 - 12/06/2020 Radiation Therapy   Adjuvant Radiation with Dr [name]    12/2020 -  Anti-estrogen oral therapy   Tamoxifen 38m once daily starting in 12/2020.      CURRENT THERAPY:  Tamoxifen 297monce daily starting in 12/2020.  INTERVAL HISTORY:  FaLAURIANNE FLORESCAs here for a follow up. She notes she has taking 2065mt whole tablet for 2 weeks. She did come off Wellbutrin but has not changes to another medication. She may start new medication. She is overall doing well and denies any major changes. She tries to ambulate with walker of cone. She plans to f/u with Kirsten PCP next week.    REVIEW OF SYSTEMS:   Constitutional: Denies fevers, chills or abnormal weight loss Eyes: Denies blurriness of vision Ears, nose, mouth, throat, and face: Denies mucositis or sore throat Respiratory: Denies cough, dyspnea or wheezes Cardiovascular: Denies palpitation, chest discomfort or lower extremity swelling Gastrointestinal:  Denies nausea, heartburn or change in bowel habits Skin: Denies abnormal skin rashes Lymphatics: Denies new lymphadenopathy or easy bruising Neurological:Denies numbness, tingling or new weaknesses Behavioral/Psych: Mood is stable, no new changes  All other systems were reviewed with the patient and are negative.  MEDICAL HISTORY:  Past Medical History:  Diagnosis Date  . Anxiety   . Back pain   . Cancer (HCCHico . Depression   . Emphysema   . Esophageal spasm   . Lung nodule   . OSA (obstructive sleep apnea)   . Osteomalacia   . Vertigo   . Vitamin D deficiency     SURGICAL HISTORY: Past Surgical History:  Procedure Laterality Date  . ABDOMINAL HYSTERECTOMY    . APPENDECTOMY    . BREAST EXCISIONAL BIOPSY Left 1978  . BREAST LUMPECTOMY WITH RADIOACTIVE SEED LOCALIZATION Right 09/30/2020   Procedure: RIGHT BREAST LUMPECTOMY WITH RADIOACTIVE SEED LOCALIZATION;  Surgeon: TotJovita KussmaulD;  [facility]  Laterality: Right;  . CARPAL TUNNEL RELEASE    . ROTATOR CUFF REPAIR      I have reviewed the social history and family history with the patient and they are unchanged from previous note.  ALLERGIES:  is allergic to adhesive [tape] and latex.  MEDICATIONS:  Current Outpatient Medications  Medication Sig Dispense Refill  . acetaminophen (TYLENOL 8 HOUR) 650 MG CR tablet Take 650 mg by mouth 2 (two) times daily as needed for pain.     . aMarland Kitchenbuterol (PROVENTIL HFA;VENTOLIN HFA) 108 (90 Base) MCG/ACT inhaler Inhale 2 puffs into the lungs every 6 (six) hours as needed for wheezing or shortness of breath. (Patient not taking: Reported on 10/29/2020)    . ALPRAZolam (XANAX) 0.25 MG tablet Take 0.25 mg by mouth daily as needed. (Patient not taking: Reported on 10/29/2020)    . aspirin EC 81 MG tablet Take 81 mg by mouth daily. (Patient not taking: Reported on 10/29/2020)    . atorvastatin (LIPITOR) 10 MG tablet Take 10 mg by mouth at bedtime.    . bMarland KitchenPROPion (WELLBUTRIN XL) 150 MG 24 hr tablet Take 150 mg by mouth daily.     . Cholecalciferol (VITAMIN D) 2000 units tablet Take  4,000 Units by mouth daily.     Marland Kitchen escitalopram (LEXAPRO) 20 MG tablet Take 40 mg by mouth daily.  2  . fluticasone (FLONASE) 50 MCG/ACT nasal spray Place 2 sprays into both nostrils daily.    Marland Kitchen HYDROcodone-acetaminophen (NORCO/VICODIN) 5-325 MG tablet Take 1-2 tablets by mouth every 6 (six) hours as needed for moderate pain or severe pain. (Patient not taking: Reported on 10/29/2020) 10 tablet 0  . Multiple Vitamin (MULTIVITAMIN WITH MINERALS) TABS tablet Take 1 tablet by mouth daily.    Marland Kitchen omeprazole (PRILOSEC) 40 MG capsule Take 40 mg by mouth daily.    Vladimir Faster Glycol-Propyl Glycol (SYSTANE) 0.4-0.3 % SOLN Place 1 drop into both eyes daily as needed (dry eyes).    . tamoxifen (NOLVADEX) 20 MG tablet Take 1 tablet (20 mg total) by mouth daily. 30 tablet 2  .  tiotropium (SPIRIVA) 18 MCG inhalation capsule Place 18 mcg into inhaler and inhale daily.     No current facility-administered medications for this visit.    PHYSICAL EXAMINATION: ECOG PERFORMANCE STATUS: 1 - Symptomatic but completely ambulatory  No vitals taken today, Exam not performed today   LABORATORY DATA:  I have reviewed the data as listed CBC Latest Ref Rng & Units 09/08/2020 03/14/2020 03/14/2020  WBC 4.0 - 10.5 K/uL 7.9 - 9.4  Hemoglobin 12.0 - 15.0 g/dL 13.6 14.3 14.5  Hematocrit 36.0 - 46.0 % 41.6 42.0 43.6  Platelets 150 - 400 K/uL 164 - 162     CMP Latest Ref Rng & Units 09/08/2020 03/14/2020 09/15/2017  Glucose 70 - 99 mg/dL 94 102(H) 117(H)  BUN 8 - 23 mg/dL _0 Creatinine 0.44 - 1.00 mg/dL 0.80 0.60 0.73  Sodium 135 - 145 mmol/L 138 140 138  Potassium 3.5 - 5.1 mmol/L 4.1 4.1 3.6  Chloride 98 - 111 mmol/L 101 106 104  CO2 22 - 32 mmol/L 32 - 26  Calcium 8.9 - 10.3 mg/dL 9.7 - 8.9  Total Protein 6.5 - 8.1 g/dL 6.9 - -  Total Bilirubin 0.3 - 1.2 mg/dL 0.5 - -  Alkaline Phos 38 - 126 U/L 74 - -  AST 15 - 41 U/L 15 - -  ALT 0 - 44 U/L 14 - -      RADIOGRAPHIC STUDIES: I have personally reviewed the radiological images as listed and agreed with the findings in the report. No results found.   ASSESSMENT & PLAN:  [name] is a 87 y.o. female with   1.Malignant neoplasm of upper-inner quadrant of right breast, StageIA, P(T1cNxM0), ER+/PR+/HER2-, Levester Fresh -She was diagnosed in 08/2020. She underwent right breast lumpectomy with Dr Marlou Starks on 09/30/20. Kirsten surgicalpathology showed Kirsten 1.3cm invasive cancer, G2,with components of low grade DCIS was completely removed except positive anteriormarginswithDCIS.   -Standard care for positive margins would include re-excision surgeryto get negative margin.However due to Kirsten advancedageand low grade DCIS only on positive margin, we think it's reasonable to avoid second surgery.  -She completed Adjuvant  Radiation with Dr [name] 11/07/20-12/06/20.  -To reduce Kirsten risk of distant breast cancer, I started Kirsten on antiestrogen therapy with Tamoxifen starting in 12/2020. She is tolerating 12m daily well so far -Next visit is survivorship clinic with NP Lacie in March   2.History of falls -She has had fall in the past year. Kirsten worst fall was in 02/2020 which results in rib fracture withpneumothoraxand interrupted Kirsten healing with another fall in 06/2020.  -She ambulates with walkerand cane now and uses  medical alert necklace. She was very active at home with chores, but not much lately due to concern with another fall.  -She had DEXA years ago, I recommend a new baseline, she is agreeable.   3. Comorbidities: COPD, Esophageal spasms, heart murmur, PAD, Arthritis, Depression -She has been on Wellbutrin and Lexapro for longterm occasional depression. -She has arthritis in Kirsten hands but mainly in Kirsten back. She has received injections before, now controlled on extended release Tylenol BID.  -Continue to F/u withPCP and vascular surgeon.  -She encouraged Kirsten to f/u with PCP soon about starting new medication, now that she is off Wellbutrin.   PLAN: -Continue Tamoxifen 20mg daily  -Survivorship clinic with NP Lacie in March 2022    No problem-specific Assessment & Plan notes found for this encounter.   No orders of the defined types were placed in this encounter.  I discussed the assessment and treatment plan with the patient. The patient was provided an opportunity to ask questions and all were answered. The patient agreed with the plan and demonstrated an understanding of the instructions.  The patient was advised to call back or seek an in-person evaluation if the symptoms worsen or if the condition fails to improve as anticipated.  The total time spent in the appointment was 11 minutes.    Kirsten Feng, MD 01/14/2021   I, Amoya Bennett, am acting as scribe for Kirsten Feng, MD.   I have  reviewed the above documentation for accuracy and completeness, and I agree with the above.       

## 2021-01-13 ENCOUNTER — Inpatient Hospital Stay: Payer: Medicare PPO | Attending: Hematology | Admitting: Hematology

## 2021-01-13 DIAGNOSIS — C50211 Malignant neoplasm of upper-inner quadrant of right female breast: Secondary | ICD-10-CM | POA: Diagnosis not present

## 2021-01-13 DIAGNOSIS — Z170 Estrogen receptor positive status [ER+]: Secondary | ICD-10-CM

## 2021-01-14 ENCOUNTER — Ambulatory Visit
Admission: RE | Admit: 2021-01-14 | Discharge: 2021-01-14 | Disposition: A | Discharge status: Home / Self Care | Payer: Medicare PPO | Source: Ambulatory Visit | Attending: Radiation Oncology | Admitting: Radiation Oncology

## 2021-01-14 ENCOUNTER — Encounter: Payer: Self-pay | Admitting: Radiation Oncology

## 2021-01-14 ENCOUNTER — Encounter: Payer: Self-pay | Admitting: Hematology

## 2021-01-14 ENCOUNTER — Other Ambulatory Visit: Payer: Self-pay

## 2021-01-14 VITALS — BP 142/58 | HR 68 | Temp 97.3°F | Resp 18 | Ht 61.0 in | Wt 159.0 lb

## 2021-01-14 DIAGNOSIS — Z79899 Other long term (current) drug therapy: Secondary | ICD-10-CM | POA: Insufficient documentation

## 2021-01-14 DIAGNOSIS — Z7982 Long term (current) use of aspirin: Secondary | ICD-10-CM | POA: Insufficient documentation

## 2021-01-14 DIAGNOSIS — Z170 Estrogen receptor positive status [ER+]: Secondary | ICD-10-CM | POA: Insufficient documentation

## 2021-01-14 DIAGNOSIS — C50211 Malignant neoplasm of upper-inner quadrant of right female breast: Secondary | ICD-10-CM | POA: Diagnosis not present

## 2021-01-14 DIAGNOSIS — Z923 Personal history of irradiation: Secondary | ICD-10-CM | POA: Diagnosis not present

## 2021-01-14 NOTE — Progress Notes (Signed)
Ms. Malachi presents today for 1 month follow-up after completing radiation to her right breast on 12/06/2020  Pain: reports occasional sharp pains to the breast, but states they resolve quickly on their own Skin: has some residual redness/tenderness in fold underneath breast. Reports it is uncomfortable to wear a bra ROM: patient denies any stiffness or ROM issues to the right shoulder Lymphedema: reports mild swelling to right axilla MedOnc F/U: [place] with Hendricks Limes 03/03/2021 Other issues of note: Patient reports difficulty sleeping (getting in a comfortable position), so she struggles with fatigue. Because the skin under her breast is still sensitive, she is not able to wear a supportive bra comfortably, and therefore doesn't feel comfortable going out in public or doing daily acitvities. This has led her to feel down and cause a decrease in her appetite  Vitals:   01/14/21 1115  BP: (!) 142/58  Pulse: 68  Resp: 18  Temp: (!) 97.3 F (36.3 C)  SpO2: 97%   Wt Readings from Last 3 Encounters:  01/14/21 159 lb (72.1 kg)  12/03/20 159 lb 8 oz (72.3 kg)  10/11/20 159 lb 12.8 oz (72.5 kg)

## 2021-01-14 NOTE — Progress Notes (Signed)
Radiation Oncology         [phone] ________________________________  Name: [name] MRN: [id number]  Date: 01/14/2021  DOB: 26-Oct-1932  Follow-Up Visit Note  Outpatient  CC: Cari Caraway, MD  Autumn Messing III, MD  Diagnosis and Prior Radiotherapy:    ICD-10-CM   1. Malignant neoplasm of upper-inner quadrant of right breast in female, estrogen receptor positive ([place])  C50.211    Z17.0     CHIEF COMPLAINT: Here for follow-up and surveillance of breast cancer  Narrative:  The patient returns today for routine follow-up.   [name] presents today for 1 month follow-up after completing radiation to her right breast on 12/06/2020  Pain: reports occasional sharp pains to the breast, but states they resolve quickly on their own Skin: has some residual redness/tenderness in fold underneath breast. Reports it is uncomfortable to wear a bra ROM: patient denies any stiffness or ROM issues to the right shoulder Lymphedema: reports mild swelling to right axilla MedOnc F/U: [place] with Hendricks Limes 03/03/2021 Other issues of note: Patient reports difficulty sleeping (getting in a comfortable position), so she struggles with fatigue. Because the skin under her breast is still sensitive, she is not able to wear a supportive bra comfortably, and therefore doesn't feel comfortable going out in public or doing daily acitvities. This has led her to feel down and cause a decrease in her appetite.  She also acknowledges that her mood has been altered and she has been feeling more irritable and perhaps more depressed after stopping bupropion because she was told that it would interact with her antiestrogen pill.  She follows with psychiatry.  She is here with her daughter today  Vitals:   01/14/21 1115  BP: (!) 142/58  Pulse: 68  Resp: 18  Temp: (!) 97.3 F (36.3 C)  SpO2: 97%   Wt Readings from Last 3 Encounters:  01/14/21 159 lb (72.1 kg)  12/03/20 159 lb 8 oz  (72.3 kg)  10/11/20 159 lb 12.8 oz (72.5 kg)                                 ALLERGIES:  is allergic to adhesive [tape] and latex.  Meds: Current Outpatient Medications  Medication Sig Dispense Refill  . acetaminophen (TYLENOL 8 HOUR) 650 MG CR tablet Take 650 mg by mouth 2 (two) times daily as needed for pain.     Marland Kitchen albuterol (PROVENTIL HFA;VENTOLIN HFA) 108 (90 Base) MCG/ACT inhaler Inhale 2 puffs into the lungs every 6 (six) hours as needed for wheezing or shortness of breath. (Patient not taking: Reported on 10/29/2020)    . ALPRAZolam (XANAX) 0.25 MG tablet Take 0.25 mg by mouth daily as needed. (Patient not taking: Reported on 10/29/2020)    . aspirin EC 81 MG tablet Take 81 mg by mouth daily. (Patient not taking: Reported on 10/29/2020)    . atorvastatin (LIPITOR) 10 MG tablet Take 10 mg by mouth at bedtime.    Marland Kitchen buPROPion (WELLBUTRIN XL) 150 MG 24 hr tablet Take 150 mg by mouth daily.     . Cholecalciferol (VITAMIN D) 2000 units tablet Take 4,000 Units by mouth daily.     Marland Kitchen escitalopram (LEXAPRO) 20 MG tablet Take 40 mg by mouth daily.  2  . fluticasone (FLONASE) 50 MCG/ACT nasal spray Place 2 sprays into both nostrils daily.    Marland Kitchen HYDROcodone-acetaminophen (NORCO/VICODIN) 5-325 MG tablet Take 1-2  tablets by mouth every 6 (six) hours as needed for moderate pain or severe pain. (Patient not taking: Reported on 10/29/2020) 10 tablet 0  . Multiple Vitamin (MULTIVITAMIN WITH MINERALS) TABS tablet Take 1 tablet by mouth daily.    Marland Kitchen omeprazole (PRILOSEC) 40 MG capsule Take 40 mg by mouth daily.    [name] Glycol-Propyl Glycol (SYSTANE) 0.4-0.3 % SOLN Place 1 drop into both eyes daily as needed (dry eyes).    . tamoxifen (NOLVADEX) 20 MG tablet Take 1 tablet (20 mg total) by mouth daily. 30 tablet 2  . tiotropium (SPIRIVA) 18 MCG inhalation capsule Place 18 mcg into inhaler and inhale daily.     No current facility-administered medications for this encounter.    Physical  Findings: The patient is in no acute distress. Patient is alert and oriented.  height is 5\' 1"  (1.549 m) and weight is 159 lb (72.1 kg). Her temporal temperature is 97.3 F (36.3 C) (abnormal). Her blood pressure is 142/58 (abnormal) and her pulse is 68. Her respiration is 18 and oxygen saturation is 97%. .    Satisfactory skin healing in radiotherapy fields.  She has some minimal erythema remaining at the inframammary fold of the right breast.  She has some seborrheic keratoses at the inframammary fold as well.  Skin is intact and dry  Lab Findings: Lab Results  Component Value Date   WBC 7.9 09/08/2020   HGB 13.6 09/08/2020   HCT 41.6 09/08/2020   MCV 94.1 09/08/2020   PLT 164 09/08/2020    Radiographic Findings: No results found.  Impression/Plan: Healing well from radiotherapy to the breast tissue.  Her skin is well moisturized and I told her that she can either stop using skin care products if that is easiest, but if she wishes, she may also continue skin care with topical Vitamin E Oil and / or lotion for at least 2 more months for further healing.  I recommended that she either not wear a bra, or if she prefers to wear a bra, to wear a gentle, seamless bra that does not have a band of elastic at the inframammary fold  She seems discouraged today and I can tell that her mood is more depressed than previously.  I suspect some of this may be due to the stress that she is experienced over the past several months related to her diagnosis but I also suspect it may be related to the fact that she recently stopped bupropion.  I encouraged her to talk with her psychiatry team about this.  If altering her antidepressant regimen does not help her mood I also recommended that she talk to Dr. Burr Medico in case her antiestrogen treatment is affecting her mood.  I encouraged her to continue followup with medical oncology. I will see her back on an as-needed basis. I have encouraged her to call if she has  any issues or concerns in the future. I wished her the very best.  On date of service, in total, I spent 20 minutes on this encounter. Patient was seen in person.  _____________________________________   Eppie Gibson, MD

## 2021-01-20 ENCOUNTER — Other Ambulatory Visit: Payer: Self-pay

## 2021-01-20 ENCOUNTER — Ambulatory Visit (HOSPITAL_COMMUNITY)
Admission: RE | Admit: 2021-01-20 | Discharge: 2021-01-20 | Disposition: A | Discharge status: Home / Self Care | Payer: Medicare PPO | Source: Ambulatory Visit | Attending: Vascular Surgery | Admitting: Vascular Surgery

## 2021-01-20 ENCOUNTER — Ambulatory Visit: Payer: Medicare PPO | Admitting: Physician Assistant

## 2021-01-20 VITALS — Resp 14 | Ht 61.0 in | Wt 160.0 lb

## 2021-01-20 DIAGNOSIS — I779 Disorder of arteries and arterioles, unspecified: Secondary | ICD-10-CM

## 2021-01-20 NOTE — Progress Notes (Signed)
[facility] [place] HISTORY AND PHYSICAL   History of Present Illness:  Patient is a 87 y.o. year old female who presents for evaluation of claudication.  She denise rest pain and non healing wounds.  She is able to carry out activities of daily living.   She is followed by Dr. Oneida Alar for occasional pain in her left calf and foot changing colors in a dependent position.  She continues to take ASA and daily Statin.  Past Medical History:  Diagnosis Date  . Anxiety   . Back pain   . Cancer ([place])   . Depression   . Emphysema   . Esophageal spasm   . Lung nodule   . OSA (obstructive sleep apnea)   . Osteomalacia   . Vertigo   . Vitamin D deficiency     Past Surgical History:  Procedure Laterality Date  . ABDOMINAL HYSTERECTOMY    . APPENDECTOMY    . BREAST EXCISIONAL BIOPSY Left 1978  . BREAST LUMPECTOMY WITH RADIOACTIVE SEED LOCALIZATION Right 09/30/2020   Procedure: RIGHT BREAST LUMPECTOMY WITH RADIOACTIVE SEED LOCALIZATION;  Surgeon: Jovita Kussmaul, MD;  Location: [facility];  Service: General;  Laterality: Right;  . CARPAL TUNNEL RELEASE    . ROTATOR CUFF REPAIR      ROS:   General:  No weight loss, Fever, chills  HEENT: No recent headaches, no nasal bleeding, no visual changes, no sore throat  Neurologic: No dizziness, blackouts, seizures. No recent symptoms of stroke or mini- stroke. No recent episodes of slurred speech, or temporary blindness.  Cardiac: No recent episodes of chest pain/pressure, no shortness of breath at rest.  No shortness of breath with exertion.  Denies history of atrial fibrillation or irregular heartbeat  Vascular: No history of rest pain in feet.  No history of claudication.  No history of non-healing ulcer, No history of DVT   Pulmonary: No home oxygen, no productive cough, no hemoptysis,  No asthma or wheezing  Musculoskeletal:  [ ]  Arthritis, [ ]  Low back pain,  [x ] Joint pain  Hematologic:No history of  hypercoagulable state.  No history of easy bleeding.  No history of anemia  Gastrointestinal: No hematochezia or melena,  No gastroesophageal reflux, no trouble swallowing  Urinary: [ ]  chronic Kidney disease, [ ]  on HD - [ ]  MWF or [ ]  TTHS, [ ]  Burning with urination, [ ]  Frequent urination, [ ]  Difficulty urinating;   Skin: No rashes  Psychological: + history of anxiety,  No history of depression  Social History Social History   Tobacco Use  . Smoking status: Former Smoker    Packs/day: 1.00    Years: 56.00    Pack years: 56.00    Types: Cigarettes    Quit date: 03/2016    Years since quitting: 4.8  . Smokeless tobacco: Never Used  Vaping Use  . Vaping Use: Never used  Substance Use Topics  . Alcohol use: No    Alcohol/week: 0.0 standard drinks  . Drug use: No    Family History Family History  Problem Relation Age of Onset  . Diabetes type II Other   . Hypertension Other   . Breast cancer Maternal Aunt        dx early 64s  . Other Mother        died of natural causes at age 38  . Other Father        stroke or heart attack while in shower  .  Diabetes type II Sister   . Cancer Paternal Uncle        unknown type; dx early 70s    Allergies  Allergies  Allergen Reactions  . Adhesive [Tape] Itching and Other (See Comments)    Little red itchy bumps develop  . Latex Itching and Other (See Comments)    Little red itchy bumps develop     Current Outpatient Medications  Medication Sig Dispense Refill  . acetaminophen (TYLENOL) 650 MG CR tablet Take 650 mg by mouth 2 (two) times daily as needed for pain.     Marland Kitchen albuterol (PROVENTIL HFA;VENTOLIN HFA) 108 (90 Base) MCG/ACT inhaler Inhale 2 puffs into the lungs every 6 (six) hours as needed for wheezing or shortness of breath.    Marland Kitchen aspirin EC 81 MG tablet Take 81 mg by mouth daily.    Marland Kitchen atorvastatin (LIPITOR) 10 MG tablet Take 10 mg by mouth at bedtime.    . Cholecalciferol (VITAMIN D) 2000 units tablet Take 4,000  Units by mouth daily.     Marland Kitchen escitalopram (LEXAPRO) 20 MG tablet Take 40 mg by mouth daily.  2  . fluticasone (FLONASE) 50 MCG/ACT nasal spray Place 2 sprays into both nostrils daily.    . Multiple Vitamin (MULTIVITAMIN WITH MINERALS) TABS tablet Take 1 tablet by mouth daily.    Marland Kitchen omeprazole (PRILOSEC) 40 MG capsule Take 40 mg by mouth daily.    Vladimir Faster Glycol-Propyl Glycol (SYSTANE) 0.4-0.3 % SOLN Place 1 drop into both eyes daily as needed (dry eyes).    . tamoxifen (NOLVADEX) 20 MG tablet Take 1 tablet (20 mg total) by mouth daily. 30 tablet 2  . tiotropium (SPIRIVA) 18 MCG inhalation capsule Place 18 mcg into inhaler and inhale daily.    Marland Kitchen ALPRAZolam (XANAX) 0.25 MG tablet Take 0.25 mg by mouth daily as needed. (Patient not taking: Reported on 01/20/2021)    . buPROPion (WELLBUTRIN XL) 150 MG 24 hr tablet Take 150 mg by mouth daily.  (Patient not taking: Reported on 01/20/2021)    . HYDROcodone-acetaminophen (NORCO/VICODIN) 5-325 MG tablet Take 1-2 tablets by mouth every 6 (six) hours as needed for moderate pain or severe pain. (Patient not taking: Reported on 01/20/2021) 10 tablet 0   No current facility-administered medications for this visit.    Physical Examination  Vitals:   01/20/21 1516  Resp: 14  Weight: 160 lb (72.6 kg)  Height: 5\' 1"  (1.549 m)    Body mass index is 30.23 kg/m.  General:  Alert and oriented, no acute distress HEENT: Normal Neck: No bruit or JVD Pulmonary: Clear to auscultation bilaterally Cardiac: Regular Rate and Rhythm without murmur Abdomen: Soft, non-tender, non-distended, no mass, no scars Skin: No rash Extremity Pulses:  2+ radial, brachial, femoral, dorsalis pedis,  pulses bilaterally Musculoskeletal: No deformity or edema  Neurologic: Upper and lower extremity motor 5/5 and symmetric  DATA:     ABI Findings:  +---------+------------------+-----+----------+--------+  Right  Rt Pressure (mmHg)IndexWaveform Comment    +---------+------------------+-----+----------+--------+  Brachial 133                      +---------+------------------+-----+----------+--------+  ATA   143        1.06            +---------+------------------+-----+----------+--------+  PTA   124        0.92 monophasic      +---------+------------------+-----+----------+--------+  PERO   141        1.04 triphasic       +---------+------------------+-----+----------+--------+  DP                triphasic       +---------+------------------+-----+----------+--------+  Great Toe91        0.67            +---------+------------------+-----+----------+--------+   +---------+------------------+-----+---------+-------+  Left   Lt Pressure (mmHg)IndexWaveform Comment  +---------+------------------+-----+---------+-------+  Brachial 135                      +---------+------------------+-----+---------+-------+  ATA   115        0.85            +---------+------------------+-----+---------+-------+  PTA   130        0.96 triphasic      +---------+------------------+-----+---------+-------+  PERO   107        0.79 triphasic      +---------+------------------+-----+---------+-------+  DP                triphasic      +---------+------------------+-----+---------+-------+  Great Toe111        0.82            +---------+------------------+-----+---------+-------+   +-------+-----------+-----------+------------+------------+  ABI/TBIToday's ABIToday's TBIPrevious ABIPrevious TBI  +-------+-----------+-----------+------------+------------+  Right 1.06    0.67    0.99    0.70      +-------+-----------+-----------+------------+------------+  Left   0.96    0.82    0.98    0.77      +-------+-----------+-----------+------------+------------+    Bilateral ABIs appear essentially unchanged compared to prior study on  12/25/2019.    Summary:  Right: Resting right ankle-brachial index is within normal range. No  evidence of significant right lower extremity arterial disease. The right  toe-brachial index is abnormal. RT great toe pressure = 91 mmHg.   Left: Resting left ankle-brachial index is within normal range. No  evidence of significant left lower extremity arterial disease. The left  toe-brachial index is normal. LT Great toe pressure = 111 mmHg.   ASSESSMENT:  PAD with remote symptoms associated to claudication. Her ABI studies today are stable and unchanged.   She has palpable Pedal pulses and no sign of non healing wounds   PLAN: Follow up in 1 year with repeat ABI She continues to take Asprin and statin daily.     Roxy Horseman PA-C [facility] [place] Office: [phone]  MD on call Stanford Breed

## 2021-02-09 ENCOUNTER — Telehealth: Payer: Self-pay | Admitting: Nurse Practitioner

## 2021-02-09 NOTE — Telephone Encounter (Signed)
Moved upcoming appointment per provider's template and changed to virtual per patient's request. Patient and patient's daughter are aware of changes.

## 2021-02-11 DIAGNOSIS — R0602 Shortness of breath: Secondary | ICD-10-CM | POA: Diagnosis not present

## 2021-02-11 DIAGNOSIS — J449 Chronic obstructive pulmonary disease, unspecified: Secondary | ICD-10-CM | POA: Diagnosis not present

## 2021-02-25 ENCOUNTER — Other Ambulatory Visit: Payer: Self-pay | Admitting: Hematology

## 2021-03-01 ENCOUNTER — Encounter: Payer: Self-pay | Admitting: Nurse Practitioner

## 2021-03-03 ENCOUNTER — Encounter: Payer: Self-pay | Admitting: Nurse Practitioner

## 2021-03-03 ENCOUNTER — Inpatient Hospital Stay: Payer: Medicare PPO | Attending: Hematology | Admitting: Nurse Practitioner

## 2021-03-03 DIAGNOSIS — Z170 Estrogen receptor positive status [ER+]: Secondary | ICD-10-CM

## 2021-03-03 DIAGNOSIS — C50211 Malignant neoplasm of upper-inner quadrant of right female breast: Secondary | ICD-10-CM | POA: Diagnosis not present

## 2021-03-03 NOTE — Progress Notes (Signed)
CLINIC: Survivorship   [name], MD as PCP - General (Family Medicine) Pershing Proud, RN as Oncology Nurse Navigator Rogelia Boga, Eileen Stanford, [name], [name], [name], [name], NP as Nurse Practitioner (Nurse Practitioner)   I connected with [name]. [name] on 03/03/21 at 11:00 AM EDT by telephone and verified that I am speaking with the correct person using two identifiers.  I discussed the limitations, risks, security and privacy concerns of performing an evaluation and management service by telephone and the availability of in person appointments. I also discussed with the patient that there may be a patient responsible charge related to this service. The patient expressed understanding and agreed to proceed.   BRIEF ONCOLOGIC HISTORY:  Oncology History Overview Note  Cancer Staging Malignant neoplasm of upper-inner quadrant of right breast in female, estrogen receptor positive (HCC) Staging form: Breast, AJCC 8th Edition - Clinical stage from 08/31/2020: Stage IA (cT1b, cN0, cM0, G2, ER+, PR+, HER2-) - Signed by Malachy Mood, MD on 09/07/2020    Malignant neoplasm of upper-inner quadrant of right breast in female, estrogen receptor positive (HCC)  08/02/2020 Mammogram   IMPRESSION: Interval increase in size of an asymmetry in the slightly medial right breast. Patient was unable to tolerate an mL view today due to a rash underneath her breast. There is a possible subtle sonographic correlate at 4:30 o'clock 6 cm from the nipple. This measures 0.9 x 0.7 x 0.6 cm.   08/31/2020 Cancer Staging   Staging form: Breast, AJCC 8th Edition - Clinical stage from 08/31/2020: Stage IA (cT1b, cN0, cM0, G2, ER+, PR+, HER2-) - Signed by Malachy Mood, MD on 09/07/2020   08/31/2020 Initial Biopsy    Diagnosis Breast, right, needle core biopsy, LIQ, coil clip - INVASIVE DUCTAL CARCINOMA - SEE COMMENT Microscopic Comment Based on the biopsy, the carcinoma appears Nottingham grade 2 of 3, has foci of extracellular mucin and measures 1 cm in greatest linear extent. Prognostic markers (ER/PR/ki-67/HER2) are pending and will be reported in an addendum. Dr. Rayetta Pigg reviewed the case and agrees with the above diagnosis. These results were called to [facility] on September 01, 2020.   08/31/2020 Receptors her2   ADDITIONAL INFORMATION: Her2 is NEGATIVE (0).   Results: IMMUNOHISTOCHEMICAL AND MORPHOMETRIC ANALYSIS PERFORMED MANUALLY Estrogen Receptor: 95%, POSITIVE, STRONG STAINING INTENSITY Progesterone Receptor: 95%, POSITIVE, STRONG STAINING INTENSITY Proliferation Marker Ki67: 10%   09/03/2020 Initial Diagnosis   Malignant neoplasm of upper-inner quadrant of right breast in female, estrogen receptor positive (HCC)   09/30/2020 Surgery   RIGHT BREAST LUMPECTOMY WITH RADIOACTIVE SEED LOCALIZATION by Dr Carolynne Edouard   09/30/2020 Pathology Results   FINAL MICROSCOPIC DIAGNOSIS:   A. BREAST, RIGHT, LUMPECTOMY:  - Invasive ductal carcinoma with extracellular mucin, 1.3 cm, grade 2  - Ductal carcinoma in situ low-grade  - Resection margins are negative for invasive carcinoma; closest  anterior margin at less than 1 mm and lateral margin at 2 mm  - DCIS focally involves the anterior margin and is 1 mm from the lateral  margin  - Negative for lymphovascular perineural invasion  - Biopsy site changes  - See oncology table    09/30/2020 Cancer Staging   Staging form: Breast, AJCC 8th Edition - Pathologic stage from 09/30/2020: Stage Unknown (pT1c, pNX, cM0, G2, ER+, PR+, HER2-) - Signed by  Truitt Merle, MD on 01/13/2021   11/07/2020 - 12/06/2020 Radiation Therapy   Adjuvant Radiation with Dr Isidore Moos    12/2020 -  Anti-estrogen oral therapy   Tamoxifen $RemoveBe'20mg'InBnShcjX$  once daily  starting in 12/2020.    03/03/2021 Survivorship   SCP delivered virtually by Cira Rue, NP      INTERVAL HISTORY:  [name] to review her survivorship care plan detailing her treatment course for breast cancer, as well as monitoring long-term side effects of that treatment, education regarding health maintenance, screening, and overall wellness and health promotion.     Overall, [name] reports feeling quite well since completing her radiation therapy approximately 3 months ago.  She is struggling with emphysema but doing well from a breast cancer standpoint.  She has intermittent right axillary tenderness, no lump/mass.  She can do housework and drive.  She has baseline joint aches and pains, no worse on tamoxifen.  Denies hot flashes.  Denies bleeding.   ONCOLOGY TREATMENT TEAM:  1. Surgeon:  Dr. Marlou Starks at [facility] 2. Medical Oncologist: Dr. Burr Medico  3. Radiation Oncologist: Dr. Isidore Moos    PAST MEDICAL/SURGICAL HISTORY:  Past Medical History:  Diagnosis Date  . Anxiety   . Back pain   . Cancer ([place])   . Depression   . Emphysema   . Esophageal spasm   . Lung nodule   . OSA (obstructive sleep apnea)   . Osteomalacia   . Vertigo   . Vitamin D deficiency    Past Surgical History:  Procedure Laterality Date  . ABDOMINAL HYSTERECTOMY    . APPENDECTOMY    . BREAST EXCISIONAL BIOPSY Left 1978  . BREAST LUMPECTOMY WITH RADIOACTIVE SEED LOCALIZATION Right 09/30/2020   Procedure: RIGHT BREAST LUMPECTOMY WITH RADIOACTIVE SEED LOCALIZATION;  Surgeon: Jovita Kussmaul, MD;  Location: [facility];  Service: General;  Laterality: Right;  . CARPAL TUNNEL RELEASE    . ROTATOR CUFF REPAIR       ALLERGIES:  Allergies  Allergen Reactions  . Adhesive [Tape] Itching and Other (See Comments)    Little red itchy bumps develop  . Latex Itching and Other (See Comments)    Little red itchy bumps develop     CURRENT MEDICATIONS:  Outpatient Encounter  Medications as of 03/03/2021  Medication Sig  . acetaminophen (TYLENOL) 650 MG CR tablet Take 650 mg by mouth 2 (two) times daily as needed for pain.   Marland Kitchen albuterol (PROVENTIL HFA;VENTOLIN HFA) 108 (90 Base) MCG/ACT inhaler Inhale 2 puffs into the lungs every 6 (six) hours as needed for wheezing or shortness of breath.  . ALPRAZolam (XANAX) 0.25 MG tablet Take 0.25 mg by mouth daily as needed. (Patient not taking: Reported on 01/20/2021)  . aspirin EC 81 MG tablet Take 81 mg by mouth daily.  Marland Kitchen atorvastatin (LIPITOR) 10 MG tablet Take 10 mg by mouth at bedtime.  Marland Kitchen buPROPion (WELLBUTRIN XL) 150 MG 24 hr tablet Take 150 mg by mouth daily.  (Patient not taking: Reported on 01/20/2021)  . Cholecalciferol (VITAMIN D) 2000 units tablet Take 4,000 Units by mouth daily.   Marland Kitchen escitalopram (LEXAPRO) 20 MG tablet Take 40 mg by mouth daily.  . fluticasone (FLONASE) 50 MCG/ACT nasal spray Place 2 sprays into both nostrils daily.  Marland Kitchen HYDROcodone-acetaminophen (NORCO/VICODIN) 5-325 MG tablet Take 1-2 tablets by mouth every 6 (six) hours as needed for moderate pain or severe pain. (Patient not taking: Reported on 01/20/2021)  . Multiple Vitamin (MULTIVITAMIN WITH MINERALS) TABS tablet  Take 1 tablet by mouth daily.  Marland Kitchen omeprazole (PRILOSEC) 40 MG capsule Take 40 mg by mouth daily.  Vladimir Faster Glycol-Propyl Glycol (SYSTANE) 0.4-0.3 % SOLN Place 1 drop into both eyes daily as needed (dry eyes).  . tamoxifen (NOLVADEX) 20 MG tablet TAKE 1 TABLET BY MOUTH EVERY DAY  . tiotropium (SPIRIVA) 18 MCG inhalation capsule Place 18 mcg into inhaler and inhale daily.   No facility-administered encounter medications on file as of 03/03/2021.     ONCOLOGIC FAMILY HISTORY:  Family History  Problem Relation Age of Onset  . Diabetes type II Other   . Hypertension Other   . Breast cancer Maternal Aunt        dx early 8s  . Other Mother        died of natural causes at age 38  . Other Father        stroke or heart attack while in  shower  . Diabetes type II Sister   . Cancer Paternal Uncle        unknown type; dx early 49s     GENETIC COUNSELING/TESTING: N/A  SOCIAL HISTORY:  Social History   Socioeconomic History  . Marital status: Widowed    Spouse name: Not on file  . Number of children: 4  . Years of education: Bachelors  . Highest education level: Not on file  Occupational History  . Occupation: Retired  Tobacco Use  . Smoking status: Former Smoker    Packs/day: 1.00    Years: 56.00    Pack years: 56.00    Types: Cigarettes    Quit date: 03/2016    Years since quitting: 4.9  . Smokeless tobacco: Never Used  Vaping Use  . Vaping Use: Never used  Substance and Sexual Activity  . Alcohol use: No    Alcohol/week: 0.0 standard drinks  . Drug use: No  . Sexual activity: Not Currently  Other Topics Concern  . Not on file  Social History Narrative   Lives at home alone.   Right-handed.   1.5 cups caffeine per day, occasional Diet Coke.   Social Determinants of Health   Financial Resource Strain: Not on file  Food Insecurity: Not on file  Transportation Needs: Not on file  Physical Activity: Not on file  Stress: Not on file  Social Connections: Not on file  Intimate Partner Violence: Not on file     OBSERVATIONS/OBJECTIVE:  Patient appears well over the phone.  Voice is strong, speech is clear.  No cough or conversational dyspnea  LABORATORY DATA:  None for this visit.  DIAGNOSTIC IMAGING:  None for this visit.      ASSESSMENT AND PLAN:  [name] is a pleasant 87 y.o. female with Stage 1A right breast invasive ductal carcinoma, ER+/PR+/HER2-, diagnosed in 08/2020, treated with lumpectomy, adjuvant radiation therapy, and anti-estrogen therapy with tamoxifen beginning in 12/2020.  She presents to the Survivorship Clinic for our initial meeting and routine follow-up post-completion of treatment for breast cancer.    1. Stage 1A right breast cancer:  [name] has recovered  well from definitive treatment for breast cancer. She will follow-up with her medical oncologist, Dr. Burr Medico in 05/2021 with history and physical exam per surveillance protocol.  She will continue her anti-estrogen therapy with tamoxifen. Thus far, she is tolerating well, with stable joint aches, otherwise no significant side effects. She was instructed to make Dr. Burr Medico or myself aware if she begins to experience any worsening side effects of the  medication and I could see her back in clinic to help manage those side effects, as needed. Her mammogram is due 08/2021; orders placed today.  Her breast density is category b. Today, a comprehensive survivorship care plan and treatment summary was reviewed with the patient today detailing her breast cancer diagnosis, treatment course, potential late/long-term effects of treatment, appropriate follow-up care with recommendations for the future, and patient education resources.  A copy of this summary, along with a letter will be sent to the patient's primary care provider via In Basket message after today's visit.   2. Emphysema: continue f/up per Dr. Cari Caraway. Patient anticipates needing supplemental oxygen soon  3. Bone health:  Given [name]'s age/history of breast cancer, she is at risk for bone demineralization.  Her last DEXA scan is unknown. She is agreeable to get new baseline DEXA with next mammogram.  In the meantime, she was encouraged to increase her consumption of foods rich in calcium, as well as increase her weight-bearing activities.  She was given education on specific activities to promote bone health.  4. Cancer screening:  Due to [name]'s history of emphysema and her age, she should engage in shared decision making with her providers about the appropriateness of screening. The information and recommendations are listed on the patient's comprehensive care plan/treatment summary and were reviewed in detail with the patient.   5. Health  maintenance and wellness promotion: [name] was encouraged to consume 5-7 servings of fruits and vegetables per day. We reviewed the "Nutrition Rainbow" handout, as well as the handout "Take Control of Your Health and Reduce Your Cancer Risk" from the [employer].  She was also encouraged to engage in moderate to vigorous exercise for 30 minutes per day most days of the week. We discussed the [employer] fitness program, which is designed for cancer survivors to help them become more physically fit after cancer treatments.  She was instructed to limit her alcohol consumption and continue to abstain from tobacco use.   6. Support services/counseling: It is not uncommon for this period of the patient's cancer care trajectory to be one of many emotions and stressors.  We discussed how this can be increasingly difficult during the times of quarantine and social distancing due to the COVID-19 pandemic.   She was given information regarding our available services and encouraged to contact me with any questions or for help enrolling in any of our support group/programs.    Follow up instructions:    -Return to cancer center 06/03/2021  -Mammogram and DEXA 08/2021 -Follow up with surgery as indicated  -She is welcome to return back to the Survivorship Clinic at any time; no additional follow-up needed at this time.  -Consider referral back to survivorship as a long-term survivor for continued surveillance The patient was provided an opportunity to ask questions and all were answered. The patient agreed with the plan and demonstrated an understanding of the instructions.  The patient was advised to call back or seek an in-person evaluation if the symptoms worsen or if the condition fails to improve as anticipated.  I provided 20 minutes of non-face-to-face time during this encounter.   Alla Feeling, NP

## 2021-03-04 ENCOUNTER — Telehealth: Payer: Self-pay | Admitting: Hematology

## 2021-03-04 NOTE — Telephone Encounter (Signed)
Changed upcoming appointment to in-person per 3/17 los. [name] is aware of changes.

## 2021-03-10 DIAGNOSIS — E559 Vitamin D deficiency, unspecified: Secondary | ICD-10-CM | POA: Diagnosis not present

## 2021-03-10 DIAGNOSIS — E782 Mixed hyperlipidemia: Secondary | ICD-10-CM | POA: Diagnosis not present

## 2021-03-10 DIAGNOSIS — Z1389 Encounter for screening for other disorder: Secondary | ICD-10-CM | POA: Diagnosis not present

## 2021-03-10 DIAGNOSIS — Z0000 Encounter for general adult medical examination without abnormal findings: Secondary | ICD-10-CM | POA: Diagnosis not present

## 2021-03-15 DIAGNOSIS — F410 Panic disorder [episodic paroxysmal anxiety] without agoraphobia: Secondary | ICD-10-CM | POA: Diagnosis not present

## 2021-03-15 DIAGNOSIS — F331 Major depressive disorder, recurrent, moderate: Secondary | ICD-10-CM | POA: Diagnosis not present

## 2021-03-23 DIAGNOSIS — J310 Chronic rhinitis: Secondary | ICD-10-CM | POA: Diagnosis not present

## 2021-03-23 DIAGNOSIS — C50911 Malignant neoplasm of unspecified site of right female breast: Secondary | ICD-10-CM | POA: Diagnosis not present

## 2021-03-23 DIAGNOSIS — F3341 Major depressive disorder, recurrent, in partial remission: Secondary | ICD-10-CM | POA: Diagnosis not present

## 2021-03-23 DIAGNOSIS — E559 Vitamin D deficiency, unspecified: Secondary | ICD-10-CM | POA: Diagnosis not present

## 2021-03-23 DIAGNOSIS — E782 Mixed hyperlipidemia: Secondary | ICD-10-CM | POA: Diagnosis not present

## 2021-03-23 DIAGNOSIS — J449 Chronic obstructive pulmonary disease, unspecified: Secondary | ICD-10-CM | POA: Diagnosis not present

## 2021-03-23 DIAGNOSIS — G4733 Obstructive sleep apnea (adult) (pediatric): Secondary | ICD-10-CM | POA: Diagnosis not present

## 2021-03-23 DIAGNOSIS — Z86010 Personal history of colonic polyps: Secondary | ICD-10-CM | POA: Diagnosis not present

## 2021-03-23 DIAGNOSIS — R296 Repeated falls: Secondary | ICD-10-CM | POA: Diagnosis not present

## 2021-04-06 DIAGNOSIS — H353210 Exudative age-related macular degeneration, right eye, stage unspecified: Secondary | ICD-10-CM | POA: Diagnosis not present

## 2021-04-13 DIAGNOSIS — G4733 Obstructive sleep apnea (adult) (pediatric): Secondary | ICD-10-CM | POA: Diagnosis not present

## 2021-04-21 DIAGNOSIS — C50311 Malignant neoplasm of lower-inner quadrant of right female breast: Secondary | ICD-10-CM | POA: Diagnosis not present

## 2021-04-21 DIAGNOSIS — Z170 Estrogen receptor positive status [ER+]: Secondary | ICD-10-CM | POA: Diagnosis not present

## 2021-05-04 DIAGNOSIS — G4733 Obstructive sleep apnea (adult) (pediatric): Secondary | ICD-10-CM | POA: Diagnosis not present

## 2021-05-17 DIAGNOSIS — H43813 Vitreous degeneration, bilateral: Secondary | ICD-10-CM | POA: Diagnosis not present

## 2021-05-17 DIAGNOSIS — H353112 Nonexudative age-related macular degeneration, right eye, intermediate dry stage: Secondary | ICD-10-CM | POA: Diagnosis not present

## 2021-05-17 DIAGNOSIS — H33322 Round hole, left eye: Secondary | ICD-10-CM | POA: Diagnosis not present

## 2021-05-17 DIAGNOSIS — H353211 Exudative age-related macular degeneration, right eye, with active choroidal neovascularization: Secondary | ICD-10-CM | POA: Diagnosis not present

## 2021-05-26 DIAGNOSIS — R3989 Other symptoms and signs involving the genitourinary system: Secondary | ICD-10-CM | POA: Diagnosis not present

## 2021-05-30 NOTE — Progress Notes (Signed)
[place]   Telephone:([phone] Fax:(336) [phone]   Clinic Follow up Note   Patient Care Team: Cari Caraway, MD as PCP - General (Family Medicine) Mauro Kaufmann, RN as Oncology Nurse Navigator Rockwell Germany, RN as Oncology Nurse Navigator Jovita Kussmaul, MD as Consulting Physician (General Surgery) Truitt Merle, MD as Consulting Physician (Hematology) Eppie Gibson, MD as Attending Physician (Radiation Oncology) Alla Feeling, NP as Nurse Practitioner (Nurse Practitioner)  Date of Service:  06/03/2021  CHIEF COMPLAINT: F/u of right breast cancer  SUMMARY OF ONCOLOGIC HISTORY: Oncology History Overview Note  Cancer Staging Malignant neoplasm of upper-inner quadrant of right breast in female, estrogen receptor positive ([place]) Staging form: Breast, AJCC 8th Edition - Clinical stage from 08/31/2020: Stage IA (cT1b, cN0, cM0, G2, ER+, PR+, HER2-) - Signed by Truitt Merle, MD on 09/07/2020    Malignant neoplasm of upper-inner quadrant of right breast in female, estrogen receptor positive ([place])  08/02/2020 Mammogram   IMPRESSION: Interval increase in size of an asymmetry in the slightly medial right breast. Patient was unable to tolerate an mL view today due to a rash underneath her breast. There is a possible subtle sonographic correlate at 4:30 o'clock 6 cm from the nipple. This measures 0.9 x 0.7 x 0.6 cm.   08/31/2020 Cancer Staging   Staging form: Breast, AJCC 8th Edition - Clinical stage from 08/31/2020: Stage IA (cT1b, cN0, cM0, G2, ER+, PR+, HER2-) - Signed by Truitt Merle, MD on 09/07/2020    08/31/2020 Initial Biopsy   Diagnosis Breast, right, needle core biopsy, LIQ, coil clip - INVASIVE DUCTAL CARCINOMA - SEE COMMENT Microscopic Comment Based on the biopsy, the carcinoma appears Nottingham grade 2 of 3, has foci of extracellular mucin and measures 1 cm in greatest linear extent. Prognostic markers (ER/PR/ki-67/HER2) are pending and will be reported in  an addendum. Dr. Jeannie Done reviewed the case and agrees with the above diagnosis. These results were called to [facility] on September 01, 2020.   08/31/2020 Receptors her2   ADDITIONAL INFORMATION: Her2 is NEGATIVE (0).   Results: IMMUNOHISTOCHEMICAL AND MORPHOMETRIC ANALYSIS PERFORMED MANUALLY Estrogen Receptor: 95%, POSITIVE, STRONG STAINING INTENSITY Progesterone Receptor: 95%, POSITIVE, STRONG STAINING INTENSITY Proliferation Marker Ki67: 10%   09/03/2020 Initial Diagnosis   Malignant neoplasm of upper-inner quadrant of right breast in female, estrogen receptor positive ([place])    09/30/2020 Surgery   RIGHT BREAST LUMPECTOMY WITH RADIOACTIVE SEED LOCALIZATION by Dr Marlou Starks   09/30/2020 Pathology Results   FINAL MICROSCOPIC DIAGNOSIS:   A. BREAST, RIGHT, LUMPECTOMY:  - Invasive ductal carcinoma with extracellular mucin, 1.3 cm, grade 2  - Ductal carcinoma in situ low-grade  - Resection margins are negative for invasive carcinoma; closest  anterior margin at less than 1 mm and lateral margin at 2 mm  - DCIS focally involves the anterior margin and is 1 mm from the lateral  margin  - Negative for lymphovascular perineural invasion  - Biopsy site changes  - See oncology table    09/30/2020 Cancer Staging   Staging form: Breast, AJCC 8th Edition - Pathologic stage from 09/30/2020: Stage Unknown (pT1c, pNX, cM0, G2, ER+, PR+, HER2-) - Signed by Truitt Merle, MD on 01/13/2021    11/07/2020 - 12/06/2020 Radiation Therapy   Adjuvant Radiation with Dr Isidore Moos    12/2020 -  Anti-estrogen oral therapy   Tamoxifen 29m once daily starting in 12/2020.    03/03/2021 Survivorship   SCP delivered virtually by LCira Rue NP  CURRENT THERAPY:  Tamoxifen 78m once daily starting in 12/2020.  INTERVAL HISTORY:  Kirsten MCKENNYis here for a follow up of right breast cancer. She was last seen by me 5 months ago. She presents to the clinic  with her daughter. She  notes she is doing well. She has been on Tamoxifen for the past 5 months and tolerating well. She denies hot flashes. She notes her BP is usually elevated in clinic, but on lower end of normal today. She notes having UTI for the past week and took last antibiotics yesterday. I reviewed her medication list with her. She is on Effexor and Lexapro. She notes this is helping her.     REVIEW OF SYSTEMS:   Constitutional: Denies fevers, chills or abnormal weight loss Eyes: Denies blurriness of vision Ears, nose, mouth, throat, and face: Denies mucositis or sore throat Respiratory: Denies cough, dyspnea or wheezes Cardiovascular: Denies palpitation, chest discomfort or lower extremity swelling Gastrointestinal:  Denies nausea, heartburn or change in bowel habits Skin: Denies abnormal skin rashes MSK: (+) Arthritis back pain  Lymphatics: Denies new lymphadenopathy or easy bruising Neurological:Denies numbness, tingling or new weaknesses Behavioral/Psych: Mood is stable, no new changes  All other systems were reviewed with the patient and are negative.  MEDICAL HISTORY:  Past Medical History:  Diagnosis Date   Anxiety    Back pain    Cancer (HCC)    Depression    Emphysema    Esophageal spasm    Lung nodule    OSA (obstructive sleep apnea)    Osteomalacia    Vertigo    Vitamin D deficiency     SURGICAL HISTORY: Past Surgical History:  Procedure Laterality Date   ABDOMINAL HYSTERECTOMY     APPENDECTOMY     BREAST EXCISIONAL BIOPSY Left 1978   BREAST LUMPECTOMY WITH RADIOACTIVE SEED LOCALIZATION Right 09/30/2020   Procedure: RIGHT BREAST LUMPECTOMY WITH RADIOACTIVE SEED LOCALIZATION;  Surgeon: TJovita Kussmaul MD;  [facility] [facility]  Service: General;  Laterality: Right;   CARPAL TUNNEL RELEASE     ROTATOR CUFF REPAIR      I have reviewed the social history and family history with the patient and they are unchanged from previous note.  ALLERGIES:  is allergic to  adhesive [tape] and latex.  MEDICATIONS:  Current Outpatient Medications  Medication Sig Dispense Refill   acetaminophen (TYLENOL) 650 MG CR tablet Take 650 mg by mouth 2 (two) times daily as needed for pain.      albuterol (PROVENTIL HFA;VENTOLIN HFA) 108 (90 Base) MCG/ACT inhaler Inhale 2 puffs into the lungs every 6 (six) hours as needed for wheezing or shortness of breath.     ALPRAZolam (XANAX) 0.25 MG tablet Take 0.25 mg by mouth daily as needed. (Patient not taking: Reported on 01/20/2021)     aspirin EC 81 MG tablet Take 81 mg by mouth daily.     atorvastatin (LIPITOR) 10 MG tablet Take 10 mg by mouth at bedtime.     Cholecalciferol (VITAMIN D) 2000 units tablet Take 4,000 Units by mouth daily.      escitalopram (LEXAPRO) 20 MG tablet Take 40 mg by mouth daily.  2   fluticasone (FLONASE) 50 MCG/ACT nasal spray Place 2 sprays into both nostrils daily.     HYDROcodone-acetaminophen (NORCO/VICODIN) 5-325 MG tablet Take 1-2 tablets by mouth every 6 (six) hours as needed for moderate pain or severe pain. (Patient not taking: Reported on 01/20/2021) 10 tablet 0  Multiple Vitamin (MULTIVITAMIN WITH MINERALS) TABS tablet Take 1 tablet by mouth daily.     omeprazole (PRILOSEC) 40 MG capsule Take 40 mg by mouth daily.     Polyethyl Glycol-Propyl Glycol (SYSTANE) 0.4-0.3 % SOLN Place 1 drop into both eyes daily as needed (dry eyes).     tamoxifen (NOLVADEX) 20 MG tablet TAKE 1 TABLET BY MOUTH EVERY DAY 90 tablet 3   tiotropium (SPIRIVA) 18 MCG inhalation capsule Place 18 mcg into inhaler and inhale daily.     No current facility-administered medications for this visit.    PHYSICAL EXAMINATION: ECOG PERFORMANCE STATUS: 1 - Symptomatic but completely ambulatory  Vitals:   06/03/21 1451  BP: (!) 114/52  Pulse: 88  Resp: 18  Temp: 97.6 F (36.4 C)  SpO2: 95%   Filed Weights   06/03/21 1451  Weight: 157 lb (71.2 kg)    GENERAL:alert, no distress and comfortable SKIN: skin color,  texture, turgor are normal, no rashes or significant lesions EYES: normal, Conjunctiva are pink and non-injected, sclera clear  NECK: supple, thyroid normal size, non-tender, without nodularity LYMPH:  no palpable lymphadenopathy in the cervical, axillary  LUNGS: clear to auscultation and percussion with normal breathing effort HEART: regular rate & rhythm and no murmurs and no lower extremity edema ABDOMEN:abdomen soft, non-tender and normal bowel sounds Musculoskeletal:no cyanosis of digits and no clubbing  NEURO: alert & oriented x 3 with fluent speech, no focal motor/sensory deficits BREAST: S/p right lumpectomy: surgical incision healed very well. No palpable mass, nodules or adenopathy bilaterally. Breast exam benign.  EXAM PERFORMED IN WHEELCHAIR TODAY   LABORATORY DATA:  I have reviewed the data as listed CBC Latest Ref Rng & Units 06/03/2021 09/08/2020 03/14/2020  WBC 4.0 - 10.5 K/uL 6.6 7.9 -  Hemoglobin 12.0 - 15.0 g/dL 12.8 13.6 14.3  Hematocrit 36.0 - 46.0 % 38.6 41.6 42.0  Platelets 150 - 400 K/uL 163 164 -     CMP Latest Ref Rng & Units 09/08/2020 03/14/2020 09/15/2017  Glucose 70 - 99 mg/dL 94 102(H) 117(H)  BUN 8 - 23 mg/dL _0 Creatinine 0.44 - 1.00 mg/dL 0.80 0.60 0.73  Sodium 135 - 145 mmol/L 138 140 138  Potassium 3.5 - 5.1 mmol/L 4.1 4.1 3.6  Chloride 98 - 111 mmol/L 101 106 104  CO2 22 - 32 mmol/L 32 - 26  Calcium 8.9 - 10.3 mg/dL 9.7 - 8.9  Total Protein 6.5 - 8.1 g/dL 6.9 - -  Total Bilirubin 0.3 - 1.2 mg/dL 0.5 - -  Alkaline Phos 38 - 126 U/L 74 - -  AST 15 - 41 U/L 15 - -  ALT 0 - 44 U/L 14 - -      RADIOGRAPHIC STUDIES: I have personally reviewed the radiological images as listed and agreed with the findings in the report. No results found.   ASSESSMENT & PLAN:  [name] is a 87 y.o. female with    1. Malignant neoplasm of upper-inner quadrant of right breast, Stage IA, P(T1cNxM0), ER+/PR+/HER2-, Grade II -She was diagnosed in  08/2020. She underwent right breast lumpectomy with Dr Marlou Starks on 09/30/20. Her surgical pathology showed her 1.3cm invasive cancer, G2, with components of low grade DCIS was completely removed except positive anterior margins with DCIS.   -Standard care for positive margins would include re-excision surgery to get negative margin. However due to her advanced age and low grade DCIS only on positive margin, we think it's reasonable to avoid second  surgery. -She completed Adjuvant Radiation with Dr Isidore Moos 11/07/20-12/06/20.  -To reduce her risk of distant breast cancer, I started her on antiestrogen therapy with Tamoxifen starting in 12/2020. She is tolerating 74m daily well so far -She is clinically doing well. Lab reviewed, her CBC and CMP are within normal limits. Her physical exam was unremarkable. There is no clinical concern for recurrence. -Continue Surveillance. Next mammogram on 08/25/21.  -F/u in 6 months.     2. History of falls, Bone Health  -She has had fall in 2020-2021.Her worst fall was in 02/2020 which results in rib fracture with pneumothorax and interrupted her healing with another fall in 06/2020. -She ambulates with walker and cane now and uses medical alert necklace. She was very active at home with chores, but not much lately due to concern with another fall. -She had DEXA years ago. She will proceed with new baseline DEXA on 08/25/21.  -She has not had a fall recently (06/03/21).     3. Comorbidities:  COPD, Esophageal spasms, heart murmur, PAD, Arthritis, Depression -She has been on Wellbutrin and Lexapro for longterm occasional depression. -She has arthritis in her hands but mainly in her back. She has received injections before, now controlled on extended release Tylenol BID. -Continue to F/u with PCP and vascular surgeon.  -She encouraged her to f/u with PCP soon about starting new medication, now that she is off Wellbutrin.    PLAN:  -Continue Tamoxifen 242mdaily  -Mammogram  and DEXA on 08/25/21  -Lab and F/u in 6 months. She will see Dr. ToMarlou Starksn 3-4 months   No problem-specific Assessment & Plan notes found for this encounter.   No orders of the defined types were placed in this encounter.  All questions were answered. The patient knows to call the clinic with any problems, questions or concerns. No barriers to learning was detected. The total time spent in the appointment was 30 minutes.     YaTruitt MerleMD 06/03/2021   I, AmJoslyn Devonam acting as scribe for YaTruitt MerleMD.   I have reviewed the above documentation for accuracy and completeness, and I agree with the above.

## 2021-06-01 ENCOUNTER — Other Ambulatory Visit: Payer: Self-pay

## 2021-06-01 DIAGNOSIS — Z170 Estrogen receptor positive status [ER+]: Secondary | ICD-10-CM

## 2021-06-03 ENCOUNTER — Other Ambulatory Visit: Payer: Self-pay

## 2021-06-03 ENCOUNTER — Inpatient Hospital Stay: Payer: Medicare PPO

## 2021-06-03 ENCOUNTER — Inpatient Hospital Stay: Payer: Medicare PPO | Attending: Hematology | Admitting: Hematology

## 2021-06-03 ENCOUNTER — Encounter: Payer: Self-pay | Admitting: Hematology

## 2021-06-03 VITALS — BP 114/52 | HR 88 | Temp 97.6°F | Resp 18 | Ht 61.0 in | Wt 157.0 lb

## 2021-06-03 DIAGNOSIS — Z923 Personal history of irradiation: Secondary | ICD-10-CM | POA: Diagnosis not present

## 2021-06-03 DIAGNOSIS — Z90710 Acquired absence of both cervix and uterus: Secondary | ICD-10-CM | POA: Diagnosis not present

## 2021-06-03 DIAGNOSIS — Z79810 Long term (current) use of selective estrogen receptor modulators (SERMs): Secondary | ICD-10-CM | POA: Diagnosis not present

## 2021-06-03 DIAGNOSIS — Z79899 Other long term (current) drug therapy: Secondary | ICD-10-CM | POA: Insufficient documentation

## 2021-06-03 DIAGNOSIS — C50211 Malignant neoplasm of upper-inner quadrant of right female breast: Secondary | ICD-10-CM | POA: Insufficient documentation

## 2021-06-03 DIAGNOSIS — Z170 Estrogen receptor positive status [ER+]: Secondary | ICD-10-CM

## 2021-06-03 DIAGNOSIS — Z7982 Long term (current) use of aspirin: Secondary | ICD-10-CM | POA: Diagnosis not present

## 2021-06-03 DIAGNOSIS — G4733 Obstructive sleep apnea (adult) (pediatric): Secondary | ICD-10-CM | POA: Insufficient documentation

## 2021-06-03 DIAGNOSIS — J449 Chronic obstructive pulmonary disease, unspecified: Secondary | ICD-10-CM | POA: Insufficient documentation

## 2021-06-03 LAB — CBC WITH DIFFERENTIAL (CANCER CENTER ONLY)
Abs Immature Granulocytes: 0.01 10*3/uL (ref 0.00–0.07)
Basophils Absolute: 0 10*3/uL (ref 0.0–0.1)
Basophils Relative: 1 %
Eosinophils Absolute: 0.2 10*3/uL (ref 0.0–0.5)
Eosinophils Relative: 3 %
HCT: 38.6 % (ref 36.0–46.0)
Hemoglobin: 12.8 g/dL (ref 12.0–15.0)
Immature Granulocytes: 0 %
Lymphocytes Relative: 18 %
Lymphs Abs: 1.2 10*3/uL (ref 0.7–4.0)
MCH: 30.8 pg (ref 26.0–34.0)
MCHC: 33.2 g/dL (ref 30.0–36.0)
MCV: 93 fL (ref 80.0–100.0)
Monocytes Absolute: 0.4 10*3/uL (ref 0.1–1.0)
Monocytes Relative: 6 %
Neutro Abs: 4.8 10*3/uL (ref 1.7–7.7)
Neutrophils Relative %: 72 %
Platelet Count: 163 10*3/uL (ref 150–400)
RBC: 4.15 MIL/uL (ref 3.87–5.11)
RDW: 13.1 % (ref 11.5–15.5)
WBC Count: 6.6 10*3/uL (ref 4.0–10.5)
nRBC: 0 % (ref 0.0–0.2)

## 2021-06-03 LAB — CMP (CANCER CENTER ONLY)
ALT: 13 U/L (ref 0–44)
AST: 17 U/L (ref 15–41)
Albumin: 3.9 g/dL (ref 3.5–5.0)
Alkaline Phosphatase: 39 U/L (ref 38–126)
Anion gap: 10 (ref 5–15)
BUN: 14 mg/dL (ref 8–23)
CO2: 28 mmol/L (ref 22–32)
Calcium: 9.3 mg/dL (ref 8.9–10.3)
Chloride: 102 mmol/L (ref 98–111)
Creatinine: 0.79 mg/dL (ref 0.44–1.00)
GFR, Estimated: >60 mL/min (ref >60)
Glucose, Bld: 118 mg/dL — ABNORMAL HIGH (ref 70–99)
Potassium: 3.9 mmol/L (ref 3.5–5.1)
Sodium: 140 mmol/L (ref 135–145)
Total Bilirubin: 0.6 mg/dL (ref 0.3–1.2)
Total Protein: 6.5 g/dL (ref 6.5–8.1)

## 2021-06-07 ENCOUNTER — Telehealth: Payer: Self-pay | Admitting: Hematology

## 2021-06-07 NOTE — Telephone Encounter (Signed)
Scheduled follow-up appointment per 6/17 los. Patient is aware. 

## 2021-07-01 DIAGNOSIS — H33322 Round hole, left eye: Secondary | ICD-10-CM | POA: Diagnosis not present

## 2021-07-01 DIAGNOSIS — H353211 Exudative age-related macular degeneration, right eye, with active choroidal neovascularization: Secondary | ICD-10-CM | POA: Diagnosis not present

## 2021-07-01 DIAGNOSIS — H43813 Vitreous degeneration, bilateral: Secondary | ICD-10-CM | POA: Diagnosis not present

## 2021-07-01 DIAGNOSIS — H353122 Nonexudative age-related macular degeneration, left eye, intermediate dry stage: Secondary | ICD-10-CM | POA: Diagnosis not present

## 2021-07-12 DIAGNOSIS — F410 Panic disorder [episodic paroxysmal anxiety] without agoraphobia: Secondary | ICD-10-CM | POA: Diagnosis not present

## 2021-07-12 DIAGNOSIS — F331 Major depressive disorder, recurrent, moderate: Secondary | ICD-10-CM | POA: Diagnosis not present

## 2021-07-29 DIAGNOSIS — H353211 Exudative age-related macular degeneration, right eye, with active choroidal neovascularization: Secondary | ICD-10-CM | POA: Diagnosis not present

## 2021-07-29 DIAGNOSIS — H43813 Vitreous degeneration, bilateral: Secondary | ICD-10-CM | POA: Diagnosis not present

## 2021-07-29 DIAGNOSIS — H353122 Nonexudative age-related macular degeneration, left eye, intermediate dry stage: Secondary | ICD-10-CM | POA: Diagnosis not present

## 2021-08-25 ENCOUNTER — Ambulatory Visit
Admission: RE | Admit: 2021-08-25 | Discharge: 2021-08-25 | Disposition: A | Discharge status: Home / Self Care | Payer: Medicare PPO | Source: Ambulatory Visit | Attending: Nurse Practitioner | Admitting: Nurse Practitioner

## 2021-08-25 ENCOUNTER — Other Ambulatory Visit: Payer: Self-pay

## 2021-08-25 DIAGNOSIS — C50211 Malignant neoplasm of upper-inner quadrant of right female breast: Secondary | ICD-10-CM

## 2021-08-25 DIAGNOSIS — Z780 Asymptomatic menopausal state: Secondary | ICD-10-CM | POA: Diagnosis not present

## 2021-08-25 DIAGNOSIS — Z170 Estrogen receptor positive status [ER+]: Secondary | ICD-10-CM

## 2021-08-25 DIAGNOSIS — Z853 Personal history of malignant neoplasm of breast: Secondary | ICD-10-CM | POA: Diagnosis not present

## 2021-08-25 DIAGNOSIS — R922 Inconclusive mammogram: Secondary | ICD-10-CM | POA: Diagnosis not present

## 2021-09-13 DIAGNOSIS — H353211 Exudative age-related macular degeneration, right eye, with active choroidal neovascularization: Secondary | ICD-10-CM | POA: Diagnosis not present

## 2021-09-13 DIAGNOSIS — H43813 Vitreous degeneration, bilateral: Secondary | ICD-10-CM | POA: Diagnosis not present

## 2021-09-13 DIAGNOSIS — H33322 Round hole, left eye: Secondary | ICD-10-CM | POA: Diagnosis not present

## 2021-09-13 DIAGNOSIS — H353122 Nonexudative age-related macular degeneration, left eye, intermediate dry stage: Secondary | ICD-10-CM | POA: Diagnosis not present

## 2021-10-03 DIAGNOSIS — G4733 Obstructive sleep apnea (adult) (pediatric): Secondary | ICD-10-CM | POA: Diagnosis not present

## 2021-10-03 DIAGNOSIS — K219 Gastro-esophageal reflux disease without esophagitis: Secondary | ICD-10-CM | POA: Diagnosis not present

## 2021-10-03 DIAGNOSIS — C50211 Malignant neoplasm of upper-inner quadrant of right female breast: Secondary | ICD-10-CM | POA: Diagnosis not present

## 2021-10-03 DIAGNOSIS — M5451 Vertebrogenic low back pain: Secondary | ICD-10-CM | POA: Diagnosis not present

## 2021-10-03 DIAGNOSIS — I739 Peripheral vascular disease, unspecified: Secondary | ICD-10-CM | POA: Diagnosis not present

## 2021-10-03 DIAGNOSIS — Z8781 Personal history of (healed) traumatic fracture: Secondary | ICD-10-CM | POA: Diagnosis not present

## 2021-10-03 DIAGNOSIS — E782 Mixed hyperlipidemia: Secondary | ICD-10-CM | POA: Diagnosis not present

## 2021-10-03 DIAGNOSIS — R296 Repeated falls: Secondary | ICD-10-CM | POA: Diagnosis not present

## 2021-10-03 DIAGNOSIS — J449 Chronic obstructive pulmonary disease, unspecified: Secondary | ICD-10-CM | POA: Diagnosis not present

## 2021-11-01 DIAGNOSIS — Z170 Estrogen receptor positive status [ER+]: Secondary | ICD-10-CM | POA: Diagnosis not present

## 2021-11-01 DIAGNOSIS — C50411 Malignant neoplasm of upper-outer quadrant of right female breast: Secondary | ICD-10-CM | POA: Diagnosis not present

## 2021-11-04 DIAGNOSIS — H43813 Vitreous degeneration, bilateral: Secondary | ICD-10-CM | POA: Diagnosis not present

## 2021-11-04 DIAGNOSIS — H353122 Nonexudative age-related macular degeneration, left eye, intermediate dry stage: Secondary | ICD-10-CM | POA: Diagnosis not present

## 2021-11-04 DIAGNOSIS — H33322 Round hole, left eye: Secondary | ICD-10-CM | POA: Diagnosis not present

## 2021-11-04 DIAGNOSIS — H353211 Exudative age-related macular degeneration, right eye, with active choroidal neovascularization: Secondary | ICD-10-CM | POA: Diagnosis not present

## 2021-11-16 DIAGNOSIS — R051 Acute cough: Secondary | ICD-10-CM | POA: Diagnosis not present

## 2021-11-16 DIAGNOSIS — Z20828 Contact with and (suspected) exposure to other viral communicable diseases: Secondary | ICD-10-CM | POA: Diagnosis not present

## 2021-12-02 ENCOUNTER — Inpatient Hospital Stay: Payer: Medicare PPO

## 2021-12-02 ENCOUNTER — Other Ambulatory Visit: Payer: Self-pay

## 2021-12-02 ENCOUNTER — Encounter: Payer: Self-pay | Admitting: Hematology

## 2021-12-02 ENCOUNTER — Inpatient Hospital Stay: Payer: Medicare PPO | Attending: Hematology | Admitting: Hematology

## 2021-12-02 VITALS — BP 135/68 | HR 68 | Temp 98.1°F | Resp 18 | Ht 61.0 in | Wt 149.8 lb

## 2021-12-02 DIAGNOSIS — G4733 Obstructive sleep apnea (adult) (pediatric): Secondary | ICD-10-CM | POA: Insufficient documentation

## 2021-12-02 DIAGNOSIS — Z923 Personal history of irradiation: Secondary | ICD-10-CM | POA: Diagnosis not present

## 2021-12-02 DIAGNOSIS — Z170 Estrogen receptor positive status [ER+]: Secondary | ICD-10-CM | POA: Diagnosis not present

## 2021-12-02 DIAGNOSIS — C50211 Malignant neoplasm of upper-inner quadrant of right female breast: Secondary | ICD-10-CM | POA: Diagnosis not present

## 2021-12-02 DIAGNOSIS — F32A Depression, unspecified: Secondary | ICD-10-CM | POA: Insufficient documentation

## 2021-12-02 DIAGNOSIS — F419 Anxiety disorder, unspecified: Secondary | ICD-10-CM | POA: Diagnosis not present

## 2021-12-02 DIAGNOSIS — Z7982 Long term (current) use of aspirin: Secondary | ICD-10-CM | POA: Insufficient documentation

## 2021-12-02 DIAGNOSIS — Z79899 Other long term (current) drug therapy: Secondary | ICD-10-CM | POA: Insufficient documentation

## 2021-12-02 LAB — CMP (CANCER CENTER ONLY)
ALT: 9 U/L (ref 0–44)
AST: 14 U/L — ABNORMAL LOW (ref 15–41)
Albumin: 3.7 g/dL (ref 3.5–5.0)
Alkaline Phosphatase: 48 U/L (ref 38–126)
Anion gap: 8 (ref 5–15)
BUN: 15 mg/dL (ref 8–23)
CO2: 26 mmol/L (ref 22–32)
Calcium: 9 mg/dL (ref 8.9–10.3)
Chloride: 106 mmol/L (ref 98–111)
Creatinine: 0.73 mg/dL (ref 0.44–1.00)
GFR, Estimated: >60 mL/min (ref >60)
Glucose, Bld: 120 mg/dL — ABNORMAL HIGH (ref 70–99)
Potassium: 4 mmol/L (ref 3.5–5.1)
Sodium: 140 mmol/L (ref 135–145)
Total Bilirubin: 0.4 mg/dL (ref 0.3–1.2)
Total Protein: 6.2 g/dL — ABNORMAL LOW (ref 6.5–8.1)

## 2021-12-02 LAB — CBC WITH DIFFERENTIAL (CANCER CENTER ONLY)
Abs Immature Granulocytes: 0.01 10*3/uL (ref 0.00–0.07)
Basophils Absolute: 0 10*3/uL (ref 0.0–0.1)
Basophils Relative: 1 %
Eosinophils Absolute: 0.2 10*3/uL (ref 0.0–0.5)
Eosinophils Relative: 3 %
HCT: 38.2 % (ref 36.0–46.0)
Hemoglobin: 12.9 g/dL (ref 12.0–15.0)
Immature Granulocytes: 0 %
Lymphocytes Relative: 14 %
Lymphs Abs: 0.9 10*3/uL (ref 0.7–4.0)
MCH: 31.2 pg (ref 26.0–34.0)
MCHC: 33.8 g/dL (ref 30.0–36.0)
MCV: 92.5 fL (ref 80.0–100.0)
Monocytes Absolute: 0.5 10*3/uL (ref 0.1–1.0)
Monocytes Relative: 8 %
Neutro Abs: 4.8 10*3/uL (ref 1.7–7.7)
Neutrophils Relative %: 74 %
Platelet Count: 157 10*3/uL (ref 150–400)
RBC: 4.13 MIL/uL (ref 3.87–5.11)
RDW: 12.7 % (ref 11.5–15.5)
WBC Count: 6.5 10*3/uL (ref 4.0–10.5)
nRBC: 0 % (ref 0.0–0.2)

## 2021-12-02 MED ORDER — TAMOXIFEN CITRATE 20 MG PO TABS: 20.0000 mg | ORAL_TABLET | Freq: Every day | ORAL | 3 refills | Status: DC

## 2021-12-02 NOTE — Progress Notes (Signed)
[place]   Telephone:(336) [phone] Fax:(336) [phone]   Clinic Follow up Note   Patient Care Team: Cari Caraway, MD as PCP - General (Family Medicine) Mauro Kaufmann, RN as Oncology Nurse Navigator Rockwell Germany, RN as Oncology Nurse Navigator Jovita Kussmaul, MD as Consulting Physician (General Surgery) Truitt Merle, MD as Consulting Physician (Hematology) Eppie Gibson, MD as Attending Physician (Radiation Oncology) Alla Feeling, NP as Nurse Practitioner (Nurse Practitioner)  Date of Service:  12/02/2021  CHIEF COMPLAINT: f/u of right breast cancer  CURRENT THERAPY:  Tamoxifen 41m once daily starting in 12/2020.  ASSESSMENT & PLAN:  Kirsten HAGMANNis a 87y.o. female with   1. Malignant neoplasm of upper-inner quadrant of right breast, Stage IA, P(T1cNxM0), ER+/PR+/HER2-, Grade II -She was diagnosed in 08/2020. She underwent right breast lumpectomy with Dr TMarlou Starkson 09/30/20. Her surgical pathology showed: 1.3cm invasive cancer, G2, with low grade DCIS; positive anterior margins with DCIS.   -She completed Radiation with Dr SIsidore Moos11/21/21-12/20/21.  -she started Tamoxifen 273min 12/2020. She is tolerating well. -most recent mammogram 08/25/21 was benign. -She is clinically doing well. Lab reviewed, her CBC and CMP are within normal limits. Her physical exam was unremarkable. There is no clinical concern for recurrence. -Continue Surveillance. Next mammogram 08/2022.  -F/u in 9 months, as she will see Dr. ToMarlou Starksn 04/2022.    2. History of falls, Bone Health  -She has had fall in 2020-2021.Her worst fall was in 02/2020 which results in rib fracture with pneumothorax and interrupted her healing with another fall in 06/2020. -She ambulates with walker and cane now and uses medical alert necklace. She was very active at home with chores, but not much lately due to concern with another fall. -DEXA on 08/25/21 was normal (T-score -1.0), I reviewed with her    3.  Comorbidities:  COPD, Esophageal spasms, heart murmur, PAD, Arthritis, Depression -She has been on Wellbutrin and Lexapro for longterm occasional depression, off Wellbutrin now due to interaction with tamoxifen. -She has arthritis in her hands but mainly in her back. She has received injections before, now controlled on extended release Tylenol BID. -Continue to F/u with PCP and vascular surgeon.      PLAN:  -Continue Tamoxifen 2040maily, I refilled today -f/u with Dr. TotMarlou Starks2023 -mammogram due 08/2022 -lab and f/u after next mammogram   No problem-specific Assessment & Plan notes found for this encounter.   SUMMARY OF ONCOLOGIC HISTORY: Oncology History Overview Note  Cancer Staging Malignant neoplasm of upper-inner quadrant of right breast in female, estrogen receptor positive ([place]CCLower [place] Villag[place]aging form: Breast, AJCC 8th Edition - Clinical stage from 08/31/2020: Stage IA (cT1b, cN0, cM0, G2, ER+, PR+, HER2-) - Signed by FenTruitt MerleD on 09/07/2020    Malignant neoplasm of upper-inner quadrant of right breast in female, estrogen receptor positive ([facility] Mammogram   IMPRESSION: Interval increase in size of an asymmetry in the slightly medial right breast. Patient was unable to tolerate an mL view today due to a rash underneath her breast. There is a possible subtle sonographic correlate at 4:30 o'clock 6 cm from the nipple. This measures 0.9 x 0.7 x 0.6 cm.   08/31/2020 Cancer Staging   Staging form: Breast, AJCC 8th Edition - Clinical stage from 08/31/2020: Stage IA (cT1b, cN0, cM0, G2, ER+, PR+, HER2-) - Signed by FenTruitt MerleD on 09/07/2020    08/31/2020 Initial Biopsy   Diagnosis Breast, right, needle core biopsy,  LIQ, coil clip - INVASIVE DUCTAL CARCINOMA - SEE COMMENT Microscopic Comment Based on the biopsy, the carcinoma appears Nottingham grade 2 of 3, has foci of extracellular mucin and measures 1 cm in greatest linear extent. Prognostic markers (ER/PR/ki-67/HER2)  are pending and will be reported in an addendum. Dr. Jeannie Done reviewed the case and agrees with the above diagnosis. These results were called to [facility] on September 01, 2020.   08/31/2020 Receptors her2   ADDITIONAL INFORMATION: Her2 is NEGATIVE (0).   Results: IMMUNOHISTOCHEMICAL AND MORPHOMETRIC ANALYSIS PERFORMED MANUALLY Estrogen Receptor: 95%, POSITIVE, STRONG STAINING INTENSITY Progesterone Receptor: 95%, POSITIVE, STRONG STAINING INTENSITY Proliferation Marker Ki67: 10%   09/03/2020 Initial Diagnosis   Malignant neoplasm of upper-inner quadrant of right breast in female, estrogen receptor positive ([place])   09/30/2020 Surgery   RIGHT BREAST LUMPECTOMY WITH RADIOACTIVE SEED LOCALIZATION by Dr Marlou Starks   09/30/2020 Pathology Results   FINAL MICROSCOPIC DIAGNOSIS:   A. BREAST, RIGHT, LUMPECTOMY:  - Invasive ductal carcinoma with extracellular mucin, 1.3 cm, grade 2  - Ductal carcinoma in situ low-grade  - Resection margins are negative for invasive carcinoma; closest  anterior margin at less than 1 mm and lateral margin at 2 mm  - DCIS focally involves the anterior margin and is 1 mm from the lateral  margin  - Negative for lymphovascular perineural invasion  - Biopsy site changes  - See oncology table    09/30/2020 Cancer Staging   Staging form: Breast, AJCC 8th Edition - Pathologic stage from 09/30/2020: Stage Unknown (pT1c, pNX, cM0, G2, ER+, PR+, HER2-) - Signed by Truitt Merle, MD on 01/13/2021    11/07/2020 - 12/06/2020 Radiation Therapy   Adjuvant Radiation with Dr Isidore Moos    12/2020 -  Anti-estrogen oral therapy   Tamoxifen 20m once daily starting in 12/2020.    03/03/2021 Survivorship   SCP delivered virtually by LCira Rue NP       INTERVAL HISTORY:  Kirsten OSCARis here for a follow up of breast cancer. She was last seen by me on 06/03/21. She presents to the clinic accompanied by her daughter. She reports she had hot flashes which  have since improved.   All other systems were reviewed with the patient and are negative.  MEDICAL HISTORY:  Past Medical History:  Diagnosis Date   Anxiety    Back pain    Cancer (HCC)    Depression    Emphysema    Esophageal spasm    Lung nodule    OSA (obstructive sleep apnea)    Osteomalacia    Vertigo    Vitamin D deficiency     SURGICAL HISTORY: Past Surgical History:  Procedure Laterality Date   ABDOMINAL HYSTERECTOMY     APPENDECTOMY     BREAST EXCISIONAL BIOPSY Left 1978   BREAST LUMPECTOMY WITH RADIOACTIVE SEED LOCALIZATION Right 09/30/2020   Procedure: RIGHT BREAST LUMPECTOMY WITH RADIOACTIVE SEED LOCALIZATION;  Surgeon: TJovita Kussmaul MD;  [place] [facility]  Service: General;  Laterality: Right;   CARPAL TUNNEL RELEASE     ROTATOR CUFF REPAIR      I have reviewed the social history and family history with the patient and they are unchanged from previous note.  ALLERGIES:  is allergic to adhesive [tape] and latex.  MEDICATIONS:  Current Outpatient Medications  Medication Sig Dispense Refill   acetaminophen (TYLENOL) 650 MG CR tablet Take 650 mg by mouth 2 (two) times daily as needed for pain.  albuterol (PROVENTIL HFA;VENTOLIN HFA) 108 (90 Base) MCG/ACT inhaler Inhale 2 puffs into the lungs every 6 (six) hours as needed for wheezing or shortness of breath.     ALPRAZolam (XANAX) 0.25 MG tablet Take 0.25 mg by mouth daily as needed. (Patient not taking: Reported on 01/20/2021)     aspirin EC 81 MG tablet Take 81 mg by mouth daily.     atorvastatin (LIPITOR) 10 MG tablet Take 10 mg by mouth at bedtime.     Cholecalciferol (VITAMIN D) 2000 units tablet Take 4,000 Units by mouth daily.      escitalopram (LEXAPRO) 20 MG tablet Take 40 mg by mouth daily.  2   fluticasone (FLONASE) 50 MCG/ACT nasal spray Place 2 sprays into both nostrils daily.     HYDROcodone-acetaminophen (NORCO/VICODIN) 5-325 MG tablet Take 1-2 tablets by mouth every 6 (six)  hours as needed for moderate pain or severe pain. (Patient not taking: Reported on 01/20/2021) 10 tablet 0   Multiple Vitamin (MULTIVITAMIN WITH MINERALS) TABS tablet Take 1 tablet by mouth daily.     omeprazole (PRILOSEC) 40 MG capsule Take 40 mg by mouth daily.     Polyethyl Glycol-Propyl Glycol (SYSTANE) 0.4-0.3 % SOLN Place 1 drop into both eyes daily as needed (dry eyes).     tamoxifen (NOLVADEX) 20 MG tablet Take 1 tablet (20 mg total) by mouth daily. 90 tablet 3   tiotropium (SPIRIVA) 18 MCG inhalation capsule Place 18 mcg into inhaler and inhale daily.     No current facility-administered medications for this visit.    PHYSICAL EXAMINATION: ECOG PERFORMANCE STATUS: 1 - Symptomatic but completely ambulatory  Vitals:   12/02/21 1512  BP: 135/68  Pulse: 68  Resp: 18  Temp: 98.1 F (36.7 C)  SpO2: 96%   Wt Readings from Last 3 Encounters:  12/02/21 149 lb 12.8 oz (67.9 kg)  06/03/21 157 lb (71.2 kg)  01/20/21 160 lb (72.6 kg)     GENERAL:alert, no distress and comfortable SKIN: skin color, texture, turgor are normal, no rashes or significant lesions EYES: normal, Conjunctiva are pink and non-injected, sclera clear  NECK: supple, thyroid normal size, non-tender, without nodularity LYMPH:  no palpable lymphadenopathy in the cervical, axillary  LUNGS: clear to auscultation and percussion with normal breathing effort HEART: regular rate & rhythm and no murmurs and no lower extremity edema ABDOMEN:abdomen soft, non-tender and normal bowel sounds Musculoskeletal:no cyanosis of digits and no clubbing  NEURO: alert & oriented x 3 with fluent speech, no focal motor/sensory deficits BREAST: No palpable mass, nodules or adenopathy bilaterally. Breast exam benign.   LABORATORY DATA:  I have reviewed the data as listed CBC Latest Ref Rng & Units 12/02/2021 06/03/2021 09/08/2020  WBC 4.0 - 10.5 K/uL 6.5 6.6 7.9  Hemoglobin 12.0 - 15.0 g/dL 12.9 12.8 13.6  Hematocrit 36.0 - 46.0 % 38.2  38.6 41.6  Platelets 150 - 400 K/uL 157 163 164     CMP Latest Ref Rng & Units 12/02/2021 06/03/2021 09/08/2020  Glucose 70 - 99 mg/dL 120(H) 118(H) 94  BUN 8 - 23 mg/dL _0 Creatinine 0.44 - 1.00 mg/dL 0.73 0.79 0.80  Sodium 135 - 145 mmol/L 140 140 138  Potassium 3.5 - 5.1 mmol/L 4.0 3.9 4.1  Chloride 98 - 111 mmol/L 106 102 101  CO2 22 - 32 mmol/L 26 28 32  Calcium 8.9 - 10.3 mg/dL 9.0 9.3 9.7  Total Protein 6.5 - 8.1 g/dL 6.2(L) 6.5 6.9  Total Bilirubin 0.3 -  1.2 mg/dL 0.4 0.6 0.5  Alkaline Phos 38 - 126 U/L 48 39 74  AST 15 - 41 U/L 14(L) 17 15  ALT 0 - 44 U/L _0 RADIOGRAPHIC STUDIES: I have personally reviewed the radiological images as listed and agreed with the findings in the report. No results found.    Orders Placed This Encounter  Procedures   MM DIAG BREAST TOMO BILATERAL    Standing Status:   Future    Standing Expiration Date:   12/02/2022    Order Specific Question:   Reason for Exam (SYMPTOM  OR DIAGNOSIS REQUIRED)    Answer:   screening    Order Specific Question:   Preferred imaging location?    Answer:   [facility]   All questions were answered. The patient knows to call the clinic with any problems, questions or concerns. No barriers to learning was detected. The total time spent in the appointment was 30 minutes.     Truitt Merle, MD 12/02/2021   I, Wilburn Mylar, am acting as scribe for Truitt Merle, MD.   I have reviewed the above documentation for accuracy and completeness, and I agree with the above.

## 2021-12-08 DIAGNOSIS — F331 Major depressive disorder, recurrent, moderate: Secondary | ICD-10-CM | POA: Diagnosis not present

## 2021-12-08 DIAGNOSIS — F410 Panic disorder [episodic paroxysmal anxiety] without agoraphobia: Secondary | ICD-10-CM | POA: Diagnosis not present

## 2022-01-13 DIAGNOSIS — H43813 Vitreous degeneration, bilateral: Secondary | ICD-10-CM | POA: Diagnosis not present

## 2022-01-13 DIAGNOSIS — H353211 Exudative age-related macular degeneration, right eye, with active choroidal neovascularization: Secondary | ICD-10-CM | POA: Diagnosis not present

## 2022-01-13 DIAGNOSIS — H33322 Round hole, left eye: Secondary | ICD-10-CM | POA: Diagnosis not present

## 2022-01-13 DIAGNOSIS — H353122 Nonexudative age-related macular degeneration, left eye, intermediate dry stage: Secondary | ICD-10-CM | POA: Diagnosis not present

## 2022-01-26 ENCOUNTER — Encounter (HOSPITAL_COMMUNITY): Payer: Self-pay

## 2022-01-26 DIAGNOSIS — I739 Peripheral vascular disease, unspecified: Secondary | ICD-10-CM | POA: Diagnosis not present

## 2022-01-26 DIAGNOSIS — R5381 Other malaise: Secondary | ICD-10-CM | POA: Diagnosis not present

## 2022-01-26 DIAGNOSIS — R630 Anorexia: Secondary | ICD-10-CM | POA: Diagnosis not present

## 2022-01-26 DIAGNOSIS — R269 Unspecified abnormalities of gait and mobility: Secondary | ICD-10-CM | POA: Diagnosis not present

## 2022-02-02 DIAGNOSIS — R262 Difficulty in walking, not elsewhere classified: Secondary | ICD-10-CM | POA: Diagnosis not present

## 2022-02-02 DIAGNOSIS — R5381 Other malaise: Secondary | ICD-10-CM | POA: Diagnosis not present

## 2022-02-02 DIAGNOSIS — R269 Unspecified abnormalities of gait and mobility: Secondary | ICD-10-CM | POA: Diagnosis not present

## 2022-02-06 DIAGNOSIS — R262 Difficulty in walking, not elsewhere classified: Secondary | ICD-10-CM | POA: Diagnosis not present

## 2022-02-06 DIAGNOSIS — R269 Unspecified abnormalities of gait and mobility: Secondary | ICD-10-CM | POA: Diagnosis not present

## 2022-02-06 DIAGNOSIS — R5381 Other malaise: Secondary | ICD-10-CM | POA: Diagnosis not present

## 2022-02-09 DIAGNOSIS — R5381 Other malaise: Secondary | ICD-10-CM | POA: Diagnosis not present

## 2022-02-09 DIAGNOSIS — R269 Unspecified abnormalities of gait and mobility: Secondary | ICD-10-CM | POA: Diagnosis not present

## 2022-02-09 DIAGNOSIS — R262 Difficulty in walking, not elsewhere classified: Secondary | ICD-10-CM | POA: Diagnosis not present

## 2022-02-13 DIAGNOSIS — R262 Difficulty in walking, not elsewhere classified: Secondary | ICD-10-CM | POA: Diagnosis not present

## 2022-02-13 DIAGNOSIS — R269 Unspecified abnormalities of gait and mobility: Secondary | ICD-10-CM | POA: Diagnosis not present

## 2022-02-13 DIAGNOSIS — R5381 Other malaise: Secondary | ICD-10-CM | POA: Diagnosis not present

## 2022-02-21 DIAGNOSIS — F411 Generalized anxiety disorder: Secondary | ICD-10-CM | POA: Diagnosis not present

## 2022-02-21 DIAGNOSIS — F3289 Other specified depressive episodes: Secondary | ICD-10-CM | POA: Diagnosis not present

## 2022-02-23 DIAGNOSIS — R269 Unspecified abnormalities of gait and mobility: Secondary | ICD-10-CM | POA: Diagnosis not present

## 2022-02-23 DIAGNOSIS — R262 Difficulty in walking, not elsewhere classified: Secondary | ICD-10-CM | POA: Diagnosis not present

## 2022-02-23 DIAGNOSIS — R5381 Other malaise: Secondary | ICD-10-CM | POA: Diagnosis not present

## 2022-02-27 DIAGNOSIS — R262 Difficulty in walking, not elsewhere classified: Secondary | ICD-10-CM | POA: Diagnosis not present

## 2022-02-27 DIAGNOSIS — R5381 Other malaise: Secondary | ICD-10-CM | POA: Diagnosis not present

## 2022-02-27 DIAGNOSIS — R269 Unspecified abnormalities of gait and mobility: Secondary | ICD-10-CM | POA: Diagnosis not present

## 2022-02-28 DIAGNOSIS — F410 Panic disorder [episodic paroxysmal anxiety] without agoraphobia: Secondary | ICD-10-CM | POA: Diagnosis not present

## 2022-02-28 DIAGNOSIS — F331 Major depressive disorder, recurrent, moderate: Secondary | ICD-10-CM | POA: Diagnosis not present

## 2022-03-02 DIAGNOSIS — R5381 Other malaise: Secondary | ICD-10-CM | POA: Diagnosis not present

## 2022-03-02 DIAGNOSIS — F331 Major depressive disorder, recurrent, moderate: Secondary | ICD-10-CM | POA: Diagnosis not present

## 2022-03-02 DIAGNOSIS — R269 Unspecified abnormalities of gait and mobility: Secondary | ICD-10-CM | POA: Diagnosis not present

## 2022-03-02 DIAGNOSIS — F410 Panic disorder [episodic paroxysmal anxiety] without agoraphobia: Secondary | ICD-10-CM | POA: Diagnosis not present

## 2022-03-02 DIAGNOSIS — R262 Difficulty in walking, not elsewhere classified: Secondary | ICD-10-CM | POA: Diagnosis not present

## 2022-03-06 DIAGNOSIS — R262 Difficulty in walking, not elsewhere classified: Secondary | ICD-10-CM | POA: Diagnosis not present

## 2022-03-06 DIAGNOSIS — R269 Unspecified abnormalities of gait and mobility: Secondary | ICD-10-CM | POA: Diagnosis not present

## 2022-03-06 DIAGNOSIS — R5381 Other malaise: Secondary | ICD-10-CM | POA: Diagnosis not present

## 2022-03-09 DIAGNOSIS — R262 Difficulty in walking, not elsewhere classified: Secondary | ICD-10-CM | POA: Diagnosis not present

## 2022-03-09 DIAGNOSIS — R269 Unspecified abnormalities of gait and mobility: Secondary | ICD-10-CM | POA: Diagnosis not present

## 2022-03-09 DIAGNOSIS — R5381 Other malaise: Secondary | ICD-10-CM | POA: Diagnosis not present

## 2022-03-13 DIAGNOSIS — B078 Other viral warts: Secondary | ICD-10-CM | POA: Diagnosis not present

## 2022-03-13 DIAGNOSIS — Z85828 Personal history of other malignant neoplasm of skin: Secondary | ICD-10-CM | POA: Diagnosis not present

## 2022-03-13 DIAGNOSIS — D0462 Carcinoma in situ of skin of left upper limb, including shoulder: Secondary | ICD-10-CM | POA: Diagnosis not present

## 2022-03-13 DIAGNOSIS — D485 Neoplasm of uncertain behavior of skin: Secondary | ICD-10-CM | POA: Diagnosis not present

## 2022-03-13 DIAGNOSIS — C44629 Squamous cell carcinoma of skin of left upper limb, including shoulder: Secondary | ICD-10-CM | POA: Diagnosis not present

## 2022-03-16 DIAGNOSIS — Z1389 Encounter for screening for other disorder: Secondary | ICD-10-CM | POA: Diagnosis not present

## 2022-03-16 DIAGNOSIS — R262 Difficulty in walking, not elsewhere classified: Secondary | ICD-10-CM | POA: Diagnosis not present

## 2022-03-16 DIAGNOSIS — R269 Unspecified abnormalities of gait and mobility: Secondary | ICD-10-CM | POA: Diagnosis not present

## 2022-03-16 DIAGNOSIS — Z0000 Encounter for general adult medical examination without abnormal findings: Secondary | ICD-10-CM | POA: Diagnosis not present

## 2022-03-16 DIAGNOSIS — R5381 Other malaise: Secondary | ICD-10-CM | POA: Diagnosis not present

## 2022-03-20 DIAGNOSIS — R262 Difficulty in walking, not elsewhere classified: Secondary | ICD-10-CM | POA: Diagnosis not present

## 2022-03-20 DIAGNOSIS — R5381 Other malaise: Secondary | ICD-10-CM | POA: Diagnosis not present

## 2022-03-20 DIAGNOSIS — R269 Unspecified abnormalities of gait and mobility: Secondary | ICD-10-CM | POA: Diagnosis not present

## 2022-03-22 DIAGNOSIS — F410 Panic disorder [episodic paroxysmal anxiety] without agoraphobia: Secondary | ICD-10-CM | POA: Diagnosis not present

## 2022-03-22 DIAGNOSIS — F331 Major depressive disorder, recurrent, moderate: Secondary | ICD-10-CM | POA: Diagnosis not present

## 2022-03-23 DIAGNOSIS — E559 Vitamin D deficiency, unspecified: Secondary | ICD-10-CM | POA: Diagnosis not present

## 2022-03-23 DIAGNOSIS — R262 Difficulty in walking, not elsewhere classified: Secondary | ICD-10-CM | POA: Diagnosis not present

## 2022-03-23 DIAGNOSIS — J310 Chronic rhinitis: Secondary | ICD-10-CM | POA: Diagnosis not present

## 2022-03-23 DIAGNOSIS — R5381 Other malaise: Secondary | ICD-10-CM | POA: Diagnosis not present

## 2022-03-23 DIAGNOSIS — R296 Repeated falls: Secondary | ICD-10-CM | POA: Diagnosis not present

## 2022-03-23 DIAGNOSIS — F3341 Major depressive disorder, recurrent, in partial remission: Secondary | ICD-10-CM | POA: Diagnosis not present

## 2022-03-23 DIAGNOSIS — I739 Peripheral vascular disease, unspecified: Secondary | ICD-10-CM | POA: Diagnosis not present

## 2022-03-23 DIAGNOSIS — F331 Major depressive disorder, recurrent, moderate: Secondary | ICD-10-CM | POA: Diagnosis not present

## 2022-03-23 DIAGNOSIS — R269 Unspecified abnormalities of gait and mobility: Secondary | ICD-10-CM | POA: Diagnosis not present

## 2022-03-23 DIAGNOSIS — E782 Mixed hyperlipidemia: Secondary | ICD-10-CM | POA: Diagnosis not present

## 2022-03-23 DIAGNOSIS — F410 Panic disorder [episodic paroxysmal anxiety] without agoraphobia: Secondary | ICD-10-CM | POA: Diagnosis not present

## 2022-03-23 DIAGNOSIS — G4733 Obstructive sleep apnea (adult) (pediatric): Secondary | ICD-10-CM | POA: Diagnosis not present

## 2022-03-23 DIAGNOSIS — J449 Chronic obstructive pulmonary disease, unspecified: Secondary | ICD-10-CM | POA: Diagnosis not present

## 2022-04-07 DIAGNOSIS — H43813 Vitreous degeneration, bilateral: Secondary | ICD-10-CM | POA: Diagnosis not present

## 2022-04-07 DIAGNOSIS — H353122 Nonexudative age-related macular degeneration, left eye, intermediate dry stage: Secondary | ICD-10-CM | POA: Diagnosis not present

## 2022-04-07 DIAGNOSIS — H33322 Round hole, left eye: Secondary | ICD-10-CM | POA: Diagnosis not present

## 2022-04-07 DIAGNOSIS — H353211 Exudative age-related macular degeneration, right eye, with active choroidal neovascularization: Secondary | ICD-10-CM | POA: Diagnosis not present

## 2022-04-11 DIAGNOSIS — F410 Panic disorder [episodic paroxysmal anxiety] without agoraphobia: Secondary | ICD-10-CM | POA: Diagnosis not present

## 2022-04-11 DIAGNOSIS — F331 Major depressive disorder, recurrent, moderate: Secondary | ICD-10-CM | POA: Diagnosis not present

## 2022-04-27 DIAGNOSIS — F331 Major depressive disorder, recurrent, moderate: Secondary | ICD-10-CM | POA: Diagnosis not present

## 2022-04-27 DIAGNOSIS — F410 Panic disorder [episodic paroxysmal anxiety] without agoraphobia: Secondary | ICD-10-CM | POA: Diagnosis not present

## 2022-05-02 DIAGNOSIS — F410 Panic disorder [episodic paroxysmal anxiety] without agoraphobia: Secondary | ICD-10-CM | POA: Diagnosis not present

## 2022-05-02 DIAGNOSIS — F331 Major depressive disorder, recurrent, moderate: Secondary | ICD-10-CM | POA: Diagnosis not present

## 2022-05-04 DIAGNOSIS — C50411 Malignant neoplasm of upper-outer quadrant of right female breast: Secondary | ICD-10-CM | POA: Diagnosis not present

## 2022-05-04 DIAGNOSIS — Z170 Estrogen receptor positive status [ER+]: Secondary | ICD-10-CM | POA: Diagnosis not present

## 2022-05-06 ENCOUNTER — Emergency Department (HOSPITAL_COMMUNITY): Payer: Medicare PPO

## 2022-05-06 ENCOUNTER — Emergency Department (HOSPITAL_COMMUNITY)
Admission: EM | Admit: 2022-05-06 | Discharge: 2022-05-06 | Disposition: A | Discharge status: Home / Self Care | Payer: Medicare PPO | Attending: Emergency Medicine | Admitting: Emergency Medicine

## 2022-05-06 DIAGNOSIS — R29818 Other symptoms and signs involving the nervous system: Secondary | ICD-10-CM | POA: Diagnosis not present

## 2022-05-06 DIAGNOSIS — I10 Essential (primary) hypertension: Secondary | ICD-10-CM | POA: Diagnosis not present

## 2022-05-06 DIAGNOSIS — I619 Nontraumatic intracerebral hemorrhage, unspecified: Secondary | ICD-10-CM | POA: Diagnosis not present

## 2022-05-06 DIAGNOSIS — R531 Weakness: Secondary | ICD-10-CM | POA: Diagnosis not present

## 2022-05-06 DIAGNOSIS — Z8673 Personal history of transient ischemic attack (TIA), and cerebral infarction without residual deficits: Secondary | ICD-10-CM | POA: Diagnosis not present

## 2022-05-06 DIAGNOSIS — Z91040 Latex allergy status: Secondary | ICD-10-CM | POA: Diagnosis not present

## 2022-05-06 DIAGNOSIS — R42 Dizziness and giddiness: Secondary | ICD-10-CM | POA: Diagnosis not present

## 2022-05-06 DIAGNOSIS — Z79899 Other long term (current) drug therapy: Secondary | ICD-10-CM | POA: Diagnosis not present

## 2022-05-06 DIAGNOSIS — I639 Cerebral infarction, unspecified: Secondary | ICD-10-CM | POA: Insufficient documentation

## 2022-05-06 DIAGNOSIS — Z7982 Long term (current) use of aspirin: Secondary | ICD-10-CM | POA: Insufficient documentation

## 2022-05-06 DIAGNOSIS — I6782 Cerebral ischemia: Secondary | ICD-10-CM | POA: Diagnosis not present

## 2022-05-06 DIAGNOSIS — R0902 Hypoxemia: Secondary | ICD-10-CM | POA: Diagnosis not present

## 2022-05-06 LAB — URINALYSIS, ROUTINE W REFLEX MICROSCOPIC
Bilirubin Urine: NEGATIVE
Glucose, UA: NEGATIVE mg/dL
Ketones, ur: NEGATIVE mg/dL
Leukocytes,Ua: NEGATIVE
Nitrite: NEGATIVE
Protein, ur: NEGATIVE mg/dL
Specific Gravity, Urine: 1.006 (ref 1.005–1.030)
pH: 6 (ref 5.0–8.0)

## 2022-05-06 LAB — BASIC METABOLIC PANEL
Anion gap: 5 (ref 5–15)
BUN: 14 mg/dL (ref 8–23)
CO2: 26 mmol/L (ref 22–32)
Calcium: 8.4 mg/dL — ABNORMAL LOW (ref 8.9–10.3)
Chloride: 108 mmol/L (ref 98–111)
Creatinine, Ser: 0.71 mg/dL (ref 0.44–1.00)
GFR, Estimated: >60 mL/min (ref >60)
Glucose, Bld: 110 mg/dL — ABNORMAL HIGH (ref 70–99)
Potassium: 3.4 mmol/L — ABNORMAL LOW (ref 3.5–5.1)
Sodium: 139 mmol/L (ref 135–145)

## 2022-05-06 LAB — CBC
HCT: 36.5 % (ref 36.0–46.0)
Hemoglobin: 12.6 g/dL (ref 12.0–15.0)
MCH: 32.4 pg (ref 26.0–34.0)
MCHC: 34.5 g/dL (ref 30.0–36.0)
MCV: 93.8 fL (ref 80.0–100.0)
Platelets: 140 10*3/uL — ABNORMAL LOW (ref 150–400)
RBC: 3.89 MIL/uL (ref 3.87–5.11)
RDW: 13.1 % (ref 11.5–15.5)
WBC: 5.9 10*3/uL (ref 4.0–10.5)
nRBC: 0 % (ref 0.0–0.2)

## 2022-05-06 LAB — TROPONIN I (HIGH SENSITIVITY)
Troponin I (High Sensitivity): 12 ng/L (ref <18)
Troponin I (High Sensitivity): 11 ng/L (ref <18)

## 2022-05-06 LAB — CBG MONITORING, ED: Glucose-Capillary: 90 mg/dL (ref 70–99)

## 2022-05-06 NOTE — ED Notes (Signed)
Patient ambulated to the bathroom.

## 2022-05-06 NOTE — ED Provider Notes (Signed)
[facility] EMERGENCY DEPARTMENT Provider Note   CSN: [id number] Arrival date & time: 05/06/22  1740     History  Chief Complaint  Patient presents with   Weakness    [name] is a 88 y.o. female prsenting from home with transient left leg weakness. She felt healthy and normal today until she noted she suddenly felt lightheaded and her entire left leg was "heavy and weak."  This has completely resolved since her arrival in the ED.  Never happened before.  Denies hx of TIA, stroke, aneurysms.  Denies hx of smoking, HTN (typically has normal BP), diabetes.  Reports hx of HLD, family hx of CVA/TIA  Here with daughter  Lives independently  .  She re      Home Medications Prior to Admission medications   Medication Sig Start Date End Date Taking? Authorizing Provider  acetaminophen (TYLENOL) 650 MG CR tablet Take 650 mg by mouth 2 (two) times daily as needed for pain.     [provider]  albuterol (PROVENTIL HFA;VENTOLIN HFA) 108 (90 Base) MCG/ACT inhaler Inhale 2 puffs into the lungs every 6 (six) hours as needed for wheezing or shortness of breath.    [provider]  ALPRAZolam Duanne Moron) 0.25 MG tablet Take 0.25 mg by mouth daily as needed. Patient not taking: Reported on 01/20/2021 10/22/20   [provider]  aspirin EC 81 MG tablet Take 81 mg by mouth daily.    [provider]  atorvastatin (LIPITOR) 10 MG tablet Take 10 mg by mouth at bedtime.    [provider]  Cholecalciferol (VITAMIN D) 2000 units tablet Take 4,000 Units by mouth daily.     [provider]  escitalopram (LEXAPRO) 20 MG tablet Take 40 mg by mouth daily. 08/01/17   [provider]  fluticasone (FLONASE) 50 MCG/ACT nasal spray Place 2 sprays into both nostrils daily.    [provider]  HYDROcodone-acetaminophen (NORCO/VICODIN) 5-325 MG tablet Take 1-2 tablets by mouth every 6 (six) hours as needed for moderate pain  or severe pain. Patient not taking: Reported on 01/20/2021 09/30/20   Autumn Messing III, MD  Multiple Vitamin (MULTIVITAMIN WITH MINERALS) TABS tablet Take 1 tablet by mouth daily.    [provider]  omeprazole (PRILOSEC) 40 MG capsule Take 40 mg by mouth daily. 10/22/20   [provider]  Polyethyl Glycol-Propyl Glycol (SYSTANE) 0.4-0.3 % SOLN Place 1 drop into both eyes daily as needed (dry eyes).    [provider]  tamoxifen (NOLVADEX) 20 MG tablet Take 1 tablet (20 mg total) by mouth daily. 12/02/21   Truitt Merle, MD  tiotropium (SPIRIVA) 18 MCG inhalation capsule Place 18 mcg into inhaler and inhale daily.    [provider]      Allergies    Adhesive [tape] and Latex    Review of Systems   Review of Systems  Physical Exam Updated Vital Signs BP (!) 152/53   Pulse 63   Temp 98.3 F (36.8 C) (Oral)   Resp 17   SpO2 94%  Physical Exam Constitutional:      General: She is not in acute distress. HENT:     Head: Normocephalic and atraumatic.  Eyes:     Conjunctiva/sclera: Conjunctivae normal.     Pupils: Pupils are equal, round, and reactive to light.  Cardiovascular:     Rate and Rhythm: Normal rate and regular rhythm.  Pulmonary:     Effort: Pulmonary effort is normal.  No respiratory distress.  Abdominal:     General: There is no distension.     Tenderness: There is no abdominal tenderness.  Skin:    General: Skin is warm and dry.  Neurological:     General: No focal deficit present.     Mental Status: She is alert and oriented to person, place, and time. Mental status is at baseline.     Cranial Nerves: No cranial nerve deficit.     Sensory: No sensory deficit.     Motor: No weakness.     Gait: Gait normal.  Psychiatric:        Mood and Affect: Mood normal.        Behavior: Behavior normal.    ED Results / Procedures / Treatments   Labs (all labs ordered are listed, but only abnormal results are displayed) Labs Reviewed  BASIC  METABOLIC PANEL - Abnormal; Notable for the following components:      Result Value   Potassium 3.4 (*)    Glucose, Bld 110 (*)    Calcium 8.4 (*)    All other components within normal limits  CBC - Abnormal; Notable for the following components:   Platelets 140 (*)    All other components within normal limits  URINALYSIS, ROUTINE W REFLEX MICROSCOPIC - Abnormal; Notable for the following components:   Color, Urine STRAW (*)    Hgb urine dipstick MODERATE (*)    Bacteria, UA RARE (*)    All other components within normal limits  CBG MONITORING, ED  TROPONIN I (HIGH SENSITIVITY)    EKG EKG Interpretation  Date/Time:  Saturday May 06 2022 17:49:51 EDT Ventricular Rate:  68 PR Interval:  143 QRS Duration: 91 QT Interval:  405 QTC Calculation: 431 R Axis:   -20 Text Interpretation: Sinus rhythm Borderline left axis deviation Minor ST depressions lateral leads noted on prior imaging SEpt 2018, no significant changes Confirmed by Octaviano Glow [phone]) on 05/06/2022 6:19:02 PM  Radiology CT HEAD WO CONTRAST  Result Date: 05/06/2022 CLINICAL DATA:  Neuro deficit, acute, stroke suspected EXAM: CT HEAD WITHOUT CONTRAST TECHNIQUE: Contiguous axial images were obtained from the base of the skull through the vertex without intravenous contrast. RADIATION DOSE REDUCTION: This exam was performed according to the departmental dose-optimization program which includes automated exposure control, adjustment of the mA and/or kV according to patient size and/or use of iterative reconstruction technique. COMPARISON:  06/29/2020 FINDINGS: Brain: No evidence of acute infarction, hemorrhage, hydrocephalus, extra-axial collection or mass lesion/mass effect. Scattered low-density changes within the periventricular and subcortical white matter compatible with chronic microvascular ischemic change. Moderate diffuse cerebral volume loss. Vascular: No hyperdense vessel or unexpected calcification. Skull: Normal.  Negative for fracture or focal lesion. Sinuses/Orbits: No acute finding. Other: None. IMPRESSION: 1. No acute intracranial abnormality. 2. Chronic microvascular ischemic change and cerebral volume loss. Electronically Signed   By: Davina Poke D.O.   On: 05/06/2022 18:36    Procedures Procedures    Medications Ordered in ED Medications - No data to display  ED Course/ Medical Decision Making/ A&P Clinical Course as of 05/06/22 2114  Sat May 06, 2022  2017 No acute stroke on MRI [MT]    Clinical Course User Index [MT] Langston Masker Carola Rhine, MD                           Medical Decision Making Amount and/or Complexity of Data Reviewed Labs: ordered. Radiology: ordered.  This patient presents to the ED with concern for left leg weakness. This involves an extensive number of treatment options, and is a complaint that carries with it a high risk of complications and morbidity.  The differential diagnosis includes CVA vs TIA vs dehydration vs other  NIHSS 0 and benign neuro exam on arrival No abdominal pulsatile mass or risk factors for AAA - doubt AAA given resolution of symptoms.  Pulses brisk and equal in LE.  No back pain/radiculopathy to suspect spinal pathology.  I do not believe MRI spine indicated at this time.  No chest pain, tachycardia to suggest PE - not consistent with this presentation.  Co-morbidities that complicate the patient evaluation: HLD, age raise risk for TIA/CVA  I ordered and personally interpreted labs.  The pertinent results include: UA without evidence of infection, CBC and BMP largely unremarkable, Trop 12.  I ordered imaging studies including CT head, MRI brain I independently visualized and interpreted imaging which showed no acute infarct on CT, MRI showing no infarct I agree with the radiologist interpretation  The patient was maintained on a cardiac monitor.  I personally viewed and interpreted the cardiac monitored which showed an underlying  rhythm of: NSR  Per my interpretation the patient's ECG shows NSR with no acute ischemic findings  After the interventions noted above, I reevaluated the patient and found that they have: stayed the same  Asymptomatic in ED  Dispostion:  After consideration of the diagnostic results and the patients response to treatment, I feel that the patent would benefit from outpatient PCP f/u.         Final Clinical Impression(s) / ED Diagnoses Final diagnoses:  None    Rx / DC Orders ED Discharge Orders     None         Wyvonnia Dusky, MD 05/06/22 2119

## 2022-05-06 NOTE — ED Triage Notes (Signed)
Pt via [facility] for eval of sudden-onset bilateral leg weakness, worse on L leg. Pt ambulatory w EMS, reports decreased sensation to L leg. Stroke screen w EMS clear, pt A&O4, at baseline. Initially hypertensive 180 palp, down to 010 systolic on arrival to ED.   18LAC

## 2022-05-06 NOTE — Discharge Instructions (Addendum)
Please call your doctor's office Monday to schedule follow-up appointment.

## 2022-05-06 NOTE — ED Notes (Signed)
Patient transported to MRI 

## 2022-05-08 DIAGNOSIS — R269 Unspecified abnormalities of gait and mobility: Secondary | ICD-10-CM | POA: Diagnosis not present

## 2022-05-08 DIAGNOSIS — R55 Syncope and collapse: Secondary | ICD-10-CM | POA: Diagnosis not present

## 2022-05-08 DIAGNOSIS — F3341 Major depressive disorder, recurrent, in partial remission: Secondary | ICD-10-CM | POA: Diagnosis not present

## 2022-05-08 DIAGNOSIS — R5381 Other malaise: Secondary | ICD-10-CM | POA: Diagnosis not present

## 2022-05-10 ENCOUNTER — Telehealth: Payer: Self-pay

## 2022-05-10 NOTE — Telephone Encounter (Signed)
NOTES SCANNED TO REFERRAL 

## 2022-05-18 ENCOUNTER — Other Ambulatory Visit: Payer: Self-pay | Admitting: General Surgery

## 2022-05-18 DIAGNOSIS — N63 Unspecified lump in unspecified breast: Secondary | ICD-10-CM

## 2022-05-24 ENCOUNTER — Ambulatory Visit
Admission: RE | Admit: 2022-05-24 | Discharge: 2022-05-24 | Disposition: A | Discharge status: Home / Self Care | Payer: Medicare PPO | Source: Ambulatory Visit | Attending: General Surgery | Admitting: General Surgery

## 2022-05-24 DIAGNOSIS — Z853 Personal history of malignant neoplasm of breast: Secondary | ICD-10-CM | POA: Diagnosis not present

## 2022-05-24 DIAGNOSIS — N63 Unspecified lump in unspecified breast: Secondary | ICD-10-CM

## 2022-05-25 ENCOUNTER — Ambulatory Visit (HOSPITAL_BASED_OUTPATIENT_CLINIC_OR_DEPARTMENT_OTHER): Payer: Medicare PPO | Admitting: Cardiology

## 2022-05-25 ENCOUNTER — Encounter (HOSPITAL_BASED_OUTPATIENT_CLINIC_OR_DEPARTMENT_OTHER): Payer: Self-pay | Admitting: Cardiology

## 2022-05-25 VITALS — BP 139/82 | HR 69 | Ht 61.0 in | Wt 135.6 lb

## 2022-05-25 DIAGNOSIS — I493 Ventricular premature depolarization: Secondary | ICD-10-CM | POA: Diagnosis not present

## 2022-05-25 DIAGNOSIS — Z7189 Other specified counseling: Secondary | ICD-10-CM

## 2022-05-25 DIAGNOSIS — R42 Dizziness and giddiness: Secondary | ICD-10-CM | POA: Diagnosis not present

## 2022-05-25 DIAGNOSIS — I700 Atherosclerosis of aorta: Secondary | ICD-10-CM | POA: Diagnosis not present

## 2022-05-25 DIAGNOSIS — R634 Abnormal weight loss: Secondary | ICD-10-CM | POA: Diagnosis not present

## 2022-05-25 NOTE — Patient Instructions (Signed)
Medication Instructions:  Your Physician recommend you continue on your current medication as directed.    *If you need a refill on your cardiac medications before your next appointment, please call your pharmacy*   Lab Work: None ordered today   Testing/Procedures: Your physician has requested that you have an echocardiogram. Echocardiography is a painless test that uses sound waves to create images of your heart. It provides your doctor with information about the size and shape of your heart and how well your heart's chambers and valves are working. This procedure takes approximately one hour. There are no restrictions for this procedure. [place] has recommended that you wear a 7 day event monitor. Event monitors are medical devices that record the heart's electrical activity. Doctors most often [place] these monitors to diagnose arrhythmias. Arrhythmias are problems with the speed or rhythm of the heartbeat. The monitor is a small, portable device. You can wear one while you do your normal daily activities. This is usually used to diagnose what is causing palpitations/syncope (passing out).    Follow-Up: At [facility], you and your health needs are our priority.  As part of our continuing mission to provide you with exceptional heart care, we have created designated Provider Care Teams.  These Care Teams include your primary Cardiologist (physician) and Advanced Practice Providers (APPs -  Physician Assistants and Nurse Practitioners) who all work together to provide you with the care you need, when you need it.  We recommend signing up for the patient portal called "MyChart".  Sign up information is provided on this After Visit Summary.  MyChart is used to connect with patients for Virtual Visits (Telemedicine).  Patients are able to view lab/test results, encounter notes, upcoming appointments, etc.  Non-urgent messages can be sent to your provider  as well.   To learn more about what you can do with MyChart, go to [email or web address].    Your next appointment:   As needed  The format for your next appointment:   In Person  Provider:   Buford Dresser, MD

## 2022-05-25 NOTE — Progress Notes (Signed)
Cardiology Office Note:    Date:  05/25/2022   ID:  [name], DOB Mar 14, 1932, MRN [id number]  PCP:  Cari Caraway, MD  Cardiologist:  Buford Dresser, MD  Referring MD: Cari Caraway, MD   CC: new patient evaluation for orthostasis and syncope  History of Present Illness:    [name] is a 88 y.o. female with a hx of hyperlipidemia, COPD, emphysema, OSA, cancer, and esophageal spasm, who is seen as a new consult at the request of Cari Caraway, MD for the evaluation and management of presyncope and orthostasis.  Referral notes from Dr. Addison Lank personally reviewed. At their visit 05/08/22, she reported feeling like she was going to pass out at times. She had recently been in the ED on 05/06/22 before which her left leg was "giving away" with lightheadedness while doing laundry. She had walked 30 feet to support herself from a chair. EMS was called, her EKG showed some changes but no strong evidence of cardiac event. BP upon arrival was 152/62. Her workup was unrevealing and she was discharged home without diagnosis for her incident. At her visit with Dr. Addison Lank it was noted she was still struggling with drinking adequately and was dealing with medication-resistant depression. Also noted to have a history of frequent falls. She asked for cardiology referral due to significant anxiety for possible repeat episodes of weakness/presyncope.  Today, she is accompanied by her daughter who also provides some history. Initially she was lying down on the exam table and states she is feeling fine. When she sat upright for the physical exam she felt "pretty dizzy."  Syncope: Initial episode: About 2 years ago she passed out, resulting in rib fractures and a punctured lung. Most recently 2 weeks ago her legs suddenly felt weak and she was presyncopal. Frequency: Not very frequent.  Duration of episodes: brief Presyncopal symptoms: Periodic dizzy spells. Elevated blood pressures. Prior  workup: Years ago, she was told her heart was enlarged but workup was reportedly fine. This had been prompted by an episode of feeling like she was having a heart attack, but later found to be due to esophageal spasms, which she has occasionally. ECG: Previously in the ED her daughter felt there were concerning events on her EKG, and wished to follow up with cardiology. Family history: Strokes and heart disease. Her mother suddenly passed out and needed a pacemaker. Her sister has atrial fibrillation.  However, she has generally been feeling unwell, and suffering from depression. She has not been eating well with a decreased appetite, and has lost at least 20 lbs. Her daughter notes that she is doing better than she was previously, but is still struggling with getting enough calories.  Occasionally she has noticed a hard heartbeat, but not very often.  She endorses some hematuria normally that is detectable with testing.  Of note, she is allergic to adhesive.  She denies any chest pain, shortness of breath, or peripheral edema. No headaches, orthopnea, or PND.  Past Medical History:  Diagnosis Date   Anxiety    Back pain    Cancer (HCC)    Depression    Emphysema    Esophageal spasm    Lung nodule    OSA (obstructive sleep apnea)    Osteomalacia    Vertigo    Vitamin D deficiency     Past Surgical History:  Procedure Laterality Date   ABDOMINAL HYSTERECTOMY     APPENDECTOMY     BREAST EXCISIONAL BIOPSY Left 1978  BREAST LUMPECTOMY WITH RADIOACTIVE SEED LOCALIZATION Right 09/30/2020   Procedure: RIGHT BREAST LUMPECTOMY WITH RADIOACTIVE SEED LOCALIZATION;  Surgeon: Jovita Kussmaul, MD;  Location: [place];  Service: General;  Laterality: Right;   CARPAL TUNNEL RELEASE     ROTATOR CUFF REPAIR      Current Medications: Current Outpatient Medications on File Prior to Visit  Medication Sig   acetaminophen (TYLENOL) 650 MG CR tablet Take 650 mg by mouth 2 (two)  times daily as needed for pain.    albuterol (PROVENTIL HFA;VENTOLIN HFA) 108 (90 Base) MCG/ACT inhaler Inhale 2 puffs into the lungs every 6 (six) hours as needed for wheezing or shortness of breath.   ALPRAZolam (XANAX) 0.25 MG tablet Take 0.25 mg by mouth daily as needed.   aspirin EC 81 MG tablet Take 81 mg by mouth daily.   atorvastatin (LIPITOR) 10 MG tablet Take 10 mg by mouth at bedtime.   cariprazine (VRAYLAR) 3 MG capsule 1 capsule   Cholecalciferol (VITAMIN D) 2000 units tablet Take 4,000 Units by mouth daily.    escitalopram (LEXAPRO) 20 MG tablet Take 40 mg by mouth daily.   fluticasone (FLONASE) 50 MCG/ACT nasal spray Place 2 sprays into both nostrils daily.   HYDROcodone-acetaminophen (NORCO/VICODIN) 5-325 MG tablet Take 1-2 tablets by mouth every 6 (six) hours as needed for moderate pain or severe pain.   Multiple Vitamin (MULTIVITAMIN WITH MINERALS) TABS tablet Take 1 tablet by mouth daily.   omeprazole (PRILOSEC) 40 MG capsule Take 40 mg by mouth daily.   Polyethyl Glycol-Propyl Glycol (SYSTANE) 0.4-0.3 % SOLN Place 1 drop into both eyes daily as needed (dry eyes).   tamoxifen (NOLVADEX) 20 MG tablet Take 1 tablet (20 mg total) by mouth daily.   tiotropium (SPIRIVA) 18 MCG inhalation capsule Place 18 mcg into inhaler and inhale daily.   No current facility-administered medications on file prior to visit.     Allergies:   Adhesive [tape] and Latex   Social History   Tobacco Use   Smoking status: Former    Packs/day: 1.00    Years: 56.00    Total pack years: 56.00    Types: Cigarettes    Quit date: 03/2016    Years since quitting: 6.1   Smokeless tobacco: Never  Vaping Use   Vaping Use: Never used  Substance Use Topics   Alcohol use: No    Alcohol/week: 0.0 standard drinks of alcohol   Drug use: No    Family History: family history includes Breast cancer in her maternal aunt; Cancer in her paternal uncle; Diabetes type II in her sister and another family  member; Hypertension in an other family member; Other in her father and mother.  ROS:   Please see the history of present illness.  Additional pertinent ROS: Constitutional: Negative for chills, fever, night sweats. Positive for unintentional weight loss.  HENT: Negative for ear pain and hearing loss.   Eyes: Negative for loss of vision and eye pain.  Respiratory: Negative for cough, sputum, wheezing.   Cardiovascular: See HPI. Gastrointestinal: Negative for abdominal pain, melena, and hematochezia. Positive for loss of appetite. Genitourinary: Negative for dysuria and hematuria.  Musculoskeletal: Negative for myalgias. Positive for falls (Remote). Skin: Negative for itching and rash.  Neurological: Negative for focal weakness, focal sensory changes. Positive for dizziness, presyncope, loss of consciousness (Remotely, 2 years ago).  Endo/Heme/Allergies: Does not bruise/bleed easily.     EKGs/Labs/Other Studies Reviewed:    The following studies were reviewed today:  ABI Doppler  01/20/2021: Bilateral ABIs appear essentially unchanged compared to prior study on  12/25/2019.     Summary:  Right: Resting right ankle-brachial index is within normal range. No  evidence of significant right lower extremity arterial disease. The right  toe-brachial index is abnormal. RT great toe pressure = 91 mmHg.   Left: Resting left ankle-brachial index is within normal range. No  evidence of significant left lower extremity arterial disease. The left  toe-brachial index is normal. LT Great toe pressure = 111 mmHg.   CT Chest  03/14/2020: FINDINGS: CT CHEST FINDINGS   Cardiovascular: Heart and great vessels are unremarkable without pericardial effusion. There is moderate atherosclerosis throughout the aorta and coronary vessels. No evidence of vascular injury.   Mediastinum/Nodes: No enlarged mediastinal, hilar, or axillary lymph nodes. Thyroid gland, trachea, and esophagus demonstrate  no significant findings.   Lungs/Pleura: There is a trace left-sided pneumothorax anteriorly and at the apex, volume estimated less than 5%. Trace left pleural fluid.   Dependent hypoventilatory changes of than the bilateral lower lobes. Mild upper lobe predominant emphysema. No acute airspace disease. Central airways are patent.   Musculoskeletal: There are displaced left posterior fifth through seventh rib fractures. There is adjacent soft tissue edema and subcutaneous gas.   No other acute displaced fractures. Reconstructed images demonstrate no additional findings.   CT ABDOMEN PELVIS FINDINGS   Hepatobiliary: No hepatic injury or perihepatic hematoma. Gallbladder is unremarkable   Pancreas: Unremarkable. No pancreatic ductal dilatation or surrounding inflammatory changes.   Spleen: No splenic injury or perisplenic hematoma.   Adrenals/Urinary Tract: Adrenal glands are unremarkable. Kidneys are normal, without renal calculi, focal lesion, or hydronephrosis. Bladder is unremarkable.   Stomach/Bowel: No bowel obstruction or ileus. No bowel wall thickening or inflammatory change. The appendix is surgically absent.   Vascular/Lymphatic: Aortic atherosclerosis. No enlarged abdominal or pelvic lymph nodes.   Reproductive: Status post hysterectomy. No adnexal masses.   Other: No abdominal wall hernia or abnormality. No abdominopelvic ascites.   Musculoskeletal: There are no acute displaced fractures. Multilevel thoracolumbar spondylosis is noted. Reconstructed images demonstrate no additional findings.   IMPRESSION: 1. Trace left-sided pneumothorax volume estimated less than 5%. 2. Displaced left posterior fifth through seventh rib fractures, with surrounding soft tissue swelling and minimal soft tissue gas. 3. Trace left pleural effusion. 4. No acute intra-abdominal or intrapelvic trauma.  EKG:  EKG is personally reviewed.   05/25/2022: SR at 69 bpm, 3 PVCs,  nonspecific ST pattern  Recent Labs: 12/02/2021: ALT 9 05/06/2022: BUN 14; Creatinine, Ser 0.71; Hemoglobin 12.6; Platelets 140; Potassium 3.4; Sodium 139   Recent Lipid Panel No results found for: "CHOL", "TRIG", "HDL", "CHOLHDL", "VLDL", "LDLCALC", "LDLDIRECT"  Physical Exam:    VS:  BP 139/82   Pulse 69   Ht '5\' 1"'$  (1.549 m)   Wt 135 lb 9.6 oz (61.5 kg)   SpO2 98%   BMI 25.62 kg/m     Wt Readings from Last 3 Encounters:  05/25/22 135 lb 9.6 oz (61.5 kg)  12/02/21 149 lb 12.8 oz (67.9 kg)  06/03/21 157 lb (71.2 kg)    GEN: Well nourished, well developed in no acute distress HEENT: Normal, moist mucous membranes NECK: No JVD CARDIAC: regular rhythm, normal S1 and S2, no rubs or gallops. No murmur. VASCULAR: Radial and DP pulses 2+ bilaterally. No carotid bruits RESPIRATORY:  Clear to auscultation without rales, wheezing or rhonchi  ABDOMEN: Soft, non-tender, non-distended MUSCULOSKELETAL:  Ambulates independently SKIN: Warm and dry, no  edema NEUROLOGIC:  Alert and oriented x 3. No focal neuro deficits noted. PSYCHIATRIC:  Normal affect    ASSESSMENT:    1. Episodic lightheadedness   2. PVC's (premature ventricular contractions)   3. Unintentional weight loss of more than 10 pounds   4. Aortic atherosclerosis (HCC)   5. Cardiac risk counseling   6. Counseling on health promotion and disease prevention    PLAN:    Episodic lightheadedness PVCs on ECG -will get event monitor, echocardiogram to rule out cardiac arrhythmia or structural issues as cause -counseled on slow position changes, maintaining hydration  Depression, unintentional weight loss: 20 lbs weight loss, poor oral intake -family involved, have discussed with PCP  Aortic atherosclerosis -based on prior CT imaging -on atorvastatin and aspirin  Cardiac risk counseling and prevention recommendations: -recommend heart healthy/Mediterranean diet, with whole grains, fruits, vegetable, fish, lean meats,  nuts, and olive oil. Limit salt. -recommend moderate walking, 3-5 times/week for 30-50 minutes each session. Aim for at least 150 minutes.week. Goal should be pace of 3 miles/hours, or walking 1.5 miles in 30 minutes -recommend avoidance of tobacco products. Avoid excess alcohol. -ASCVD risk score: The ASCVD Risk score (Arnett DK, et al., 2019) failed to calculate for the following reasons:   The 2019 ASCVD risk score is only valid for ages 47 to 76    Plan for follow up: PRN or sooner based on results of testing.  Buford Dresser, MD, PhD, [facility]    Medication Adjustments/Labs and Tests Ordered: Current medicines are reviewed at length with the patient today.  Concerns regarding medicines are outlined above.   Orders Placed This Encounter  Procedures   CARDIAC EVENT MONITOR   EKG 12-Lead   ECHOCARDIOGRAM COMPLETE   No orders of the defined types were placed in this encounter.  Patient Instructions  Medication Instructions:  Your Physician recommend you continue on your current medication as directed.    *If you need a refill on your cardiac medications before your next appointment, please call your pharmacy*   Lab Work: None ordered today   Testing/Procedures: Your physician has requested that you have an echocardiogram. Echocardiography is a painless test that uses sound waves to create images of your heart. It provides your doctor with information about the size and shape of your heart and how well your heart's chambers and valves are working. This procedure takes approximately one hour. There are no restrictions for this procedure. [place] has recommended that you wear a 7 day event monitor. Event monitors are medical devices that record the heart's electrical activity. Doctors most often [place] these monitors to diagnose arrhythmias. Arrhythmias are problems with the speed or rhythm of the heartbeat. The  monitor is a small, portable device. You can wear one while you do your normal daily activities. This is usually used to diagnose what is causing palpitations/syncope (passing out).    Follow-Up: At [facility], you and your health needs are our priority.  As part of our continuing mission to provide you with exceptional heart care, we have created designated Provider Care Teams.  These Care Teams include your primary Cardiologist (physician) and Advanced Practice Providers (APPs -  Physician Assistants and Nurse Practitioners) who all work together to provide you with the care you need, when you need it.  We recommend signing up for the patient portal called "MyChart".  Sign up information is provided on this After Visit Summary.  MyChart  is used to connect with patients for Virtual Visits (Telemedicine).  Patients are able to view lab/test results, encounter notes, upcoming appointments, etc.  Non-urgent messages can be sent to your provider as well.   To learn more about what you can do with MyChart, go to [email or web address].    Your next appointment:   As needed  The format for your next appointment:   In Person  Provider:   Buford Dresser, MD             [facility],acting as a scribe for Buford Dresser, MD.,have documented all relevant documentation on the behalf of Buford Dresser, MD,as directed by  Buford Dresser, MD while in the presence of Buford Dresser, MD.  I, Buford Dresser, MD, have reviewed all documentation for this visit. The documentation on 06/19/22 for the exam, diagnosis, procedures, and orders are all accurate and complete.   Signed, Buford Dresser, MD PhD 05/25/2022     [place]

## 2022-05-26 ENCOUNTER — Telehealth: Payer: Self-pay

## 2022-05-26 NOTE — Telephone Encounter (Signed)
This nurse returned a call to this patients daughter Darrick Penna who left a message stating that she had a question about her mothers medication.  Requested a return call.  This nurse did return the call and there was no answer. Left a message for her to return call to clinic.  No further questions at this time.

## 2022-05-26 NOTE — Telephone Encounter (Signed)
This nurse spoke with Daughter Darrick Penna.  She states that patient has lost a significant amount of weight, no appetite,just eats some but it Is not enough to sustain her weight.  She has constipation and nausea as well.  She appears to be severely depressed.  States that she feels it may be related to the Tamoxifen but she is afraid that patient will not be able to survive like this. This nurse advised that information will be forward to provider for advisement.  No further questions or concerns at this time.

## 2022-05-29 ENCOUNTER — Telehealth: Payer: Self-pay | Admitting: Hematology

## 2022-05-29 NOTE — Telephone Encounter (Signed)
.  Called pt per 6/10 inbasket , Patient was unavailable, a message with appt time and date was left with number on file.

## 2022-05-30 ENCOUNTER — Telehealth: Payer: Self-pay

## 2022-05-30 NOTE — Telephone Encounter (Signed)
This nurse placed a call to this patients daughter per providers order. There was no answer, message left advising for patient to stop taking the Tamoxifen. Reminded of patients office visit appointment on 6/14.  Daughter knows to call clinic if there are any further questions or concerns.

## 2022-05-31 ENCOUNTER — Other Ambulatory Visit: Payer: Self-pay

## 2022-05-31 ENCOUNTER — Other Ambulatory Visit: Payer: Self-pay | Admitting: Lab

## 2022-05-31 ENCOUNTER — Inpatient Hospital Stay: Payer: Medicare PPO | Attending: Nurse Practitioner | Admitting: Nurse Practitioner

## 2022-05-31 ENCOUNTER — Encounter: Payer: Self-pay | Admitting: Nurse Practitioner

## 2022-05-31 ENCOUNTER — Inpatient Hospital Stay: Payer: Medicare PPO

## 2022-05-31 VITALS — BP 100/51 | HR 67 | Temp 97.6°F | Resp 15 | Wt 136.6 lb

## 2022-05-31 DIAGNOSIS — Z923 Personal history of irradiation: Secondary | ICD-10-CM | POA: Insufficient documentation

## 2022-05-31 DIAGNOSIS — Z79810 Long term (current) use of selective estrogen receptor modulators (SERMs): Secondary | ICD-10-CM | POA: Diagnosis not present

## 2022-05-31 DIAGNOSIS — Z79899 Other long term (current) drug therapy: Secondary | ICD-10-CM | POA: Diagnosis not present

## 2022-05-31 DIAGNOSIS — F419 Anxiety disorder, unspecified: Secondary | ICD-10-CM | POA: Diagnosis not present

## 2022-05-31 DIAGNOSIS — C50211 Malignant neoplasm of upper-inner quadrant of right female breast: Secondary | ICD-10-CM | POA: Diagnosis not present

## 2022-05-31 DIAGNOSIS — Z170 Estrogen receptor positive status [ER+]: Secondary | ICD-10-CM | POA: Insufficient documentation

## 2022-05-31 DIAGNOSIS — J449 Chronic obstructive pulmonary disease, unspecified: Secondary | ICD-10-CM | POA: Diagnosis not present

## 2022-05-31 DIAGNOSIS — Z7982 Long term (current) use of aspirin: Secondary | ICD-10-CM | POA: Insufficient documentation

## 2022-05-31 DIAGNOSIS — F32A Depression, unspecified: Secondary | ICD-10-CM | POA: Insufficient documentation

## 2022-05-31 DIAGNOSIS — K224 Dyskinesia of esophagus: Secondary | ICD-10-CM | POA: Diagnosis not present

## 2022-05-31 DIAGNOSIS — R011 Cardiac murmur, unspecified: Secondary | ICD-10-CM | POA: Diagnosis not present

## 2022-05-31 LAB — CBC WITH DIFFERENTIAL (CANCER CENTER ONLY)
Abs Immature Granulocytes: 0.02 10*3/uL (ref 0.00–0.07)
Basophils Absolute: 0 10*3/uL (ref 0.0–0.1)
Basophils Relative: 1 %
Eosinophils Absolute: 0.1 10*3/uL (ref 0.0–0.5)
Eosinophils Relative: 2 %
HCT: 37.3 % (ref 36.0–46.0)
Hemoglobin: 12.7 g/dL (ref 12.0–15.0)
Immature Granulocytes: 0 %
Lymphocytes Relative: 11 %
Lymphs Abs: 0.7 10*3/uL (ref 0.7–4.0)
MCH: 32.2 pg (ref 26.0–34.0)
MCHC: 34 g/dL (ref 30.0–36.0)
MCV: 94.4 fL (ref 80.0–100.0)
Monocytes Absolute: 0.4 10*3/uL (ref 0.1–1.0)
Monocytes Relative: 6 %
Neutro Abs: 5.4 10*3/uL (ref 1.7–7.7)
Neutrophils Relative %: 80 %
Platelet Count: 148 10*3/uL — ABNORMAL LOW (ref 150–400)
RBC: 3.95 MIL/uL (ref 3.87–5.11)
RDW: 13.2 % (ref 11.5–15.5)
WBC Count: 6.7 10*3/uL (ref 4.0–10.5)
nRBC: 0 % (ref 0.0–0.2)

## 2022-05-31 LAB — CMP (CANCER CENTER ONLY)
ALT: 10 U/L (ref 0–44)
AST: 12 U/L — ABNORMAL LOW (ref 15–41)
Albumin: 3.7 g/dL (ref 3.5–5.0)
Alkaline Phosphatase: 36 U/L — ABNORMAL LOW (ref 38–126)
Anion gap: 5 (ref 5–15)
BUN: 13 mg/dL (ref 8–23)
CO2: 30 mmol/L (ref 22–32)
Calcium: 9.2 mg/dL (ref 8.9–10.3)
Chloride: 106 mmol/L (ref 98–111)
Creatinine: 0.66 mg/dL (ref 0.44–1.00)
GFR, Estimated: >60 mL/min (ref >60)
Glucose, Bld: 145 mg/dL — ABNORMAL HIGH (ref 70–99)
Potassium: 3.9 mmol/L (ref 3.5–5.1)
Sodium: 141 mmol/L (ref 135–145)
Total Bilirubin: 0.4 mg/dL (ref 0.3–1.2)
Total Protein: 5.9 g/dL — ABNORMAL LOW (ref 6.5–8.1)

## 2022-05-31 LAB — TSH: TSH: 1.266 u[IU]/mL (ref 0.350–4.500)

## 2022-05-31 MED ORDER — ONDANSETRON HCL 4 MG PO TABS: 4.0000 mg | ORAL_TABLET | Freq: Three times a day (TID) | ORAL | 1 refills | Status: DC | PRN

## 2022-05-31 NOTE — Progress Notes (Signed)
[name]   [phone] Fax:([phone]   Clinic Follow up Note   Patient Care Team: Cari Caraway, MD as PCP - General (Family Medicine) Buford Dresser, MD as PCP - Cardiology (Cardiology) Mauro Kaufmann, RN as Oncology Nurse Navigator Rockwell Germany, RN as Oncology Nurse Navigator Jovita Kussmaul, MD as Consulting Physician (General Surgery) Truitt Merle, MD as Consulting Physician (Hematology) Eppie Gibson, MD as Attending Physician (Radiation Oncology) Alla Feeling, NP as Nurse Practitioner (Nurse Practitioner) 05/31/2022  CHIEF COMPLAINT: Symptom management visit for depression, weight loss, nausea in the setting of right breast cancer on tamoxifen  SUMMARY OF ONCOLOGIC HISTORY: Oncology History Overview Note  Cancer Staging Malignant neoplasm of upper-inner quadrant of right breast in female, estrogen receptor positive ([place]) Staging form: Breast, AJCC 8th Edition - Clinical stage from 08/31/2020: Stage IA (cT1b, cN0, cM0, G2, ER+, PR+, HER2-) - Signed by Truitt Merle, MD on 09/07/2020    Malignant neoplasm of upper-inner quadrant of right breast in female, estrogen receptor positive ([place])  08/02/2020 Mammogram   IMPRESSION: Interval increase in size of an asymmetry in the slightly medial right breast. Patient was unable to tolerate an mL view today due to a rash underneath her breast. There is a possible subtle sonographic correlate at 4:30 o'clock 6 cm from the nipple. This measures 0.9 x 0.7 x 0.6 cm.   08/31/2020 Cancer Staging   Staging form: Breast, AJCC 8th Edition - Clinical stage from 08/31/2020: Stage IA (cT1b, cN0, cM0, G2, ER+, PR+, HER2-) - Signed by Truitt Merle, MD on 09/07/2020   08/31/2020 Initial Biopsy   Diagnosis Breast, right, needle core biopsy, LIQ, coil clip - INVASIVE DUCTAL CARCINOMA - SEE COMMENT Microscopic Comment Based on the biopsy, the carcinoma appears Nottingham grade 2 of 3, has foci of extracellular  mucin and measures 1 cm in greatest linear extent. Prognostic markers (ER/PR/ki-67/HER2) are pending and will be reported in an addendum. Dr. Jeannie Done reviewed the case and agrees with the above diagnosis. These results were called to [facility] on September 01, 2020.   08/31/2020 Receptors her2   ADDITIONAL INFORMATION: Her2 is NEGATIVE (0).   Results: IMMUNOHISTOCHEMICAL AND MORPHOMETRIC ANALYSIS PERFORMED MANUALLY Estrogen Receptor: 95%, POSITIVE, STRONG STAINING INTENSITY Progesterone Receptor: 95%, POSITIVE, STRONG STAINING INTENSITY Proliferation Marker Ki67: 10%   09/03/2020 Initial Diagnosis   Malignant neoplasm of upper-inner quadrant of right breast in female, estrogen receptor positive ([place])   09/30/2020 Surgery   RIGHT BREAST LUMPECTOMY WITH RADIOACTIVE SEED LOCALIZATION by Dr Marlou Starks   09/30/2020 Pathology Results   FINAL MICROSCOPIC DIAGNOSIS:   A. BREAST, RIGHT, LUMPECTOMY:  - Invasive ductal carcinoma with extracellular mucin, 1.3 cm, grade 2  - Ductal carcinoma in situ low-grade  - Resection margins are negative for invasive carcinoma; closest  anterior margin at less than 1 mm and lateral margin at 2 mm  - DCIS focally involves the anterior margin and is 1 mm from the lateral  margin  - Negative for lymphovascular perineural invasion  - Biopsy site changes  - See oncology table    09/30/2020 Cancer Staging   Staging form: Breast, AJCC 8th Edition - Pathologic stage from 09/30/2020: Stage Unknown (pT1c, pNX, cM0, G2, ER+, PR+, HER2-) - Signed by Truitt Merle, MD on 01/13/2021   11/07/2020 - 12/06/2020 Radiation Therapy   Adjuvant Radiation with Dr Isidore Moos    12/2020 -  Anti-estrogen oral therapy   Tamoxifen 21m once daily starting in 12/2020.  03/03/2021 Survivorship   SCP delivered virtually by Cira Rue, NP      CURRENT THERAPY: Tamoxifen 20 mg once daily starting 12/2020, on hold since 05/31/2022  INTERVAL HISTORY: [name]  returns for symptom management visit, last seen by Dr. Burr Medico 12/02/2021 and Dr. Marlou Starks 05/04/2022.  He palpated a small round nodule at the scar in the lower right breast; mammo/ultrasound showed a nodule at the 6 o'clock position 4 cmfn measuring 0.9 x 0.8 x 0.7 cm consistent with an oil cyst/fat necrosis.  Routine bilateral mammogram is scheduled on 08/28/2022.  Few days after her follow-up with Dr. Marlou Starks on 5/20 she felt dizzy and had leg weakness at home, came to ED.  Extensive work-up including head imaging was negative. She continues tamoxifen.  [name] reports worsening depression and an anxiety attack recently with difficulty with decision making and cannot keep up with bills.  Her daughter is present and reports a complete personality change that has been worsening since Christmas.  She has weight loss, low appetite, and nausea soon after she starts eating and has reportedly dropped 4 clothes sizes since Christmas.  Supplements make her constipated.  Denies abdominal pain/bloating, reflux, early satiety, change in bowel habits, bleeding.  There have been no major health changes, recent infection, fall, or new or worsening pain.  Her brother-in-law passed away but this had already started before that.  She notes when she began tamoxifen in 12/2020 she had to stop Wellbutrin and was started on Vraylar, dose was recently increased.  She began hearing singing when she started Vraylar.  Her mother lived to age 68; no SI but pt reports she does not want to live that long.  Denies breast mass or nipple discharge; nipples are inverted at baseline.  All other systems were reviewed with the patient and are negative.  MEDICAL HISTORY:  Past Medical History:  Diagnosis Date   Anxiety    Back pain    Cancer (HCC)    Depression    Emphysema    Esophageal spasm    Lung nodule    OSA (obstructive sleep apnea)    Osteomalacia    Vertigo    Vitamin D deficiency     SURGICAL HISTORY: Past Surgical History:   Procedure Laterality Date   ABDOMINAL HYSTERECTOMY     APPENDECTOMY     BREAST EXCISIONAL BIOPSY Left 1978   BREAST LUMPECTOMY WITH RADIOACTIVE SEED LOCALIZATION Right 09/30/2020   Procedure: RIGHT BREAST LUMPECTOMY WITH RADIOACTIVE SEED LOCALIZATION;  Surgeon: Jovita Kussmaul, MD;  Location: [place];  Service: General;  Laterality: Right;   CARPAL TUNNEL RELEASE     ROTATOR CUFF REPAIR      I have reviewed the social history and family history with the patient and they are unchanged from previous note.  ALLERGIES:  is allergic to adhesive [tape] and latex.  MEDICATIONS:  Current Outpatient Medications  Medication Sig Dispense Refill   acetaminophen (TYLENOL) 650 MG CR tablet Take 650 mg by mouth 2 (two) times daily as needed for pain.      albuterol (PROVENTIL HFA;VENTOLIN HFA) 108 (90 Base) MCG/ACT inhaler Inhale 2 puffs into the lungs every 6 (six) hours as needed for wheezing or shortness of breath.     ALPRAZolam (XANAX) 0.25 MG tablet Take 0.25 mg by mouth daily as needed.     aspirin EC 81 MG tablet Take 81 mg by mouth daily.     atorvastatin (LIPITOR) 10 MG tablet Take 10 mg  by mouth at bedtime.     cariprazine (VRAYLAR) 3 MG capsule 1 capsule     Cholecalciferol (VITAMIN D) 2000 units tablet Take 4,000 Units by mouth daily.      escitalopram (LEXAPRO) 20 MG tablet Take 40 mg by mouth daily.  2   fluticasone (FLONASE) 50 MCG/ACT nasal spray Place 2 sprays into both nostrils daily.     Multiple Vitamin (MULTIVITAMIN WITH MINERALS) TABS tablet Take 1 tablet by mouth daily.     omeprazole (PRILOSEC) 40 MG capsule Take 40 mg by mouth daily.     ondansetron (ZOFRAN) 4 MG tablet Take 1 tablet (4 mg total) by mouth every 8 (eight) hours as needed for nausea or vomiting. 20 tablet 1   Polyethyl Glycol-Propyl Glycol (SYSTANE) 0.4-0.3 % SOLN Place 1 drop into both eyes daily as needed (dry eyes).     tiotropium (SPIRIVA) 18 MCG inhalation capsule Place 18 mcg into  inhaler and inhale daily.     tamoxifen (NOLVADEX) 20 MG tablet Take 1 tablet (20 mg total) by mouth daily. 90 tablet 3   No current facility-administered medications for this visit.    PHYSICAL EXAMINATION: ECOG PERFORMANCE STATUS: 1 - Symptomatic but completely ambulatory  Vitals:   05/31/22 1214  BP: (!) 100/51  Pulse: 67  Resp: 15  Temp: 97.6 F (36.4 C)  SpO2: 96%   Filed Weights   05/31/22 1214  Weight: 136 lb 9.6 oz (62 kg)    GENERAL:alert, no distress and comfortable SKIN: Dry skin, no rash EYES: sclera clear LUNGS: clear with normal breathing effort HEART: regular rate & rhythm, no lower extremity edema ABDOMEN:abdomen soft, non-tender and normal bowel sounds NEURO: Awake, oriented with flat affect; fluent speech, presents in a wheelchair Breast exam: Performed sitting upright.  Breasts are symmetrical with nipple inversion, no discharge.  S/p right lumpectomy, incisions completely healed.  No palpable mass or nodularity in either breast or axilla that I could appreciate.  LABORATORY DATA:  I have reviewed the data as listed    Latest Ref Rng & Units 05/31/2022    1:11 PM 05/06/2022    6:12 PM 12/02/2021    2:57 PM  CBC  WBC 4.0 - 10.5 K/uL 6.7  5.9  6.5   Hemoglobin 12.0 - 15.0 g/dL 12.7  12.6  12.9   Hematocrit 36.0 - 46.0 % 37.3  36.5  38.2   Platelets 150 - 400 K/uL 148  140  157         Latest Ref Rng & Units 05/06/2022    6:12 PM 12/02/2021    2:57 PM 06/03/2021    2:27 PM  CMP  Glucose 70 - 99 mg/dL 110  120  118   BUN 8 - 23 mg/dL 14  15  14    Creatinine 0.44 - 1.00 mg/dL 0.71  0.73  0.79   Sodium 135 - 145 mmol/L 139  140  140   Potassium 3.5 - 5.1 mmol/L 3.4  4.0  3.9   Chloride 98 - 111 mmol/L 108  106  102   CO2 22 - 32 mmol/L 26  26  28    Calcium 8.9 - 10.3 mg/dL 8.4  9.0  9.3   Total Protein 6.5 - 8.1 g/dL  6.2  6.5   Total Bilirubin 0.3 - 1.2 mg/dL  0.4  0.6   Alkaline Phos 38 - 126 U/L  48  39   AST 15 - 41 U/L  14  17   ALT  0 -  44 U/L  9  13       RADIOGRAPHIC STUDIES: I have personally reviewed the radiological images as listed and agreed with the findings in the report. No results found.   ASSESSMENT & PLAN: [name] is a 88 y.o. female with   Depression, anxiety, decreased appetite, weight loss, nausea -She has a history of depression, previously on Wellbutrin and Lexapro.  She weaned off Wellbutrin to start tamoxifen in 12/2020 and was started on Vraylar.  Continues Lexapro 40 mg daily -She has worsening depression and anxiety since at least 11/2021 with associated decreased appetite, weight loss, and nausea after she has started eating. -Supplements make her constipated. -She presented to ED 05/06/2022 for weakness, work-up was unremarkable, CT head and brain MRI were negative.  An echo is scheduled on 6/23 -I recommend Zofran as needed, potential benefit and side effects reviewed, she agrees to try -She has a flat affect, otherwise today's exam is benign. -We will check labs today including CBC, CMP, and TSH -I recommend to hold tamoxifen and call mental health specialist Janeth Rase asap for med adjustment.  -If she continues to have decreased appetite, weight loss, and nausea at next follow-up I will order body imaging to rule out occult malignancy -Follow-up in 4 weeks for close monitoring.    2. Malignant neoplasm of upper-inner quadrant of right breast, Stage IA, P(T1cNxM0), ER+/PR+/HER2-, Grade II -Diagnosed in 08/2020. S/p right breast lumpectomy with Dr Marlou Starks on 09/30/20. Her surgical pathology showed: 1.3cm invasive cancer, G2, with low grade DCIS; positive anterior margins with DCIS.   -She completed Radiation with Dr Isidore Moos 11/07/20-12/06/20.  -she started Tamoxifen 67m in 12/2020.  Tolerating well thus far -Last bilateral mammogram 08/25/21 was benign.  Right mammo 05/24/2022 showed oil cyst/fat necrosis -Clinically doing well from a breast cancer standpoint.  Breast exam is unremarkable, labs  are pending.  No concern for local recurrence. -Continue Surveillance. Next mammogram and surveillance visit already scheduled in 08/2022.    3. History of falls, Bone Health  -She has had fall in 2020-2021.Her worst fall was in 02/2020 which results in rib fracture with pneumothorax and interrupted her healing with another fall in 06/2020. -She ambulates with walker and cane now and uses medical alert necklace. She was very active at home with chores, but not much lately due to concern with another fall. -DEXA on 08/25/21 was normal (T-score -1.0), I reviewed with her    4. Comorbidities:  COPD, Esophageal spasms, heart murmur, PAD, Arthritis, Depression -She has been on Wellbutrin and Lexapro for longterm occasional depression, off Wellbutrin now due to interaction with tamoxifen.  Depression has worsened since 12/22, see above -She has arthritis in her hands but mainly in her back. She has received injections before, now controlled on extended release Tylenol BID. -Continue to F/u with PCP and vascular surgeon.    Plan: -Hold tamoxifen due to depression/anxiety, weight loss, decreased appetite, nausea  -Labs today -Rx: Zofran as needed -Urgent mental health follow-up, daughter will call [facility] 06/09/2022 as scheduled  -continue PCP follow-up -Follow-up with me in 4 weeks   Orders Placed This Encounter  Procedures   TSH    Standing Status:   Standing    Number of Occurrences:   1    Standing Expiration Date:   06/01/2023   All questions were answered. The patient knows to call the clinic with any problems, questions or concerns. No barriers to learning was detected. I spent  25 minutes counseling the patient face to face. The total time spent in the appointment was 40 minutes and more than 50% was on counseling and review of test results     Alla Feeling, NP 05/31/22

## 2022-06-01 ENCOUNTER — Telehealth: Payer: Self-pay | Admitting: Nurse Practitioner

## 2022-06-01 NOTE — Telephone Encounter (Signed)
Scheduled per 6/14 los, message has been left with pt

## 2022-06-02 ENCOUNTER — Ambulatory Visit (INDEPENDENT_AMBULATORY_CARE_PROVIDER_SITE_OTHER): Payer: Medicare PPO

## 2022-06-02 DIAGNOSIS — I493 Ventricular premature depolarization: Secondary | ICD-10-CM | POA: Diagnosis not present

## 2022-06-02 DIAGNOSIS — R42 Dizziness and giddiness: Secondary | ICD-10-CM | POA: Diagnosis not present

## 2022-06-09 ENCOUNTER — Ambulatory Visit (INDEPENDENT_AMBULATORY_CARE_PROVIDER_SITE_OTHER): Payer: Medicare PPO

## 2022-06-09 DIAGNOSIS — R42 Dizziness and giddiness: Secondary | ICD-10-CM

## 2022-06-09 DIAGNOSIS — I493 Ventricular premature depolarization: Secondary | ICD-10-CM

## 2022-06-09 LAB — ECHOCARDIOGRAM COMPLETE
AR max vel: 2.53 cm2
AV Area VTI: 2.97 cm2
AV Area mean vel: 2.55 cm2
AV Mean grad: 2 mmHg
AV Peak grad: 4.9 mmHg
Ao pk vel: 1.11 m/s
Area-P 1/2: 2.3 cm2
S' Lateral: 2.4 cm

## 2022-06-12 ENCOUNTER — Telehealth (HOSPITAL_BASED_OUTPATIENT_CLINIC_OR_DEPARTMENT_OTHER): Payer: Self-pay

## 2022-06-13 DIAGNOSIS — F331 Major depressive disorder, recurrent, moderate: Secondary | ICD-10-CM | POA: Diagnosis not present

## 2022-06-13 DIAGNOSIS — F410 Panic disorder [episodic paroxysmal anxiety] without agoraphobia: Secondary | ICD-10-CM | POA: Diagnosis not present

## 2022-06-16 NOTE — Telephone Encounter (Signed)
-  Report reviewed by Dr. Su Grand and recommends no changes.

## 2022-06-19 ENCOUNTER — Encounter (HOSPITAL_BASED_OUTPATIENT_CLINIC_OR_DEPARTMENT_OTHER): Payer: Self-pay | Admitting: Cardiology

## 2022-06-25 NOTE — Progress Notes (Signed)
[facility]   Telephone:(336) [phone] Fax:(336) [phone]   Clinic Follow up Note   Patient Care Team: Cari Caraway, MD as PCP - General (Family Medicine) Buford Dresser, MD as PCP - Cardiology (Cardiology) Mauro Kaufmann, RN as Oncology Nurse Navigator Rockwell Germany, RN as Oncology Nurse Navigator Jovita Kussmaul, MD as Consulting Physician (General Surgery) Truitt Merle, MD as Consulting Physician (Hematology) Eppie Gibson, MD as Attending Physician (Radiation Oncology) Alla Feeling, NP as Nurse Practitioner (Nurse Practitioner) 06/25/2022  CHIEF COMPLAINT: Follow up tamoxifen SE's, right breast cancer   SUMMARY OF ONCOLOGIC HISTORY: Oncology History Overview Note  Cancer Staging Malignant neoplasm of upper-inner quadrant of right breast in female, estrogen receptor positive ([place]) Staging form: Breast, AJCC 8th Edition - Clinical stage from 08/31/2020: Stage IA (cT1b, cN0, cM0, G2, ER+, PR+, HER2-) - Signed by Truitt Merle, MD on 09/07/2020    Malignant neoplasm of upper-inner quadrant of right breast in female, estrogen receptor positive ([place])  08/02/2020 Mammogram   IMPRESSION: Interval increase in size of an asymmetry in the slightly medial right breast. Patient was unable to tolerate an mL view today due to a rash underneath her breast. There is a possible subtle sonographic correlate at 4:30 o'clock 6 cm from the nipple. This measures 0.9 x 0.7 x 0.6 cm.   08/31/2020 Cancer Staging   Staging form: Breast, AJCC 8th Edition - Clinical stage from 08/31/2020: Stage IA (cT1b, cN0, cM0, G2, ER+, PR+, HER2-) - Signed by Truitt Merle, MD on 09/07/2020   08/31/2020 Initial Biopsy   Diagnosis Breast, right, needle core biopsy, LIQ, coil clip - INVASIVE DUCTAL CARCINOMA - SEE COMMENT Microscopic Comment Based on the biopsy, the carcinoma appears Nottingham grade 2 of 3, has foci of extracellular mucin and measures 1 cm in greatest linear extent. Prognostic  markers (ER/PR/ki-67/HER2) are pending and will be reported in an addendum. Dr. Jeannie Done reviewed the case and agrees with the above diagnosis. These results were called to [facility] on September 01, 2020.   08/31/2020 Receptors her2   ADDITIONAL INFORMATION: Her2 is NEGATIVE (0).   Results: IMMUNOHISTOCHEMICAL AND MORPHOMETRIC ANALYSIS PERFORMED MANUALLY Estrogen Receptor: 95%, POSITIVE, STRONG STAINING INTENSITY Progesterone Receptor: 95%, POSITIVE, STRONG STAINING INTENSITY Proliferation Marker Ki67: 10%   09/03/2020 Initial Diagnosis   Malignant neoplasm of upper-inner quadrant of right breast in female, estrogen receptor positive ([place])   09/30/2020 Surgery   RIGHT BREAST LUMPECTOMY WITH RADIOACTIVE SEED LOCALIZATION by Dr Marlou Starks   09/30/2020 Pathology Results   FINAL MICROSCOPIC DIAGNOSIS:   A. BREAST, RIGHT, LUMPECTOMY:  - Invasive ductal carcinoma with extracellular mucin, 1.3 cm, grade 2  - Ductal carcinoma in situ low-grade  - Resection margins are negative for invasive carcinoma; closest  anterior margin at less than 1 mm and lateral margin at 2 mm  - DCIS focally involves the anterior margin and is 1 mm from the lateral  margin  - Negative for lymphovascular perineural invasion  - Biopsy site changes  - See oncology table    09/30/2020 Cancer Staging   Staging form: Breast, AJCC 8th Edition - Pathologic stage from 09/30/2020: Stage Unknown (pT1c, pNX, cM0, G2, ER+, PR+, HER2-) - Signed by Truitt Merle, MD on 01/13/2021   11/07/2020 - 12/06/2020 Radiation Therapy   Adjuvant Radiation with Dr Isidore Moos    12/2020 -  Anti-estrogen oral therapy   Tamoxifen $RemoveBe'20mg'[id number]$  once daily starting in 12/2020.    03/03/2021 Survivorship   SCP delivered virtually by  Cira Rue, NP      CURRENT THERAPY: tamoxifen 20 mg starting 12/2020, on hold since 05/31/22 due to depression, weight loss, low appetite, nausea  INTERVAL HISTORY: Ms. Cornia returns for close follow up.  Last seen by me 6/14. Tamoxifen was held due to worsening depression, decreased appetite, weight loss, and nausea. She had an echo 6/23 that showed EF 60-65%.    REVIEW OF SYSTEMS:   Constitutional: Denies fevers, chills or abnormal weight loss Eyes: Denies blurriness of vision Ears, nose, mouth, throat, and face: Denies mucositis or sore throat Respiratory: Denies cough, dyspnea or wheezes Cardiovascular: Denies palpitation, chest discomfort or lower extremity swelling Gastrointestinal:  Denies nausea, heartburn or change in bowel habits Skin: Denies abnormal skin rashes Lymphatics: Denies new lymphadenopathy or easy bruising Neurological:Denies numbness, tingling or new weaknesses Behavioral/Psych: Mood is stable, no new changes  All other systems were reviewed with the patient and are negative.  MEDICAL HISTORY:  Past Medical History:  Diagnosis Date   Anxiety    Back pain    Cancer (HCC)    Depression    Emphysema    Esophageal spasm    Lung nodule    OSA (obstructive sleep apnea)    Osteomalacia    Vertigo    Vitamin D deficiency     SURGICAL HISTORY: Past Surgical History:  Procedure Laterality Date   ABDOMINAL HYSTERECTOMY     APPENDECTOMY     BREAST EXCISIONAL BIOPSY Left 1978   BREAST LUMPECTOMY WITH RADIOACTIVE SEED LOCALIZATION Right 09/30/2020   Procedure: RIGHT BREAST LUMPECTOMY WITH RADIOACTIVE SEED LOCALIZATION;  Surgeon: Jovita Kussmaul, MD;  Location: [place];  Service: General;  Laterality: Right;   CARPAL TUNNEL RELEASE     ROTATOR CUFF REPAIR      I have reviewed the social history and family history with the patient and they are unchanged from previous note.  ALLERGIES:  is allergic to adhesive [tape] and latex.  MEDICATIONS:  Current Outpatient Medications  Medication Sig Dispense Refill   acetaminophen (TYLENOL) 650 MG CR tablet Take 650 mg by mouth 2 (two) times daily as needed for pain.      albuterol (PROVENTIL  HFA;VENTOLIN HFA) 108 (90 Base) MCG/ACT inhaler Inhale 2 puffs into the lungs every 6 (six) hours as needed for wheezing or shortness of breath.     ALPRAZolam (XANAX) 0.25 MG tablet Take 0.25 mg by mouth daily as needed.     aspirin EC 81 MG tablet Take 81 mg by mouth daily.     atorvastatin (LIPITOR) 10 MG tablet Take 10 mg by mouth at bedtime.     cariprazine (VRAYLAR) 3 MG capsule 1 capsule     Cholecalciferol (VITAMIN D) 2000 units tablet Take 4,000 Units by mouth daily.      escitalopram (LEXAPRO) 20 MG tablet Take 40 mg by mouth daily.  2   fluticasone (FLONASE) 50 MCG/ACT nasal spray Place 2 sprays into both nostrils daily.     Multiple Vitamin (MULTIVITAMIN WITH MINERALS) TABS tablet Take 1 tablet by mouth daily.     omeprazole (PRILOSEC) 40 MG capsule Take 40 mg by mouth daily.     ondansetron (ZOFRAN) 4 MG tablet Take 1 tablet (4 mg total) by mouth every 8 (eight) hours as needed for nausea or vomiting. 20 tablet 1   Polyethyl Glycol-Propyl Glycol (SYSTANE) 0.4-0.3 % SOLN Place 1 drop into both eyes daily as needed (dry eyes).     tamoxifen (NOLVADEX) 20 MG tablet  Take 1 tablet (20 mg total) by mouth daily. 90 tablet 3   tiotropium (SPIRIVA) 18 MCG inhalation capsule Place 18 mcg into inhaler and inhale daily.     No current facility-administered medications for this visit.    PHYSICAL EXAMINATION: ECOG PERFORMANCE STATUS: {CHL ONC ECOG PS:[phone]}  There were no vitals filed for this visit. There were no vitals filed for this visit.  GENERAL:alert, no distress and comfortable SKIN: skin color, texture, turgor are normal, no rashes or significant lesions EYES: normal, Conjunctiva are pink and non-injected, sclera clear OROPHARYNX:no exudate, no erythema and lips, buccal mucosa, and tongue normal  NECK: supple, thyroid normal size, non-tender, without nodularity LYMPH:  no palpable lymphadenopathy in the cervical, axillary or inguinal LUNGS: clear to auscultation and  percussion with normal breathing effort HEART: regular rate & rhythm and no murmurs and no lower extremity edema ABDOMEN:abdomen soft, non-tender and normal bowel sounds Musculoskeletal:no cyanosis of digits and no clubbing  NEURO: alert & oriented x 3 with fluent speech, no focal motor/sensory deficits  LABORATORY DATA:  I have reviewed the data as listed    Latest Ref Rng & Units 05/31/2022    1:11 PM 05/06/2022    6:12 PM 12/02/2021    2:57 PM  CBC  WBC 4.0 - 10.5 K/uL 6.7  5.9  6.5   Hemoglobin 12.0 - 15.0 g/dL 12.7  12.6  12.9   Hematocrit 36.0 - 46.0 % 37.3  36.5  38.2   Platelets 150 - 400 K/uL 148  140  157         Latest Ref Rng & Units 05/31/2022    1:11 PM 05/06/2022    6:12 PM 12/02/2021    2:57 PM  CMP  Glucose 70 - 99 mg/dL 145  110  120   BUN 8 - 23 mg/dL _0 Creatinine 0.44 - 1.00 mg/dL 0.66  0.71  0.73   Sodium 135 - 145 mmol/L 141  139  140   Potassium 3.5 - 5.1 mmol/L 3.9  3.4  4.0   Chloride 98 - 111 mmol/L 106  108  106   CO2 22 - 32 mmol/L _1 Calcium 8.9 - 10.3 mg/dL 9.2  8.4  9.0   Total Protein 6.5 - 8.1 g/dL 5.9   6.2   Total Bilirubin 0.3 - 1.2 mg/dL 0.4   0.4   Alkaline Phos 38 - 126 U/L 36   48   AST 15 - 41 U/L 12   14   ALT 0 - 44 U/L 10   9       RADIOGRAPHIC STUDIES: I have personally reviewed the radiological images as listed and agreed with the findings in the report. No results found.   ASSESSMENT & PLAN:  No problem-specific Assessment & Plan notes found for this encounter.   No orders of the defined types were placed in this encounter.  All questions were answered. The patient knows to call the clinic with any problems, questions or concerns. No barriers to learning was detected. I spent {CHL ONC TIME VISIT - MCEYE:[id number]} counseling the patient face to face. The total time spent in the appointment was {CHL ONC TIME VISIT - PNPYY:[id number]} and more than 50% was on counseling and review of test results      Alla Feeling, NP 06/25/22

## 2022-06-26 DIAGNOSIS — H1033 Unspecified acute conjunctivitis, bilateral: Secondary | ICD-10-CM | POA: Diagnosis not present

## 2022-06-29 ENCOUNTER — Inpatient Hospital Stay: Payer: Medicare PPO | Attending: Nurse Practitioner | Admitting: Nurse Practitioner

## 2022-06-29 ENCOUNTER — Telehealth: Payer: Self-pay | Admitting: Hematology

## 2022-06-29 ENCOUNTER — Other Ambulatory Visit: Payer: Self-pay

## 2022-06-29 ENCOUNTER — Encounter: Payer: Self-pay | Admitting: Surgery

## 2022-06-29 ENCOUNTER — Encounter: Payer: Self-pay | Admitting: Nurse Practitioner

## 2022-06-29 VITALS — BP 122/72 | HR 76 | Temp 97.7°F | Resp 14 | Wt 132.4 lb

## 2022-06-29 DIAGNOSIS — Z170 Estrogen receptor positive status [ER+]: Secondary | ICD-10-CM | POA: Diagnosis not present

## 2022-06-29 DIAGNOSIS — R634 Abnormal weight loss: Secondary | ICD-10-CM

## 2022-06-29 DIAGNOSIS — C50211 Malignant neoplasm of upper-inner quadrant of right female breast: Secondary | ICD-10-CM

## 2022-06-29 DIAGNOSIS — Z79810 Long term (current) use of selective estrogen receptor modulators (SERMs): Secondary | ICD-10-CM | POA: Insufficient documentation

## 2022-06-29 DIAGNOSIS — Z923 Personal history of irradiation: Secondary | ICD-10-CM | POA: Insufficient documentation

## 2022-06-29 MED ORDER — ALPRAZOLAM 0.25 MG PO TABS: 0.2500 mg | ORAL_TABLET | Freq: Every day | ORAL | 0 refills | Status: DC | PRN

## 2022-06-29 NOTE — Progress Notes (Signed)
Per Cira Rue, NP, I faxed the pt's office notes from today to her mental health provider Janeth Rase, at [facility].  Fax confirmation received.

## 2022-06-29 NOTE — Telephone Encounter (Signed)
Scheduled follow-up appointment per 7/13 los. [name] is aware.

## 2022-07-07 ENCOUNTER — Telehealth: Payer: Self-pay | Admitting: Dietician

## 2022-07-07 ENCOUNTER — Inpatient Hospital Stay: Payer: Medicare PPO | Admitting: Dietician

## 2022-07-07 NOTE — Telephone Encounter (Signed)
Nutrition Assessment   Reason for Assessment: Provider referral    ASSESSMENT: 88 year old female on anti-estrogen oral therapy with tamoxifen for breast cancer (started 12/2020). Treatment on hold since 05/31/22 due to depression, wt loss, low appetite, nausea. Patient completed adjuvant radiation with Dr. Isidore Moos 11/2020.  Past medical history includes anxiety, depression, emphysema, OSA, osteomalacia, vertigo, vit D deficiency.   Patient did not answer for scheduled telephone visit. Left VM with request for return call. Contact information provided.

## 2022-07-25 DIAGNOSIS — H353211 Exudative age-related macular degeneration, right eye, with active choroidal neovascularization: Secondary | ICD-10-CM | POA: Diagnosis not present

## 2022-07-28 DIAGNOSIS — H353212 Exudative age-related macular degeneration, right eye, with inactive choroidal neovascularization: Secondary | ICD-10-CM | POA: Diagnosis not present

## 2022-07-28 DIAGNOSIS — H353122 Nonexudative age-related macular degeneration, left eye, intermediate dry stage: Secondary | ICD-10-CM | POA: Diagnosis not present

## 2022-07-28 DIAGNOSIS — H33322 Round hole, left eye: Secondary | ICD-10-CM | POA: Diagnosis not present

## 2022-07-28 DIAGNOSIS — H43813 Vitreous degeneration, bilateral: Secondary | ICD-10-CM | POA: Diagnosis not present

## 2022-08-10 ENCOUNTER — Encounter: Payer: Self-pay | Admitting: Nurse Practitioner

## 2022-08-11 ENCOUNTER — Telehealth: Payer: Self-pay | Admitting: Dietician

## 2022-08-11 NOTE — Telephone Encounter (Signed)
Contacted patient to scheduled appointments. Patient is aware of appointments that are scheduled.   

## 2022-08-14 ENCOUNTER — Telehealth: Payer: Self-pay | Admitting: Nurse Practitioner

## 2022-08-14 NOTE — Telephone Encounter (Signed)
I called pt's daughter, [name]. She remains concerned about her mother's continued weight loss, looks "skeletal," and sleeping a lot. She went back on welbutrin and mentally seems better. No other physical complaints except mild constipation. Pt now has an aide to help out and [name] plans to speak with nutritionist this week. Family is going to the beach where there will be encouragement and assistance with [name].   I reviewed possible next steps for work up if that is the patient's wishes/goals. Prefers to see how the beach goes and if she improves with aide/family assistance first. She will keep 9/15 f/up to discuss with Dr. Burr Medico at that point. She appreciated the call.   Cira Rue, NP

## 2022-08-16 DIAGNOSIS — F410 Panic disorder [episodic paroxysmal anxiety] without agoraphobia: Secondary | ICD-10-CM | POA: Diagnosis not present

## 2022-08-16 DIAGNOSIS — F331 Major depressive disorder, recurrent, moderate: Secondary | ICD-10-CM | POA: Diagnosis not present

## 2022-08-18 ENCOUNTER — Inpatient Hospital Stay: Payer: Medicare PPO | Attending: Nurse Practitioner | Admitting: Dietician

## 2022-08-18 DIAGNOSIS — Z7982 Long term (current) use of aspirin: Secondary | ICD-10-CM | POA: Insufficient documentation

## 2022-08-18 DIAGNOSIS — C50211 Malignant neoplasm of upper-inner quadrant of right female breast: Secondary | ICD-10-CM | POA: Insufficient documentation

## 2022-08-18 DIAGNOSIS — Z170 Estrogen receptor positive status [ER+]: Secondary | ICD-10-CM | POA: Insufficient documentation

## 2022-08-18 DIAGNOSIS — Z79899 Other long term (current) drug therapy: Secondary | ICD-10-CM | POA: Insufficient documentation

## 2022-08-18 DIAGNOSIS — Z923 Personal history of irradiation: Secondary | ICD-10-CM | POA: Insufficient documentation

## 2022-08-18 DIAGNOSIS — Z79810 Long term (current) use of selective estrogen receptor modulators (SERMs): Secondary | ICD-10-CM | POA: Insufficient documentation

## 2022-08-21 NOTE — Progress Notes (Signed)
Nutrition Assessment   Reason for Assessment: Family request (poor appetite, wt loss)   ASSESSMENT: 88 year old female with right breast cancer, estrogen receptor positive. Patient completed adjuvant radiation under the care of Dr. Isidore Moos (11/2020). She is currently receiving Tamoxifen (started 12/2020). Patient is followed by Dr. Burr Medico.   Past medical history includes depression, anxiety, COPD, vertigo, OSA (noncompliant with CPAP), osteomalacia, vit D deficiency  Noted treatment on hold since 05/31/22 due to depression, weight loss, low appetite, nausea.   Spoke with daughter of patient via telephone. Pt declined to be on call today per daughter. Daughter endorses ongoing poor appetite and additional ~7 lb weight loss since last MD visit 7/13. Daughter reports pt endorses slight improvement to mood since starting Wellbutrin. She is eating 3 small meals. Per recall pt has small bowl of cereal with banana for breakfast. She has a snack (yogurt, peanut butter crackers) in the afternoon. Daughter orders weekly meals from [employer] (~500-600 kcal meals) that patient heats up for supper. She has a bowl of ice cream every other night. Patient will not drink supplements as these constipate her, but daughter is unsure if this is true. Patient lives alone. Daughter who resides in [place] continues to have conversations encouraging pt to consider moving in with her, however says pt remains adamant about living independently. Daughter reports she hiring home health. Patient will have an aide coming to the home from 10-3 PM a few times/week.   Nutrition Focused Physical Exam: unable to complete (telephone visit with daughter)   NUTRITION DIAGNOSIS: Inadequate oral intake related to social/environmental circumstances as evidenced by poor appetite, weight loss per family report   INTERVENTION:  Educated on high calorie high protein foods - snack ideas and shake recipes provided Discussed strategies for poor  appetite - handout provided  Provided alternate ideas for ONS (CIB with whole milk, [employer] Shakes) Suggested having frozen/ready to eat meals available to pt Educated on soft moist high protein foods for ease of intake Suggested contacting [employer] and placing pt on waiting list as well as contacting [employer] to learn about daytime senior programs available - contact information for [facility] provided   MONITORING, EVALUATION, GOAL: Patient will tolerate increased calories and protein to prevent further weight loss   Next Visit: No follow-up scheduled. Contact information provided, pt/family encouraged to contact with nutrition questions/concerns.

## 2022-08-28 ENCOUNTER — Ambulatory Visit
Admission: RE | Admit: 2022-08-28 | Discharge: 2022-08-28 | Disposition: A | Discharge status: Home / Self Care | Payer: Medicare PPO | Source: Ambulatory Visit | Attending: Hematology | Admitting: Hematology

## 2022-08-28 DIAGNOSIS — Z853 Personal history of malignant neoplasm of breast: Secondary | ICD-10-CM | POA: Diagnosis not present

## 2022-08-28 DIAGNOSIS — Z170 Estrogen receptor positive status [ER+]: Secondary | ICD-10-CM

## 2022-08-28 DIAGNOSIS — R922 Inconclusive mammogram: Secondary | ICD-10-CM | POA: Diagnosis not present

## 2022-08-28 HISTORY — DX: Personal history of irradiation: Z92.3

## 2022-08-31 ENCOUNTER — Other Ambulatory Visit: Payer: Self-pay

## 2022-08-31 DIAGNOSIS — Z170 Estrogen receptor positive status [ER+]: Secondary | ICD-10-CM

## 2022-09-01 ENCOUNTER — Other Ambulatory Visit: Payer: Self-pay

## 2022-09-01 ENCOUNTER — Inpatient Hospital Stay: Payer: Medicare PPO | Admitting: Hematology

## 2022-09-01 ENCOUNTER — Inpatient Hospital Stay: Payer: Medicare PPO

## 2022-09-01 VITALS — BP 114/56 | HR 69 | Temp 97.7°F | Ht 61.0 in | Wt 130.2 lb

## 2022-09-01 DIAGNOSIS — Z170 Estrogen receptor positive status [ER+]: Secondary | ICD-10-CM

## 2022-09-01 DIAGNOSIS — C50211 Malignant neoplasm of upper-inner quadrant of right female breast: Secondary | ICD-10-CM

## 2022-09-01 DIAGNOSIS — Z923 Personal history of irradiation: Secondary | ICD-10-CM | POA: Diagnosis not present

## 2022-09-01 DIAGNOSIS — Z7982 Long term (current) use of aspirin: Secondary | ICD-10-CM | POA: Diagnosis not present

## 2022-09-01 DIAGNOSIS — Z79810 Long term (current) use of selective estrogen receptor modulators (SERMs): Secondary | ICD-10-CM | POA: Diagnosis not present

## 2022-09-01 DIAGNOSIS — Z79899 Other long term (current) drug therapy: Secondary | ICD-10-CM | POA: Diagnosis not present

## 2022-09-01 LAB — CBC WITH DIFFERENTIAL (CANCER CENTER ONLY)
Abs Immature Granulocytes: 0.01 10*3/uL (ref 0.00–0.07)
Basophils Absolute: 0 10*3/uL (ref 0.0–0.1)
Basophils Relative: 0 %
Eosinophils Absolute: 0.2 10*3/uL (ref 0.0–0.5)
Eosinophils Relative: 4 %
HCT: 39.7 % (ref 36.0–46.0)
Hemoglobin: 13.4 g/dL (ref 12.0–15.0)
Immature Granulocytes: 0 %
Lymphocytes Relative: 13 %
Lymphs Abs: 0.6 10*3/uL — ABNORMAL LOW (ref 0.7–4.0)
MCH: 32.1 pg (ref 26.0–34.0)
MCHC: 33.8 g/dL (ref 30.0–36.0)
MCV: 95 fL (ref 80.0–100.0)
Monocytes Absolute: 0.4 10*3/uL (ref 0.1–1.0)
Monocytes Relative: 7 %
Neutro Abs: 3.9 10*3/uL (ref 1.7–7.7)
Neutrophils Relative %: 76 %
Platelet Count: 166 10*3/uL (ref 150–400)
RBC: 4.18 MIL/uL (ref 3.87–5.11)
RDW: 13.3 % (ref 11.5–15.5)
WBC Count: 5.1 10*3/uL (ref 4.0–10.5)
nRBC: 0 % (ref 0.0–0.2)

## 2022-09-01 LAB — CMP (CANCER CENTER ONLY)
ALT: 11 U/L (ref 0–44)
AST: 17 U/L (ref 15–41)
Albumin: 3.7 g/dL (ref 3.5–5.0)
Alkaline Phosphatase: 49 U/L (ref 38–126)
Anion gap: 5 (ref 5–15)
BUN: 16 mg/dL (ref 8–23)
CO2: 30 mmol/L (ref 22–32)
Calcium: 9.1 mg/dL (ref 8.9–10.3)
Chloride: 106 mmol/L (ref 98–111)
Creatinine: 0.69 mg/dL (ref 0.44–1.00)
GFR, Estimated: >60 mL/min (ref >60)
Glucose, Bld: 133 mg/dL — ABNORMAL HIGH (ref 70–99)
Potassium: 3.9 mmol/L (ref 3.5–5.1)
Sodium: 141 mmol/L (ref 135–145)
Total Bilirubin: 0.6 mg/dL (ref 0.3–1.2)
Total Protein: 6.2 g/dL — ABNORMAL LOW (ref 6.5–8.1)

## 2022-09-01 NOTE — Progress Notes (Signed)
[facility]   [phone] [phone]   Clinic Follow up Note   Patient Care Team: Cari Caraway, MD as PCP - General (Family Medicine) Buford Dresser, MD as PCP - Cardiology (Cardiology) Mauro Kaufmann, RN as Oncology Nurse Navigator Rockwell Germany, RN as Oncology Nurse Navigator Jovita Kussmaul, MD as Consulting Physician (General Surgery) Truitt Merle, MD as Consulting Physician (Hematology) Eppie Gibson, MD as Attending Physician (Radiation Oncology) Alla Feeling, NP as Nurse Practitioner (Nurse Practitioner)  Date of Service:  09/01/2022  CHIEF COMPLAINT: f/u of right breast cancer  CURRENT THERAPY:  Surveillance  ASSESSMENT & PLAN:  [name] is a 88 y.o. post-hysterectomy female with   Malignant neoplasm of upper-inner quadrant of right breast, Stage IA, P(T1cNxM0), ER+/PR+/HER2-, Grade II -Diagnosed in 08/2020. S/p right lumpectomy with Dr Marlou Starks on 09/30/20, pathology showed: 1.3cm invasive cancer, G2, with low grade DCIS; positive anterior margins with DCIS.   -s/p Radiation with Dr Isidore Moos 11/07/20 - 12/06/20.  -she started Tamoxifen 15m in 12/2020. Tolerated well until ~11/2021 when she had worsening depression/anxiety, weight loss, and nausea. On hold since 05/2022 -most recent mammogram 08/28/22 was benign. -Clinically doing well from a breast cancer standpoint.***  2. Depression, anxiety, decreased appetite, weight loss, nausea -depression previously well controlled on wellbutrin and lexapro. Has xanax for acute anxiety. -switched wellbutrin to vraylar to start tamoxifen. -symptoms worsened 11/2021 while on tamoxifen -work up for weakness in ED on 05/06/22 was negative. -tamoxifen held since 05/31/22, mental symptoms improved -she was put back on wellbutrin 07/2022 -connected with dietician 08/18/22   3. History of falls, [facility]  -previous falls in 2020-2021, worst in 02/2020 which resulted in rib fracture with  pneumothorax -She ambulates with walker and cane now and uses medical alert necklace. She was very active at home with chores, but not much lately due to concern with another fall. -DEXA on 08/25/21 was normal (T-score -1.0)   PLAN: ***   No problem-specific Assessment & Plan notes found for this encounter.   SUMMARY OF ONCOLOGIC HISTORY: Oncology History Overview Note  Cancer Staging Malignant neoplasm of upper-inner quadrant of right breast in female, estrogen receptor positive ([place] Staging form: Breast, AJCC 8th Edition - Clinical stage from 08/31/2020: Stage IA (cT1b, cN0, cM0, G2, ER+, PR+, HER2-) - Signed by FTruitt Merle MD on 09/07/2020    Malignant neoplasm of upper-inner quadrant of right breast in female, estrogen receptor positive ([place]  08/02/2020 Mammogram   IMPRESSION: Interval increase in size of an asymmetry in the slightly medial right breast. Patient was unable to tolerate an mL view today due to a rash underneath her breast. There is a possible subtle sonographic correlate at 4:30 o'clock 6 cm from the nipple. This measures 0.9 x 0.7 x 0.6 cm.   08/31/2020 Cancer Staging   Staging form: Breast, AJCC 8th Edition - Clinical stage from 08/31/2020: Stage IA (cT1b, cN0, cM0, G2, ER+, PR+, HER2-) - Signed by FTruitt Merle MD on 09/07/2020   08/31/2020 Initial Biopsy   Diagnosis Breast, right, needle core biopsy, LIQ, coil clip - INVASIVE DUCTAL CARCINOMA - SEE COMMENT Microscopic Comment Based on the biopsy, the carcinoma appears Nottingham grade 2 of 3, has foci of extracellular mucin and measures 1 cm in greatest linear extent. Prognostic markers (ER/PR/ki-67/HER2) are pending and will be reported in an addendum. Dr. CJeannie Donereviewed the case and agrees with the above diagnosis. [facility]   08/31/2020 Receptors her2   ADDITIONAL INFORMATION: Her2 is NEGATIVE (0).   Results: IMMUNOHISTOCHEMICAL  AND MORPHOMETRIC ANALYSIS PERFORMED MANUALLY Estrogen Receptor: 95%, POSITIVE, STRONG STAINING INTENSITY Progesterone Receptor: 95%, POSITIVE, STRONG STAINING INTENSITY Proliferation Marker Ki67: 10%   09/03/2020 Initial Diagnosis   Malignant neoplasm of upper-inner quadrant of right breast in female, estrogen receptor positive ([place])   09/30/2020 Surgery   RIGHT BREAST LUMPECTOMY WITH RADIOACTIVE SEED LOCALIZATION by Dr Marlou Starks   09/30/2020 Pathology Results   FINAL MICROSCOPIC DIAGNOSIS:   A. BREAST, RIGHT, LUMPECTOMY:  - Invasive ductal carcinoma with extracellular mucin, 1.3 cm, grade 2  - Ductal carcinoma in situ low-grade  - Resection margins are negative for invasive carcinoma; closest  anterior margin at less than 1 mm and lateral margin at 2 mm  - DCIS focally involves the anterior margin and is 1 mm from the lateral  margin  - Negative for lymphovascular perineural invasion  - Biopsy site changes  - See oncology table    09/30/2020 Cancer Staging   Staging form: Breast, AJCC 8th Edition - Pathologic stage from 09/30/2020: Stage Unknown (pT1c, pNX, cM0, G2, ER+, PR+, HER2-) - Signed by Truitt Merle, MD on 01/13/2021   11/07/2020 - 12/06/2020 Radiation Therapy   Adjuvant Radiation with Dr Isidore Moos    12/2020 -  Anti-estrogen oral therapy   Tamoxifen 49m once daily starting in 12/2020.    03/03/2021 Survivorship   SCP delivered virtually by LCira Rue NP       INTERVAL HISTORY:  FASHLON LOTTMANis here for a follow up of breast cancer. She was last seen by NP Lacie on 06/29/22. She presents to the clinic accompanied by ***.   All other systems were reviewed with the patient and are negative.  MEDICAL HISTORY:  Past Medical History:  Diagnosis Date   Anxiety    Back pain    Cancer (HCC)    Depression    Emphysema    Esophageal spasm    Lung nodule    OSA (obstructive sleep apnea)    Osteomalacia    Personal history of radiation therapy    Vertigo    Vitamin D  deficiency     SURGICAL HISTORY: Past Surgical History:  Procedure Laterality Date   ABDOMINAL HYSTERECTOMY     APPENDECTOMY     BREAST BIOPSY Right 08/31/2020   BREAST EXCISIONAL BIOPSY Left 1978   BREAST LUMPECTOMY Right 09/30/2020   BREAST LUMPECTOMY WITH RADIOACTIVE SEED LOCALIZATION Right 09/30/2020   Procedure: RIGHT BREAST LUMPECTOMY WITH RADIOACTIVE SEED LOCALIZATION;  Surgeon: TJovita Kussmaul MD;  [facility]  Service: General;  Laterality: Right;   CARPAL TUNNEL RELEASE     ROTATOR CUFF REPAIR      I have reviewed the social history and family history with the patient and they are unchanged from previous note.  ALLERGIES:  is allergic to adhesive [tape] and latex.  MEDICATIONS:  Current Outpatient Medications  Medication Sig Dispense Refill   acetaminophen (TYLENOL) 650 MG CR tablet Take 650 mg by mouth 2 (two) times daily as needed for pain.      albuterol (PROVENTIL HFA;VENTOLIN HFA) 108 (90 Base) MCG/ACT inhaler Inhale 2 puffs into the lungs every 6 (six) hours as needed for wheezing or shortness of breath.     ALPRAZolam (XANAX) 0.25 MG tablet Take 1 tablet (0.25 mg total) by mouth daily as needed. 15 tablet 0   aspirin EC 81 MG tablet Take  81 mg by mouth daily.     atorvastatin (LIPITOR) 10 MG tablet Take 10 mg by mouth at bedtime.     cariprazine (VRAYLAR) 3 MG capsule 1 capsule     Cholecalciferol (VITAMIN D) 2000 units tablet Take 4,000 Units by mouth daily.      escitalopram (LEXAPRO) 20 MG tablet Take 40 mg by mouth daily.  2   fluticasone (FLONASE) 50 MCG/ACT nasal spray Place 2 sprays into both nostrils daily.     Multiple Vitamin (MULTIVITAMIN WITH MINERALS) TABS tablet Take 1 tablet by mouth daily.     omeprazole (PRILOSEC) 40 MG capsule Take 40 mg by mouth daily.     ondansetron (ZOFRAN) 4 MG tablet Take 1 tablet (4 mg total) by mouth every 8 (eight) hours as needed for nausea or vomiting. 20 tablet 1   Polyethyl Glycol-Propyl  Glycol (SYSTANE) 0.4-0.3 % SOLN Place 1 drop into both eyes daily as needed (dry eyes).     tiotropium (SPIRIVA) 18 MCG inhalation capsule Place 18 mcg into inhaler and inhale daily.     No current facility-administered medications for this visit.    PHYSICAL EXAMINATION: ECOG PERFORMANCE STATUS: {CHL ONC ECOG [phone]}  There were no vitals filed for this visit. Wt Readings from Last 3 Encounters:  06/29/22 132 lb 6.4 oz (60.1 kg)  05/31/22 136 lb 9.6 oz (62 kg)  05/25/22 135 lb 9.6 oz (61.5 kg)     GENERAL:alert, no distress and comfortable SKIN: skin color, texture, turgor are normal, no rashes or significant lesions EYES: normal, Conjunctiva are pink and non-injected, sclera clear {OROPHARYNX:no exudate, no erythema and lips, buccal mucosa, and tongue normal}  NECK: supple, thyroid normal size, non-tender, without nodularity LYMPH:  no palpable lymphadenopathy in the cervical, axillary LUNGS: clear to auscultation and percussion with normal breathing effort HEART: regular rate & rhythm and no murmurs and no lower extremity edema ABDOMEN:abdomen soft, non-tender and normal bowel sounds Musculoskeletal:no cyanosis of digits and no clubbing  NEURO: alert & oriented x 3 with fluent speech, no focal motor/sensory deficits BREAST: *** No palpable mass, nodules or adenopathy bilaterally. Breast exam benign.   LABORATORY DATA:  I have reviewed the data as listed    Latest Ref Rng & Units 05/31/2022    1:11 PM 05/06/2022    6:12 PM 12/02/2021    2:57 PM  CBC  WBC 4.0 - 10.5 K/uL 6.7  5.9  6.5   Hemoglobin 12.0 - 15.0 g/dL 12.7  12.6  12.9   Hematocrit 36.0 - 46.0 % 37.3  36.5  38.2   Platelets 150 - 400 K/uL 148  140  157         Latest Ref Rng & Units 05/31/2022    1:11 PM 05/06/2022    6:12 PM 12/02/2021    2:57 PM  CMP  Glucose 70 - 99 mg/dL 145  110  120   BUN 8 - 23 mg/dL 13  14  15    Creatinine 0.44 - 1.00 mg/dL 0.66  0.71  0.73   Sodium 135 - 145 mmol/L 141   139  140   Potassium 3.5 - 5.1 mmol/L 3.9  3.4  4.0   Chloride 98 - 111 mmol/L 106  108  106   CO2 22 - 32 mmol/L 30  26  26    Calcium 8.9 - 10.3 mg/dL 9.2  8.4  9.0   Total Protein 6.5 - 8.1 g/dL 5.9   6.2   Total Bilirubin 0.3 -  1.2 mg/dL 0.4   0.4   Alkaline Phos 38 - 126 U/L 36   48   AST 15 - 41 U/L 12   14   ALT 0 - 44 U/L 10   9       RADIOGRAPHIC STUDIES: I have personally reviewed the radiological images as listed and agreed with the findings in the report. No results found.    No orders of the defined types were placed in this encounter.  All questions were answered. The patient knows to call the clinic with any problems, questions or concerns. No barriers to learning was detected. The total time spent in the appointment was {CHL ONC TIME VISIT - ZHYQM:[id number]}.     Aurea Graff 09/01/2022   I, Wilburn Mylar, am acting as scribe for Truitt Merle, MD.   {Add scribe attestation statement}

## 2022-09-03 ENCOUNTER — Encounter: Payer: Self-pay | Admitting: Hematology

## 2022-09-04 ENCOUNTER — Telehealth: Payer: Self-pay | Admitting: Hematology

## 2022-09-04 NOTE — Telephone Encounter (Signed)
Scheduled follow-up appointment per 9/15 los. [name] is aware.

## 2022-09-21 DIAGNOSIS — I700 Atherosclerosis of aorta: Secondary | ICD-10-CM | POA: Diagnosis not present

## 2022-09-21 DIAGNOSIS — J449 Chronic obstructive pulmonary disease, unspecified: Secondary | ICD-10-CM | POA: Diagnosis not present

## 2022-09-21 DIAGNOSIS — E559 Vitamin D deficiency, unspecified: Secondary | ICD-10-CM | POA: Diagnosis not present

## 2022-09-21 DIAGNOSIS — Z6823 Body mass index (BMI) 23.0-23.9, adult: Secondary | ICD-10-CM | POA: Diagnosis not present

## 2022-09-21 DIAGNOSIS — C50211 Malignant neoplasm of upper-inner quadrant of right female breast: Secondary | ICD-10-CM | POA: Diagnosis not present

## 2022-09-21 DIAGNOSIS — F3341 Major depressive disorder, recurrent, in partial remission: Secondary | ICD-10-CM | POA: Diagnosis not present

## 2022-09-21 DIAGNOSIS — E782 Mixed hyperlipidemia: Secondary | ICD-10-CM | POA: Diagnosis not present

## 2022-09-21 DIAGNOSIS — M62838 Other muscle spasm: Secondary | ICD-10-CM | POA: Diagnosis not present

## 2022-09-21 DIAGNOSIS — Z23 Encounter for immunization: Secondary | ICD-10-CM | POA: Diagnosis not present

## 2022-10-31 DIAGNOSIS — H353211 Exudative age-related macular degeneration, right eye, with active choroidal neovascularization: Secondary | ICD-10-CM | POA: Diagnosis not present

## 2022-11-02 DIAGNOSIS — F3342 Major depressive disorder, recurrent, in full remission: Secondary | ICD-10-CM | POA: Diagnosis not present

## 2022-11-02 DIAGNOSIS — J029 Acute pharyngitis, unspecified: Secondary | ICD-10-CM | POA: Diagnosis not present

## 2022-11-02 DIAGNOSIS — Z6823 Body mass index (BMI) 23.0-23.9, adult: Secondary | ICD-10-CM | POA: Diagnosis not present

## 2022-11-02 DIAGNOSIS — R300 Dysuria: Secondary | ICD-10-CM | POA: Diagnosis not present

## 2022-11-07 DIAGNOSIS — F410 Panic disorder [episodic paroxysmal anxiety] without agoraphobia: Secondary | ICD-10-CM | POA: Diagnosis not present

## 2022-11-07 DIAGNOSIS — F331 Major depressive disorder, recurrent, moderate: Secondary | ICD-10-CM | POA: Diagnosis not present

## 2022-11-27 DIAGNOSIS — H903 Sensorineural hearing loss, bilateral: Secondary | ICD-10-CM | POA: Diagnosis not present

## 2022-12-26 DIAGNOSIS — H43819 Vitreous degeneration, unspecified eye: Secondary | ICD-10-CM | POA: Diagnosis not present

## 2022-12-26 DIAGNOSIS — H353122 Nonexudative age-related macular degeneration, left eye, intermediate dry stage: Secondary | ICD-10-CM | POA: Diagnosis not present

## 2022-12-26 DIAGNOSIS — H353211 Exudative age-related macular degeneration, right eye, with active choroidal neovascularization: Secondary | ICD-10-CM | POA: Diagnosis not present

## 2023-01-18 DIAGNOSIS — F410 Panic disorder [episodic paroxysmal anxiety] without agoraphobia: Secondary | ICD-10-CM | POA: Diagnosis not present

## 2023-01-18 DIAGNOSIS — F331 Major depressive disorder, recurrent, moderate: Secondary | ICD-10-CM | POA: Diagnosis not present

## 2023-02-01 DIAGNOSIS — Z170 Estrogen receptor positive status [ER+]: Secondary | ICD-10-CM | POA: Diagnosis not present

## 2023-02-01 DIAGNOSIS — C50411 Malignant neoplasm of upper-outer quadrant of right female breast: Secondary | ICD-10-CM | POA: Diagnosis not present

## 2023-02-05 ENCOUNTER — Other Ambulatory Visit: Payer: Self-pay | Admitting: Hematology

## 2023-02-05 DIAGNOSIS — N6310 Unspecified lump in the right breast, unspecified quadrant: Secondary | ICD-10-CM

## 2023-02-05 DIAGNOSIS — Z853 Personal history of malignant neoplasm of breast: Secondary | ICD-10-CM

## 2023-02-20 DIAGNOSIS — H353211 Exudative age-related macular degeneration, right eye, with active choroidal neovascularization: Secondary | ICD-10-CM | POA: Diagnosis not present

## 2023-02-20 DIAGNOSIS — H353122 Nonexudative age-related macular degeneration, left eye, intermediate dry stage: Secondary | ICD-10-CM | POA: Diagnosis not present

## 2023-02-20 DIAGNOSIS — H43819 Vitreous degeneration, unspecified eye: Secondary | ICD-10-CM | POA: Diagnosis not present

## 2023-03-08 DIAGNOSIS — F410 Panic disorder [episodic paroxysmal anxiety] without agoraphobia: Secondary | ICD-10-CM | POA: Diagnosis not present

## 2023-03-08 DIAGNOSIS — F331 Major depressive disorder, recurrent, moderate: Secondary | ICD-10-CM | POA: Diagnosis not present

## 2023-03-20 DIAGNOSIS — E559 Vitamin D deficiency, unspecified: Secondary | ICD-10-CM | POA: Diagnosis not present

## 2023-03-20 DIAGNOSIS — Z1389 Encounter for screening for other disorder: Secondary | ICD-10-CM | POA: Diagnosis not present

## 2023-03-20 DIAGNOSIS — Z0000 Encounter for general adult medical examination without abnormal findings: Secondary | ICD-10-CM | POA: Diagnosis not present

## 2023-03-20 DIAGNOSIS — E782 Mixed hyperlipidemia: Secondary | ICD-10-CM | POA: Diagnosis not present

## 2023-03-20 DIAGNOSIS — Z6824 Body mass index (BMI) 24.0-24.9, adult: Secondary | ICD-10-CM | POA: Diagnosis not present

## 2023-03-26 ENCOUNTER — Ambulatory Visit
Admission: RE | Admit: 2023-03-26 | Discharge: 2023-03-26 | Disposition: A | Discharge status: Home / Self Care | Payer: Medicare PPO | Source: Ambulatory Visit | Attending: Hematology | Admitting: Hematology

## 2023-03-26 DIAGNOSIS — R92331 Mammographic heterogeneous density, right breast: Secondary | ICD-10-CM | POA: Diagnosis not present

## 2023-03-26 DIAGNOSIS — N6001 Solitary cyst of right breast: Secondary | ICD-10-CM | POA: Diagnosis not present

## 2023-03-26 DIAGNOSIS — N6310 Unspecified lump in the right breast, unspecified quadrant: Secondary | ICD-10-CM

## 2023-03-26 DIAGNOSIS — Z853 Personal history of malignant neoplasm of breast: Secondary | ICD-10-CM

## 2023-03-29 DIAGNOSIS — G4733 Obstructive sleep apnea (adult) (pediatric): Secondary | ICD-10-CM | POA: Diagnosis not present

## 2023-03-29 DIAGNOSIS — E559 Vitamin D deficiency, unspecified: Secondary | ICD-10-CM | POA: Diagnosis not present

## 2023-03-29 DIAGNOSIS — K219 Gastro-esophageal reflux disease without esophagitis: Secondary | ICD-10-CM | POA: Diagnosis not present

## 2023-03-29 DIAGNOSIS — I739 Peripheral vascular disease, unspecified: Secondary | ICD-10-CM | POA: Diagnosis not present

## 2023-03-29 DIAGNOSIS — Z853 Personal history of malignant neoplasm of breast: Secondary | ICD-10-CM | POA: Diagnosis not present

## 2023-03-29 DIAGNOSIS — E782 Mixed hyperlipidemia: Secondary | ICD-10-CM | POA: Diagnosis not present

## 2023-03-29 DIAGNOSIS — I700 Atherosclerosis of aorta: Secondary | ICD-10-CM | POA: Diagnosis not present

## 2023-03-29 DIAGNOSIS — F3342 Major depressive disorder, recurrent, in full remission: Secondary | ICD-10-CM | POA: Diagnosis not present

## 2023-03-29 DIAGNOSIS — J449 Chronic obstructive pulmonary disease, unspecified: Secondary | ICD-10-CM | POA: Diagnosis not present

## 2023-04-02 DIAGNOSIS — D0421 Carcinoma in situ of skin of right ear and external auricular canal: Secondary | ICD-10-CM | POA: Diagnosis not present

## 2023-05-01 DIAGNOSIS — H43819 Vitreous degeneration, unspecified eye: Secondary | ICD-10-CM | POA: Diagnosis not present

## 2023-05-01 DIAGNOSIS — H353211 Exudative age-related macular degeneration, right eye, with active choroidal neovascularization: Secondary | ICD-10-CM | POA: Diagnosis not present

## 2023-05-01 DIAGNOSIS — H353122 Nonexudative age-related macular degeneration, left eye, intermediate dry stage: Secondary | ICD-10-CM | POA: Diagnosis not present

## 2023-06-07 DIAGNOSIS — F410 Panic disorder [episodic paroxysmal anxiety] without agoraphobia: Secondary | ICD-10-CM | POA: Diagnosis not present

## 2023-06-07 DIAGNOSIS — F331 Major depressive disorder, recurrent, moderate: Secondary | ICD-10-CM | POA: Diagnosis not present

## 2023-06-11 DIAGNOSIS — L578 Other skin changes due to chronic exposure to nonionizing radiation: Secondary | ICD-10-CM | POA: Diagnosis not present

## 2023-06-11 DIAGNOSIS — D692 Other nonthrombocytopenic purpura: Secondary | ICD-10-CM | POA: Diagnosis not present

## 2023-06-11 DIAGNOSIS — B078 Other viral warts: Secondary | ICD-10-CM | POA: Diagnosis not present

## 2023-06-11 DIAGNOSIS — D3612 Benign neoplasm of peripheral nerves and autonomic nervous system, upper limb, including shoulder: Secondary | ICD-10-CM | POA: Diagnosis not present

## 2023-06-11 DIAGNOSIS — Z85828 Personal history of other malignant neoplasm of skin: Secondary | ICD-10-CM | POA: Diagnosis not present

## 2023-06-11 DIAGNOSIS — L821 Other seborrheic keratosis: Secondary | ICD-10-CM | POA: Diagnosis not present

## 2023-06-11 DIAGNOSIS — L570 Actinic keratosis: Secondary | ICD-10-CM | POA: Diagnosis not present

## 2023-06-11 DIAGNOSIS — L853 Xerosis cutis: Secondary | ICD-10-CM | POA: Diagnosis not present

## 2023-06-26 DIAGNOSIS — H43813 Vitreous degeneration, bilateral: Secondary | ICD-10-CM | POA: Diagnosis not present

## 2023-06-26 DIAGNOSIS — H353211 Exudative age-related macular degeneration, right eye, with active choroidal neovascularization: Secondary | ICD-10-CM | POA: Diagnosis not present

## 2023-06-26 DIAGNOSIS — H353122 Nonexudative age-related macular degeneration, left eye, intermediate dry stage: Secondary | ICD-10-CM | POA: Diagnosis not present

## 2023-08-09 DIAGNOSIS — F410 Panic disorder [episodic paroxysmal anxiety] without agoraphobia: Secondary | ICD-10-CM | POA: Diagnosis not present

## 2023-08-09 DIAGNOSIS — F331 Major depressive disorder, recurrent, moderate: Secondary | ICD-10-CM | POA: Diagnosis not present

## 2023-08-21 DIAGNOSIS — H353124 Nonexudative age-related macular degeneration, left eye, advanced atrophic with subfoveal involvement: Secondary | ICD-10-CM | POA: Diagnosis not present

## 2023-08-21 DIAGNOSIS — H353211 Exudative age-related macular degeneration, right eye, with active choroidal neovascularization: Secondary | ICD-10-CM | POA: Diagnosis not present

## 2023-08-21 DIAGNOSIS — H43813 Vitreous degeneration, bilateral: Secondary | ICD-10-CM | POA: Diagnosis not present

## 2023-08-24 ENCOUNTER — Other Ambulatory Visit: Payer: Self-pay | Admitting: General Surgery

## 2023-08-24 ENCOUNTER — Encounter: Payer: Self-pay | Admitting: General Surgery

## 2023-08-24 DIAGNOSIS — Z9889 Other specified postprocedural states: Secondary | ICD-10-CM

## 2023-09-02 NOTE — Assessment & Plan Note (Signed)
Stage IA, P(T1cNxM0), ER+/PR+/HER2-, Grade II -Diagnosed in 08/2020. S/p right lumpectomy with Dr Carolynne Edouard on 09/30/20, pathology showed: 1.3cm invasive cancer, G2, with low grade DCIS; positive anterior margins with DCIS.   -s/p Radiation with Dr Basilio Cairo 11/07/20 - 12/06/20.  -she started Tamoxifen 20mg  in 12/2020. Tolerated well until ~11/2021 when she had worsening depression/anxiety, weight loss, and nausea. On hold since 05/2022 -most recent mammogram 08/28/22 was benign. -Clinically doing well from a breast cancer standpoint.  We will continue surveillance.  Given her advanced age, and poor tolerance to tamoxifen previously, I do not plan to restart her on tamoxifen or try AI.

## 2023-09-02 NOTE — Assessment & Plan Note (Signed)
depression previously well controlled on wellbutrin and lexapro. Has xanax for acute anxiety. -switched wellbutrin to vraylar to start tamoxifen. -symptoms worsened 11/2021 while on tamoxifen -work up for weakness in ED on 05/06/22 was negative. -tamoxifen held since 05/31/22, mental symptoms improved -she was put back on wellbutrin 07/2022, feeling better now

## 2023-09-03 ENCOUNTER — Inpatient Hospital Stay: Payer: Medicare PPO | Attending: Hematology

## 2023-09-03 ENCOUNTER — Inpatient Hospital Stay: Payer: Medicare PPO | Admitting: Hematology

## 2023-09-03 ENCOUNTER — Other Ambulatory Visit: Payer: Self-pay

## 2023-09-03 VITALS — BP 141/62 | HR 65 | Temp 98.4°F | Resp 18 | Ht 61.0 in | Wt 142.3 lb

## 2023-09-03 DIAGNOSIS — C50211 Malignant neoplasm of upper-inner quadrant of right female breast: Secondary | ICD-10-CM | POA: Diagnosis not present

## 2023-09-03 DIAGNOSIS — Z170 Estrogen receptor positive status [ER+]: Secondary | ICD-10-CM | POA: Insufficient documentation

## 2023-09-03 DIAGNOSIS — Z923 Personal history of irradiation: Secondary | ICD-10-CM | POA: Insufficient documentation

## 2023-09-03 DIAGNOSIS — Z79810 Long term (current) use of selective estrogen receptor modulators (SERMs): Secondary | ICD-10-CM | POA: Insufficient documentation

## 2023-09-03 DIAGNOSIS — F419 Anxiety disorder, unspecified: Secondary | ICD-10-CM | POA: Insufficient documentation

## 2023-09-03 DIAGNOSIS — F32A Depression, unspecified: Secondary | ICD-10-CM

## 2023-09-03 LAB — CBC WITH DIFFERENTIAL (CANCER CENTER ONLY)
Abs Immature Granulocytes: 0.02 10*3/uL (ref 0.00–0.07)
Basophils Absolute: 0 10*3/uL (ref 0.0–0.1)
Basophils Relative: 1 %
Eosinophils Absolute: 0.2 10*3/uL (ref 0.0–0.5)
Eosinophils Relative: 3 %
HCT: 40.3 % (ref 36.0–46.0)
Hemoglobin: 13.2 g/dL (ref 12.0–15.0)
Immature Granulocytes: 0 %
Lymphocytes Relative: 12 %
Lymphs Abs: 0.8 10*3/uL (ref 0.7–4.0)
MCH: 31.1 pg (ref 26.0–34.0)
MCHC: 32.8 g/dL (ref 30.0–36.0)
MCV: 95 fL (ref 80.0–100.0)
Monocytes Absolute: 0.5 10*3/uL (ref 0.1–1.0)
Monocytes Relative: 8 %
Neutro Abs: 4.8 10*3/uL (ref 1.7–7.7)
Neutrophils Relative %: 76 %
Platelet Count: 153 10*3/uL (ref 150–400)
RBC: 4.24 MIL/uL (ref 3.87–5.11)
RDW: 12.7 % (ref 11.5–15.5)
WBC Count: 6.4 10*3/uL (ref 4.0–10.5)
nRBC: 0 % (ref 0.0–0.2)

## 2023-09-03 LAB — CMP (CANCER CENTER ONLY)
ALT: 12 U/L (ref 0–44)
AST: 17 U/L (ref 15–41)
Albumin: 4 g/dL (ref 3.5–5.0)
Alkaline Phosphatase: 75 U/L (ref 38–126)
Anion gap: 4 — ABNORMAL LOW (ref 5–15)
BUN: 15 mg/dL (ref 8–23)
CO2: 31 mmol/L (ref 22–32)
Calcium: 9.4 mg/dL (ref 8.9–10.3)
Chloride: 103 mmol/L (ref 98–111)
Creatinine: 0.8 mg/dL (ref 0.44–1.00)
GFR, Estimated: >60 mL/min (ref >60)
Glucose, Bld: 89 mg/dL (ref 70–99)
Potassium: 4.3 mmol/L (ref 3.5–5.1)
Sodium: 138 mmol/L (ref 135–145)
Total Bilirubin: 0.5 mg/dL (ref 0.3–1.2)
Total Protein: 6.6 g/dL (ref 6.5–8.1)

## 2023-09-03 NOTE — Progress Notes (Signed)
[facility]   Telephone:(336) [phone] Fax:(336) [phone]   Clinic Follow up Note   [name], [name], [name], [name], [name], [name], [name], [name], NP as Nurse Practitioner (Nurse Practitioner)  Date of Service:  09/03/2023  CHIEF COMPLAINT: f/u of right breast cancer   CURRENT THERAPY:  Surveillance ASSESSMENT: [name] is a 89 y.o. female with   Malignant neoplasm of upper-inner quadrant of right breast in female, estrogen receptor positive (HCC) Stage IA, P(T1cNxM0), ER+/PR+/HER2-, Grade II -Diagnosed in 08/2020. S/p right lumpectomy with Dr Carolynne Edouard on 09/30/20, pathology showed: 1.3cm invasive cancer, G2, with low grade DCIS; positive anterior margins with DCIS.   -s/p Radiation with Dr Basilio Cairo 11/07/20 - 12/06/20.  -she started Tamoxifen 20mg  in 12/2020. Tolerated well until ~11/2021 when she had worsening depression/anxiety, weight loss, and nausea. On hold since 05/2022 -most recent mammogram 08/28/22 was benign. -Clinically doing well from a breast cancer standpoint.  We will continue surveillance.  Given her advanced age, and poor tolerance to tamoxifen previously, I do not plan to restart her on tamoxifen or try AI.  Depression depression previously well controlled on wellbutrin and lexapro. Has xanax for acute anxiety. -switched wellbutrin to vraylar to start tamoxifen. -symptoms worsened 11/2021 while on tamoxifen -work up for weakness in ED on 05/06/22 was negative. -tamoxifen held since 05/31/22, mental symptoms improved -she was put back on wellbutrin 07/2022, feeling better now      PLAN: - Pt has  mammogram schedule for 09/2023 -Continue cancer surveillance and Mammogram yearly. -lab and f/u in /2025 then yearly after   SUMMARY OF ONCOLOGIC HISTORY: Oncology History Overview Note  Cancer Staging Malignant neoplasm of upper-inner quadrant of right breast in female, estrogen receptor positive (HCC) Staging form: Breast, AJCC 8th Edition - Clinical stage from 08/31/2020: Stage IA (cT1b, cN0, cM0, G2, ER+, PR+, HER2-) - Signed by Malachy Mood, MD on 09/07/2020    Malignant neoplasm of upper-inner quadrant of right breast in female, estrogen receptor positive (HCC)  08/02/2020 Mammogram   IMPRESSION: Interval increase in size of an asymmetry in the slightly medial right breast. Patient was unable to tolerate an mL view today due to a rash underneath her breast. There is a possible subtle sonographic correlate at 4:30 o'clock 6 cm from the nipple. This measures 0.9 x 0.7 x 0.6 cm.   08/31/2020 Cancer Staging   Staging form: Breast, AJCC 8th Edition - Clinical stage from 08/31/2020: Stage IA (cT1b, cN0, cM0, G2, ER+, PR+, HER2-) - Signed by Malachy Mood, MD on 09/07/2020   08/31/2020 Initial Biopsy   Diagnosis Breast, right, needle core biopsy, LIQ, coil clip - INVASIVE DUCTAL CARCINOMA - SEE COMMENT Microscopic Comment Based on the biopsy, the carcinoma appears Nottingham grade 2 of 3, has foci of extracellular mucin and measures 1 cm in greatest linear extent. Prognostic markers (ER/PR/ki-67/HER2) are pending and will be reported in an addendum. Dr. Rayetta Pigg reviewed the case and agrees with the above diagnosis. These results were called to [facility] on September 01, 2020.   08/31/2020 Receptors her2   ADDITIONAL INFORMATION: Her2 is NEGATIVE (0).   Results: IMMUNOHISTOCHEMICAL AND MORPHOMETRIC ANALYSIS PERFORMED MANUALLY Estrogen Receptor:  95%, POSITIVE, STRONG STAINING INTENSITY Progesterone Receptor: 95%, POSITIVE, STRONG STAINING INTENSITY Proliferation  Marker Ki67: 10%   09/03/2020 Initial Diagnosis   Malignant neoplasm of upper-inner quadrant of right breast in female, estrogen receptor positive (HCC)   09/30/2020 Surgery   RIGHT BREAST LUMPECTOMY WITH RADIOACTIVE SEED LOCALIZATION by Dr Carolynne Edouard   09/30/2020 Pathology Results   FINAL MICROSCOPIC DIAGNOSIS:   A. BREAST, RIGHT, LUMPECTOMY:  - Invasive ductal carcinoma with extracellular mucin, 1.3 cm, grade 2  - Ductal carcinoma in situ low-grade  - Resection margins are negative for invasive carcinoma; closest  anterior margin at less than 1 mm and lateral margin at 2 mm  - DCIS focally involves the anterior margin and is 1 mm from the lateral  margin  - Negative for lymphovascular perineural invasion  - Biopsy site changes  - See oncology table    09/30/2020 Cancer Staging   Staging form: Breast, AJCC 8th Edition - Pathologic stage from 09/30/2020: Stage Unknown (pT1c, pNX, cM0, G2, ER+, PR+, HER2-) - Signed by Malachy Mood, MD on 01/13/2021   11/07/2020 - 12/06/2020 Radiation Therapy   Adjuvant Radiation with Dr Basilio Cairo    12/2020 -  Anti-estrogen oral therapy   Tamoxifen 20mg  once daily starting in 12/2020.    03/03/2021 Survivorship   SCP delivered virtually by Santiago Glad, NP       INTERVAL HISTORY:  [name] is here for a follow up of right breast cancer. She was last seen by me on 09/01/2022. She presents to the clinic accompanied by caretaker. Pt went to see Dr. Carolynne Edouard and ha d a mammogram and it was a cyst. Pt has no other concern about her breast. Pt state that she live alone and she able to care of herself.She does have a care taker that comes in for 5 hours a day.    All other systems were reviewed with the patient and are negative.  MEDICAL HISTORY:  Past Medical History:  Diagnosis Date   Anxiety    Back pain    Cancer (HCC)    Depression    Emphysema    Esophageal spasm    Lung nodule    OSA (obstructive sleep apnea)    Osteomalacia    Personal  history of radiation therapy    Vertigo    Vitamin D deficiency     SURGICAL HISTORY: Past Surgical History:  Procedure Laterality Date   ABDOMINAL HYSTERECTOMY     APPENDECTOMY     BREAST BIOPSY Right 08/31/2020   BREAST EXCISIONAL BIOPSY Left 1978   BREAST LUMPECTOMY Right 09/30/2020   BREAST LUMPECTOMY WITH RADIOACTIVE SEED LOCALIZATION Right 09/30/2020   Procedure: RIGHT BREAST LUMPECTOMY WITH RADIOACTIVE SEED LOCALIZATION;  Surgeon: Griselda Miner, MD;  Location: [facility];  Service: General;  Laterality: Right;   CARPAL TUNNEL RELEASE     ROTATOR CUFF REPAIR      I have reviewed the social history and family history with the patient and they are unchanged from previous note.  ALLERGIES:  is allergic to adhesive [tape] and latex.  MEDICATIONS:  Current Outpatient Medications  Medication Sig Dispense Refill   acetaminophen (TYLENOL) 650 MG CR tablet Take 650 mg by mouth 2 (two) times daily as needed for pain.      albuterol (PROVENTIL HFA;VENTOLIN HFA) 108 (90 Base) MCG/ACT inhaler Inhale 2 puffs into the lungs every 6 (six) hours as needed for wheezing or shortness of breath.     ALPRAZolam (XANAX) 0.25  MG tablet Take 1 tablet (0.25 mg total) by mouth daily as needed. 15 tablet 0   aspirin EC 81 MG tablet Take 81 mg by mouth daily.     atorvastatin (LIPITOR) 10 MG tablet Take 10 mg by mouth at bedtime.     cariprazine (VRAYLAR) 3 MG capsule 1 capsule     Cholecalciferol (VITAMIN D) 2000 units tablet Take 4,000 Units by mouth daily.      escitalopram (LEXAPRO) 20 MG tablet Take 40 mg by mouth daily.  2   fluticasone (FLONASE) 50 MCG/ACT nasal spray Place 2 sprays into both nostrils daily.     Multiple Vitamin (MULTIVITAMIN WITH MINERALS) TABS tablet Take 1 tablet by mouth daily.     omeprazole (PRILOSEC) 40 MG capsule Take 40 mg by mouth daily.     ondansetron (ZOFRAN) 4 MG tablet Take 1 tablet (4 mg total) by mouth every 8 (eight) hours as needed for nausea  or vomiting. 20 tablet 1   Polyethyl Glycol-Propyl Glycol (SYSTANE) 0.4-0.3 % SOLN Place 1 drop into both eyes daily as needed (dry eyes).     tiotropium (SPIRIVA) 18 MCG inhalation capsule Place 18 mcg into inhaler and inhale daily.     No current facility-administered medications for this visit.    PHYSICAL EXAMINATION: ECOG PERFORMANCE STATUS: 0 - Asymptomatic  Vitals:   09/03/23 1412  BP: (!) 141/62  Pulse: 65  Resp: 18  Temp: 98.4 F (36.9 C)  SpO2: 98%   Wt Readings from Last 3 Encounters:  09/03/23 142 lb 4.8 oz (64.5 kg)  09/01/22 130 lb 3.2 oz (59.1 kg)  06/29/22 132 lb 6.4 oz (60.1 kg)     GENERAL:alert, no distress and comfortable SKIN: skin color normal, no rashes or significant lesions EYES: normal, Conjunctiva are pink and non-injected, sclera clear  NEURO: alert & oriented x 3 with fluent speech NECK: (-) supple, thyroid normal size, non-tender, without nodularity LYMPH: (-) no palpable lymphadenopathy in the cervical, axillary  BREAST: Rt Breast nipple slightly inverted.no palpable mass, breast exam benign. LT breast , no palpable mass. Breast exam benign.  LABORATORY DATA:  I have reviewed the data as listed    Latest Ref Rng & Units 09/03/2023    1:52 PM 09/01/2022    2:22 PM 05/31/2022    1:11 PM  CBC  WBC 4.0 - 10.5 K/uL 6.4  5.1  6.7   Hemoglobin 12.0 - 15.0 g/dL 16.1  09.6  04.5   Hematocrit 36.0 - 46.0 % 40.3  39.7  37.3   Platelets 150 - 400 K/uL 153  166  148         Latest Ref Rng & Units 09/03/2023    1:52 PM 09/01/2022    2:22 PM 05/31/2022    1:11 PM  CMP  Glucose 70 - 99 mg/dL 89  409  811   BUN 8 - 23 mg/dL 15  16  13    Creatinine 0.44 - 1.00 mg/dL 9.14  7.82  9.56   Sodium 135 - 145 mmol/L 138  141  141   Potassium 3.5 - 5.1 mmol/L 4.3  3.9  3.9   Chloride 98 - 111 mmol/L 103  106  106   CO2 22 - 32 mmol/L 31  30  30    Calcium 8.9 - 10.3 mg/dL 9.4  9.1  9.2   Total Protein 6.5 - 8.1 g/dL 6.6  6.2  5.9   Total Bilirubin 0.3 - 1.2  mg/dL 0.5  0.6  0.4   Alkaline Phos 38 - 126 U/L 75  49  36   AST 15 - 41 U/L 17  17  12    ALT 0 - 44 U/L 12  11  10        RADIOGRAPHIC STUDIES: I have personally reviewed the radiological images as listed and agreed with the findings in the report. No results found.    No orders of the defined types were placed in this encounter.  All questions were answered. The patient knows to call the clinic with any problems, questions or concerns. No barriers to learning was detected. The total time spent in the appointment was 20 minutes.     Malachy Mood, MD 09/03/2023   Carolin Coy, CMA, am acting as scribe for Malachy Mood, MD.   I have reviewed the above documentation for accuracy and completeness, and I agree with the above.

## 2023-09-19 ENCOUNTER — Ambulatory Visit
Admission: RE | Admit: 2023-09-19 | Discharge: 2023-09-19 | Disposition: A | Discharge status: Home / Self Care | Payer: Medicare PPO | Source: Ambulatory Visit | Attending: General Surgery | Admitting: General Surgery

## 2023-09-19 DIAGNOSIS — N6489 Other specified disorders of breast: Secondary | ICD-10-CM | POA: Diagnosis not present

## 2023-09-19 DIAGNOSIS — Z9889 Other specified postprocedural states: Secondary | ICD-10-CM

## 2023-10-11 DIAGNOSIS — D485 Neoplasm of uncertain behavior of skin: Secondary | ICD-10-CM | POA: Diagnosis not present

## 2023-10-11 DIAGNOSIS — L820 Inflamed seborrheic keratosis: Secondary | ICD-10-CM | POA: Diagnosis not present

## 2023-10-11 DIAGNOSIS — L821 Other seborrheic keratosis: Secondary | ICD-10-CM | POA: Diagnosis not present

## 2023-10-11 DIAGNOSIS — D1801 Hemangioma of skin and subcutaneous tissue: Secondary | ICD-10-CM | POA: Diagnosis not present

## 2023-10-11 DIAGNOSIS — L814 Other melanin hyperpigmentation: Secondary | ICD-10-CM | POA: Diagnosis not present

## 2023-10-11 DIAGNOSIS — L570 Actinic keratosis: Secondary | ICD-10-CM | POA: Diagnosis not present

## 2023-10-11 DIAGNOSIS — Z85828 Personal history of other malignant neoplasm of skin: Secondary | ICD-10-CM | POA: Diagnosis not present

## 2023-10-24 DIAGNOSIS — H353211 Exudative age-related macular degeneration, right eye, with active choroidal neovascularization: Secondary | ICD-10-CM | POA: Diagnosis not present

## 2023-10-24 DIAGNOSIS — H43813 Vitreous degeneration, bilateral: Secondary | ICD-10-CM | POA: Diagnosis not present

## 2023-10-24 DIAGNOSIS — H353124 Nonexudative age-related macular degeneration, left eye, advanced atrophic with subfoveal involvement: Secondary | ICD-10-CM | POA: Diagnosis not present

## 2023-12-05 DIAGNOSIS — H353124 Nonexudative age-related macular degeneration, left eye, advanced atrophic with subfoveal involvement: Secondary | ICD-10-CM | POA: Diagnosis not present

## 2023-12-05 DIAGNOSIS — H353211 Exudative age-related macular degeneration, right eye, with active choroidal neovascularization: Secondary | ICD-10-CM | POA: Diagnosis not present

## 2023-12-05 DIAGNOSIS — H43813 Vitreous degeneration, bilateral: Secondary | ICD-10-CM | POA: Diagnosis not present

## 2024-01-16 DIAGNOSIS — H353211 Exudative age-related macular degeneration, right eye, with active choroidal neovascularization: Secondary | ICD-10-CM | POA: Diagnosis not present

## 2024-01-16 DIAGNOSIS — H353124 Nonexudative age-related macular degeneration, left eye, advanced atrophic with subfoveal involvement: Secondary | ICD-10-CM | POA: Diagnosis not present

## 2024-01-16 DIAGNOSIS — H43813 Vitreous degeneration, bilateral: Secondary | ICD-10-CM | POA: Diagnosis not present

## 2024-02-13 DIAGNOSIS — H353221 Exudative age-related macular degeneration, left eye, with active choroidal neovascularization: Secondary | ICD-10-CM | POA: Diagnosis not present

## 2024-02-18 DIAGNOSIS — K219 Gastro-esophageal reflux disease without esophagitis: Secondary | ICD-10-CM | POA: Diagnosis not present

## 2024-02-18 DIAGNOSIS — Z6826 Body mass index (BMI) 26.0-26.9, adult: Secondary | ICD-10-CM | POA: Diagnosis not present

## 2024-02-18 DIAGNOSIS — J432 Centrilobular emphysema: Secondary | ICD-10-CM | POA: Diagnosis not present

## 2024-02-18 DIAGNOSIS — R1319 Other dysphagia: Secondary | ICD-10-CM | POA: Diagnosis not present

## 2024-02-18 DIAGNOSIS — R053 Chronic cough: Secondary | ICD-10-CM | POA: Diagnosis not present

## 2024-03-12 DIAGNOSIS — H353231 Exudative age-related macular degeneration, bilateral, with active choroidal neovascularization: Secondary | ICD-10-CM | POA: Diagnosis not present

## 2024-03-12 DIAGNOSIS — H353124 Nonexudative age-related macular degeneration, left eye, advanced atrophic with subfoveal involvement: Secondary | ICD-10-CM | POA: Diagnosis not present

## 2024-03-12 DIAGNOSIS — H43813 Vitreous degeneration, bilateral: Secondary | ICD-10-CM | POA: Diagnosis not present

## 2024-03-20 DIAGNOSIS — Z6826 Body mass index (BMI) 26.0-26.9, adult: Secondary | ICD-10-CM | POA: Diagnosis not present

## 2024-03-20 DIAGNOSIS — K5909 Other constipation: Secondary | ICD-10-CM | POA: Diagnosis not present

## 2024-04-03 DIAGNOSIS — F410 Panic disorder [episodic paroxysmal anxiety] without agoraphobia: Secondary | ICD-10-CM | POA: Diagnosis not present

## 2024-04-03 DIAGNOSIS — F331 Major depressive disorder, recurrent, moderate: Secondary | ICD-10-CM | POA: Diagnosis not present

## 2024-04-15 DIAGNOSIS — J432 Centrilobular emphysema: Secondary | ICD-10-CM | POA: Diagnosis not present

## 2024-04-15 DIAGNOSIS — K219 Gastro-esophageal reflux disease without esophagitis: Secondary | ICD-10-CM | POA: Diagnosis not present

## 2024-04-15 DIAGNOSIS — K5901 Slow transit constipation: Secondary | ICD-10-CM | POA: Diagnosis not present

## 2024-04-15 DIAGNOSIS — R1312 Dysphagia, oropharyngeal phase: Secondary | ICD-10-CM | POA: Diagnosis not present

## 2024-04-15 DIAGNOSIS — Z6826 Body mass index (BMI) 26.0-26.9, adult: Secondary | ICD-10-CM | POA: Diagnosis not present

## 2024-04-23 DIAGNOSIS — J449 Chronic obstructive pulmonary disease, unspecified: Secondary | ICD-10-CM | POA: Diagnosis not present

## 2024-04-23 DIAGNOSIS — G4733 Obstructive sleep apnea (adult) (pediatric): Secondary | ICD-10-CM | POA: Diagnosis not present

## 2024-04-23 DIAGNOSIS — E559 Vitamin D deficiency, unspecified: Secondary | ICD-10-CM | POA: Diagnosis not present

## 2024-04-23 DIAGNOSIS — F331 Major depressive disorder, recurrent, moderate: Secondary | ICD-10-CM | POA: Diagnosis not present

## 2024-04-23 DIAGNOSIS — K219 Gastro-esophageal reflux disease without esophagitis: Secondary | ICD-10-CM | POA: Diagnosis not present

## 2024-04-23 DIAGNOSIS — J432 Centrilobular emphysema: Secondary | ICD-10-CM | POA: Diagnosis not present

## 2024-04-23 DIAGNOSIS — Z6826 Body mass index (BMI) 26.0-26.9, adult: Secondary | ICD-10-CM | POA: Diagnosis not present

## 2024-04-23 DIAGNOSIS — Z0000 Encounter for general adult medical examination without abnormal findings: Secondary | ICD-10-CM | POA: Diagnosis not present

## 2024-04-23 DIAGNOSIS — E782 Mixed hyperlipidemia: Secondary | ICD-10-CM | POA: Diagnosis not present

## 2024-04-23 DIAGNOSIS — I739 Peripheral vascular disease, unspecified: Secondary | ICD-10-CM | POA: Diagnosis not present

## 2024-05-04 NOTE — Assessment & Plan Note (Signed)
Stage IA, P(T1cNxM0), ER+/PR+/HER2-, Grade II -Diagnosed in 08/2020. S/p right lumpectomy with Dr Carolynne Edouard on 09/30/20, pathology showed: 1.3cm invasive cancer, G2, with low grade DCIS; positive anterior margins with DCIS.   -s/p Radiation with Dr Basilio Cairo 11/07/20 - 12/06/20.  -she started Tamoxifen 20mg  in 12/2020. Tolerated well until ~11/2021 when she had worsening depression/anxiety, weight loss, and nausea. On hold since 05/2022 -most recent mammogram 08/28/22 was benign. -Clinically doing well from a breast cancer standpoint.  We will continue surveillance.  Given her advanced age, and poor tolerance to tamoxifen previously, I do not plan to restart her on tamoxifen or try AI.

## 2024-05-05 ENCOUNTER — Encounter: Payer: Self-pay | Admitting: Hematology

## 2024-05-05 ENCOUNTER — Inpatient Hospital Stay: Payer: Medicare PPO | Attending: Hematology

## 2024-05-05 ENCOUNTER — Inpatient Hospital Stay: Payer: Medicare PPO | Admitting: Hematology

## 2024-05-05 VITALS — BP 140/70 | HR 64 | Temp 97.8°F | Resp 21 | Wt 148.4 lb

## 2024-05-05 DIAGNOSIS — C50211 Malignant neoplasm of upper-inner quadrant of right female breast: Secondary | ICD-10-CM | POA: Insufficient documentation

## 2024-05-05 DIAGNOSIS — Z923 Personal history of irradiation: Secondary | ICD-10-CM | POA: Diagnosis not present

## 2024-05-05 DIAGNOSIS — Z170 Estrogen receptor positive status [ER+]: Secondary | ICD-10-CM | POA: Diagnosis not present

## 2024-05-05 DIAGNOSIS — Z853 Personal history of malignant neoplasm of breast: Secondary | ICD-10-CM | POA: Insufficient documentation

## 2024-05-05 DIAGNOSIS — Z87891 Personal history of nicotine dependence: Secondary | ICD-10-CM | POA: Insufficient documentation

## 2024-05-05 DIAGNOSIS — Z1231 Encounter for screening mammogram for malignant neoplasm of breast: Secondary | ICD-10-CM

## 2024-05-05 LAB — CBC WITH DIFFERENTIAL (CANCER CENTER ONLY)
Abs Immature Granulocytes: 0.02 10*3/uL (ref 0.00–0.07)
Basophils Absolute: 0 10*3/uL (ref 0.0–0.1)
Basophils Relative: 0 %
Eosinophils Absolute: 0.2 10*3/uL (ref 0.0–0.5)
Eosinophils Relative: 3 %
HCT: 39.2 % (ref 36.0–46.0)
Hemoglobin: 13.2 g/dL (ref 12.0–15.0)
Immature Granulocytes: 0 %
Lymphocytes Relative: 14 %
Lymphs Abs: 1.1 10*3/uL (ref 0.7–4.0)
MCH: 31.1 pg (ref 26.0–34.0)
MCHC: 33.7 g/dL (ref 30.0–36.0)
MCV: 92.5 fL (ref 80.0–100.0)
Monocytes Absolute: 0.7 10*3/uL (ref 0.1–1.0)
Monocytes Relative: 10 %
Neutro Abs: 5.5 10*3/uL (ref 1.7–7.7)
Neutrophils Relative %: 73 %
Platelet Count: 150 10*3/uL (ref 150–400)
RBC: 4.24 MIL/uL (ref 3.87–5.11)
RDW: 13 % (ref 11.5–15.5)
WBC Count: 7.5 10*3/uL (ref 4.0–10.5)
nRBC: 0 % (ref 0.0–0.2)

## 2024-05-05 LAB — CMP (CANCER CENTER ONLY)
ALT: 11 U/L (ref 0–44)
AST: 15 U/L (ref 15–41)
Albumin: 4.1 g/dL (ref 3.5–5.0)
Alkaline Phosphatase: 69 U/L (ref 38–126)
Anion gap: 3 — ABNORMAL LOW (ref 5–15)
BUN: 13 mg/dL (ref 8–23)
CO2: 32 mmol/L (ref 22–32)
Calcium: 9.2 mg/dL (ref 8.9–10.3)
Chloride: 104 mmol/L (ref 98–111)
Creatinine: 0.74 mg/dL (ref 0.44–1.00)
GFR, Estimated: >60 mL/min (ref >60)
Glucose, Bld: 83 mg/dL (ref 70–99)
Potassium: 4.2 mmol/L (ref 3.5–5.1)
Sodium: 139 mmol/L (ref 135–145)
Total Bilirubin: 0.5 mg/dL (ref 0.0–1.2)
Total Protein: 6.5 g/dL (ref 6.5–8.1)

## 2024-05-05 NOTE — Progress Notes (Signed)
 [facility]   Telephone:(336) [phone] Fax:(336) [phone]   Clinic Follow up Note   Patient Care Team: Helyn Lobstein, MD as PCP - General (Family Medicine) Sheryle Donning, MD as PCP - Cardiology (Cardiology) Auther Bo, RN as Oncology Nurse Navigator Alane Hsu, RN as Oncology Nurse Navigator Caralyn Chandler, MD as Consulting Physician (General Surgery) Sonja St. Johns, MD as Consulting Physician (Hematology) Colie Dawes, MD as Attending Physician (Radiation Oncology) Burton, Lacie K, NP as Nurse Practitioner (Nurse Practitioner)  Date of Service:  05/05/2024  CHIEF COMPLAINT: f/u of right breast cancer  CURRENT THERAPY:  Cancer surveillance  Oncology History   Malignant neoplasm of upper-inner quadrant of right breast in female, estrogen receptor positive (HCC) Stage IA, P(T1cNxM0), ER+/PR+/HER2-, Grade II -Diagnosed in 08/2020. S/p right lumpectomy with Dr Alethea Andes on 09/30/20, pathology showed: 1.3cm invasive cancer, G2, with low grade DCIS; positive anterior margins with DCIS.   -s/p Radiation with Dr Lurena Sally 11/07/20 - 12/06/20.  -she started Tamoxifen  20mg  in 12/2020. Tolerated well until ~11/2021 when she had worsening depression/anxiety, weight loss, and nausea. On hold since 05/2022 -most recent mammogram 08/28/22 was benign. -Clinically doing well from a breast cancer standpoint.  We will continue surveillance.  Given her advanced age, and poor tolerance to tamoxifen  previously, I do not plan to restart her on tamoxifen  or try AI.  Assessment & Plan Breast cancer Breast cancer with previous surgery on the left breast. Reports sensation of a cyst enlarging without pain. Physical exam reveals tenderness and lumpiness on the left lateral breast. No changes in the right breast. Previous mammogram in October last year showed dense breast tissue, complicating detection of changes. Annual mammogram recommended. Prefers diagnostic mammogram, but insurance may  not cover it after two years; screening mammogram planned instead. - Order screening mammogram in October 2025 - Advise to report any new breast pain or changes persisting for a couple of months  Gastroesophageal reflux disease (GERD) Managed with pantoprazole. No current symptoms or issues reported.  Depression Managed with Lexapro . No current symptoms or issues reported.  Hyperlipidemia Managed with atorvastatin . No current symptoms or issues reported.  Plan - She is clinically doing well, continue cancer surveillance - Lab and follow-up in 1 year, or sooner if she has any clinical concerns.     SUMMARY OF ONCOLOGIC HISTORY: Oncology History Overview Note  Cancer Staging Malignant neoplasm of upper-inner quadrant of right breast in female, estrogen receptor positive (HCC) Staging form: Breast, AJCC 8th Edition - Clinical stage from 08/31/2020: Stage IA (cT1b, cN0, cM0, G2, ER+, PR+, HER2-) - Signed by Sonja Oneonta, MD on 09/07/2020    Malignant neoplasm of upper-inner quadrant of right breast in female, estrogen receptor positive (HCC)  08/02/2020 Mammogram   IMPRESSION: Interval increase in size of an asymmetry in the slightly medial right breast. Patient was unable to tolerate an mL view today due to a rash underneath her breast. There is a possible subtle sonographic correlate at 4:30 o'clock 6 cm from the nipple. This measures 0.9 x 0.7 x 0.6 cm.   08/31/2020 Cancer Staging   Staging form: Breast, AJCC 8th Edition - Clinical stage from 08/31/2020: Stage IA (cT1b, cN0, cM0, G2, ER+, PR+, HER2-) - Signed by Sonja Mendota, MD on 09/07/2020   08/31/2020 Initial Biopsy   Diagnosis Breast, right, needle core biopsy, LIQ, coil clip - INVASIVE DUCTAL CARCINOMA - SEE COMMENT Microscopic Comment Based on the biopsy, the carcinoma appears Nottingham grade 2 of 3, has foci of  extracellular mucin and measures 1 cm in greatest linear extent. Prognostic markers (ER/PR/ki-67/HER2) are pending  and will be reported in an addendum. Dr. Judd Northern reviewed the case and agrees with the above diagnosis. These results were called to [facility] on September 01, 2020.   08/31/2020 Receptors her2   ADDITIONAL INFORMATION: Her2 is NEGATIVE (0).   Results: IMMUNOHISTOCHEMICAL AND MORPHOMETRIC ANALYSIS PERFORMED MANUALLY Estrogen Receptor: 95%, POSITIVE, STRONG STAINING INTENSITY Progesterone Receptor: 95%, POSITIVE, STRONG STAINING INTENSITY Proliferation Marker Ki67: 10%   09/03/2020 Initial Diagnosis   Malignant neoplasm of upper-inner quadrant of right breast in female, estrogen receptor positive (HCC)   09/30/2020 Surgery   RIGHT BREAST LUMPECTOMY WITH RADIOACTIVE SEED LOCALIZATION by Dr Alethea Andes   09/30/2020 Pathology Results   FINAL MICROSCOPIC DIAGNOSIS:   A. BREAST, RIGHT, LUMPECTOMY:  - Invasive ductal carcinoma with extracellular mucin, 1.3 cm, grade 2  - Ductal carcinoma in situ low-grade  - Resection margins are negative for invasive carcinoma; closest  anterior margin at less than 1 mm and lateral margin at 2 mm  - DCIS focally involves the anterior margin and is 1 mm from the lateral  margin  - Negative for lymphovascular perineural invasion  - Biopsy site changes  - See oncology table    09/30/2020 Cancer Staging   Staging form: Breast, AJCC 8th Edition - Pathologic stage from 09/30/2020: Stage Unknown (pT1c, pNX, cM0, G2, ER+, PR+, HER2-) - Signed by Sonja Wynot, MD on 01/13/2021   11/07/2020 - 12/06/2020 Radiation Therapy   Adjuvant Radiation with Dr Lurena Sally    12/2020 -  Anti-estrogen oral therapy   Tamoxifen  20mg  once daily starting in 12/2020.    03/03/2021 Survivorship   SCP delivered virtually by Lacie Burton, NP       Discussed the use of AI scribe software for clinical note transcription with the patient, who gave verbal consent to proceed.  History of Present Illness [name] is an 89 year old female with breast cancer who  presents for follow-up.  She experiences no breast pain or discomfort but notes tenderness on the left side where a lump is felt. A history of a breast cyst is present, with a perceived increase in size. A mammogram was performed last October, with another planned for this October. She underwent breast surgery on the left side in the past, with a benign mass removed after hormone shots.  Her medication regimen includes pantoprazole, Lexapro , albuterol , Spiriva, and atorvastatin . She is not using Xanax  or Prilosec. She lives independently, uses a cane, and has had no recent falls. She experiences difficulty breathing, attributed to a history of smoking.     All other systems were reviewed with the patient and are negative.  MEDICAL HISTORY:  Past Medical History:  Diagnosis Date   Anxiety    Back pain    Cancer (HCC)    Depression    Emphysema    Esophageal spasm    Lung nodule    OSA (obstructive sleep apnea)    Osteomalacia    Personal history of radiation therapy    Vertigo    Vitamin D deficiency     SURGICAL HISTORY: Past Surgical History:  Procedure Laterality Date   ABDOMINAL HYSTERECTOMY     APPENDECTOMY     BREAST BIOPSY Right 08/31/2020   BREAST EXCISIONAL BIOPSY Left 1978   BREAST LUMPECTOMY Right 09/30/2020   BREAST LUMPECTOMY WITH RADIOACTIVE SEED LOCALIZATION Right 09/30/2020   Procedure: RIGHT BREAST LUMPECTOMY WITH RADIOACTIVE SEED  LOCALIZATION;  Surgeon: Caralyn Chandler, MD;  Location: [facility];  Service: General;  Laterality: Right;   CARPAL TUNNEL RELEASE     ROTATOR CUFF REPAIR      I have reviewed the social history and family history with the patient and they are unchanged from previous note.  ALLERGIES:  is allergic to adhesive [tape] and latex.  MEDICATIONS:  Current Outpatient Medications  Medication Sig Dispense Refill   acetaminophen  (TYLENOL ) 650 MG CR tablet Take 650 mg by mouth 2 (two) times daily as needed for pain.       albuterol  (PROVENTIL  HFA;VENTOLIN  HFA) 108 (90 Base) MCG/ACT inhaler Inhale 2 puffs into the lungs every 6 (six) hours as needed for wheezing or shortness of breath.     aspirin  EC 81 MG tablet Take 81 mg by mouth daily.     atorvastatin  (LIPITOR) 10 MG tablet Take 10 mg by mouth at bedtime.     cariprazine (VRAYLAR) 3 MG capsule 1 capsule     Cholecalciferol (VITAMIN D) 2000 units tablet Take 4,000 Units by mouth daily.      escitalopram  (LEXAPRO ) 20 MG tablet Take 40 mg by mouth daily.  2   fluticasone (FLONASE) 50 MCG/ACT nasal spray Place 2 sprays into both nostrils daily.     Multiple Vitamin (MULTIVITAMIN WITH MINERALS) TABS tablet Take 1 tablet by mouth daily.     pantoprazole (PROTONIX) 20 MG tablet Take 20 mg by mouth daily.     Polyethyl Glycol-Propyl Glycol (SYSTANE) 0.4-0.3 % SOLN Place 1 drop into both eyes daily as needed (dry eyes).     tiotropium (SPIRIVA) 18 MCG inhalation capsule Place 18 mcg into inhaler and inhale daily.     No current facility-administered medications for this visit.    PHYSICAL EXAMINATION: ECOG PERFORMANCE STATUS: 1 - Symptomatic but completely ambulatory  Vitals:   05/05/24 1401  BP: (!) 140/70  Pulse: 64  Resp: (!) 21  Temp: 97.8 F (36.6 C)  SpO2: 94%   Wt Readings from Last 3 Encounters:  05/05/24 148 lb 6.4 oz (67.3 kg)  09/03/23 142 lb 4.8 oz (64.5 kg)  09/01/22 130 lb 3.2 oz (59.1 kg)     GENERAL:alert, no distress and comfortable SKIN: skin color, texture, turgor are normal, no rashes or significant lesions EYES: normal, Conjunctiva are pink and non-injected, sclera clear NECK: supple, thyroid  normal size, non-tender, without nodularity LYMPH:  no palpable lymphadenopathy in the cervical, axillary  LUNGS: clear to auscultation and percussion with normal breathing effort HEART: regular rate & rhythm and no murmurs and no lower extremity edema ABDOMEN:abdomen soft, non-tender and normal bowel sounds Musculoskeletal:no cyanosis of  digits and no clubbing  NEURO: alert & oriented x 3 with fluent speech, no focal motor/sensory deficits BREAST: both breast, especially lateral, lumpy and tender. No scar tissue felt on left breast. Physical Exam   LABORATORY DATA:  I have reviewed the data as listed    Latest Ref Rng & Units 05/05/2024    1:43 PM 09/03/2023    1:52 PM 09/01/2022    2:22 PM  CBC  WBC 4.0 - 10.5 K/uL 7.5  6.4  5.1   Hemoglobin 12.0 - 15.0 g/dL 16.1  09.6  04.5   Hematocrit 36.0 - 46.0 % 39.2  40.3  39.7   Platelets 150 - 400 K/uL 150  153  166         Latest Ref Rng & Units 05/05/2024    1:43 PM 09/03/2023  1:52 PM 09/01/2022    2:22 PM  CMP  Glucose 70 - 99 mg/dL 83  89  782   BUN 8 - 23 mg/dL 13  15  16    Creatinine 0.44 - 1.00 mg/dL 9.56  2.13  0.86   Sodium 135 - 145 mmol/L 139  138  141   Potassium 3.5 - 5.1 mmol/L 4.2  4.3  3.9   Chloride 98 - 111 mmol/L 104  103  106   CO2 22 - 32 mmol/L 32  31  30   Calcium  8.9 - 10.3 mg/dL 9.2  9.4  9.1   Total Protein 6.5 - 8.1 g/dL 6.5  6.6  6.2   Total Bilirubin 0.0 - 1.2 mg/dL 0.5  0.5  0.6   Alkaline Phos 38 - 126 U/L 69  75  49   AST 15 - 41 U/L 15  17  17    ALT 0 - 44 U/L 11  12  11        RADIOGRAPHIC STUDIES: I have personally reviewed the radiological images as listed and agreed with the findings in the report. No results found.    Orders Placed This Encounter  Procedures   MM Digital Screening    Standing Status:   Future    Expected Date:   10/06/2024    Expiration Date:   05/05/2025    Reason for Exam (SYMPTOM  OR DIAGNOSIS REQUIRED):   screening    Preferred [facility] location[facility]   All questions were answered. The patient knows to call the clinic with any problems, questions or concerns. No barriers to learning was detected. The total time spent in the appointment was 20 minutes, including review of chart and various tests results, discussions about plan of care and coordination of care plan     Sonja Guffey,  MD 05/05/2024

## 2024-05-07 DIAGNOSIS — H43813 Vitreous degeneration, bilateral: Secondary | ICD-10-CM | POA: Diagnosis not present

## 2024-05-07 DIAGNOSIS — H353231 Exudative age-related macular degeneration, bilateral, with active choroidal neovascularization: Secondary | ICD-10-CM | POA: Diagnosis not present

## 2024-05-07 DIAGNOSIS — H353124 Nonexudative age-related macular degeneration, left eye, advanced atrophic with subfoveal involvement: Secondary | ICD-10-CM | POA: Diagnosis not present

## 2024-06-19 ENCOUNTER — Encounter (HOSPITAL_COMMUNITY): Payer: Self-pay

## 2024-06-19 ENCOUNTER — Other Ambulatory Visit: Payer: Self-pay

## 2024-06-19 ENCOUNTER — Emergency Department (HOSPITAL_COMMUNITY)
Admission: EM | Admit: 2024-06-19 | Discharge: 2024-06-20 | Discharge status: Against Medical Advice | Attending: Emergency Medicine | Admitting: Emergency Medicine

## 2024-06-19 DIAGNOSIS — I69392 Facial weakness following cerebral infarction: Secondary | ICD-10-CM | POA: Diagnosis not present

## 2024-06-19 DIAGNOSIS — R109 Unspecified abdominal pain: Secondary | ICD-10-CM | POA: Diagnosis not present

## 2024-06-19 DIAGNOSIS — Z4682 Encounter for fitting and adjustment of non-vascular catheter: Secondary | ICD-10-CM | POA: Diagnosis not present

## 2024-06-19 DIAGNOSIS — R918 Other nonspecific abnormal finding of lung field: Secondary | ICD-10-CM | POA: Diagnosis not present

## 2024-06-19 DIAGNOSIS — E871 Hypo-osmolality and hyponatremia: Secondary | ICD-10-CM | POA: Diagnosis not present

## 2024-06-19 DIAGNOSIS — R112 Nausea with vomiting, unspecified: Secondary | ICD-10-CM | POA: Diagnosis present

## 2024-06-19 DIAGNOSIS — R29715 NIHSS score 15: Secondary | ICD-10-CM | POA: Diagnosis not present

## 2024-06-19 DIAGNOSIS — K5792 Diverticulitis of intestine, part unspecified, without perforation or abscess without bleeding: Secondary | ICD-10-CM | POA: Diagnosis not present

## 2024-06-19 DIAGNOSIS — R0902 Hypoxemia: Secondary | ICD-10-CM | POA: Diagnosis not present

## 2024-06-19 DIAGNOSIS — R509 Fever, unspecified: Secondary | ICD-10-CM | POA: Diagnosis not present

## 2024-06-19 DIAGNOSIS — E785 Hyperlipidemia, unspecified: Secondary | ICD-10-CM | POA: Diagnosis present

## 2024-06-19 DIAGNOSIS — R059 Cough, unspecified: Secondary | ICD-10-CM | POA: Diagnosis not present

## 2024-06-19 DIAGNOSIS — S2242XA Multiple fractures of ribs, left side, initial encounter for closed fracture: Secondary | ICD-10-CM | POA: Diagnosis not present

## 2024-06-19 DIAGNOSIS — K029 Dental caries, unspecified: Secondary | ICD-10-CM | POA: Diagnosis not present

## 2024-06-19 DIAGNOSIS — R29713 NIHSS score 13: Secondary | ICD-10-CM | POA: Diagnosis not present

## 2024-06-19 DIAGNOSIS — D696 Thrombocytopenia, unspecified: Secondary | ICD-10-CM | POA: Diagnosis not present

## 2024-06-19 DIAGNOSIS — I639 Cerebral infarction, unspecified: Secondary | ICD-10-CM | POA: Diagnosis not present

## 2024-06-19 DIAGNOSIS — I110 Hypertensive heart disease with heart failure: Secondary | ICD-10-CM | POA: Diagnosis present

## 2024-06-19 DIAGNOSIS — I771 Stricture of artery: Secondary | ICD-10-CM | POA: Diagnosis not present

## 2024-06-19 DIAGNOSIS — D649 Anemia, unspecified: Secondary | ICD-10-CM | POA: Diagnosis present

## 2024-06-19 DIAGNOSIS — R1010 Upper abdominal pain, unspecified: Secondary | ICD-10-CM | POA: Diagnosis not present

## 2024-06-19 DIAGNOSIS — A419 Sepsis, unspecified organism: Secondary | ICD-10-CM | POA: Diagnosis not present

## 2024-06-19 DIAGNOSIS — K819 Cholecystitis, unspecified: Secondary | ICD-10-CM | POA: Diagnosis not present

## 2024-06-19 DIAGNOSIS — I10 Essential (primary) hypertension: Secondary | ICD-10-CM | POA: Diagnosis not present

## 2024-06-19 DIAGNOSIS — I779 Disorder of arteries and arterioles, unspecified: Secondary | ICD-10-CM | POA: Diagnosis not present

## 2024-06-19 DIAGNOSIS — R932 Abnormal findings on diagnostic imaging of liver and biliary tract: Secondary | ICD-10-CM | POA: Diagnosis not present

## 2024-06-19 DIAGNOSIS — R7989 Other specified abnormal findings of blood chemistry: Secondary | ICD-10-CM | POA: Diagnosis not present

## 2024-06-19 DIAGNOSIS — B9620 Unspecified Escherichia coli [E. coli] as the cause of diseases classified elsewhere: Secondary | ICD-10-CM | POA: Diagnosis not present

## 2024-06-19 DIAGNOSIS — R5381 Other malaise: Secondary | ICD-10-CM | POA: Insufficient documentation

## 2024-06-19 DIAGNOSIS — E876 Hypokalemia: Secondary | ICD-10-CM | POA: Diagnosis not present

## 2024-06-19 DIAGNOSIS — A4151 Sepsis due to Escherichia coli [E. coli]: Secondary | ICD-10-CM | POA: Diagnosis present

## 2024-06-19 DIAGNOSIS — K5901 Slow transit constipation: Secondary | ICD-10-CM | POA: Diagnosis not present

## 2024-06-19 DIAGNOSIS — I2510 Atherosclerotic heart disease of native coronary artery without angina pectoris: Secondary | ICD-10-CM | POA: Diagnosis present

## 2024-06-19 DIAGNOSIS — F05 Delirium due to known physiological condition: Secondary | ICD-10-CM | POA: Diagnosis not present

## 2024-06-19 DIAGNOSIS — J9601 Acute respiratory failure with hypoxia: Secondary | ICD-10-CM | POA: Diagnosis not present

## 2024-06-19 DIAGNOSIS — E8809 Other disorders of plasma-protein metabolism, not elsewhere classified: Secondary | ICD-10-CM | POA: Diagnosis present

## 2024-06-19 DIAGNOSIS — R414 Neurologic neglect syndrome: Secondary | ICD-10-CM | POA: Diagnosis not present

## 2024-06-19 DIAGNOSIS — G8114 Spastic hemiplegia affecting left nondominant side: Secondary | ICD-10-CM | POA: Diagnosis not present

## 2024-06-19 DIAGNOSIS — G4733 Obstructive sleep apnea (adult) (pediatric): Secondary | ICD-10-CM | POA: Diagnosis not present

## 2024-06-19 DIAGNOSIS — R443 Hallucinations, unspecified: Secondary | ICD-10-CM | POA: Diagnosis not present

## 2024-06-19 DIAGNOSIS — I5031 Acute diastolic (congestive) heart failure: Secondary | ICD-10-CM | POA: Diagnosis not present

## 2024-06-19 DIAGNOSIS — K7689 Other specified diseases of liver: Secondary | ICD-10-CM | POA: Diagnosis not present

## 2024-06-19 DIAGNOSIS — R945 Abnormal results of liver function studies: Secondary | ICD-10-CM | POA: Diagnosis not present

## 2024-06-19 DIAGNOSIS — I69391 Dysphagia following cerebral infarction: Secondary | ICD-10-CM | POA: Diagnosis not present

## 2024-06-19 DIAGNOSIS — Z87891 Personal history of nicotine dependence: Secondary | ICD-10-CM | POA: Diagnosis not present

## 2024-06-19 DIAGNOSIS — F32A Depression, unspecified: Secondary | ICD-10-CM | POA: Diagnosis present

## 2024-06-19 DIAGNOSIS — R29818 Other symptoms and signs involving the nervous system: Secondary | ICD-10-CM | POA: Diagnosis not present

## 2024-06-19 DIAGNOSIS — R9431 Abnormal electrocardiogram [ECG] [EKG]: Secondary | ICD-10-CM | POA: Diagnosis not present

## 2024-06-19 DIAGNOSIS — R8271 Bacteriuria: Secondary | ICD-10-CM | POA: Diagnosis not present

## 2024-06-19 DIAGNOSIS — R1011 Right upper quadrant pain: Secondary | ICD-10-CM | POA: Diagnosis not present

## 2024-06-19 DIAGNOSIS — R1084 Generalized abdominal pain: Secondary | ICD-10-CM | POA: Insufficient documentation

## 2024-06-19 DIAGNOSIS — I739 Peripheral vascular disease, unspecified: Secondary | ICD-10-CM | POA: Diagnosis present

## 2024-06-19 DIAGNOSIS — I700 Atherosclerosis of aorta: Secondary | ICD-10-CM | POA: Diagnosis not present

## 2024-06-19 DIAGNOSIS — R7881 Bacteremia: Secondary | ICD-10-CM | POA: Diagnosis not present

## 2024-06-19 DIAGNOSIS — E440 Moderate protein-calorie malnutrition: Secondary | ICD-10-CM | POA: Diagnosis not present

## 2024-06-19 DIAGNOSIS — H53462 Homonymous bilateral field defects, left side: Secondary | ICD-10-CM | POA: Diagnosis not present

## 2024-06-19 DIAGNOSIS — I63511 Cerebral infarction due to unspecified occlusion or stenosis of right middle cerebral artery: Secondary | ICD-10-CM | POA: Diagnosis not present

## 2024-06-19 DIAGNOSIS — J439 Emphysema, unspecified: Secondary | ICD-10-CM | POA: Diagnosis present

## 2024-06-19 DIAGNOSIS — K769 Liver disease, unspecified: Secondary | ICD-10-CM | POA: Diagnosis not present

## 2024-06-19 DIAGNOSIS — K5730 Diverticulosis of large intestine without perforation or abscess without bleeding: Secondary | ICD-10-CM | POA: Diagnosis not present

## 2024-06-19 DIAGNOSIS — R531 Weakness: Secondary | ICD-10-CM | POA: Diagnosis not present

## 2024-06-19 DIAGNOSIS — Z5321 Procedure and treatment not carried out due to patient leaving prior to being seen by health care provider: Secondary | ICD-10-CM | POA: Insufficient documentation

## 2024-06-19 DIAGNOSIS — I6782 Cerebral ischemia: Secondary | ICD-10-CM | POA: Diagnosis not present

## 2024-06-19 DIAGNOSIS — G8194 Hemiplegia, unspecified affecting left nondominant side: Secondary | ICD-10-CM | POA: Diagnosis not present

## 2024-06-19 DIAGNOSIS — R1312 Dysphagia, oropharyngeal phase: Secondary | ICD-10-CM | POA: Diagnosis not present

## 2024-06-19 DIAGNOSIS — I618 Other nontraumatic intracerebral hemorrhage: Secondary | ICD-10-CM | POA: Diagnosis not present

## 2024-06-19 DIAGNOSIS — H05232 Hemorrhage of left orbit: Secondary | ICD-10-CM | POA: Diagnosis not present

## 2024-06-19 DIAGNOSIS — E46 Unspecified protein-calorie malnutrition: Secondary | ICD-10-CM | POA: Diagnosis present

## 2024-06-19 DIAGNOSIS — K828 Other specified diseases of gallbladder: Secondary | ICD-10-CM | POA: Diagnosis not present

## 2024-06-19 DIAGNOSIS — I69354 Hemiplegia and hemiparesis following cerebral infarction affecting left non-dominant side: Secondary | ICD-10-CM | POA: Diagnosis not present

## 2024-06-19 DIAGNOSIS — I5033 Acute on chronic diastolic (congestive) heart failure: Secondary | ICD-10-CM | POA: Diagnosis not present

## 2024-06-19 DIAGNOSIS — I69322 Dysarthria following cerebral infarction: Secondary | ICD-10-CM | POA: Diagnosis not present

## 2024-06-19 DIAGNOSIS — I6601 Occlusion and stenosis of right middle cerebral artery: Secondary | ICD-10-CM | POA: Diagnosis not present

## 2024-06-19 DIAGNOSIS — K8689 Other specified diseases of pancreas: Secondary | ICD-10-CM | POA: Diagnosis not present

## 2024-06-19 DIAGNOSIS — K810 Acute cholecystitis: Secondary | ICD-10-CM | POA: Diagnosis present

## 2024-06-19 DIAGNOSIS — I609 Nontraumatic subarachnoid hemorrhage, unspecified: Secondary | ICD-10-CM | POA: Diagnosis not present

## 2024-06-19 DIAGNOSIS — G928 Other toxic encephalopathy: Secondary | ICD-10-CM | POA: Diagnosis not present

## 2024-06-19 DIAGNOSIS — J449 Chronic obstructive pulmonary disease, unspecified: Secondary | ICD-10-CM | POA: Diagnosis not present

## 2024-06-19 LAB — COMPREHENSIVE METABOLIC PANEL WITH GFR
ALT: 958 U/L — ABNORMAL HIGH (ref 0–44)
AST: 1658 U/L — ABNORMAL HIGH (ref 15–41)
Albumin: 3.8 g/dL (ref 3.5–5.0)
Alkaline Phosphatase: 117 U/L (ref 38–126)
Anion gap: 15 (ref 5–15)
BUN: 15 mg/dL (ref 8–23)
CO2: 21 mmol/L — ABNORMAL LOW (ref 22–32)
Calcium: 8.7 mg/dL — ABNORMAL LOW (ref 8.9–10.3)
Chloride: 102 mmol/L (ref 98–111)
Creatinine, Ser: 0.87 mg/dL (ref 0.44–1.00)
GFR, Estimated: >60 mL/min (ref >60)
Glucose, Bld: 126 mg/dL — ABNORMAL HIGH (ref 70–99)
Potassium: 3.5 mmol/L (ref 3.5–5.1)
Sodium: 138 mmol/L (ref 135–145)
Total Bilirubin: 3.2 mg/dL — ABNORMAL HIGH (ref 0.0–1.2)
Total Protein: 6.4 g/dL — ABNORMAL LOW (ref 6.5–8.1)

## 2024-06-19 LAB — CBC
HCT: 41 % (ref 36.0–46.0)
Hemoglobin: 13.8 g/dL (ref 12.0–15.0)
MCH: 31.6 pg (ref 26.0–34.0)
MCHC: 33.7 g/dL (ref 30.0–36.0)
MCV: 93.8 fL (ref 80.0–100.0)
Platelets: 122 10*3/uL — ABNORMAL LOW (ref 150–400)
RBC: 4.37 MIL/uL (ref 3.87–5.11)
RDW: 13.1 % (ref 11.5–15.5)
WBC: 4.6 10*3/uL (ref 4.0–10.5)
nRBC: 0 % (ref 0.0–0.2)

## 2024-06-19 LAB — RESP PANEL BY RT-PCR (RSV, FLU A&B, COVID)  RVPGX2
Influenza A by PCR: NEGATIVE
Influenza B by PCR: NEGATIVE
Resp Syncytial Virus by PCR: NEGATIVE
SARS Coronavirus 2 by RT PCR: NEGATIVE

## 2024-06-19 LAB — LIPASE, BLOOD: Lipase: 31 U/L (ref 11–51)

## 2024-06-19 NOTE — ED Notes (Signed)
 Pt. Saline lock removed by tech.

## 2024-06-19 NOTE — ED Triage Notes (Signed)
 Pt reports 3 days of malaise, generalized abd pain, and fever. Pt states that she has emphysema and is not any more SHOB than she normally is.  20g LAC 119cbg 160/60 HR 82

## 2024-06-21 ENCOUNTER — Inpatient Hospital Stay (HOSPITAL_COMMUNITY)
Admission: EM | Admit: 2024-06-21 | Discharge: 2024-07-08 | DRG: 853 | Disposition: A | Discharge status: Rehabilitation Facility | Attending: Neurology | Admitting: Neurology

## 2024-06-21 ENCOUNTER — Emergency Department (HOSPITAL_COMMUNITY)

## 2024-06-21 ENCOUNTER — Inpatient Hospital Stay (HOSPITAL_COMMUNITY)

## 2024-06-21 ENCOUNTER — Other Ambulatory Visit: Payer: Self-pay

## 2024-06-21 ENCOUNTER — Encounter (HOSPITAL_COMMUNITY): Payer: Self-pay | Admitting: Pharmacy Technician

## 2024-06-21 DIAGNOSIS — I6601 Occlusion and stenosis of right middle cerebral artery: Secondary | ICD-10-CM | POA: Diagnosis present

## 2024-06-21 DIAGNOSIS — R471 Dysarthria and anarthria: Secondary | ICD-10-CM | POA: Diagnosis not present

## 2024-06-21 DIAGNOSIS — I639 Cerebral infarction, unspecified: Secondary | ICD-10-CM

## 2024-06-21 DIAGNOSIS — R29715 NIHSS score 15: Secondary | ICD-10-CM | POA: Diagnosis not present

## 2024-06-21 DIAGNOSIS — J449 Chronic obstructive pulmonary disease, unspecified: Secondary | ICD-10-CM | POA: Diagnosis not present

## 2024-06-21 DIAGNOSIS — K769 Liver disease, unspecified: Secondary | ICD-10-CM | POA: Diagnosis not present

## 2024-06-21 DIAGNOSIS — I69391 Dysphagia following cerebral infarction: Secondary | ICD-10-CM | POA: Diagnosis not present

## 2024-06-21 DIAGNOSIS — R1312 Dysphagia, oropharyngeal phase: Secondary | ICD-10-CM | POA: Diagnosis not present

## 2024-06-21 DIAGNOSIS — R54 Age-related physical debility: Secondary | ICD-10-CM | POA: Diagnosis present

## 2024-06-21 DIAGNOSIS — E871 Hypo-osmolality and hyponatremia: Secondary | ICD-10-CM | POA: Diagnosis not present

## 2024-06-21 DIAGNOSIS — I69354 Hemiplegia and hemiparesis following cerebral infarction affecting left non-dominant side: Secondary | ICD-10-CM | POA: Diagnosis not present

## 2024-06-21 DIAGNOSIS — I2510 Atherosclerotic heart disease of native coronary artery without angina pectoris: Secondary | ICD-10-CM | POA: Diagnosis present

## 2024-06-21 DIAGNOSIS — E46 Unspecified protein-calorie malnutrition: Secondary | ICD-10-CM | POA: Diagnosis present

## 2024-06-21 DIAGNOSIS — A4151 Sepsis due to Escherichia coli [E. coli]: Secondary | ICD-10-CM | POA: Diagnosis present

## 2024-06-21 DIAGNOSIS — Z803 Family history of malignant neoplasm of breast: Secondary | ICD-10-CM

## 2024-06-21 DIAGNOSIS — I63511 Cerebral infarction due to unspecified occlusion or stenosis of right middle cerebral artery: Secondary | ICD-10-CM | POA: Diagnosis not present

## 2024-06-21 DIAGNOSIS — J9601 Acute respiratory failure with hypoxia: Secondary | ICD-10-CM | POA: Diagnosis not present

## 2024-06-21 DIAGNOSIS — Z853 Personal history of malignant neoplasm of breast: Secondary | ICD-10-CM

## 2024-06-21 DIAGNOSIS — I739 Peripheral vascular disease, unspecified: Secondary | ICD-10-CM | POA: Diagnosis present

## 2024-06-21 DIAGNOSIS — Z5321 Procedure and treatment not carried out due to patient leaving prior to being seen by health care provider: Secondary | ICD-10-CM | POA: Diagnosis present

## 2024-06-21 DIAGNOSIS — T481X5A Adverse effect of skeletal muscle relaxants [neuromuscular blocking agents], initial encounter: Secondary | ICD-10-CM | POA: Diagnosis not present

## 2024-06-21 DIAGNOSIS — I609 Nontraumatic subarachnoid hemorrhage, unspecified: Secondary | ICD-10-CM | POA: Diagnosis not present

## 2024-06-21 DIAGNOSIS — R443 Hallucinations, unspecified: Secondary | ICD-10-CM | POA: Diagnosis not present

## 2024-06-21 DIAGNOSIS — D649 Anemia, unspecified: Secondary | ICD-10-CM | POA: Diagnosis present

## 2024-06-21 DIAGNOSIS — E876 Hypokalemia: Secondary | ICD-10-CM | POA: Diagnosis not present

## 2024-06-21 DIAGNOSIS — F32A Depression, unspecified: Secondary | ICD-10-CM | POA: Diagnosis present

## 2024-06-21 DIAGNOSIS — G8114 Spastic hemiplegia affecting left nondominant side: Secondary | ICD-10-CM | POA: Diagnosis not present

## 2024-06-21 DIAGNOSIS — Z6828 Body mass index (BMI) 28.0-28.9, adult: Secondary | ICD-10-CM

## 2024-06-21 DIAGNOSIS — R29810 Facial weakness: Secondary | ICD-10-CM | POA: Diagnosis not present

## 2024-06-21 DIAGNOSIS — T424X5A Adverse effect of benzodiazepines, initial encounter: Secondary | ICD-10-CM | POA: Diagnosis not present

## 2024-06-21 DIAGNOSIS — B9620 Unspecified Escherichia coli [E. coli] as the cause of diseases classified elsewhere: Secondary | ICD-10-CM | POA: Diagnosis not present

## 2024-06-21 DIAGNOSIS — H53462 Homonymous bilateral field defects, left side: Secondary | ICD-10-CM | POA: Diagnosis not present

## 2024-06-21 DIAGNOSIS — Z8249 Family history of ischemic heart disease and other diseases of the circulatory system: Secondary | ICD-10-CM

## 2024-06-21 DIAGNOSIS — Z87891 Personal history of nicotine dependence: Secondary | ICD-10-CM

## 2024-06-21 DIAGNOSIS — M545 Low back pain, unspecified: Secondary | ICD-10-CM | POA: Diagnosis not present

## 2024-06-21 DIAGNOSIS — E8809 Other disorders of plasma-protein metabolism, not elsewhere classified: Secondary | ICD-10-CM | POA: Diagnosis present

## 2024-06-21 DIAGNOSIS — R001 Bradycardia, unspecified: Secondary | ICD-10-CM | POA: Diagnosis not present

## 2024-06-21 DIAGNOSIS — R8271 Bacteriuria: Secondary | ICD-10-CM | POA: Diagnosis present

## 2024-06-21 DIAGNOSIS — R414 Neurologic neglect syndrome: Secondary | ICD-10-CM | POA: Diagnosis not present

## 2024-06-21 DIAGNOSIS — F419 Anxiety disorder, unspecified: Secondary | ICD-10-CM | POA: Diagnosis present

## 2024-06-21 DIAGNOSIS — Z7982 Long term (current) use of aspirin: Secondary | ICD-10-CM

## 2024-06-21 DIAGNOSIS — Z923 Personal history of irradiation: Secondary | ICD-10-CM

## 2024-06-21 DIAGNOSIS — I110 Hypertensive heart disease with heart failure: Secondary | ICD-10-CM | POA: Diagnosis present

## 2024-06-21 DIAGNOSIS — G928 Other toxic encephalopathy: Secondary | ICD-10-CM | POA: Diagnosis not present

## 2024-06-21 DIAGNOSIS — I5031 Acute diastolic (congestive) heart failure: Secondary | ICD-10-CM | POA: Diagnosis not present

## 2024-06-21 DIAGNOSIS — I779 Disorder of arteries and arterioles, unspecified: Secondary | ICD-10-CM | POA: Diagnosis not present

## 2024-06-21 DIAGNOSIS — R29713 NIHSS score 13: Secondary | ICD-10-CM | POA: Diagnosis not present

## 2024-06-21 DIAGNOSIS — Z79899 Other long term (current) drug therapy: Secondary | ICD-10-CM

## 2024-06-21 DIAGNOSIS — M542 Cervicalgia: Secondary | ICD-10-CM | POA: Diagnosis not present

## 2024-06-21 DIAGNOSIS — H05232 Hemorrhage of left orbit: Secondary | ICD-10-CM | POA: Diagnosis not present

## 2024-06-21 DIAGNOSIS — Z833 Family history of diabetes mellitus: Secondary | ICD-10-CM

## 2024-06-21 DIAGNOSIS — I10 Essential (primary) hypertension: Secondary | ICD-10-CM | POA: Diagnosis not present

## 2024-06-21 DIAGNOSIS — I618 Other nontraumatic intracerebral hemorrhage: Secondary | ICD-10-CM | POA: Diagnosis not present

## 2024-06-21 DIAGNOSIS — K810 Acute cholecystitis: Secondary | ICD-10-CM | POA: Diagnosis present

## 2024-06-21 DIAGNOSIS — E785 Hyperlipidemia, unspecified: Secondary | ICD-10-CM | POA: Diagnosis present

## 2024-06-21 DIAGNOSIS — E559 Vitamin D deficiency, unspecified: Secondary | ICD-10-CM | POA: Diagnosis present

## 2024-06-21 DIAGNOSIS — G8194 Hemiplegia, unspecified affecting left nondominant side: Secondary | ICD-10-CM | POA: Diagnosis not present

## 2024-06-21 DIAGNOSIS — G4733 Obstructive sleep apnea (adult) (pediatric): Secondary | ICD-10-CM | POA: Diagnosis not present

## 2024-06-21 DIAGNOSIS — R531 Weakness: Secondary | ICD-10-CM | POA: Diagnosis not present

## 2024-06-21 DIAGNOSIS — E663 Overweight: Secondary | ICD-10-CM | POA: Diagnosis present

## 2024-06-21 DIAGNOSIS — A419 Sepsis, unspecified organism: Secondary | ICD-10-CM | POA: Diagnosis not present

## 2024-06-21 DIAGNOSIS — D696 Thrombocytopenia, unspecified: Secondary | ICD-10-CM | POA: Diagnosis not present

## 2024-06-21 DIAGNOSIS — Z90710 Acquired absence of both cervix and uterus: Secondary | ICD-10-CM

## 2024-06-21 DIAGNOSIS — R3129 Other microscopic hematuria: Secondary | ICD-10-CM | POA: Diagnosis present

## 2024-06-21 DIAGNOSIS — K819 Cholecystitis, unspecified: Secondary | ICD-10-CM | POA: Diagnosis present

## 2024-06-21 DIAGNOSIS — I5033 Acute on chronic diastolic (congestive) heart failure: Secondary | ICD-10-CM | POA: Diagnosis not present

## 2024-06-21 DIAGNOSIS — K219 Gastro-esophageal reflux disease without esophagitis: Secondary | ICD-10-CM | POA: Diagnosis present

## 2024-06-21 DIAGNOSIS — Z4682 Encounter for fitting and adjustment of non-vascular catheter: Secondary | ICD-10-CM | POA: Diagnosis not present

## 2024-06-21 DIAGNOSIS — D1803 Hemangioma of intra-abdominal structures: Secondary | ICD-10-CM | POA: Diagnosis present

## 2024-06-21 DIAGNOSIS — I6782 Cerebral ischemia: Secondary | ICD-10-CM | POA: Diagnosis not present

## 2024-06-21 DIAGNOSIS — I700 Atherosclerosis of aorta: Secondary | ICD-10-CM | POA: Diagnosis not present

## 2024-06-21 DIAGNOSIS — I69393 Ataxia following cerebral infarction: Secondary | ICD-10-CM

## 2024-06-21 DIAGNOSIS — R918 Other nonspecific abnormal finding of lung field: Secondary | ICD-10-CM | POA: Diagnosis not present

## 2024-06-21 DIAGNOSIS — J439 Emphysema, unspecified: Secondary | ICD-10-CM | POA: Diagnosis present

## 2024-06-21 DIAGNOSIS — H3530 Unspecified macular degeneration: Secondary | ICD-10-CM | POA: Diagnosis present

## 2024-06-21 DIAGNOSIS — K5792 Diverticulitis of intestine, part unspecified, without perforation or abscess without bleeding: Principal | ICD-10-CM

## 2024-06-21 DIAGNOSIS — Z823 Family history of stroke: Secondary | ICD-10-CM

## 2024-06-21 DIAGNOSIS — E440 Moderate protein-calorie malnutrition: Secondary | ICD-10-CM | POA: Diagnosis not present

## 2024-06-21 DIAGNOSIS — R1010 Upper abdominal pain, unspecified: Secondary | ICD-10-CM | POA: Diagnosis not present

## 2024-06-21 DIAGNOSIS — K029 Dental caries, unspecified: Secondary | ICD-10-CM | POA: Diagnosis not present

## 2024-06-21 DIAGNOSIS — R7989 Other specified abnormal findings of blood chemistry: Secondary | ICD-10-CM | POA: Diagnosis not present

## 2024-06-21 DIAGNOSIS — R7881 Bacteremia: Secondary | ICD-10-CM | POA: Diagnosis not present

## 2024-06-21 DIAGNOSIS — Z91040 Latex allergy status: Secondary | ICD-10-CM

## 2024-06-21 DIAGNOSIS — R932 Abnormal findings on diagnostic imaging of liver and biliary tract: Secondary | ICD-10-CM | POA: Diagnosis not present

## 2024-06-21 DIAGNOSIS — R29818 Other symptoms and signs involving the nervous system: Secondary | ICD-10-CM | POA: Diagnosis not present

## 2024-06-21 DIAGNOSIS — R1011 Right upper quadrant pain: Secondary | ICD-10-CM | POA: Diagnosis not present

## 2024-06-21 DIAGNOSIS — K7689 Other specified diseases of liver: Secondary | ICD-10-CM | POA: Diagnosis not present

## 2024-06-21 DIAGNOSIS — M25373 Other instability, unspecified ankle: Secondary | ICD-10-CM | POA: Diagnosis present

## 2024-06-21 DIAGNOSIS — R112 Nausea with vomiting, unspecified: Secondary | ICD-10-CM | POA: Diagnosis present

## 2024-06-21 DIAGNOSIS — I69392 Facial weakness following cerebral infarction: Secondary | ICD-10-CM | POA: Diagnosis not present

## 2024-06-21 DIAGNOSIS — K5901 Slow transit constipation: Secondary | ICD-10-CM | POA: Diagnosis not present

## 2024-06-21 DIAGNOSIS — I69322 Dysarthria following cerebral infarction: Secondary | ICD-10-CM | POA: Diagnosis not present

## 2024-06-21 DIAGNOSIS — R945 Abnormal results of liver function studies: Secondary | ICD-10-CM | POA: Diagnosis not present

## 2024-06-21 DIAGNOSIS — F05 Delirium due to known physiological condition: Secondary | ICD-10-CM | POA: Diagnosis not present

## 2024-06-21 LAB — COMPREHENSIVE METABOLIC PANEL WITH GFR
ALT: 334 U/L — ABNORMAL HIGH (ref 0–44)
AST: 155 U/L — ABNORMAL HIGH (ref 15–41)
Albumin: 3.2 g/dL — ABNORMAL LOW (ref 3.5–5.0)
Alkaline Phosphatase: 103 U/L (ref 38–126)
Anion gap: 11 (ref 5–15)
BUN: 26 mg/dL — ABNORMAL HIGH (ref 8–23)
CO2: 25 mmol/L (ref 22–32)
Calcium: 8.9 mg/dL (ref 8.9–10.3)
Chloride: 97 mmol/L — ABNORMAL LOW (ref 98–111)
Creatinine, Ser: 0.93 mg/dL (ref 0.44–1.00)
GFR, Estimated: 59 mL/min — ABNORMAL LOW (ref >60)
Glucose, Bld: 99 mg/dL (ref 70–99)
Potassium: 3.7 mmol/L (ref 3.5–5.1)
Sodium: 133 mmol/L — ABNORMAL LOW (ref 135–145)
Total Bilirubin: 1.3 mg/dL — ABNORMAL HIGH (ref 0.0–1.2)
Total Protein: 6.3 g/dL — ABNORMAL LOW (ref 6.5–8.1)

## 2024-06-21 LAB — CBC WITH DIFFERENTIAL/PLATELET
Abs Immature Granulocytes: 1.07 K/uL — ABNORMAL HIGH (ref 0.00–0.07)
Basophils Absolute: 0.1 K/uL (ref 0.0–0.1)
Basophils Relative: 0 %
Eosinophils Absolute: 0.2 K/uL (ref 0.0–0.5)
Eosinophils Relative: 1 %
HCT: 37.3 % (ref 36.0–46.0)
Hemoglobin: 13 g/dL (ref 12.0–15.0)
Immature Granulocytes: 8 %
Lymphocytes Relative: 3 %
Lymphs Abs: 0.4 K/uL — ABNORMAL LOW (ref 0.7–4.0)
MCH: 32.4 pg (ref 26.0–34.0)
MCHC: 34.9 g/dL (ref 30.0–36.0)
MCV: 93 fL (ref 80.0–100.0)
Monocytes Absolute: 0.5 K/uL (ref 0.1–1.0)
Monocytes Relative: 4 %
Neutro Abs: 11.8 K/uL — ABNORMAL HIGH (ref 1.7–7.7)
Neutrophils Relative %: 84 %
Platelets: 102 K/uL — ABNORMAL LOW (ref 150–400)
RBC: 4.01 MIL/uL (ref 3.87–5.11)
RDW: 13.4 % (ref 11.5–15.5)
Smear Review: DECREASED
WBC Morphology: INCREASED
WBC: 14 K/uL — ABNORMAL HIGH (ref 4.0–10.5)
nRBC: 0 % (ref 0.0–0.2)

## 2024-06-21 LAB — URINALYSIS, ROUTINE W REFLEX MICROSCOPIC
Bilirubin Urine: NEGATIVE
Glucose, UA: NEGATIVE mg/dL
Ketones, ur: NEGATIVE mg/dL
Leukocytes,Ua: NEGATIVE
Nitrite: POSITIVE — AB
Protein, ur: NEGATIVE mg/dL
Specific Gravity, Urine: 1.013 (ref 1.005–1.030)
pH: 7 (ref 5.0–8.0)

## 2024-06-21 LAB — LIPASE, BLOOD: Lipase: 27 U/L (ref 11–51)

## 2024-06-21 LAB — I-STAT CG4 LACTIC ACID, ED: Lactic Acid, Venous: 0.8 mmol/L (ref 0.5–1.9)

## 2024-06-21 MED ORDER — POLYETHYLENE GLYCOL 3350 17 G PO PACK: 17.0000 g | PACK | Freq: Every day | ORAL | Status: DC | PRN

## 2024-06-21 MED ORDER — SODIUM CHLORIDE 0.9 % IV BOLUS
500.0000 mL | Freq: Once | INTRAVENOUS | Status: AC
  Administered 2024-06-21: 500 mL via INTRAVENOUS

## 2024-06-21 MED ORDER — SODIUM CHLORIDE 0.9 % IV BOLUS
1000.0000 mL | Freq: Once | INTRAVENOUS | Status: AC
  Administered 2024-06-21: 1000 mL via INTRAVENOUS

## 2024-06-21 MED ORDER — SODIUM CHLORIDE 0.9 % IV SOLN
2.0000 g | Freq: Once | INTRAVENOUS | Status: AC
  Administered 2024-06-21: 2 g via INTRAVENOUS
  Filled 2024-06-21: qty 20

## 2024-06-21 MED ORDER — ALBUTEROL SULFATE (2.5 MG/3ML) 0.083% IN NEBU
2.5000 mg | INHALATION_SOLUTION | RESPIRATORY_TRACT | Status: DC | PRN
  Administered 2024-06-22 – 2024-06-25 (×6): 2.5 mg via RESPIRATORY_TRACT
  Filled 2024-06-21 (×6): qty 3

## 2024-06-21 MED ORDER — MELATONIN 3 MG PO TABS
6.0000 mg | ORAL_TABLET | Freq: Every evening | ORAL | Status: DC | PRN
  Administered 2024-06-23 (×2): 6 mg via ORAL
  Filled 2024-06-21 (×2): qty 2

## 2024-06-21 MED ORDER — UMECLIDINIUM BROMIDE 62.5 MCG/ACT IN AEPB
1.0000 | INHALATION_SPRAY | Freq: Every day | RESPIRATORY_TRACT | Status: DC
  Filled 2024-06-21: qty 7

## 2024-06-21 MED ORDER — ONDANSETRON HCL 4 MG/2ML IJ SOLN
4.0000 mg | Freq: Four times a day (QID) | INTRAMUSCULAR | Status: DC | PRN
  Administered 2024-06-21 – 2024-07-04 (×11): 4 mg via INTRAVENOUS
  Filled 2024-06-21 (×10): qty 2

## 2024-06-21 MED ORDER — METRONIDAZOLE 500 MG/100ML IV SOLN
500.0000 mg | Freq: Two times a day (BID) | INTRAVENOUS | Status: DC
  Administered 2024-06-22 – 2024-06-25 (×7): 500 mg via INTRAVENOUS
  Filled 2024-06-21 (×7): qty 100

## 2024-06-21 MED ORDER — SODIUM CHLORIDE 0.9 % IV SOLN: INTRAVENOUS | Status: AC

## 2024-06-21 MED ORDER — ACETAMINOPHEN 500 MG PO TABS: 500.0000 mg | ORAL_TABLET | Freq: Four times a day (QID) | ORAL | Status: DC | PRN

## 2024-06-21 MED ORDER — OXYCODONE HCL 5 MG PO TABS: 5.0000 mg | ORAL_TABLET | Freq: Four times a day (QID) | ORAL | Status: DC | PRN

## 2024-06-21 MED ORDER — BUPROPION HCL ER (XL) 150 MG PO TB24
150.0000 mg | ORAL_TABLET | Freq: Every day | ORAL | Status: DC
  Administered 2024-06-22 – 2024-06-25 (×4): 150 mg via ORAL
  Filled 2024-06-21 (×4): qty 1

## 2024-06-21 MED ORDER — ESCITALOPRAM OXALATE 10 MG PO TABS
40.0000 mg | ORAL_TABLET | Freq: Every day | ORAL | Status: DC
  Administered 2024-06-22 – 2024-06-25 (×4): 40 mg via ORAL
  Filled 2024-06-21 (×3): qty 4

## 2024-06-21 MED ORDER — OXYCODONE HCL 5 MG PO TABS: 2.5000 mg | ORAL_TABLET | Freq: Four times a day (QID) | ORAL | Status: DC | PRN

## 2024-06-21 MED ORDER — ONDANSETRON HCL 4 MG/2ML IJ SOLN
4.0000 mg | Freq: Once | INTRAMUSCULAR | Status: AC
  Administered 2024-06-21: 4 mg via INTRAVENOUS
  Filled 2024-06-21: qty 2

## 2024-06-21 MED ORDER — PANTOPRAZOLE SODIUM 40 MG PO TBEC
40.0000 mg | DELAYED_RELEASE_TABLET | Freq: Every day | ORAL | Status: DC
  Administered 2024-06-22 – 2024-06-25 (×4): 40 mg via ORAL
  Filled 2024-06-21 (×4): qty 1

## 2024-06-21 MED ORDER — SODIUM CHLORIDE 0.9% FLUSH
3.0000 mL | Freq: Two times a day (BID) | INTRAVENOUS | Status: DC
  Administered 2024-06-21 – 2024-07-08 (×30): 3 mL via INTRAVENOUS

## 2024-06-21 MED ORDER — SODIUM CHLORIDE 0.9 % IV SOLN
2.0000 g | INTRAVENOUS | Status: DC
  Administered 2024-06-22 – 2024-06-26 (×5): 2 g via INTRAVENOUS
  Filled 2024-06-21 (×5): qty 20

## 2024-06-21 MED ORDER — IOHEXOL 350 MG/ML SOLN
75.0000 mL | Freq: Once | INTRAVENOUS | Status: AC | PRN
  Administered 2024-06-21: 75 mL via INTRAVENOUS

## 2024-06-21 MED ORDER — METRONIDAZOLE 500 MG/100ML IV SOLN
500.0000 mg | Freq: Once | INTRAVENOUS | Status: AC
  Administered 2024-06-21: 500 mg via INTRAVENOUS
  Filled 2024-06-21: qty 100

## 2024-06-21 NOTE — H&P (Signed)
 History and Physical    [name] FMW:[id number] DOB: Jan 03, 1932 DOA: 06/21/2024  PCP: [name], MD   Patient coming from: Home   Chief Complaint:  Chief Complaint  Patient presents with   nausea/vomiting recheck    HPI:  [name] is a 89 y.o. female with hx of COPD, PAD, hyperlipidemia, OSA, right breast invasive ductal carcinoma stage Ia s/p lumpectomy, radiation, hormone therapy, who presented with right upper quadrant abdominal pain radiating to the back and nausea and vomiting.  Symptoms began acutely on Thurs 7/3.  She then presented to [facility] and lab work was done demonstrating acute liver injury but she was not seen and left from the waiting room.  Since onset she has had poor oral intake and frequent vomiting.  Emesis described as regurgitated material, no hematemesis, coffee-ground emesis. Abd pain has been intermittent. Pain in the back radiates down to legs. C/o mucousy stool. + chills.  Generalized weakness . denies other recent illness, no dysuria.  Prior abdominal surgeries including C-section, hysterectomy. No known Gallbladder disease in the past.   Review of Systems:  ROS complete and negative except as marked above   Allergies  Allergen Reactions   Adhesive [Tape] Itching and Other (See Comments)    Little red itchy bumps develop   Latex Itching and Other (See Comments)    Little red itchy bumps develop    Prior to Admission medications   Medication Sig Start Date End Date Taking? Authorizing Provider  acetaminophen  (TYLENOL ) 650 MG CR tablet Take 650 mg by mouth 2 (two) times daily as needed for pain.     [provider]  albuterol  (PROVENTIL  HFA;VENTOLIN  HFA) 108 (90 Base) MCG/ACT inhaler Inhale 2 puffs into the lungs every 6 (six) hours as needed for wheezing or shortness of breath.    [provider]  aspirin  EC 81 MG tablet Take 81 mg by mouth daily.    [provider]  atorvastatin  (LIPITOR) 10 MG tablet  Take 10 mg by mouth at bedtime.    [provider]  cariprazine (VRAYLAR) 3 MG capsule 1 capsule    [provider]  Cholecalciferol (VITAMIN D) 2000 units tablet Take 4,000 Units by mouth daily.     [provider]  escitalopram  (LEXAPRO ) 20 MG tablet Take 40 mg by mouth daily. 08/01/17   [provider]  fluticasone (FLONASE) 50 MCG/ACT nasal spray Place 2 sprays into both nostrils daily.    [provider]  Multiple Vitamin (MULTIVITAMIN WITH MINERALS) TABS tablet Take 1 tablet by mouth daily.    [provider]  pantoprazole  (PROTONIX ) 20 MG tablet Take 20 mg by mouth daily.    [provider]  Polyethyl Glycol-Propyl Glycol (SYSTANE) 0.4-0.3 % SOLN Place 1 drop into both eyes daily as needed (dry eyes).    [provider]  tiotropium (SPIRIVA) 18 MCG inhalation capsule Place 18 mcg into inhaler and inhale daily.    [provider]    Past Medical History:  Diagnosis Date   Anxiety    Back pain    Cancer (HCC)    Depression    Emphysema    Esophageal spasm    Lung nodule    OSA (obstructive sleep apnea)    Osteomalacia    Personal history of radiation therapy    Vertigo    Vitamin D deficiency     Past Surgical History:  Procedure Laterality Date   ABDOMINAL HYSTERECTOMY  APPENDECTOMY     BREAST BIOPSY Right 08/31/2020   BREAST EXCISIONAL BIOPSY Left 1978   BREAST LUMPECTOMY Right 09/30/2020   BREAST LUMPECTOMY WITH RADIOACTIVE SEED LOCALIZATION Right 09/30/2020   Procedure: RIGHT BREAST LUMPECTOMY WITH RADIOACTIVE SEED LOCALIZATION;  Surgeon: Curvin Deward MOULD, MD;  Location: [facility];  Service: General;  Laterality: Right;   CARPAL TUNNEL RELEASE     ROTATOR CUFF REPAIR       reports that she quit smoking about 8 years ago. Her smoking use included cigarettes. She started smoking about 64 years ago. She has a 56 pack-year smoking history. She has never used smokeless tobacco.  She reports that she does not drink alcohol and does not use drugs.  Family History  Problem Relation Age of Onset   Diabetes type II Other    Hypertension Other    Breast cancer Maternal Aunt        dx early 34s   Other Mother        died of natural causes at age 61   Other Father        stroke or heart attack while in shower   Diabetes type II Sister    Cancer Paternal Uncle        unknown type; dx early 51s     Physical Exam: Vitals:   06/21/24 1815 06/21/24 1915 06/21/24 1930 06/21/24 1945  BP: (!) 136/53 (!) 134/56 135/64 90/78  Pulse: 71 69 70 70  Resp: 18 (!) 21 17 20   Temp:      TempSrc:      SpO2: 96% 95% 94% 96%  Weight:      Height:        Gen: Awake, alert, chronically ill-appearing, elderly, frail. CV: Regular, normal S1, S2, no murmurs  Resp: Normal WOB, CTAB  Abd: Flat, hyperactive, there is moderate right upper quadrant tenderness.  No rebound, guarding, rigidity. MSK: Symmetric, no edema  Skin: No rashes or lesions to exposed skin  Neuro: Alert and interactive  Psych: euthymic, appropriate    Data review:   Labs reviewed, notable for:   Lactate 0.8 7/3 LFT T. bili 3.2, AST 1658, ALT 958, alk phos 117 -> today T. bili 1.3, AST 155, ALT 334, alk phos 103 WBC 14 with bandemia, platelet 102 UA many bacteria, microscopic hematuria, no pyuria  Micro:  Results for orders placed or performed during the hospital encounter of 06/19/24  Resp panel by RT-PCR (RSV, Flu A&B, Covid) Anterior Nasal Swab     Status: None   Collection Time: 06/19/24  7:04 PM   Specimen: Anterior Nasal Swab  Result Value Ref Range Status   SARS Coronavirus 2 by RT PCR NEGATIVE NEGATIVE Final    Comment: (NOTE) SARS-CoV-2 target nucleic acids are NOT DETECTED.  The SARS-CoV-2 RNA is generally detectable in upper respiratory specimens during the acute phase of infection. The lowest concentration of SARS-CoV-2 viral copies this assay can detect is 138 copies/mL. A negative  result does not preclude SARS-Cov-2 infection and should not be used as the sole basis for treatment or other patient management decisions. A negative result may occur with  improper specimen collection/handling, submission of specimen other than nasopharyngeal swab, presence of viral mutation(s) within the areas targeted by this assay, and inadequate number of viral copies(<138 copies/mL). A negative result must be combined with clinical observations, patient history, and epidemiological information. The expected result is Negative.  Fact Sheet for Patients:  BloggerCourse.com  Fact Sheet for  Healthcare Providers:  SeriousBroker.it  This test is no t yet approved or cleared by the [place]  FDA and  has been authorized for detection and/or diagnosis of SARS-CoV-2 by FDA under an Emergency Use Authorization (EUA). This EUA will remain  in effect (meaning this test can be used) for the duration of the COVID-19 declaration under Section 564(b)(1) of the Act, 21 U.S.C.section 360bbb-3(b)(1), unless the authorization is terminated  or revoked sooner.       Influenza A by PCR NEGATIVE NEGATIVE Final   Influenza B by PCR NEGATIVE NEGATIVE Final    Comment: (NOTE) The Xpert Xpress SARS-CoV-2/FLU/RSV plus assay is intended as an aid in the diagnosis of influenza from Nasopharyngeal swab specimens and should not be used as a sole basis for treatment. Nasal washings and aspirates are unacceptable for Xpert Xpress SARS-CoV-2/FLU/RSV testing.  Fact Sheet for Patients: BloggerCourse.com  Fact Sheet for Healthcare Providers: SeriousBroker.it  This test is not yet approved or cleared by the [place]  FDA and has been authorized for detection and/or diagnosis of SARS-CoV-2 by FDA under an Emergency Use Authorization (EUA). This EUA will remain in effect (meaning this test can be used)  for the duration of the COVID-19 declaration under Section 564(b)(1) of the Act, 21 U.S.C. section 360bbb-3(b)(1), unless the authorization is terminated or revoked.     Resp Syncytial Virus by PCR NEGATIVE NEGATIVE Final    Comment: (NOTE) Fact Sheet for Patients: BloggerCourse.com  Fact Sheet for Healthcare Providers: SeriousBroker.it  This test is not yet approved or cleared by the [place]  FDA and has been authorized for detection and/or diagnosis of SARS-CoV-2 by FDA under an Emergency Use Authorization (EUA). This EUA will remain in effect (meaning this test can be used) for the duration of the COVID-19 declaration under Section 564(b)(1) of the Act, 21 U.S.C. section 360bbb-3(b)(1), unless the authorization is terminated or revoked.  Performed at [facility], [address] [place], [place] [address]     Imaging reviewed:  CT ABDOMEN PELVIS W CONTRAST Result Date: 06/21/2024 CLINICAL DATA:  Abdomen pain EXAM: CT ABDOMEN AND PELVIS WITH CONTRAST TECHNIQUE: Multidetector CT imaging of the abdomen and pelvis was performed using the standard protocol following bolus administration of intravenous contrast. RADIATION DOSE REDUCTION: This exam was performed according to the departmental dose-optimization program which includes automated exposure control, adjustment of the mA and/or kV according to patient size and/or use of iterative reconstruction technique. CONTRAST:  75mL OMNIPAQUE  IOHEXOL  350 MG/ML SOLN COMPARISON:  CT 03/14/2020 FINDINGS: Lower chest: Lung bases demonstrate mild subpleural scarring. No acute airspace disease. Pectus deformity of the lower anterior chest wall. Hepatobiliary: Distended gallbladder. No calcified stones. Possible trace pericholecystic fluid versus gallbladder wall thickening on coronal images. No significant biliary dilatation. Indeterminate hypodensity in the left hepatic lobe  measuring about 13 mm on series 3, image 21. Pancreas: Atrophic.  No acute inflammation Spleen: Normal in size without focal abnormality. Adrenals/Urinary Tract: Adrenal glands are normal. Kidneys show slight prominence of collecting systems but no hydronephrosis or obstructing stone. Bladder is unremarkable Stomach/Bowel: The stomach is nonenlarged. No dilated small bowel. Diverticular disease of the left colon. No acute wall thickening Vascular/Lymphatic: Aortic atherosclerosis. No enlarged abdominal or pelvic lymph nodes. Reproductive: Status post hysterectomy. No adnexal masses. Other: Negative for pelvic effusion or free air. Musculoskeletal: No acute or suspicious osseous abnormality. Multilevel degenerative change IMPRESSION: 1. Distended gallbladder with possible trace pericholecystic fluid versus gallbladder wall thickening. Suggest correlation with right upper quadrant ultrasound.  2. Diverticular disease of the left colon without acute inflammatory process. 3. Indeterminate 13 mm hypodensity in the left hepatic lobe. When the patient is clinically stable and able to follow directions and hold their breath (preferably as an outpatient) further evaluation with dedicated abdominal MRI should be considered if deemed clinically appropriate 4. Aortic atherosclerosis. Aortic Atherosclerosis (ICD10-I70.0). Electronically Signed   By: Luke Bun M.D.   On: 06/21/2024 17:23    Personally reviewed CT abdomen pelvis   ED Course:  Treated with 1 L IV fluid, ceftriaxone , Flagyl , Zofran .   Assessment/Plan:  89 y.o. female with hx COPD, PAD, hyperlipidemia, OSA, right breast invasive ductal carcinoma stage Ia s/p lumpectomy, radiation, hormone therapy, who presented with right upper quadrant abdominal pain radiating to the back and nausea and vomiting. Initial presentation to [facility] on 7/3 with findings of ALI but left before she was seen. Returns today, suspected cholecystitis with question of passed  choledocholithiasis given improving LFT.   Suspected cholecystitis  Question passed choledocholithiasis  Acute liver injury, mixed / predominately hepatocellular, improving  P/w acute RUQ pain radiating to back, N/V since 7/3. Presented to [facility] 7/3 but left before seen. Labs with ALI. On initial eval today, VS are wnl, afebrile. Exam RUQ TTP. 7/3 LFT T. bili 3.2, AST 1658, ALT 958, alk phos 117 -> today T. bili 1.3, AST 155, ALT 334, alk phos 103. WBC 14 with bandemia, Lactate 0.8.  CT abdomen pelvis demonstrating gallbladder distention with trace pericholecystic fluid versus wall thickening. - Routine general surgery consult in the a.m., not contacted overnight - Right upper quadrant ultrasound to further evaluate for cholecystitis  - Continue ceftriaxone  2 g IV every 24 hours, Flagyl  500 mg IV every 12 hours - S/p 1 L IV fluid, given additional 500 cc and then continue maintenance IV fluid 100 cc an hour - Clear liquid diet for now - Symptomatic management Tylenol  as needed mild, oxycodone  2.5/5 mg every 6 hours as needed for moderate/severe.  Zofran  as needed nausea  Mild hyponatremia, suspect hypovolemic with low solute - IV fluids per above.  Trend BMP  Asymptomatic bacteriuria,  Microscopic hematuria - UA not consistent with UTI with absent pyuria  - Repeat UA outpatient re: eval microscopic hematuria  Incidental findings: Indeterminant 1.3 cm left hepatic lobe hypodense lesion: Recommended for MRI outpatient  Chronic medical problems: COPD: Continue home Spiriva equivalent.  Albuterol  as needed PAD: Hold home aspirin  for secondary prevention in case requires upcoming surgery.  Hold statin with liver injury. Hyperlipidemia: See PAD above. OSA: Not currently using CPAP right breast invasive ductal carcinoma: stage Ia s/p lumpectomy, radiation, hormone therapy.  In remission.  Follows with Dr. Lanny with heme-onc Mood disorder: Continue home escitalopram , bupropion  GERD: Continue on  PPI.  Body mass index is 27.96 kg/m.    DVT prophylaxis:  SCDs Code Status:  Full Code Diet:  Diet Orders (From admission, onward)     Start     Ordered   06/21/24 1927  Diet clear liquid Room service appropriate? Yes; Fluid consistency: Thin  Diet effective now       Question Answer Comment  Room service appropriate? Yes   Fluid consistency: Thin      06/21/24 1927           Family Communication: Yes discussed with her daughter at the bedside Consults: None Admission status:   Inpatient, Med-Surg  Severity of Illness: The appropriate patient status for this patient is INPATIENT. Inpatient status is judged to  be reasonable and necessary in order to provide the required intensity of service to ensure the patient's safety. The patient's presenting symptoms, physical exam findings, and initial radiographic and laboratory data in the context of their chronic comorbidities is felt to place them at high risk for further clinical deterioration. Furthermore, it is not anticipated that the patient will be medically stable for discharge from the hospital within 2 midnights of admission.   * I certify that at the point of admission it is my clinical judgment that the patient will require inpatient hospital care spanning beyond 2 midnights from the point of admission due to high intensity of service, high risk for further deterioration and high frequency of surveillance required.*   Dorn Dawson, MD [facility]  How to contact the [facility] Attending or Consulting provider 7A - 7P or covering provider during after hours 7P -7A, for this patient.  Check the care team in [facility] and look for a) attending/consulting [facility] provider listed and b) the [facility] team listed Log into [email or web address] and use [place]'s universal password to access. If you do not have the password, please contact the hospital operator. Locate the [facility] provider you are looking for under [facility] and page to a  number that you can be directly reached. If you still have difficulty reaching the provider, please page the [facility] (Director on Call) for the Hospitalists listed on amion for assistance.  06/21/2024, 7:51 PM

## 2024-06-21 NOTE — ED Notes (Signed)
 Patient transported to CT

## 2024-06-21 NOTE — ED Notes (Signed)
 Ultrasound at bedside

## 2024-06-21 NOTE — ED Notes (Signed)
 ED TO INPATIENT HANDOFF REPORT  ED Nurse Name and Phone #: Janeth RN [phone]  S Name/Age/Gender [name] 89 y.o. female Room/Bed: 022C/022C  Code Status   Code Status: Full Code  Home/SNF/Other Home Patient oriented to: self, place, time, and situation Is this baseline? Yes   Triage Complete: Triage complete  Chief Complaint Cholecystitis [K81.9]  Triage Note Pt bib ems from home with reports of NV weakness for the last week. Seen at [facility] for same several days ago, left AMA. AST and ALT elevated, told by PCP to come back to the hospital.  Last emesis yesterday. Endorses upper abdominal pain and fevers, TMAX 101.6.    Allergies Allergies  Allergen Reactions   Adhesive [Tape] Itching and Other (See Comments)    Little red itchy bumps develop   Latex Itching and Other (See Comments)    Little red itchy bumps develop    Level of Care/Admitting Diagnosis ED Disposition     ED Disposition  Admit   Condition  --   Comment  Hospital Area: [facility] [[id number]]  Level of Care: Med-Surg [16]  May admit patient to [facility] or [facility] if equivalent level of care is available:: No  Covid Evaluation: Asymptomatic - no recent exposure (last 10 days) testing not required  Diagnosis: Cholecystitis [807368]  Admitting Physician: SEGARS, JONATHAN [[id number]]  Attending Physician: SEGARS, JONATHAN [[id number]]  Certification:: I certify this patient will need inpatient services for at least 2 midnights  Expected Medical Readiness: 06/23/2024          B Medical/Surgery History Past Medical History:  Diagnosis Date   Anxiety    Back pain    Cancer (HCC)    Depression    Emphysema    Esophageal spasm    Lung nodule    OSA (obstructive sleep apnea)    Osteomalacia    Personal history of radiation therapy    Vertigo    Vitamin D deficiency    Past Surgical History:  Procedure Laterality Date   ABDOMINAL HYSTERECTOMY     APPENDECTOMY      BREAST BIOPSY Right 08/31/2020   BREAST EXCISIONAL BIOPSY Left 1978   BREAST LUMPECTOMY Right 09/30/2020   BREAST LUMPECTOMY WITH RADIOACTIVE SEED LOCALIZATION Right 09/30/2020   Procedure: RIGHT BREAST LUMPECTOMY WITH RADIOACTIVE SEED LOCALIZATION;  Surgeon: Curvin Deward MOULD, MD;  Location:  SURGERY CENTER;  Service: General;  Laterality: Right;   CARPAL TUNNEL RELEASE     ROTATOR CUFF REPAIR       A IV Location/Drains/Wounds Patient Lines/Drains/Airways Status     Active Line/Drains/Airways     Name Placement date Placement time Site Days   Peripheral IV 06/21/24 20 G Anterior;Right Forearm 06/21/24  1532  Forearm  less than 1   Wound 04/22/12 Other (Comment) Knee Right red non blachable round 1/2 cm  04/22/12  0406  Knee  4443            Intake/Output Last 24 hours No intake or output data in the 24 hours ending 06/21/24 1931  Labs/Imaging Results for orders placed or performed during the hospital encounter of 06/21/24 (from the past 48 hours)  CBC with Differential     Status: Abnormal   Collection Time: 06/21/24  3:32 PM  Result Value Ref Range   WBC 14.0 (H) 4.0 - 10.5 K/uL   RBC 4.01 3.87 - 5.11 MIL/uL   Hemoglobin 13.0 12.0 - 15.0 g/dL   HCT 62.6 63.9 - 53.9 %  MCV 93.0 80.0 - 100.0 fL   MCH 32.4 26.0 - 34.0 pg   MCHC 34.9 30.0 - 36.0 g/dL   RDW 86.5 88.4 - 84.4 %   Platelets 102 (L) 150 - 400 K/uL    Comment: SPECIMEN CHECKED FOR CLOTS Immature Platelet Fraction may be clinically indicated, consider ordering this additional test [id number] REPEATED TO VERIFY PLATELET COUNT CONFIRMED BY SMEAR    nRBC 0.0 0.0 - 0.2 %   Neutrophils Relative % 84 %   Neutro Abs 11.8 (H) 1.7 - 7.7 K/uL   Lymphocytes Relative 3 %   Lymphs Abs 0.4 (L) 0.7 - 4.0 K/uL   Monocytes Relative 4 %   Monocytes Absolute 0.5 0.1 - 1.0 K/uL   Eosinophils Relative 1 %   Eosinophils Absolute 0.2 0.0 - 0.5 K/uL   Basophils Relative 0 %   Basophils Absolute 0.1 0.0 - 0.1 K/uL    WBC Morphology INCREASED BANDS (>20% BANDS)    RBC Morphology MORPHOLOGY UNREMARKABLE    Smear Review PLATELETS APPEAR DECREASED    Immature Granulocytes 8 %   Abs Immature Granulocytes 1.07 (H) 0.00 - 0.07 K/uL    Comment: Performed at [facility], [address], [place], [place] [address]  Comprehensive metabolic panel     Status: Abnormal   Collection Time: 06/21/24  3:32 PM  Result Value Ref Range   Sodium 133 (L) 135 - 145 mmol/L   Potassium 3.7 3.5 - 5.1 mmol/L   Chloride 97 (L) 98 - 111 mmol/L   CO2 25 22 - 32 mmol/L   Glucose, Bld 99 70 - 99 mg/dL    Comment: Glucose reference range applies only to samples taken after fasting for at least 8 hours.   BUN 26 (H) 8 - 23 mg/dL   Creatinine, Ser 9.06 0.44 - 1.00 mg/dL   Calcium  8.9 8.9 - 10.3 mg/dL   Total Protein 6.3 (L) 6.5 - 8.1 g/dL   Albumin 3.2 (L) 3.5 - 5.0 g/dL   AST 844 (H) 15 - 41 U/L   ALT 334 (H) 0 - 44 U/L   Alkaline Phosphatase 103 38 - 126 U/L   Total Bilirubin 1.3 (H) 0.0 - 1.2 mg/dL   GFR, Estimated 59 (L) >60 mL/min    Comment: (NOTE) Calculated using the CKD-EPI Creatinine Equation (2021)    Anion gap 11 5 - 15    Comment: Performed at [facility], [address], [place], [place] [address]  Lipase, blood     Status: None   Collection Time: 06/21/24  3:32 PM  Result Value Ref Range   Lipase 27 11 - 51 U/L    Comment: Performed at [facility], [address] [place], [place] [address]  Urinalysis, Routine w reflex microscopic -Urine, Clean Catch     Status: Abnormal   Collection Time: 06/21/24  3:32 PM  Result Value Ref Range   Color, Urine YELLOW YELLOW   APPearance CLEAR CLEAR   Specific Gravity, Urine 1.013 1.005 - 1.030   pH 7.0 5.0 - 8.0   Glucose, UA NEGATIVE NEGATIVE mg/dL   Hgb urine dipstick MODERATE (A) NEGATIVE   Bilirubin Urine NEGATIVE NEGATIVE   Ketones, ur NEGATIVE NEGATIVE mg/dL   Protein, ur NEGATIVE NEGATIVE mg/dL   Nitrite POSITIVE (A) NEGATIVE    Leukocytes,Ua NEGATIVE NEGATIVE   RBC / HPF 6-10 0 - 5 RBC/hpf   WBC, UA 0-5 0 - 5 WBC/hpf   Bacteria, UA MANY (  A) NONE SEEN   Squamous Epithelial / HPF 0-5 0 - 5 /HPF   Mucus PRESENT     Comment: Performed at [facility], [address], [place], [place] [address]  I-Stat Lactic Acid     Status: None   Collection Time: 06/21/24  5:36 PM  Result Value Ref Range   Lactic Acid, Venous 0.8 0.5 - 1.9 mmol/L   CT ABDOMEN PELVIS W CONTRAST Result Date: 06/21/2024 CLINICAL DATA:  Abdomen pain EXAM: CT ABDOMEN AND PELVIS WITH CONTRAST TECHNIQUE: Multidetector CT imaging of the abdomen and pelvis was performed using the standard protocol following bolus administration of intravenous contrast. RADIATION DOSE REDUCTION: This exam was performed according to the departmental dose-optimization program which includes automated exposure control, adjustment of the mA and/or kV according to patient size and/or use of iterative reconstruction technique. CONTRAST:  75mL OMNIPAQUE  IOHEXOL  350 MG/ML SOLN COMPARISON:  CT 03/14/2020 FINDINGS: Lower chest: Lung bases demonstrate mild subpleural scarring. No acute airspace disease. Pectus deformity of the lower anterior chest wall. Hepatobiliary: Distended gallbladder. No calcified stones. Possible trace pericholecystic fluid versus gallbladder wall thickening on coronal images. No significant biliary dilatation. Indeterminate hypodensity in the left hepatic lobe measuring about 13 mm on series 3, image 21. Pancreas: Atrophic.  No acute inflammation Spleen: Normal in size without focal abnormality. Adrenals/Urinary Tract: Adrenal glands are normal. Kidneys show slight prominence of collecting systems but no hydronephrosis or obstructing stone. Bladder is unremarkable Stomach/Bowel: The stomach is nonenlarged. No dilated small bowel. Diverticular disease of the left colon. No acute wall thickening Vascular/Lymphatic: Aortic atherosclerosis. No enlarged abdominal or pelvic  lymph nodes. Reproductive: Status post hysterectomy. No adnexal masses. Other: Negative for pelvic effusion or free air. Musculoskeletal: No acute or suspicious osseous abnormality. Multilevel degenerative change IMPRESSION: 1. Distended gallbladder with possible trace pericholecystic fluid versus gallbladder wall thickening. Suggest correlation with right upper quadrant ultrasound. 2. Diverticular disease of the left colon without acute inflammatory process. 3. Indeterminate 13 mm hypodensity in the left hepatic lobe. When the patient is clinically stable and able to follow directions and hold their breath (preferably as an outpatient) further evaluation with dedicated abdominal MRI should be considered if deemed clinically appropriate 4. Aortic atherosclerosis. Aortic Atherosclerosis (ICD10-I70.0). Electronically Signed   By: Luke Bun M.D.   On: 06/21/2024 17:23    Pending Labs Unresulted Labs (From admission, onward)     Start     Ordered   06/22/24 0500  Basic metabolic panel with GFR  Tomorrow morning,   R        06/21/24 1927   06/22/24 0500  CBC  Tomorrow morning,   R        06/21/24 1927   06/22/24 0500  Magnesium   Tomorrow morning,   R        06/21/24 1927   06/22/24 0500  Phosphorus  Tomorrow morning,   R        06/21/24 1927   06/22/24 0500  Hepatic function panel  Tomorrow morning,   R        06/21/24 1927   06/21/24 1649  Culture, blood (routine x 2)  BLOOD CULTURE X 2,   R      06/21/24 1648            Vitals/Pain Today's Vitals   06/21/24 1745 06/21/24 1815 06/21/24 1915 06/21/24 1931  BP: (!) 130/58 (!) 136/53 (!) 134/56   Pulse: 73 71 69   Resp: (!) 23 18 (!)  21   Temp:      TempSrc:      SpO2: 97% 96% 95%   Weight:      Height:      PainSc:    0-No pain    Isolation Precautions No active isolations  Medications Medications  sodium chloride  flush (NS) 0.9 % injection 3 mL (has no administration in time range)  acetaminophen  (TYLENOL ) tablet 500 mg  (has no administration in time range)  albuterol  (PROVENTIL ) (2.5 MG/3ML) 0.083% nebulizer solution 2.5 mg (has no administration in time range)  melatonin tablet 6 mg (has no administration in time range)  ondansetron  (ZOFRAN ) injection 4 mg (has no administration in time range)  polyethylene glycol (MIRALAX  / GLYCOLAX ) packet 17 g (has no administration in time range)  oxyCODONE  (Oxy IR/ROXICODONE ) immediate release tablet 2.5 mg (has no administration in time range)    Or  oxyCODONE  (Oxy IR/ROXICODONE ) immediate release tablet 5 mg (has no administration in time range)  ondansetron  (ZOFRAN ) injection 4 mg (4 mg Intravenous Given 06/21/24 1539)  sodium chloride  0.9 % bolus 1,000 mL (0 mLs Intravenous Stopped 06/21/24 1656)  iohexol  (OMNIPAQUE ) 350 MG/ML injection 75 mL (75 mLs Intravenous Contrast Given 06/21/24 1703)  cefTRIAXone  (ROCEPHIN ) 2 g in sodium chloride  0.9 % 100 mL IVPB (0 g Intravenous Stopped 06/21/24 1814)    And  metroNIDAZOLE  (FLAGYL ) IVPB 500 mg (0 mg Intravenous Stopped 06/21/24 1857)    Mobility walks     Focused Assessments GI assessment   R Recommendations: See Admitting Provider Note  Report given to:   Additional Notes:

## 2024-06-21 NOTE — ED Notes (Signed)
 Ultrasound has left the bedside.

## 2024-06-21 NOTE — ED Provider Notes (Signed)
 [place] EMERGENCY DEPARTMENT AT [facility] Provider Note   CSN: [id number] Arrival date & time: 06/21/24  1518     Patient presents with: nausea/vomiting recheck   [name] is a 89 y.o. female who presents for 1 week history of nausea and vomiting along with generalized weakness.  She was originally taken to [facility] on 3 July however due to wait times left before being seen.  Initial workup was completed at that time which did demonstrate an elevated AST and ALT.  Lipase at that time was within normal limits.  CBC was unremarkable.  She was referred back to our facility for reevaluation by her primary care due to continued emesis, elevated liver enzymes, and that she did have a fever of 101.6 yesterday.  Further reports multiple instances of watery stools, last time stating that she felt that this was clear liquid, with whitish mucus produced.  Decreased appetite secondary to persistent nausea, has been able to tolerate Pedialyte, soft easy to digest foods over the last 24 hours.  She has a previous medical history of COPD, right breast cancer, and depression/anxiety.   HPI     Prior to Admission medications   Medication Sig Start Date End Date Taking? Authorizing Provider  acetaminophen  (TYLENOL ) 650 MG CR tablet Take 650 mg by mouth 2 (two) times daily as needed for pain.     [provider]  albuterol  (PROVENTIL  HFA;VENTOLIN  HFA) 108 (90 Base) MCG/ACT inhaler Inhale 2 puffs into the lungs every 6 (six) hours as needed for wheezing or shortness of breath.    [provider]  aspirin  EC 81 MG tablet Take 81 mg by mouth daily.    [provider]  atorvastatin  (LIPITOR) 10 MG tablet Take 10 mg by mouth at bedtime.    [provider]  cariprazine (VRAYLAR) 3 MG capsule 1 capsule    [provider]  Cholecalciferol (VITAMIN D) 2000 units tablet Take 4,000 Units by mouth daily.     [provider]  escitalopram   (LEXAPRO ) 20 MG tablet Take 40 mg by mouth daily. 08/01/17   [provider]  fluticasone (FLONASE) 50 MCG/ACT nasal spray Place 2 sprays into both nostrils daily.    [provider]  Multiple Vitamin (MULTIVITAMIN WITH MINERALS) TABS tablet Take 1 tablet by mouth daily.    [provider]  pantoprazole  (PROTONIX ) 20 MG tablet Take 20 mg by mouth daily.    [provider]  Polyethyl Glycol-Propyl Glycol (SYSTANE) 0.4-0.3 % SOLN Place 1 drop into both eyes daily as needed (dry eyes).    [provider]  tiotropium (SPIRIVA) 18 MCG inhalation capsule Place 18 mcg into inhaler and inhale daily.    [provider]    Allergies: Adhesive [tape] and Latex    Review of Systems  Constitutional:  Positive for appetite change and fever.  Gastrointestinal:  Positive for abdominal pain, diarrhea and nausea.  Genitourinary:  Negative for dysuria and pelvic pain.  All other systems reviewed and are negative.   Updated Vital Signs BP (!) 136/53   Pulse 71   Temp 98.8 F (37.1 C) (Rectal)   Resp 18   Ht 5' 1 (1.549 m)   Wt 67.1 kg   SpO2 96%   BMI 27.96 kg/m   Physical Exam Vitals and nursing note reviewed.  Constitutional:      General: She is not in acute distress.    Appearance: Normal appearance.  HENT:  Head: Normocephalic and atraumatic.     Mouth/Throat:     Mouth: Mucous membranes are dry.     Pharynx: Oropharynx is clear.  Eyes:     Extraocular Movements: Extraocular movements intact.     Conjunctiva/sclera: Conjunctivae normal.     Pupils: Pupils are equal, round, and reactive to light.  Cardiovascular:     Rate and Rhythm: Normal rate and regular rhythm.     Pulses: Normal pulses.     Heart sounds: Normal heart sounds. No murmur heard.    No friction rub. No gallop.  Pulmonary:     Effort: Pulmonary effort is normal.     Breath sounds: Normal breath sounds and air entry.  Abdominal:     General: Abdomen is flat.  Bowel sounds are normal.     Palpations: Abdomen is soft.     Tenderness: There is generalized abdominal tenderness.  Musculoskeletal:        General: Normal range of motion.     Cervical back: Normal range of motion and neck supple.     Right lower leg: No edema.     Left lower leg: No edema.  Skin:    General: Skin is warm and dry.     Capillary Refill: Capillary refill takes less than 2 seconds.     Comments: Poor skin turgor is appreciated  Neurological:     General: No focal deficit present.     Mental Status: She is alert and oriented to person, place, and time. Mental status is at baseline.     GCS: GCS eye subscore is 4. GCS verbal subscore is 5. GCS motor subscore is 6.     Sensory: Sensation is intact.     Motor: Motor function is intact.  Psychiatric:        Mood and Affect: Mood normal.        Behavior: Behavior is cooperative.     (all labs ordered are listed, but only abnormal results are displayed) Labs Reviewed  CBC WITH DIFFERENTIAL/PLATELET - Abnormal; Notable for the following components:      Result Value   WBC 14.0 (*)    Platelets 102 (*)    Neutro Abs 11.8 (*)    Lymphs Abs 0.4 (*)    Abs Immature Granulocytes 1.07 (*)    All other components within normal limits  COMPREHENSIVE METABOLIC PANEL WITH GFR - Abnormal; Notable for the following components:   Sodium 133 (*)    Chloride 97 (*)    BUN 26 (*)    Total Protein 6.3 (*)    Albumin 3.2 (*)    AST 155 (*)    ALT 334 (*)    Total Bilirubin 1.3 (*)    GFR, Estimated 59 (*)    All other components within normal limits  URINALYSIS, ROUTINE W REFLEX MICROSCOPIC - Abnormal; Notable for the following components:   Hgb urine dipstick MODERATE (*)    Nitrite POSITIVE (*)    Bacteria, UA MANY (*)    All other components within normal limits  CULTURE, BLOOD (ROUTINE X 2)  CULTURE, BLOOD (ROUTINE X 2)  LIPASE, BLOOD  I-STAT CG4 LACTIC ACID, ED    EKG: None  Radiology: CT ABDOMEN PELVIS W  CONTRAST Result Date: 06/21/2024 CLINICAL DATA:  Abdomen pain EXAM: CT ABDOMEN AND PELVIS WITH CONTRAST TECHNIQUE: Multidetector CT imaging of the abdomen and pelvis was performed using the standard protocol following bolus administration of intravenous contrast. RADIATION DOSE REDUCTION: This exam was  performed according to the departmental dose-optimization program which includes automated exposure control, adjustment of the mA and/or kV according to patient size and/or use of iterative reconstruction technique. CONTRAST:  75mL OMNIPAQUE  IOHEXOL  350 MG/ML SOLN COMPARISON:  CT 03/14/2020 FINDINGS: Lower chest: Lung bases demonstrate mild subpleural scarring. No acute airspace disease. Pectus deformity of the lower anterior chest wall. Hepatobiliary: Distended gallbladder. No calcified stones. Possible trace pericholecystic fluid versus gallbladder wall thickening on coronal images. No significant biliary dilatation. Indeterminate hypodensity in the left hepatic lobe measuring about 13 mm on series 3, image 21. Pancreas: Atrophic.  No acute inflammation Spleen: Normal in size without focal abnormality. Adrenals/Urinary Tract: Adrenal glands are normal. Kidneys show slight prominence of collecting systems but no hydronephrosis or obstructing stone. Bladder is unremarkable Stomach/Bowel: The stomach is nonenlarged. No dilated small bowel. Diverticular disease of the left colon. No acute wall thickening Vascular/Lymphatic: Aortic atherosclerosis. No enlarged abdominal or pelvic lymph nodes. Reproductive: Status post hysterectomy. No adnexal masses. Other: Negative for pelvic effusion or free air. Musculoskeletal: No acute or suspicious osseous abnormality. Multilevel degenerative change IMPRESSION: 1. Distended gallbladder with possible trace pericholecystic fluid versus gallbladder wall thickening. Suggest correlation with right upper quadrant ultrasound. 2. Diverticular disease of the left colon without acute  inflammatory process. 3. Indeterminate 13 mm hypodensity in the left hepatic lobe. When the patient is clinically stable and able to follow directions and hold their breath (preferably as an outpatient) further evaluation with dedicated abdominal MRI should be considered if deemed clinically appropriate 4. Aortic atherosclerosis. Aortic Atherosclerosis (ICD10-I70.0). Electronically Signed   By: Luke Bun M.D.   On: 06/21/2024 17:23     Procedures   Medications Ordered in the ED  ondansetron  (ZOFRAN ) injection 4 mg (4 mg Intravenous Given 06/21/24 1539)  sodium chloride  0.9 % bolus 1,000 mL (0 mLs Intravenous Stopped 06/21/24 1656)  iohexol  (OMNIPAQUE ) 350 MG/ML injection 75 mL (75 mLs Intravenous Contrast Given 06/21/24 1703)  cefTRIAXone  (ROCEPHIN ) 2 g in sodium chloride  0.9 % 100 mL IVPB (0 g Intravenous Stopped 06/21/24 1814)    And  metroNIDAZOLE  (FLAGYL ) IVPB 500 mg (500 mg Intravenous New Bag/Given 06/21/24 1741)                                    Medical Decision Making Amount and/or Complexity of Data Reviewed Labs: ordered. Radiology: ordered.  Risk Prescription drug management. Decision regarding hospitalization.   Medical Decision Making:   [name] is a 89 y.o. female who presented to the ED today with persistent nausea and vomiting along with loose stools and generalized abdominal discomfort detailed above.    External chart has been reviewed including previously obtained lab results. Patient placed on continuous vitals and telemetry monitoring while in ED which was reviewed periodically.  Complete initial physical exam performed, notably the patient  was alert and oriented in no apparent distress.  Notably she does have poor skin turgor and dry oral mucosa..    Reviewed and confirmed nursing documentation for past medical history, family history, social history.    Initial Assessment:   With the patient's presentation of nausea and vomiting, most likely diagnosis is  gastroenteritis.  Differential also includes acute bowel obstruction, mesenteric ischemia, hepatobiliary disease.   Initial Plan:  Obtain CT of the abdomen to assess for acute intra-abdominal pathology Screening labs including CBC and Metabolic panel to evaluate for infectious or metabolic etiology of disease.  Assess serum lipase to address possible pancreatic etiology Urinalysis with reflex culture ordered to evaluate for UTI or relevant urologic/nephrologic pathology.  EKG to evaluate for cardiac pathology Provide 1 L fluid bolus secondary to findings of poor skin turgor and oral mucosa suggesting fluid volume deficit. Provide IV ondansetron  to manage nausea Objective evaluation as below reviewed   Initial Study Results:   Laboratory  All laboratory results reviewed without evidence of clinically relevant pathology.   Exceptions include: Leukocytosis of 14 with a neutrophilia of 11.8 and noted bandemia.  BUN is elevated to 26 with a GFR decreased to 59, AST is 155 and ALT is 334 (this is decreased compared to 2 days ago), urine shows a positive nitrite, bacteriuria, and positive hemoglobin.   Radiology:  All images reviewed independently. Agree with radiology report at this time.   CT ABDOMEN PELVIS W CONTRAST Result Date: 06/21/2024 CLINICAL DATA:  Abdomen pain EXAM: CT ABDOMEN AND PELVIS WITH CONTRAST TECHNIQUE: Multidetector CT imaging of the abdomen and pelvis was performed using the standard protocol following bolus administration of intravenous contrast. RADIATION DOSE REDUCTION: This exam was performed according to the departmental dose-optimization program which includes automated exposure control, adjustment of the mA and/or kV according to patient size and/or use of iterative reconstruction technique. CONTRAST:  75mL OMNIPAQUE  IOHEXOL  350 MG/ML SOLN COMPARISON:  CT 03/14/2020 FINDINGS: Lower chest: Lung bases demonstrate mild subpleural scarring. No acute airspace disease. Pectus  deformity of the lower anterior chest wall. Hepatobiliary: Distended gallbladder. No calcified stones. Possible trace pericholecystic fluid versus gallbladder wall thickening on coronal images. No significant biliary dilatation. Indeterminate hypodensity in the left hepatic lobe measuring about 13 mm on series 3, image 21. Pancreas: Atrophic.  No acute inflammation Spleen: Normal in size without focal abnormality. Adrenals/Urinary Tract: Adrenal glands are normal. Kidneys show slight prominence of collecting systems but no hydronephrosis or obstructing stone. Bladder is unremarkable Stomach/Bowel: The stomach is nonenlarged. No dilated small bowel. Diverticular disease of the left colon. No acute wall thickening Vascular/Lymphatic: Aortic atherosclerosis. No enlarged abdominal or pelvic lymph nodes. Reproductive: Status post hysterectomy. No adnexal masses. Other: Negative for pelvic effusion or free air. Musculoskeletal: No acute or suspicious osseous abnormality. Multilevel degenerative change IMPRESSION: 1. Distended gallbladder with possible trace pericholecystic fluid versus gallbladder wall thickening. Suggest correlation with right upper quadrant ultrasound. 2. Diverticular disease of the left colon without acute inflammatory process. 3. Indeterminate 13 mm hypodensity in the left hepatic lobe. When the patient is clinically stable and able to follow directions and hold their breath (preferably as an outpatient) further evaluation with dedicated abdominal MRI should be considered if deemed clinically appropriate 4. Aortic atherosclerosis. Aortic Atherosclerosis (ICD10-I70.0). Electronically Signed   By: Luke Bun M.D.   On: 06/21/2024 17:23      Consults: Case discussed with J.Segars, MD with hospitalist team.   Reassessment and Plan:   Patient assessment of this patient, with elevated white count and associated bandemia, findings of bacteria and positive nitrates on UA, will begin IV antibiotics  and consult for admission for continued sepsis management.  Also, along with CT findings of gallbladder wall thickening, and recommendation for ultrasound of the right upper quadrant, this has been ordered for further assessment.  Consulted with hospitalist team who agrees to accept this patient for admission for continued IV antibiotics and continued workup.       Final diagnoses:  Diverticulitis  Sepsis, due to unspecified organism, unspecified whether acute organ dysfunction present [facility])    ED  Discharge Orders     None          Myriam Dorn BROCKS, [place] 06/21/24 1918    Armenta Canning, MD 06/26/24 1501

## 2024-06-21 NOTE — ED Triage Notes (Signed)
 Pt bib ems from home with reports of NV weakness for the last week. Seen at [facility] for same several days ago, left AMA. AST and ALT elevated, told by PCP to come back to the hospital.  Last emesis yesterday. Endorses upper abdominal pain and fevers, TMAX 101.6.

## 2024-06-22 ENCOUNTER — Inpatient Hospital Stay (HOSPITAL_COMMUNITY)

## 2024-06-22 DIAGNOSIS — R1011 Right upper quadrant pain: Secondary | ICD-10-CM

## 2024-06-22 DIAGNOSIS — R7989 Other specified abnormal findings of blood chemistry: Secondary | ICD-10-CM | POA: Diagnosis not present

## 2024-06-22 DIAGNOSIS — E876 Hypokalemia: Secondary | ICD-10-CM

## 2024-06-22 LAB — CBC
HCT: 32.7 % — ABNORMAL LOW (ref 36.0–46.0)
Hemoglobin: 11 g/dL — ABNORMAL LOW (ref 12.0–15.0)
MCH: 30.8 pg (ref 26.0–34.0)
MCHC: 33.6 g/dL (ref 30.0–36.0)
MCV: 91.6 fL (ref 80.0–100.0)
Platelets: 92 K/uL — ABNORMAL LOW (ref 150–400)
RBC: 3.57 MIL/uL — ABNORMAL LOW (ref 3.87–5.11)
RDW: 13.3 % (ref 11.5–15.5)
WBC: 10 K/uL (ref 4.0–10.5)
nRBC: 0 % (ref 0.0–0.2)

## 2024-06-22 LAB — BLOOD CULTURE ID PANEL (REFLEXED) - BCID2

## 2024-06-22 LAB — BASIC METABOLIC PANEL WITH GFR
Anion gap: 8 (ref 5–15)
BUN: 14 mg/dL (ref 8–23)
CO2: 23 mmol/L (ref 22–32)
Calcium: 8 mg/dL — ABNORMAL LOW (ref 8.9–10.3)
Chloride: 104 mmol/L (ref 98–111)
Creatinine, Ser: 0.76 mg/dL (ref 0.44–1.00)
GFR, Estimated: >60 mL/min (ref >60)
Glucose, Bld: 100 mg/dL — ABNORMAL HIGH (ref 70–99)
Potassium: 3.3 mmol/L — ABNORMAL LOW (ref 3.5–5.1)
Sodium: 135 mmol/L (ref 135–145)

## 2024-06-22 LAB — HEPATIC FUNCTION PANEL
ALT: 223 U/L — ABNORMAL HIGH (ref 0–44)
AST: 73 U/L — ABNORMAL HIGH (ref 15–41)
Albumin: 2.7 g/dL — ABNORMAL LOW (ref 3.5–5.0)
Alkaline Phosphatase: 89 U/L (ref 38–126)
Bilirubin, Direct: 0.2 mg/dL (ref 0.0–0.2)
Indirect Bilirubin: 0.7 mg/dL (ref 0.3–0.9)
Total Bilirubin: 0.9 mg/dL (ref 0.0–1.2)
Total Protein: 5.3 g/dL — ABNORMAL LOW (ref 6.5–8.1)

## 2024-06-22 LAB — MAGNESIUM: Magnesium: 1.8 mg/dL (ref 1.7–2.4)

## 2024-06-22 LAB — PHOSPHORUS: Phosphorus: 2.6 mg/dL (ref 2.5–4.6)

## 2024-06-22 MED ORDER — MAGNESIUM SULFATE 2 GM/50ML IV SOLN
2.0000 g | Freq: Once | INTRAVENOUS | Status: AC
  Administered 2024-06-22: 2 g via INTRAVENOUS
  Filled 2024-06-22: qty 50

## 2024-06-22 MED ORDER — LORAZEPAM 2 MG/ML IJ SOLN
0.5000 mg | Freq: Once | INTRAMUSCULAR | Status: AC
  Administered 2024-06-22: 0.5 mg via INTRAVENOUS
  Filled 2024-06-22: qty 1

## 2024-06-22 MED ORDER — POTASSIUM CHLORIDE CRYS ER 20 MEQ PO TBCR
40.0000 meq | EXTENDED_RELEASE_TABLET | Freq: Once | ORAL | Status: AC
  Administered 2024-06-22: 40 meq via ORAL
  Filled 2024-06-22: qty 2

## 2024-06-22 MED ORDER — GADOBUTROL 1 MMOL/ML IV SOLN
7.0000 mL | Freq: Once | INTRAVENOUS | Status: AC | PRN
  Administered 2024-06-22: 7 mL via INTRAVENOUS

## 2024-06-22 NOTE — Progress Notes (Signed)
 Patient is a new admit at 2206. She is alert and oriented x4. Accompanied by daughter. Skin is intact. Vitals WNL. Pt reports continence of bowel and bladder. Generalized weakness and difficulty ambulating. No signs of acute distress at this time.

## 2024-06-22 NOTE — Progress Notes (Signed)
 PT Cancellation Note  Patient Details Name: [name] MRN: [id number] DOB: [birth year]   Cancelled Treatment:    Reason Eval/Treat Not Completed: Patient declined, no reason specified, pt declined reporting she needs to eat prior to therapy evaluation and requested PT return in the morning. Pt family present and reports pt actively hallucinating after ativan  given for MRI, also requesting PT return in morning for full evaluation.   [name], PT, DPT   Acute Rehabilitation Department Office [phone] Secure Chat Communication Preferred   [name] 06/22/2024, 4:58 PM

## 2024-06-22 NOTE — Consult Note (Signed)
 Reason for Consult:abd pain Referring Physician: Dr. Verdene Dux [name] is an 89 y.o. female.  HPI: The patient is an 89 year old white female who presents with abdominal pain that started about 3 or 4 days ago.  The pain has gotten more severe during that time.  The pain is located in her right upper quadrant.  The pain has been associated with significant nausea and vomiting.  She came to the emergency department where an ultrasound does show thickening of the gallbladder wall but no gallstones.  Her liver functions are elevated.  Past Medical History:  Diagnosis Date   Anxiety    Back pain    Cancer (HCC)    Depression    Emphysema    Esophageal spasm    Lung nodule    OSA (obstructive sleep apnea)    Osteomalacia    Personal history of radiation therapy    Vertigo    Vitamin D deficiency     Past Surgical History:  Procedure Laterality Date   ABDOMINAL HYSTERECTOMY     APPENDECTOMY     BREAST BIOPSY Right 08/31/2020   BREAST EXCISIONAL BIOPSY Left 1978   BREAST LUMPECTOMY Right 09/30/2020   BREAST LUMPECTOMY WITH RADIOACTIVE SEED LOCALIZATION Right 09/30/2020   Procedure: RIGHT BREAST LUMPECTOMY WITH RADIOACTIVE SEED LOCALIZATION;  Surgeon: Curvin Deward MOULD, MD;  Location: [facility];  Service: General;  Laterality: Right;   CARPAL TUNNEL RELEASE     ROTATOR CUFF REPAIR      Family History  Problem Relation Age of Onset   Diabetes type II Other    Hypertension Other    Breast cancer Maternal Aunt        dx early 82s   Other Mother        died of natural causes at age 20   Other Father        stroke or heart attack while in shower   Diabetes type II Sister    Cancer Paternal Uncle        unknown type; dx early 72s    Social History:  reports that she quit smoking about 8 years ago. Her smoking use included cigarettes. She started smoking about 64 years ago. She has a 56 pack-year smoking history. She has never used smokeless tobacco. She  reports that she does not drink alcohol and does not use drugs.  Allergies:  Allergies  Allergen Reactions   Adhesive [Tape] Itching and Other (See Comments)    Little red itchy bumps develop   Latex Itching and Other (See Comments)    Little red itchy bumps develop    Medications: I have reviewed the patient's current medications.  Results for orders placed or performed during the hospital encounter of 06/21/24 (from the past 48 hours)  CBC with Differential     Status: Abnormal   Collection Time: 06/21/24  3:32 PM  Result Value Ref Range   WBC 14.0 (H) 4.0 - 10.5 K/uL   RBC 4.01 3.87 - 5.11 MIL/uL   Hemoglobin 13.0 12.0 - 15.0 g/dL   HCT 62.6 63.9 - 53.9 %   MCV 93.0 80.0 - 100.0 fL   MCH 32.4 26.0 - 34.0 pg   MCHC 34.9 30.0 - 36.0 g/dL   RDW 86.5 88.4 - 84.4 %   Platelets 102 (L) 150 - 400 K/uL    Comment: SPECIMEN CHECKED FOR CLOTS Immature Platelet Fraction may be clinically indicated, consider ordering this additional test [id number] REPEATED TO VERIFY PLATELET  COUNT CONFIRMED BY SMEAR    nRBC 0.0 0.0 - 0.2 %   Neutrophils Relative % 84 %   Neutro Abs 11.8 (H) 1.7 - 7.7 K/uL   Lymphocytes Relative 3 %   Lymphs Abs 0.4 (L) 0.7 - 4.0 K/uL   Monocytes Relative 4 %   Monocytes Absolute 0.5 0.1 - 1.0 K/uL   Eosinophils Relative 1 %   Eosinophils Absolute 0.2 0.0 - 0.5 K/uL   Basophils Relative 0 %   Basophils Absolute 0.1 0.0 - 0.1 K/uL   WBC Morphology INCREASED BANDS (>20% BANDS)    RBC Morphology MORPHOLOGY UNREMARKABLE    Smear Review PLATELETS APPEAR DECREASED    Immature Granulocytes 8 %   Abs Immature Granulocytes 1.07 (H) 0.00 - 0.07 K/uL    Comment: Performed at [facility], [address], [place], [place] [address]  Comprehensive metabolic panel     Status: Abnormal   Collection Time: 06/21/24  3:32 PM  Result Value Ref Range   Sodium 133 (L) 135 - 145 mmol/L   Potassium 3.7 3.5 - 5.1 mmol/L   Chloride 97 (L) 98 - 111 mmol/L   CO2 25 22 - 32  mmol/L   Glucose, Bld 99 70 - 99 mg/dL    Comment: Glucose reference range applies only to samples taken after fasting for at least 8 hours.   BUN 26 (H) 8 - 23 mg/dL   Creatinine, Ser 9.06 0.44 - 1.00 mg/dL   Calcium  8.9 8.9 - 10.3 mg/dL   Total Protein 6.3 (L) 6.5 - 8.1 g/dL   Albumin 3.2 (L) 3.5 - 5.0 g/dL   AST 844 (H) 15 - 41 U/L   ALT 334 (H) 0 - 44 U/L   Alkaline Phosphatase 103 38 - 126 U/L   Total Bilirubin 1.3 (H) 0.0 - 1.2 mg/dL   GFR, Estimated 59 (L) >60 mL/min    Comment: (NOTE) Calculated using the CKD-EPI Creatinine Equation (2021)    Anion gap 11 5 - 15    Comment: Performed at [facility], [address], [place], [place] [address]  Lipase, blood     Status: None   Collection Time: 06/21/24  3:32 PM  Result Value Ref Range   Lipase 27 11 - 51 U/L    Comment: Performed at [facility], [address], [place], [place] [address]  Urinalysis, Routine w reflex microscopic -Urine, Clean Catch     Status: Abnormal   Collection Time: 06/21/24  3:32 PM  Result Value Ref Range   Color, Urine YELLOW YELLOW   APPearance CLEAR CLEAR   Specific Gravity, Urine 1.013 1.005 - 1.030   pH 7.0 5.0 - 8.0   Glucose, UA NEGATIVE NEGATIVE mg/dL   Hgb urine dipstick MODERATE (A) NEGATIVE   Bilirubin Urine NEGATIVE NEGATIVE   Ketones, ur NEGATIVE NEGATIVE mg/dL   Protein, ur NEGATIVE NEGATIVE mg/dL   Nitrite POSITIVE (A) NEGATIVE   Leukocytes,Ua NEGATIVE NEGATIVE   RBC / HPF 6-10 0 - 5 RBC/hpf   WBC, UA 0-5 0 - 5 WBC/hpf   Bacteria, UA MANY (A) NONE SEEN   Squamous Epithelial / HPF 0-5 0 - 5 /HPF   Mucus PRESENT     Comment: Performed at [facility], [address], [place], [place] [address]  Culture, blood (routine x 2)     Status: None (Preliminary result)   Collection Time: 06/21/24  4:49 PM   Specimen: BLOOD RIGHT FOREARM  Result Value Ref Range   Specimen Description BLOOD RIGHT FOREARM    Special Requests      BOTTLES DRAWN AEROBIC AND ANAEROBIC  Blood Culture adequate volume   Culture  Setup Time      AEROBIC BOTTLE ONLY GRAM NEGATIVE RODS CRITICAL RESULT CALLED TO, READ BACK BY AND VERIFIED WITH: MAYA RANDALL CALL [phone] AT 0955 BY EC Performed at [facility], [address], [place], [place] [address]    Culture GRAM NEGATIVE RODS    Report Status PENDING   Blood Culture ID Panel (Reflexed)     Status: Abnormal   Collection Time: 06/21/24  4:49 PM  Result Value Ref Range   Enterococcus faecalis NOT DETECTED NOT DETECTED   Enterococcus Faecium NOT DETECTED NOT DETECTED   Listeria monocytogenes NOT DETECTED NOT DETECTED   Staphylococcus species NOT DETECTED NOT DETECTED   Staphylococcus aureus (BCID) NOT DETECTED NOT DETECTED   Staphylococcus epidermidis NOT DETECTED NOT DETECTED   Staphylococcus lugdunensis NOT DETECTED NOT DETECTED   Streptococcus species NOT DETECTED NOT DETECTED   Streptococcus agalactiae NOT DETECTED NOT DETECTED   Streptococcus pneumoniae NOT DETECTED NOT DETECTED   Streptococcus pyogenes NOT DETECTED NOT DETECTED   A.calcoaceticus-baumannii NOT DETECTED NOT DETECTED   Bacteroides fragilis NOT DETECTED NOT DETECTED   Enterobacterales DETECTED (A) NOT DETECTED    Comment: Enterobacterales represent a large order of gram negative bacteria, not a single organism. CRITICAL RESULT CALLED TO, READ BACK BY AND VERIFIED WITH: PHARMD JENNY ZHOU [phone] AT 0955 BY EC    Enterobacter cloacae complex NOT DETECTED NOT DETECTED   Escherichia coli DETECTED (A) NOT DETECTED    Comment: CRITICAL RESULT CALLED TO, READ BACK BY AND VERIFIED WITH: PHARMD JENNY ZHOU [phone] AT 0955 BY EC    Klebsiella aerogenes NOT DETECTED NOT DETECTED   Klebsiella oxytoca NOT DETECTED NOT DETECTED   Klebsiella pneumoniae NOT DETECTED NOT DETECTED   Proteus species NOT DETECTED NOT DETECTED   Salmonella species NOT DETECTED NOT DETECTED   Serratia marcescens NOT DETECTED NOT DETECTED   Haemophilus influenzae NOT  DETECTED NOT DETECTED   Neisseria meningitidis NOT DETECTED NOT DETECTED   Pseudomonas aeruginosa NOT DETECTED NOT DETECTED   Stenotrophomonas maltophilia NOT DETECTED NOT DETECTED   Candida albicans NOT DETECTED NOT DETECTED   Candida auris NOT DETECTED NOT DETECTED   Candida glabrata NOT DETECTED NOT DETECTED   Candida krusei NOT DETECTED NOT DETECTED   Candida parapsilosis NOT DETECTED NOT DETECTED   Candida tropicalis NOT DETECTED NOT DETECTED   Cryptococcus neoformans/gattii NOT DETECTED NOT DETECTED   CTX-M ESBL NOT DETECTED NOT DETECTED   Carbapenem resistance IMP NOT DETECTED NOT DETECTED   Carbapenem resistance KPC NOT DETECTED NOT DETECTED   Carbapenem resistance NDM NOT DETECTED NOT DETECTED   Carbapenem resist OXA 48 LIKE NOT DETECTED NOT DETECTED   Carbapenem resistance VIM NOT DETECTED NOT DETECTED    Comment: Performed at [facility], [address] [place], [place] [address]  Culture, blood (routine x 2)     Status: None (Preliminary result)   Collection Time: 06/21/24  4:54 PM   Specimen: BLOOD  Result Value Ref Range   Specimen Description BLOOD RIGHT ANTECUBITAL    Special Requests      BOTTLES DRAWN AEROBIC AND ANAEROBIC Blood Culture results may not be optimal due to an inadequate volume of blood received in culture bottles   Culture      NO GROWTH < 24 HOURS Performed at  [facility], [address], [place], [place] [address]    Report Status PENDING   I-Stat Lactic Acid     Status: None   Collection Time: 06/21/24  5:36 PM  Result Value Ref Range   Lactic Acid, Venous 0.8 0.5 - 1.9 mmol/L  Basic metabolic panel with GFR     Status: Abnormal   Collection Time: 06/22/24  5:10 AM  Result Value Ref Range   Sodium 135 135 - 145 mmol/L   Potassium 3.3 (L) 3.5 - 5.1 mmol/L   Chloride 104 98 - 111 mmol/L   CO2 23 22 - 32 mmol/L   Glucose, Bld 100 (H) 70 - 99 mg/dL    Comment: Glucose reference range applies only to samples taken after fasting  for at least 8 hours.   BUN 14 8 - 23 mg/dL   Creatinine, Ser 9.23 0.44 - 1.00 mg/dL   Calcium  8.0 (L) 8.9 - 10.3 mg/dL   GFR, Estimated >39 >39 mL/min    Comment: (NOTE) Calculated using the CKD-EPI Creatinine Equation (2021)    Anion gap 8 5 - 15    Comment: Performed at [facility], [address], [place], [place] [address]  CBC     Status: Abnormal   Collection Time: 06/22/24  5:10 AM  Result Value Ref Range   WBC 10.0 4.0 - 10.5 K/uL   RBC 3.57 (L) 3.87 - 5.11 MIL/uL   Hemoglobin 11.0 (L) 12.0 - 15.0 g/dL   HCT 67.2 (L) 63.9 - 53.9 %   MCV 91.6 80.0 - 100.0 fL   MCH 30.8 26.0 - 34.0 pg   MCHC 33.6 30.0 - 36.0 g/dL   RDW 86.6 88.4 - 84.4 %   Platelets 92 (L) 150 - 400 K/uL    Comment: SPECIMEN CHECKED FOR CLOTS Immature Platelet Fraction may be clinically indicated, consider ordering this additional test [id number] REPEATED TO VERIFY    nRBC 0.0 0.0 - 0.2 %    Comment: Performed at [facility], [address], [place], [place] [address]  Magnesium      Status: None   Collection Time: 06/22/24  5:10 AM  Result Value Ref Range   Magnesium  1.8 1.7 - 2.4 mg/dL    Comment: Performed at [facility], [address], [place], [place] [address]  Phosphorus     Status: None   Collection Time: 06/22/24  5:10 AM  Result Value Ref Range   Phosphorus 2.6 2.5 - 4.6 mg/dL    Comment: Performed at [facility], [address], [place], [place] [address]  Hepatic function panel     Status: Abnormal   Collection Time: 06/22/24  5:10 AM  Result Value Ref Range   Total Protein 5.3 (L) 6.5 - 8.1 g/dL   Albumin 2.7 (L) 3.5 - 5.0 g/dL   AST 73 (H) 15 - 41 U/L   ALT 223 (H) 0 - 44 U/L   Alkaline Phosphatase 89 38 - 126 U/L   Total Bilirubin 0.9 0.0 - 1.2 mg/dL   Bilirubin, Direct 0.2 0.0 - 0.2 mg/dL   Indirect Bilirubin 0.7 0.3 - 0.9 mg/dL    Comment: Performed at [facility], [address], [place], [place] [address]    US  Abdomen Limited RUQ  (LIVER/GB) Result Date: 06/21/2024 CLINICAL DATA:  Upper abdominal pain EXAM: ULTRASOUND ABDOMEN LIMITED RIGHT UPPER QUADRANT COMPARISON:  CT 06/21/2024 FINDINGS: Gallbladder: No shadowing stones. Gallbladder distension. Focal wall  thickening measuring up to 8 mm at the fundus. Negative sonographic Murphy. Common bile duct: Diameter: 7.1 mm Liver: Echogenic nodule left hepatic lobe measuring 14 x 14 x 15 mm likely corresponding to hypodensity on CT. Portal vein is patent on color Doppler imaging with normal direction of blood flow towards the liver. Other: None. IMPRESSION: 1. Gallbladder distension with focal echogenic wall thickening at the fundus. No shadowing stones. Negative sonographic Murphy. Findings are nonspecific and could be due to focal gallbladder wall inflammatory process. Gallbladder wall thickening secondary to neoplasm is possible but no masslike features on previously performed CT. 2. Common bile duct upper normal at 7.1 mm. Correlate with LFTs. 3. 15 mm echogenic nodule in the left hepatic lobe likely corresponding to hypodensity on CT, possible hemangioma. This could be confirmed with nonemergent MRI if desired given patient age. Electronically Signed   By: Luke Bun M.D.   On: 06/21/2024 21:13   CT ABDOMEN PELVIS W CONTRAST Result Date: 06/21/2024 CLINICAL DATA:  Abdomen pain EXAM: CT ABDOMEN AND PELVIS WITH CONTRAST TECHNIQUE: Multidetector CT imaging of the abdomen and pelvis was performed using the standard protocol following bolus administration of intravenous contrast. RADIATION DOSE REDUCTION: This exam was performed according to the departmental dose-optimization program which includes automated exposure control, adjustment of the mA and/or kV according to patient size and/or use of iterative reconstruction technique. CONTRAST:  75mL OMNIPAQUE  IOHEXOL  350 MG/ML SOLN COMPARISON:  CT 03/14/2020 FINDINGS: Lower chest: Lung bases demonstrate mild subpleural scarring. No acute airspace  disease. Pectus deformity of the lower anterior chest wall. Hepatobiliary: Distended gallbladder. No calcified stones. Possible trace pericholecystic fluid versus gallbladder wall thickening on coronal images. No significant biliary dilatation. Indeterminate hypodensity in the left hepatic lobe measuring about 13 mm on series 3, image 21. Pancreas: Atrophic.  No acute inflammation Spleen: Normal in size without focal abnormality. Adrenals/Urinary Tract: Adrenal glands are normal. Kidneys show slight prominence of collecting systems but no hydronephrosis or obstructing stone. Bladder is unremarkable Stomach/Bowel: The stomach is nonenlarged. No dilated small bowel. Diverticular disease of the left colon. No acute wall thickening Vascular/Lymphatic: Aortic atherosclerosis. No enlarged abdominal or pelvic lymph nodes. Reproductive: Status post hysterectomy. No adnexal masses. Other: Negative for pelvic effusion or free air. Musculoskeletal: No acute or suspicious osseous abnormality. Multilevel degenerative change IMPRESSION: 1. Distended gallbladder with possible trace pericholecystic fluid versus gallbladder wall thickening. Suggest correlation with right upper quadrant ultrasound. 2. Diverticular disease of the left colon without acute inflammatory process. 3. Indeterminate 13 mm hypodensity in the left hepatic lobe. When the patient is clinically stable and able to follow directions and hold their breath (preferably as an outpatient) further evaluation with dedicated abdominal MRI should be considered if deemed clinically appropriate 4. Aortic atherosclerosis. Aortic Atherosclerosis (ICD10-I70.0). Electronically Signed   By: Luke Bun M.D.   On: 06/21/2024 17:23    Review of Systems  Constitutional: Negative.   HENT: Negative.    Eyes: Negative.   Respiratory: Negative.    Cardiovascular: Negative.   Gastrointestinal:  Positive for abdominal pain, nausea and vomiting.  Endocrine: Negative.    Genitourinary: Negative.   Musculoskeletal: Negative.   Skin: Negative.   Allergic/Immunologic: Negative.   Neurological: Negative.   Hematological: Negative.   Psychiatric/Behavioral: Negative.     Blood pressure 138/60, pulse 76, temperature 98.3 F (36.8 C), temperature source Oral, resp. rate 16, height 5' 1 (1.549 m), weight 67.9 kg, SpO2 90%. Physical Exam Vitals reviewed.  Constitutional:  General: She is not in acute distress.    Appearance: Normal appearance.  HENT:     Head: Normocephalic and atraumatic.     Right Ear: External ear normal.     Left Ear: External ear normal.     Nose: Nose normal.     Mouth/Throat:     Mouth: Mucous membranes are moist.     Pharynx: Oropharynx is clear.  Eyes:     General: No scleral icterus.    Extraocular Movements: Extraocular movements intact.     Conjunctiva/sclera: Conjunctivae normal.     Pupils: Pupils are equal, round, and reactive to light.  Cardiovascular:     Rate and Rhythm: Normal rate and regular rhythm.     Pulses: Normal pulses.     Heart sounds: Normal heart sounds.  Pulmonary:     Effort: Pulmonary effort is normal. No respiratory distress.     Breath sounds: Normal breath sounds.  Abdominal:     General: Abdomen is flat.     Palpations: Abdomen is soft.     Comments: There is moderate focal RUQ tenderness  Musculoskeletal:        General: No swelling or deformity. Normal range of motion.     Cervical back: Normal range of motion and neck supple.  Skin:    General: Skin is warm and dry.     Coloration: Skin is not jaundiced.  Neurological:     General: No focal deficit present.     Mental Status: She is alert and oriented to person, place, and time.  Psychiatric:        Mood and Affect: Mood normal.        Behavior: Behavior normal.     Assessment/Plan: The patient appears to have possible acalculous cholecystitis.  At this point I would agree with admission to the hospital for IV hydration and  broad-spectrum antibiotic therapy.  Given her liver function elevation it may be reasonable to get an MRCP to rule out common duct stone.  She may benefit from removal of the gallbladder during this admission.  We will discuss this with the primary team and continue her workup.  Deward Null III 06/22/2024, 10:18 AM

## 2024-06-22 NOTE — Progress Notes (Addendum)
 [facility] PROGRESS NOTE   [name] FMW:[id number] DOB: 12/22/1931 DOA: 06/21/2024  PCP: [name], MD  Brief History: 89 y.o. female with hx of COPD, PAD, hyperlipidemia, OSA, right breast invasive ductal carcinoma stage Ia s/p lumpectomy, radiation, hormone therapy, who presented with right upper quadrant abdominal pain radiating to the back and nausea and vomiting.  Patient was found to have abnormal LFTs.  She was hospitalized for further management.    Consultants: General Surgery has been consulted  Procedures: None yet    Subjective/Interval History: Patient still complains of right-sided abdominal pain with some nausea but has not vomited since yesterday.  Pain is a 5 out of 10 in intensity.  Her daughter is at the bedside.    Assessment/Plan:  Abdominal pain/abnormal LFTs/concern for cholecystitis LFTs noted to be improving.  Bilirubin was elevated and now noted to be normal.  Alkaline phosphatase levels noted to be normal.  Imaging studies raises concern for cholecystitis.  Right upper quadrant ultrasound was nonspecific.  No gallstones were noted.  CBD was noted to be upper limit of normal.  A left hepatic lobe lesion was noted thought to be hemangioma.  MRI may be needed. Patient is still quite tender in the right upper quadrant.  I consulted general surgery to assist with management. May need further imaging studies including HIDA or MRI. Will wait for surgical opinion. Will order hepatitis panel if not done so yet. Continue with ceftriaxone  and metronidazole .  Hyponatremia Resolved  Hypokalemia Will be supplemented.  Magnesium  is 1.8 and will also be supplemented.  Abnormal UA No symptoms of dysuria.  Is on ceftriaxone .  Continue to monitor.    History of COPD Respiratory status is stable.  Peripheral artery disease Holding aspirin .  Holding statin.  History of right breast carcinoma She is status post lumpectomy radiation and hormone  treatment and is in remission.  Normocytic anemia/thrombocytopenia Could be related to acute illness.  Check anemia panel.  Monitor platelet counts daily.   DVT Prophylaxis: SCDs Code Status: Full code Family Communication: Discussed with patient and her daughter Disposition Plan: To be determined  Status is: Inpatient Remains inpatient appropriate because: Abnormal LFTs, abdominal pain    Medications: Scheduled:  buPROPion   150 mg Oral Daily   escitalopram   40 mg Oral Daily   pantoprazole   40 mg Oral Daily   sodium chloride  flush  3 mL Intravenous Q12H   umeclidinium bromide   1 puff Inhalation Daily   Continuous:  sodium chloride  100 mL/hr at 06/22/24 0645   cefTRIAXone  (ROCEPHIN )  IV     metronidazole  Stopped (06/22/24 0529)   PRN:acetaminophen , albuterol , melatonin, ondansetron  (ZOFRAN ) IV, oxyCODONE  **OR** oxyCODONE , polyethylene glycol  Antibiotics: Anti-infectives (From admission, onward)    Start     Dose/Rate Route Frequency Ordered Stop   06/22/24 1700  cefTRIAXone  (ROCEPHIN ) 2 g in sodium chloride  0.9 % 100 mL IVPB        2 g 200 mL/hr over 30 Minutes Intravenous Every 24 hours 06/21/24 1932     06/22/24 0500  metroNIDAZOLE  (FLAGYL ) IVPB 500 mg        500 mg 100 mL/hr over 60 Minutes Intravenous Every 12 hours 06/21/24 1932     06/21/24 1745  cefTRIAXone  (ROCEPHIN ) 2 g in sodium chloride  0.9 % 100 mL IVPB       Placed in And Linked Group   2 g 200 mL/hr over 30 Minutes Intravenous  Once 06/21/24 1730 06/21/24 1814   06/21/24 1745  metroNIDAZOLE  (FLAGYL ) IVPB 500 mg       Placed in And Linked Group   500 mg 100 mL/hr over 60 Minutes Intravenous  Once 06/21/24 1730 06/21/24 1857       Objective:  Vital Signs  Vitals:   06/22/24 0437 06/22/24 0707 06/22/24 0716 06/22/24 0903  BP: (!) 155/65   138/60  Pulse: 76 75  76  Resp: 16     Temp: 99.5 F (37.5 C)   98.3 F (36.8 C)  TempSrc: Oral   Oral  SpO2: (!) 89% 93% 94% 90%  Weight:       Height:        Intake/Output Summary (Last 24 hours) at 06/22/2024 0906 Last data filed at 06/22/2024 0757 Gross per 24 hour  Intake 1120.47 ml  Output 450 ml  Net 670.47 ml   Filed Weights   06/21/24 1525 06/21/24 2052  Weight: 67.1 kg 67.9 kg    General appearance: Awake alert.  In no distress Resp: Clear to auscultation bilaterally.  Normal effort Cardio: S1-S2 is normal regular.  No S3-S4.  No rubs murmurs or bruit GI: Abdomen is soft.  Tender in the upper abdomen specifically in the right upper quadrant without any rebound rigidity or guarding.  No masses organomegaly. Extremities: No edema.  Full range of motion of lower extremities. Neurologic: Alert and oriented x3.  No focal neurological deficits.    Lab Results:  Data Reviewed: I have personally reviewed following labs and reports of the imaging studies  CBC: Recent Labs  Lab 06/19/24 1918 06/21/24 1532 06/22/24 0510  WBC 4.6 14.0* 10.0  NEUTROABS  --  11.8*  --   HGB 13.8 13.0 11.0*  HCT 41.0 37.3 32.7*  MCV 93.8 93.0 91.6  PLT 122* 102* 92*    Basic Metabolic Panel: Recent Labs  Lab 06/19/24 1918 06/21/24 1532 06/22/24 0510  NA 138 133* 135  K 3.5 3.7 3.3*  CL 102 97* 104  CO2 21* 25 23  GLUCOSE 126* 99 100*  BUN 15 26* 14  CREATININE 0.87 0.93 0.76  CALCIUM  8.7* 8.9 8.0*  MG  --   --  1.8  PHOS  --   --  2.6    GFR: Estimated Creatinine Clearance: 42 mL/min (by C-G formula based on SCr of 0.76 mg/dL).  Liver Function Tests: Recent Labs  Lab 06/19/24 1918 06/21/24 1532 06/22/24 0510  AST 1,658* 155* 73*  ALT 958* 334* 223*  ALKPHOS 117 103 89  BILITOT 3.2* 1.3* 0.9  PROT 6.4* 6.3* 5.3*  ALBUMIN 3.8 3.2* 2.7*    Recent Labs  Lab 06/19/24 1918 06/21/24 1532  LIPASE 31 27     Recent Results (from the past 240 hours)  Resp panel by RT-PCR (RSV, Flu A&B, Covid) Anterior Nasal Swab     Status: None   Collection Time: 06/19/24  7:04 PM   Specimen: Anterior Nasal Swab  Result  Value Ref Range Status   SARS Coronavirus 2 by RT PCR NEGATIVE NEGATIVE Final    Comment: (NOTE) SARS-CoV-2 target nucleic acids are NOT DETECTED.  The SARS-CoV-2 RNA is generally detectable in upper respiratory specimens during the acute phase of infection. The lowest concentration of SARS-CoV-2 viral copies this assay can detect is 138 copies/mL. A negative result does not preclude SARS-Cov-2 infection and should not be used as the sole basis for treatment or other patient management decisions. A negative result may occur with  improper specimen collection/handling, submission of specimen other than  nasopharyngeal swab, presence of viral mutation(s) within the areas targeted by this assay, and inadequate number of viral copies(<138 copies/mL). A negative result must be combined with clinical observations, patient history, and epidemiological information. The expected result is Negative.  Fact Sheet for Patients:  BloggerCourse.com  Fact Sheet for Healthcare Providers:  SeriousBroker.it  This test is no t yet approved or cleared by the [place]  FDA and  has been authorized for detection and/or diagnosis of SARS-CoV-2 by FDA under an Emergency Use Authorization (EUA). This EUA will remain  in effect (meaning this test can be used) for the duration of the COVID-19 declaration under Section 564(b)(1) of the Act, 21 U.S.C.section 360bbb-3(b)(1), unless the authorization is terminated  or revoked sooner.       Influenza A by PCR NEGATIVE NEGATIVE Final   Influenza B by PCR NEGATIVE NEGATIVE Final    Comment: (NOTE) The Xpert Xpress SARS-CoV-2/FLU/RSV plus assay is intended as an aid in the diagnosis of influenza from Nasopharyngeal swab specimens and should not be used as a sole basis for treatment. Nasal washings and aspirates are unacceptable for Xpert Xpress SARS-CoV-2/FLU/RSV testing.  Fact Sheet for  Patients: BloggerCourse.com  Fact Sheet for Healthcare Providers: SeriousBroker.it  This test is not yet approved or cleared by the [place]  FDA and has been authorized for detection and/or diagnosis of SARS-CoV-2 by FDA under an Emergency Use Authorization (EUA). This EUA will remain in effect (meaning this test can be used) for the duration of the COVID-19 declaration under Section 564(b)(1) of the Act, 21 U.S.C. section 360bbb-3(b)(1), unless the authorization is terminated or revoked.     Resp Syncytial Virus by PCR NEGATIVE NEGATIVE Final    Comment: (NOTE) Fact Sheet for Patients: BloggerCourse.com  Fact Sheet for Healthcare Providers: SeriousBroker.it  This test is not yet approved or cleared by the [place]  FDA and has been authorized for detection and/or diagnosis of SARS-CoV-2 by FDA under an Emergency Use Authorization (EUA). This EUA will remain in effect (meaning this test can be used) for the duration of the COVID-19 declaration under Section 564(b)(1) of the Act, 21 U.S.C. section 360bbb-3(b)(1), unless the authorization is terminated or revoked.  Performed at [facility], [address] [place], [place] [address]   Culture, blood (routine x 2)     Status: None (Preliminary result)   Collection Time: 06/21/24  4:49 PM   Specimen: BLOOD RIGHT FOREARM  Result Value Ref Range Status   Specimen Description BLOOD RIGHT FOREARM  Final   Special Requests   Final    BOTTLES DRAWN AEROBIC AND ANAEROBIC Blood Culture adequate volume   Culture  Setup Time   Final    AEROBIC BOTTLE ONLY GRAM NEGATIVE RODS Organism ID to follow Performed at [facility], [address], [place], [place] [address]    Culture GRAM NEGATIVE RODS  Final   Report Status PENDING  Incomplete  Culture, blood (routine x 2)     Status: None (Preliminary  result)   Collection Time: 06/21/24  4:54 PM   Specimen: BLOOD  Result Value Ref Range Status   Specimen Description BLOOD RIGHT ANTECUBITAL  Final   Special Requests   Final    BOTTLES DRAWN AEROBIC AND ANAEROBIC Blood Culture results may not be optimal due to an inadequate volume of blood received in culture bottles   Culture   Final    NO GROWTH < 24 HOURS Performed at [facility], [address] [place], [place] [address]    Report Status PENDING  Incomplete      Radiology Studies: US  Abdomen Limited RUQ (LIVER/GB) Result Date: 06/21/2024 CLINICAL DATA:  Upper abdominal pain EXAM: ULTRASOUND ABDOMEN LIMITED RIGHT UPPER QUADRANT COMPARISON:  CT 06/21/2024 FINDINGS: Gallbladder: No shadowing stones. Gallbladder distension. Focal wall thickening measuring up to 8 mm at the fundus. Negative sonographic Murphy. Common bile duct: Diameter: 7.1 mm Liver: Echogenic nodule left hepatic lobe measuring 14 x 14 x 15 mm likely corresponding to hypodensity on CT. Portal vein is patent on color Doppler imaging with normal direction of blood flow towards the liver. Other: None. IMPRESSION: 1. Gallbladder distension with focal echogenic wall thickening at the fundus. No shadowing stones. Negative sonographic Murphy. Findings are nonspecific and could be due to focal gallbladder wall inflammatory process. Gallbladder wall thickening secondary to neoplasm is possible but no masslike features on previously performed CT. 2. Common bile duct upper normal at 7.1 mm. Correlate with LFTs. 3. 15 mm echogenic nodule in the left hepatic lobe likely corresponding to hypodensity on CT, possible hemangioma. This could be confirmed with nonemergent MRI if desired given patient age. Electronically Signed   By: Luke Bun M.D.   On: 06/21/2024 21:13   CT ABDOMEN PELVIS W CONTRAST Result Date: 06/21/2024 CLINICAL DATA:  Abdomen pain EXAM: CT ABDOMEN AND PELVIS WITH CONTRAST TECHNIQUE: Multidetector CT imaging of the  abdomen and pelvis was performed using the standard protocol following bolus administration of intravenous contrast. RADIATION DOSE REDUCTION: This exam was performed according to the departmental dose-optimization program which includes automated exposure control, adjustment of the mA and/or kV according to patient size and/or use of iterative reconstruction technique. CONTRAST:  75mL OMNIPAQUE  IOHEXOL  350 MG/ML SOLN COMPARISON:  CT 03/14/2020 FINDINGS: Lower chest: Lung bases demonstrate mild subpleural scarring. No acute airspace disease. Pectus deformity of the lower anterior chest wall. Hepatobiliary: Distended gallbladder. No calcified stones. Possible trace pericholecystic fluid versus gallbladder wall thickening on coronal images. No significant biliary dilatation. Indeterminate hypodensity in the left hepatic lobe measuring about 13 mm on series 3, image 21. Pancreas: Atrophic.  No acute inflammation Spleen: Normal in size without focal abnormality. Adrenals/Urinary Tract: Adrenal glands are normal. Kidneys show slight prominence of collecting systems but no hydronephrosis or obstructing stone. Bladder is unremarkable Stomach/Bowel: The stomach is nonenlarged. No dilated small bowel. Diverticular disease of the left colon. No acute wall thickening Vascular/Lymphatic: Aortic atherosclerosis. No enlarged abdominal or pelvic lymph nodes. Reproductive: Status post hysterectomy. No adnexal masses. Other: Negative for pelvic effusion or free air. Musculoskeletal: No acute or suspicious osseous abnormality. Multilevel degenerative change IMPRESSION: 1. Distended gallbladder with possible trace pericholecystic fluid versus gallbladder wall thickening. Suggest correlation with right upper quadrant ultrasound. 2. Diverticular disease of the left colon without acute inflammatory process. 3. Indeterminate 13 mm hypodensity in the left hepatic lobe. When the patient is clinically stable and able to follow directions and  hold their breath (preferably as an outpatient) further evaluation with dedicated abdominal MRI should be considered if deemed clinically appropriate 4. Aortic atherosclerosis. Aortic Atherosclerosis (ICD10-I70.0). Electronically Signed   By: Luke Bun M.D.   On: 06/21/2024 17:23       LOS: 1 day   [name]  [facility] Pager on [email or web address]  06/22/2024, 9:06 AM

## 2024-06-22 NOTE — Evaluation (Signed)
 Occupational Therapy Evaluation Patient Details Name: [name] MRN: [id number] DOB: [birth year] Today's Date: 06/22/2024   History of Present Illness   Pt is an 89 y.o. female presenting 7/5 with RUQ abdominal pain radiating to back, nausea. Found to have suspected cholecystitis, hyponatremia, bacteriuria, hematuria. [facility] ED visit 7/3 Lab work demonstrates acute liver injury but pt leaving without being seen. PMH significant for COPD, PAD, HLD, OSA, breast cancer.     Clinical Impressions PTA, pt lived alone with PCA 5 days/week 5 hours/day to assist with IADL, supervise showers, grocery shop, drive to appointments. Upon evaluation, pt with general malaise affecting motivation for OOB ADL and mobility but agreeable to OT evaluation. Pt needing min A for STS transfers and LB ADL at time of eval, otherwise CGA-supervision and limited in activity tolerance. Pt to continue to benefit from acute OT services and recommending HHOT at discharge; pending progression, may not need follow up.      If plan is discharge home, recommend the following:   A little help with walking and/or transfers;A little help with bathing/dressing/bathroom;Assistance with cooking/housework;Assist for transportation;Help with stairs or ramp for entrance     Functional Status Assessment   Patient has had a recent decline in their functional status and demonstrates the ability to make significant improvements in function in a reasonable and predictable amount of time.     Equipment Recommendations   BSC/3in1     Recommendations for Other Services   PT consult     Precautions/Restrictions   Precautions Precautions: Fall Restrictions Weight Bearing Restrictions Per Provider Order: No     Mobility Bed Mobility Overal bed mobility: Needs Assistance Bed Mobility: Supine to Sit, Sit to Supine     Supine to sit: Supervision Sit to supine: Supervision        Transfers Overall transfer level:  Needs assistance Equipment used: Rolling walker (2 wheels) Transfers: Sit to/from Stand Sit to Stand: Min assist           General transfer comment: pt with tendency to pull up on RW      Balance Overall balance assessment: Needs assistance Sitting-balance support: No upper extremity supported, Feet supported Sitting balance-Leahy Scale: Good     Standing balance support: Bilateral upper extremity supported, Single extremity supported, During functional activity Standing balance-Leahy Scale: Poor Standing balance comment: poor to fair, benefits from RW                           ADL either performed or assessed with clinical judgement   ADL Overall ADL's : Needs assistance/impaired Eating/Feeding: Set up;Sitting   Grooming: Set up;Sitting   Upper Body Bathing: Set up;Sitting   Lower Body Bathing: Minimal assistance;Sit to/from stand   Upper Body Dressing : Set up;Sitting   Lower Body Dressing: Minimal assistance;Sit to/from stand   Toilet Transfer: Contact guard assist;Stand-pivot   Toileting- Clothing Manipulation and Hygiene: Minimal assistance Toileting - Clothing Manipulation Details (indicate cue type and reason): to bring depends underwear around hips     Functional mobility during ADLs: Contact guard assist;Rolling walker (2 wheels)       Vision Baseline Vision/History: 1 Wears glasses Ability to See in Adequate Light: 0 Adequate Patient Visual Report: No change from baseline Vision Assessment?: No apparent visual deficits     Perception         Praxis         Pertinent Vitals/Pain Pain Assessment Pain Assessment: Faces Faces Pain  Scale: Hurts a little bit Pain Location: abdomen Pain Descriptors / Indicators: Sore Pain Intervention(s): Limited activity within patient's tolerance, Monitored during session     Extremity/Trunk Assessment Upper Extremity Assessment Upper Extremity Assessment: Right hand dominant;Generalized  weakness   Lower Extremity Assessment Lower Extremity Assessment: Defer to PT evaluation       Communication Communication Communication: No apparent difficulties   Cognition Arousal: Alert Behavior During Therapy: WFL for tasks assessed/performed Cognition: No apparent impairments             OT - Cognition Comments: following 1-2 step commands, is oriented, making jokes.                 Following commands: Intact       Cueing  General Comments   Cueing Techniques: Verbal cues      Exercises     Shoulder Instructions      Home Living Family/patient expects to be discharged to:: Private residence Living Arrangements: Alone Available Help at Discharge: Family;Personal care attendant (5 days/week 5 hours/day 10-3) Type of Home: Other(Comment) (condo) Home Access: Level entry     Home Layout: One level     Bathroom Shower/Tub: Walk-in shower;Door   Bathroom Toilet: Handicapped height     Home Equipment: Agricultural consultant (2 wheels);Rollator (4 wheels);Cane - single point;Shower seat - built in;Grab bars - tub/shower;Hand held shower head;Grab bars - toilet;Transport chair          Prior Functioning/Environment Prior Level of Function : Independent/Modified Independent;Needs assist             Mobility Comments: use of RW in the home ADLs Comments: PCA prepared breakfaast/lunch, laundry, set-up for medication management, grocery shopping, supervises showers, drives to appts. Pt otherwise mod I in ADL    OT Problem List: Decreased strength;Decreased activity tolerance;Impaired balance (sitting and/or standing);Pain   OT Treatment/Interventions: Self-care/ADL training;Therapeutic exercise;DME and/or AE instruction;Therapeutic activities;Patient/family education;Balance training      OT Goals(Current goals can be found in the care plan section)   Acute Rehab OT Goals Patient Stated Goal: live until september OT Goal Formulation: With  patient Time For Goal Achievement: 07/06/24 Potential to Achieve Goals: Good   OT Frequency:  Min 2X/week    Co-evaluation              AM-PAC OT 6 Clicks Daily Activity     Outcome Measure Help from another person eating meals?: None Help from another person taking care of personal grooming?: A Little Help from another person toileting, which includes using toliet, bedpan, or urinal?: A Little Help from another person bathing (including washing, rinsing, drying)?: A Little Help from another person to put on and taking off regular upper body clothing?: A Little Help from another person to put on and taking off regular lower body clothing?: A Little 6 Click Score: 19   End of Session Equipment Utilized During Treatment: Rolling walker (2 wheels) Nurse Communication: Mobility status  Activity Tolerance: Patient tolerated treatment well Patient left: in bed;with call bell/phone within reach;with family/visitor present  OT Visit Diagnosis: Unsteadiness on feet (R26.81);Muscle weakness (generalized) (M62.81)                Time: 0926-1000 OT Time Calculation (min): 34 min Charges:  OT General Charges $OT Visit: 1 Visit OT Evaluation $OT Eval Low Complexity: 1 Low OT Treatments $Self Care/Home Management : 8-22 mins  [name], OTR/L [facility] Acute Rehabilitation Office: [phone]   [name] 06/22/2024, 10:17  AM

## 2024-06-22 NOTE — Progress Notes (Signed)
 PHARMACY - PHYSICIAN COMMUNICATION CRITICAL VALUE ALERT - BLOOD CULTURE IDENTIFICATION (BCID)  [name] is an 89 y.o. female who presented to [facility] on 06/21/2024 with a chief complaint of RUQ abdominal pain found with acute liver injury, ?cholecystitis  Assessment:  E. Coli in 1 of 4 bottles, with no resistance mechanisms detected  Name of physician (or Provider) Contacted: Dr. Verdene  Current antibiotics: Ceftriaxone  2g IV q24h, metronidazole  500 mg q12h  Changes to prescribed antibiotics recommended:  Patient is on recommended antibiotics - No changes needed  Results for orders placed or performed during the hospital encounter of 06/21/24  Blood Culture ID Panel (Reflexed) (Collected: 06/21/2024  4:49 PM)  Result Value Ref Range   Enterococcus faecalis NOT DETECTED NOT DETECTED   Enterococcus Faecium NOT DETECTED NOT DETECTED   Listeria monocytogenes NOT DETECTED NOT DETECTED   Staphylococcus species NOT DETECTED NOT DETECTED   Staphylococcus aureus (BCID) NOT DETECTED NOT DETECTED   Staphylococcus epidermidis NOT DETECTED NOT DETECTED   Staphylococcus lugdunensis NOT DETECTED NOT DETECTED   Streptococcus species NOT DETECTED NOT DETECTED   Streptococcus agalactiae NOT DETECTED NOT DETECTED   Streptococcus pneumoniae NOT DETECTED NOT DETECTED   Streptococcus pyogenes NOT DETECTED NOT DETECTED   A.calcoaceticus-baumannii NOT DETECTED NOT DETECTED   Bacteroides fragilis NOT DETECTED NOT DETECTED   Enterobacterales DETECTED (A) NOT DETECTED   Enterobacter cloacae complex NOT DETECTED NOT DETECTED   Escherichia coli DETECTED (A) NOT DETECTED   Klebsiella aerogenes NOT DETECTED NOT DETECTED   Klebsiella oxytoca NOT DETECTED NOT DETECTED   Klebsiella pneumoniae NOT DETECTED NOT DETECTED   Proteus species NOT DETECTED NOT DETECTED   Salmonella species NOT DETECTED NOT DETECTED   Serratia marcescens NOT DETECTED NOT DETECTED   Haemophilus influenzae NOT DETECTED NOT  DETECTED   Neisseria meningitidis NOT DETECTED NOT DETECTED   Pseudomonas aeruginosa NOT DETECTED NOT DETECTED   Stenotrophomonas maltophilia NOT DETECTED NOT DETECTED   Candida albicans NOT DETECTED NOT DETECTED   Candida auris NOT DETECTED NOT DETECTED   Candida glabrata NOT DETECTED NOT DETECTED   Candida krusei NOT DETECTED NOT DETECTED   Candida parapsilosis NOT DETECTED NOT DETECTED   Candida tropicalis NOT DETECTED NOT DETECTED   Cryptococcus neoformans/gattii NOT DETECTED NOT DETECTED   CTX-M ESBL NOT DETECTED NOT DETECTED   Carbapenem resistance IMP NOT DETECTED NOT DETECTED   Carbapenem resistance KPC NOT DETECTED NOT DETECTED   Carbapenem resistance NDM NOT DETECTED NOT DETECTED   Carbapenem resist OXA 48 LIKE NOT DETECTED NOT DETECTED   Carbapenem resistance VIM NOT DETECTED NOT DETECTED   Randall Call, PharmD, BCPS 06/22/24 10:00 AM

## 2024-06-22 NOTE — Plan of Care (Signed)

## 2024-06-23 ENCOUNTER — Inpatient Hospital Stay (HOSPITAL_COMMUNITY)

## 2024-06-23 DIAGNOSIS — R7989 Other specified abnormal findings of blood chemistry: Secondary | ICD-10-CM | POA: Diagnosis not present

## 2024-06-23 DIAGNOSIS — R1011 Right upper quadrant pain: Secondary | ICD-10-CM | POA: Diagnosis not present

## 2024-06-23 LAB — COMPREHENSIVE METABOLIC PANEL WITH GFR
ALT: 163 U/L — ABNORMAL HIGH (ref 0–44)
AST: 40 U/L (ref 15–41)
Albumin: 2.9 g/dL — ABNORMAL LOW (ref 3.5–5.0)
Alkaline Phosphatase: 90 U/L (ref 38–126)
Anion gap: 9 (ref 5–15)
BUN: 7 mg/dL — ABNORMAL LOW (ref 8–23)
CO2: 24 mmol/L (ref 22–32)
Calcium: 8.3 mg/dL — ABNORMAL LOW (ref 8.9–10.3)
Chloride: 104 mmol/L (ref 98–111)
Creatinine, Ser: 0.67 mg/dL (ref 0.44–1.00)
GFR, Estimated: >60 mL/min (ref >60)
Glucose, Bld: 109 mg/dL — ABNORMAL HIGH (ref 70–99)
Potassium: 3.6 mmol/L (ref 3.5–5.1)
Sodium: 137 mmol/L (ref 135–145)
Total Bilirubin: 0.8 mg/dL (ref 0.0–1.2)
Total Protein: 5.8 g/dL — ABNORMAL LOW (ref 6.5–8.1)

## 2024-06-23 LAB — CBC
HCT: 34.7 % — ABNORMAL LOW (ref 36.0–46.0)
Hemoglobin: 11.6 g/dL — ABNORMAL LOW (ref 12.0–15.0)
MCH: 31.6 pg (ref 26.0–34.0)
MCHC: 33.4 g/dL (ref 30.0–36.0)
MCV: 94.6 fL (ref 80.0–100.0)
Platelets: 115 K/uL — ABNORMAL LOW (ref 150–400)
RBC: 3.67 MIL/uL — ABNORMAL LOW (ref 3.87–5.11)
RDW: 13.2 % (ref 11.5–15.5)
WBC: 10.8 K/uL — ABNORMAL HIGH (ref 4.0–10.5)
nRBC: 0 % (ref 0.0–0.2)

## 2024-06-23 LAB — HEPATITIS PANEL, ACUTE
HCV Ab: NONREACTIVE
Hep A IgM: NONREACTIVE
Hep B C IgM: NONREACTIVE
Hepatitis B Surface Ag: NONREACTIVE

## 2024-06-23 MED ORDER — SODIUM CHLORIDE 0.45 % IV SOLN: INTRAVENOUS | Status: AC

## 2024-06-23 NOTE — Progress Notes (Signed)
 Progress Note     Subjective: Pt feeling nauseated and weak after getting up with PT. She has not taken any PO, does not feel hungry. Denies RUQ pain at rest but does report pain with palpation. Her 3 daughters are at bedside and we discussed current imaging findings and labs. Discussed possible treatment options for acalculous cholecystitis of antibiotics alone, drain, and surgery.   Objective: Vital signs in last 24 hours: Temp:  [98.4 F (36.9 C)-99.7 F (37.6 C)] 98.9 F (37.2 C) (07/07 0756) Pulse Rate:  [68-72] 72 (07/07 0756) Resp:  [16-18] 18 (07/07 0756) BP: (128-173)/(56-74) 158/74 (07/07 0756) SpO2:  [91 %-92 %] 92 % (07/07 0812) Last BM Date : 06/19/24  Intake/Output from previous day: 07/06 0701 - 07/07 0700 In: 1112 [I.V.:862; IV Piggyback:250] Out: 1350 [Urine:1350] Intake/Output this shift: Total I/O In: 118 [P.O.:118] Out: -   PE: General: pleasant, WD, elderly female who is laying in bed in NAD HEENT: sclera anicteric  Heart: regular, rate, and rhythm.   Lungs:  Respiratory effort nonlabored Abd: soft, TTP in RUQ and RLQ with positive murphy sign, ND Skin: not jaundiced   Lab Results:  Recent Labs    06/22/24 0510 06/23/24 0249  WBC 10.0 10.8*  HGB 11.0* 11.6*  HCT 32.7* 34.7*  PLT 92* 115*   BMET Recent Labs    06/22/24 0510 06/23/24 0249  NA 135 137  K 3.3* 3.6  CL 104 104  CO2 23 24  GLUCOSE 100* 109*  BUN 14 7*  CREATININE 0.76 0.67  CALCIUM  8.0* 8.3*   PT/INR No results for input(s): LABPROT, INR in the last 72 hours. CMP     Component Value Date/Time   NA 137 06/23/2024 0249   K 3.6 06/23/2024 0249   CL 104 06/23/2024 0249   CO2 24 06/23/2024 0249   GLUCOSE 109 (H) 06/23/2024 0249   BUN 7 (L) 06/23/2024 0249   CREATININE 0.67 06/23/2024 0249   CREATININE 0.74 05/05/2024 1343   CALCIUM  8.3 (L) 06/23/2024 0249   PROT 5.8 (L) 06/23/2024 0249   ALBUMIN 2.9 (L) 06/23/2024 0249   AST 40 06/23/2024 0249   AST 15  05/05/2024 1343   ALT 163 (H) 06/23/2024 0249   ALT 11 05/05/2024 1343   ALKPHOS 90 06/23/2024 0249   BILITOT 0.8 06/23/2024 0249   BILITOT 0.5 05/05/2024 1343   GFRNONAA >60 06/23/2024 0249   GFRNONAA >60 05/05/2024 1343   GFRAA >60 09/08/2020 1233   Lipase     Component Value Date/Time   LIPASE 27 06/21/2024 1532       Studies/Results: MR 3D Recon At Scanner Result Date: 06/22/2024 CLINICAL DATA:  Elevated liver function tests. EXAM: MRI ABDOMEN WITHOUT AND WITH CONTRAST (INCLUDING MRCP) TECHNIQUE: Multiplanar multisequence MR imaging of the abdomen was performed both before and after the administration of intravenous contrast. Heavily T2-weighted images of the biliary and pancreatic ducts were obtained, and three-dimensional MRCP images were rendered by post processing. CONTRAST:  7mL GADAVIST  GADOBUTROL  1 MMOL/ML IV SOLN COMPARISON:  CT 06/21/2024 FINDINGS: Exam is degraded patient respiratory motion. Patient has difficulty falling breath hold commands. Lower chest:  Lung bases are clear. Hepatobiliary: No intrahepatic biliary duct dilatation. No hepatic steatosis. Normal liver parenchyma. Within the lateral segment LEFT hepatic lobe 13 mm lesion is hyperintense on T2 weighted imaging corresponds to lesion of concern on comparison CT. There is motion degradation of the postcontrast T1 weighted imaging however lesion appears to have peripheral enhancement which is  typical of benign hemangiomas. MRCP sequences are degraded by patient respiratory motion. No gallstones are evident. No biliary duct dilatation evident. No choledocholithiasis. Common bile duct measures 5 mm (image 21/series 3). Pancreas: Normal pancreatic parenchymal intensity. No ductal dilatation or inflammation. Spleen: Normal spleen. Adrenals/urinary tract: Adrenal glands and kidneys are normal. Stomach/Bowel: Stomach and limited of the small bowel is unremarkable Vascular/Lymphatic: Abdominal aortic normal caliber. No  retroperitoneal periportal lymphadenopathy. Musculoskeletal: No aggressive osseous lesion IMPRESSION: 1. Certain sequences are severely degraded by patient respiratory motion. 2. No evidence acute cholecystitis. 3. No choledocholithiasis. 4. Normal common hepatic duct and common bile duct. No evidence of biliary obstruction. 5. No hepatic steatosis. 6. Probable benign hemangioma in the RIGHT hepatic lobe. Electronically Signed   By: Jackquline Boxer M.D.   On: 06/22/2024 16:06   MR ABDOMEN MRCP W WO CONTAST Result Date: 06/22/2024 CLINICAL DATA:  Elevated liver function tests. EXAM: MRI ABDOMEN WITHOUT AND WITH CONTRAST (INCLUDING MRCP) TECHNIQUE: Multiplanar multisequence MR imaging of the abdomen was performed both before and after the administration of intravenous contrast. Heavily T2-weighted images of the biliary and pancreatic ducts were obtained, and three-dimensional MRCP images were rendered by post processing. CONTRAST:  7mL GADAVIST  GADOBUTROL  1 MMOL/ML IV SOLN COMPARISON:  CT 06/21/2024 FINDINGS: Exam is degraded patient respiratory motion. Patient has difficulty falling breath hold commands. Lower chest:  Lung bases are clear. Hepatobiliary: No intrahepatic biliary duct dilatation. No hepatic steatosis. Normal liver parenchyma. Within the lateral segment LEFT hepatic lobe 13 mm lesion is hyperintense on T2 weighted imaging corresponds to lesion of concern on comparison CT. There is motion degradation of the postcontrast T1 weighted imaging however lesion appears to have peripheral enhancement which is typical of benign hemangiomas. MRCP sequences are degraded by patient respiratory motion. No gallstones are evident. No biliary duct dilatation evident. No choledocholithiasis. Common bile duct measures 5 mm (image 21/series 3). Pancreas: Normal pancreatic parenchymal intensity. No ductal dilatation or inflammation. Spleen: Normal spleen. Adrenals/urinary tract: Adrenal glands and kidneys are normal.  Stomach/Bowel: Stomach and limited of the small bowel is unremarkable Vascular/Lymphatic: Abdominal aortic normal caliber. No retroperitoneal periportal lymphadenopathy. Musculoskeletal: No aggressive osseous lesion IMPRESSION: 1. Certain sequences are severely degraded by patient respiratory motion. 2. No evidence acute cholecystitis. 3. No choledocholithiasis. 4. Normal common hepatic duct and common bile duct. No evidence of biliary obstruction. 5. No hepatic steatosis. 6. Probable benign hemangioma in the RIGHT hepatic lobe. Electronically Signed   By: Jackquline Boxer M.D.   On: 06/22/2024 16:06   US  Abdomen Limited RUQ (LIVER/GB) Result Date: 06/21/2024 CLINICAL DATA:  Upper abdominal pain EXAM: ULTRASOUND ABDOMEN LIMITED RIGHT UPPER QUADRANT COMPARISON:  CT 06/21/2024 FINDINGS: Gallbladder: No shadowing stones. Gallbladder distension. Focal wall thickening measuring up to 8 mm at the fundus. Negative sonographic Murphy. Common bile duct: Diameter: 7.1 mm Liver: Echogenic nodule left hepatic lobe measuring 14 x 14 x 15 mm likely corresponding to hypodensity on CT. Portal vein is patent on color Doppler imaging with normal direction of blood flow towards the liver. Other: None. IMPRESSION: 1. Gallbladder distension with focal echogenic wall thickening at the fundus. No shadowing stones. Negative sonographic Murphy. Findings are nonspecific and could be due to focal gallbladder wall inflammatory process. Gallbladder wall thickening secondary to neoplasm is possible but no masslike features on previously performed CT. 2. Common bile duct upper normal at 7.1 mm. Correlate with LFTs. 3. 15 mm echogenic nodule in the left hepatic lobe likely corresponding to hypodensity on CT, possible hemangioma.  This could be confirmed with nonemergent MRI if desired given patient age. Electronically Signed   By: Luke Bun M.D.   On: 06/21/2024 21:13   CT ABDOMEN PELVIS W CONTRAST Result Date: 06/21/2024 CLINICAL DATA:   Abdomen pain EXAM: CT ABDOMEN AND PELVIS WITH CONTRAST TECHNIQUE: Multidetector CT imaging of the abdomen and pelvis was performed using the standard protocol following bolus administration of intravenous contrast. RADIATION DOSE REDUCTION: This exam was performed according to the departmental dose-optimization program which includes automated exposure control, adjustment of the mA and/or kV according to patient size and/or use of iterative reconstruction technique. CONTRAST:  75mL OMNIPAQUE  IOHEXOL  350 MG/ML SOLN COMPARISON:  CT 03/14/2020 FINDINGS: Lower chest: Lung bases demonstrate mild subpleural scarring. No acute airspace disease. Pectus deformity of the lower anterior chest wall. Hepatobiliary: Distended gallbladder. No calcified stones. Possible trace pericholecystic fluid versus gallbladder wall thickening on coronal images. No significant biliary dilatation. Indeterminate hypodensity in the left hepatic lobe measuring about 13 mm on series 3, image 21. Pancreas: Atrophic.  No acute inflammation Spleen: Normal in size without focal abnormality. Adrenals/Urinary Tract: Adrenal glands are normal. Kidneys show slight prominence of collecting systems but no hydronephrosis or obstructing stone. Bladder is unremarkable Stomach/Bowel: The stomach is nonenlarged. No dilated small bowel. Diverticular disease of the left colon. No acute wall thickening Vascular/Lymphatic: Aortic atherosclerosis. No enlarged abdominal or pelvic lymph nodes. Reproductive: Status post hysterectomy. No adnexal masses. Other: Negative for pelvic effusion or free air. Musculoskeletal: No acute or suspicious osseous abnormality. Multilevel degenerative change IMPRESSION: 1. Distended gallbladder with possible trace pericholecystic fluid versus gallbladder wall thickening. Suggest correlation with right upper quadrant ultrasound. 2. Diverticular disease of the left colon without acute inflammatory process. 3. Indeterminate 13 mm hypodensity  in the left hepatic lobe. When the patient is clinically stable and able to follow directions and hold their breath (preferably as an outpatient) further evaluation with dedicated abdominal MRI should be considered if deemed clinically appropriate 4. Aortic atherosclerosis. Aortic Atherosclerosis (ICD10-I70.0). Electronically Signed   By: Luke Bun M.D.   On: 06/21/2024 17:23    Anti-infectives: Anti-infectives (From admission, onward)    Start     Dose/Rate Route Frequency Ordered Stop   06/22/24 1700  cefTRIAXone  (ROCEPHIN ) 2 g in sodium chloride  0.9 % 100 mL IVPB        2 g 200 mL/hr over 30 Minutes Intravenous Every 24 hours 06/21/24 1932     06/22/24 0500  metroNIDAZOLE  (FLAGYL ) IVPB 500 mg        500 mg 100 mL/hr over 60 Minutes Intravenous Every 12 hours 06/21/24 1932     06/21/24 1745  cefTRIAXone  (ROCEPHIN ) 2 g in sodium chloride  0.9 % 100 mL IVPB       Placed in And Linked Group   2 g 200 mL/hr over 30 Minutes Intravenous  Once 06/21/24 1730 06/21/24 1814   06/21/24 1745  metroNIDAZOLE  (FLAGYL ) IVPB 500 mg       Placed in And Linked Group   500 mg 100 mL/hr over 60 Minutes Intravenous  Once 06/21/24 1730 06/21/24 1857        Assessment/Plan Elevated LFTs and RUQ pain Possible acalculous cholecystitis   - imaging without cholelithiasis or choledocholithiasis, equivocal for cholecystitis  - LFTs trending down from admit - clinically patient does have RUQ TTP with positive Murphy sign - WBC 10 from 14, however pt with E.coli and Enterobacterales in blood Cxs and UA that is nitrate positive with many bacteria - would  recommend Urine Cx  - will get HIDA to better assess - if patient felt to have acalculous cholecystitis will need to determine whether abx alone vs percutaneous cholecystostomy vs cholecystectomy would be the best course of management. In setting of acalculous cholecystitis percutaneous cholecystostomy is often definitive management.   FEN: CLD but has  not had any PO intake, IVF per [facility] VTE: ok to have LMWH or SQH from surgery standpoint ID: rocephin /flagyl   - Per [facility] -  ?UTI - asymptomatic, would still recommend UCx Bacteremia - blood Cx with E. Coli and Enterobacterales, on ABX PAD Hx of COPD Hx breast cancer s/p lumpectomy  Normocytic anemia/thrombocytopenia     LOS: 2 days   I reviewed hospitalist notes, last 24 h vitals and pain scores, last 48 h intake and output, last 24 h labs and trends, and last 24 h imaging results.  This care required moderate level of medical decision making.    Burnard JONELLE Louder, [facility] 06/23/2024, 12:27 PM Please see Amion for pager number during day hours 7:00am-4:30pm

## 2024-06-23 NOTE — Plan of Care (Signed)
   Problem: Education: Goal: Knowledge of General Education information will improve Description Including pain rating scale, medication(s)/side effects and non-pharmacologic comfort measures Outcome: Progressing   Problem: Health Behavior/Discharge Planning: Goal: Ability to manage health-related needs will improve Outcome: Progressing

## 2024-06-23 NOTE — TOC CM/SW Note (Signed)
 Transition of Care [facility]) - Inpatient Brief Assessment   Patient Details  Name: MINDEE ROBLEDO MRN: [id number] Date of Birth: 02-18-1932  Transition of Care [facility]) CM/SW Contact:    Lauraine FORBES Saa, LCSW Phone Number: 06/23/2024, 5:05 PM   Clinical Narrative:  5:05 PM Per chart review, patient resides at home alone. Patient has a PCP and insurance. Patient does not have SNF history. Patient has HH history with [facility]. Patient has DME (RW, BSC and shower chair) history. Patient's preferred pharmacy is [facility] [address]. CSW provided SDOH (social connections) resources. Physical therapy recommended patient discharge home with [facility]. TOC will continue to follow and be available to assist.  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

## 2024-06-23 NOTE — Progress Notes (Addendum)
 [facility] PROGRESS NOTE   [name] FMW:[id number] DOB: 08-29-1932 DOA: 06/21/2024  PCP: [name], MD  Brief History: 89 y.o. female with hx of COPD, PAD, hyperlipidemia, OSA, right breast invasive ductal carcinoma stage Ia s/p lumpectomy, radiation, hormone therapy, who presented with right upper quadrant abdominal pain radiating to the back and nausea and vomiting.  Patient was found to have abnormal LFTs.  She was hospitalized for further management.    Consultants: General Surgery has been consulted  Procedures: None yet   Subjective/Interval History: Patient has had some hallucinations ever since she received Ativan  yesterday for the MRI.  No focal deficits noted on examination today.  Patient continues to have abdominal pain.  Her daughter is at the bedside.    Assessment/Plan:  Abdominal pain/abnormal LFTs/concern for cholecystitis She was noted to have significantly elevated AST ALT bilirubin.  Bilirubin has been normal for the last 48 hours.  AST and ALT have improved as well. Imaging studies raises concern for cholecystitis.  Right upper quadrant ultrasound was nonspecific.  No gallstones were noted.  CBD was noted to be upper limit of normal.  Hepatic lobe lesion was noted thought to be hemangioma.   Patient was seen by general surgery.  They ordered MRI of the abdomen which was a poor study due to motion.  However no cholecystitis was evident. Patient is still tender in the right upper quadrant.  Blood cultures positive for E. coli.  Clinically she appears to have gallbladder disease but would defer further management to general surgery. Hepatitis panel is unremarkable. Continue with ceftriaxone  and metronidazole .  Acute encephalopathy with hallucinations Most likely due to hospital stay and Ativan .  No focal neurological deficits on examination.  Continue to monitor.  Reorient frequently.  E. coli bacteremia Most likely source is gallbladder.  UA was  noted to be abnormal however patient denies any urinary symptoms.  Await sensitivities.  Continue ceftriaxone .  Hyponatremia Resolved  Hypokalemia Supplemented.  Abnormal UA No symptoms of dysuria.  Is on ceftriaxone .  Continue to monitor.    History of COPD Respiratory status is stable.  Peripheral artery disease Holding aspirin .  Holding statin.  History of right breast carcinoma She is status post lumpectomy radiation and hormone treatment and is in remission.  Normocytic anemia/thrombocytopenia Could be related to acute illness.  Check anemia panel.   Platelet counts are improving.   DVT Prophylaxis: SCDs Code Status: Full code Family Communication: Discussed with patient and her daughter Disposition Plan: To be determined    Medications: Scheduled:  buPROPion   150 mg Oral Daily   escitalopram   40 mg Oral Daily   pantoprazole   40 mg Oral Daily   sodium chloride  flush  3 mL Intravenous Q12H   umeclidinium bromide   1 puff Inhalation Daily   Continuous:  sodium chloride      cefTRIAXone  (ROCEPHIN )  IV Stopped (06/22/24 1926)   metronidazole  500 mg (06/23/24 0501)   PRN:acetaminophen , albuterol , melatonin, ondansetron  (ZOFRAN ) IV, oxyCODONE  **OR** oxyCODONE , polyethylene glycol  Antibiotics: Anti-infectives (From admission, onward)    Start     Dose/Rate Route Frequency Ordered Stop   06/22/24 1700  cefTRIAXone  (ROCEPHIN ) 2 g in sodium chloride  0.9 % 100 mL IVPB        2 g 200 mL/hr over 30 Minutes Intravenous Every 24 hours 06/21/24 1932     06/22/24 0500  metroNIDAZOLE  (FLAGYL ) IVPB 500 mg        500 mg 100 mL/hr over 60 Minutes Intravenous Every 12  hours 06/21/24 1932     06/21/24 1745  cefTRIAXone  (ROCEPHIN ) 2 g in sodium chloride  0.9 % 100 mL IVPB       Placed in And Linked Group   2 g 200 mL/hr over 30 Minutes Intravenous  Once 06/21/24 1730 06/21/24 1814   06/21/24 1745  metroNIDAZOLE  (FLAGYL ) IVPB 500 mg       Placed in And Linked Group   500  mg 100 mL/hr over 60 Minutes Intravenous  Once 06/21/24 1730 06/21/24 1857       Objective:  Vital Signs  Vitals:   06/22/24 1923 06/23/24 0414 06/23/24 0756 06/23/24 0812  BP: (!) 128/57 (!) 173/56 (!) 158/74   Pulse: 68 72 72   Resp: 16 16 18    Temp: 99.7 F (37.6 C) 98.4 F (36.9 C) 98.9 F (37.2 C)   TempSrc: Oral Oral Oral   SpO2: 91% 92% 91% 92%  Weight:      Height:        Intake/Output Summary (Last 24 hours) at 06/23/2024 1040 Last data filed at 06/23/2024 0800 Gross per 24 hour  Intake 1192.31 ml  Output 900 ml  Net 292.31 ml   Filed Weights   06/21/24 1525 06/21/24 2052  Weight: 67.1 kg 67.9 kg    General appearance: Awake alert.  In no distress Resp: Clear to auscultation bilaterally.  Normal effort Cardio: S1-S2 is normal regular.  No S3-S4.  No rubs murmurs or bruit GI: Abdomen is soft.  Remains tender in the right upper quadrant No focal neurological deficits noted.  Lab Results:  Data Reviewed: I have personally reviewed following labs and reports of the imaging studies  CBC: Recent Labs  Lab 06/19/24 1918 06/21/24 1532 06/22/24 0510 06/23/24 0249  WBC 4.6 14.0* 10.0 10.8*  NEUTROABS  --  11.8*  --   --   HGB 13.8 13.0 11.0* 11.6*  HCT 41.0 37.3 32.7* 34.7*  MCV 93.8 93.0 91.6 94.6  PLT 122* 102* 92* 115*    Basic Metabolic Panel: Recent Labs  Lab 06/19/24 1918 06/21/24 1532 06/22/24 0510 06/23/24 0249  NA 138 133* 135 137  K 3.5 3.7 3.3* 3.6  CL 102 97* 104 104  CO2 21* 25 23 24   GLUCOSE 126* 99 100* 109*  BUN 15 26* 14 7*  CREATININE 0.87 0.93 0.76 0.67  CALCIUM  8.7* 8.9 8.0* 8.3*  MG  --   --  1.8  --   PHOS  --   --  2.6  --     GFR: Estimated Creatinine Clearance: 42 mL/min (by C-G formula based on SCr of 0.67 mg/dL).  Liver Function Tests: Recent Labs  Lab 06/19/24 1918 06/21/24 1532 06/22/24 0510 06/23/24 0249  AST 1,658* 155* 73* 40  ALT 958* 334* 223* 163*  ALKPHOS 117 103 89 90  BILITOT 3.2* 1.3* 0.9  0.8  PROT 6.4* 6.3* 5.3* 5.8*  ALBUMIN 3.8 3.2* 2.7* 2.9*    Recent Labs  Lab 06/19/24 1918 06/21/24 1532  LIPASE 31 27     Recent Results (from the past 240 hours)  Resp panel by RT-PCR (RSV, Flu A&B, Covid) Anterior Nasal Swab     Status: None   Collection Time: 06/19/24  7:04 PM   Specimen: Anterior Nasal Swab  Result Value Ref Range Status   SARS Coronavirus 2 by RT PCR NEGATIVE NEGATIVE Final    Comment: (NOTE) SARS-CoV-2 target nucleic acids are NOT DETECTED.  The SARS-CoV-2 RNA is generally detectable in upper respiratory  specimens during the acute phase of infection. The lowest concentration of SARS-CoV-2 viral copies this assay can detect is 138 copies/mL. A negative result does not preclude SARS-Cov-2 infection and should not be used as the sole basis for treatment or other patient management decisions. A negative result may occur with  improper specimen collection/handling, submission of specimen other than nasopharyngeal swab, presence of viral mutation(s) within the areas targeted by this assay, and inadequate number of viral copies(<138 copies/mL). A negative result must be combined with clinical observations, patient history, and epidemiological information. The expected result is Negative.  Fact Sheet for Patients:  BloggerCourse.com  Fact Sheet for Healthcare Providers:  SeriousBroker.it  This test is no t yet approved or cleared by the [place]  FDA and  has been authorized for detection and/or diagnosis of SARS-CoV-2 by FDA under an Emergency Use Authorization (EUA). This EUA will remain  in effect (meaning this test can be used) for the duration of the COVID-19 declaration under Section 564(b)(1) of the Act, 21 U.S.C.section 360bbb-3(b)(1), unless the authorization is terminated  or revoked sooner.       Influenza A by PCR NEGATIVE NEGATIVE Final   Influenza B by PCR NEGATIVE NEGATIVE Final     Comment: (NOTE) The Xpert Xpress SARS-CoV-2/FLU/RSV plus assay is intended as an aid in the diagnosis of influenza from Nasopharyngeal swab specimens and should not be used as a sole basis for treatment. Nasal washings and aspirates are unacceptable for Xpert Xpress SARS-CoV-2/FLU/RSV testing.  Fact Sheet for Patients: BloggerCourse.com  Fact Sheet for Healthcare Providers: SeriousBroker.it  This test is not yet approved or cleared by the [place]  FDA and has been authorized for detection and/or diagnosis of SARS-CoV-2 by FDA under an Emergency Use Authorization (EUA). This EUA will remain in effect (meaning this test can be used) for the duration of the COVID-19 declaration under Section 564(b)(1) of the Act, 21 U.S.C. section 360bbb-3(b)(1), unless the authorization is terminated or revoked.     Resp Syncytial Virus by PCR NEGATIVE NEGATIVE Final    Comment: (NOTE) Fact Sheet for Patients: BloggerCourse.com  Fact Sheet for Healthcare Providers: SeriousBroker.it  This test is not yet approved or cleared by the [place]  FDA and has been authorized for detection and/or diagnosis of SARS-CoV-2 by FDA under an Emergency Use Authorization (EUA). This EUA will remain in effect (meaning this test can be used) for the duration of the COVID-19 declaration under Section 564(b)(1) of the Act, 21 U.S.C. section 360bbb-3(b)(1), unless the authorization is terminated or revoked.  Performed at [facility], [address], [place], [place] [address]   Culture, blood (routine x 2)     Status: None (Preliminary result)   Collection Time: 06/21/24  4:49 PM   Specimen: BLOOD RIGHT FOREARM  Result Value Ref Range Status   Specimen Description BLOOD RIGHT FOREARM  Final   Special Requests   Final    BOTTLES DRAWN AEROBIC AND ANAEROBIC Blood Culture adequate volume    Culture  Setup Time   Final    AEROBIC BOTTLE ONLY GRAM NEGATIVE RODS CRITICAL RESULT CALLED TO, READ BACK BY AND VERIFIED WITH: MAYA RANDALL CALL [phone] AT 0955 BY EC Performed at [facility], [address], [place], [place] [address]    Culture GRAM NEGATIVE RODS  Final   Report Status PENDING  Incomplete  Blood Culture ID Panel (Reflexed)     Status: Abnormal   Collection Time: 06/21/24  4:49 PM  Result Value  Ref Range Status   Enterococcus faecalis NOT DETECTED NOT DETECTED Final   Enterococcus Faecium NOT DETECTED NOT DETECTED Final   Listeria monocytogenes NOT DETECTED NOT DETECTED Final   Staphylococcus species NOT DETECTED NOT DETECTED Final   Staphylococcus aureus (BCID) NOT DETECTED NOT DETECTED Final   Staphylococcus epidermidis NOT DETECTED NOT DETECTED Final   Staphylococcus lugdunensis NOT DETECTED NOT DETECTED Final   Streptococcus species NOT DETECTED NOT DETECTED Final   Streptococcus agalactiae NOT DETECTED NOT DETECTED Final   Streptococcus pneumoniae NOT DETECTED NOT DETECTED Final   Streptococcus pyogenes NOT DETECTED NOT DETECTED Final   A.calcoaceticus-baumannii NOT DETECTED NOT DETECTED Final   Bacteroides fragilis NOT DETECTED NOT DETECTED Final   Enterobacterales DETECTED (A) NOT DETECTED Final    Comment: Enterobacterales represent a large order of gram negative bacteria, not a single organism. CRITICAL RESULT CALLED TO, READ BACK BY AND VERIFIED WITH: PHARMD JENNY ZHOU [phone] AT 0955 BY EC    Enterobacter cloacae complex NOT DETECTED NOT DETECTED Final   Escherichia coli DETECTED (A) NOT DETECTED Final    Comment: CRITICAL RESULT CALLED TO, READ BACK BY AND VERIFIED WITH: PHARMD JENNY ZHOU [phone] AT 0955 BY EC    Klebsiella aerogenes NOT DETECTED NOT DETECTED Final   Klebsiella oxytoca NOT DETECTED NOT DETECTED Final   Klebsiella pneumoniae NOT DETECTED NOT DETECTED Final   Proteus species NOT DETECTED NOT DETECTED Final   Salmonella  species NOT DETECTED NOT DETECTED Final   Serratia marcescens NOT DETECTED NOT DETECTED Final   Haemophilus influenzae NOT DETECTED NOT DETECTED Final   Neisseria meningitidis NOT DETECTED NOT DETECTED Final   Pseudomonas aeruginosa NOT DETECTED NOT DETECTED Final   Stenotrophomonas maltophilia NOT DETECTED NOT DETECTED Final   Candida albicans NOT DETECTED NOT DETECTED Final   Candida auris NOT DETECTED NOT DETECTED Final   Candida glabrata NOT DETECTED NOT DETECTED Final   Candida krusei NOT DETECTED NOT DETECTED Final   Candida parapsilosis NOT DETECTED NOT DETECTED Final   Candida tropicalis NOT DETECTED NOT DETECTED Final   Cryptococcus neoformans/gattii NOT DETECTED NOT DETECTED Final   CTX-M ESBL NOT DETECTED NOT DETECTED Final   Carbapenem resistance IMP NOT DETECTED NOT DETECTED Final   Carbapenem resistance KPC NOT DETECTED NOT DETECTED Final   Carbapenem resistance NDM NOT DETECTED NOT DETECTED Final   Carbapenem resist OXA 48 LIKE NOT DETECTED NOT DETECTED Final   Carbapenem resistance VIM NOT DETECTED NOT DETECTED Final    Comment: Performed at [facility], [address] [place], [place] [address]  Culture, blood (routine x 2)     Status: None (Preliminary result)   Collection Time: 06/21/24  4:54 PM   Specimen: BLOOD  Result Value Ref Range Status   Specimen Description BLOOD RIGHT ANTECUBITAL  Final   Special Requests   Final    BOTTLES DRAWN AEROBIC AND ANAEROBIC Blood Culture results may not be optimal due to an inadequate volume of blood received in culture bottles   Culture   Final    NO GROWTH 2 DAYS Performed at [facility], [address] [place], [place] [address]    Report Status PENDING  Incomplete      Radiology Studies: MR 3D Recon At Scanner Result Date: 06/22/2024 CLINICAL DATA:  Elevated liver function tests. EXAM: MRI ABDOMEN WITHOUT AND WITH CONTRAST (INCLUDING MRCP) TECHNIQUE: Multiplanar multisequence MR imaging of the abdomen was  performed both before and after the administration of intravenous contrast. Heavily T2-weighted images  of the biliary and pancreatic ducts were obtained, and three-dimensional MRCP images were rendered by post processing. CONTRAST:  7mL GADAVIST  GADOBUTROL  1 MMOL/ML IV SOLN COMPARISON:  CT 06/21/2024 FINDINGS: Exam is degraded patient respiratory motion. Patient has difficulty falling breath hold commands. Lower chest:  Lung bases are clear. Hepatobiliary: No intrahepatic biliary duct dilatation. No hepatic steatosis. Normal liver parenchyma. Within the lateral segment LEFT hepatic lobe 13 mm lesion is hyperintense on T2 weighted imaging corresponds to lesion of concern on comparison CT. There is motion degradation of the postcontrast T1 weighted imaging however lesion appears to have peripheral enhancement which is typical of benign hemangiomas. MRCP sequences are degraded by patient respiratory motion. No gallstones are evident. No biliary duct dilatation evident. No choledocholithiasis. Common bile duct measures 5 mm (image 21/series 3). Pancreas: Normal pancreatic parenchymal intensity. No ductal dilatation or inflammation. Spleen: Normal spleen. Adrenals/urinary tract: Adrenal glands and kidneys are normal. Stomach/Bowel: Stomach and limited of the small bowel is unremarkable Vascular/Lymphatic: Abdominal aortic normal caliber. No retroperitoneal periportal lymphadenopathy. Musculoskeletal: No aggressive osseous lesion IMPRESSION: 1. Certain sequences are severely degraded by patient respiratory motion. 2. No evidence acute cholecystitis. 3. No choledocholithiasis. 4. Normal common hepatic duct and common bile duct. No evidence of biliary obstruction. 5. No hepatic steatosis. 6. Probable benign hemangioma in the RIGHT hepatic lobe. Electronically Signed   By: Jackquline Boxer M.D.   On: 06/22/2024 16:06   MR ABDOMEN MRCP W WO CONTAST Result Date: 06/22/2024 CLINICAL DATA:  Elevated liver function tests. EXAM:  MRI ABDOMEN WITHOUT AND WITH CONTRAST (INCLUDING MRCP) TECHNIQUE: Multiplanar multisequence MR imaging of the abdomen was performed both before and after the administration of intravenous contrast. Heavily T2-weighted images of the biliary and pancreatic ducts were obtained, and three-dimensional MRCP images were rendered by post processing. CONTRAST:  7mL GADAVIST  GADOBUTROL  1 MMOL/ML IV SOLN COMPARISON:  CT 06/21/2024 FINDINGS: Exam is degraded patient respiratory motion. Patient has difficulty falling breath hold commands. Lower chest:  Lung bases are clear. Hepatobiliary: No intrahepatic biliary duct dilatation. No hepatic steatosis. Normal liver parenchyma. Within the lateral segment LEFT hepatic lobe 13 mm lesion is hyperintense on T2 weighted imaging corresponds to lesion of concern on comparison CT. There is motion degradation of the postcontrast T1 weighted imaging however lesion appears to have peripheral enhancement which is typical of benign hemangiomas. MRCP sequences are degraded by patient respiratory motion. No gallstones are evident. No biliary duct dilatation evident. No choledocholithiasis. Common bile duct measures 5 mm (image 21/series 3). Pancreas: Normal pancreatic parenchymal intensity. No ductal dilatation or inflammation. Spleen: Normal spleen. Adrenals/urinary tract: Adrenal glands and kidneys are normal. Stomach/Bowel: Stomach and limited of the small bowel is unremarkable Vascular/Lymphatic: Abdominal aortic normal caliber. No retroperitoneal periportal lymphadenopathy. Musculoskeletal: No aggressive osseous lesion IMPRESSION: 1. Certain sequences are severely degraded by patient respiratory motion. 2. No evidence acute cholecystitis. 3. No choledocholithiasis. 4. Normal common hepatic duct and common bile duct. No evidence of biliary obstruction. 5. No hepatic steatosis. 6. Probable benign hemangioma in the RIGHT hepatic lobe. Electronically Signed   By: Jackquline Boxer M.D.   On:  06/22/2024 16:06   US  Abdomen Limited RUQ (LIVER/GB) Result Date: 06/21/2024 CLINICAL DATA:  Upper abdominal pain EXAM: ULTRASOUND ABDOMEN LIMITED RIGHT UPPER QUADRANT COMPARISON:  CT 06/21/2024 FINDINGS: Gallbladder: No shadowing stones. Gallbladder distension. Focal wall thickening measuring up to 8 mm at the fundus. Negative sonographic Murphy. Common bile duct: Diameter: 7.1 mm Liver: Echogenic nodule left hepatic lobe measuring 14 x  14 x 15 mm likely corresponding to hypodensity on CT. Portal vein is patent on color Doppler imaging with normal direction of blood flow towards the liver. Other: None. IMPRESSION: 1. Gallbladder distension with focal echogenic wall thickening at the fundus. No shadowing stones. Negative sonographic Murphy. Findings are nonspecific and could be due to focal gallbladder wall inflammatory process. Gallbladder wall thickening secondary to neoplasm is possible but no masslike features on previously performed CT. 2. Common bile duct upper normal at 7.1 mm. Correlate with LFTs. 3. 15 mm echogenic nodule in the left hepatic lobe likely corresponding to hypodensity on CT, possible hemangioma. This could be confirmed with nonemergent MRI if desired given patient age. Electronically Signed   By: Luke Bun M.D.   On: 06/21/2024 21:13   CT ABDOMEN PELVIS W CONTRAST Result Date: 06/21/2024 CLINICAL DATA:  Abdomen pain EXAM: CT ABDOMEN AND PELVIS WITH CONTRAST TECHNIQUE: Multidetector CT imaging of the abdomen and pelvis was performed using the standard protocol following bolus administration of intravenous contrast. RADIATION DOSE REDUCTION: This exam was performed according to the departmental dose-optimization program which includes automated exposure control, adjustment of the mA and/or kV according to patient size and/or use of iterative reconstruction technique. CONTRAST:  75mL OMNIPAQUE  IOHEXOL  350 MG/ML SOLN COMPARISON:  CT 03/14/2020 FINDINGS: Lower chest: Lung bases demonstrate  mild subpleural scarring. No acute airspace disease. Pectus deformity of the lower anterior chest wall. Hepatobiliary: Distended gallbladder. No calcified stones. Possible trace pericholecystic fluid versus gallbladder wall thickening on coronal images. No significant biliary dilatation. Indeterminate hypodensity in the left hepatic lobe measuring about 13 mm on series 3, image 21. Pancreas: Atrophic.  No acute inflammation Spleen: Normal in size without focal abnormality. Adrenals/Urinary Tract: Adrenal glands are normal. Kidneys show slight prominence of collecting systems but no hydronephrosis or obstructing stone. Bladder is unremarkable Stomach/Bowel: The stomach is nonenlarged. No dilated small bowel. Diverticular disease of the left colon. No acute wall thickening Vascular/Lymphatic: Aortic atherosclerosis. No enlarged abdominal or pelvic lymph nodes. Reproductive: Status post hysterectomy. No adnexal masses. Other: Negative for pelvic effusion or free air. Musculoskeletal: No acute or suspicious osseous abnormality. Multilevel degenerative change IMPRESSION: 1. Distended gallbladder with possible trace pericholecystic fluid versus gallbladder wall thickening. Suggest correlation with right upper quadrant ultrasound. 2. Diverticular disease of the left colon without acute inflammatory process. 3. Indeterminate 13 mm hypodensity in the left hepatic lobe. When the patient is clinically stable and able to follow directions and hold their breath (preferably as an outpatient) further evaluation with dedicated abdominal MRI should be considered if deemed clinically appropriate 4. Aortic atherosclerosis. Aortic Atherosclerosis (ICD10-I70.0). Electronically Signed   By: Luke Bun M.D.   On: 06/21/2024 17:23       LOS: 2 days   Steel Kerney Verdene  [facility] Pager on [email or web address]  06/23/2024, 10:40 AM

## 2024-06-23 NOTE — Evaluation (Signed)
 Physical Therapy Evaluation Patient Details Name: [name] MRN: [id number] DOB: 1932/03/01 Today's Date: 06/23/2024  History of Present Illness  Pt is an 89 y.o. female presenting 7/5 with RUQ abdominal pain radiating to back, nausea. Found to have suspected cholecystitis, hyponatremia, bacteriuria, hematuria. [facility] ED visit 7/3 Lab work demonstrates acute liver injury but pt leaving without being seen. PMH: COPD, PAD, HLD, OSA, breast cancer.   Clinical Impression  Pt admitted with above. Pt OOB mobility greatly limited by onset of nausea and feeling like she is going to pace out. General surgery PA came into room to discuss potential surgery regarding gall bladder. At this time pt would need 24/7 assist for safe return home due to progressive weakness, increased fall risk, and pt requiring increased assist for all mobility and ADLs. Acute PT to cont to follow and re-assess d/c recommendations.        If plan is discharge home, recommend the following: A little help with walking and/or transfers;A little help with bathing/dressing/bathroom;Assist for transportation;Supervision due to cognitive status (initially 24/7 assist however will re-assess pending potential surgery)   Can travel by private vehicle        Equipment Recommendations None recommended by PT (has RW)  Recommendations for Other Services       Functional Status Assessment Patient has had a recent decline in their functional status and demonstrates the ability to make significant improvements in function in a reasonable and predictable amount of time.     Precautions / Restrictions Precautions Precautions: Fall Restrictions Weight Bearing Restrictions Per Provider Order: No      Mobility  Bed Mobility Overal bed mobility: Needs Assistance Bed Mobility: Sit to Supine       Sit to supine: Min assist   General bed mobility comments: minA for LE management back into bed, max verbal cues to get up high in the  bed and to reach back for railing prior to sitting    Transfers Overall transfer level: Needs assistance Equipment used: Rolling walker (2 wheels) Transfers: Sit to/from Stand Sit to Stand: Min assist           General transfer comment: pt with tendency to pull up on RW,    Ambulation/Gait Ambulation/Gait assistance: Min assist Gait Distance (Feet): 12 Feet Assistive device: Rolling walker (2 wheels) Gait Pattern/deviations: Step-to pattern, Decreased stride length Gait velocity: dec Gait velocity interpretation: <1.31 ft/sec, indicative of household ambulator   General Gait Details: short, shuffled pattern due to c/o i'm going to throw up . pt with fear of falling.  Stairs            Wheelchair Mobility     Tilt Bed    Modified Rankin (Stroke Patients Only)       Balance Overall balance assessment: Needs assistance Sitting-balance support: No upper extremity supported, Feet supported Sitting balance-Leahy Scale: Good     Standing balance support: Bilateral upper extremity supported, Single extremity supported, During functional activity Standing balance-Leahy Scale: Poor Standing balance comment: poor to fair, benefits from RW, able to stand with unilateral UE support to perform pericare after tolieting                             Pertinent Vitals/Pain Pain Assessment Pain Assessment: Faces Faces Pain Scale: Hurts a little bit Pain Location: abdomen Pain Descriptors / Indicators: Sore Pain Intervention(s): Limited activity within patient's tolerance    Home Living Family/patient expects to be  discharged to:: Private residence Living Arrangements: Alone Available Help at Discharge: Family;Personal care attendant (5 days/week 5 hours/day 10-3) Type of Home: Other(Comment) (condo) Home Access: Level entry       Home Layout: One level Home Equipment: Agricultural consultant (2 wheels);Rollator (4 wheels);Cane - single point;Shower seat - built  in;Grab bars - tub/shower;Hand held shower head;Grab bars - toilet;Transport chair      Prior Function Prior Level of Function : Independent/Modified Independent;Needs assist             Mobility Comments: use of RW in the home ADLs Comments: PCA prepared breakfaast/lunch, laundry, set-up for medication management, grocery shopping, supervises showers, drives to appts. Pt otherwise mod I in ADL     Extremity/Trunk Assessment   Upper Extremity Assessment Upper Extremity Assessment: Right hand dominant    Lower Extremity Assessment Lower Extremity Assessment: Generalized weakness    Cervical / Trunk Assessment Cervical / Trunk Assessment: Normal  Communication   Communication Communication: No apparent difficulties    Cognition Arousal: Alert Behavior During Therapy: WFL for tasks assessed/performed                           PT - Cognition Comments: pt reporting I feel like i'm going to die, I just cant get well. pt distracted by nausea leading to delayed processing Following commands: Intact       Cueing Cueing Techniques: Verbal cues     General Comments General comments (skin integrity, edema, etc.): c/o nausea when mobilizing    Exercises     Assessment/Plan    PT Assessment Patient needs continued PT services  PT Problem List Decreased strength;Decreased activity tolerance;Decreased balance;Decreased mobility       PT Treatment Interventions DME instruction;Gait training;Functional mobility training;Therapeutic activities;Therapeutic exercise;Balance training    PT Goals (Current goals can be found in the Care Plan section)  Acute Rehab PT Goals Patient Stated Goal: feel better PT Goal Formulation: With patient/family Time For Goal Achievement: 07/07/24 Potential to Achieve Goals: Good    Frequency Min 2X/week     Co-evaluation               AM-PAC PT 6 Clicks Mobility  Outcome Measure Help needed turning from your back  to your side while in a flat bed without using bedrails?: A Little Help needed moving from lying on your back to sitting on the side of a flat bed without using bedrails?: A Little Help needed moving to and from a bed to a chair (including a wheelchair)?: A Little Help needed standing up from a chair using your arms (e.g., wheelchair or bedside chair)?: A Little Help needed to walk in hospital room?: A Lot Help needed climbing 3-5 steps with a railing? : Total 6 Click Score: 15    End of Session   Activity Tolerance: Other (comment) (limited by nausea) Patient left: in bed;with call bell/phone within reach;with family/visitor present (general surgery PA) Nurse Communication: Mobility status PT Visit Diagnosis: Unsteadiness on feet (R26.81)    Time: 1049-1100 PT Time Calculation (min) (ACUTE ONLY): 11 min   Charges:   PT Evaluation $PT Eval Low Complexity: 1 Low   PT General Charges $$ ACUTE PT VISIT: 1 Visit         Norene Ames, PT, DPT Acute Rehabilitation Services Secure chat preferred Office #: [phone]   Norene CHRISTELLA Ames 06/23/2024, 12:23 PM

## 2024-06-23 NOTE — Discharge Instructions (Signed)

## 2024-06-24 ENCOUNTER — Inpatient Hospital Stay (HOSPITAL_COMMUNITY)

## 2024-06-24 DIAGNOSIS — R7989 Other specified abnormal findings of blood chemistry: Secondary | ICD-10-CM | POA: Diagnosis not present

## 2024-06-24 DIAGNOSIS — R1011 Right upper quadrant pain: Secondary | ICD-10-CM | POA: Diagnosis not present

## 2024-06-24 LAB — CBC
HCT: 32.8 % — ABNORMAL LOW (ref 36.0–46.0)
Hemoglobin: 11.1 g/dL — ABNORMAL LOW (ref 12.0–15.0)
MCH: 31 pg (ref 26.0–34.0)
MCHC: 33.8 g/dL (ref 30.0–36.0)
MCV: 91.6 fL (ref 80.0–100.0)
Platelets: 121 K/uL — ABNORMAL LOW (ref 150–400)
RBC: 3.58 MIL/uL — ABNORMAL LOW (ref 3.87–5.11)
RDW: 13.3 % (ref 11.5–15.5)
WBC: 7.9 K/uL (ref 4.0–10.5)
nRBC: 0 % (ref 0.0–0.2)

## 2024-06-24 LAB — COMPREHENSIVE METABOLIC PANEL WITH GFR
ALT: 97 U/L — ABNORMAL HIGH (ref 0–44)
AST: 22 U/L (ref 15–41)
Albumin: 2.7 g/dL — ABNORMAL LOW (ref 3.5–5.0)
Alkaline Phosphatase: 77 U/L (ref 38–126)
Anion gap: 12 (ref 5–15)
BUN: 5 mg/dL — ABNORMAL LOW (ref 8–23)
CO2: 22 mmol/L (ref 22–32)
Calcium: 8.3 mg/dL — ABNORMAL LOW (ref 8.9–10.3)
Chloride: 103 mmol/L (ref 98–111)
Creatinine, Ser: 0.61 mg/dL (ref 0.44–1.00)
GFR, Estimated: >60 mL/min (ref >60)
Glucose, Bld: 94 mg/dL (ref 70–99)
Potassium: 2.9 mmol/L — ABNORMAL LOW (ref 3.5–5.1)
Sodium: 137 mmol/L (ref 135–145)
Total Bilirubin: 0.9 mg/dL (ref 0.0–1.2)
Total Protein: 5.6 g/dL — ABNORMAL LOW (ref 6.5–8.1)

## 2024-06-24 LAB — CULTURE, BLOOD (ROUTINE X 2): Special Requests: ADEQUATE

## 2024-06-24 LAB — VITAMIN B12: Vitamin B-12: 941 pg/mL — ABNORMAL HIGH (ref 180–914)

## 2024-06-24 LAB — RETICULOCYTES
Immature Retic Fract: 12.6 % (ref 2.3–15.9)
RBC.: 3.56 MIL/uL — ABNORMAL LOW (ref 3.87–5.11)
Retic Count, Absolute: 44.9 K/uL (ref 19.0–186.0)
Retic Ct Pct: 1.3 % (ref 0.4–3.1)

## 2024-06-24 LAB — FOLATE: Folate: 16 ng/mL (ref >5.9)

## 2024-06-24 LAB — IRON AND TIBC
Iron: 18 ug/dL — ABNORMAL LOW (ref 28–170)
Saturation Ratios: 8 % — ABNORMAL LOW (ref 10.4–31.8)
TIBC: 221 ug/dL — ABNORMAL LOW (ref 250–450)
UIBC: 203 ug/dL

## 2024-06-24 LAB — BRAIN NATRIURETIC PEPTIDE: B Natriuretic Peptide: 622.4 pg/mL — ABNORMAL HIGH (ref 0.0–100.0)

## 2024-06-24 LAB — FERRITIN: Ferritin: 210 ng/mL (ref 11–307)

## 2024-06-24 MED ORDER — MORPHINE SULFATE (PF) 4 MG/ML IV SOLN
2.7000 mg | Freq: Once | INTRAVENOUS | Status: AC
  Administered 2024-06-24: 2.7 mg via INTRAVENOUS
  Filled 2024-06-24: qty 1

## 2024-06-24 MED ORDER — FUROSEMIDE 10 MG/ML IJ SOLN
INTRAMUSCULAR | Status: AC
  Administered 2024-06-24: 40 mg via INTRAVENOUS
  Filled 2024-06-24: qty 4

## 2024-06-24 MED ORDER — TECHNETIUM TC 99M MEBROFENIN IV KIT
5.0000 | PACK | Freq: Once | INTRAVENOUS | Status: AC | PRN
  Administered 2024-06-24: 5 via INTRAVENOUS

## 2024-06-24 MED ORDER — POTASSIUM CHLORIDE CRYS ER 20 MEQ PO TBCR
40.0000 meq | EXTENDED_RELEASE_TABLET | Freq: Once | ORAL | Status: AC
  Administered 2024-06-24: 40 meq via ORAL
  Filled 2024-06-24: qty 2

## 2024-06-24 MED ORDER — BENZONATATE 100 MG PO CAPS
100.0000 mg | ORAL_CAPSULE | Freq: Three times a day (TID) | ORAL | Status: DC | PRN
  Administered 2024-06-24: 100 mg via ORAL
  Filled 2024-06-24: qty 1

## 2024-06-24 MED ORDER — FUROSEMIDE 10 MG/ML IJ SOLN: 40.0000 mg | Freq: Once | INTRAMUSCULAR | Status: AC

## 2024-06-24 MED ORDER — POTASSIUM CHLORIDE 10 MEQ/100ML IV SOLN
10.0000 meq | INTRAVENOUS | Status: DC
  Administered 2024-06-24: 10 meq via INTRAVENOUS
  Filled 2024-06-24 (×6): qty 100

## 2024-06-24 NOTE — Progress Notes (Signed)
 Progress Note     Subjective: Pt feeling nauseated this AM but attributes it to not eating or drinking for HIDA. She does not have much pain at basline. Daughter at bedside this AM.   Objective: Vital signs in last 24 hours: Temp:  [97.5 F (36.4 C)-98.9 F (37.2 C)] 97.5 F (36.4 C) (07/08 0803) Pulse Rate:  [69-81] 73 (07/08 0803) Resp:  [16-18] 18 (07/08 0328) BP: (137-166)/(60-75) 142/75 (07/08 0803) SpO2:  [90 %-94 %] 90 % (07/08 0803) Last BM Date : 06/23/24  Intake/Output from previous day: 07/07 0701 - 07/08 0700 In: 1095.5 [P.O.:708; I.V.:387.5] Out: 400 [Urine:400] Intake/Output this shift: No intake/output data recorded.  PE: General: pleasant, WD, elderly female who is laying in bed in NAD HEENT: sclera anicteric  Heart: regular, rate, and rhythm.   Lungs:  Respiratory effort nonlabored Abd: soft, TTP in RUQ and RLQ with positive murphy sign, ND Skin: not jaundiced   Lab Results:  Recent Labs    06/23/24 0249 06/24/24 0826  WBC 10.8* 7.9  HGB 11.6* 11.1*  HCT 34.7* 32.8*  PLT 115* 121*   BMET Recent Labs    06/23/24 0249 06/24/24 0826  NA 137 137  K 3.6 2.9*  CL 104 103  CO2 24 22  GLUCOSE 109* 94  BUN 7* 5*  CREATININE 0.67 0.61  CALCIUM  8.3* 8.3*   PT/INR No results for input(s): LABPROT, INR in the last 72 hours. CMP     Component Value Date/Time   NA 137 06/24/2024 0826   K 2.9 (L) 06/24/2024 0826   CL 103 06/24/2024 0826   CO2 22 06/24/2024 0826   GLUCOSE 94 06/24/2024 0826   BUN 5 (L) 06/24/2024 0826   CREATININE 0.61 06/24/2024 0826   CREATININE 0.74 05/05/2024 1343   CALCIUM  8.3 (L) 06/24/2024 0826   PROT 5.6 (L) 06/24/2024 0826   ALBUMIN 2.7 (L) 06/24/2024 0826   AST 22 06/24/2024 0826   AST 15 05/05/2024 1343   ALT 97 (H) 06/24/2024 0826   ALT 11 05/05/2024 1343   ALKPHOS 77 06/24/2024 0826   BILITOT 0.9 06/24/2024 0826   BILITOT 0.5 05/05/2024 1343   GFRNONAA >60 06/24/2024 0826   GFRNONAA >60 05/05/2024  1343   GFRAA >60 09/08/2020 1233   Lipase     Component Value Date/Time   LIPASE 27 06/21/2024 1532       Studies/Results: DG CHEST PORT 1 VIEW Result Date: 06/23/2024 CLINICAL DATA:  Cough EXAM: PORTABLE CHEST 1 VIEW COMPARISON:  Chest radiograph February 18, 2024 FINDINGS: The heart size and mediastinal contours are within normal limits. Increased interstitial markings of both lung fields without significant consolidation or suspicious pulmonary nodule, increased to prior. Atherosclerotic calcifications of aortic knob and descending aorta. Chronic left rib fractures with mild displacement. No pleural effusion or pneumothorax. IMPRESSION: Diffuse increased interstitial markings of both lung fields without focal consolidation or suspicious pulmonary nodules which can be seen the setting of infection/inflammation, or early pulmonary edema. Correlate with clinical findings. . Electronically Signed   By: Megan  Zare M.D.   On: 06/23/2024 17:15   MR 3D Recon At Scanner Result Date: 06/22/2024 CLINICAL DATA:  Elevated liver function tests. EXAM: MRI ABDOMEN WITHOUT AND WITH CONTRAST (INCLUDING MRCP) TECHNIQUE: Multiplanar multisequence MR imaging of the abdomen was performed both before and after the administration of intravenous contrast. Heavily T2-weighted images of the biliary and pancreatic ducts were obtained, and three-dimensional MRCP images were rendered by post processing. CONTRAST:  7mL  GADAVIST  GADOBUTROL  1 MMOL/ML IV SOLN COMPARISON:  CT 06/21/2024 FINDINGS: Exam is degraded patient respiratory motion. Patient has difficulty falling breath hold commands. Lower chest:  Lung bases are clear. Hepatobiliary: No intrahepatic biliary duct dilatation. No hepatic steatosis. Normal liver parenchyma. Within the lateral segment LEFT hepatic lobe 13 mm lesion is hyperintense on T2 weighted imaging corresponds to lesion of concern on comparison CT. There is motion degradation of the postcontrast T1 weighted  imaging however lesion appears to have peripheral enhancement which is typical of benign hemangiomas. MRCP sequences are degraded by patient respiratory motion. No gallstones are evident. No biliary duct dilatation evident. No choledocholithiasis. Common bile duct measures 5 mm (image 21/series 3). Pancreas: Normal pancreatic parenchymal intensity. No ductal dilatation or inflammation. Spleen: Normal spleen. Adrenals/urinary tract: Adrenal glands and kidneys are normal. Stomach/Bowel: Stomach and limited of the small bowel is unremarkable Vascular/Lymphatic: Abdominal aortic normal caliber. No retroperitoneal periportal lymphadenopathy. Musculoskeletal: No aggressive osseous lesion IMPRESSION: 1. Certain sequences are severely degraded by patient respiratory motion. 2. No evidence acute cholecystitis. 3. No choledocholithiasis. 4. Normal common hepatic duct and common bile duct. No evidence of biliary obstruction. 5. No hepatic steatosis. 6. Probable benign hemangioma in the RIGHT hepatic lobe. Electronically Signed   By: Jackquline Boxer M.D.   On: 06/22/2024 16:06   MR ABDOMEN MRCP W WO CONTAST Result Date: 06/22/2024 CLINICAL DATA:  Elevated liver function tests. EXAM: MRI ABDOMEN WITHOUT AND WITH CONTRAST (INCLUDING MRCP) TECHNIQUE: Multiplanar multisequence MR imaging of the abdomen was performed both before and after the administration of intravenous contrast. Heavily T2-weighted images of the biliary and pancreatic ducts were obtained, and three-dimensional MRCP images were rendered by post processing. CONTRAST:  7mL GADAVIST  GADOBUTROL  1 MMOL/ML IV SOLN COMPARISON:  CT 06/21/2024 FINDINGS: Exam is degraded patient respiratory motion. Patient has difficulty falling breath hold commands. Lower chest:  Lung bases are clear. Hepatobiliary: No intrahepatic biliary duct dilatation. No hepatic steatosis. Normal liver parenchyma. Within the lateral segment LEFT hepatic lobe 13 mm lesion is hyperintense on T2  weighted imaging corresponds to lesion of concern on comparison CT. There is motion degradation of the postcontrast T1 weighted imaging however lesion appears to have peripheral enhancement which is typical of benign hemangiomas. MRCP sequences are degraded by patient respiratory motion. No gallstones are evident. No biliary duct dilatation evident. No choledocholithiasis. Common bile duct measures 5 mm (image 21/series 3). Pancreas: Normal pancreatic parenchymal intensity. No ductal dilatation or inflammation. Spleen: Normal spleen. Adrenals/urinary tract: Adrenal glands and kidneys are normal. Stomach/Bowel: Stomach and limited of the small bowel is unremarkable Vascular/Lymphatic: Abdominal aortic normal caliber. No retroperitoneal periportal lymphadenopathy. Musculoskeletal: No aggressive osseous lesion IMPRESSION: 1. Certain sequences are severely degraded by patient respiratory motion. 2. No evidence acute cholecystitis. 3. No choledocholithiasis. 4. Normal common hepatic duct and common bile duct. No evidence of biliary obstruction. 5. No hepatic steatosis. 6. Probable benign hemangioma in the RIGHT hepatic lobe. Electronically Signed   By: Jackquline Boxer M.D.   On: 06/22/2024 16:06    Anti-infectives: Anti-infectives (From admission, onward)    Start     Dose/Rate Route Frequency Ordered Stop   06/22/24 1700  cefTRIAXone  (ROCEPHIN ) 2 g in sodium chloride  0.9 % 100 mL IVPB        2 g 200 mL/hr over 30 Minutes Intravenous Every 24 hours 06/21/24 1932     06/22/24 0500  metroNIDAZOLE  (FLAGYL ) IVPB 500 mg        500 mg 100 mL/hr over 60  Minutes Intravenous Every 12 hours 06/21/24 1932     06/21/24 1745  cefTRIAXone  (ROCEPHIN ) 2 g in sodium chloride  0.9 % 100 mL IVPB       Placed in And Linked Group   2 g 200 mL/hr over 30 Minutes Intravenous  Once 06/21/24 1730 06/21/24 1814   06/21/24 1745  metroNIDAZOLE  (FLAGYL ) IVPB 500 mg       Placed in And Linked Group   500 mg 100 mL/hr over 60  Minutes Intravenous  Once 06/21/24 1730 06/21/24 1857        Assessment/Plan Elevated LFTs and RUQ pain Possible acalculous cholecystitis   - imaging without cholelithiasis or choledocholithiasis, equivocal for cholecystitis  - LFTs trending down from admit - clinically patient does have RUQ TTP with positive Murphy sign - WBC normalized, however pt with E.coli and Enterobacterales in blood Cxs and UA that is nitrate positive with many bacteria - would recommend Urine Cx  - will get HIDA today to better assess - if patient felt to have acalculous cholecystitis will need to determine whether abx alone vs percutaneous cholecystostomy vs cholecystectomy would be the best course of management. In setting of acalculous cholecystitis percutaneous cholecystostomy is often definitive management.   FEN: NPO for HIDA, IVF per [facility] VTE: ok to have LMWH or SQH from surgery standpoint ID: rocephin /flagyl   - Per [facility] -  ?UTI - asymptomatic, would still recommend UCx Bacteremia - blood Cx with E. Coli and Enterobacterales, on ABX PAD Hx of COPD Hx breast cancer s/p lumpectomy  Normocytic anemia/thrombocytopenia     LOS: 3 days   I reviewed hospitalist notes, last 24 h vitals and pain scores, last 48 h intake and output, last 24 h labs and trends, and last 24 h imaging results.  This care required moderate level of medical decision making.    [name] Louder, [facility] Surgery 06/24/2024, 10:47 AM Please see Amion for pager number during day hours 7:00am-4:30pm

## 2024-06-24 NOTE — Progress Notes (Addendum)
 Informed by RN that patient is having a hacking dry cough and dyspnea with exertion.  Not hypoxic.  Rhonchi and crackles heard on auscultation of the lungs.  RT has given the patient albuterol  neb treatment. Also RN concerned that patient is receiving bags of IV potassium which may lead to volume overload.  She has not received any diuretics.  Most recent vital signs: Temperature 98.9 F, heart rate 113, respiratory rate 20, blood pressure 112/52 (MAP 69), and SpO2 93% on room air.  Chart reviewed, chest x-ray done yesterday was concerning for possible pneumonia versus pulmonary edema.  No fever or leukocytosis on labs today.  Patient is already on antibiotics.  BNP 622 on labs this morning.  Creatinine 0.6.   Potassium 2.9 on labs this morning.  Patient has received oral potassium 40 mEq x 1 and IV potassium 10 mEq x 6 was ordered.  Informed by RN that patient has received 1 bag of IV potassium so far.  So, in total, patient has received 50 mEq potassium since morning.  Echo done 06/09/2022 showing EF 60 to 65%, grade 1 diastolic dysfunction, trivial mitral regurgitation.  -IV Lasix  40 mg x 1 ordered. -Repeat echocardiogram ordered -IV potassium discontinued.  Will give oral potassium 40 mEq x 1.  Repeat labs in the morning to check potassium level. -Tessalon  Perles PRN cough

## 2024-06-24 NOTE — TOC Initial Note (Signed)
 Transition of Care [facility]) - Initial/Assessment Note    Patient Details  Name: [name] MRN: [id number] Date of Birth: [birth year]  [facility] CM/SW Contact:    Lauraine FORBES Saa, LCSW Phone Number: 06/24/2024, 3:58 PM  Clinical Narrative:                  3:58 PM Per Occupational Therapist, patient and patient's family expressed interest in insurance covering PCS that are currently being provided through private pay. CSW informed Acupuncturist and medical team that Medicaid covers PCS, not Medicare.  Expected Discharge Plan: Home w Home Health Services Barriers to Discharge: Continued Medical Work up   Patient Goals and CMS Choice Patient states their goals for this hospitalization and ongoing recovery are:: to return home          Expected Discharge Plan and Services   Discharge Planning Services: CM Consult Post Acute Care Choice: Home Health Living arrangements for the past 2 months: Single Family Home                                      Prior Living Arrangements/Services Living arrangements for the past 2 months: Single Family Home Lives with:: Self Patient language and need for interpreter reviewed:: Yes              Criminal Activity/Legal Involvement Pertinent to Current Situation/Hospitalization: No - Comment as needed  Activities of Daily Living   ADL Screening (condition at time of admission) Independently performs ADLs?: No Does the patient have a NEW difficulty with bathing/dressing/toileting/self-feeding that is expected to last >3 days?: Yes (Initiates electronic notice to provider for possible OT consult) Does the patient have a NEW difficulty with getting in/out of bed, walking, or climbing stairs that is expected to last >3 days?: Yes (Initiates electronic notice to provider for possible PT consult) Does the patient have a NEW difficulty with communication that is expected to last >3 days?: No Is the patient deaf  or have difficulty hearing?: Yes Does the patient have difficulty seeing, even when wearing glasses/contacts?: No Does the patient have difficulty concentrating, remembering, or making decisions?: No  Permission Sought/Granted Permission sought to share information with : Family Supports Permission granted to share information with : Yes, Verbal Permission Granted  Share Information with NAME: [name] Console     Permission granted to share info w Relationship: Daughter  Permission granted to share info w Contact Information: [phone]  Emotional Assessment Appearance:: Appears stated age Attitude/Demeanor/Rapport: Engaged Affect (typically observed): Stable, Calm, Accepting, Adaptable, Pleasant, Appropriate Orientation: : Oriented to Place, Oriented to  Time, Oriented to Situation, Oriented to Self Alcohol / Substance Use: Not Applicable Psych Involvement: No (comment)  Admission diagnosis:  Cholecystitis [K81.9] Diverticulitis [K57.92] Sepsis, due to unspecified organism, unspecified whether acute organ dysfunction present [facility]) [A41.9] Patient Active Problem List   Diagnosis Date Noted   Cholecystitis 06/21/2024   Abnormal LFTs 06/21/2024   Hyponatremia 06/21/2024   Asymptomatic bacteriuria 06/21/2024   Family history of breast cancer 09/09/2020   Carcinoma of lower-inner quadrant of right breast in female, estrogen receptor positive (HCC) 09/08/2020   Malignant neoplasm of upper-inner quadrant of right breast in female, estrogen receptor positive (HCC) 09/03/2020   Rib fractures 03/14/2020   Vertigo 09/13/2017   Abnormal finding on imaging 04/22/2012   Dyslipidemia 04/22/2012   Depression 04/22/2012   Anxiety 04/22/2012   Tobacco abuse  04/22/2012   COPD (chronic obstructive pulmonary disease) (HCC) 04/22/2012   Gait abnormality 04/22/2012   PCP:  [name], MD Pharmacy:   [id number] [address], [place] - [address] [place]  [address] Phone: [phone] Fax: [phone]     Social Drivers of Health (SDOH) Social History: SDOH Screenings   Food Insecurity: No Food Insecurity (06/22/2024)  Housing: Low Risk  (06/22/2024)  Transportation Needs: No Transportation Needs (06/22/2024)  Utilities: Not At Risk (06/22/2024)  Social Connections: Socially Isolated (06/22/2024)  Tobacco Use: Medium Risk (06/21/2024)   SDOH Interventions: Social Connections Interventions: [facility] Provided, Inpatient TOC   Readmission Risk Interventions     No data to display

## 2024-06-24 NOTE — Plan of Care (Signed)

## 2024-06-24 NOTE — Care Management Important Message (Signed)
 Important Message  Patient Details  Name: [name] MRN: [id number] Date of Birth: 03/29/1932   Important Message Given:  Yes - Medicare IM     Claretta Deed 06/24/2024, 3:56 PM

## 2024-06-24 NOTE — Progress Notes (Signed)
 [facility] PROGRESS NOTE   [name] FMW:[id number] DOB: 21-Feb-1932 DOA: 06/21/2024  PCP: [name], MD  Brief History: 89 y.o. female with hx of COPD, PAD, hyperlipidemia, OSA, right breast invasive ductal carcinoma stage Ia s/p lumpectomy, radiation, hormone therapy, who presented with right upper quadrant abdominal pain radiating to the back and nausea and vomiting.  Patient was found to have abnormal LFTs.  She was hospitalized for further management.    Consultants: General Surgery has been consulted  Procedures: None yet   Subjective/Interval History: Patient feels well this morning.  Abdominal pain has improved.  Denies any nausea vomiting.  Waiting on her HIDA scan today.  Daughter is at the bedside.    Assessment/Plan:  Abdominal pain/abnormal LFTs/concern for cholecystitis She was noted to have significantly elevated AST ALT bilirubin.  Bilirubin has been normal for the last 48 hours.  AST and ALT have improved as well. Imaging studies raises concern for cholecystitis.  Right upper quadrant ultrasound was nonspecific.  No gallstones were noted.  CBD was noted to be upper limit of normal.  Hepatic lobe lesion was noted thought to be hemangioma.   Patient was seen by general surgery.  They ordered MRI of the abdomen which was a poor study due to motion.  However no cholecystitis was evident. Patient is still tender in the right upper quadrant.  Blood cultures positive for E. coli.  Clinically she appears to have gallbladder disease but would defer further management to general surgery.  They have ordered a HIDA scan which is pending. Hepatitis panel is unremarkable. Continue with ceftriaxone  and metronidazole .  Acute encephalopathy with hallucinations Most likely due to hospital stay and Ativan .  No focal neurological deficits on examination.  Continue to monitor.  Reorient frequently. Seems to be better this morning.  E. coli bacteremia Most likely source  is gallbladder.  UA was noted to be abnormal however patient denies any urinary symptoms.  Urine culture is pending.  E. coli is pansensitive. Continue ceftriaxone .  Hyponatremia Resolved  Hypokalemia Supplemented.  Labs are pending from today.  Abnormal UA No symptoms of dysuria.  Is on ceftriaxone .  Continue to monitor.    History of COPD Patient has had coughing episodes the last few days.  Chest x-ray yesterday showed prominent interstitial markings.  Nonspecific finding.  Patient without any clinical signs of volume overload.  Continue to monitor.  She is saturating normal on room air.  Able to lie flat on the bed.  Denies any dyspnea per se.  Peripheral artery disease Holding aspirin .  Holding statin.  History of right breast carcinoma She is status post lumpectomy radiation and hormone treatment and is in remission.  Normocytic anemia/thrombocytopenia Could be related to acute illness.   Anemia panel is pending. Platelet counts are improving.   DVT Prophylaxis: SCDs Code Status: Full code Family Communication: Discussed with patient and her daughter Disposition Plan:  PT and OT eval.  Home health is recommended.    Medications: Scheduled:  buPROPion   150 mg Oral Daily   escitalopram   40 mg Oral Daily   pantoprazole   40 mg Oral Daily   sodium chloride  flush  3 mL Intravenous Q12H   umeclidinium bromide   1 puff Inhalation Daily   Continuous:  sodium chloride  75 mL/hr at 06/23/24 2155   cefTRIAXone  (ROCEPHIN )  IV 2 g (06/23/24 1828)   metronidazole  500 mg (06/24/24 0509)   PRN:acetaminophen , albuterol , melatonin, ondansetron  (ZOFRAN ) IV, oxyCODONE  **OR** oxyCODONE , polyethylene glycol  Antibiotics: Anti-infectives (  From admission, onward)    Start     Dose/Rate Route Frequency Ordered Stop   06/22/24 1700  cefTRIAXone  (ROCEPHIN ) 2 g in sodium chloride  0.9 % 100 mL IVPB        2 g 200 mL/hr over 30 Minutes Intravenous Every 24 hours 06/21/24 1932      06/22/24 0500  metroNIDAZOLE  (FLAGYL ) IVPB 500 mg        500 mg 100 mL/hr over 60 Minutes Intravenous Every 12 hours 06/21/24 1932     06/21/24 1745  cefTRIAXone  (ROCEPHIN ) 2 g in sodium chloride  0.9 % 100 mL IVPB       Placed in And Linked Group   2 g 200 mL/hr over 30 Minutes Intravenous  Once 06/21/24 1730 06/21/24 1814   06/21/24 1745  metroNIDAZOLE  (FLAGYL ) IVPB 500 mg       Placed in And Linked Group   500 mg 100 mL/hr over 60 Minutes Intravenous  Once 06/21/24 1730 06/21/24 1857       Objective:  Vital Signs  Vitals:   06/23/24 1539 06/23/24 1937 06/24/24 0328 06/24/24 0803  BP: (!) 165/63 137/60 (!) 166/67 (!) 142/75  Pulse: 69 69 81 73  Resp: 16 17 18    Temp: 98.4 F (36.9 C) 98.4 F (36.9 C) 98.9 F (37.2 C) (!) 97.5 F (36.4 C)  TempSrc: Oral Oral Oral   SpO2: 93% 94% 94% 90%  Weight:      Height:        Intake/Output Summary (Last 24 hours) at 06/24/2024 0953 Last data filed at 06/23/2024 2234 Gross per 24 hour  Intake 977.52 ml  Output 400 ml  Net 577.52 ml   Filed Weights   06/21/24 1525 06/21/24 2052  Weight: 67.1 kg 67.9 kg    General appearance: Awake alert.  In no distress Resp: Clear to auscultation bilaterally.  Normal effort Cardio: S1-S2 is normal regular.  No S3-S4.  No rubs murmurs or bruit GI: Abdomen is soft.  Nontender nondistended.  Bowel sounds are present normal.  No masses organomegaly No focal neurological deficits.  Lab Results:  Data Reviewed: I have personally reviewed following labs and reports of the imaging studies  CBC: Recent Labs  Lab 06/19/24 1918 06/21/24 1532 06/22/24 0510 06/23/24 0249 06/24/24 0826  WBC 4.6 14.0* 10.0 10.8* 7.9  NEUTROABS  --  11.8*  --   --   --   HGB 13.8 13.0 11.0* 11.6* 11.1*  HCT 41.0 37.3 32.7* 34.7* 32.8*  MCV 93.8 93.0 91.6 94.6 91.6  PLT 122* 102* 92* 115* 121*    Basic Metabolic Panel: Recent Labs  Lab 06/19/24 1918 06/21/24 1532 06/22/24 0510 06/23/24 0249  NA 138  133* 135 137  K 3.5 3.7 3.3* 3.6  CL 102 97* 104 104  CO2 21* 25 23 24   GLUCOSE 126* 99 100* 109*  BUN 15 26* 14 7*  CREATININE 0.87 0.93 0.76 0.67  CALCIUM  8.7* 8.9 8.0* 8.3*  MG  --   --  1.8  --   PHOS  --   --  2.6  --     GFR: Estimated Creatinine Clearance: 42 mL/min (by C-G formula based on SCr of 0.67 mg/dL).  Liver Function Tests: Recent Labs  Lab 06/19/24 1918 06/21/24 1532 06/22/24 0510 06/23/24 0249  AST 1,658* 155* 73* 40  ALT 958* 334* 223* 163*  ALKPHOS 117 103 89 90  BILITOT 3.2* 1.3* 0.9 0.8  PROT 6.4* 6.3* 5.3* 5.8*  ALBUMIN 3.8  3.2* 2.7* 2.9*    Recent Labs  Lab 06/19/24 1918 06/21/24 1532  LIPASE 31 27     Recent Results (from the past 240 hours)  Resp panel by RT-PCR (RSV, Flu A&B, Covid) Anterior Nasal Swab     Status: None   Collection Time: 06/19/24  7:04 PM   Specimen: Anterior Nasal Swab  Result Value Ref Range Status   SARS Coronavirus 2 by RT PCR NEGATIVE NEGATIVE Final    Comment: (NOTE) SARS-CoV-2 target nucleic acids are NOT DETECTED.  The SARS-CoV-2 RNA is generally detectable in upper respiratory specimens during the acute phase of infection. The lowest concentration of SARS-CoV-2 viral copies this assay can detect is 138 copies/mL. A negative result does not preclude SARS-Cov-2 infection and should not be used as the sole basis for treatment or other patient management decisions. A negative result may occur with  improper specimen collection/handling, submission of specimen other than nasopharyngeal swab, presence of viral mutation(s) within the areas targeted by this assay, and inadequate number of viral copies(<138 copies/mL). A negative result must be combined with clinical observations, patient history, and epidemiological information. The expected result is Negative.  Fact Sheet for Patients:  BloggerCourse.com  Fact Sheet for Healthcare Providers:   SeriousBroker.it  This test is no t yet approved or cleared by the [place]  FDA and  has been authorized for detection and/or diagnosis of SARS-CoV-2 by FDA under an Emergency Use Authorization (EUA). This EUA will remain  in effect (meaning this test can be used) for the duration of the COVID-19 declaration under Section 564(b)(1) of the Act, 21 U.S.C.section 360bbb-3(b)(1), unless the authorization is terminated  or revoked sooner.       Influenza A by PCR NEGATIVE NEGATIVE Final   Influenza B by PCR NEGATIVE NEGATIVE Final    Comment: (NOTE) The Xpert Xpress SARS-CoV-2/FLU/RSV plus assay is intended as an aid in the diagnosis of influenza from Nasopharyngeal swab specimens and should not be used as a sole basis for treatment. Nasal washings and aspirates are unacceptable for Xpert Xpress SARS-CoV-2/FLU/RSV testing.  Fact Sheet for Patients: BloggerCourse.com  Fact Sheet for Healthcare Providers: SeriousBroker.it  This test is not yet approved or cleared by the [place]  FDA and has been authorized for detection and/or diagnosis of SARS-CoV-2 by FDA under an Emergency Use Authorization (EUA). This EUA will remain in effect (meaning this test can be used) for the duration of the COVID-19 declaration under Section 564(b)(1) of the Act, 21 U.S.C. section 360bbb-3(b)(1), unless the authorization is terminated or revoked.     Resp Syncytial Virus by PCR NEGATIVE NEGATIVE Final    Comment: (NOTE) Fact Sheet for Patients: BloggerCourse.com  Fact Sheet for Healthcare Providers: SeriousBroker.it  This test is not yet approved or cleared by the [place]  FDA and has been authorized for detection and/or diagnosis of SARS-CoV-2 by FDA under an Emergency Use Authorization (EUA). This EUA will remain in effect (meaning this test can be used) for  the duration of the COVID-19 declaration under Section 564(b)(1) of the Act, 21 U.S.C. section 360bbb-3(b)(1), unless the authorization is terminated or revoked.  Performed at [facility], [address], [place], [place] [address]   Culture, blood (routine x 2)     Status: Abnormal   Collection Time: 06/21/24  4:49 PM   Specimen: BLOOD RIGHT FOREARM  Result Value Ref Range Status   Specimen Description BLOOD RIGHT FOREARM  Final   Special Requests   Final  BOTTLES DRAWN AEROBIC AND ANAEROBIC Blood Culture adequate volume   Culture  Setup Time   Final    AEROBIC BOTTLE ONLY GRAM NEGATIVE RODS CRITICAL RESULT CALLED TO, READ BACK BY AND VERIFIED WITH: MAYA RANDALL CALL [phone] AT 0955 BY EC Performed at [facility], [address] [place], [place] [address]    Culture ESCHERICHIA COLI (A)  Final   Report Status 06/24/2024 FINAL  Final   Organism ID, Bacteria ESCHERICHIA COLI  Final   Organism ID, Bacteria ESCHERICHIA COLI  Final      Susceptibility   Escherichia coli - KIRBY BAUER*    CEFAZOLIN  SENSITIVE Sensitive    Escherichia coli - MIC*    AMPICILLIN 8 SENSITIVE Sensitive     CEFEPIME <=0.12 SENSITIVE Sensitive     CEFTAZIDIME <=1 SENSITIVE Sensitive     CEFTRIAXONE  <=0.25 SENSITIVE Sensitive     CIPROFLOXACIN <=0.25 SENSITIVE Sensitive     GENTAMICIN <=1 SENSITIVE Sensitive     IMIPENEM <=0.25 SENSITIVE Sensitive     TRIMETH/SULFA <=20 SENSITIVE Sensitive     AMPICILLIN/SULBACTAM 4 SENSITIVE Sensitive     PIP/TAZO <=4 SENSITIVE Sensitive ug/mL    * ESCHERICHIA COLI    ESCHERICHIA COLI  Blood Culture ID Panel (Reflexed)     Status: Abnormal   Collection Time: 06/21/24  4:49 PM  Result Value Ref Range Status   Enterococcus faecalis NOT DETECTED NOT DETECTED Final   Enterococcus Faecium NOT DETECTED NOT DETECTED Final   Listeria monocytogenes NOT DETECTED NOT DETECTED Final   Staphylococcus species NOT DETECTED NOT DETECTED Final    Staphylococcus aureus (BCID) NOT DETECTED NOT DETECTED Final   Staphylococcus epidermidis NOT DETECTED NOT DETECTED Final   Staphylococcus lugdunensis NOT DETECTED NOT DETECTED Final   Streptococcus species NOT DETECTED NOT DETECTED Final   Streptococcus agalactiae NOT DETECTED NOT DETECTED Final   Streptococcus pneumoniae NOT DETECTED NOT DETECTED Final   Streptococcus pyogenes NOT DETECTED NOT DETECTED Final   A.calcoaceticus-baumannii NOT DETECTED NOT DETECTED Final   Bacteroides fragilis NOT DETECTED NOT DETECTED Final   Enterobacterales DETECTED (A) NOT DETECTED Final    Comment: Enterobacterales represent a large order of gram negative bacteria, not a single organism. CRITICAL RESULT CALLED TO, READ BACK BY AND VERIFIED WITH: PHARMD JENNY ZHOU [phone] AT 0955 BY EC    Enterobacter cloacae complex NOT DETECTED NOT DETECTED Final   Escherichia coli DETECTED (A) NOT DETECTED Final    Comment: CRITICAL RESULT CALLED TO, READ BACK BY AND VERIFIED WITH: PHARMD JENNY ZHOU [phone] AT 0955 BY EC    Klebsiella aerogenes NOT DETECTED NOT DETECTED Final   Klebsiella oxytoca NOT DETECTED NOT DETECTED Final   Klebsiella pneumoniae NOT DETECTED NOT DETECTED Final   Proteus species NOT DETECTED NOT DETECTED Final   Salmonella species NOT DETECTED NOT DETECTED Final   Serratia marcescens NOT DETECTED NOT DETECTED Final   Haemophilus influenzae NOT DETECTED NOT DETECTED Final   Neisseria meningitidis NOT DETECTED NOT DETECTED Final   Pseudomonas aeruginosa NOT DETECTED NOT DETECTED Final   Stenotrophomonas maltophilia NOT DETECTED NOT DETECTED Final   Candida albicans NOT DETECTED NOT DETECTED Final   Candida auris NOT DETECTED NOT DETECTED Final   Candida glabrata NOT DETECTED NOT DETECTED Final   Candida krusei NOT DETECTED NOT DETECTED Final   Candida parapsilosis NOT DETECTED NOT DETECTED Final   Candida tropicalis NOT DETECTED NOT DETECTED Final   Cryptococcus neoformans/gattii NOT  DETECTED NOT DETECTED Final   CTX-M ESBL NOT DETECTED  NOT DETECTED Final   Carbapenem resistance IMP NOT DETECTED NOT DETECTED Final   Carbapenem resistance KPC NOT DETECTED NOT DETECTED Final   Carbapenem resistance NDM NOT DETECTED NOT DETECTED Final   Carbapenem resist OXA 48 LIKE NOT DETECTED NOT DETECTED Final   Carbapenem resistance VIM NOT DETECTED NOT DETECTED Final    Comment: Performed at [facility], [address], [place], [place] [address]  Culture, blood (routine x 2)     Status: None (Preliminary result)   Collection Time: 06/21/24  4:54 PM   Specimen: BLOOD  Result Value Ref Range Status   Specimen Description BLOOD RIGHT ANTECUBITAL  Final   Special Requests   Final    BOTTLES DRAWN AEROBIC AND ANAEROBIC Blood Culture results may not be optimal due to an inadequate volume of blood received in culture bottles   Culture   Final    NO GROWTH 3 DAYS Performed at [facility], [address], [place], [place] [address]    Report Status PENDING  Incomplete      Radiology Studies: DG CHEST PORT 1 VIEW Result Date: 06/23/2024 CLINICAL DATA:  Cough EXAM: PORTABLE CHEST 1 VIEW COMPARISON:  Chest radiograph February 18, 2024 FINDINGS: The heart size and mediastinal contours are within normal limits. Increased interstitial markings of both lung fields without significant consolidation or suspicious pulmonary nodule, increased to prior. Atherosclerotic calcifications of aortic knob and descending aorta. Chronic left rib fractures with mild displacement. No pleural effusion or pneumothorax. IMPRESSION: Diffuse increased interstitial markings of both lung fields without focal consolidation or suspicious pulmonary nodules which can be seen the setting of infection/inflammation, or early pulmonary edema. Correlate with clinical findings. . Electronically Signed   By: Megan  Zare M.D.   On: 06/23/2024 17:15   MR 3D Recon At Scanner Result Date: 06/22/2024 CLINICAL DATA:  Elevated  liver function tests. EXAM: MRI ABDOMEN WITHOUT AND WITH CONTRAST (INCLUDING MRCP) TECHNIQUE: Multiplanar multisequence MR imaging of the abdomen was performed both before and after the administration of intravenous contrast. Heavily T2-weighted images of the biliary and pancreatic ducts were obtained, and three-dimensional MRCP images were rendered by post processing. CONTRAST:  7mL GADAVIST  GADOBUTROL  1 MMOL/ML IV SOLN COMPARISON:  CT 06/21/2024 FINDINGS: Exam is degraded patient respiratory motion. Patient has difficulty falling breath hold commands. Lower chest:  Lung bases are clear. Hepatobiliary: No intrahepatic biliary duct dilatation. No hepatic steatosis. Normal liver parenchyma. Within the lateral segment LEFT hepatic lobe 13 mm lesion is hyperintense on T2 weighted imaging corresponds to lesion of concern on comparison CT. There is motion degradation of the postcontrast T1 weighted imaging however lesion appears to have peripheral enhancement which is typical of benign hemangiomas. MRCP sequences are degraded by patient respiratory motion. No gallstones are evident. No biliary duct dilatation evident. No choledocholithiasis. Common bile duct measures 5 mm (image 21/series 3). Pancreas: Normal pancreatic parenchymal intensity. No ductal dilatation or inflammation. Spleen: Normal spleen. Adrenals/urinary tract: Adrenal glands and kidneys are normal. Stomach/Bowel: Stomach and limited of the small bowel is unremarkable Vascular/Lymphatic: Abdominal aortic normal caliber. No retroperitoneal periportal lymphadenopathy. Musculoskeletal: No aggressive osseous lesion IMPRESSION: 1. Certain sequences are severely degraded by patient respiratory motion. 2. No evidence acute cholecystitis. 3. No choledocholithiasis. 4. Normal common hepatic duct and common bile duct. No evidence of biliary obstruction. 5. No hepatic steatosis. 6. Probable benign hemangioma in the RIGHT hepatic lobe. Electronically Signed   By:  Jackquline Boxer M.D.   On: 06/22/2024 16:06  MR ABDOMEN MRCP W WO CONTAST Result Date: 06/22/2024 CLINICAL DATA:  Elevated liver function tests. EXAM: MRI ABDOMEN WITHOUT AND WITH CONTRAST (INCLUDING MRCP) TECHNIQUE: Multiplanar multisequence MR imaging of the abdomen was performed both before and after the administration of intravenous contrast. Heavily T2-weighted images of the biliary and pancreatic ducts were obtained, and three-dimensional MRCP images were rendered by post processing. CONTRAST:  7mL GADAVIST  GADOBUTROL  1 MMOL/ML IV SOLN COMPARISON:  CT 06/21/2024 FINDINGS: Exam is degraded patient respiratory motion. Patient has difficulty falling breath hold commands. Lower chest:  Lung bases are clear. Hepatobiliary: No intrahepatic biliary duct dilatation. No hepatic steatosis. Normal liver parenchyma. Within the lateral segment LEFT hepatic lobe 13 mm lesion is hyperintense on T2 weighted imaging corresponds to lesion of concern on comparison CT. There is motion degradation of the postcontrast T1 weighted imaging however lesion appears to have peripheral enhancement which is typical of benign hemangiomas. MRCP sequences are degraded by patient respiratory motion. No gallstones are evident. No biliary duct dilatation evident. No choledocholithiasis. Common bile duct measures 5 mm (image 21/series 3). Pancreas: Normal pancreatic parenchymal intensity. No ductal dilatation or inflammation. Spleen: Normal spleen. Adrenals/urinary tract: Adrenal glands and kidneys are normal. Stomach/Bowel: Stomach and limited of the small bowel is unremarkable Vascular/Lymphatic: Abdominal aortic normal caliber. No retroperitoneal periportal lymphadenopathy. Musculoskeletal: No aggressive osseous lesion IMPRESSION: 1. Certain sequences are severely degraded by patient respiratory motion. 2. No evidence acute cholecystitis. 3. No choledocholithiasis. 4. Normal common hepatic duct and common bile duct. No evidence of biliary  obstruction. 5. No hepatic steatosis. 6. Probable benign hemangioma in the RIGHT hepatic lobe. Electronically Signed   By: Jackquline Boxer M.D.   On: 06/22/2024 16:06       LOS: 3 days   Earley Grobe Verdene  [facility] Pager on [email or web address]  06/24/2024, 9:53 AM

## 2024-06-24 NOTE — Progress Notes (Signed)
 Occupational Therapy Treatment Patient Details Name: [name] MRN: [id number] DOB: June 30, 1932 Today's Date: 06/24/2024   History of present illness Pt is an 89 y.o. female presenting 7/5 with RUQ abdominal pain radiating to back, nausea. Found to have suspected cholecystitis, hyponatremia, bacteriuria, hematuria. [facility] ED visit 7/3 Lab work demonstrates acute liver injury but pt leaving without being seen. PMH: COPD, PAD, HLD, OSA, breast cancer.   OT comments  Pt progressing toward established OT goals. Challenging activity tolerance this session within what pt is agreeable to do. Pt performing functional transfers and short distance ambulation in room and to restroom with RW at [facility] supervision level as well as pericare seated on BSC over toilet in restroom. Pt with good progression. Encouraged pt to get OOB more frequently for trips to restroom with nursing staff and ask to sit in chair for meals. Continue to recommend HHOT at discharge.       If plan is discharge home, recommend the following:  A little help with walking and/or transfers;A little help with bathing/dressing/bathroom;Assistance with cooking/housework;Assist for transportation;Help with stairs or ramp for entrance   Equipment Recommendations  BSC/3in1    Recommendations for Other Services      Precautions / Restrictions Precautions Precautions: Fall Restrictions Weight Bearing Restrictions Per Provider Order: No       Mobility Bed Mobility Overal bed mobility: Needs Assistance Bed Mobility: Sit to Supine, Supine to Sit     Supine to sit: Supervision          Transfers Overall transfer level: Needs assistance Equipment used: Rolling walker (2 wheels) Transfers: Sit to/from Stand Sit to Stand: Contact guard assist, Min assist           General transfer comment: min A for first attempt, CGA for second attempt. Pt with tendency to attempt pulling up on RW but able to be redirected     Balance  Overall balance assessment: Needs assistance Sitting-balance support: No upper extremity supported, Feet supported Sitting balance-Leahy Scale: Good     Standing balance support: Bilateral upper extremity supported, Single extremity supported, During functional activity Standing balance-Leahy Scale: Poor Standing balance comment: poor to fair, benefits from RW, able to stand with unilateral UE support to perform pericare after tolieting                           ADL either performed or assessed with clinical judgement   ADL Overall ADL's : Needs assistance/impaired                         Toilet Transfer: Contact guard assist;Ambulation;Rolling walker (2 wheels);BSC/3in1   Toileting- Clothing Manipulation and Hygiene: Contact guard assist;Sitting/lateral lean;Sit to/from stand       Functional mobility during ADLs: Contact guard assist;Rolling walker (2 wheels)      Extremity/Trunk Assessment Upper Extremity Assessment Upper Extremity Assessment: Right hand dominant;Generalized weakness   Lower Extremity Assessment Lower Extremity Assessment: Generalized weakness        Vision   Vision Assessment?: No apparent visual deficits   Perception     Praxis     Communication Communication Communication: No apparent difficulties   Cognition Arousal: Alert Behavior During Therapy: WFL for tasks assessed/performed Cognition: No apparent impairments             OT - Cognition Comments: very motivated to be able to care for self  Following commands: Intact        Cueing   Cueing Techniques: Verbal cues  Exercises      Shoulder Instructions       General Comments lengthy discussion with family regarding recommendations for follow up. pt family reporting they can not care for her at home. Pt moving well. Family interested in aide, reached out to case manager who reports unlikely to get hours approved by insurance. Defer  further conversation regaridng this to [facility]    Pertinent Vitals/ Pain       Pain Assessment Pain Assessment: Faces Faces Pain Scale: Hurts a little bit Pain Location: generalized Pain Descriptors / Indicators: Sore Pain Intervention(s): Limited activity within patient's tolerance, Monitored during session  Home Living                                          Prior Functioning/Environment              Frequency  Min 2X/week        Progress Toward Goals  OT Goals(current goals can now be found in the care plan section)  Progress towards OT goals: Progressing toward goals  Acute Rehab OT Goals Patient Stated Goal: get better to take care of myself OT Goal Formulation: With patient Time For Goal Achievement: 07/06/24 Potential to Achieve Goals: Good ADL Goals Pt Will Perform Grooming: with supervision;standing Pt Will Perform Lower Body Dressing: sit to/from stand;with supervision Pt Will Transfer to Toilet: with supervision;ambulating  Plan      Co-evaluation                 AM-PAC OT 6 Clicks Daily Activity     Outcome Measure   Help from another person eating meals?: None Help from another person taking care of personal grooming?: A Little Help from another person toileting, which includes using toliet, bedpan, or urinal?: A Little Help from another person bathing (including washing, rinsing, drying)?: A Little Help from another person to put on and taking off regular upper body clothing?: A Little Help from another person to put on and taking off regular lower body clothing?: A Little 6 Click Score: 19    End of Session Equipment Utilized During Treatment: Rolling walker (2 wheels)  OT Visit Diagnosis: Unsteadiness on feet (R26.81);Muscle weakness (generalized) (M62.81)   Activity Tolerance Patient tolerated treatment well   Patient Left in bed;with call bell/phone within reach;with family/visitor present   Nurse Communication  Mobility status        Time: 8449-8378 OT Time Calculation (min): 31 min  Charges: OT General Charges $OT Visit: 1 Visit OT Treatments $Self Care/Home Management : 23-37 mins  Elma JONETTA Lebron FREDERICK, OTR/L [facility] Acute Rehabilitation Office: [phone]   Elma JONETTA Lebron 06/24/2024, 5:28 PM

## 2024-06-25 ENCOUNTER — Encounter (HOSPITAL_COMMUNITY)
Admission: EM | Disposition: A | Discharge status: Rehabilitation Facility | Payer: Self-pay | Source: Home / Self Care | Attending: Neurology

## 2024-06-25 ENCOUNTER — Inpatient Hospital Stay (HOSPITAL_COMMUNITY)

## 2024-06-25 DIAGNOSIS — A419 Sepsis, unspecified organism: Secondary | ICD-10-CM

## 2024-06-25 DIAGNOSIS — J449 Chronic obstructive pulmonary disease, unspecified: Secondary | ICD-10-CM

## 2024-06-25 DIAGNOSIS — Z87891 Personal history of nicotine dependence: Secondary | ICD-10-CM | POA: Diagnosis not present

## 2024-06-25 DIAGNOSIS — I639 Cerebral infarction, unspecified: Secondary | ICD-10-CM

## 2024-06-25 DIAGNOSIS — B9620 Unspecified Escherichia coli [E. coli] as the cause of diseases classified elsewhere: Secondary | ICD-10-CM

## 2024-06-25 DIAGNOSIS — K819 Cholecystitis, unspecified: Secondary | ICD-10-CM

## 2024-06-25 DIAGNOSIS — I739 Peripheral vascular disease, unspecified: Secondary | ICD-10-CM | POA: Diagnosis not present

## 2024-06-25 DIAGNOSIS — I5031 Acute diastolic (congestive) heart failure: Secondary | ICD-10-CM | POA: Diagnosis not present

## 2024-06-25 DIAGNOSIS — R7881 Bacteremia: Secondary | ICD-10-CM | POA: Diagnosis not present

## 2024-06-25 DIAGNOSIS — E785 Hyperlipidemia, unspecified: Secondary | ICD-10-CM

## 2024-06-25 DIAGNOSIS — R7989 Other specified abnormal findings of blood chemistry: Secondary | ICD-10-CM | POA: Diagnosis not present

## 2024-06-25 DIAGNOSIS — I6601 Occlusion and stenosis of right middle cerebral artery: Secondary | ICD-10-CM

## 2024-06-25 DIAGNOSIS — I10 Essential (primary) hypertension: Secondary | ICD-10-CM | POA: Diagnosis not present

## 2024-06-25 DIAGNOSIS — E871 Hypo-osmolality and hyponatremia: Secondary | ICD-10-CM

## 2024-06-25 DIAGNOSIS — G4733 Obstructive sleep apnea (adult) (pediatric): Secondary | ICD-10-CM | POA: Diagnosis not present

## 2024-06-25 DIAGNOSIS — R8271 Bacteriuria: Secondary | ICD-10-CM | POA: Diagnosis not present

## 2024-06-25 HISTORY — PX: RADIOLOGY WITH ANESTHESIA: SHX6223

## 2024-06-25 HISTORY — PX: IR CT HEAD LTD: IMG2386

## 2024-06-25 HISTORY — PX: IR PERCUTANEOUS ART THROMBECTOMY/INFUSION INTRACRANIAL INC DIAG ANGIO: IMG6087

## 2024-06-25 LAB — COMPREHENSIVE METABOLIC PANEL WITH GFR
ALT: 70 U/L — ABNORMAL HIGH (ref 0–44)
AST: 17 U/L (ref 15–41)
Albumin: 2.6 g/dL — ABNORMAL LOW (ref 3.5–5.0)
Alkaline Phosphatase: 66 U/L (ref 38–126)
Anion gap: 7 (ref 5–15)
BUN: 7 mg/dL — ABNORMAL LOW (ref 8–23)
CO2: 25 mmol/L (ref 22–32)
Calcium: 7.8 mg/dL — ABNORMAL LOW (ref 8.9–10.3)
Chloride: 103 mmol/L (ref 98–111)
Creatinine, Ser: 0.74 mg/dL (ref 0.44–1.00)
GFR, Estimated: >60 mL/min (ref >60)
Glucose, Bld: 120 mg/dL — ABNORMAL HIGH (ref 70–99)
Potassium: 4 mmol/L (ref 3.5–5.1)
Sodium: 135 mmol/L (ref 135–145)
Total Bilirubin: 0.6 mg/dL (ref 0.0–1.2)
Total Protein: 5.4 g/dL — ABNORMAL LOW (ref 6.5–8.1)

## 2024-06-25 LAB — CBC
HCT: 33.6 % — ABNORMAL LOW (ref 36.0–46.0)
Hemoglobin: 11.2 g/dL — ABNORMAL LOW (ref 12.0–15.0)
MCH: 31.2 pg (ref 26.0–34.0)
MCHC: 33.3 g/dL (ref 30.0–36.0)
MCV: 93.6 fL (ref 80.0–100.0)
Platelets: 143 K/uL — ABNORMAL LOW (ref 150–400)
RBC: 3.59 MIL/uL — ABNORMAL LOW (ref 3.87–5.11)
RDW: 13.3 % (ref 11.5–15.5)
WBC: 7.7 K/uL (ref 4.0–10.5)
nRBC: 0 % (ref 0.0–0.2)

## 2024-06-25 LAB — ECHOCARDIOGRAM COMPLETE
AR max vel: 2.63 cm2
AV Peak grad: 3.2 mmHg
Ao pk vel: 0.89 m/s
Area-P 1/2: 3.37 cm2
Height: 61 in
S' Lateral: 2.4 cm
Weight: 2395.08 [oz_av]

## 2024-06-25 LAB — MAGNESIUM: Magnesium: 1.7 mg/dL (ref 1.7–2.4)

## 2024-06-25 LAB — GLUCOSE, CAPILLARY: Glucose-Capillary: 103 mg/dL — ABNORMAL HIGH (ref 70–99)

## 2024-06-25 LAB — SARS CORONAVIRUS 2 BY RT PCR: SARS Coronavirus 2 by RT PCR: NEGATIVE

## 2024-06-25 SURGERY — RADIOLOGY WITH ANESTHESIA
Anesthesia: General

## 2024-06-25 MED ORDER — LABETALOL HCL 5 MG/ML IV SOLN
INTRAVENOUS | Status: DC | PRN
  Administered 2024-06-25: 5 mg via INTRAVENOUS

## 2024-06-25 MED ORDER — DOCUSATE SODIUM 50 MG/5ML PO LIQD: 100.0000 mg | Freq: Two times a day (BID) | ORAL | Status: DC

## 2024-06-25 MED ORDER — FENTANYL BOLUS VIA INFUSION
25.0000 ug | INTRAVENOUS | Status: DC | PRN
  Administered 2024-06-26: 50 ug via INTRAVENOUS

## 2024-06-25 MED ORDER — ACETAMINOPHEN 325 MG PO TABS
650.0000 mg | ORAL_TABLET | ORAL | Status: DC | PRN
  Administered 2024-07-02 – 2024-07-05 (×2): 650 mg via ORAL
  Filled 2024-06-25 (×3): qty 2

## 2024-06-25 MED ORDER — POLYETHYLENE GLYCOL 3350 17 G PO PACK: 17.0000 g | PACK | Freq: Every day | ORAL | Status: DC

## 2024-06-25 MED ORDER — ROCURONIUM BROMIDE 10 MG/ML (PF) SYRINGE
PREFILLED_SYRINGE | INTRAVENOUS | Status: DC | PRN
  Administered 2024-06-25: 20 mg via INTRAVENOUS
  Administered 2024-06-25: 60 mg via INTRAVENOUS
  Administered 2024-06-25: 20 mg via INTRAVENOUS

## 2024-06-25 MED ORDER — ORAL CARE MOUTH RINSE: 15.0000 mL | OROMUCOSAL | Status: DC | PRN

## 2024-06-25 MED ORDER — NITROGLYCERIN 1 MG/10 ML FOR IR/CATH LAB
INTRA_ARTERIAL | Status: AC
  Filled 2024-06-25: qty 10

## 2024-06-25 MED ORDER — ACETAMINOPHEN 160 MG/5ML PO SOLN: 650.0000 mg | ORAL | Status: DC | PRN

## 2024-06-25 MED ORDER — IOHEXOL 300 MG/ML  SOLN
100.0000 mL | Freq: Once | INTRAMUSCULAR | Status: AC | PRN
  Administered 2024-06-25: 20 mL via INTRA_ARTERIAL

## 2024-06-25 MED ORDER — ACETAMINOPHEN 160 MG/5ML PO SOLN
650.0000 mg | ORAL | Status: DC | PRN
  Administered 2024-06-27 – 2024-07-06 (×8): 650 mg
  Filled 2024-06-25 (×9): qty 20.3

## 2024-06-25 MED ORDER — PHENYLEPHRINE 80 MCG/ML (10ML) SYRINGE FOR IV PUSH (FOR BLOOD PRESSURE SUPPORT)
PREFILLED_SYRINGE | INTRAVENOUS | Status: DC | PRN
  Administered 2024-06-25: 80 ug via INTRAVENOUS
  Administered 2024-06-25 (×2): 160 ug via INTRAVENOUS

## 2024-06-25 MED ORDER — FAMOTIDINE 20 MG PO TABS: 20.0000 mg | ORAL_TABLET | Freq: Two times a day (BID) | ORAL | Status: DC

## 2024-06-25 MED ORDER — ORAL CARE MOUTH RINSE
15.0000 mL | OROMUCOSAL | Status: DC
  Administered 2024-06-25 – 2024-06-26 (×8): 15 mL via OROMUCOSAL

## 2024-06-25 MED ORDER — CLEVIDIPINE BUTYRATE 0.5 MG/ML IV EMUL
0.0000 mg/h | INTRAVENOUS | Status: AC
  Administered 2024-06-26: 2 mg/h via INTRAVENOUS
  Filled 2024-06-25: qty 50

## 2024-06-25 MED ORDER — FENTANYL 2500MCG IN NS 250ML (10MCG/ML) PREMIX INFUSION
0.0000 ug/h | INTRAVENOUS | Status: DC
  Administered 2024-06-25: 50 ug/h via INTRAVENOUS
  Filled 2024-06-25: qty 250

## 2024-06-25 MED ORDER — FUROSEMIDE 10 MG/ML IJ SOLN
INTRAMUSCULAR | Status: AC
  Administered 2024-06-25: 40 mg via INTRAVENOUS
  Filled 2024-06-25: qty 4

## 2024-06-25 MED ORDER — STROKE: EARLY STAGES OF RECOVERY BOOK
Freq: Once | Status: AC
  Filled 2024-06-25: qty 1

## 2024-06-25 MED ORDER — LACTATED RINGERS IV SOLN: INTRAVENOUS | Status: DC | PRN

## 2024-06-25 MED ORDER — SODIUM CHLORIDE 0.9 % IV SOLN: INTRAVENOUS | Status: DC

## 2024-06-25 MED ORDER — SUCCINYLCHOLINE CHLORIDE 200 MG/10ML IV SOSY
PREFILLED_SYRINGE | INTRAVENOUS | Status: DC | PRN
  Administered 2024-06-25: 100 mg via INTRAVENOUS

## 2024-06-25 MED ORDER — PROPOFOL 1000 MG/100ML IV EMUL
0.0000 ug/kg/min | INTRAVENOUS | Status: DC
  Administered 2024-06-25: 5 ug/kg/min via INTRAVENOUS
  Administered 2024-06-26: 25 ug/kg/min via INTRAVENOUS
  Filled 2024-06-25 (×2): qty 100

## 2024-06-25 MED ORDER — FUROSEMIDE 10 MG/ML IJ SOLN
40.0000 mg | Freq: Once | INTRAMUSCULAR | Status: AC
  Filled 2024-06-25: qty 4

## 2024-06-25 MED ORDER — MIDAZOLAM HCL 2 MG/2ML IJ SOLN: 1.0000 mg | INTRAMUSCULAR | Status: DC | PRN

## 2024-06-25 MED ORDER — NITROGLYCERIN 0.4 MG SL SUBL: 0.4000 mg | SUBLINGUAL_TABLET | SUBLINGUAL | Status: DC | PRN

## 2024-06-25 MED ORDER — ACETAMINOPHEN 650 MG RE SUPP: 650.0000 mg | RECTAL | Status: DC | PRN

## 2024-06-25 MED ORDER — NITROGLYCERIN 1 MG/10 ML FOR IR/CATH LAB
INTRA_ARTERIAL | Status: AC | PRN
  Administered 2024-06-25 (×2): 25 ug via INTRA_ARTERIAL

## 2024-06-25 MED ORDER — IOHEXOL 350 MG/ML SOLN
75.0000 mL | Freq: Once | INTRAVENOUS | Status: AC | PRN
  Administered 2024-06-25: 75 mL via INTRAVENOUS

## 2024-06-25 MED ORDER — PANTOPRAZOLE SODIUM 40 MG IV SOLR
40.0000 mg | Freq: Every day | INTRAVENOUS | Status: DC
  Administered 2024-06-25 – 2024-06-26 (×2): 40 mg via INTRAVENOUS
  Filled 2024-06-25 (×2): qty 10

## 2024-06-25 MED ORDER — DEXAMETHASONE SODIUM PHOSPHATE 10 MG/ML IJ SOLN
INTRAMUSCULAR | Status: DC | PRN
  Administered 2024-06-25: 4 mg via INTRAVENOUS

## 2024-06-25 MED ORDER — PROPOFOL 10 MG/ML IV BOLUS
INTRAVENOUS | Status: DC | PRN
  Administered 2024-06-25: 30 mg via INTRAVENOUS
  Administered 2024-06-25: 150 mg via INTRAVENOUS
  Administered 2024-06-25: 20 mg via INTRAVENOUS

## 2024-06-25 MED ORDER — PROPOFOL 500 MG/50ML IV EMUL
INTRAVENOUS | Status: DC | PRN
  Administered 2024-06-25: 70 ug/kg/min via INTRAVENOUS

## 2024-06-25 MED ORDER — FENTANYL CITRATE PF 50 MCG/ML IJ SOSY
25.0000 ug | PREFILLED_SYRINGE | Freq: Once | INTRAMUSCULAR | Status: AC
  Administered 2024-06-25: 50 ug via INTRAVENOUS

## 2024-06-25 MED ORDER — PHENYLEPHRINE HCL-NACL 20-0.9 MG/250ML-% IV SOLN
INTRAVENOUS | Status: DC | PRN
  Administered 2024-06-25: 30 ug/min via INTRAVENOUS

## 2024-06-25 MED ORDER — ACETAMINOPHEN 325 MG PO TABS: 650.0000 mg | ORAL_TABLET | ORAL | Status: DC | PRN

## 2024-06-25 MED ORDER — IOHEXOL 300 MG/ML  SOLN
150.0000 mL | Freq: Once | INTRAMUSCULAR | Status: AC | PRN
  Administered 2024-06-25: 110 mL via INTRA_ARTERIAL

## 2024-06-25 MED ORDER — TENECTEPLASE FOR STROKE
0.2500 mg/kg | PACK | Freq: Once | INTRAVENOUS | Status: AC
  Administered 2024-06-25: 17 mg via INTRAVENOUS
  Filled 2024-06-25: qty 10

## 2024-06-25 MED ORDER — PANTOPRAZOLE SODIUM 40 MG IV SOLR: 40.0000 mg | Freq: Every day | INTRAVENOUS | Status: DC

## 2024-06-25 NOTE — Progress Notes (Signed)
 PHARMACIST CODE STROKE RESPONSE  Notified to mix TNK at 1849 by Dr. Jerrie TNK preparation completed at 1851  TNK dose = 17 mg IV over 5 seconds  Issues/delays encountered (if applicable): none  Dorn LITTIE Buttner 06/25/24 7:15 PM

## 2024-06-25 NOTE — Progress Notes (Signed)
 Occupational Therapy Treatment Patient Details Name: [name] MRN: [id number] DOB: September 29, 1932 Today's Date: 06/25/2024   History of present illness Pt is an 89 y.o. female presenting 7/5 with RUQ abdominal pain radiating to back, nausea. Found to have suspected cholecystitis, hyponatremia, bacteriuria, hematuria. [facility] visit 7/3 Lab work demonstrates acute liver injury but pt leaving without being seen. PMH: COPD, PAD, HLD, OSA, breast cancer.   OT comments  Patient making good gains with OT treatment. Patient able to get to EOB with increased time and assistance with BLEs. Patient stood from EOB with min assist and cues for hand placement and CGA to ambulate to sink for grooming tasks with patient requiring seated rest break following. Patient's breakfast arrived during treatment and patient was provided setup with assistance to open containers. Discharge recommendations continue to be appropriate. Acute OT to continue to follow to address established goals.       If plan is discharge home, recommend the following:  A little help with walking and/or transfers;A little help with bathing/dressing/bathroom;Assistance with cooking/housework;Assist for transportation;Help with stairs or ramp for entrance   Equipment Recommendations  BSC/3in1    Recommendations for Other Services      Precautions / Restrictions Precautions Precautions: Fall Restrictions Weight Bearing Restrictions Per Provider Order: No       Mobility Bed Mobility Overal bed mobility: Needs Assistance Bed Mobility: Supine to Sit     Supine to sit: Min assist     General bed mobility comments: increased time and min assist with LE off to EOB    Transfers Overall transfer level: Needs assistance Equipment used: Rolling walker (2 wheels) Transfers: Sit to/from Stand, Bed to chair/wheelchair/BSC Sit to Stand: Contact guard assist, Min assist     Step pivot transfers: Contact guard assist     General  transfer comment: min to CGA to power up from EOB and CGA for transfes     Balance Overall balance assessment: Needs assistance Sitting-balance support: No upper extremity supported, Feet supported Sitting balance-Leahy Scale: Good     Standing balance support: Single extremity supported, Bilateral upper extremity supported, During functional activity Standing balance-Leahy Scale: Poor Standing balance comment: stood at sink for grooming tasks with one extremity support                           ADL either performed or assessed with clinical judgement   ADL Overall ADL's : Needs assistance/impaired Eating/Feeding: Set up;Sitting Eating/Feeding Details (indicate cue type and reason): assistance with opening containers Grooming: Wash/dry hands;Wash/dry face;Contact guard assist;Standing Grooming Details (indicate cue type and reason): at sink         Upper Body Dressing : Set up;Sitting Upper Body Dressing Details (indicate cue type and reason): donned gown to cover back Lower Body Dressing: Moderate assistance;Sitting/lateral leans Lower Body Dressing Details (indicate cue type and reason): patient able to doff socks with mod assist to donn s Toilet Transfer: Contact guard assist;Ambulation;Rolling walker (2 wheels);BSC/3in1 Toilet Transfer Details (indicate cue type and reason): simulated         Functional mobility during ADLs: Contact guard assist;Rolling walker (2 wheels)      Extremity/Trunk Assessment              Vision       Perception     Praxis     Communication Communication Communication: No apparent difficulties   Cognition Arousal: Alert Behavior During Therapy: WFL for tasks assessed/performed Cognition: No  apparent impairments                               Following commands: Intact        Cueing   Cueing Techniques: Verbal cues  Exercises      Shoulder Instructions       General Comments Patient's  daughter present during session    Pertinent Vitals/ Pain       Pain Assessment Pain Assessment: Faces Faces Pain Scale: Hurts a little bit Pain Location: generalized with mobility Pain Descriptors / Indicators: Grimacing Pain Intervention(s): Monitored during session, Repositioned  Home Living                                          Prior Functioning/Environment              Frequency  Min 2X/week        Progress Toward Goals  OT Goals(current goals can now be found in the care plan section)  Progress towards OT goals: Progressing toward goals  Acute Rehab OT Goals Patient Stated Goal: to get stronger OT Goal Formulation: With patient Time For Goal Achievement: 07/06/24 Potential to Achieve Goals: Good ADL Goals Pt Will Perform Grooming: with supervision;standing Pt Will Perform Lower Body Dressing: sit to/from stand;with supervision Pt Will Transfer to Toilet: with supervision;ambulating  Plan      Co-evaluation                 AM-PAC OT 6 Clicks Daily Activity     Outcome Measure   Help from another person eating meals?: None Help from another person taking care of personal grooming?: A Little Help from another person toileting, which includes using toliet, bedpan, or urinal?: A Little Help from another person bathing (including washing, rinsing, drying)?: A Little Help from another person to put on and taking off regular upper body clothing?: A Little Help from another person to put on and taking off regular lower body clothing?: A Little 6 Click Score: 19    End of Session Equipment Utilized During Treatment: Gait belt;Rolling walker (2 wheels)  OT Visit Diagnosis: Unsteadiness on feet (R26.81);Muscle weakness (generalized) (M62.81)   Activity Tolerance Patient tolerated treatment well   Patient Left in chair;with call bell/phone within reach;with family/visitor present   Nurse Communication Mobility status         Time: 9184-9159 OT Time Calculation (min): 25 min  Charges: OT General Charges $OT Visit: 1 Visit OT Treatments $Self Care/Home Management : 23-37 mins  Dick Laine, OTA Acute Rehabilitation Services  Office [phone]   Jeb LITTIE Laine 06/25/2024, 11:23 AM

## 2024-06-25 NOTE — Anesthesia Preprocedure Evaluation (Addendum)
 Anesthesia Evaluation  Patient identified by MRN, date of birth, ID band Patient confused and Patient unresponsive    Reviewed: Allergy & Precautions, H&P Preop documentation limited or incomplete due to emergent nature of procedure.  Airway Mallampati: Unable to assess  TM Distance: >3 FB Neck ROM: Full    Dental  (+) Dental Advisory Given   Pulmonary sleep apnea , COPD,  COPD inhaler, former smoker 56 pack year history, quit 2017   breath sounds clear to auscultation       Cardiovascular hypertension (154/77 preop),  Rhythm:Regular Rate:Normal  Echo 06/25/2024  1. Left ventricular ejection fraction, by estimation, is 60 to 65%. The left ventricle has normal function. The left ventricle has no regional wall motion abnormalities. Left ventricular diastolic parameters are consistent with Grade II diastolic dysfunction (pseudonormalization).   2. Right ventricular systolic function is normal. The right ventricular size is normal.   3. Left atrial size was mildly dilated.   4. The mitral valve is normal in structure. No evidence of mitral valve regurgitation. No evidence of mitral stenosis.   5. The aortic valve is tricuspid. Aortic valve regurgitation is not visualized. No aortic stenosis is present.   6. The inferior vena cava is normal in size with greater than 50% respiratory variability, suggesting right atrial pressure of 3 mmHg.      Neuro/Psych  PSYCHIATRIC DISORDERS Anxiety Depression    daughter was visiting with her in the hospital today when the patient suddenly had left-sided weakness with right gaze deviation and slurred speech witnessed onset by daughter at 6:15 PM   She initially presented with right upper quadrant abdominal pain, nausea vomiting found to have abnormal LFTs.  There was some concern for possible acalculous cholecystitis and she did have E. coli bacteremia felt to be secondary to gallbladder source which has  been appropriately treated with ceftriaxone  for 5 days now.  Hospital course was also complicated by some encephalopathy with hallucinations which have been improving.  Overnight she did have some difficulty breathing which was attributed to volume overload and she was treated with diuretics  CVA (CODE STROKE)    GI/Hepatic negative GI ROS, Neg liver ROS,,,  Endo/Other  negative endocrine ROS    Renal/GU negative Renal ROS     Musculoskeletal negative musculoskeletal ROS (+)    Abdominal   Peds  Hematology  (+) Blood dyscrasia, anemia Hb 11.2, plt 143   Anesthesia Other Findings   Reproductive/Obstetrics                              Anesthesia Physical Anesthesia Plan  ASA: 4 and emergent  Anesthesia Plan: General   Post-op Pain Management: Minimal or no pain anticipated   Induction: Intravenous and Rapid sequence  PONV Risk Score and Plan: Treatment may vary due to age or medical condition and Ondansetron   Airway Management Planned: Oral ETT  Additional Equipment: Arterial line  Intra-op Plan:   Post-operative Plan: Post-operative intubation/ventilation  Informed Consent:      Only emergency history available and History available from chart only  Plan Discussed with: CRNA  Anesthesia Plan Comments: (Code stroke- pt awake but not responsive to questioning beforehand. Unknown NPO status.)         Anesthesia Quick Evaluation

## 2024-06-25 NOTE — Hospital Course (Signed)
 The patient is an 89 y.o. female with hx of COPD, PAD, hyperlipidemia, OSA, right breast invasive ductal carcinoma stage Ia s/p lumpectomy, radiation, hormone therapy, who presented with right upper quadrant abdominal pain radiating to the back and nausea and vomiting.  Patient was found to have abnormal LFTs.  She was hospitalized for further management.  Further workup reveals an E. coli bacteremia and concern for possible acalculous cholecystitis.  General surgery was consulted and her abdominal pain improved.  Her HIDA scan was done and was negative so surgery felt that her diet could be advanced.  She is being continued on antibiotics with IV ceftriaxone .  On the evening of 06/25/2024 patient had a code stroke called and had a neurological deficit and acute change in her neurological function.  She underwent stat imaging and CT of the head was unremarkable but CT angio of the head and neck showed a right MCA M1 occlusion that is most likely cardioembolic in etiology.  She was given TNK and taken for thrombectomy.  Subsequently she is being admitted to the ICU and transferred to the neurological service.    Assessment and Plan:  Abdominal pain/abnormal LFTs/concern for cholecystitis She was noted to have significantly elevated AST ALT bilirubin.  Bilirubin has been normal for the last 48 hours.  AST and ALT have improved as well. Imaging studies raises concern for cholecystitis.  Right upper quadrant ultrasound was nonspecific.  No gallstones were noted.  CBD was noted to be upper limit of normal.  Hepatic lobe lesion was noted thought to be hemangioma.   Patient was seen by general surgery.  They ordered MRI of the abdomen which was a poor study due to motion.  However no cholecystitis was evident. Patient is still tender in the right upper quadrant.  Blood cultures positive for E. coli.  Clinically she appears to have gallbladder disease but would defer further management to general surgery.  They  have ordered a HIDA scan which is negative. Hepatitis panel is unremarkable. Continue with ceftriaxone  but will discontinue metronidazole  -Surgery has now signed off the case and recommended advancement of diet.  Acute CVA: Had neurological change late this afternoon and a stat head CT was done as a code stroke.  Underwent further workup and CTA of the head neck reveals that she has a M1 occlusion on the right side.  Neurology will be consulted and she is undergoing further stroke workup.  Ordered echocardiogram this morning given her dyspnea last night.  Further care per stroke service and given that she is getting TNK and getting a thrombectomy she will be moved to the ICU care will be assumed by neurological team   Acute encephalopathy with hallucinations Most likely due to hospital stay and Ativan .  No focal neurological deficits on examination.  Continue to monitor.  Reorient frequently. Seems to be better this morning.   E. coli bacteremia Most likely source is gallbladder.  UA was noted to be abnormal however patient denies any urinary symptoms and unfortunately urine culture was never sent. E. coli is pansensitive in the blood. Continue ceftriaxone .   Hyponatremia: Resolved.  Last sodium was 135   Hypokalemia: Supplemented.  Last potassium was 4.0  Abnormal UA: No symptoms of dysuria.  Is on ceftriaxone .  Continue to monitor.     History of COPD: Patient has had coughing episodes the last few days.  Chest x-ray yesterday showed prominent interstitial markings.  Nonspecific finding.  Repeat chest x-ray done and BNP was elevated.  Continue to monitor respiratory status and continue with nebs  Acute on chronic diastolic CHF: Had some dyspnea and crackles noted.  Given IV Lasix  last night with improvement.  Will give another dose of IV Lasix  20 mg today.  Repeating echocardiogram and echocardiogram showed an EF of 60 to 65% with grade 2 diastolic dysfunction which is a change from the  prior grade 1 diastolic function.  Continue monitor for signs and symptoms of volume overload.  Peripheral artery disease: Was holding aspirin  and statin given concern for possible surgical intervention but this now needs to be resumed and given that she had a acute CVA will defer further care to the neurology team for anticoagulation and antiplatelet recommendations.   History of right breast carcinoma: She is status post lumpectomy radiation and hormone treatment and is in remission.   Normocytic anemia/thrombocytopenia: Could be related to acute illness.  Improving. Hgb/Hct and Plt Count Trend: Recent Labs  Lab 06/19/24 1918 06/21/24 1532 06/22/24 0510 06/23/24 0249 06/24/24 0826 06/25/24 0514  HGB 13.8 13.0 11.0* 11.6* 11.1* 11.2*  HCT 41.0 37.3 32.7* 34.7* 32.8* 33.6*  MCV 93.8 93.0 91.6 94.6 91.6 93.6  PLT 122* 102* 92* 115* 121* 143*  - Anemia panel done and showed an iron level of 18, UIBC of 203, TIBC of 221, saturation ratios of 8%, ferritin level 210, folate of 16.0, vitamin B12 941.  Continue to monitor for signs of bleeding; no overt bleeding noted.  Repeat CBC in a.m.  Hypoalbuminemia -Patient's Albumin Trend: Recent Labs  Lab 06/19/24 1918 06/21/24 1532 06/22/24 0510 06/23/24 0249 06/24/24 0826 06/25/24 0514  ALBUMIN 3.8 3.2* 2.7* 2.9* 2.7* 2.6*  -Continue to Monitor and Trend and repeat CMP in the AM  Overweight: Complicates overall prognosis and care. Estimated body mass index is 28.28 kg/m as calculated from the following:   Height as of this encounter: 5' 1 (1.549 m).   Weight as of this encounter: 67.9 kg. Weight Loss and Dietary Counseling given

## 2024-06-25 NOTE — Progress Notes (Signed)
 Physical Therapy Treatment Patient Details Name: [name] MRN: [id number] DOB: Jul 10, 1932 Today's Date: 06/25/2024   History of Present Illness Pt is an 89 y.o. female presenting 7/5 with RUQ abdominal pain radiating to back, nausea. Found to have suspected cholecystitis, hyponatremia, bacteriuria, hematuria. [facility] ED visit 7/3 Lab work demonstrates acute liver injury but pt leaving without being seen. PMH: COPD, PAD, HLD, OSA, breast cancer.    PT Comments  Pt feeling much better and denies nausea. Pt with increased ambulation tolerance to 100' with RW, no episode of LOB. Pt able to stand with contact guard and perform pericare s/p BM with set up of washcloths. Spoke with daughter and recommending pt have 24/7 supervision/assist initially to allow pt to gain strength and confidence mobilizing indep. Dtr reports I'm not sure what my sisters are doing. Acute PT to cont to follow.    If plan is discharge home, recommend the following: A little help with walking and/or transfers;A little help with bathing/dressing/bathroom;Assist for transportation;Supervision due to cognitive status (initially pt to benefit from 24/7 assist from prolonged hospital stay)   Can travel by private vehicle        Equipment Recommendations  None recommended by PT (has RW)    Recommendations for Other Services       Precautions / Restrictions Precautions Precautions: Fall Restrictions Weight Bearing Restrictions Per Provider Order: No     Mobility  Bed Mobility Overal bed mobility: Needs Assistance Bed Mobility: Supine to Sit     Supine to sit: Contact guard     General bed mobility comments: increased time, HOB elevated, no physical assist    Transfers Overall transfer level: Needs assistance Equipment used: Rolling walker (2 wheels) Transfers: Sit to/from Stand Sit to Stand: Contact guard assist, Min assist           General transfer comment: CGA to power up, verbal cues for safe hand  placement    Ambulation/Gait Ambulation/Gait assistance: Contact guard assist Gait Distance (Feet): 100 Feet Assistive device: Rolling walker (2 wheels) Gait Pattern/deviations: Step-to pattern, Decreased stride length Gait velocity: dec Gait velocity interpretation: <1.31 ft/sec, indicative of household ambulator   General Gait Details: short step length, 2 standing rest breaks   Stairs             Wheelchair Mobility     Tilt Bed    Modified Rankin (Stroke Patients Only)       Balance Overall balance assessment: Needs assistance Sitting-balance support: No upper extremity supported, Feet supported Sitting balance-Leahy Scale: Good     Standing balance support: Single extremity supported, Bilateral upper extremity supported, During functional activity Standing balance-Leahy Scale: Fair Standing balance comment: stood to complete pericare s/p BM                            Communication Communication Communication: No apparent difficulties  Cognition Arousal: Alert Behavior During Therapy: WFL for tasks assessed/performed                             Following commands: Intact      Cueing Cueing Techniques: Verbal cues  Exercises      General Comments General comments (skin integrity, edema, etc.): VSS, supervision and set up required for pericare post BM      Pertinent Vitals/Pain Pain Assessment Pain Assessment: No/denies pain    Home Living  Prior Function            PT Goals (current goals can now be found in the care plan section) Acute Rehab PT Goals Patient Stated Goal: feel better PT Goal Formulation: With patient/family Time For Goal Achievement: 07/07/24 Potential to Achieve Goals: Good Progress towards PT goals: Progressing toward goals    Frequency    Min 2X/week      PT Plan      Co-evaluation              AM-PAC PT 6 Clicks Mobility   Outcome  Measure  Help needed turning from your back to your side while in a flat bed without using bedrails?: A Little Help needed moving from lying on your back to sitting on the side of a flat bed without using bedrails?: A Little Help needed moving to and from a bed to a chair (including a wheelchair)?: A Little Help needed standing up from a chair using your arms (e.g., wheelchair or bedside chair)?: A Little Help needed to walk in hospital room?: A Little Help needed climbing 3-5 steps with a railing? : A Lot 6 Click Score: 17    End of Session Equipment Utilized During Treatment: Gait belt Activity Tolerance: Patient tolerated treatment well Patient left: with call bell/phone within reach;with family/visitor present;in chair (MD present) Nurse Communication: Mobility status PT Visit Diagnosis: Unsteadiness on feet (R26.81)     Time: 8943-8885 PT Time Calculation (min) (ACUTE ONLY): 18 min  Charges:    $Gait Training: 8-22 mins PT General Charges $$ ACUTE PT VISIT: 1 Visit                     Norene Ames, PT, DPT Acute Rehabilitation Services Secure chat preferred Office #: [phone]    Norene CHRISTELLA Ames 06/25/2024, 11:54 AM

## 2024-06-25 NOTE — Code Documentation (Signed)
 Stroke Response Nurse Documentation Code Documentation  [name] is a 89 y.o. female admitted to [facility]  on 06/21/2024 for Cholecystitis with past medical hx of COPD, PAD, HLD, OSA, R breast CA s/p lumpectomy. On No antithrombotic. Code stroke was activated by [facility] RN .   Patient on [facility] unit where she was LKW at 1814 and now complaining of acute onset of altered mental status, left facial droop, right gaze deviation, slurred speech. Per RN, the patient was in her normal state of health when her family noticed she had an acute onset of right gaze, left sided facial droop, altered mental status, and slurred speech.  Stroke team at the bedside after patient activation. Patient to CT with team. NIHSS 15, see documentation for details and code stroke times. Patient with right gaze preference , left hemianopia, left facial droop, left leg weakness, left decreased sensation, and dysarthria  on exam. The following imaging was completed:  CT Head and CTA. Patient is a candidate for IV Thrombolytic due to disabling deficits. Patient is a candidate for IR due to LVO noted on imaging per MD.   Care/Plan: Transferred to IR.   Process Delays Noted: after hours - IR to be called in  Bedside handoff with IR RN Sherrie.    [name]  Stroke Response RN

## 2024-06-25 NOTE — TOC Progression Note (Addendum)
 Transition of Care [facility]) - Progression Note    Patient Details  Name: [name] MRN: [id number] Date of Birth: Apr 21, 1932  [facility] CM/SW Contact  Roxie [name], RN Phone Number: 06/25/2024, 10:07 AM  Clinical Narrative:     Spoke to patient and daughter regarding home health and explained that [facility] aides are not covered by [employer]. Patient has walker and rollator. Patient wants BSC. Patient pays a girl to transport her to apts.  Patient is agreeable to home health PT,RN Patient has used [facility] in the past and is agreeable to use them again.  Cory with [facility] accepted referral.  Address, Phone number and PCP verified. Need home health PT, RN Expected Discharge Plan: Home w Home Health Services Barriers to Discharge: Continued Medical Work up  Expected Discharge Plan and Services   Discharge Planning Services: CM Consult Post Acute Care Choice: Home Health Living arrangements for the past 2 months: Single Family Home                           HH Arranged: RN, OT, PT, Nurse's Aide HH Agency: [facility] Date [facility] Agency Contacted: 06/25/24 Time HH Agency Contacted: 1007 Representative spoke with at [facility] Agency: Darleene   Social Determinants of Health (SDOH) Interventions SDOH Screenings   Food Insecurity: No Food Insecurity (06/22/2024)  Housing: Low Risk  (06/22/2024)  Transportation Needs: No Transportation Needs (06/22/2024)  Utilities: Not At Risk (06/22/2024)  Social Connections: Socially Isolated (06/22/2024)  Tobacco Use: Medium Risk (06/21/2024)    Readmission Risk Interventions     No data to display

## 2024-06-25 NOTE — Progress Notes (Signed)
 Dr. E. Lindzen is at the bedside talking with Dr. JONETTA. At this time. Will continue to monitor and tx pt according to MD orders.

## 2024-06-25 NOTE — Progress Notes (Signed)
 Around 6:15 pm pt's daughter came to hallway yelling for help, went to pt's room and found pt with facial droop, L. Side flaccid, altered mental status and difficulty speaking. Called code stroke and additional nursing staff and MD's to bedside, pt taken to CT and IR and will be transferred to 4N, report called to receiving RN.

## 2024-06-25 NOTE — Progress Notes (Signed)
 Echocardiogram 2D Echocardiogram has been performed.  [name] 06/25/2024, 4:27 PM

## 2024-06-25 NOTE — Procedures (Signed)
 INR.  Status post right common carotid arteriogram.  Right CFA approach.  Findings.  1.  Occluded dominant superior division of the right middle cerebral artery extending into the distal right M1 segment.  Status post revascularization of the distal right M1 segment and the proximal aspect of the superior division with 3 passes of a contact aspiration, and 2 passes with a 4 mm x 40 mm solitaire X retriever device and contact aspiration resulting in repeated  reocclusion secondary to underlying long segment atherosclerotic plaque.  Final revascularization/TICI 2B  Post CT brain no gross mass effect.  Small focal right perisylvian subarachnoid hemorrhage. Manual compression with quick clot applied  at the right groin puncture site for  25 minutes for hemostasis..  Distal pulses all intact unchanged from prior to procedure.  Use of a rescue stent was reviewed with the neurohospitalist.  It was decided not to do so due to the increased risk of intracranial hemorrhage related  to the use of dual antiplatelets, and TNK combination ,and occlusion of the stent given the small caliber of the vessel.  Patient was left intubated to protect her airway.  She was then transported to the neuro ICU.  S.Marcelline Temkin MD

## 2024-06-25 NOTE — H&P (Signed)
 NEUROLOGY H&P NOTE   Date of service: June 25, 2024 Patient Name: [name] MRN:  [id number] DOB:  01-21-1932 Chief Complaint: left sided weakness Requesting Provider: Sherrill Alejandro Donovan, DO  History of Present Illness  [name] is a 89 y.o. female with hx of COPD, peripheral artery disease, hyperlipidemia, obstructive sleep apnea, right breast carcinoma (stage Ia s/p lumpectomy radiation and hormone therapy, in remission)  Her daughter was visiting with her in the hospital today when the patient suddenly had left-sided weakness with right gaze deviation and slurred speech witnessed onset by daughter at 6:15 PM  She initially presented with right upper quadrant abdominal pain, nausea vomiting found to have abnormal LFTs.  There was some concern for possible acalculous cholecystitis and she did have E. coli bacteremia felt to be secondary to gallbladder source which has been appropriately treated with ceftriaxone  for 5 days now.  Hospital course was also complicated by some encephalopathy with hallucinations which have been improving.  Overnight she did have some difficulty breathing which was attributed to volume overload and she was treated with diuretics  Discussed with primary team as well as surgery who agreed her abdominal complaints are not contraindication to thrombolytic administration.  On further review of the chart after acute decision making she was noted to have acute onset of vertigo and gait abnormality in June 2018 with residual truncal ataxia at which time she was had started using a walker and had to be careful driving but was still able to drive.  There was concern for possible brainstem/cerebellar stroke and she was recommended for MRI of the brain but this does not seem to have been completed  LKW: 6:15 PM Modified rankin score: 3-4 (5 hours caregiver help for supervision of showers due to anxiety about bathing, help with dressing but daughter reports she could  dress alone if needed, uses a walker) IV Thrombolysis: Yes, risks / benefits / alternatives discussed  EVT: Yes  NIHSS components Score: Comment  1a Level of Conscious 0[x]  1[]  2[]  3[]      1b LOC Questions 0[x]  1[]  2[]       1c LOC Commands 0[x]  1[]  2[]       2 Best Gaze 0[]  1[x]  2[]       3 Visual 0[]  1[]  2[x]  3[]      4 Facial Palsy 0[]  1[]  2[x]  3[]      5a Motor Arm - left 0[]  1[]  2[]  3[x]  4[]  UN[]    5b Motor Arm - Right 0[x]  1[]  2[]  3[]  4[]  UN[]    6a Motor Leg - Left 0[]  1[]  2[]  3[x]  4[]  UN[]    6b Motor Leg - Right 0[x]  1[]  2[]  3[]  4[]  UN[]    7 Limb Ataxia 0[x]  1[]  2[]  UN[]      8 Sensory 0[]  1[x]  2[]  UN[]      9 Best Language 0[x]  1[]  2[]  3[]      10 Dysarthria 0[]  1[]  2[x]  UN[]      11 Extinct. and Inattention 0[x]  1[]  2[]       TOTAL: 14       ROS  Limited by dysarthria and patient participation, obtained from chart and family as above  Past History   Past Medical History:  Diagnosis Date   Anxiety    Back pain    Cancer (HCC)    Depression    Emphysema    Esophageal spasm    Lung nodule    OSA (obstructive sleep apnea)    Osteomalacia    Personal history of radiation therapy  Vertigo    Vitamin D deficiency     Past Surgical History:  Procedure Laterality Date   ABDOMINAL HYSTERECTOMY     APPENDECTOMY     BREAST BIOPSY Right 08/31/2020   BREAST EXCISIONAL BIOPSY Left 1978   BREAST LUMPECTOMY Right 09/30/2020   BREAST LUMPECTOMY WITH RADIOACTIVE SEED LOCALIZATION Right 09/30/2020   Procedure: RIGHT BREAST LUMPECTOMY WITH RADIOACTIVE SEED LOCALIZATION;  Surgeon: Curvin Deward MOULD, MD;  Location: [facility];  Service: General;  Laterality: Right;   CARPAL TUNNEL RELEASE     ROTATOR CUFF REPAIR      Family History: Family History  Problem Relation Age of Onset   Diabetes type II Other    Hypertension Other    Breast cancer Maternal Aunt        dx early 59s   Other Mother        died of natural causes at age 60   Other Father        stroke or  heart attack while in shower   Diabetes type II Sister    Cancer Paternal Uncle        unknown type; dx early 46s    Social History  reports that she quit smoking about 8 years ago. Her smoking use included cigarettes. She started smoking about 64 years ago. She has a 56 pack-year smoking history. She has never used smokeless tobacco. She reports that she does not drink alcohol and does not use drugs.  Allergies  Allergen Reactions   Adhesive [Tape] Itching and Other (See Comments)    Little red itchy bumps develop   Latex Itching and Other (See Comments)    Little red itchy bumps develop    Medications   Current Facility-Administered Medications:    acetaminophen  (TYLENOL ) tablet 500 mg, 500 mg, Oral, Q6H PRN, Segars, Jonathan, MD   albuterol  (PROVENTIL ) (2.5 MG/3ML) 0.083% nebulizer solution 2.5 mg, 2.5 mg, Nebulization, Q4H PRN, Segars, Jonathan, MD, 2.5 mg at 06/25/24 0314   benzonatate  (TESSALON ) capsule 100 mg, 100 mg, Oral, TID PRN, Rathore, Vasundhra, MD, 100 mg at 06/24/24 2355   buPROPion  (WELLBUTRIN  XL) 24 hr tablet 150 mg, 150 mg, Oral, Daily, Segars, Dorn, MD, 150 mg at 06/25/24 0909   cefTRIAXone  (ROCEPHIN ) 2 g in sodium chloride  0.9 % 100 mL IVPB, 2 g, Intravenous, Q24H, Segars, Dorn, MD, Last Rate: 200 mL/hr at 06/25/24 1735, 2 g at 06/25/24 1735   escitalopram  (LEXAPRO ) tablet 40 mg, 40 mg, Oral, Daily, Segars, Dorn, MD, 40 mg at 06/25/24 9090   melatonin tablet 6 mg, 6 mg, Oral, QHS PRN, Segars, Jonathan, MD, 6 mg at 06/23/24 2230   nitroGLYCERIN  (NITROSTAT ) SL tablet 0.4 mg, 0.4 mg, Sublingual, Q5 min PRN, Sheikh, Omair Latif, DO   ondansetron  (ZOFRAN ) injection 4 mg, 4 mg, Intravenous, Q6H PRN, Segars, Jonathan, MD, 4 mg at 06/24/24 2037   oxyCODONE  (Oxy IR/ROXICODONE ) immediate release tablet 2.5 mg, 2.5 mg, Oral, Q6H PRN **OR** oxyCODONE  (Oxy IR/ROXICODONE ) immediate release tablet 5 mg, 5 mg, Oral, Q6H PRN, Segars, Dorn, MD   pantoprazole   (PROTONIX ) EC tablet 40 mg, 40 mg, Oral, Daily, Segars, Jonathan, MD, 40 mg at 06/25/24 0908   polyethylene glycol (MIRALAX  / GLYCOLAX ) packet 17 g, 17 g, Oral, Daily PRN, Segars, Jonathan, MD   sodium chloride  flush (NS) 0.9 % injection 3 mL, 3 mL, Intravenous, Q12H, Segars, Jonathan, MD, 3 mL at 06/25/24 0909   tenecteplase  (TNKASE ) injection for Stroke 17 mg, 0.25 mg/kg, Intravenous, Once,  Jacy Howat L, MD   umeclidinium bromide  (INCRUSE ELLIPTA ) 62.5 MCG/ACT 1 puff, 1 puff, Inhalation, Daily, Keturah Carrier, MD  Vitals   Vitals:   06/25/24 0955 06/25/24 1249 06/25/24 1831 06/25/24 1833  BP: (!) 103/51 (!) 142/57 (!) 148/60 (!) 127/110  Pulse: 70  (!) 124 72  Resp: 20  16   Temp: 98.5 F (36.9 C)  97.6 F (36.4 C)   TempSrc: Oral  Oral   SpO2: 91%  (!) 89% 96%  Weight:      Height:        Body mass index is 28.28 kg/m.   Physical Exam   Constitutional: Appears frail, ill Psych: Affect flat, mildly anxious /reluctant to participate with parts of the examination Eyes: No scleral injection HENT: No oropharyngeal obstruction.  Some lesions of the left lip MSK: no joint deformities.  Cardiovascular: Perfusing extremities well Respiratory: Somewhat labored breathing GI: Soft.  No distension. There is no tenderness.    Neurologic Examination   Mental Status: Patient is awake, alert, oriented to person, place, month, and year, but unaware of her deficits Language testing is limited by patient not wanting to participate as well as dysarthria, but in the questions she does answer her and her spontaneous speech, no definite aphasia.  Dysarthria fluctuates from moderate to severe Cranial Nerves: II: Visual Fields are notable for left hemianopia.  III,IV, VI: Right gaze preference, can look at least to midline with encouragement VII: Facial movement is notable for significant left facial droop VIII: hearing is somewhat hard of hearing at baseline Motor: Slight movement of  the left arm and leg, but these limbs are not antigravity.  No drift of the right arm.  Right leg drifts slightly but does not hit the bed Sensory: Diminished in the left arm and leg Cerebellar: Finger-to-nose intact in the right upper extremity.  Patient declined heel-to-shin testing   Labs/Imaging/Neurodiagnostic studies   CBC:  Recent Labs  Lab 22-Jun-2024 1532 06/22/24 0510 06/24/24 0826 06/25/24 0514  WBC 14.0*   < > 7.9 7.7  NEUTROABS 11.8*  --   --   --   HGB 13.0   < > 11.1* 11.2*  HCT 37.3   < > 32.8* 33.6*  MCV 93.0   < > 91.6 93.6  PLT 102*   < > 121* 143*   < > = values in this interval not displayed.   Basic Metabolic Panel:  Lab Results  Component Value Date   NA 135 06/25/2024   K 4.0 06/25/2024   CO2 25 06/25/2024   GLUCOSE 120 (H) 06/25/2024   BUN 7 (L) 06/25/2024   CREATININE 0.74 06/25/2024   CALCIUM  7.8 (L) 06/25/2024   GFRNONAA >60 06/25/2024   GFRAA >60 09/08/2020   CT Head without contrast(Personally reviewed): no acute intracranial process   CT angio Head and Neck with contrast(Personally reviewed): Right M1   ASSESSMENT   [name] is a 89 y.o. female presenting with acute onset right MCA occlusion with stroke risk factors of age, peripheral vascular disease, hyperlipidemia, obstructive sleep apnea,  RECOMMENDATIONS   # Emergent recommendations - TNK - CTA head and neck - Appreciate neuro IR for thrombectomy  # Right MCA M1 occlusion, most likely cardioembolic etiology but needs further workup to determine - Stroke labs HgbA1c, fasting lipid panel - MRI brain 24 hours post TNK - Frequent neuro checks - Echocardiogram already completed earlier today, will hold off on repeat unless new cardiac concerns arise - Hold  antiplatelets and chemo DVT PPx for at least 24 hours until post TNK head imaging completed  - Risk factor modification - Telemetry monitoring - Blood pressure goal   - Post TNK for 24  hours < 180/105, then  - Post  successful uncomplicated revascularization SBP 120 - 140 for 24 hours; if complications have arisen or only partial revascularization reach out to interventionalist or neurologist on call for BP goal - PT consult, OT consult, Speech consult, unless patient is back to baseline - Stroke team to primary  Given her significant medical comorbidities, I have also consulted CCM for comanagement and spoke with Dr. Orlin Fairly   ______________________________________________________________________   Lola Jernigan MD-PhD [facility] [phone] Available 7 AM to 7 PM, outside these hours please contact Neurologist on call listed on AMION   CRITICAL CARE Performed by: Lola LITTIE Jernigan   Total critical care time: 70 minutes  Critical care time was exclusive of separately billable procedures and treating other patients.  Critical care was necessary to treat or prevent imminent or life-threatening deterioration.  Critical care was time spent personally by me on the following activities: development of treatment plan with patient and/or surrogate as well as nursing, discussions with consultants, evaluation of patient's response to treatment, examination of patient, obtaining history from patient or surrogate, ordering and performing treatments and interventions, ordering and review of laboratory studies, ordering and review of radiographic studies, pulse oximetry and re-evaluation of patient's condition.

## 2024-06-25 NOTE — Anesthesia Procedure Notes (Addendum)
 Arterial Line Insertion Start/End7/08/2024 7:38 PM, 06/25/2024 7:46 PM Performed by: Darlyn Rush, MD, anesthesiologist  Patient location: Pre-op. Preanesthetic checklist: patient identified, IV checked, site marked, risks and benefits discussed, surgical consent, monitors and equipment checked, pre-op evaluation, timeout performed and anesthesia consent Lidocaine  1% used for infiltration Left, radial was placed Catheter size: 20 G Hand hygiene performed , maximum sterile barriers used  and Seldinger technique used  Attempts: 2 (1st by Dr. Merla) Procedure performed without using ultrasound guided technique. Following insertion, dressing applied and Biopatch. Post procedure assessment: normal and unchanged  Post procedure complications: unsuccessful attempts and second provider assisted. Patient tolerated the procedure well with no immediate complications.

## 2024-06-25 NOTE — Plan of Care (Signed)

## 2024-06-25 NOTE — Progress Notes (Signed)
 Subjective/Chief Complaint: PT doing well HIDA results reviewed Tol FLD   Objective: Vital signs in last 24 hours: Temp:  [97.5 F (36.4 C)-98.9 F (37.2 C)] 97.5 F (36.4 C) (07/09 0405) Pulse Rate:  [72-124] 72 (07/09 0405) Resp:  [16-20] 16 (07/09 0405) BP: (105-118)/(47-63) 118/47 (07/09 0405) SpO2:  [92 %-96 %] 96 % (07/09 0405) Last BM Date : 06/24/24  Intake/Output from previous day: 07/08 0701 - 07/09 0700 In: 640.1 [P.O.:240; IV Piggyback:400.1] Out: 950 [Urine:950] Intake/Output this shift: No intake/output data recorded.  PE:  Constitutional: No acute distress, conversant, appears states age. Eyes: Anicteric sclerae, moist conjunctiva, no lid lag Lungs: Clear to auscultation bilaterally, normal respiratory effort CV: regular rate and rhythm, no murmurs, no peripheral edema, pedal pulses 2+ GI: Soft, no masses or hepatosplenomegaly, non-tender to palpation Skin: No rashes, palpation reveals normal turgor Psychiatric: appropriate judgment and insight, oriented to person, place, and time   Lab Results:  Recent Labs    06/24/24 0826 06/25/24 0514  WBC 7.9 7.7  HGB 11.1* 11.2*  HCT 32.8* 33.6*  PLT 121* 143*   BMET Recent Labs    06/24/24 0826 06/25/24 0514  NA 137 135  K 2.9* 4.0  CL 103 103  CO2 22 25  GLUCOSE 94 120*  BUN 5* 7*  CREATININE 0.61 0.74  CALCIUM  8.3* 7.8*   PT/INR No results for input(s): LABPROT, INR in the last 72 hours. ABG No results for input(s): PHART, HCO3 in the last 72 hours.  Invalid input(s): PCO2, PO2  Studies/Results: NM Hepatobiliary Liver Func Result Date: 06/24/2024 CLINICAL DATA:  Cholecystitis. EXAM: NUCLEAR MEDICINE HEPATOBILIARY IMAGING TECHNIQUE: Sequential images of the abdomen were obtained out to 60 minutes following intravenous administration of radiopharmaceutical. RADIOPHARMACEUTICALS:  5 mCi Tc-46m  Choletec  IV 2.7 mg morphine  also administered IV. COMPARISON:  MRI 06/22/2024  FINDINGS: Satisfactory uptake of radiopharmaceutical from the blood pool. Biliary activity visible at 8 minutes. Bowel activity visible at 14 minutes. Initial non filling of the gallbladder over 60 minutes. We administered the 2.7 mg of morphine  IV to cause contraction of the sphincter of OD. Subsequently the gallbladder opacifies, first becoming visible at 19 minutes post morphine  administration and subsequently demonstrating increasing activity until the 30 minute mark. Accordingly the cystic duct and common bile duct are both patent. IMPRESSION: 1. Patent cystic duct, accordingly no supportive evidence for acute cholecystitis. 2. Patent common bile duct 3. Overall normal exam. Electronically Signed   By: Ryan Salvage M.D.   On: 06/24/2024 13:57   DG CHEST PORT 1 VIEW Result Date: 06/23/2024 CLINICAL DATA:  Cough EXAM: PORTABLE CHEST 1 VIEW COMPARISON:  Chest radiograph February 18, 2024 FINDINGS: The heart size and mediastinal contours are within normal limits. Increased interstitial markings of both lung fields without significant consolidation or suspicious pulmonary nodule, increased to prior. Atherosclerotic calcifications of aortic knob and descending aorta. Chronic left rib fractures with mild displacement. No pleural effusion or pneumothorax. IMPRESSION: Diffuse increased interstitial markings of both lung fields without focal consolidation or suspicious pulmonary nodules which can be seen the setting of infection/inflammation, or early pulmonary edema. Correlate with clinical findings. . Electronically Signed   By: Megan  Zare M.D.   On: 06/23/2024 17:15    Anti-infectives: Anti-infectives (From admission, onward)    Start     Dose/Rate Route Frequency Ordered Stop   06/22/24 1700  cefTRIAXone  (ROCEPHIN ) 2 g in sodium chloride  0.9 % 100 mL IVPB        2  g 200 mL/hr over 30 Minutes Intravenous Every 24 hours 06/21/24 1932     06/22/24 0500  metroNIDAZOLE  (FLAGYL ) IVPB 500 mg        500  mg 100 mL/hr over 60 Minutes Intravenous Every 12 hours 06/21/24 1932     06/21/24 1745  cefTRIAXone  (ROCEPHIN ) 2 g in sodium chloride  0.9 % 100 mL IVPB       Placed in And Linked Group   2 g 200 mL/hr over 30 Minutes Intravenous  Once 06/21/24 1730 06/21/24 1814   06/21/24 1745  metroNIDAZOLE  (FLAGYL ) IVPB 500 mg       Placed in And Linked Group   500 mg 100 mL/hr over 60 Minutes Intravenous  Once 06/21/24 1730 06/21/24 1857       Assessment/Plan: Elevated LFTs and RUQ pain Possible acalculous cholecystitis   HIDA neg  FEN: FLD, adv diet as tol. IVF per TRH VTE: ok to have LMWH or SQH from surgery standpoint ID: [email or web address] /[email or web address]    Advance diet as tol No surgical plans OK for DC when appropriate from MEdical service. Call if needed  LOS: 4 days    Lynda Leos 06/25/2024

## 2024-06-25 NOTE — Transfer of Care (Signed)
 Immediate Anesthesia Transfer of Care Note  Patient: [name]  Procedure(s) Performed: RADIOLOGY WITH ANESTHESIA  Patient Location: ICU  Anesthesia Type:General  Level of Consciousness: Patient remains intubated per anesthesia plan  Airway & Oxygen Therapy: Patient remains intubated per anesthesia plan  Post-op Assessment: Report given to RN and Post -op Vital signs reviewed and stable  Post vital signs: Reviewed and stable  Last Vitals:  Vitals Value Taken Time  BP    Temp    Pulse 54 06/25/24 22:51  Resp 14 06/25/24 22:51  SpO2 100 % 06/25/24 22:51  Vitals shown include unfiled device data.  Last Pain:  Vitals:   06/25/24 1831  TempSrc: Oral  PainSc:          Complications: No notable events documented.

## 2024-06-25 NOTE — Consult Note (Incomplete)
 NAME:  [name], MRN:  [id number], DOB:  [birth year], LOS: 4 ADMISSION DATE:  06/21/2024 CONSULTATION DATE:  06/25/2024 REFERRING MD:  Jerrie - Neuro, CHIEF COMPLAINT:  Code Stroke, R MCA M1   History of Present Illness:  89 year old woman who presented to initially presented to [facility] 7/5 for RUQ pain, nausea and vomiting. PMHx significant for HLD, PAD, COPD with emphysema, OSA, R breast CA (IDC, stage 1a s/p lumpectomy/XRT/hormone therapy), back pain.  Patient initially presented to [facility] ED 7/3 with nausea/vomiting; her LFTs were abnormal at that time but she left the ED without being seen. She presented back to [facility] 7/5 for RUQ pain with associated nausea/vomiting and poor PO intake. CT A/P was completed with concern for cholecystitis versus passed choledocholithiasis and acute liver injury. She was admitted for symptom management/CCS consult. Ultimately believed to have acalculous cholecystitis with recommendation for MRCP, IV rehydration and broad-spectrum antibiotics. BCx were noted to be positive for E. Coli (1/4) and ceftriaxone /Flagyl  on board. MRCP 7/6 was unfortunately a poor study due to motion but did not demonstrate acute cholecystitis or biliary dilatation, ?hemangioma R lobe. HIDA 7/8 demonstrated patent cystic duct/CBD negating need for IR drain or cholecystectomy.  On 7/9 at ~1815, patient's daughter noticed sudden onset AMS, L-sided weakness, R gaze deviation and slurred speech. LKW 1814. Code Stroke was called with initial NIHSS 15. CT Head NAICA. CTA Head/Neck demonstrated abrupt occlusion of distal R MCA M1. TNK administered 1851. NIR consulted and taken emergently for mechanical thrombectomy. Unfortunately, revascularization was completed with repeated reocclusion 2/2 long segment atherosclerotic plaque. Decision not to place stent in the settng of increased ICH risk with DAPT and small vessel caliber. Post-procedure CT Head demonstrated small SAH. Patient remained intubated for airway  protection.  PCCM consulted for post-procedure management.  Pertinent Medical History:   Past Medical History:  Diagnosis Date   Anxiety    Back pain    Cancer (HCC)    Depression    Emphysema    Esophageal spasm    Lung nodule    OSA (obstructive sleep apnea)    Osteomalacia    Personal history of radiation therapy    Vertigo    Vitamin D deficiency    Significant Hospital Events: Including procedures, antibiotic start and stop dates in addition to other pertinent events   7/5 - Presented to [facility] for RUQ pain, n/v, poor PO intake. Acute liver injury. Initial concern for cholecystitis. CCS consulted. Bcx +E. Coli (1/4), ceftriaxone /Flagyl  initiated. 7/6 - Concern for acalculous cholecystitis. MRCP poor study, no evidence of stones/significant cholecystitis/bil dil. 7/8 - HIDA negative for cholecystitis, patent CBD/cystic duct. No indication for PCT/cholecystectomy. 7/9 - Code Stroke called ~1815 for acute onset of L-sided weakness/R gaze preference/dysarthria/AMS. CT Head NAICA. TNK given 1851. CTA Head/Neck +abrupt occlusion of R MCA M1 segment. Taken to [facility]. Revascularization completed but unfortunately repeatedly reoccluded. No stent due to increased risk of ICH with DAPT. Left intubated. PCCM consulted.  Interim History / Subjective:  PCCM consulted for post-procedure management.  Objective:  Blood pressure (!) 154/77, pulse 72, temperature 97.6 F (36.4 C), temperature source Oral, resp. rate 16, height 5' 1 (1.549 m), weight 67.9 kg, SpO2 96%.        Intake/Output Summary (Last 24 hours) at 06/25/2024 2025 Last data filed at 06/25/2024 0252 Gross per 24 hour  Intake 320 ml  Output 950 ml  Net -630 ml   Filed Weights   06/21/24 1525 06/21/24 2052  Weight: 67.1 kg  67.9 kg   Physical Examination: General: Acutely ill-appearing elderly woman in NAD. HEENT: [place]/AT, anicteric sclera, PERRL 3mm brisk, moist mucous membranes. Lips with scant dried blood,  dry/cracking. Neuro: Intubated, sedated. Does not respond to verbal, tactile or noxious stimuli. Grimaces to pain in all 4 extremities and with noxious stimuli. Not following commands. No spontaneous movement of extremities witnessed on exam. +Corneal, +Cough, and +Gag.  CV: RRR, no m/g/r. PULM: Breathing even and unlabored on vent (PEEP 5, FiO2 40%). Lung fields CTAB. GI: Soft, nontender, mildly distended. Normoactive bowel sounds. Extremities: No LE edema noted. Skin: Warm/dry, no rashes.  Resolved Hospital Problem List:    Assessment & Plan:  Acute R MCA M1 occlusion CT Head NAICA. CTA Head/Neck +abrupt occlusion of R MCA M1 segment. - Stroke team primary - S/p attempted NIR mechanical thrombectomy with repeated reocclusion - Goal SBP 120-160 - Cleviprex  titrated to goal SBP - F/u MRI Brain - F/u Echo, lipid panel, A1c - Frequent neuro checks - Neuroprotective measures: HOB > 30 degrees, normoglycemia, normothermia, electrolytes WNL - PT/OT/SLP when able to participate in care  Post-procedure respiratory insufficiency requiring MV COPD with emphysema History of OSA - Continue full vent support (4-8cc/kg IBW) - Wean FiO2 for O2 sat > 90% - Daily WUA/SBT once appropriate from a mental status standpoint - VAP bundle - Pulmonary hygiene - PAD protocol for sedation: Propofol , Fentanyl , and Versed  for goal RASS -1 to -2  ?UTI E. Coli - Trend WBC, fever curve - F/u Cx data - Continue broad-spectrum antibiotics (ceftriaxone )  PAD HLD - F/u lipid panel as above - ASA/statin resumption, may require high-dose statin  Back pain - Supportive care  R breast CA IDC, stage 1a s/p lumpectomy/XRT/hormone therapy, in remission. - Surveillance monitoring per protocol  Best Practice: (right click and Reselect all SmartList Selections daily)   Diet/type: NPO DVT prophylaxis: SCDs GI prophylaxis: PPI Lines: N/A Foley:  Yes, and it is still needed Code Status:  full code Last  date of multidisciplinary goals of care discussion [Per Primary Team]  Labs:  CBC: Recent Labs  Lab 06/21/24 1532 06/22/24 0510 06/23/24 0249 06/24/24 0826 06/25/24 0514  WBC 14.0* 10.0 10.8* 7.9 7.7  NEUTROABS 11.8*  --   --   --   --   HGB 13.0 11.0* 11.6* 11.1* 11.2*  HCT 37.3 32.7* 34.7* 32.8* 33.6*  MCV 93.0 91.6 94.6 91.6 93.6  PLT 102* 92* 115* 121* 143*   Basic Metabolic Panel: Recent Labs  Lab 06/21/24 1532 06/22/24 0510 06/23/24 0249 06/24/24 0826 06/25/24 0514  NA 133* 135 137 137 135  K 3.7 3.3* 3.6 2.9* 4.0  CL 97* 104 104 103 103  CO2 25 23 24 22 25   GLUCOSE 99 100* 109* 94 120*  BUN 26* 14 7* 5* 7*  CREATININE 0.93 0.76 0.67 0.61 0.74  CALCIUM  8.9 8.0* 8.3* 8.3* 7.8*  MG  --  1.8  --   --  1.7  PHOS  --  2.6  --   --   --    GFR: Estimated Creatinine Clearance: 42 mL/min (by C-G formula based on SCr of 0.74 mg/dL). Recent Labs  Lab 06/21/24 1736 06/22/24 0510 06/23/24 0249 06/24/24 0826 06/25/24 0514  WBC  --  10.0 10.8* 7.9 7.7  LATICACIDVEN 0.8  --   --   --   --    Liver Function Tests: Recent Labs  Lab 06/21/24 1532 06/22/24 0510 06/23/24 0249 06/24/24 0826 06/25/24 0514  AST 155*  73* 40 22 17  ALT 334* 223* 163* 97* 70*  ALKPHOS 103 89 90 77 66  BILITOT 1.3* 0.9 0.8 0.9 0.6  PROT 6.3* 5.3* 5.8* 5.6* 5.4*  ALBUMIN 3.2* 2.7* 2.9* 2.7* 2.6*   Recent Labs  Lab 06/19/24 1918 06/21/24 1532  LIPASE 31 27   No results for input(s): AMMONIA in the last 168 hours.  ABG:    Component Value Date/Time   TCO2 28 03/14/2020 1723    Coagulation Profile: No results for input(s): INR, PROTIME in the last 168 hours.  Cardiac Enzymes: No results for input(s): CKTOTAL, CKMB, CKMBINDEX, TROPONINI in the last 168 hours.  HbA1C: No results found for: HGBA1C  CBG: Recent Labs  Lab 06/25/24 1823  GLUCAP 103*   Review of Systems:   Patient is encephalopathic and/or intubated; therefore, history has been obtained  from chart review.   Past Medical History:  She,  has a past medical history of Anxiety, Back pain, Cancer (HCC), Depression, Emphysema, Esophageal spasm, Lung nodule, OSA (obstructive sleep apnea), Osteomalacia, Personal history of radiation therapy, Vertigo, and Vitamin D deficiency.   Surgical History:   Past Surgical History:  Procedure Laterality Date   ABDOMINAL HYSTERECTOMY     APPENDECTOMY     BREAST BIOPSY Right 08/31/2020   BREAST EXCISIONAL BIOPSY Left 1978   BREAST LUMPECTOMY Right 09/30/2020   BREAST LUMPECTOMY WITH RADIOACTIVE SEED LOCALIZATION Right 09/30/2020   Procedure: RIGHT BREAST LUMPECTOMY WITH RADIOACTIVE SEED LOCALIZATION;  Surgeon: Curvin Deward MOULD, MD;  Location: [facility];  Service: General;  Laterality: Right;   CARPAL TUNNEL RELEASE     ROTATOR CUFF REPAIR      Social History:   reports that she quit smoking about 8 years ago. Her smoking use included cigarettes. She started smoking about 64 years ago. She has a 56 pack-year smoking history. She has never used smokeless tobacco. She reports that she does not drink alcohol and does not use drugs.   Family History:  Her family history includes Breast cancer in her maternal aunt; Cancer in her paternal uncle; Diabetes type II in her sister and another family member; Hypertension in an other family member; Other in her father and mother.   Allergies: Allergies  Allergen Reactions   Adhesive [Tape] Itching and Other (See Comments)    Little red itchy bumps develop   Latex Itching and Other (See Comments)    Little red itchy bumps develop    Home Medications: Prior to Admission medications   Medication Sig Start Date End Date Taking? Authorizing Provider  acetaminophen  (TYLENOL ) 650 MG CR tablet Take 650 mg by mouth 3 (three) times daily as needed for pain.   Yes [provider]  albuterol  (PROVENTIL  HFA;VENTOLIN  HFA) 108 (90 Base) MCG/ACT inhaler Inhale 2 puffs into the lungs every 6  (six) hours as needed for wheezing or shortness of breath.   Yes [provider]  aspirin  EC 81 MG tablet Take 81 mg by mouth daily.   Yes [provider]  atorvastatin  (LIPITOR) 10 MG tablet Take 10 mg by mouth at bedtime.   Yes [provider]  Cholecalciferol (VITAMIN D) 2000 units tablet Take 4,000 Units by mouth daily.    Yes [provider]  escitalopram  (LEXAPRO ) 20 MG tablet Take 40 mg by mouth daily. 08/01/17  Yes [provider]  Multiple Vitamin (MULTIVITAMIN WITH MINERALS) TABS tablet Take 1 tablet by mouth daily.   Yes [provider]  pantoprazole  (PROTONIX )  40 MG tablet Take 40 mg by mouth daily.   Yes [provider]  Polyethyl Glycol-Propyl Glycol (SYSTANE) 0.4-0.3 % SOLN Place 1 drop into both eyes daily as needed (dry eyes).   Yes [provider]  tiotropium (SPIRIVA) 18 MCG inhalation capsule Place 18 mcg into inhaler and inhale daily.   Yes [provider]  WELLBUTRIN  XL 150 MG 24 hr tablet 1 tablet in the morning Orally Once a day   Yes [provider]   Critical care time:    The patient is critically ill with multiple organ system failure and requires high complexity decision making for assessment and support, frequent evaluation and titration of therapies, advanced monitoring, review of radiographic studies and interpretation of complex data.   Critical Care Time devoted to patient care services, exclusive of separately billable procedures, described in this note is 38 minutes.  Corean CHRISTELLA Nguyet Mercer, PA-C  Pulmonary & Critical Care 06/25/24 8:25 PM  Please see [email or web address] for pager details.  From 7A-7P if no response, please call [phone] After hours, please call ELink [phone]

## 2024-06-25 NOTE — Progress Notes (Signed)
 PROGRESS NOTE    FIDELA CIESLAK  FMW:[id number] DOB: [birth year] DOA: 06/21/2024 PCP: [name], MD   Brief Narrative:  The patient is an 89 y.o. female with hx of COPD, PAD, hyperlipidemia, OSA, right breast invasive ductal carcinoma stage Ia s/p lumpectomy, radiation, hormone therapy, who presented with right upper quadrant abdominal pain radiating to the back and nausea and vomiting.  Patient was found to have abnormal LFTs.  She was hospitalized for further management.  Further workup reveals an E. coli bacteremia and concern for possible acalculous cholecystitis.  General surgery was consulted and her abdominal pain improved.  Her HIDA scan was done and was negative so surgery felt that her diet could be advanced.  She is being continued on antibiotics with IV ceftriaxone .  On the evening of 06/25/2024 patient had a code stroke called and had a neurological deficit and acute change in her neurological function.  She underwent stat imaging and CT of the head was unremarkable but CT angio of the head and neck showed a right MCA M1 occlusion that is most likely cardioembolic in etiology.  She was given TNK and taken for thrombectomy.  Subsequently she is being admitted to the ICU and transferred to the neurological service.    Assessment and Plan:  Abdominal pain/abnormal LFTs/concern for cholecystitis She was noted to have significantly elevated AST ALT bilirubin.  Bilirubin has been normal for the last 48 hours.  AST and ALT have improved as well. Imaging studies raises concern for cholecystitis.  Right upper quadrant ultrasound was nonspecific.  No gallstones were noted.  CBD was noted to be upper limit of normal.  Hepatic lobe lesion was noted thought to be hemangioma.   Patient was seen by general surgery.  They ordered MRI of the abdomen which was a poor study due to motion.  However no cholecystitis was evident. Patient is still tender in the right upper quadrant.  Blood cultures  positive for E. coli.  Clinically she appears to have gallbladder disease but would defer further management to general surgery.  They have ordered a HIDA scan which is negative. Hepatitis panel is unremarkable. Continue with ceftriaxone  but will discontinue metronidazole  -Surgery has now signed off the case and recommended advancement of diet.  Acute CVA: Had neurological change late this afternoon and a stat head CT was done as a code stroke.  Underwent further workup and CTA of the head neck reveals that she has a M1 occlusion on the right side.  Neurology will be consulted and she is undergoing further stroke workup.  Ordered echocardiogram this morning given her dyspnea last night.  Further care per stroke service and given that she is getting TNK and getting a thrombectomy she will be moved to the ICU care will be assumed by neurological team   Acute encephalopathy with hallucinations Most likely due to hospital stay and Ativan .  No focal neurological deficits on examination.  Continue to monitor.  Reorient frequently. Seems to be better this morning.   E. coli bacteremia Most likely source is gallbladder.  UA was noted to be abnormal however patient denies any urinary symptoms and unfortunately urine culture was never sent. E. coli is pansensitive in the blood. Continue ceftriaxone .   Hyponatremia: Resolved.  Last sodium was 135   Hypokalemia: Supplemented.  Last potassium was 4.0  Abnormal UA: No symptoms of dysuria.  Is on ceftriaxone .  Continue to monitor.     History of COPD: Patient has had coughing episodes the last  few days.  Chest x-ray yesterday showed prominent interstitial markings.  Nonspecific finding.  Repeat chest x-ray done and BNP was elevated.  Continue to monitor respiratory status and continue with nebs  Acute on chronic diastolic CHF: Had some dyspnea and crackles noted.  Given IV Lasix  last night with improvement.  Will give another dose of IV Lasix  20 mg today.   Repeating echocardiogram and echocardiogram showed an EF of 60 to 65% with grade 2 diastolic dysfunction which is a change from the prior grade 1 diastolic function.  Continue monitor for signs and symptoms of volume overload.  Peripheral artery disease: Was holding aspirin  and statin given concern for possible surgical intervention but this now needs to be resumed and given that she had a acute CVA will defer further care to the neurology team for anticoagulation and antiplatelet recommendations.   History of right breast carcinoma: She is status post lumpectomy radiation and hormone treatment and is in remission.   Normocytic anemia/thrombocytopenia: Could be related to acute illness.  Improving. Hgb/Hct and Plt Count Trend: Recent Labs  Lab 06/19/24 1918 06/21/24 1532 06/22/24 0510 06/23/24 0249 06/24/24 0826 06/25/24 0514  HGB 13.8 13.0 11.0* 11.6* 11.1* 11.2*  HCT 41.0 37.3 32.7* 34.7* 32.8* 33.6*  MCV 93.8 93.0 91.6 94.6 91.6 93.6  PLT 122* 102* 92* 115* 121* 143*  - Anemia panel done and showed an iron level of 18, UIBC of 203, TIBC of 221, saturation ratios of 8%, ferritin level 210, folate of 16.0, vitamin B12 941.  Continue to monitor for signs of bleeding; no overt bleeding noted.  Repeat CBC in a.m.  Hypoalbuminemia -Patient's Albumin Trend: Recent Labs  Lab 06/19/24 1918 06/21/24 1532 06/22/24 0510 06/23/24 0249 06/24/24 0826 06/25/24 0514  ALBUMIN 3.8 3.2* 2.7* 2.9* 2.7* 2.6*  -Continue to Monitor and Trend and repeat CMP in the AM  Overweight: Complicates overall prognosis and care. Estimated body mass index is 28.28 kg/m as calculated from the following:   Height as of this encounter: 5' 1 (1.549 m).   Weight as of this encounter: 67.9 kg. Weight Loss and Dietary Counseling given   DVT prophylaxis: SCD's Start: 06/25/24 1953 SCDs Start: 06/21/24 1927    Code Status: Full Code Family Communication: Discussed with the daughter at bedside  Disposition Plan:   Level of care: ICU Status is: Inpatient Remains inpatient appropriate because: Had an acute CVA and will be going to the intensive care unit for further care per the stroke team   Consultants:  Neurology General Surgery  Procedures:  Echocardiogram  Antimicrobials:  Anti-infectives (From admission, onward)    Start     Dose/Rate Route Frequency Ordered Stop   06/22/24 1700  cefTRIAXone  (ROCEPHIN ) 2 g in sodium chloride  0.9 % 100 mL IVPB        2 g 200 mL/hr over 30 Minutes Intravenous Every 24 hours 06/21/24 1932     06/22/24 0500  metroNIDAZOLE  (FLAGYL ) IVPB 500 mg  Status:  Discontinued        500 mg 100 mL/hr over 60 Minutes Intravenous Every 12 hours 06/21/24 1932 06/25/24 1359   06/21/24 1745  cefTRIAXone  (ROCEPHIN ) 2 g in sodium chloride  0.9 % 100 mL IVPB       Placed in And Linked Group   2 g 200 mL/hr over 30 Minutes Intravenous  Once 06/21/24 1730 06/21/24 1814   06/21/24 1745  metroNIDAZOLE  (FLAGYL ) IVPB 500 mg       Placed in And Linked Group  500 mg 100 mL/hr over 60 Minutes Intravenous  Once 06/21/24 1730 06/21/24 1857       Subjective: Seen and examined at bedside twice today the first time she is awake and alert and has no issues with the second time she was having acute CVA.  Unable to articulate her words and had a facial droop.  Denied any pain.  Daughter at bedside very tearful.  Objective: Vitals:   06/25/24 1854 06/25/24 1856 06/25/24 1857 06/25/24 1900  BP: (!) 154/33 (!) 140/103 (!) 142/84 (!) 154/77  Pulse:      Resp:      Temp:      TempSrc:      SpO2:      Weight:      Height:        Intake/Output Summary (Last 24 hours) at 06/25/2024 2032 Last data filed at 06/25/2024 0252 Gross per 24 hour  Intake 320 ml  Output 950 ml  Net -630 ml   Filed Weights   06/21/24 1525 06/21/24 2052  Weight: 67.1 kg 67.9 kg   Examination: Physical Exam:  Constitutional: Overweight elderly Caucasian female who appears in some distress given her  acute CVA Respiratory: Diminished to auscultation bilaterally, no wheezing, rales, rhonchi or crackles. Normal respiratory effort and patient is not tachypenic. No accessory muscle use.  Unlabored breathing Cardiovascular: RRR, no murmurs / rubs / gallops. S1 and S2 auscultated. No extremity edema. Abdomen: Soft, non-tender, distended secondary body habitus. Bowel sounds positive.  GU: Deferred. Musculoskeletal: No clubbing / cyanosis of digits/nails. No joint deformity upper and lower extremities.  Skin: No rashes, lesions, ulcers on limited skin evaluation. No induration; Warm and dry.  Neurologic: Has a facial droop, slurred speech, right gaze deviation and she is not awake and alert fully Psychiatric: Appears anxious  Data Reviewed: I have personally reviewed following labs and imaging studies  CBC: Recent Labs  Lab 06/21/24 1532 06/22/24 0510 06/23/24 0249 06/24/24 0826 06/25/24 0514  WBC 14.0* 10.0 10.8* 7.9 7.7  NEUTROABS 11.8*  --   --   --   --   HGB 13.0 11.0* 11.6* 11.1* 11.2*  HCT 37.3 32.7* 34.7* 32.8* 33.6*  MCV 93.0 91.6 94.6 91.6 93.6  PLT 102* 92* 115* 121* 143*   Basic Metabolic Panel: Recent Labs  Lab 06/21/24 1532 06/22/24 0510 06/23/24 0249 06/24/24 0826 06/25/24 0514  NA 133* 135 137 137 135  K 3.7 3.3* 3.6 2.9* 4.0  CL 97* 104 104 103 103  CO2 25 23 24 22 25   GLUCOSE 99 100* 109* 94 120*  BUN 26* 14 7* 5* 7*  CREATININE 0.93 0.76 0.67 0.61 0.74  CALCIUM  8.9 8.0* 8.3* 8.3* 7.8*  MG  --  1.8  --   --  1.7  PHOS  --  2.6  --   --   --    GFR: Estimated Creatinine Clearance: 42 mL/min (by C-G formula based on SCr of 0.74 mg/dL). Liver Function Tests: Recent Labs  Lab 06/21/24 1532 06/22/24 0510 06/23/24 0249 06/24/24 0826 06/25/24 0514  AST 155* 73* 40 22 17  ALT 334* 223* 163* 97* 70*  ALKPHOS 103 89 90 77 66  BILITOT 1.3* 0.9 0.8 0.9 0.6  PROT 6.3* 5.3* 5.8* 5.6* 5.4*  ALBUMIN 3.2* 2.7* 2.9* 2.7* 2.6*   Recent Labs  Lab  06/19/24 1918 06/21/24 1532  LIPASE 31 27   No results for input(s): AMMONIA in the last 168 hours. Coagulation Profile: No results for input(s):  INR, PROTIME in the last 168 hours. Cardiac Enzymes: No results for input(s): CKTOTAL, CKMB, CKMBINDEX, TROPONINI in the last 168 hours. BNP (last 3 results) No results for input(s): PROBNP in the last 8760 hours. HbA1C: No results for input(s): HGBA1C in the last 72 hours. CBG: Recent Labs  Lab 06/25/24 1823  GLUCAP 103*   Lipid Profile: No results for input(s): CHOL, HDL, LDLCALC, TRIG, CHOLHDL, LDLDIRECT in the last 72 hours. Thyroid  Function Tests: No results for input(s): TSH, T4TOTAL, FREET4, T3FREE, THYROIDAB in the last 72 hours. Anemia Panel: Recent Labs    06/24/24 0826  VITAMINB12 941*  FOLATE 16.0  FERRITIN 210  TIBC 221*  IRON 18*  RETICCTPCT 1.3   Sepsis Labs: Recent Labs  Lab 06/21/24 1736  LATICACIDVEN 0.8   Recent Results (from the past 240 hours)  Resp panel by RT-PCR (RSV, Flu A&B, Covid) Anterior Nasal Swab     Status: None   Collection Time: 06/19/24  7:04 PM   Specimen: Anterior Nasal Swab  Result Value Ref Range Status   SARS Coronavirus 2 by RT PCR NEGATIVE NEGATIVE Final    Comment: (NOTE) SARS-CoV-2 target nucleic acids are NOT DETECTED.  The SARS-CoV-2 RNA is generally detectable in upper respiratory specimens during the acute phase of infection. The lowest concentration of SARS-CoV-2 viral copies this assay can detect is 138 copies/mL. A negative result does not preclude SARS-Cov-2 infection and should not be used as the sole basis for treatment or other patient management decisions. A negative result may occur with  improper specimen collection/handling, submission of specimen other than nasopharyngeal swab, presence of viral mutation(s) within the areas targeted by this assay, and inadequate number of viral copies(<138 copies/mL). A negative  result must be combined with clinical observations, patient history, and epidemiological information. The expected result is Negative.  Fact Sheet for Patients:  BloggerCourse.com  Fact Sheet for Healthcare Providers:  SeriousBroker.it  This test is no t yet approved or cleared by the [place]  FDA and  has been authorized for detection and/or diagnosis of SARS-CoV-2 by FDA under an Emergency Use Authorization (EUA). This EUA will remain  in effect (meaning this test can be used) for the duration of the COVID-19 declaration under Section 564(b)(1) of the Act, 21 U.S.C.section 360bbb-3(b)(1), unless the authorization is terminated  or revoked sooner.       Influenza A by PCR NEGATIVE NEGATIVE Final   Influenza B by PCR NEGATIVE NEGATIVE Final    Comment: (NOTE) The Xpert Xpress SARS-CoV-2/FLU/RSV plus assay is intended as an aid in the diagnosis of influenza from Nasopharyngeal swab specimens and should not be used as a sole basis for treatment. Nasal washings and aspirates are unacceptable for Xpert Xpress SARS-CoV-2/FLU/RSV testing.  Fact Sheet for Patients: BloggerCourse.com  Fact Sheet for Healthcare Providers: SeriousBroker.it  This test is not yet approved or cleared by the [place]  FDA and has been authorized for detection and/or diagnosis of SARS-CoV-2 by FDA under an Emergency Use Authorization (EUA). This EUA will remain in effect (meaning this test can be used) for the duration of the COVID-19 declaration under Section 564(b)(1) of the Act, 21 U.S.C. section 360bbb-3(b)(1), unless the authorization is terminated or revoked.     Resp Syncytial Virus by PCR NEGATIVE NEGATIVE Final    Comment: (NOTE) Fact Sheet for Patients: BloggerCourse.com  Fact Sheet for Healthcare Providers: SeriousBroker.it  This  test is not yet approved or cleared by the [place]  FDA and has been authorized for detection  and/or diagnosis of SARS-CoV-2 by FDA under an Emergency Use Authorization (EUA). This EUA will remain in effect (meaning this test can be used) for the duration of the COVID-19 declaration under Section 564(b)(1) of the Act, 21 U.S.C. section 360bbb-3(b)(1), unless the authorization is terminated or revoked.  Performed at [facility], [address], [place], [place] [address]   Culture, blood (routine x 2)     Status: Abnormal   Collection Time: 06/21/24  4:49 PM   Specimen: BLOOD RIGHT FOREARM  Result Value Ref Range Status   Specimen Description BLOOD RIGHT FOREARM  Final   Special Requests   Final    BOTTLES DRAWN AEROBIC AND ANAEROBIC Blood Culture adequate volume   Culture  Setup Time   Final    AEROBIC BOTTLE ONLY GRAM NEGATIVE RODS CRITICAL RESULT CALLED TO, READ BACK BY AND VERIFIED WITH: MAYA RANDALL CALL [phone] AT 0955 BY EC Performed at [facility], [address] [place], [place] [address]    Culture ESCHERICHIA COLI (A)  Final   Report Status 06/24/2024 FINAL  Final   Organism ID, Bacteria ESCHERICHIA COLI  Final   Organism ID, Bacteria ESCHERICHIA COLI  Final      Susceptibility   Escherichia coli - KIRBY BAUER*    CEFAZOLIN  SENSITIVE Sensitive    Escherichia coli - MIC*    AMPICILLIN 8 SENSITIVE Sensitive     CEFEPIME <=0.12 SENSITIVE Sensitive     CEFTAZIDIME <=1 SENSITIVE Sensitive     CEFTRIAXONE  <=0.25 SENSITIVE Sensitive     CIPROFLOXACIN <=0.25 SENSITIVE Sensitive     GENTAMICIN <=1 SENSITIVE Sensitive     IMIPENEM <=0.25 SENSITIVE Sensitive     TRIMETH/SULFA <=20 SENSITIVE Sensitive     AMPICILLIN/SULBACTAM 4 SENSITIVE Sensitive     PIP/TAZO <=4 SENSITIVE Sensitive ug/mL    * ESCHERICHIA COLI    ESCHERICHIA COLI  Blood Culture ID Panel (Reflexed)     Status: Abnormal   Collection Time: 06/21/24  4:49 PM  Result Value Ref  Range Status   Enterococcus faecalis NOT DETECTED NOT DETECTED Final   Enterococcus Faecium NOT DETECTED NOT DETECTED Final   Listeria monocytogenes NOT DETECTED NOT DETECTED Final   Staphylococcus species NOT DETECTED NOT DETECTED Final   Staphylococcus aureus (BCID) NOT DETECTED NOT DETECTED Final   Staphylococcus epidermidis NOT DETECTED NOT DETECTED Final   Staphylococcus lugdunensis NOT DETECTED NOT DETECTED Final   Streptococcus species NOT DETECTED NOT DETECTED Final   Streptococcus agalactiae NOT DETECTED NOT DETECTED Final   Streptococcus pneumoniae NOT DETECTED NOT DETECTED Final   Streptococcus pyogenes NOT DETECTED NOT DETECTED Final   A.calcoaceticus-baumannii NOT DETECTED NOT DETECTED Final   Bacteroides fragilis NOT DETECTED NOT DETECTED Final   Enterobacterales DETECTED (A) NOT DETECTED Final    Comment: Enterobacterales represent a large order of gram negative bacteria, not a single organism. CRITICAL RESULT CALLED TO, READ BACK BY AND VERIFIED WITH: PHARMD JENNY ZHOU [phone] AT 0955 BY EC    Enterobacter cloacae complex NOT DETECTED NOT DETECTED Final   Escherichia coli DETECTED (A) NOT DETECTED Final    Comment: CRITICAL RESULT CALLED TO, READ BACK BY AND VERIFIED WITH: PHARMD JENNY ZHOU [phone] AT 0955 BY EC    Klebsiella aerogenes NOT DETECTED NOT DETECTED Final   Klebsiella oxytoca NOT DETECTED NOT DETECTED Final   Klebsiella pneumoniae NOT DETECTED NOT DETECTED Final   Proteus species NOT DETECTED NOT DETECTED Final   Salmonella species NOT DETECTED NOT DETECTED Final  Serratia marcescens NOT DETECTED NOT DETECTED Final   Haemophilus influenzae NOT DETECTED NOT DETECTED Final   Neisseria meningitidis NOT DETECTED NOT DETECTED Final   Pseudomonas aeruginosa NOT DETECTED NOT DETECTED Final   Stenotrophomonas maltophilia NOT DETECTED NOT DETECTED Final   Candida albicans NOT DETECTED NOT DETECTED Final   Candida auris NOT DETECTED NOT DETECTED Final    Candida glabrata NOT DETECTED NOT DETECTED Final   Candida krusei NOT DETECTED NOT DETECTED Final   Candida parapsilosis NOT DETECTED NOT DETECTED Final   Candida tropicalis NOT DETECTED NOT DETECTED Final   Cryptococcus neoformans/gattii NOT DETECTED NOT DETECTED Final   CTX-M ESBL NOT DETECTED NOT DETECTED Final   Carbapenem resistance IMP NOT DETECTED NOT DETECTED Final   Carbapenem resistance KPC NOT DETECTED NOT DETECTED Final   Carbapenem resistance NDM NOT DETECTED NOT DETECTED Final   Carbapenem resist OXA 48 LIKE NOT DETECTED NOT DETECTED Final   Carbapenem resistance VIM NOT DETECTED NOT DETECTED Final    Comment: Performed at [facility], [address] [place], [place] [address]  Culture, blood (routine x 2)     Status: None (Preliminary result)   Collection Time: 06/21/24  4:54 PM   Specimen: BLOOD  Result Value Ref Range Status   Specimen Description BLOOD RIGHT ANTECUBITAL  Final   Special Requests   Final    BOTTLES DRAWN AEROBIC AND ANAEROBIC Blood Culture results may not be optimal due to an inadequate volume of blood received in culture bottles   Culture   Final    NO GROWTH 4 DAYS Performed at [facility], [address] [place], [place] [address]    Report Status PENDING  Incomplete    Radiology Studies: CT ANGIO HEAD NECK W WO CM (CODE STROKE) Result Date: 06/25/2024 CLINICAL DATA:  Neuro deficit, acute, stroke suspected EXAM: CT ANGIOGRAPHY HEAD AND NECK WITH AND WITHOUT CONTRAST TECHNIQUE: Multidetector CT imaging of the head and neck was performed using the standard protocol during bolus administration of intravenous contrast. Multiplanar CT image reconstructions and MIPs were obtained to evaluate the vascular anatomy. Carotid stenosis measurements (when applicable) are obtained utilizing NASCET criteria, using the distal internal carotid diameter as the denominator. RADIATION DOSE REDUCTION: This exam was performed according to the departmental  dose-optimization program which includes automated exposure control, adjustment of the mA and/or kV according to patient size and/or use of iterative reconstruction technique. CONTRAST:  75mL OMNIPAQUE  IOHEXOL  350 MG/ML SOLN COMPARISON:  CT of the head dated June 25, 2024. FINDINGS: CTA NECK FINDINGS Aortic arch: Mild to moderate calcific plaque within the aortic arch. The brachiocephalic artery peers widely patent. Right carotid system: The common carotid and internal carotid arteries are normal in caliber and demonstrates mild calcific atheromatous disease. The internal carotid artery is tortuous. Left carotid system: The common carotid artery is normal in caliber and demonstrates mild calcific atheromatous disease. There is mild calcific plaque present proximally within the internal carotid artery, but no luminal stenosis. Vertebral arteries: The right vertebral artery is dominant and the left is relatively hypoplastic. The V1 segment is tortuous. Skeleton: Multilevel degenerative disc disease throughout the cervical spine. No osseous lesions. Other neck: Negative. Upper chest: Mild-to-moderate central lobular emphysema. Review of the MIP images confirms the above findings CTA HEAD FINDINGS Anterior circulation: There is an abrupt cut off of the distal M1 segment of the right middle cerebral artery with limited filling of the M2 segments. The anterior cerebral arteries and the left middle  cerebral artery are normal in caliber and demonstrate no evidence of aneurysm or flow-limiting stenosis. There is mild calcific plaque within the carotid siphons, but no flow limiting stenosis. Posterior circulation: The vertebrobasilar system is unremarkable. The posterior cerebral arteries and the cerebellar arteries are patent. The right vertebral artery is dominant. Venous sinuses: Patent. Anatomic variants: None. Review of the MIP images confirms the above findings IMPRESSION: 1. Abrupt occlusion of the distal M1 segment of  the right middle cerebral artery. These results were called by telephone at the time of interpretation on 06/25/2024 at 7:21 pm to provider Dr. LOLA JERNIGAN , who verbally acknowledged these results. Electronically Signed   By: Evalene Coho M.D.   On: 06/25/2024 19:32   CT HEAD CODE STROKE WO CONTRAST Result Date: 06/25/2024 CLINICAL DATA:  Code stroke.  Neuro deficit, concern for stroke. EXAM: CT HEAD WITHOUT CONTRAST TECHNIQUE: Contiguous axial images were obtained from the base of the skull through the vertex without intravenous contrast. RADIATION DOSE REDUCTION: This exam was performed according to the departmental dose-optimization program which includes automated exposure control, adjustment of the mA and/or kV according to patient size and/or use of iterative reconstruction technique. COMPARISON:  MRI head 05/06/2022. FINDINGS: Brain: No acute intracranial hemorrhage. No CT evidence of acute infarct. Nonspecific hypoattenuation in the periventricular and subcortical white matter favored to reflect chronic microvascular ischemic changes. no edema, mass effect, or midline shift. The basilar cisterns are patent. Ventricles: The ventricles are normal. Vascular: No hyperdense vessel or unexpected calcification. Skull: No acute or aggressive finding. Orbits: Bilateral lens replacement. Sinuses: The visualized paranasal sinuses are clear. Other: Mastoid air cells are clear. ASPECTS [facility] Stroke Program Early CT Score) - Ganglionic level infarction (caudate, lentiform nuclei, internal capsule, insula, M1-M3 cortex): 7 - Supraganglionic infarction (M4-M6 cortex): 3 Total score (0-10 with 10 being normal): 10 IMPRESSION: 1. No CT evidence of acute intracranial abnormality. 2. ASPECTS is 10 These results were communicated to Dr. JERNIGAN at 6:58 pm on 06/25/2024 by text page via the [facility] messaging system. Electronically Signed   By: Donnice Mania M.D.   On: 06/25/2024 18:58   ECHOCARDIOGRAM COMPLETE Result  Date: 06/25/2024    ECHOCARDIOGRAM REPORT   Patient Name:   LOWELLA KINDLEY Date of Exam: 06/25/2024 Medical Rec #:  [id number]       Height:       61.0 in Accession #:    [id number]      Weight:       149.7 lb Date of Birth:  11/18/1932       BSA:          1.670 m Patient Age:    89 years        BP:           118/47 mmHg Patient Gender: F               HR:           71 bpm. Exam Location:  Inpatient Procedure: 2D Echo, Cardiac Doppler and Color Doppler (Both Spectral and Color            Flow Doppler were utilized during procedure). Indications:    CHF-Acute Diastolic I50.31  History:        Patient has prior history of Echocardiogram examinations, most                 recent 06/09/2022. COPD; Risk Factors:Dyslipidemia and Current  Smoker. Breast cancer (R) breast.  Sonographer:    Thea Norlander RCS Referring Phys: [id number] VASUNDHRA RATHORE  Sonographer Comments: Image acquisition challenging due to patient body habitus. IMPRESSIONS  1. Left ventricular ejection fraction, by estimation, is 60 to 65%. The left ventricle has normal function. The left ventricle has no regional wall motion abnormalities. Left ventricular diastolic parameters are consistent with Grade II diastolic dysfunction (pseudonormalization).  2. Right ventricular systolic function is normal. The right ventricular size is normal.  3. Left atrial size was mildly dilated.  4. The mitral valve is normal in structure. No evidence of mitral valve regurgitation. No evidence of mitral stenosis.  5. The aortic valve is tricuspid. Aortic valve regurgitation is not visualized. No aortic stenosis is present.  6. The inferior vena cava is normal in size with greater than 50% respiratory variability, suggesting right atrial pressure of 3 mmHg. FINDINGS  Left Ventricle: Left ventricular ejection fraction, by estimation, is 60 to 65%. The left ventricle has normal function. The left ventricle has no regional wall motion abnormalities. The left  ventricular internal cavity size was normal in size. There is  no left ventricular hypertrophy. Left ventricular diastolic parameters are consistent with Grade II diastolic dysfunction (pseudonormalization). Right Ventricle: The right ventricular size is normal. Right ventricular systolic function is normal. Left Atrium: Left atrial size was mildly dilated. Right Atrium: Right atrial size was normal in size. Pericardium: There is no evidence of pericardial effusion. Mitral Valve: The mitral valve is normal in structure. Mild mitral annular calcification. No evidence of mitral valve regurgitation. No evidence of mitral valve stenosis. Tricuspid Valve: The tricuspid valve is normal in structure. Tricuspid valve regurgitation is trivial. No evidence of tricuspid stenosis. Aortic Valve: The aortic valve is tricuspid. Aortic valve regurgitation is not visualized. No aortic stenosis is present. Aortic valve peak gradient measures 3.2 mmHg. Pulmonic Valve: The pulmonic valve was normal in structure. Pulmonic valve regurgitation is mild. No evidence of pulmonic stenosis. Aorta: The aortic root is normal in size and structure. Venous: The inferior vena cava is normal in size with greater than 50% respiratory variability, suggesting right atrial pressure of 3 mmHg. IAS/Shunts: No atrial level shunt detected by color flow Doppler.  LEFT VENTRICLE PLAX 2D LVIDd:         3.70 cm   Diastology LVIDs:         2.40 cm   LV e' medial:    5.87 cm/s LV PW:         1.10 cm   LV E/e' medial:  12.9 LV IVS:        0.90 cm   LV e' lateral:   7.83 cm/s LVOT diam:     2.10 cm   LV E/e' lateral: 9.6 LV SV:         46 LV SV Index:   28 LVOT Area:     3.46 cm  RIGHT VENTRICLE            IVC RV S prime:     7.54 cm/s  IVC diam: 1.40 cm LEFT ATRIUM           Index        RIGHT ATRIUM           Index LA diam:      3.10 cm 1.86 cm/m   RA Area:     13.20 cm LA Vol (A4C): 43.4 ml 25.96 ml/m  RA Volume:   25.90 ml  15.51 ml/m  AORTIC  VALVE AV Area  (Vmax): 2.63 cm AV Vmax:        88.90 cm/s AV Peak Grad:   3.2 mmHg LVOT Vmax:      67.60 cm/s LVOT Vmean:     40.400 cm/s LVOT VTI:       0.133 m  AORTA Ao Root diam: 3.60 cm Ao Asc diam:  3.30 cm MITRAL VALVE MV Area (PHT): 3.37 cm    SHUNTS MV Decel Time: 225 msec    Systemic VTI:  0.13 m MV E velocity: 75.50 cm/s  Systemic Diam: 2.10 cm MV A velocity: 61.30 cm/s MV E/A ratio:  1.23 Redell Shallow MD Electronically signed by Redell Shallow MD Signature Date/Time: 06/25/2024/4:59:34 PM    Final    NM Hepatobiliary Liver Func Result Date: 06/24/2024 CLINICAL DATA:  Cholecystitis. EXAM: NUCLEAR MEDICINE HEPATOBILIARY IMAGING TECHNIQUE: Sequential images of the abdomen were obtained out to 60 minutes following intravenous administration of radiopharmaceutical. RADIOPHARMACEUTICALS:  5 mCi Tc-70m  Choletec  IV 2.7 mg morphine  also administered IV. COMPARISON:  MRI 06/22/2024 FINDINGS: Satisfactory uptake of radiopharmaceutical from the blood pool. Biliary activity visible at 8 minutes. Bowel activity visible at 14 minutes. Initial non filling of the gallbladder over 60 minutes. We administered the 2.7 mg of morphine  IV to cause contraction of the sphincter of OD. Subsequently the gallbladder opacifies, first becoming visible at 19 minutes post morphine  administration and subsequently demonstrating increasing activity until the 30 minute mark. Accordingly the cystic duct and common bile duct are both patent. IMPRESSION: 1. Patent cystic duct, accordingly no supportive evidence for acute cholecystitis. 2. Patent common bile duct 3. Overall normal exam. Electronically Signed   By: Ryan Salvage M.D.   On: 06/24/2024 13:57   Scheduled Meds:  [START ON 06/26/2024]  stroke: early stages of recovery book   Does not apply Once   buPROPion   150 mg Oral Daily   escitalopram   40 mg Oral Daily   pantoprazole  (PROTONIX ) IV  40 mg Intravenous QHS   sodium chloride  flush  3 mL Intravenous Q12H   umeclidinium bromide   1  puff Inhalation Daily   Continuous Infusions:  sodium chloride      cefTRIAXone  (ROCEPHIN )  IV 2 g (06/25/24 1735)    LOS: 4 days   The patient is critically ill with multiple organ system failure and requires high complexity decision making for assessment and support, frequent evaluation and titration of therapies and application of advanced monitoring technologies and extensive interpretation of multiple databases  CRITICAL CARE TIME: 39 nonconsecutive minutes devoted to patient care services described in this note including but not limited to reviewing the chart, evaluating the patient, coordinating care, updating patient's family as well as discussing with appropriate consultants.  Alejandro Marker, DO [facility] Available via Epic secure chat 7am-7pm After these hours, please refer to coverage provider listed on [email or web address] 06/25/2024, 8:32 PM

## 2024-06-25 NOTE — Progress Notes (Signed)
 eLink Physician-Brief Progress Note Patient Name: [name] DOB: Aug 02, 1932 MRN: [id number]   Date of Service  06/25/2024  HPI/Events of Note  Patient originally admitted for acute cholecystitis but developed acute CVA and had right M 1 MCA thrombectomy following which she is admitted to the neuro ICU intubated and mechanically ventilated.  eICU Interventions  New Patient Evaluation.        [name] [name] 06/25/2024, 11:02 PM

## 2024-06-25 NOTE — Anesthesia Procedure Notes (Signed)
 Procedure Name: Intubation Date/Time: 06/25/2024 7:38 PM  Performed by: Lockie Flesher, CRNAPre-anesthesia Checklist: Patient identified, Emergency Drugs available, Suction available and Patient being monitored Patient Re-evaluated:Patient Re-evaluated prior to induction Oxygen Delivery Method: Circle System Utilized Preoxygenation: Pre-oxygenation with 100% oxygen Induction Type: IV induction Ventilation: Mask ventilation without difficulty Laryngoscope Size: Mac and 3 Grade View: Grade I Tube type: Oral Tube size: 7.0 mm Number of attempts: 1 Airway Equipment and Method: Stylet and Oral airway Placement Confirmation: ETT inserted through vocal cords under direct vision, positive ETCO2 and breath sounds checked- equal and bilateral Secured at: 21 cm Tube secured with: Tape Dental Injury: Teeth and Oropharynx as per pre-operative assessment  Comments: Lip was red/ dry blood on three spots prior to touching the mouth.

## 2024-06-26 ENCOUNTER — Inpatient Hospital Stay (HOSPITAL_COMMUNITY)

## 2024-06-26 ENCOUNTER — Encounter (HOSPITAL_COMMUNITY): Payer: Self-pay | Admitting: Interventional Radiology

## 2024-06-26 DIAGNOSIS — A419 Sepsis, unspecified organism: Secondary | ICD-10-CM

## 2024-06-26 DIAGNOSIS — I6601 Occlusion and stenosis of right middle cerebral artery: Secondary | ICD-10-CM | POA: Diagnosis not present

## 2024-06-26 DIAGNOSIS — I609 Nontraumatic subarachnoid hemorrhage, unspecified: Secondary | ICD-10-CM

## 2024-06-26 DIAGNOSIS — I69391 Dysphagia following cerebral infarction: Secondary | ICD-10-CM | POA: Diagnosis not present

## 2024-06-26 DIAGNOSIS — B9620 Unspecified Escherichia coli [E. coli] as the cause of diseases classified elsewhere: Secondary | ICD-10-CM | POA: Diagnosis not present

## 2024-06-26 DIAGNOSIS — K810 Acute cholecystitis: Secondary | ICD-10-CM

## 2024-06-26 DIAGNOSIS — J449 Chronic obstructive pulmonary disease, unspecified: Secondary | ICD-10-CM | POA: Diagnosis not present

## 2024-06-26 DIAGNOSIS — I779 Disorder of arteries and arterioles, unspecified: Secondary | ICD-10-CM | POA: Diagnosis not present

## 2024-06-26 DIAGNOSIS — R7881 Bacteremia: Secondary | ICD-10-CM | POA: Diagnosis not present

## 2024-06-26 DIAGNOSIS — E440 Moderate protein-calorie malnutrition: Secondary | ICD-10-CM | POA: Insufficient documentation

## 2024-06-26 DIAGNOSIS — I63511 Cerebral infarction due to unspecified occlusion or stenosis of right middle cerebral artery: Secondary | ICD-10-CM

## 2024-06-26 LAB — HEMOGLOBIN A1C
Hgb A1c MFr Bld: 5.1 % (ref 4.8–5.6)
Mean Plasma Glucose: 100 mg/dL

## 2024-06-26 LAB — COMPREHENSIVE METABOLIC PANEL WITH GFR
ALT: 57 U/L — ABNORMAL HIGH (ref 0–44)
AST: 25 U/L (ref 15–41)
Albumin: 2.5 g/dL — ABNORMAL LOW (ref 3.5–5.0)
Alkaline Phosphatase: 58 U/L (ref 38–126)
Anion gap: 11 (ref 5–15)
BUN: 7 mg/dL — ABNORMAL LOW (ref 8–23)
CO2: 23 mmol/L (ref 22–32)
Calcium: 7.8 mg/dL — ABNORMAL LOW (ref 8.9–10.3)
Chloride: 102 mmol/L (ref 98–111)
Creatinine, Ser: 0.62 mg/dL (ref 0.44–1.00)
GFR, Estimated: >60 mL/min (ref >60)
Glucose, Bld: 122 mg/dL — ABNORMAL HIGH (ref 70–99)
Potassium: 4 mmol/L (ref 3.5–5.1)
Sodium: 136 mmol/L (ref 135–145)
Total Bilirubin: 0.5 mg/dL (ref 0.0–1.2)
Total Protein: 5.3 g/dL — ABNORMAL LOW (ref 6.5–8.1)

## 2024-06-26 LAB — CBC WITH DIFFERENTIAL/PLATELET
Abs Immature Granulocytes: 0.25 K/uL — ABNORMAL HIGH (ref 0.00–0.07)
Basophils Absolute: 0.1 K/uL (ref 0.0–0.1)
Basophils Relative: 1 %
Eosinophils Absolute: 0.2 K/uL (ref 0.0–0.5)
Eosinophils Relative: 2 %
HCT: 31.8 % — ABNORMAL LOW (ref 36.0–46.0)
Hemoglobin: 10.6 g/dL — ABNORMAL LOW (ref 12.0–15.0)
Immature Granulocytes: 3 %
Lymphocytes Relative: 9 %
Lymphs Abs: 0.8 K/uL (ref 0.7–4.0)
MCH: 31.5 pg (ref 26.0–34.0)
MCHC: 33.3 g/dL (ref 30.0–36.0)
MCV: 94.6 fL (ref 80.0–100.0)
Monocytes Absolute: 0.9 K/uL (ref 0.1–1.0)
Monocytes Relative: 10 %
Neutro Abs: 7 K/uL (ref 1.7–7.7)
Neutrophils Relative %: 75 %
Platelets: 176 K/uL (ref 150–400)
RBC: 3.36 MIL/uL — ABNORMAL LOW (ref 3.87–5.11)
RDW: 13.5 % (ref 11.5–15.5)
WBC: 9.2 K/uL (ref 4.0–10.5)
nRBC: 0 % (ref 0.0–0.2)

## 2024-06-26 LAB — GASTROINTESTINAL PANEL BY PCR, STOOL (REPLACES STOOL CULTURE)

## 2024-06-26 LAB — LIPID PANEL
Cholesterol: 96 mg/dL (ref 0–200)
HDL: 35 mg/dL — ABNORMAL LOW (ref >40)
LDL Cholesterol: 46 mg/dL (ref 0–99)
Total CHOL/HDL Ratio: 2.7 ratio
Triglycerides: 74 mg/dL (ref <150)
VLDL: 15 mg/dL (ref 0–40)

## 2024-06-26 LAB — CULTURE, BLOOD (ROUTINE X 2): Culture: NO GROWTH

## 2024-06-26 LAB — BLOOD GAS, ARTERIAL
Acid-base deficit: 0.2 mmol/L (ref 0.0–2.0)
Bicarbonate: 24.8 mmol/L (ref 20.0–28.0)
O2 Saturation: 100 %
Patient temperature: 37.1
pCO2 arterial: 41 mmHg (ref 32–48)
pH, Arterial: 7.39 (ref 7.35–7.45)
pO2, Arterial: 119 mmHg — ABNORMAL HIGH (ref 83–108)

## 2024-06-26 LAB — PHOSPHORUS: Phosphorus: 4.2 mg/dL (ref 2.5–4.6)

## 2024-06-26 LAB — MRSA NEXT GEN BY PCR, NASAL: MRSA by PCR Next Gen: NOT DETECTED

## 2024-06-26 LAB — MAGNESIUM: Magnesium: 1.6 mg/dL — ABNORMAL LOW (ref 1.7–2.4)

## 2024-06-26 LAB — TRIGLYCERIDES: Triglycerides: 74 mg/dL (ref <150)

## 2024-06-26 MED ORDER — REVEFENACIN 175 MCG/3ML IN SOLN
175.0000 ug | Freq: Every day | RESPIRATORY_TRACT | Status: DC
  Administered 2024-06-26 – 2024-07-08 (×13): 175 ug via RESPIRATORY_TRACT
  Filled 2024-06-26 (×12): qty 3

## 2024-06-26 MED ORDER — ORAL CARE MOUTH RINSE
15.0000 mL | OROMUCOSAL | Status: DC
  Administered 2024-06-26 – 2024-07-08 (×47): 15 mL via OROMUCOSAL

## 2024-06-26 MED ORDER — HYDRALAZINE HCL 20 MG/ML IJ SOLN: 5.0000 mg | INTRAMUSCULAR | Status: DC | PRN

## 2024-06-26 MED ORDER — MELATONIN 3 MG PO TABS
6.0000 mg | ORAL_TABLET | Freq: Every evening | ORAL | Status: DC | PRN
  Administered 2024-07-07: 6 mg
  Filled 2024-06-26: qty 2

## 2024-06-26 MED ORDER — HYDRALAZINE HCL 20 MG/ML IJ SOLN: 10.0000 mg | INTRAMUSCULAR | Status: DC | PRN

## 2024-06-26 MED ORDER — MAGNESIUM SULFATE 4 GM/100ML IV SOLN
4.0000 g | Freq: Once | INTRAVENOUS | Status: AC
  Administered 2024-06-26: 4 g via INTRAVENOUS
  Filled 2024-06-26: qty 100

## 2024-06-26 MED ORDER — WHITE PETROLATUM EX OINT
TOPICAL_OINTMENT | CUTANEOUS | Status: DC | PRN
  Filled 2024-06-26: qty 28.35

## 2024-06-26 MED ORDER — ORAL CARE MOUTH RINSE: 15.0000 mL | OROMUCOSAL | Status: DC | PRN

## 2024-06-26 MED ORDER — ARFORMOTEROL TARTRATE 15 MCG/2ML IN NEBU
15.0000 ug | INHALATION_SOLUTION | Freq: Two times a day (BID) | RESPIRATORY_TRACT | Status: DC
  Administered 2024-06-26 – 2024-06-30 (×8): 15 ug via RESPIRATORY_TRACT
  Filled 2024-06-26 (×8): qty 2

## 2024-06-26 MED ORDER — LABETALOL HCL 5 MG/ML IV SOLN: 5.0000 mg | INTRAVENOUS | Status: DC | PRN

## 2024-06-26 MED ORDER — CHLORHEXIDINE GLUCONATE CLOTH 2 % EX PADS
6.0000 | MEDICATED_PAD | Freq: Every day | CUTANEOUS | Status: DC
  Administered 2024-06-26 – 2024-07-08 (×11): 6 via TOPICAL

## 2024-06-26 MED ORDER — POLYETHYLENE GLYCOL 3350 17 G PO PACK
17.0000 g | PACK | Freq: Every day | ORAL | Status: DC | PRN
  Administered 2024-06-29: 17 g
  Filled 2024-06-26: qty 1

## 2024-06-26 MED ORDER — ESCITALOPRAM OXALATE 10 MG PO TABS
40.0000 mg | ORAL_TABLET | Freq: Every day | ORAL | Status: DC
  Administered 2024-06-26 – 2024-07-08 (×13): 40 mg
  Filled 2024-06-26 (×13): qty 4

## 2024-06-26 NOTE — Evaluation (Signed)
 Physical Therapy Re-evaluation Patient Details Name: [name] MRN: [id number] DOB: Feb 11, 1932 Today's Date: 06/26/2024  History of Present Illness  Pt is an 89 y.o. female presenting 7/5 with RUQ abdominal pain radiating to back, nausea. Found to have suspected cholecystitis, hyponatremia, bacteriuria, hematuria. [facility] ED visit 7/3 Lab work demonstrates acute liver injury but pt leaving without being seen. 7/09 R MCA occlusion; received TNK; to IR with unsuccessful revascularization (repeated reocclusion); remained intubated until 7/10;  PMH: COPD, PAD, HLD, OSA, breast cancer.  Clinical Impression   Pt admitted secondary to problem above with deficits below. PTA patient lived alone with a personal aide helping her 5 days/week. She was ambulatory with RW inside her home. Pt currently demonstrates Lt hemiparesis and is dependent for all mobility. She was able to follow one step commands with incr time, including some movements with Lt extremities. She was very fatigued and refused to participate in a mobility assessment, however based on her deficits and her prior functional status, anticipate she will need post-acute inpatient therapies <3 hrs/day. Anticipate patient will benefit from PT to address problems listed below. Will continue to follow acutely to maximize functional mobility, independence, and safety.           If plan is discharge home, recommend the following: Assist for transportation;Supervision due to cognitive status;Two people to help with walking and/or transfers;Assistance with cooking/housework;Assistance with feeding;Direct supervision/assist for medications management;Direct supervision/assist for financial management;Help with stairs or ramp for entrance (initially pt to benefit from 24/7 assist from prolonged hospital stay)   Can travel by private vehicle   No    Equipment Recommendations Wheelchair (measurements PT);Wheelchair cushion (measurements PT);Hoyer lift (has  RW)  Recommendations for Other Services       Functional Status Assessment Patient has had a recent decline in their functional status and demonstrates the ability to make significant improvements in function in a reasonable and predictable amount of time.     Precautions / Restrictions Precautions Precautions: Fall Restrictions Weight Bearing Restrictions Per Provider Order: No      Mobility  Bed Mobility               General bed mobility comments: pt refused all mobility after ROM/strength assessment; I'm exhausted. I think you will give me a heart attack if I do anything else.    Transfers                        Ambulation/Gait                  Stairs            Wheelchair Mobility     Tilt Bed    Modified Rankin (Stroke Patients Only) Modified Rankin (Stroke Patients Only) Pre-Morbid Rankin Score: Moderate disability Modified Rankin: Severe disability     Balance                                             Pertinent Vitals/Pain Pain Assessment Pain Assessment: Faces Faces Pain Scale: No hurt    Home Living Family/patient expects to be discharged to:: Private residence Living Arrangements: Alone Available Help at Discharge: Family;Personal care attendant (5 days/week 5 hours/day 10-3) Type of Home: Other(Comment) (condo) Home Access: Level entry       Home Layout: One level Home Equipment: Agricultural consultant (2 wheels);Rollator (  4 wheels);Cane - single point;Shower seat - built in;Grab bars - tub/shower;Hand held shower head;Grab bars - toilet;Transport chair      Prior Function Prior Level of Function : Independent/Modified Independent;Needs assist             Mobility Comments: use of RW in the home ADLs Comments: PCA prepared breakfaast/lunch, laundry, set-up for medication management, grocery shopping, supervises showers, drives to appts. Pt otherwise mod I in ADL     Extremity/Trunk  Assessment   Upper Extremity Assessment Upper Extremity Assessment: Defer to OT evaluation (no active hand movement; AAROM for elbow flexion; PROM elbow extension and shoulder)    Lower Extremity Assessment Lower Extremity Assessment: RLE deficits/detail;LLE deficits/detail RLE Deficits / Details: moving against gravity; grossly 3+ to 4 throughout LLE Deficits / Details: able to wiggle toes, AAROM knee flexion and extension LLE Sensation: decreased light touch    Cervical / Trunk Assessment Cervical / Trunk Assessment: Other exceptions Cervical / Trunk Exceptions: in supine, tends to lean to her left with head rotated to the Rt  Communication   Communication Communication: Impaired Factors Affecting Communication: Reduced clarity of speech    Cognition Arousal: Lethargic Behavior During Therapy: Flat affect                             Following commands: Impaired Following commands impaired: Follows one step commands with increased time     Cueing Cueing Techniques: Verbal cues, Tactile cues     General Comments General comments (skin integrity, edema, etc.): BP 135/68 with ROM    Exercises Other Exercises Other Exercises: PROM, AAROM or AROM to 4 extremities   Assessment/Plan    PT Assessment Patient needs continued PT services  PT Problem List Decreased strength;Decreased activity tolerance;Decreased balance;Decreased mobility;Decreased coordination;Decreased knowledge of use of DME;Decreased safety awareness;Impaired sensation;Impaired tone       PT Treatment Interventions DME instruction;Gait training;Functional mobility training;Therapeutic activities;Therapeutic exercise;Balance training;Neuromuscular re-education;Cognitive remediation;Patient/family education;Wheelchair mobility training    PT Goals (Current goals can be found in the Care Plan section)  Acute Rehab PT Goals Patient Stated Goal: rest PT Goal Formulation: With patient/family Time  For Goal Achievement: 07/10/24 Potential to Achieve Goals: Good    Frequency Min 2X/week     Co-evaluation               AM-PAC PT 6 Clicks Mobility  Outcome Measure Help needed turning from your back to your side while in a flat bed without using bedrails?: Total Help needed moving from lying on your back to sitting on the side of a flat bed without using bedrails?: Total Help needed moving to and from a bed to a chair (including a wheelchair)?: Total Help needed standing up from a chair using your arms (e.g., wheelchair or bedside chair)?: Total Help needed to walk in hospital room?: Total Help needed climbing 3-5 steps with a railing? : Total 6 Click Score: 6    End of Session   Activity Tolerance: Patient limited by fatigue Patient left: with call bell/phone within reach;with family/visitor present;in bed (MD present)   PT Visit Diagnosis: Unsteadiness on feet (R26.81);Hemiplegia and hemiparesis    Time: 8492-8472 PT Time Calculation (min) (ACUTE ONLY): 20 min   Charges:   PT Evaluation $PT Re-evaluation: 1 Re-eval   PT General Charges $$ ACUTE PT VISIT: 1 Visit          Macario RAMAN, PT Acute Rehabilitation Services  Office [phone]  Macario SQUIBB Chanelle Hodsdon 06/26/2024, 3:48 PM

## 2024-06-26 NOTE — Progress Notes (Signed)
 Supervising Physician: Dolphus Carrion  Patient Status:  [facility] - In-pt  Chief Complaint:  L sided weakness, R gaze deviation, slurred speech  Brief Hospital Course: Patient admitted for cholecystitis who developed L sided weakness, R gaze deviation, slurred speech abruptly at 1815 06/25/24 while her daughter was visiting her. A code stroke was called. She is today s/p right common carotid arteriogram with intervention Ateriogram positive for occluded dominant superior division of the right middle cerebral artery extending into the distal right M1 segment. Per Dr. Jammie postprocedural note: Status post revascularization of the distal right M1 segment and the proximal aspect of the superior division with 3 passes of a contact aspiration, and 2 passes with a 4 mm x 40 mm solitaire X retriever device and contact aspiration resulting in repeated  reocclusion secondary to underlying long segment atherosclerotic plaque.   Final revascularization/TICI 2B   Post CT brain no gross mass effect.  Small focal right perisylvian subarachnoid hemorrhage. Manual compression with quick clot applied  at the right groin puncture site for  25 minutes for hemostasis..  Distal pulses all intact unchanged from prior to procedure.   Use of a rescue stent was reviewed with the neurohospitalist.  It was decided not to do so due to the increased risk of intracranial hemorrhage related  to the use of dual antiplatelets, and TNK combination ,and occlusion of the stent given the small caliber of the vessel.   Patient was left intubated to protect her airway.  Subjective:  Today, the patient remains intubated with safety mitt on to protect lines and tubes. Currently under enteric precautions for C. Diff concerns.   Allergies: Adhesive [tape] and Latex  Medications: Prior to Admission medications   Medication Sig Start Date End Date Taking? Authorizing Provider  acetaminophen  (TYLENOL ) 650 MG CR tablet  Take 650 mg by mouth 3 (three) times daily as needed for pain.   Yes [provider]  albuterol  (PROVENTIL  HFA;VENTOLIN  HFA) 108 (90 Base) MCG/ACT inhaler Inhale 2 puffs into the lungs every 6 (six) hours as needed for wheezing or shortness of breath.   Yes [provider]  aspirin  EC 81 MG tablet Take 81 mg by mouth daily.   Yes [provider]  atorvastatin  (LIPITOR) 10 MG tablet Take 10 mg by mouth at bedtime.   Yes [provider]  Cholecalciferol (VITAMIN D) 2000 units tablet Take 4,000 Units by mouth daily.    Yes [provider]  escitalopram  (LEXAPRO ) 20 MG tablet Take 40 mg by mouth daily. 08/01/17  Yes [provider]  Multiple Vitamin (MULTIVITAMIN WITH MINERALS) TABS tablet Take 1 tablet by mouth daily.   Yes [provider]  pantoprazole  (PROTONIX ) 40 MG tablet Take 40 mg by mouth daily.   Yes [provider]  Polyethyl Glycol-Propyl Glycol (SYSTANE) 0.4-0.3 % SOLN Place 1 drop into both eyes daily as needed (dry eyes).   Yes [provider]  tiotropium (SPIRIVA) 18 MCG inhalation capsule Place 18 mcg into inhaler and inhale daily.   Yes [provider]  WELLBUTRIN  XL 150 MG 24 hr tablet 1 tablet in the morning Orally Once a day   Yes [provider]     Vital Signs: BP (!) 120/47   Pulse (!) 59   Temp (!) 97.4 F (36.3 C) (Axillary)   Resp 10   Ht 5' 1 (1.549 m)   Wt 149 lb 11.1 oz (67.9 kg)   SpO2 99%   BMI  28.28 kg/m   Physical Exam Awakens to verbal stimuli, responds appropriately to commands and to her name. Remains intubated R gaze deviation No facial asymmetry. R sided motor function intact. No movement LUE. Minimal movement LLE.  Common femoral artery puncture site looks good, no bleeding, no hematoma, no pseudoaneurysm   Imaging: Portable Chest x-ray Result Date: 06/25/2024 CLINICAL DATA:  Intubated EXAM: PORTABLE CHEST 1 VIEW COMPARISON:  06/23/2024, 03/16/2020  FINDINGS: Endotracheal tube tip is about 2.4 cm superior to the carina. Enteric tube tip below the diaphragm but incompletely assessed. Minimal patchy atelectasis or scarring left base. Stable cardiomediastinal silhouette with aortic atherosclerosis. No pneumothorax IMPRESSION: Endotracheal tube tip about 2.4 cm superior to the carina. Minimal patchy atelectasis or scarring at the left base. Electronically Signed   By: Luke Bun M.D.   On: 06/25/2024 23:38   DG Abd 1 View Result Date: 06/25/2024 CLINICAL DATA:  Feeding tube placement EXAM: ABDOMEN - 1 VIEW COMPARISON:  03/20/2024 FINDINGS: Enteric tube tip and side port overlie the proximal stomach. Aortic atherosclerosis. Contrast within the renal collecting systems. IMPRESSION: Enteric tube tip and side port overlie the proximal stomach. Electronically Signed   By: Luke Bun M.D.   On: 06/25/2024 23:37   CT ANGIO HEAD NECK W WO CM (CODE STROKE) Result Date: 06/25/2024 CLINICAL DATA:  Neuro deficit, acute, stroke suspected EXAM: CT ANGIOGRAPHY HEAD AND NECK WITH AND WITHOUT CONTRAST TECHNIQUE: Multidetector CT imaging of the head and neck was performed using the standard protocol during bolus administration of intravenous contrast. Multiplanar CT image reconstructions and MIPs were obtained to evaluate the vascular anatomy. Carotid stenosis measurements (when applicable) are obtained utilizing NASCET criteria, using the distal internal carotid diameter as the denominator. RADIATION DOSE REDUCTION: This exam was performed according to the departmental dose-optimization program which includes automated exposure control, adjustment of the mA and/or kV according to patient size and/or use of iterative reconstruction technique. CONTRAST:  75mL OMNIPAQUE  IOHEXOL  350 MG/ML SOLN COMPARISON:  CT of the head dated June 25, 2024. FINDINGS: CTA NECK FINDINGS Aortic arch: Mild to moderate calcific plaque within the aortic arch. The brachiocephalic artery peers widely  patent. Right carotid system: The common carotid and internal carotid arteries are normal in caliber and demonstrates mild calcific atheromatous disease. The internal carotid artery is tortuous. Left carotid system: The common carotid artery is normal in caliber and demonstrates mild calcific atheromatous disease. There is mild calcific plaque present proximally within the internal carotid artery, but no luminal stenosis. Vertebral arteries: The right vertebral artery is dominant and the left is relatively hypoplastic. The V1 segment is tortuous. Skeleton: Multilevel degenerative disc disease throughout the cervical spine. No osseous lesions. Other neck: Negative. Upper chest: Mild-to-moderate central lobular emphysema. Review of the MIP images confirms the above findings CTA HEAD FINDINGS Anterior circulation: There is an abrupt cut off of the distal M1 segment of the right middle cerebral artery with limited filling of the M2 segments. The anterior cerebral arteries and the left middle cerebral artery are normal in caliber and demonstrate no evidence of aneurysm or flow-limiting stenosis. There is mild calcific plaque within the carotid siphons, but no flow limiting stenosis. Posterior circulation: The vertebrobasilar system is unremarkable. The posterior cerebral arteries and the cerebellar arteries are patent. The right vertebral artery is dominant. Venous sinuses: Patent. Anatomic variants: None. Review of the MIP images confirms the above findings IMPRESSION: 1. Abrupt occlusion of the distal M1 segment of the right middle cerebral artery. These results were  called by telephone at the time of interpretation on 06/25/2024 at 7:21 pm to provider Dr. LOLA JERNIGAN , who verbally acknowledged these results. Electronically Signed   By: Evalene Coho M.D.   On: 06/25/2024 19:32   CT HEAD CODE STROKE WO CONTRAST Result Date: 06/25/2024 CLINICAL DATA:  Code stroke.  Neuro deficit, concern for stroke. EXAM: CT  HEAD WITHOUT CONTRAST TECHNIQUE: Contiguous axial images were obtained from the base of the skull through the vertex without intravenous contrast. RADIATION DOSE REDUCTION: This exam was performed according to the departmental dose-optimization program which includes automated exposure control, adjustment of the mA and/or kV according to patient size and/or use of iterative reconstruction technique. COMPARISON:  MRI head 05/06/2022. FINDINGS: Brain: No acute intracranial hemorrhage. No CT evidence of acute infarct. Nonspecific hypoattenuation in the periventricular and subcortical white matter favored to reflect chronic microvascular ischemic changes. no edema, mass effect, or midline shift. The basilar cisterns are patent. Ventricles: The ventricles are normal. Vascular: No hyperdense vessel or unexpected calcification. Skull: No acute or aggressive finding. Orbits: Bilateral lens replacement. Sinuses: The visualized paranasal sinuses are clear. Other: Mastoid air cells are clear. ASPECTS [facility] Stroke Program Early CT Score) - Ganglionic level infarction (caudate, lentiform nuclei, internal capsule, insula, M1-M3 cortex): 7 - Supraganglionic infarction (M4-M6 cortex): 3 Total score (0-10 with 10 being normal): 10 IMPRESSION: 1. No CT evidence of acute intracranial abnormality. 2. ASPECTS is 10 These results were communicated to Dr. JERNIGAN at 6:58 pm on 06/25/2024 by text page via the [facility] messaging system. Electronically Signed   By: Donnice Mania M.D.   On: 06/25/2024 18:58   ECHOCARDIOGRAM COMPLETE Result Date: 06/25/2024    ECHOCARDIOGRAM REPORT   Patient Name:   [name] Date of Exam: 06/25/2024 Medical Rec #:  [id number]       Height:       61.0 in Accession #:    [id number]      Weight:       149.7 lb Date of Birth:  1932-11-17       BSA:          1.670 m Patient Age:    89 years        BP:           118/47 mmHg Patient Gender: F               HR:           71 bpm. Exam Location:  Inpatient Procedure:  2D Echo, Cardiac Doppler and Color Doppler (Both Spectral and Color            Flow Doppler were utilized during procedure). Indications:    CHF-Acute Diastolic I50.31  History:        Patient has prior history of Echocardiogram examinations, most                 recent 06/09/2022. COPD; Risk Factors:Dyslipidemia and Current                 Smoker. Breast cancer (R) breast.  Sonographer:    Thea Norlander RCS Referring Phys: [id number] VASUNDHRA RATHORE  Sonographer Comments: Image acquisition challenging due to patient body habitus. IMPRESSIONS  1. Left ventricular ejection fraction, by estimation, is 60 to 65%. The left ventricle has normal function. The left ventricle has no regional wall motion abnormalities. Left ventricular diastolic parameters are consistent with Grade II diastolic dysfunction (pseudonormalization).  2. Right ventricular systolic function is normal. The right  ventricular size is normal.  3. Left atrial size was mildly dilated.  4. The mitral valve is normal in structure. No evidence of mitral valve regurgitation. No evidence of mitral stenosis.  5. The aortic valve is tricuspid. Aortic valve regurgitation is not visualized. No aortic stenosis is present.  6. The inferior vena cava is normal in size with greater than 50% respiratory variability, suggesting right atrial pressure of 3 mmHg. FINDINGS  Left Ventricle: Left ventricular ejection fraction, by estimation, is 60 to 65%. The left ventricle has normal function. The left ventricle has no regional wall motion abnormalities. The left ventricular internal cavity size was normal in size. There is  no left ventricular hypertrophy. Left ventricular diastolic parameters are consistent with Grade II diastolic dysfunction (pseudonormalization). Right Ventricle: The right ventricular size is normal. Right ventricular systolic function is normal. Left Atrium: Left atrial size was mildly dilated. Right Atrium: Right atrial size was normal in size.  Pericardium: There is no evidence of pericardial effusion. Mitral Valve: The mitral valve is normal in structure. Mild mitral annular calcification. No evidence of mitral valve regurgitation. No evidence of mitral valve stenosis. Tricuspid Valve: The tricuspid valve is normal in structure. Tricuspid valve regurgitation is trivial. No evidence of tricuspid stenosis. Aortic Valve: The aortic valve is tricuspid. Aortic valve regurgitation is not visualized. No aortic stenosis is present. Aortic valve peak gradient measures 3.2 mmHg. Pulmonic Valve: The pulmonic valve was normal in structure. Pulmonic valve regurgitation is mild. No evidence of pulmonic stenosis. Aorta: The aortic root is normal in size and structure. Venous: The inferior vena cava is normal in size with greater than 50% respiratory variability, suggesting right atrial pressure of 3 mmHg. IAS/Shunts: No atrial level shunt detected by color flow Doppler.  LEFT VENTRICLE PLAX 2D LVIDd:         3.70 cm   Diastology LVIDs:         2.40 cm   LV e' medial:    5.87 cm/s LV PW:         1.10 cm   LV E/e' medial:  12.9 LV IVS:        0.90 cm   LV e' lateral:   7.83 cm/s LVOT diam:     2.10 cm   LV E/e' lateral: 9.6 LV SV:         46 LV SV Index:   28 LVOT Area:     3.46 cm  RIGHT VENTRICLE            IVC RV S prime:     7.54 cm/s  IVC diam: 1.40 cm LEFT ATRIUM           Index        RIGHT ATRIUM           Index LA diam:      3.10 cm 1.86 cm/m   RA Area:     13.20 cm LA Vol (A4C): 43.4 ml 25.96 ml/m  RA Volume:   25.90 ml  15.51 ml/m  AORTIC VALVE AV Area (Vmax): 2.63 cm AV Vmax:        88.90 cm/s AV Peak Grad:   3.2 mmHg LVOT Vmax:      67.60 cm/s LVOT Vmean:     40.400 cm/s LVOT VTI:       0.133 m  AORTA Ao Root diam: 3.60 cm Ao Asc diam:  3.30 cm MITRAL VALVE MV Area (PHT): 3.37 cm    SHUNTS MV Decel Time: 225  msec    Systemic VTI:  0.13 m MV E velocity: 75.50 cm/s  Systemic Diam: 2.10 cm MV A velocity: 61.30 cm/s MV E/A ratio:  1.23 Redell Shallow MD  Electronically signed by Redell Shallow MD Signature Date/Time: 06/25/2024/4:59:34 PM    Final    NM Hepatobiliary Liver Func Result Date: 06/24/2024 CLINICAL DATA:  Cholecystitis. EXAM: NUCLEAR MEDICINE HEPATOBILIARY IMAGING TECHNIQUE: Sequential images of the abdomen were obtained out to 60 minutes following intravenous administration of radiopharmaceutical. RADIOPHARMACEUTICALS:  5 mCi Tc-53m  Choletec  IV 2.7 mg morphine  also administered IV. COMPARISON:  MRI 06/22/2024 FINDINGS: Satisfactory uptake of radiopharmaceutical from the blood pool. Biliary activity visible at 8 minutes. Bowel activity visible at 14 minutes. Initial non filling of the gallbladder over 60 minutes. We administered the 2.7 mg of morphine  IV to cause contraction of the sphincter of OD. Subsequently the gallbladder opacifies, first becoming visible at 19 minutes post morphine  administration and subsequently demonstrating increasing activity until the 30 minute mark. Accordingly the cystic duct and common bile duct are both patent. IMPRESSION: 1. Patent cystic duct, accordingly no supportive evidence for acute cholecystitis. 2. Patent common bile duct 3. Overall normal exam. Electronically Signed   By: Ryan Salvage M.D.   On: 06/24/2024 13:57   DG CHEST PORT 1 VIEW Result Date: 06/23/2024 CLINICAL DATA:  Cough EXAM: PORTABLE CHEST 1 VIEW COMPARISON:  Chest radiograph February 18, 2024 FINDINGS: The heart size and mediastinal contours are within normal limits. Increased interstitial markings of both lung fields without significant consolidation or suspicious pulmonary nodule, increased to prior. Atherosclerotic calcifications of aortic knob and descending aorta. Chronic left rib fractures with mild displacement. No pleural effusion or pneumothorax. IMPRESSION: Diffuse increased interstitial markings of both lung fields without focal consolidation or suspicious pulmonary nodules which can be seen the setting of infection/inflammation, or  early pulmonary edema. Correlate with clinical findings. . Electronically Signed   By: Megan  Zare M.D.   On: 06/23/2024 17:15   MR 3D Recon At Scanner Result Date: 06/22/2024 CLINICAL DATA:  Elevated liver function tests. EXAM: MRI ABDOMEN WITHOUT AND WITH CONTRAST (INCLUDING MRCP) TECHNIQUE: Multiplanar multisequence MR imaging of the abdomen was performed both before and after the administration of intravenous contrast. Heavily T2-weighted images of the biliary and pancreatic ducts were obtained, and three-dimensional MRCP images were rendered by post processing. CONTRAST:  7mL GADAVIST  GADOBUTROL  1 MMOL/ML IV SOLN COMPARISON:  CT 06/21/2024 FINDINGS: Exam is degraded patient respiratory motion. Patient has difficulty falling breath hold commands. Lower chest:  Lung bases are clear. Hepatobiliary: No intrahepatic biliary duct dilatation. No hepatic steatosis. Normal liver parenchyma. Within the lateral segment LEFT hepatic lobe 13 mm lesion is hyperintense on T2 weighted imaging corresponds to lesion of concern on comparison CT. There is motion degradation of the postcontrast T1 weighted imaging however lesion appears to have peripheral enhancement which is typical of benign hemangiomas. MRCP sequences are degraded by patient respiratory motion. No gallstones are evident. No biliary duct dilatation evident. No choledocholithiasis. Common bile duct measures 5 mm (image 21/series 3). Pancreas: Normal pancreatic parenchymal intensity. No ductal dilatation or inflammation. Spleen: Normal spleen. Adrenals/urinary tract: Adrenal glands and kidneys are normal. Stomach/Bowel: Stomach and limited of the small bowel is unremarkable Vascular/Lymphatic: Abdominal aortic normal caliber. No retroperitoneal periportal lymphadenopathy. Musculoskeletal: No aggressive osseous lesion IMPRESSION: 1. Certain sequences are severely degraded by patient respiratory motion. 2. No evidence acute cholecystitis. 3. No choledocholithiasis.  4. Normal common hepatic duct and common bile duct. No evidence  of biliary obstruction. 5. No hepatic steatosis. 6. Probable benign hemangioma in the RIGHT hepatic lobe. Electronically Signed   By: Jackquline Boxer M.D.   On: 06/22/2024 16:06   MR ABDOMEN MRCP W WO CONTAST Result Date: 06/22/2024 CLINICAL DATA:  Elevated liver function tests. EXAM: MRI ABDOMEN WITHOUT AND WITH CONTRAST (INCLUDING MRCP) TECHNIQUE: Multiplanar multisequence MR imaging of the abdomen was performed both before and after the administration of intravenous contrast. Heavily T2-weighted images of the biliary and pancreatic ducts were obtained, and three-dimensional MRCP images were rendered by post processing. CONTRAST:  7mL GADAVIST  GADOBUTROL  1 MMOL/ML IV SOLN COMPARISON:  CT 06/21/2024 FINDINGS: Exam is degraded patient respiratory motion. Patient has difficulty falling breath hold commands. Lower chest:  Lung bases are clear. Hepatobiliary: No intrahepatic biliary duct dilatation. No hepatic steatosis. Normal liver parenchyma. Within the lateral segment LEFT hepatic lobe 13 mm lesion is hyperintense on T2 weighted imaging corresponds to lesion of concern on comparison CT. There is motion degradation of the postcontrast T1 weighted imaging however lesion appears to have peripheral enhancement which is typical of benign hemangiomas. MRCP sequences are degraded by patient respiratory motion. No gallstones are evident. No biliary duct dilatation evident. No choledocholithiasis. Common bile duct measures 5 mm (image 21/series 3). Pancreas: Normal pancreatic parenchymal intensity. No ductal dilatation or inflammation. Spleen: Normal spleen. Adrenals/urinary tract: Adrenal glands and kidneys are normal. Stomach/Bowel: Stomach and limited of the small bowel is unremarkable Vascular/Lymphatic: Abdominal aortic normal caliber. No retroperitoneal periportal lymphadenopathy. Musculoskeletal: No aggressive osseous lesion IMPRESSION: 1. Certain  sequences are severely degraded by patient respiratory motion. 2. No evidence acute cholecystitis. 3. No choledocholithiasis. 4. Normal common hepatic duct and common bile duct. No evidence of biliary obstruction. 5. No hepatic steatosis. 6. Probable benign hemangioma in the RIGHT hepatic lobe. Electronically Signed   By: Jackquline Boxer M.D.   On: 06/22/2024 16:06    Labs:  CBC: Recent Labs    06/23/24 0249 06/24/24 0826 06/25/24 0514 06/26/24 0530  WBC 10.8* 7.9 7.7 9.2  HGB 11.6* 11.1* 11.2* 10.6*  HCT 34.7* 32.8* 33.6* 31.8*  PLT 115* 121* 143* 176    COAGS: No results for input(s): INR, APTT in the last 8760 hours.  BMP: Recent Labs    06/23/24 0249 06/24/24 0826 06/25/24 0514 06/26/24 0530  NA 137 137 135 136  K 3.6 2.9* 4.0 4.0  CL 104 103 103 102  CO2 24 22 25 23   GLUCOSE 109* 94 120* 122*  BUN 7* 5* 7* 7*  CALCIUM  8.3* 8.3* 7.8* 7.8*  CREATININE 0.67 0.61 0.74 0.62  GFRNONAA >60 >60 >60 >60    LIVER FUNCTION TESTS: Recent Labs    06/23/24 0249 06/24/24 0826 06/25/24 0514 06/26/24 0530  BILITOT 0.8 0.9 0.6 0.5  AST 40 22 17 25   ALT 163* 97* 70* 57*  ALKPHOS 90 77 66 58  PROT 5.8* 5.6* 5.4* 5.3*  ALBUMIN 2.9* 2.7* 2.6* 2.5*    Assessment and Plan:  No concerns with R CFA puncture site. Continued L sided weakness and R gaze deviation. Patient to undergo MRI today. She will likely be extubated as her alertness improves today.   Electronically Signed: Laymon Coast, NP 06/26/2024, 8:45 AM   I spent a total of 15 Minutes at the the patient's bedside AND on the patient's hospital floor or unit, greater than 50% of which was counseling/coordinating care for s/p R common carotid ateriogram with intervention 06/25/24.

## 2024-06-26 NOTE — Anesthesia Postprocedure Evaluation (Signed)
 Anesthesia Post Note  Patient: [name]  Procedure(s) Performed: RADIOLOGY WITH ANESTHESIA     Patient location during evaluation: PACU Anesthesia Type: General Level of consciousness: sedated and patient remains intubated per anesthesia plan Pain management: pain level controlled Vital Signs Assessment: post-procedure vital signs reviewed and stable Respiratory status: patient remains intubated per anesthesia plan and patient on ventilator - see flowsheet for VS Cardiovascular status: stable Anesthetic complications: no   No notable events documented.  Last Vitals:  Vitals:   06/26/24 2200 06/26/24 2224  BP: (!) 126/57   Pulse: 74 73  Resp: 16 16  Temp:    SpO2: 99% 95%    Last Pain:  Vitals:   06/26/24 2000  TempSrc: Axillary  PainSc:                  [name]

## 2024-06-26 NOTE — Progress Notes (Signed)
 NAME:  [name], MRN:  [id number], DOB:  27-Feb-1932, LOS: 5 ADMISSION DATE:  06/21/2024 CONSULTATION DATE:  06/25/2024 REFERRING MD:  Jerrie - Neuro, CHIEF COMPLAINT:  Code Stroke, R MCA M1   History of Present Illness:  89 year old woman who presented to initially presented to [facility] 7/5 for RUQ pain, nausea and vomiting. PMHx significant for HLD, PAD, COPD with emphysema, OSA, R breast CA (IDC, stage 1a s/p lumpectomy/XRT/hormone therapy), back pain.  Patient initially presented to [facility] ED 7/3 with nausea/vomiting; her LFTs were abnormal at that time but she left the ED without being seen. She presented back to [facility] 7/5 for RUQ pain with associated nausea/vomiting and poor PO intake. CT A/P was completed with concern for cholecystitis versus passed choledocholithiasis and acute liver injury. She was admitted for symptom management/CCS consult. Ultimately believed to have acalculous cholecystitis with recommendation for MRCP, IV rehydration and broad-spectrum antibiotics. BCx were noted to be positive for E. Coli (1/4) and ceftriaxone /Flagyl  on board. MRCP 7/6 was unfortunately a poor study due to motion but did not demonstrate acute cholecystitis or biliary dilatation, ?hemangioma R lobe. HIDA 7/8 demonstrated patent cystic duct/CBD negating need for IR drain or cholecystectomy.  On 7/9 at ~1815, patient's daughter noticed sudden onset AMS, L-sided weakness, R gaze deviation and slurred speech. LKW 1814. Code Stroke was called with initial NIHSS 15. CT Head NAICA. CTA Head/Neck demonstrated abrupt occlusion of distal R MCA M1. TNK administered 1851. NIR consulted and taken emergently for mechanical thrombectomy. Unfortunately, revascularization was completed with repeated reocclusion 2/2 long segment atherosclerotic plaque. Decision not to place stent in the settng of increased ICH risk with DAPT and small vessel caliber. Post-procedure CT Head demonstrated small SAH. Patient remained intubated for airway  protection.  PCCM consulted for post-procedure management.  Pertinent Medical History:   Past Medical History:  Diagnosis Date   Anxiety    Back pain    Cancer (HCC)    Depression    Emphysema    Esophageal spasm    Lung nodule    OSA (obstructive sleep apnea)    Osteomalacia    Personal history of radiation therapy    Vertigo    Vitamin D deficiency    Significant Hospital Events: Including procedures, antibiotic start and stop dates in addition to other pertinent events   7/5 - Presented to [facility] for RUQ pain, n/v, poor PO intake. Acute liver injury. Initial concern for cholecystitis. CCS consulted. Bcx +E. Coli (1/4), ceftriaxone /Flagyl  initiated. 7/6 - Concern for acalculous cholecystitis. MRCP poor study, no evidence of stones/significant cholecystitis/bil dil. 7/8 - HIDA negative for cholecystitis, patent CBD/cystic duct. No indication for PCT/cholecystectomy. 7/9 - Code Stroke called ~1815 for acute onset of L-sided weakness/R gaze preference/dysarthria/AMS. CT Head NAICA. TNK given 1851. CTA Head/Neck +abrupt occlusion of R MCA M1 segment. Taken to [facility]. Revascularization completed but unfortunately repeatedly reoccluded. No stent due to increased risk of ICH with DAPT. Left intubated. PCCM consulted. 7/10 sedated on ventilator this a.m. with no complications overnight  Interim History / Subjective:  Sedated on vent, hoping to lighten sedation today for extubation  Objective:  Blood pressure (!) 120/47, pulse (!) 59, temperature (!) 97.4 F (36.3 C), temperature source Axillary, resp. rate 10, height 5' 1 (1.549 m), weight 67.9 kg, SpO2 99%.    Vent Mode: PSV;CPAP FiO2 (%):  [40 %] 40 % Set Rate:  [18 bmp] 18 bmp Vt Set:  [380 mL-500 mL] 380 mL PEEP:  [5 cmH20] 5 cmH20  Pressure Support:  [10 cmH20] 10 cmH20 Plateau Pressure:  [15 cmH20-16 cmH20] 15 cmH20   Intake/Output Summary (Last 24 hours) at 06/26/2024 0905 Last data filed at 06/26/2024 0800 Gross per 24 hour   Intake 1843.24 ml  Output 650 ml  Net 1193.24 ml   Filed Weights   06/21/24 1525 06/21/24 2052  Weight: 67.1 kg 67.9 kg   Physical Examination: General: Acute on chronic ill-appearing deconditioned elderly female lying in bed on mechanical ventilation no acute distress HEENT: ETT, MM pink/moist, PERRL,  Neuro: Sedated on ventilator left upper extremity weakness CV: s1s2 regular rate and rhythm, no murmur, rubs, or gallops,  PULM: Clear to auscultation bilaterally, no increased work of breathing, no breath sounds, tolerating ventilator GI: soft, bowel sounds active in all 4 quadrants, non-tender, non-distended Extremities: warm/dry, no edema  Skin: no rashes or lesions  Resolved Hospital Problem List:    Assessment & Plan:  Acute R MCA M1 occlusion -CT Head NAICA. CTA Head/Neck +abrupt occlusion of R MCA M1 segment. P: Primary management as per stroke team Neuroprotective measures Secondary stroke prevention SBP goal 120-160 PT/OT/SLP when appropriate MRI brain per stroke   Post-procedure respiratory insufficiency requiring MV COPD with emphysema History of OSA P: Continue to lighten sedation, hopeful for extubation today Continue ventilator support with lung protective strategies  Wean PEEP and FiO2 for sats greater than 90%. Head of bed elevated 30 degrees. Plateau pressures less than 30 cm H20.  Follow intermittent chest x-ray and ABG.   SAT/SBT as tolerated, mentation preclude extubation  Ensure adequate pulmonary hygiene  Follow cultures  VAP bundle in place  PAD protocol Coordinate with care team to determine timing for extubation around MRI imaging  E. coli bacteremia  -Most likely source is gallbladder, per chart review it appears patient denied any urinary sources but unfortunately urine culture was never sent -It appears patient has underwent extensive workup all of which are inconclusive for acute cholecystitis however clinically data supports diagnosis.   General surgery has evaluated patient and has not recommended any surgical interventions P: Continue ceftriaxone  Trend CBC and fever curve  PAD HLD P: Continuous telemetry Continue aspirin  and statin Optimize electrolytes  Back pain P: Supportive care  R breast CA -IDC, stage 1a s/p lumpectomy/XRT/hormone therapy, in remission. P: Outpatient follow-up  Best Practice: (right click and Reselect all SmartList Selections daily)   Diet/type: NPO DVT prophylaxis: SCDs GI prophylaxis: PPI Lines: N/A Foley:  Yes, and it is still needed Code Status:  full code Last date of multidisciplinary goals of care discussion [Per Primary Team]  Critical care time:   CRITICAL CARE Performed by: Jin Shockley D. Harris   Total critical care time: 38 minutes  Critical care time was exclusive of separately billable procedures and treating other patients.  Critical care was necessary to treat or prevent imminent or life-threatening deterioration.  Critical care was time spent personally by me on the following activities: development of treatment plan with patient and/or surrogate as well as nursing, discussions with consultants, evaluation of patient's response to treatment, examination of patient, obtaining history from patient or surrogate, ordering and performing treatments and interventions, ordering and review of laboratory studies, ordering and review of radiographic studies, pulse oximetry and re-evaluation of patient's condition.  Dimple Bastyr D. Harris, NP-C [facility] Personal contact information can be found on Amion  If no contact or response made please call [phone] 06/26/2024, 9:21 AM

## 2024-06-26 NOTE — Evaluation (Signed)
 Clinical/Bedside Swallow Evaluation Patient Details  Name: [name] MRN: [id number] Date of Birth: [birth year]  Today's Date: 06/26/2024 Time: SLP Start Time (ACUTE ONLY): 1601 SLP Stop Time (ACUTE ONLY): 1619 SLP Time Calculation (min) (ACUTE ONLY): 18 min  Past Medical History:  Past Medical History:  Diagnosis Date   Anxiety    Back pain    Cancer (HCC)    Depression    Emphysema    Esophageal spasm    Lung nodule    OSA (obstructive sleep apnea)    Osteomalacia    Personal history of radiation therapy    Vertigo    Vitamin D deficiency    Past Surgical History:  Past Surgical History:  Procedure Laterality Date   ABDOMINAL HYSTERECTOMY     APPENDECTOMY     BREAST BIOPSY Right 08/31/2020   BREAST EXCISIONAL BIOPSY Left 1978   BREAST LUMPECTOMY Right 09/30/2020   BREAST LUMPECTOMY WITH RADIOACTIVE SEED LOCALIZATION Right 09/30/2020   Procedure: RIGHT BREAST LUMPECTOMY WITH RADIOACTIVE SEED LOCALIZATION;  Surgeon: Curvin Deward MOULD, MD;  Location: [facility];  Service: General;  Laterality: Right;   CARPAL TUNNEL RELEASE     RADIOLOGY WITH ANESTHESIA N/A 06/25/2024   Procedure: RADIOLOGY WITH ANESTHESIA;  Surgeon: Dolphus Carrion, MD;  Location: [facility] OR;  Service: Radiology;  Laterality: N/A;   ROTATOR CUFF REPAIR     HPI:  [name] is an 89 yo female presenting to ED 7/5 for RUQ pain, nausea/vomiting. Found to have suspected cholecystitis vs passed choledocholithiasis and acute liver injury, hyponatremia, bacteriuria, and hematuria. Initially seen at [facility] ED 7/3 with n/v and elevated LFTs but left without being seen. Daughter noted sudden onset AMS and L sided weakness 7/9 with CTA showing abrupt occlusion of distal R MCA M1 s/p TNK. Taken to IR with unsuccessful revascularization (repeated reocclusion), post-procedure CTH showed small SAH. Remained intubated after the procedure, 7/9-7/10. PMH includes HLD, PAD, COPD with emphysema, OSA, R breast cancer,  back pain, esophageal spasm    Assessment / Plan / Recommendation  Clinical Impression  Prior to code stroke and intubation, pt was on a full liquid diet secondary to cholecystitis. She presents with moderate L sided facial and lingual asymmetry and a R gaze preference which affects her ability to track POs as they are presented. She was lethargic, initially falling asleep with ice chips in her mouth but grew more alert as more family members arrived to room. Pt swallows multiple times with all boluses of ice chips, thin tspns of thin liquids, and purees. She coughs immediately following ice chips and thin liquids, which is forceful. Recommend NPO status be maintained with the exception of meds crushed in puree. Education was provided to pt and her family. SLP will check in to assess readiness to participate in an instrumental study. Will f/u. SLP Visit Diagnosis: Dysphagia, unspecified (R13.10)    Aspiration Risk  Moderate aspiration risk    Diet Recommendation NPO except meds    Medication Administration: Crushed with puree    Other  Recommendations Oral Care Recommendations: Oral care QID     Assistance Recommended at Discharge    Functional Status Assessment Patient has had a recent decline in their functional status and demonstrates the ability to make significant improvements in function in a reasonable and predictable amount of time.  Frequency and Duration min 2x/week  2 weeks       Prognosis Prognosis for improved oropharyngeal function: Good Barriers to Reach Goals: Cognitive deficits;Severity  of deficits      Swallow Study   General HPI: [name] is an 89 yo female presenting to ED 7/5 for RUQ pain, nausea/vomiting. Found to have suspected cholecystitis vs passed choledocholithiasis and acute liver injury, hyponatremia, bacteriuria, and hematuria. Initially seen at [facility] ED 7/3 with n/v and elevated LFTs but left without being seen. Daughter noted sudden onset AMS and L  sided weakness 7/9 with CTA showing abrupt occlusion of distal R MCA M1 s/p TNK. Taken to IR with unsuccessful revascularization (repeated reocclusion), post-procedure CTH showed small SAH. Remained intubated after the procedure, 7/9-7/10. PMH includes HLD, PAD, COPD with emphysema, OSA, R breast cancer, back pain, esophageal spasm Type of Study: Bedside Swallow Evaluation Previous Swallow Assessment: none in chart Diet Prior to this Study: NPO Temperature Spikes Noted: No Respiratory Status: Nasal cannula History of Recent Intubation: Yes Total duration of intubation (days): 2 days Date extubated: 06/26/24 Behavior/Cognition: Alert;Cooperative;Requires cueing Oral Cavity Assessment: Dry Oral Care Completed by SLP: No Oral Cavity - Dentition: Dentures, not available;Poor condition;Missing dentition Vision: Functional for self-feeding Self-Feeding Abilities: Total assist Patient Positioning: Upright in bed Baseline Vocal Quality: Hoarse Volitional Cough: Strong Volitional Swallow: Able to elicit    Oral/Motor/Sensory Function Overall Oral Motor/Sensory Function: Moderate impairment Facial ROM: Reduced left;Suspected CN VII (facial) dysfunction Facial Symmetry: Abnormal symmetry left;Suspected CN VII (facial) dysfunction Facial Strength: Reduced left;Suspected CN VII (facial) dysfunction Lingual ROM: Reduced left;Suspected CN XII (hypoglossal) dysfunction Lingual Symmetry: Abnormal symmetry left;Suspected CN XII (hypoglossal) dysfunction Lingual Strength: Reduced;Suspected CN XII (hypoglossal) dysfunction   Ice Chips Ice chips: Impaired Presentation: Spoon Oral Phase Functional Implications: Oral holding Pharyngeal Phase Impairments: Multiple swallows;Cough - Immediate   Thin Liquid Thin Liquid: Impaired Presentation: Spoon;Cup Pharyngeal  Phase Impairments: Multiple swallows;Cough - Immediate    Nectar Thick Nectar Thick Liquid: Not tested   Honey Thick Honey Thick Liquid: Not  tested   Puree Puree: Impaired Presentation: Spoon Pharyngeal Phase Impairments: Multiple swallows   Solid     Solid: Not tested      Damien Blumenthal, M.A., CCC-SLP Speech Language Pathology, Acute Rehabilitation Services  Secure Chat preferred [phone]  06/26/2024,5:07 PM

## 2024-06-26 NOTE — Evaluation (Signed)
 Speech Language Pathology Evaluation Patient Details Name: [name] MRN: [id number] DOB: 1932-09-30 Today's Date: 06/26/2024 Time: 8398-8380 SLP Time Calculation (min) (ACUTE ONLY): 18 min  Problem List:  Patient Active Problem List   Diagnosis Date Noted   Sepsis (HCC) 06/26/2024   Middle cerebral artery embolism, right 06/25/2024   Cholecystitis 06/21/2024   Abnormal LFTs 06/21/2024   Hyponatremia 06/21/2024   Asymptomatic bacteriuria 06/21/2024   Family history of breast cancer 09/09/2020   Carcinoma of lower-inner quadrant of right breast in female, estrogen receptor positive (HCC) 09/08/2020   Malignant neoplasm of upper-inner quadrant of right breast in female, estrogen receptor positive (HCC) 09/03/2020   Rib fractures 03/14/2020   Vertigo 09/13/2017   Abnormal finding on imaging 04/22/2012   Dyslipidemia 04/22/2012   Depression 04/22/2012   Anxiety 04/22/2012   Tobacco abuse 04/22/2012   COPD (chronic obstructive pulmonary disease) (HCC) 04/22/2012   Gait abnormality 04/22/2012   Past Medical History:  Past Medical History:  Diagnosis Date   Anxiety    Back pain    Cancer (HCC)    Depression    Emphysema    Esophageal spasm    Lung nodule    OSA (obstructive sleep apnea)    Osteomalacia    Personal history of radiation therapy    Vertigo    Vitamin D deficiency    Past Surgical History:  Past Surgical History:  Procedure Laterality Date   ABDOMINAL HYSTERECTOMY     APPENDECTOMY     BREAST BIOPSY Right 08/31/2020   BREAST EXCISIONAL BIOPSY Left 1978   BREAST LUMPECTOMY Right 09/30/2020   BREAST LUMPECTOMY WITH RADIOACTIVE SEED LOCALIZATION Right 09/30/2020   Procedure: RIGHT BREAST LUMPECTOMY WITH RADIOACTIVE SEED LOCALIZATION;  Surgeon: Curvin Deward MOULD, MD;  Location: [facility];  Service: General;  Laterality: Right;   CARPAL TUNNEL RELEASE     RADIOLOGY WITH ANESTHESIA N/A 06/25/2024   Procedure: RADIOLOGY WITH ANESTHESIA;  Surgeon:  Dolphus Carrion, MD;  Location: [facility];  Service: Radiology;  Laterality: N/A;   ROTATOR CUFF REPAIR     HPI:  [name] is an 89 yo female presenting to ED 7/5 for RUQ pain, nausea/vomiting. Found to have suspected cholecystitis vs passed choledocholithiasis and acute liver injury, hyponatremia, bacteriuria, and hematuria. Initially seen at [facility] ED 7/3 with n/v and elevated LFTs but left without being seen. Daughter noted sudden onset AMS and L sided weakness 7/9 with CTA showing abrupt occlusion of distal R MCA M1 s/p TNK. Taken to IR with unsuccessful revascularization (repeated reocclusion), post-procedure CTH showed small SAH. Remained intubated after the procedure, 7/9-7/10. PMH includes HLD, PAD, COPD with emphysema, OSA, R breast cancer, back pain, esophageal spasm   Assessment / Plan / Recommendation Clinical Impression  Pt presents with acute cognitive deficits s/p R MCA occlusion. She was agreeable to participate in unstructured tasks but deferred to lethargy when challenged. She is oriented to self, time, and place given prompting. Pt presents with R gaze preference and inability to track past midline even when cued to turn her head. Sustained attention and awareness are significant areas of impairment, most notably during functional tasks. L sided facial and lingual weakness are suspsected to contribute to moderate dysarthria, making her <75% intelligible at the sentence level. Recommend ongoing SLP f/u to target deficits listed above. Will follow.    SLP Assessment  SLP Recommendation/Assessment: Patient needs continued Speech Language Pathology Services SLP Visit Diagnosis: Dysarthria and anarthria (R47.1);Cognitive communication deficit (R41.841)  Assistance Recommended at Discharge  Frequent or constant Supervision/Assistance  Functional Status Assessment Patient has had a recent decline in their functional status and demonstrates the ability to make significant  improvements in function in a reasonable and predictable amount of time.  Frequency and Duration min 2x/week  2 weeks      SLP Evaluation Cognition  Overall Cognitive Status: Impaired/Different from baseline Arousal/Alertness: Awake/alert Orientation Level: Oriented to person;Oriented to place;Oriented to time Attention: Sustained Sustained Attention: Impaired Sustained Attention Impairment: Verbal basic;Functional basic Memory: Impaired Memory Impairment: Decreased recall of new information Awareness: Impaired Awareness Impairment: Intellectual impairment Problem Solving: Impaired Problem Solving Impairment: Functional basic       Comprehension  Auditory Comprehension Overall Auditory Comprehension: Appears within functional limits for tasks assessed    Expression Expression Primary Mode of Expression: Verbal Verbal Expression Overall Verbal Expression: Appears within functional limits for tasks assessed   Oral / Motor  Oral Motor/Sensory Function Overall Oral Motor/Sensory Function: Moderate impairment Facial ROM: Reduced left;Suspected CN VII (facial) dysfunction Facial Symmetry: Abnormal symmetry left;Suspected CN VII (facial) dysfunction Facial Strength: Reduced left;Suspected CN VII (facial) dysfunction Lingual ROM: Reduced left;Suspected CN XII (hypoglossal) dysfunction Lingual Symmetry: Abnormal symmetry left;Suspected CN XII (hypoglossal) dysfunction Lingual Strength: Reduced;Suspected CN XII (hypoglossal) dysfunction Motor Speech Overall Motor Speech: Impaired Respiration: Within functional limits Phonation: Normal Resonance: Within functional limits Articulation: Impaired Level of Impairment: Sentence Intelligibility: Intelligibility reduced Sentence: 50-74% accurate            Damien Blumenthal, M.A., CCC-SLP Speech Language Pathology, Acute Rehabilitation Services  Secure Chat preferred [phone]  06/26/2024, 5:13 PM

## 2024-06-26 NOTE — Progress Notes (Signed)
 STROKE TEAM PROGRESS NOTE   BRIEF HPI [name] is a 89 y.o. female with history of COPD, PAD, HLD, OSA, right breast carcinoma stage Ia s/p lumpectomy, radiation, and hormone therapy in remission admitted initially on 7/5 with RUQ abdominal pain, nausea, vomiting with initial workup revealing elevated LFTs and possible cholecystitis/E. coli bacteremia s/p 5 days of ceftriaxone .  Hospital course complicated by hallucinations, encephalopathy, some difficulty breathing treated with diuretics.  While patient's daughter was visiting her in the hospital yesterday, the patient developed a sudden onset of left-sided weakness with a right gaze deviation and slurred speech.  SIGNIFICANT HOSPITAL EVENTS 7/5: Admitted for RUQ pain, nausea, vomiting - Hospitalization complicated by encephalopathy, hallucinations - E. Coli bacteremia treated with 5 days of ceftriaxone  7/9:  - 18:15 daughter witnessed patient with sudden onset slurred speech, left-sided weakness, and right gaze deviation for which a Code Stroke was activated - TNKase  administered (no contraindication per primary team and surgery) - CTH with right MCA M1 occlusion, taken to IR for thrombectomy with TICI 2B revacaularization - Remained intubated for airway protection and admitted to the ICU overnight   - Sedation paused this morning with some prolonged sedating effects   INTERIM HISTORY/SUBJECTIVE No family is present at bedside this morning.  Sedation off for one hour prior to assessment with some ongoing drowsiness though she is able to arouse to interact with examiner.  Per CCM, will try to extubate today as mental status/SBT allows.  Patient opens eyes with right gaze preference, will fixate and track to midline, initially inconsistent following of commands on the right that becomes more consistent with coaching and repeated instruction, she nods yes and no to examiner questions,.   OBJECTIVE CBC    Component Value Date/Time    WBC 9.2 06/26/2024 0530   RBC 3.36 (L) 06/26/2024 0530   HGB 10.6 (L) 06/26/2024 0530   HGB 13.2 05/05/2024 1343   HCT 31.8 (L) 06/26/2024 0530   PLT 176 06/26/2024 0530   PLT 150 05/05/2024 1343   MCV 94.6 06/26/2024 0530   MCH 31.5 06/26/2024 0530   MCHC 33.3 06/26/2024 0530   RDW 13.5 06/26/2024 0530   LYMPHSABS 0.8 06/26/2024 0530   MONOABS 0.9 06/26/2024 0530   EOSABS 0.2 06/26/2024 0530   BASOSABS 0.1 06/26/2024 0530   BMET    Component Value Date/Time   NA 136 06/26/2024 0530   K 4.0 06/26/2024 0530   CL 102 06/26/2024 0530   CO2 23 06/26/2024 0530   GLUCOSE 122 (H) 06/26/2024 0530   BUN 7 (L) 06/26/2024 0530   CREATININE 0.62 06/26/2024 0530   CREATININE 0.74 05/05/2024 1343   CALCIUM  7.8 (L) 06/26/2024 0530   GFRNONAA >60 06/26/2024 0530   GFRNONAA >60 05/05/2024 1343   No results found for: HGBA1C  Lab Results  Component Value Date   CHOL 96 06/26/2024   HDL 35 (L) 06/26/2024   LDLCALC 46 06/26/2024   TRIG 74 06/26/2024   TRIG 74 06/26/2024   CHOLHDL 2.7 06/26/2024   Drugs of Abuse  No results found for: LABOPIA, COCAINSCRNUR, LABBENZ, AMPHETMU, THCU, LABBARB   Urinalysis    Component Value Date/Time   COLORURINE YELLOW 06/21/2024 1532   APPEARANCEUR CLEAR 06/21/2024 1532   LABSPEC 1.013 06/21/2024 1532   PHURINE 7.0 06/21/2024 1532   GLUCOSEU NEGATIVE 06/21/2024 1532   HGBUR MODERATE (A) 06/21/2024 1532   BILIRUBINUR NEGATIVE 06/21/2024 1532   KETONESUR NEGATIVE 06/21/2024 1532   PROTEINUR NEGATIVE 06/21/2024 1532  UROBILINOGEN 1.0 11/01/2007 1614   NITRITE POSITIVE (A) 06/21/2024 1532   LEUKOCYTESUR NEGATIVE 06/21/2024 1532   IMAGING past 24 hours Portable Chest x-ray Result Date: 06/25/2024 CLINICAL DATA:  Intubated EXAM: PORTABLE CHEST 1 VIEW COMPARISON:  06/23/2024, 03/16/2020 FINDINGS: Endotracheal tube tip is about 2.4 cm superior to the carina. Enteric tube tip below the diaphragm but incompletely assessed. Minimal  patchy atelectasis or scarring left base. Stable cardiomediastinal silhouette with aortic atherosclerosis. No pneumothorax IMPRESSION: Endotracheal tube tip about 2.4 cm superior to the carina. Minimal patchy atelectasis or scarring at the left base. Electronically Signed   By: Luke Bun M.D.   On: 06/25/2024 23:38   DG Abd 1 View Result Date: 06/25/2024 CLINICAL DATA:  Feeding tube placement EXAM: ABDOMEN - 1 VIEW COMPARISON:  03/20/2024 FINDINGS: Enteric tube tip and side port overlie the proximal stomach. Aortic atherosclerosis. Contrast within the renal collecting systems. IMPRESSION: Enteric tube tip and side port overlie the proximal stomach. Electronically Signed   By: Luke Bun M.D.   On: 06/25/2024 23:37   CT ANGIO HEAD NECK W WO CM (CODE STROKE) Result Date: 06/25/2024 CLINICAL DATA:  Neuro deficit, acute, stroke suspected EXAM: CT ANGIOGRAPHY HEAD AND NECK WITH AND WITHOUT CONTRAST TECHNIQUE: Multidetector CT imaging of the head and neck was performed using the standard protocol during bolus administration of intravenous contrast. Multiplanar CT image reconstructions and MIPs were obtained to evaluate the vascular anatomy. Carotid stenosis measurements (when applicable) are obtained utilizing NASCET criteria, using the distal internal carotid diameter as the denominator. RADIATION DOSE REDUCTION: This exam was performed according to the departmental dose-optimization program which includes automated exposure control, adjustment of the mA and/or kV according to patient size and/or use of iterative reconstruction technique. CONTRAST:  75mL OMNIPAQUE  IOHEXOL  350 MG/ML SOLN COMPARISON:  CT of the head dated June 25, 2024. FINDINGS: CTA NECK FINDINGS Aortic arch: Mild to moderate calcific plaque within the aortic arch. The brachiocephalic artery peers widely patent. Right carotid system: The common carotid and internal carotid arteries are normal in caliber and demonstrates mild calcific  atheromatous disease. The internal carotid artery is tortuous. Left carotid system: The common carotid artery is normal in caliber and demonstrates mild calcific atheromatous disease. There is mild calcific plaque present proximally within the internal carotid artery, but no luminal stenosis. Vertebral arteries: The right vertebral artery is dominant and the left is relatively hypoplastic. The V1 segment is tortuous. Skeleton: Multilevel degenerative disc disease throughout the cervical spine. No osseous lesions. Other neck: Negative. Upper chest: Mild-to-moderate central lobular emphysema. Review of the MIP images confirms the above findings CTA HEAD FINDINGS Anterior circulation: There is an abrupt cut off of the distal M1 segment of the right middle cerebral artery with limited filling of the M2 segments. The anterior cerebral arteries and the left middle cerebral artery are normal in caliber and demonstrate no evidence of aneurysm or flow-limiting stenosis. There is mild calcific plaque within the carotid siphons, but no flow limiting stenosis. Posterior circulation: The vertebrobasilar system is unremarkable. The posterior cerebral arteries and the cerebellar arteries are patent. The right vertebral artery is dominant. Venous sinuses: Patent. Anatomic variants: None. Review of the MIP images confirms the above findings IMPRESSION: 1. Abrupt occlusion of the distal M1 segment of the right middle cerebral artery. These results were called by telephone at the time of interpretation on 06/25/2024 at 7:21 pm to provider Dr. LOLA JERNIGAN , who verbally acknowledged these results. Electronically Signed   By: Evalene  Hugh M.D.   On: 06/25/2024 19:32   CT HEAD CODE STROKE WO CONTRAST Result Date: 06/25/2024 CLINICAL DATA:  Code stroke.  Neuro deficit, concern for stroke. EXAM: CT HEAD WITHOUT CONTRAST TECHNIQUE: Contiguous axial images were obtained from the base of the skull through the vertex without  intravenous contrast. RADIATION DOSE REDUCTION: This exam was performed according to the departmental dose-optimization program which includes automated exposure control, adjustment of the mA and/or kV according to patient size and/or use of iterative reconstruction technique. COMPARISON:  MRI head 05/06/2022. FINDINGS: Brain: No acute intracranial hemorrhage. No CT evidence of acute infarct. Nonspecific hypoattenuation in the periventricular and subcortical white matter favored to reflect chronic microvascular ischemic changes. no edema, mass effect, or midline shift. The basilar cisterns are patent. Ventricles: The ventricles are normal. Vascular: No hyperdense vessel or unexpected calcification. Skull: No acute or aggressive finding. Orbits: Bilateral lens replacement. Sinuses: The visualized paranasal sinuses are clear. Other: Mastoid air cells are clear. ASPECTS [facility] Stroke Program Early CT Score) - Ganglionic level infarction (caudate, lentiform nuclei, internal capsule, insula, M1-M3 cortex): 7 - Supraganglionic infarction (M4-M6 cortex): 3 Total score (0-10 with 10 being normal): 10 IMPRESSION: 1. No CT evidence of acute intracranial abnormality. 2. ASPECTS is 10 These results were communicated to Dr. Jerrie at 6:58 pm on 06/25/2024 by text page via the [facility] messaging system. Electronically Signed   By: Donnice Mania M.D.   On: 06/25/2024 18:58   ECHOCARDIOGRAM COMPLETE Result Date: 06/25/2024    ECHOCARDIOGRAM REPORT   Patient Name:   CALEIGH RABELO Date of Exam: 06/25/2024 Medical Rec #:  [id number]       Height:       61.0 in Accession #:    [id number]      Weight:       149.7 lb Date of Birth:  April 07, 1932       BSA:          1.670 m Patient Age:    89 years        BP:           118/47 mmHg Patient Gender: F               HR:           71 bpm. Exam Location:  Inpatient Procedure: 2D Echo, Cardiac Doppler and Color Doppler (Both Spectral and Color            Flow Doppler were utilized during procedure).  Indications:    CHF-Acute Diastolic I50.31  History:        Patient has prior history of Echocardiogram examinations, most                 recent 06/09/2022. COPD; Risk Factors:Dyslipidemia and Current                 Smoker. Breast cancer (R) breast.  Sonographer:    Thea Norlander RCS Referring Phys: [id number] VASUNDHRA RATHORE  Sonographer Comments: Image acquisition challenging due to patient body habitus. IMPRESSIONS  1. Left ventricular ejection fraction, by estimation, is 60 to 65%. The left ventricle has normal function. The left ventricle has no regional wall motion abnormalities. Left ventricular diastolic parameters are consistent with Grade II diastolic dysfunction (pseudonormalization).  2. Right ventricular systolic function is normal. The right ventricular size is normal.  3. Left atrial size was mildly dilated.  4. The mitral valve is normal in structure. No evidence of mitral valve regurgitation. No evidence of mitral  stenosis.  5. The aortic valve is tricuspid. Aortic valve regurgitation is not visualized. No aortic stenosis is present.  6. The inferior vena cava is normal in size with greater than 50% respiratory variability, suggesting right atrial pressure of 3 mmHg. FINDINGS  Left Ventricle: Left ventricular ejection fraction, by estimation, is 60 to 65%. The left ventricle has normal function. The left ventricle has no regional wall motion abnormalities. The left ventricular internal cavity size was normal in size. There is  no left ventricular hypertrophy. Left ventricular diastolic parameters are consistent with Grade II diastolic dysfunction (pseudonormalization). Right Ventricle: The right ventricular size is normal. Right ventricular systolic function is normal. Left Atrium: Left atrial size was mildly dilated. Right Atrium: Right atrial size was normal in size. Pericardium: There is no evidence of pericardial effusion. Mitral Valve: The mitral valve is normal in structure. Mild mitral  annular calcification. No evidence of mitral valve regurgitation. No evidence of mitral valve stenosis. Tricuspid Valve: The tricuspid valve is normal in structure. Tricuspid valve regurgitation is trivial. No evidence of tricuspid stenosis. Aortic Valve: The aortic valve is tricuspid. Aortic valve regurgitation is not visualized. No aortic stenosis is present. Aortic valve peak gradient measures 3.2 mmHg. Pulmonic Valve: The pulmonic valve was normal in structure. Pulmonic valve regurgitation is mild. No evidence of pulmonic stenosis. Aorta: The aortic root is normal in size and structure. Venous: The inferior vena cava is normal in size with greater than 50% respiratory variability, suggesting right atrial pressure of 3 mmHg. IAS/Shunts: No atrial level shunt detected by color flow Doppler.  LEFT VENTRICLE PLAX 2D LVIDd:         3.70 cm   Diastology LVIDs:         2.40 cm   LV e' medial:    5.87 cm/s LV PW:         1.10 cm   LV E/e' medial:  12.9 LV IVS:        0.90 cm   LV e' lateral:   7.83 cm/s LVOT diam:     2.10 cm   LV E/e' lateral: 9.6 LV SV:         46 LV SV Index:   28 LVOT Area:     3.46 cm  RIGHT VENTRICLE            IVC RV S prime:     7.54 cm/s  IVC diam: 1.40 cm LEFT ATRIUM           Index        RIGHT ATRIUM           Index LA diam:      3.10 cm 1.86 cm/m   RA Area:     13.20 cm LA Vol (A4C): 43.4 ml 25.96 ml/m  RA Volume:   25.90 ml  15.51 ml/m  AORTIC VALVE AV Area (Vmax): 2.63 cm AV Vmax:        88.90 cm/s AV Peak Grad:   3.2 mmHg LVOT Vmax:      67.60 cm/s LVOT Vmean:     40.400 cm/s LVOT VTI:       0.133 m  AORTA Ao Root diam: 3.60 cm Ao Asc diam:  3.30 cm MITRAL VALVE MV Area (PHT): 3.37 cm    SHUNTS MV Decel Time: 225 msec    Systemic VTI:  0.13 m MV E velocity: 75.50 cm/s  Systemic Diam: 2.10 cm MV A velocity: 61.30 cm/s MV E/A ratio:  1.23 Redell Shallow  MD Electronically signed by Redell Shallow MD Signature Date/Time: 06/25/2024/4:59:34 PM    Final    Vitals:   06/26/24 0545  06/26/24 0600 06/26/24 0700 06/26/24 0759  BP:  (!) 136/49 (!) 120/47   Pulse: (!) 57 (!) 57 (!) 58 (!) 59  Resp: 14 16 17 10   Temp:    (!) 97.4 F (36.3 C)  TempSrc:    Axillary  SpO2: 98% 98% 98% 99%  Weight:      Height:       PHYSICAL EXAM General:  Critically ill appearing, elderly, Caucasian female laying in ICU bed Psych:  Drowsy initially but with stimulation she arouses and is cooperative CV: Sinus bradycardia with rate in the 50's Respiratory:  Remains intubated on PS/CPAP, not on sedation GI: Abdomen soft and nontender  NEURO:  Mental Status: Drowsy initially but arouses with repeated stimuli.  She does nod yes when asked if she knows where she is and yes again to hospital.  Initially, command following was inconsistent but with repeated instruction, if becomes more consistent.  She is able to follow simple commands on the right upper extremity and lower extremity. Speech/Language: Unable to assess due to ETT/mechanical ventilation  Cranial Nerves:  II: PERRL.  Inconsistent blink to threat bilaterally though appears to have a left hemianopsia III, IV, VI: Right gaze preference, will track towards midline but will not cross midline towards the left V: Does not respond to light touch on either side of the face VII: Secured due to ETT VIII: Hearing is intact to voice. IX, X: Cough intact XI: Head is grossly midline XII: Does not protrude tongue to command Motor: Left upper extremity is flaccid with minimal movement of the left lower extremity.  Patient has antigravity movement of the right upper and lower extremity and will sustain elevation with constant coaching. Sensation: grimaces with noxious stimuli throughout Coordination: Does not perform Gait: Deferred  ASSESSMENT/PLAN Stroke:  right MCA territory infarct with R M1 occlusion s/p TNK and IR with TICI 2B revascularization, etiology:  most likely large vessel disease    Code Stroke CT head No CT evidence of  acute intracranial abnormality. ASPECTS is 10. CTA head & neck Abrupt occlusion of the distal M1 segment of the right middle cerebral artery S/p IR with repeated  reocclusion secondary to underlying long segment atherosclerotic plaque and final score TICI2b Post IR CT small focal right perisylvian subarachnoid hemorrhage. MRI brain pending  MRA pending 2D Echo LVEF 60-65%, mild atrial dilation LDL 46 HgbA1c pending VTE prophylaxis - SCDs given TNK administration  aspirin  81 mg daily prior to admission, now on No antithrombotic for now due to [facility] on post-IR CT and within 24h of TNK. Further recommendations pending MRI findings.  Therapy recommendations:  Pending Disposition:  Pending  ? Hx of Stroke/TIA Acute onset of vertigo and gait disturbance in June 2018 with residual truncal ataxia requiring use of walker for ambulation  Concern for possible brainstem/cerebellar stroke at that time  Recommended for MRI evaluation at that time but this was lost to follow up   Acute Acalculous Cholecystitis E. Coli bacteremia Elevated LFTs, resolving Admitted for right upper quadrant pain and nausea vomiting. 1/4 blood cultures + for E. Coli S/p Flagyl  Continue Ceftriaxone  for 10 days per CCM  AST/ALT admission 1658/958 -> current 25/57 Less likely endocarditis given the bacteria species  HTN Home meds:  None Stable Low dose cleviprex  started this morning for blood pressure control Add PRN labetalol , hydralazine  for BP  control and titrate off cleviprex  gtt Blood pressure goal SBP <160 post-thrombectomy with SAH on post-IR CTH Long term BP goal: Normotension  Hyperlipidemia Home meds:  atorvastatin  10 mg LDL 46, goal < 70 Held inpatient due to elevated LFTs High intensity statin not indicated as patient is at goal at lower dose Consider resume statin once po access and LFT normalizes  Dysphagia Patient has post-stroke dysphagia SLP consult Did not pass swallow Currently n.p.o. On IV  fluid with NS @ 50  Post-op Respiratory Insufficiency COPD  Was concerning for fluid overload status post diuretics CCM management appreciated Was on peripheral and fentanyl , now off Wean sedation  SBT per CCM Extubated 7/10  Other Stroke Risk Factors Advanced age Family hx stroke (possible stroke versus MI in patient's father) Remote tobacco use history 56-pack-year history, stopped smoking 8 years ago CAD/PAD Obstructive sleep apnea, on CPAP at home though reported not using effectively PTA  Other Active Problems Hypomagnesemia, Will replace and recheck in AM Anemia, Hbg 13.8 on admission > 10.6 Depression on Lexapro   Hospital day # 5  [name], AGACNP-BC [facility] Pager: [phone]  ATTENDING NOTE: I reviewed above note and agree with the assessment and plan. Pt was seen and examined.   No family at bedside.  Patient still intubated during round, on sedation, eyes closed, however, nod head for place in hospital, follows some simple commands on the right foot and right hand.  Right gaze preference, not cross midline.  Not consistently blinking to visual threat bilaterally, PERRL.  Facial symmetry not able to test due to ET tube, left upper extremity flaccid, right upper extremity able to hold against gravity, right lower extremity able to hold against gravity, left lower extremity slight withdraw to pain. Sensation, coordination and gait not tested.  For detailed assessment and plan, please refer to above as I have made changes wherever appropriate.   Kirsten Cummins, MD PhD Stroke Neurology 06/26/2024 6:05 PM  This patient is critically ill due to right MCA stroke, bacteremia, cholecystitis, intracranial stenosis, SAH and at significant risk of neurological worsening, death form cerebral edema, sepsis, recurrent stroke, hemorrhagic admission and vasospasm and seizure. This patient's care requires constant monitoring of vital signs, hemodynamics,  respiratory and cardiac monitoring, review of multiple databases, neurological assessment, discussion with family, other specialists and medical decision making of high complexity. I spent 40 minutes of neurocritical care time in the care of this patient.    To contact Stroke Continuity provider, please refer to [email or web address]. After hours, contact General Neurology

## 2024-06-26 NOTE — Procedures (Signed)
 Extubation Procedure Note  Patient Details:   Name: [name] DOB: 1932/03/11 MRN: [id number]   Airway Documentation:    Vent end date: 06/26/24 Vent end time: 1230   Evaluation  O2 sats: stable throughout Complications: No apparent complications Patient did tolerate procedure well. Bilateral Breath Sounds: Diminished   Patient extubated per MD's order with RT and RN at bedside. Cuff leak present prior to extubating, no stridor noted post. Patient placed on 3L [place] and tolerating well at this time.      Yes  Rivaldo Hineman JINNY Isaias Brash 06/26/2024, 12:32 PM

## 2024-06-26 NOTE — Progress Notes (Signed)
 Initial Nutrition Assessment  DOCUMENTATION CODES:   Non-severe (moderate) malnutrition in context of social or environmental circumstances  INTERVENTION:   If unable to swallow safely and enteral nutrition support within GOC recommend:  Osmolite 1.5 at 25 ml/h and increase by 10 ml every 8 hours to goal rate of 45 ml/hr (1080 ml per day)  Prosource TF20 60 ml daily  Provides 1700 kcal, 87 gm protein, 820 ml free water daily  100 mg thiamine daily x 7 days MVI with minerals daily   Monitor magnesium  and phosphorus daily x 4 occurrences, MD to replete as needed, as pt is at risk for refeeding syndrome given pt meets criteria for moderate malnutrition.   NUTRITION DIAGNOSIS:   Moderate Malnutrition related to social / environmental circumstances as evidenced by moderate fat depletion, mild muscle depletion.  GOAL:   Patient will meet greater than or equal to 90% of their needs  MONITOR:   I & O's, Diet advancement  REASON FOR ASSESSMENT:   Ventilator    ASSESSMENT:   Pt with PMH of HLD, PAD, COPD with emphysema, OSA, R breast ca s/p lumpectomy/XRT/hormone therapy admitted 7/5 for RUQ pain, N/V, and poor PO intake. Dx with acalculous cholecystitis and E.coli bacteremia.   Pt discussed during ICU rounds and with RN and MD.  Beatris with RN, plan for extubation today. Discussed that pt had previously been on full liquid diet and had not had any solids yet this admission.   7/5 - admitted; clears 7/8 - diet advanced to fulls; MRCP poor study, HIDA negative fro cholecystitis 7/9 - tx to ICU and intubated; dx with R MCA stroke s/p TNK, IR for mechanical thrombectomy which reoccluded 7/10- extubated; awaiting SLP eval  Spoke with daughter who is at bedside. She reports that pt lives alone and had been losing weight. The family brought in a caregiver for the pt from 10 am (when pt wakes) until 3 pm daily.  Breakfast: yogurt or oatmeal Lunch: light ? Sandwich Dinner: frozen  dinner if pt eats it; per daughter more often just eats a small bowl of cereal or a few peanut butter crackers Pt does not like ensure or boost and per daughter they cause constipation.  Per daughter pt's weigh had started to stabilize after caregiver started to help.  Per daughter pt had lost weight and could tell in how her clothes were fitting but does not know her usual body weight.   Medications reviewed and include:  NS @ 50 ml/hr  Labs reviewed:  Mag 1.6 Fe 18 Ferritin 210 Folate 16.0 Vitamin B12 941 CBG 103     NUTRITION - FOCUSED PHYSICAL EXAM:  Flowsheet Row Most Recent Value  Orbital Region Moderate depletion  Upper Arm Region Unable to assess  Thoracic and Lumbar Region Moderate depletion  Buccal Region Unable to assess  Temple Region Mild depletion  Clavicle Bone Region Mild depletion  Clavicle and Acromion Bone Region Mild depletion  Scapular Bone Region Unable to assess  Dorsal Hand Unable to assess  Patellar Region Mild depletion  Anterior Thigh Region Mild depletion  Posterior Calf Region Moderate depletion  Edema (RD Assessment) Mild  Hair Reviewed  Eyes Reviewed  Mouth Unable to assess  Skin Reviewed  Nails Unable to assess    Diet Order:   Diet Order             Diet NPO time specified  Diet effective now  EDUCATION NEEDS:   Education needs have been addressed  Skin:  Skin Assessment: Reviewed RN Assessment  Last BM:  7/9  Height:   Ht Readings from Last 1 Encounters:  06/21/24 5' 1 (1.549 m)    Weight:   Wt Readings from Last 1 Encounters:  06/21/24 67.9 kg    BMI:  Body mass index is 28.28 kg/m.  Estimated Nutritional Needs:   Kcal:  1650-1850  Protein:  85-100 grams  Fluid:  >1.6 L/day  Powell SQUIBB., RD, LDN, CNSC See AMiON for contact information

## 2024-06-26 NOTE — Progress Notes (Signed)
 SLP Cancellation Note  Patient Details Name: [name] MRN: [id number] DOB: 10/12/1932   Cancelled treatment:       Reason Eval/Treat Not Completed: Patient not medically ready due to currently intubated and unable to participate in speech/language assessment. SLP will follow up for speech language assessment and clinical swallow assessment (if needed) post extubation.    [name] J Nyair Depaulo 06/26/2024, 11:20 AM

## 2024-06-27 ENCOUNTER — Inpatient Hospital Stay (HOSPITAL_COMMUNITY)

## 2024-06-27 DIAGNOSIS — I609 Nontraumatic subarachnoid hemorrhage, unspecified: Secondary | ICD-10-CM | POA: Diagnosis not present

## 2024-06-27 DIAGNOSIS — B9620 Unspecified Escherichia coli [E. coli] as the cause of diseases classified elsewhere: Secondary | ICD-10-CM | POA: Diagnosis not present

## 2024-06-27 DIAGNOSIS — I69391 Dysphagia following cerebral infarction: Secondary | ICD-10-CM | POA: Diagnosis not present

## 2024-06-27 DIAGNOSIS — J449 Chronic obstructive pulmonary disease, unspecified: Secondary | ICD-10-CM | POA: Diagnosis not present

## 2024-06-27 DIAGNOSIS — I63511 Cerebral infarction due to unspecified occlusion or stenosis of right middle cerebral artery: Secondary | ICD-10-CM | POA: Diagnosis not present

## 2024-06-27 DIAGNOSIS — I779 Disorder of arteries and arterioles, unspecified: Secondary | ICD-10-CM | POA: Diagnosis not present

## 2024-06-27 DIAGNOSIS — I6601 Occlusion and stenosis of right middle cerebral artery: Secondary | ICD-10-CM | POA: Diagnosis not present

## 2024-06-27 DIAGNOSIS — R7881 Bacteremia: Secondary | ICD-10-CM | POA: Diagnosis not present

## 2024-06-27 LAB — CBC
HCT: 29.9 % — ABNORMAL LOW (ref 36.0–46.0)
Hemoglobin: 10 g/dL — ABNORMAL LOW (ref 12.0–15.0)
MCH: 31.3 pg (ref 26.0–34.0)
MCHC: 33.4 g/dL (ref 30.0–36.0)
MCV: 93.4 fL (ref 80.0–100.0)
Platelets: 216 K/uL (ref 150–400)
RBC: 3.2 MIL/uL — ABNORMAL LOW (ref 3.87–5.11)
RDW: 13.2 % (ref 11.5–15.5)
WBC: 13.5 K/uL — ABNORMAL HIGH (ref 4.0–10.5)
nRBC: 0 % (ref 0.0–0.2)

## 2024-06-27 LAB — COMPREHENSIVE METABOLIC PANEL WITH GFR
ALT: 60 U/L — ABNORMAL HIGH (ref 0–44)
AST: 37 U/L (ref 15–41)
Albumin: 2.7 g/dL — ABNORMAL LOW (ref 3.5–5.0)
Alkaline Phosphatase: 62 U/L (ref 38–126)
Anion gap: 10 (ref 5–15)
BUN: 5 mg/dL — ABNORMAL LOW (ref 8–23)
CO2: 23 mmol/L (ref 22–32)
Calcium: 7.8 mg/dL — ABNORMAL LOW (ref 8.9–10.3)
Chloride: 103 mmol/L (ref 98–111)
Creatinine, Ser: 0.53 mg/dL (ref 0.44–1.00)
GFR, Estimated: >60 mL/min (ref >60)
Glucose, Bld: 106 mg/dL — ABNORMAL HIGH (ref 70–99)
Potassium: 3.3 mmol/L — ABNORMAL LOW (ref 3.5–5.1)
Sodium: 136 mmol/L (ref 135–145)
Total Bilirubin: 0.8 mg/dL (ref 0.0–1.2)
Total Protein: 5.5 g/dL — ABNORMAL LOW (ref 6.5–8.1)

## 2024-06-27 LAB — PHOSPHORUS
Phosphorus: 2.5 mg/dL (ref 2.5–4.6)
Phosphorus: 2.3 mg/dL — ABNORMAL LOW (ref 2.5–4.6)

## 2024-06-27 LAB — GLUCOSE, CAPILLARY
Glucose-Capillary: 126 mg/dL — ABNORMAL HIGH (ref 70–99)
Glucose-Capillary: 147 mg/dL — ABNORMAL HIGH (ref 70–99)

## 2024-06-27 LAB — MAGNESIUM
Magnesium: 2.3 mg/dL (ref 1.7–2.4)
Magnesium: 2.1 mg/dL (ref 1.7–2.4)

## 2024-06-27 MED ORDER — ASPIRIN 81 MG PO CHEW
81.0000 mg | CHEWABLE_TABLET | Freq: Every day | ORAL | Status: DC
  Administered 2024-06-27 – 2024-07-08 (×12): 81 mg
  Filled 2024-06-27 (×12): qty 1

## 2024-06-27 MED ORDER — OSMOLITE 1.5 CAL PO LIQD
1000.0000 mL | ORAL | Status: DC
  Administered 2024-06-27 – 2024-07-02 (×4): 1000 mL

## 2024-06-27 MED ORDER — POTASSIUM CHLORIDE 20 MEQ PO PACK
40.0000 meq | PACK | Freq: Once | ORAL | Status: AC
  Administered 2024-06-27: 40 meq
  Filled 2024-06-27: qty 2

## 2024-06-27 MED ORDER — THIAMINE MONONITRATE 100 MG PO TABS
100.0000 mg | ORAL_TABLET | Freq: Every day | ORAL | Status: AC
  Administered 2024-06-27 – 2024-07-03 (×7): 100 mg
  Filled 2024-06-27 (×7): qty 1

## 2024-06-27 MED ORDER — ADULT MULTIVITAMIN W/MINERALS CH
1.0000 | ORAL_TABLET | Freq: Every day | ORAL | Status: DC
  Administered 2024-06-27 – 2024-07-08 (×12): 1
  Filled 2024-06-27 (×12): qty 1

## 2024-06-27 MED ORDER — CEFADROXIL 500 MG/5ML PO SUSR
500.0000 mg | Freq: Two times a day (BID) | ORAL | Status: AC
  Administered 2024-06-27 – 2024-06-30 (×8): 500 mg
  Filled 2024-06-27 (×9): qty 5

## 2024-06-27 MED ORDER — POTASSIUM CHLORIDE 10 MEQ/100ML IV SOLN: 10.0000 meq | INTRAVENOUS | Status: DC

## 2024-06-27 MED ORDER — HEPARIN SODIUM (PORCINE) 5000 UNIT/ML IJ SOLN
5000.0000 [IU] | Freq: Three times a day (TID) | INTRAMUSCULAR | Status: DC
  Administered 2024-06-27 – 2024-07-08 (×34): 5000 [IU] via SUBCUTANEOUS
  Filled 2024-06-27 (×34): qty 1

## 2024-06-27 MED ORDER — PROSOURCE TF20 ENFIT COMPATIBL EN LIQD
60.0000 mL | Freq: Every day | ENTERAL | Status: DC
  Administered 2024-06-27 – 2024-07-08 (×12): 60 mL
  Filled 2024-06-27 (×12): qty 60

## 2024-06-27 NOTE — Progress Notes (Signed)
 Nutrition Brief Note    Cortrak feeding tube placed this morning. Discussed with RN. Reached out to Stroke MD, ok to start enteral nutrition via Cortrak. Order is as follows.   - Osmolite 1.5 at 25 ml/h and increase by 10 ml every 8 hours to goal rate of 45 ml/hr (1080 ml per day) - Prosource TF20 60 ml daily Provides 1700 kcal, 87 gm protein, 820 ml free water daily   100 mg thiamine  daily x 7 days MVI with minerals daily    Monitor magnesium  and phosphorus daily x 4 occurrences, MD to replete as needed, as pt is at risk for refeeding syndrome given pt meets criteria for moderate malnutrition.  RD team to continue following pt.    Nestora Glatter RD, LDN Clinical Dietitian

## 2024-06-27 NOTE — Progress Notes (Signed)
 Modified Barium Swallow Study  Patient Details  Name: [name] MRN: [id number] Date of Birth: [birth year]  Today's Date: 06/27/2024  Modified Barium Swallow completed.  Full report located under Chart Review in the Imaging Section.  History of Present Illness [name] is an 89 yo female presenting to ED 7/5 for RUQ pain, nausea/vomiting. Found to have suspected cholecystitis vs passed choledocholithiasis and acute liver injury, hyponatremia, bacteriuria, and hematuria. Initially seen at [facility] ED 7/3 with n/v and elevated LFTs but left without being seen. Daughter noted sudden onset AMS and L sided weakness 7/9 with CTA showing abrupt occlusion of distal R MCA M1 s/p TNK. Taken to IR with unsuccessful revascularization (repeated reocclusion), post-procedure CTH showed small SAH. Remained intubated after the procedure, 7/9-7/10. PMH includes HLD, PAD, COPD with emphysema, OSA, R breast cancer, back pain, esophageal spasm   Clinical Impression Pt exhibits moderate oropharyngeal dyspahgia characterized by mistiming that may also be impacted by chronic factors including suspected cervical osteophytes and a prominent cricopharyngeus. She often achieves complete laryngeal elevation evidenced in the instances of swallow initiation occurring as the bolus reaches her vocal folds and is effectively expelled but this is inconsistent. Thin and nectar thick liquids are ultimately aspirated, and although she is sensate, her cough is not forceful enough to eject aspirates (PAS 7). Oral transit and lingual motion are slow with all consistencies, but most noticeably with honey thick liquids and purees as they inch through the mouth and pharynx, past the open laryngeal vestibule until she initiates a swallow at the level of the pyriform sinues. Dempsey penetration occurs with honey thick liquids occurs and goes unsensed (PAS 5). Residue consistently collects in the oral cavity and she cannot consistently follow  commands to swallow again. The amount of cueing required to initiate use of compensatory strategies makes them ineffective. In addition to these impairments, her mentation is a significant barrier to having POs at this time. Recommend NPO except ice chips in moderation after oral care. SLP will continue following. Factors that may increase risk of adverse event in presence of aspiration [name] & [name] 2021): Reduced cognitive function;Limited mobility;Presence of tubes (ETT, trach, NG, etc.);Frequent aspiration of large volumes  Swallow Evaluation Recommendations Recommendations: NPO;Ice chips PRN after oral care Medication Administration: Via alternative means Oral care recommendations: Oral care QID (4x/day);Oral care before ice chips/water    Damien Blumenthal, M.A., CCC-SLP Speech Language Pathology, Acute Rehabilitation Services  Secure Chat preferred [phone]  06/27/2024,2:30 PM

## 2024-06-27 NOTE — Progress Notes (Signed)
 NAME:  [name], MRN:  [id number], DOB:  1932/07/26, LOS: 6 ADMISSION DATE:  06/21/2024 CONSULTATION DATE:  06/25/2024 REFERRING MD:  Jerrie - Neuro, CHIEF COMPLAINT:  Code Stroke, R MCA M1   History of Present Illness:  89 year old woman who presented to initially presented to [facility] 7/5 for RUQ pain, nausea and vomiting. PMHx significant for HLD, PAD, COPD with emphysema, OSA, R breast CA (IDC, stage 1a s/p lumpectomy/XRT/hormone therapy), back pain.  Patient initially presented to [facility] ED 7/3 with nausea/vomiting; her LFTs were abnormal at that time but she left the ED without being seen. She presented back to [facility] 7/5 for RUQ pain with associated nausea/vomiting and poor PO intake. CT A/P was completed with concern for cholecystitis versus passed choledocholithiasis and acute liver injury. She was admitted for symptom management/CCS consult. Ultimately believed to have acalculous cholecystitis with recommendation for MRCP, IV rehydration and broad-spectrum antibiotics. BCx were noted to be positive for E. Coli (1/4) and ceftriaxone /Flagyl  on board. MRCP 7/6 was unfortunately a poor study due to motion but did not demonstrate acute cholecystitis or biliary dilatation, ?hemangioma R lobe. HIDA 7/8 demonstrated patent cystic duct/CBD negating need for IR drain or cholecystectomy.  On 7/9 at ~1815, patient's daughter noticed sudden onset AMS, L-sided weakness, R gaze deviation and slurred speech. LKW 1814. Code Stroke was called with initial NIHSS 15. CT Head NAICA. CTA Head/Neck demonstrated abrupt occlusion of distal R MCA M1. TNK administered 1851. NIR consulted and taken emergently for mechanical thrombectomy. Unfortunately, revascularization was completed with repeated reocclusion 2/2 long segment atherosclerotic plaque. Decision not to place stent in the settng of increased ICH risk with DAPT and small vessel caliber. Post-procedure CT Head demonstrated small SAH. Patient remained intubated for airway  protection.  PCCM consulted for post-procedure management.  Pertinent Medical History:   Past Medical History:  Diagnosis Date   Anxiety    Back pain    Cancer (HCC)    Depression    Emphysema    Esophageal spasm    Lung nodule    OSA (obstructive sleep apnea)    Osteomalacia    Personal history of radiation therapy    Vertigo    Vitamin D deficiency    Significant Hospital Events: Including procedures, antibiotic start and stop dates in addition to other pertinent events   7/5 - Presented to [facility] for RUQ pain, n/v, poor PO intake. Acute liver injury. Initial concern for cholecystitis. CCS consulted. Bcx +E. Coli (1/4), ceftriaxone /Flagyl  initiated. 7/6 - Concern for acalculous cholecystitis. MRCP poor study, no evidence of stones/significant cholecystitis/bil dil. 7/8 - HIDA negative for cholecystitis, patent CBD/cystic duct. No indication for PCT/cholecystectomy. 7/9 - Code Stroke called ~1815 for acute onset of L-sided weakness/R gaze preference/dysarthria/AMS. CT Head NAICA. TNK given 1851. CTA Head/Neck +abrupt occlusion of R MCA M1 segment. Taken to [facility]. Revascularization completed but unfortunately repeatedly reoccluded. No stent due to increased risk of ICH with DAPT. Left intubated. PCCM consulted. 7/10 extubated  Interim History / Subjective:   Has done well since extubation overnight Afebrile On room air Did not clear swallow evaluation  Objective:  Blood pressure (!) 153/53, pulse 74, temperature 98.6 F (37 C), temperature source Axillary, resp. rate 16, height 5' 1 (1.549 m), weight 67.9 kg, SpO2 97%.        Intake/Output Summary (Last 24 hours) at 06/27/2024 0957 Last data filed at 06/27/2024 0800 Gross per 24 hour  Intake 1152.59 ml  Output 800 ml  Net 352.59 ml  Filed Weights   06/21/24 1525 06/21/24 2052  Weight: 67.1 kg 67.9 kg   Physical Examination: General: Acute on chronic ill-appearing deconditioned elderly female no distress HEENT: ETT,  MM pink/moist, PERRL,  Neuro: Left hemiplegia, follows commands and able to speak CV: s1s2 regular rate and rhythm, no murmur, rubs, or gallops,  PULM: Clear to auscultation bilaterally, no accessory muscle use GI: soft, bowel sounds active in all 4 quadrants, non-tender, non-distended Extremities: warm/dry, no edema  Skin: no rashes or lesions  Labs show hypokalemia and low albumin 2.7, mild leukocytosis  Resolved Hospital Problem List:   Post-procedure respiratory insufficiency requiring MV  Assessment & Plan:  Acute R MCA M1 occlusion -CT Head NAICA. CTA Head/Neck +abrupt occlusion of R MCA M1 segment. MRI shows small volume SAH P: Primary management as per stroke team Secondary stroke prevention SBP goal 120-160 PT/OT Core track until reevaluated by swallow therapist Discontinue arterial line   COPD with emphysema History of OSA P: Triple therapy nebs  E. coli bacteremia  -Most likely source is gallbladder, per chart review it appears patient denied any urinary sources but unfortunately urine culture was never sent -It appears patient has underwent extensive workup all of which are inconclusive for acute cholecystitis however clinically data supports diagnosis.  General surgery has evaluated patient and has not recommended any surgical interventions P: Continue ceftriaxone  Trend CBC and fever curve  PAD HLD P: Continuous telemetry Continue aspirin  and statin Optimize electrolytes   R breast CA -IDC, stage 1a s/p lumpectomy/XRT/hormone therapy, in remission. P: Outpatient follow-up  PCCM will be available as needed  Best Practice: (right click and Reselect all SmartList Selections daily)   Diet/type: NPO DVT prophylaxis: SCDs GI prophylaxis: PPI Lines: N/A Foley:  Yes, and it is still needed Code Status:  full code Last date of multidisciplinary goals of care discussion [Per Primary Team]    Harden Staff MD. FCCP.  Pulmonary & Critical  care Pager : [phone]  If no response to pager , please call [phone] until 7 pm After 7:00 pm call Elink  [phone]   06/27/2024  06/27/2024, 9:57 AM

## 2024-06-27 NOTE — Procedures (Signed)
 Cortrak  Person Inserting Tube:  Shivani Barrantes, Kerstin Peeling, RD Tube Type:  Cortrak - 43 inches Tube Size:  10 Tube Location:  Left nare Initial Placement:  Stomach Secured by: Bridle Technique Used to Measure Tube Placement:  Marking at nare/corner of mouth Cortrak Secured At:  65 cm   Cortrak Tube Team Note:  Consult received to place a Cortrak feeding tube.   No x-ray is required. RN may begin using tube.   If the tube becomes dislodged please keep the tube and contact the Cortrak team at [email or web address] for replacement.  If after hours and replacement cannot be delayed, place a NG tube and confirm placement with an abdominal x-ray.   Frederik Jansky, RD Registered Dietitian  See Amion for more information

## 2024-06-27 NOTE — TOC Progression Note (Signed)
 Transition of Care [facility]) - Progression Note    Patient Details  Name: [name] MRN: [id number] Date of Birth: 05-07-1932  Transition of Care [facility]) CM/SW Contact  Inocente GORMAN Kindle, LCSW Phone Number: 06/27/2024, 2:05 PM  Clinical Narrative:    CSW met with patient's daughter, [name], at bedside. She stated her sister (there are four) requested she speak with CSW about rehab placement. She expressed understanding of PT recommendation and is agreeable to SNF placement at time of discharge. CSW discussed insurance authorization process and will provide Medicare SNF ratings list. CSW will send out referrals for review and provide bed offers as available once Cortrak is removed.   Skilled Nursing Rehab Facilities-   [email or web address]   Ratings out of 5 stars (5 the highest)  Name Address  Phone # Quality Care Staffing Health Inspection Overall  [facility] [address] [place] [phone] 3 1 4 3   [facility] [address], [place] [phone] 5 2 4 5   [facility] [address], [place] [phone] 2 1 1 1   [facility] [address] [place] [phone] 4 3 4 4   [facility]  [address], [place] [phone] 5 3 5 5   [facility] [address] [facility] [phone] 5 3 2 3   [facility] [address], [place] [phone] 5 1 2 2   [facility] [phone] [address], [place] [phone] 2 3 4 4   [address] ([place]) [address] [place] [phone] 1 3 3 2   [facility] [address] [place], [place] [phone] 3 1 2 1   [facility]) [address], [place] [phone] 3 1 1 1   Clotilda Pereyra [address] [place] [phone] 3 3 4 4   [facility] [address] [place] [phone] 4 1 2 1   [facility] ([facility]) [address], [place] [phone] 1 1 4 2           [facility] [address], [place] [phone] 3 1 5 4   [facility] [address] [place] [phone] 4 1 1 1   [facility]  [address] [place] [phone] 2 3 1 1   [facility] [address] [phone] 2 2 4 4   [facility] [address], [place] [phone] 2 2 3 3           [facility] [address], [place] [phone] 2 1 2 1   [facility] (No [facility]) [address], [place] [phone] 5 4 4 5   [facility] [address], [facility] [phone] 3 4 4 4   [facility] [address], [place] [phone] 3 1 2 1   [facility] [address] [place] [phone] 3 2 2 2   [facility] [address], [place] [phone] 1 3 3 2   [facility] [address], [place] [phone] 2 2 3 3   [facility] [address] [place], [place] [phone] 2 1 4 3   [facility] [address], [place] [facility] [phone] 2 1 1 1   [facility] [address] [place], [place] [phone] 2 1 2 1   [facility] [address] [place] [phone] 3 1 2 1           [facility] [address], [place] [phone] 4 1 2 1   [facility] [address], [place]  [phone] 3 4 4 4   [facility] (No [facility])  [address], [place] [phone] 3 2 5 5   [facility] [address], [place] [phone] 2 2 3 3   [facility] [address] [place] [phone] 5 3 5 5   [facility] [address], [place] [phone] 2 1 1 1   [facility] [address] [place], [place] [phone] 3 5 5 5           [facility] [address] [place], [place] [phone] 5 4 5 5   [facility])  [address] [place] [phone] 1 1 2 1   [facility]) [address], [place] [phone]  2 4 4   [facility] [address], [place]  [phone] 3 5 5 5   [address] [place], [place] [phone] 4 2 2 2   [facility]) [address] [place] [phone] 1 1 3 1   [facility] [address] [place] [phone] 2 2 2 2       Expected Discharge Plan: Skilled Nursing Facility Barriers to Discharge: Continued Medical Work up, English as a second language teacher, SNF Pending bed offer  Expected Discharge Plan and Services   Discharge Planning Services: CM Consult Post Acute Care Choice: Skilled Nursing Facility Living arrangements for the past 2 months: Single Family Home                           HH Arranged: RN, OT, PT, Nurse's Aide HH Agency: [facility] Date [facility] Agency Contacted: 06/25/24 Time HH Agency Contacted: 1007 Representative spoke with at [facility] Agency: Darleene   Social Determinants of Health (SDOH) Interventions SDOH Screenings   Food Insecurity: No Food Insecurity (06/22/2024)  Housing: Low Risk  (06/22/2024)  Transportation Needs: No Transportation Needs (06/22/2024)  Utilities: Not At Risk (06/22/2024)  Social Connections: Socially Isolated (06/22/2024)  Tobacco Use: Medium Risk (06/21/2024)    Readmission Risk Interventions     No data to display

## 2024-06-27 NOTE — Progress Notes (Signed)
 Physical Therapy Treatment Patient Details Name: [name] MRN: [id number] DOB: [birth year] Today's Date: 06/27/2024   History of Present Illness Pt is an 89 y.o. female presenting 7/5 with RUQ abdominal pain radiating to back, nausea. Found to have suspected cholecystitis, hyponatremia, bacteriuria, hematuria. [facility] ED visit 7/3 Lab work demonstrates acute liver injury but pt leaving without being seen. 7/09 R MCA occlusion; received TNK; to IR with unsuccessful revascularization (repeated reocclusion); remained intubated until 7/10;  PMH: COPD, PAD, HLD, OSA, breast cancer.    PT Comments  Pt drowsy, states you all don't let me sleep here. Pt requiring repeated cuing, but does consistently follow one-step commands. Pt presenting with L hemiparesis vs plegia (1/5 throughout LLE), L inattention and does not cross midline, impaired sitting balance, and decreased activity tolerance vs anticipated baseline. Pt requiring max +2 assist for to/from EOB, declines attempt at standing stating she is too fatigued. Plan for post-acute inpatient rehabilitation <3 hours/day remains appropriate.     If plan is discharge home, recommend the following: Assist for transportation;Supervision due to cognitive status;Two people to help with walking and/or transfers;Assistance with cooking/housework;Assistance with feeding;Direct supervision/assist for medications management;Direct supervision/assist for financial management;Help with stairs or ramp for entrance   Can travel by private vehicle        Equipment Recommendations  Wheelchair (measurements PT);Wheelchair cushion (measurements PT);Hoyer lift (has RW)    Recommendations for Other Services       Precautions / Restrictions Precautions Precautions: Fall Precaution/Restrictions Comments: Cortrak Restrictions Weight Bearing Restrictions Per Provider Order: No     Mobility  Bed Mobility Overal bed mobility: Needs Assistance Bed Mobility: Supine  to Sit, Sit to Supine, Rolling Rolling: Min assist, Max assist, Used rails   Supine to sit: Max assist, +2 for physical assistance Sit to supine: Max assist, +2 for physical assistance   General bed mobility comments: assist for trunk and LE management, cues for sequencing. rolls better towards L because using RUE to assist    Transfers                   General transfer comment: too lethargic    Ambulation/Gait                   Stairs             Wheelchair Mobility     Tilt Bed    Modified Rankin (Stroke Patients Only) Modified Rankin (Stroke Patients Only) Pre-Morbid Rankin Score: Moderate disability Modified Rankin: Severe disability     Balance Overall balance assessment: Needs assistance Sitting-balance support: Feet supported Sitting balance-Leahy Scale: Poor Sitting balance - Comments: min-mod truncal assist to maintain upright       Standing balance comment: nt given lethargy                            Communication Communication Communication: Impaired Factors Affecting Communication: Reduced clarity of speech  Cognition Arousal: Lethargic Behavior During Therapy: Flat affect   PT - Cognitive impairments: Attention, Problem solving, Safety/Judgement                       PT - Cognition Comments: pt requiring repeated cues at times to follow commands, but does follow commands well. pt slightly irritable, but anticipate this is baseline   Following commands impaired: Follows one step commands with increased time (with repeated cuing at times)    Cueing Cueing Techniques:  Verbal cues, Tactile cues  Exercises      General Comments General comments (skin integrity, edema, etc.): BP and other VSS      Pertinent Vitals/Pain Pain Assessment Pain Assessment: Faces Faces Pain Scale: Hurts a little bit Pain Location: generalized with mobility Pain Descriptors / Indicators: Grimacing, Discomfort Pain  Intervention(s): Limited activity within patient's tolerance, Monitored during session    Home Living                          Prior Function            PT Goals (current goals can now be found in the care plan section) Acute Rehab PT Goals Patient Stated Goal: rest PT Goal Formulation: With patient/family Time For Goal Achievement: 07/10/24 Potential to Achieve Goals: Good Progress towards PT goals: Progressing toward goals    Frequency    Min 2X/week      PT Plan      Co-evaluation   Reason for Co-Treatment: Complexity of the patient's impairments (multi-system involvement)   OT goals addressed during session: ADL's and self-care;Strengthening/ROM      AM-PAC PT 6 Clicks Mobility   Outcome Measure  Help needed turning from your back to your side while in a flat bed without using bedrails?: A Lot Help needed moving from lying on your back to sitting on the side of a flat bed without using bedrails?: Total Help needed moving to and from a bed to a chair (including a wheelchair)?: Total Help needed standing up from a chair using your arms (e.g., wheelchair or bedside chair)?: Total Help needed to walk in hospital room?: Total Help needed climbing 3-5 steps with a railing? : Total 6 Click Score: 7    End of Session   Activity Tolerance: Patient limited by fatigue Patient left: with call bell/phone within reach;with family/visitor present;in bed;with bed alarm set;with nursing/sitter in room Nurse Communication: Mobility status PT Visit Diagnosis: Unsteadiness on feet (R26.81);Hemiplegia and hemiparesis Hemiplegia - Right/Left: Left Hemiplegia - dominant/non-dominant: Non-dominant Hemiplegia - caused by: Cerebral infarction     Time: 9052-8986 PT Time Calculation (min) (ACUTE ONLY): 26 min  Charges:    $Neuromuscular Re-education: 8-22 mins PT General Charges $$ ACUTE PT VISIT: 1 Visit                     Johana RAMAN, PT DPT Acute  Rehabilitation Services Secure Chat Preferred  Office [phone]    Harrol Novello FORBES [place] 06/27/2024, 12:13 PM

## 2024-06-27 NOTE — Progress Notes (Addendum)
 STROKE TEAM PROGRESS NOTE   BRIEF HPI [name] is a 89 y.o. female with history of COPD, PAD, HLD, OSA, right breast carcinoma stage Ia s/p lumpectomy, radiation, and hormone therapy in remission admitted initially on 7/5 with RUQ abdominal pain, nausea, vomiting with initial workup revealing elevated LFTs and possible cholecystitis/E. coli bacteremia s/p 5 days of ceftriaxone .  Hospital course complicated by hallucinations, encephalopathy, some difficulty breathing treated with diuretics.  While patient's daughter was visiting her in the hospital yesterday, the patient developed a sudden onset of left-sided weakness with a right gaze deviation and slurred speech.  SIGNIFICANT HOSPITAL EVENTS 7/5: Admitted for RUQ pain, nausea, vomiting - Hospitalization complicated by encephalopathy, hallucinations - E. Coli bacteremia treated with 5 days of ceftriaxone  7/9:  - 18:15 daughter witnessed patient with sudden onset slurred speech, left-sided weakness, and right gaze deviation for which a Code Stroke was activated - TNKase  administered (no contraindication per primary team and surgery) - CTH with right MCA M1 occlusion, taken to IR for thrombectomy with TICI 2B revacaularization - Remained intubated for airway protection and admitted to the ICU overnight   - Sedation paused this morning with some prolonged sedating effects   INTERIM HISTORY/SUBJECTIVE Family at the bedside. Therapy is also at the bedside working with her.  She had coretrak tube placed this morning.  K 3.3 and has been replaced. WBC 13.5, afebrile  Will start asa this morning.   CBC    Component Value Date/Time   WBC 13.5 (H) 06/27/2024 0530   RBC 3.20 (L) 06/27/2024 0530   HGB 10.0 (L) 06/27/2024 0530   HGB 13.2 05/05/2024 1343   HCT 29.9 (L) 06/27/2024 0530   PLT 216 06/27/2024 0530   PLT 150 05/05/2024 1343   MCV 93.4 06/27/2024 0530   MCH 31.3 06/27/2024 0530   MCHC 33.4 06/27/2024 0530   RDW 13.2  06/27/2024 0530   LYMPHSABS 0.8 06/26/2024 0530   MONOABS 0.9 06/26/2024 0530   EOSABS 0.2 06/26/2024 0530   BASOSABS 0.1 06/26/2024 0530   BMET    Component Value Date/Time   NA 136 06/27/2024 0530   K 3.3 (L) 06/27/2024 0530   CL 103 06/27/2024 0530   CO2 23 06/27/2024 0530   GLUCOSE 106 (H) 06/27/2024 0530   BUN 5 (L) 06/27/2024 0530   CREATININE 0.53 06/27/2024 0530   CREATININE 0.74 05/05/2024 1343   CALCIUM  7.8 (L) 06/27/2024 0530   GFRNONAA >60 06/27/2024 0530   GFRNONAA >60 05/05/2024 1343   Lab Results  Component Value Date   HGBA1C 5.1 06/26/2024    Lab Results  Component Value Date   CHOL 96 06/26/2024   HDL 35 (L) 06/26/2024   LDLCALC 46 06/26/2024   TRIG 74 06/26/2024   TRIG 74 06/26/2024   CHOLHDL 2.7 06/26/2024   Drugs of Abuse  No results found for: LABOPIA, COCAINSCRNUR, LABBENZ, AMPHETMU, THCU, LABBARB   Urinalysis    Component Value Date/Time   COLORURINE YELLOW 06/21/2024 1532   APPEARANCEUR CLEAR 06/21/2024 1532   LABSPEC 1.013 06/21/2024 1532   PHURINE 7.0 06/21/2024 1532   GLUCOSEU NEGATIVE 06/21/2024 1532   HGBUR MODERATE (A) 06/21/2024 1532   BILIRUBINUR NEGATIVE 06/21/2024 1532   KETONESUR NEGATIVE 06/21/2024 1532   PROTEINUR NEGATIVE 06/21/2024 1532   UROBILINOGEN 1.0 11/01/2007 1614   NITRITE POSITIVE (A) 06/21/2024 1532   LEUKOCYTESUR NEGATIVE 06/21/2024 1532   IMAGING past 24 hours MR ANGIO HEAD WO CONTRAST Result Date: 06/27/2024 CLINICAL DATA:  Follow-up  examination for acute stroke, status post revascularization. EXAM: MRI HEAD WITHOUT CONTRAST MRA HEAD WITHOUT CONTRAST TECHNIQUE: Multiplanar, multi-echo pulse sequences of the brain and surrounding structures were acquired without intravenous contrast. Angiographic images of the Circle of Willis were acquired using MRA technique without intravenous contrast. COMPARISON:  Comparison made with prior studies from 06/25/2024 FINDINGS: MRI HEAD FINDINGS Brain: Examination  mildly degraded by motion artifact. Generalized age-related cerebral atrophy. Patchy and confluent T2/FLAIR hyperintensity involving the periventricular deep white matter both cerebral hemispheres, consistent with chronic small vessel ischemic disease, moderately advanced in nature. Patchy and confluent restricted diffusion involving the right cerebral hemisphere, with involvement of the right frontal, parietal, and temporal lobes, consistent with an evolving right MCA territory infarct. Mild associated petechial blood products near the right frontoparietal region (series 29, image 32). Additionally, susceptibility artifact at the base of the sylvian fissure consistent with small volume acute subarachnoid hemorrhage (series 29, image 29). This was also seen on intra procedural head CT. No significant regional mass effect. No other evidence for acute or subacute ischemia. No other areas of chronic cortical infarction. No other acute or chronic intracranial blood products. No mass lesion or midline shift. No hydrocephalus or extra-axial fluid collection. Pituitary gland within normal limits. Vascular: Major intracranial vascular flow voids are maintained. Skull and upper cervical spine: Craniocervical junction within normal limits. Bone marrow signal intensity within normal limits. No scalp soft tissue abnormality. Sinuses/Orbits: Right gaze preference noted. Prior bilateral ocular lens replacement. Paranasal sinuses are largely clear. Trace bilateral mastoid effusions noted of doubtful significance. Other: None. MRA HEAD FINDINGS Anterior circulation: Examination mildly degraded by motion artifact. Both internal carotid arteries remain patent through the siphons without stenosis or other abnormality. A1 segments, anterior communicating artery complex, and anterior cerebral arteries patent without stenosis. Left M1 segment and distal left MCA branches are widely patent and well perfused. Right M1 segment widely patent  proximally, but becomes increasing attenuated as it courses towards the bifurcation. There is persistent and/or recurrent proximal right M2 occlusion, superior division (series 8, image 125). Overall appearance is similar to prior CTA. Remainder of the right MCA branches are somewhat attenuated but remain patent. Distal small vessel atheromatous irregularity noted. Posterior circulation: Right vertebral artery dominant and widely patent to the vertebrobasilar junction. Left vertebral artery hypoplastic and patent without visible stenosis. Partially visualized PICA are grossly patent. Basilar patent without stenosis. Superior cerebellar and posterior cerebral arteries patent bilaterally. Distal small vessel atheromatous irregularity noted. Anatomic variants: As above.  No visible aneurysm. IMPRESSION: MRI HEAD: 1. Evolving right MCA territory infarct. Mild associated petechial blood products at the right frontoparietal region. No significant regional mass effect. 2. Small volume acute subarachnoid hemorrhage at the base of the right Sylvian fissure. This was also seen on intra procedural head CT from 06/25/2024. 3. Underlying age-related cerebral atrophy with moderate chronic microvascular ischemic disease. MRA HEAD: 1. Persistent and/or recurrent proximal right M2 occlusion, superior division. Overall appearance is similar to prior CTA. 2. Otherwise wide patency of the intracranial arterial circulation. No other hemodynamically significant or correctable stenosis. Electronically Signed   By: Morene Hoard M.D.   On: 06/27/2024 02:52   MR BRAIN WO CONTRAST Result Date: 06/27/2024 CLINICAL DATA:  Follow-up examination for acute stroke, status post revascularization. EXAM: MRI HEAD WITHOUT CONTRAST MRA HEAD WITHOUT CONTRAST TECHNIQUE: Multiplanar, multi-echo pulse sequences of the brain and surrounding structures were acquired without intravenous contrast. Angiographic images of the Circle of Willis were  acquired using MRA technique without  intravenous contrast. COMPARISON:  Comparison made with prior studies from 06/25/2024 FINDINGS: MRI HEAD FINDINGS Brain: Examination mildly degraded by motion artifact. Generalized age-related cerebral atrophy. Patchy and confluent T2/FLAIR hyperintensity involving the periventricular deep white matter both cerebral hemispheres, consistent with chronic small vessel ischemic disease, moderately advanced in nature. Patchy and confluent restricted diffusion involving the right cerebral hemisphere, with involvement of the right frontal, parietal, and temporal lobes, consistent with an evolving right MCA territory infarct. Mild associated petechial blood products near the right frontoparietal region (series 29, image 32). Additionally, susceptibility artifact at the base of the sylvian fissure consistent with small volume acute subarachnoid hemorrhage (series 29, image 29). This was also seen on intra procedural head CT. No significant regional mass effect. No other evidence for acute or subacute ischemia. No other areas of chronic cortical infarction. No other acute or chronic intracranial blood products. No mass lesion or midline shift. No hydrocephalus or extra-axial fluid collection. Pituitary gland within normal limits. Vascular: Major intracranial vascular flow voids are maintained. Skull and upper cervical spine: Craniocervical junction within normal limits. Bone marrow signal intensity within normal limits. No scalp soft tissue abnormality. Sinuses/Orbits: Right gaze preference noted. Prior bilateral ocular lens replacement. Paranasal sinuses are largely clear. Trace bilateral mastoid effusions noted of doubtful significance. Other: None. MRA HEAD FINDINGS Anterior circulation: Examination mildly degraded by motion artifact. Both internal carotid arteries remain patent through the siphons without stenosis or other abnormality. A1 segments, anterior communicating artery  complex, and anterior cerebral arteries patent without stenosis. Left M1 segment and distal left MCA branches are widely patent and well perfused. Right M1 segment widely patent proximally, but becomes increasing attenuated as it courses towards the bifurcation. There is persistent and/or recurrent proximal right M2 occlusion, superior division (series 8, image 125). Overall appearance is similar to prior CTA. Remainder of the right MCA branches are somewhat attenuated but remain patent. Distal small vessel atheromatous irregularity noted. Posterior circulation: Right vertebral artery dominant and widely patent to the vertebrobasilar junction. Left vertebral artery hypoplastic and patent without visible stenosis. Partially visualized PICA are grossly patent. Basilar patent without stenosis. Superior cerebellar and posterior cerebral arteries patent bilaterally. Distal small vessel atheromatous irregularity noted. Anatomic variants: As above.  No visible aneurysm. IMPRESSION: MRI HEAD: 1. Evolving right MCA territory infarct. Mild associated petechial blood products at the right frontoparietal region. No significant regional mass effect. 2. Small volume acute subarachnoid hemorrhage at the base of the right Sylvian fissure. This was also seen on intra procedural head CT from 06/25/2024. 3. Underlying age-related cerebral atrophy with moderate chronic microvascular ischemic disease. MRA HEAD: 1. Persistent and/or recurrent proximal right M2 occlusion, superior division. Overall appearance is similar to prior CTA. 2. Otherwise wide patency of the intracranial arterial circulation. No other hemodynamically significant or correctable stenosis. Electronically Signed   By: Morene Hoard M.D.   On: 06/27/2024 02:52   Vitals:   06/27/24 1000 06/27/24 1022 06/27/24 1100 06/27/24 1200  BP: (!) 141/70 (!) 146/105 (!) 137/52 134/66  Pulse: 77 76 74 72  Resp: 18 18 18 18   Temp:    99 F (37.2 C)  TempSrc:     Axillary  SpO2: 92% 95% 95% 95%  Weight:      Height:       PHYSICAL EXAM General:  Critically ill appearing, elderly, Caucasian female laying in ICU bed CV: Sinus bradycardia with rate in the 50's Respiratory: on [place] in NAD  GI: Abdomen soft and nontender  NEURO:  Mental Status: awake and alert.  She is oriented to self, age, month situation and place. She can follow commands.   Cranial Nerves:  II: PERRL.  left hemianopsia, no blink to threat  III, IV, VI: Right gaze preference, will track towards midline but will not cross midline towards the left V: Does not respond to light touch on either side of the face VII: left facial droop  VIII: Hearing is intact to voice. IX, X: cough present  XI: Head is grossly midline XII: midline Motor: Left upper extremity is flaccid with withdrawal of the left lower extremity. Right arm and right leg spontaneous movement Sensation: decreased on left  Coordination: unable to assess Gait: Deferred  ASSESSMENT/PLAN Stroke:  right MCA territory infarct with R M1 occlusion s/p TNK and IR with TICI 2B initially but persistent occlusion eventually, etiology:  most likely large vessel disease    Code Stroke CT head No CT evidence of acute intracranial abnormality. ASPECTS is 10. CTA head & neck Abrupt occlusion of the distal M1 segment of the right middle cerebral artery S/p IR with repeated  reocclusion secondary to underlying long segment atherosclerotic plaque and final score TICI2b Post IR CT small focal right perisylvian subarachnoid hemorrhage. MRI brain  Evolving right MCA territory infarct. Mild associated petechial blood products at the right frontoparietal region. No significant regional mass effect. 2. Small volume acute subarachnoid hemorrhage at the base of the right Sylvian fissure.   MRA Persistent and/or recurrent proximal right M2 occlusion, superior division.  2D Echo LVEF 60-65%, mild atrial dilation LDL 46 HgbA1c 5.1 VTE  prophylaxis - heparin  subq aspirin  81 mg daily prior to admission, now on aspirin  81 mg daily given [facility] Therapy recommendations:  SNF Disposition:  Pending  ? Hx of Stroke/TIA Acute onset of vertigo and gait disturbance in June 2018 with residual truncal ataxia requiring use of walker for ambulation  Concern for possible brainstem/cerebellar stroke at that time  Recommended for MRI evaluation at that time but this was lost to follow up   Acute Acalculous Cholecystitis E. Coli bacteremia Elevated LFTs, resolving Admitted for right upper quadrant pain and nausea vomiting. 1/4 blood cultures + for E. Coli S/p Flagyl  Continue Ceftriaxone  for 10 days per CCM  AST/ALT admission 1658/958 -> ...-> 25/57->37/60 Less likely endocarditis given the bacteria species  HTN Home meds:  None Stable Low dose cleviprex  started this morning for blood pressure control Add PRN labetalol , hydralazine  for BP control and titrate off cleviprex  gtt Blood pressure goal SBP <160 given SAH Long term BP goal: Normotension  Hyperlipidemia Home meds:  atorvastatin  10 mg LDL 46, goal < 70 Held inpatient due to elevated LFTs High intensity statin not indicated as patient is at goal at lower dose Consider resume statin once po access and LFT normalizes  Dysphagia Patient has post-stroke dysphagia SLP consult Did not pass swallow, Currently n.p.o. S/p cortrak  Now on TF @ 45  Post-op Respiratory Insufficiency COPD  Was concerning for fluid overload status post diuretics CCM management appreciated Was on peripheral and fentanyl , now off Wean sedation  SBT per CCM Extubated 7/10, tolerating well  Other Stroke Risk Factors Advanced age Family hx stroke (possible stroke versus MI in patient's father) Remote tobacco use history 56-pack-year history, stopped smoking 8 years ago CAD/PAD Obstructive sleep apnea, on CPAP at home though reported not using effectively PTA  Other Active  Problems Hypomagnesemia, Will replace and recheck in AM Anemia, Hbg 13.8 on admission > 10.6 Depression on  Lexapro   Hospital day # 6   Karna Geralds DNP, ACNPC-AG  [facility]  ATTENDING NOTE: I reviewed above note and agree with the assessment and plan. Pt was seen and examined.   Daughter and PT/OT are at the bedside. Pt lying in bed, lethargic, eyes closed but alert, eyes open intermittently, orientated to place and people, did not state her name or time. No aphasia but paucity of speech, following all simple commands but psychomotor slowing. Able to name 2/2 in delayed fashion and not cooperative with repeat. No gaze palsy, tracking bilaterally, visual field full. Left mild facial droop. Tongue protrusion not cooperative. LUE flaccid, LLE withdraw to pain with babinski. Sensation, coordination not cooperative and gait not tested.   For detailed assessment and plan, please refer to above as I have made changes wherever appropriate.   Ary Cummins, MD PhD Stroke Neurology 06/27/2024 9:47 PM    This patient is critically ill due to right MCA stroke, bacteremia, cholecystitis, intracranial stenosis, SAH and at significant risk of neurological worsening, death form cerebral edema, sepsis, recurrent stroke, hemorrhagic admission and vasospasm and seizure. This patient's care requires constant monitoring of vital signs, hemodynamics, respiratory and cardiac monitoring, review of multiple databases, neurological assessment, discussion with family, other specialists and medical decision making of high complexity. I spent 40 minutes of neurocritical care time in the care of this patient. I had long discussion with daughter at bedside, updated pt current condition, treatment plan and potential prognosis, and answered all the questions. She expressed understanding and appreciation.    To contact Stroke Continuity provider, please refer to [email or web address]. After hours, contact General  Neurology

## 2024-06-27 NOTE — Progress Notes (Signed)
 Occupational Therapy Evaluation Patient Details Name: NANETTE WIRSING MRN: [id number] DOB: Nov 02, 1932 Today's Date: 06/27/2024   History of Present Illness   Pt is an 89 y.o. female presenting 7/5 with RUQ abdominal pain radiating to back, nausea. Found to have suspected cholecystitis, hyponatremia, bacteriuria, hematuria. [facility] ED visit 7/3 Lab work demonstrates acute liver injury but pt leaving without being seen. 7/09 R MCA occlusion; received TNK; to IR with unsuccessful revascularization (repeated reocclusion); remained intubated until 7/10;  PMH: COPD, PAD, HLD, OSA, breast cancer.     Clinical Impressions Patient with new CVA, reassessed with deficits noted compared to original eval.  Up to Max A of 2 for simple mobility and Min A to near total A for ADL completion at bedlevel.  POC updated, probably will need a vision/attention goal once more alert.  OT will continue efforts in the acute setting to address deficits.  May need a L hand resting hand splint.  Patient will benefit from continued inpatient follow up therapy, <3 hours/day.     If plan is discharge home, recommend the following:   Assist for transportation;Assistance with cooking/housework;Two people to help with walking and/or transfers;A lot of help with bathing/dressing/bathroom     Functional Status Assessment         Equipment Recommendations   Wheelchair cushion (measurements OT);Wheelchair (measurements OT);Hospital bed     Recommendations for Other Services         Precautions/Restrictions   Precautions Precautions: Fall Precaution/Restrictions Comments: Cortrak Restrictions Weight Bearing Restrictions Per Provider Order: No     Mobility Bed Mobility Overal bed mobility: Needs Assistance Bed Mobility: Supine to Sit, Sit to Supine     Supine to sit: Max assist, +2 for physical assistance Sit to supine: Max assist, +2 for physical assistance        Transfers                    General transfer comment: too lethargic      Balance Overall balance assessment: Needs assistance Sitting-balance support: Feet supported Sitting balance-Leahy Scale: Zero                                     ADL either performed or assessed with clinical judgement   ADL   Eating/Feeding: NPO   Grooming: Wash/dry hands;Wash/dry face;Maximal assistance;Bed level   Upper Body Bathing: Maximal assistance;Bed level   Lower Body Bathing: Total assistance;Bed level   Upper Body Dressing : Maximal assistance;Bed level   Lower Body Dressing: Total assistance;Bed level       Toileting- Clothing Manipulation and Hygiene: Total assistance;Bed level               Vision   Vision Assessment?: Vision impaired- to be further tested in functional context Additional Comments: R gaze, not crossing midline     Perception Perception: Impaired Preception Impairment Details: Inattention/Neglect     Praxis Praxis: Impaired Praxis Impairment Details: Initiation     Pertinent Vitals/Pain Pain Assessment Pain Assessment: Faces Faces Pain Scale: Hurts a little bit Pain Location: generalized with mobility Pain Descriptors / Indicators: Grimacing Pain Intervention(s): Monitored during session     Extremity/Trunk Assessment Upper Extremity Assessment Upper Extremity Assessment: Right hand dominant;RUE deficits/detail;LUE deficits/detail RUE Deficits / Details: Grossly 3+/5 MMT RUE Sensation: WNL RUE Coordination: WNL LUE Deficits / Details: No AROM with increased tone noted throughout LUE Sensation: decreased light touch;decreased proprioception  LUE Coordination: decreased fine motor;decreased gross motor   Lower Extremity Assessment Lower Extremity Assessment: Defer to PT evaluation       Communication Communication Communication: Impaired Factors Affecting Communication: Reduced clarity of speech   Cognition Arousal: Lethargic Behavior During Therapy:  Flat affect Cognition: No apparent impairments                                 Following commands impaired: Only follows one step commands consistently      Prior Functioning/Environment                      OT Problem List:     OT Treatment/Interventions:        OT Goals(Current goals can be found in the care plan section)   Acute Rehab OT Goals OT Goal Formulation: With patient Time For Goal Achievement: 07/11/24 Potential to Achieve Goals: Good ADL Goals Pt Will Perform Grooming: with min assist;sitting Pt Will Perform Upper Body Bathing: with min assist;sitting Pt Will Perform Upper Body Dressing: with min assist;sitting Pt Will Perform Lower Body Dressing: with mod assist;sitting/lateral leans Pt Will Transfer to Toilet: with min assist;with +2 assist;stand pivot transfer;bedside commode   OT Frequency:  Min 2X/week    Co-evaluation PT/OT/SLP Co-Evaluation/Treatment: Yes Reason for Co-Treatment: Complexity of the patient's impairments (multi-system involvement)   OT goals addressed during session: ADL's and self-care;Strengthening/ROM      AM-PAC OT 6 Clicks Daily Activity     Outcome Measure Help from another person eating meals?: Total Help from another person taking care of personal grooming?: A Lot Help from another person toileting, which includes using toliet, bedpan, or urinal?: A Lot Help from another person bathing (including washing, rinsing, drying)?: A Lot Help from another person to put on and taking off regular upper body clothing?: A Lot Help from another person to put on and taking off regular lower body clothing?: A Lot 6 Click Score: 11   End of Session    Activity Tolerance: Patient limited by lethargy Patient left: in bed;with bed alarm set;with family/visitor present  OT Visit Diagnosis: Unsteadiness on feet (R26.81);Muscle weakness (generalized) (M62.81);Hemiplegia and hemiparesis Hemiplegia - Right/Left:  Left Hemiplegia - dominant/non-dominant: Non-Dominant Hemiplegia - caused by: Cerebral infarction                Time: 9051-8984 OT Time Calculation (min): 27 min Charges:  OT General Charges $OT Visit: 1 Visit OT Evaluation $OT Re-eval: 1 Re-eval  06/27/2024  RP, OTR/L  Acute Rehabilitation Services  Office:  [phone]   Charlie JONETTA Halsted 06/27/2024, 10:25 AM

## 2024-06-28 DIAGNOSIS — I63511 Cerebral infarction due to unspecified occlusion or stenosis of right middle cerebral artery: Secondary | ICD-10-CM | POA: Diagnosis not present

## 2024-06-28 DIAGNOSIS — I69391 Dysphagia following cerebral infarction: Secondary | ICD-10-CM | POA: Diagnosis not present

## 2024-06-28 DIAGNOSIS — Z87891 Personal history of nicotine dependence: Secondary | ICD-10-CM

## 2024-06-28 DIAGNOSIS — I609 Nontraumatic subarachnoid hemorrhage, unspecified: Secondary | ICD-10-CM | POA: Diagnosis not present

## 2024-06-28 DIAGNOSIS — I779 Disorder of arteries and arterioles, unspecified: Secondary | ICD-10-CM | POA: Diagnosis not present

## 2024-06-28 LAB — GLUCOSE, CAPILLARY
Glucose-Capillary: 115 mg/dL — ABNORMAL HIGH (ref 70–99)
Glucose-Capillary: 128 mg/dL — ABNORMAL HIGH (ref 70–99)
Glucose-Capillary: 138 mg/dL — ABNORMAL HIGH (ref 70–99)
Glucose-Capillary: 138 mg/dL — ABNORMAL HIGH (ref 70–99)
Glucose-Capillary: 142 mg/dL — ABNORMAL HIGH (ref 70–99)
Glucose-Capillary: 144 mg/dL — ABNORMAL HIGH (ref 70–99)

## 2024-06-28 LAB — PHOSPHORUS: Phosphorus: 2.5 mg/dL (ref 2.5–4.6)

## 2024-06-28 LAB — MAGNESIUM: Magnesium: 2.1 mg/dL (ref 1.7–2.4)

## 2024-06-28 MED ORDER — PHENOL 1.4 % MT LIQD
2.0000 | OROMUCOSAL | Status: DC | PRN
  Administered 2024-07-03: 2 via OROMUCOSAL
  Filled 2024-06-28: qty 177

## 2024-06-28 NOTE — Progress Notes (Signed)
 Pt admitted to rm 18 from 4N ICU. Initiated tele. Obtained VS. Oriented pt to the unit. Call bell within reach.   Amado GORMAN Arabia, RN

## 2024-06-28 NOTE — Progress Notes (Signed)
 Report given to 3W RN, no questions at this time still waiting for room cleaning, daughter at bedside updated on transfer as soon as bed is ready.

## 2024-06-28 NOTE — Progress Notes (Addendum)
 STROKE TEAM PROGRESS NOTE   BRIEF HPI [name] is a 89 y.o. female with history of COPD, PAD, HLD, OSA, right breast carcinoma stage Ia s/p lumpectomy, radiation, and hormone therapy in remission admitted initially on 7/5 with RUQ abdominal pain, nausea, vomiting with initial workup revealing elevated LFTs and possible cholecystitis/E. coli bacteremia s/p 5 days of ceftriaxone .  Hospital course complicated by hallucinations, encephalopathy, some difficulty breathing treated with diuretics.  While patient's daughter was visiting her in the hospital yesterday, the patient developed a sudden onset of left-sided weakness with a right gaze deviation and slurred speech.  SIGNIFICANT HOSPITAL EVENTS 7/5: Admitted for RUQ pain, nausea, vomiting - Hospitalization complicated by encephalopathy, hallucinations - E. Coli bacteremia treated with 5 days of ceftriaxone  7/9:  - 18:15 daughter witnessed patient with sudden onset slurred speech, left-sided weakness, and right gaze deviation for which a Code Stroke was activated - TNKase  administered (no contraindication per primary team and surgery) - CTH with right MCA M1 occlusion, taken to IR for thrombectomy with TICI 2B revacaularization - Remained intubated for airway protection and admitted to the ICU overnight   - Sedation paused this morning with some prolonged sedating effects   INTERIM HISTORY/SUBJECTIVE Granddaughter is at the bedside.  Patient is laying in bed in no apparent distress.  No new neurological events overnight Vitals remain stable.  Neurological exam unchanged  CBC    Component Value Date/Time   WBC 13.5 (H) 06/27/2024 0530   RBC 3.20 (L) 06/27/2024 0530   HGB 10.0 (L) 06/27/2024 0530   HGB 13.2 05/05/2024 1343   HCT 29.9 (L) 06/27/2024 0530   PLT 216 06/27/2024 0530   PLT 150 05/05/2024 1343   MCV 93.4 06/27/2024 0530   MCH 31.3 06/27/2024 0530   MCHC 33.4 06/27/2024 0530   RDW 13.2 06/27/2024 0530   LYMPHSABS 0.8  06/26/2024 0530   MONOABS 0.9 06/26/2024 0530   EOSABS 0.2 06/26/2024 0530   BASOSABS 0.1 06/26/2024 0530   BMET    Component Value Date/Time   NA 136 06/27/2024 0530   K 3.3 (L) 06/27/2024 0530   CL 103 06/27/2024 0530   CO2 23 06/27/2024 0530   GLUCOSE 106 (H) 06/27/2024 0530   BUN 5 (L) 06/27/2024 0530   CREATININE 0.53 06/27/2024 0530   CREATININE 0.74 05/05/2024 1343   CALCIUM  7.8 (L) 06/27/2024 0530   GFRNONAA >60 06/27/2024 0530   GFRNONAA >60 05/05/2024 1343   Lab Results  Component Value Date   HGBA1C 5.1 06/26/2024    Lab Results  Component Value Date   CHOL 96 06/26/2024   HDL 35 (L) 06/26/2024   LDLCALC 46 06/26/2024   TRIG 74 06/26/2024   TRIG 74 06/26/2024   CHOLHDL 2.7 06/26/2024   Drugs of Abuse  No results found for: LABOPIA, COCAINSCRNUR, LABBENZ, AMPHETMU, THCU, LABBARB   Urinalysis    Component Value Date/Time   COLORURINE YELLOW 06/21/2024 1532   APPEARANCEUR CLEAR 06/21/2024 1532   LABSPEC 1.013 06/21/2024 1532   PHURINE 7.0 06/21/2024 1532   GLUCOSEU NEGATIVE 06/21/2024 1532   HGBUR MODERATE (A) 06/21/2024 1532   BILIRUBINUR NEGATIVE 06/21/2024 1532   KETONESUR NEGATIVE 06/21/2024 1532   PROTEINUR NEGATIVE 06/21/2024 1532   UROBILINOGEN 1.0 11/01/2007 1614   NITRITE POSITIVE (A) 06/21/2024 1532   LEUKOCYTESUR NEGATIVE 06/21/2024 1532   IMAGING past 24 hours DG Swallowing Func-Speech Pathology Result Date: 06/27/2024 Table formatting from the original result was not included. Modified Barium Swallow Study Patient Details  Name: [name] MRN: [id number] Date of Birth: 1932/02/27 Today's Date: 06/27/2024 HPI/PMH: HPI: [name] is an 89 yo female presenting to ED 7/5 for RUQ pain, nausea/vomiting. Found to have suspected cholecystitis vs passed choledocholithiasis and acute liver injury, hyponatremia, bacteriuria, and hematuria. Initially seen at [facility] ED 7/3 with n/v and elevated LFTs but left without being seen. Daughter  noted sudden onset AMS and L sided weakness 7/9 with CTA showing abrupt occlusion of distal R MCA M1 s/p TNK. Taken to IR with unsuccessful revascularization (repeated reocclusion), post-procedure CTH showed small SAH. Remained intubated after the procedure, 7/9-7/10. PMH includes HLD, PAD, COPD with emphysema, OSA, R breast cancer, back pain, esophageal spasm Clinical Impression: Clinical Impression: Pt exhibits moderate oropharyngeal dyspahgia characterized by mistiming that may also be impacted by chronic factors including suspected cervical osteophytes and a prominent cricopharyngeus. She often achieves complete laryngeal elevation evidenced in the instances of swallow initiation occurring as the bolus reaches her vocal folds and is effectively expelled but this is inconsistent. Thin and nectar thick liquids are ultimately aspirated, and although she is sensate, her cough is not forceful enough to eject aspirates (PAS 7). Oral transit and lingual motion are slow with all consistencies, but most noticeably with honey thick liquids and purees as they inch through the mouth and pharynx, past the open laryngeal vestibule until she initiates a swallow at the level of the pyriform sinues. Dempsey penetration occurs with honey thick liquids occurs and goes unsensed (PAS 5). Residue consistently collects in the oral cavity and she cannot consistently follow commands to swallow again. The amount of cueing required to initiate use of compensatory strategies makes them ineffective. In addition to these impairments, her mentation is a significant barrier to having POs at this time. Recommend NPO except ice chips in moderation after oral care. SLP will continue following. Factors that may increase risk of adverse event in presence of aspiration [name] & [name] 2021): Factors that may increase risk of adverse event in presence of aspiration [name] & [name] 2021): Reduced cognitive function; Limited mobility; Presence of  tubes (ETT, trach, NG, etc.); Frequent aspiration of large volumes Recommendations/Plan: Swallowing Evaluation Recommendations Swallowing Evaluation Recommendations Recommendations: NPO; Ice chips PRN after oral care Medication Administration: Via alternative means Oral care recommendations: Oral care QID (4x/day); Oral care before ice chips/water Treatment Plan Treatment Plan Treatment recommendations: Therapy as outlined in treatment plan below Follow-up recommendations: Skilled nursing-short term rehab (<3 hours/day) Functional status assessment: Patient has had a recent decline in their functional status and demonstrates the ability to make significant improvements in function in a reasonable and predictable amount of time. Treatment frequency: Min 2x/week Treatment duration: 2 weeks Interventions: Aspiration precaution training; Oropharyngeal exercises; Compensatory techniques; Patient/family education; Trials of upgraded texture/liquids Recommendations Recommendations for follow up therapy are one component of a multi-disciplinary discharge planning process, led by the attending physician.  Recommendations may be updated based on patient status, additional functional criteria and insurance authorization. Assessment: Orofacial Exam: Orofacial Exam Oral Cavity: Oral Hygiene: WFL Oral Cavity - Dentition: Dentures, not available; Poor condition; Missing dentition Orofacial Anatomy: WFL Oral Motor/Sensory Function: Suspected cranial nerve impairment CN V - Trigeminal: WFL CN VII - Facial: Left motor impairment CN IX - Glossopharyngeal, CN X - Vagus: Left motor impairment CN XII - Hypoglossal: Left motor impairment Anatomy: Anatomy: Suspected cervical osteophytes; Prominent cricopharyngeus Boluses Administered: Boluses Administered Boluses Administered: Thin liquids (Level 0); Mildly thick liquids (Level 2, nectar thick); Moderately thick liquids (Level 3,  honey thick); Puree  Oral Impairment Domain: Oral Impairment  Domain Lip Closure: Escape progressing to mid-chin Tongue control during bolus hold: Not tested Bolus transport/lingual motion: Slow tongue motion Oral residue: Residue collection on oral structures Location of oral residue : Tongue; Palate Initiation of pharyngeal swallow : Pyriform sinuses  Pharyngeal Impairment Domain: Pharyngeal Impairment Domain Soft palate elevation: No bolus between soft palate (SP)/pharyngeal wall (PW) Laryngeal elevation: Complete superior movement of thyroid  cartilage with complete approximation of arytenoids to epiglottic petiole Anterior hyoid excursion: Complete anterior movement Epiglottic movement: Partial inversion Laryngeal vestibule closure: Complete, no air/contrast in laryngeal vestibule Pharyngeal stripping wave : Present - complete Pharyngeal contraction (A/P view only): N/A Pharyngoesophageal segment opening: Partial distention/partial duration, partial obstruction of flow Tongue base retraction: Narrow column of contrast or air between tongue base and PPW Pharyngeal residue: Collection of residue within or on pharyngeal structures Location of pharyngeal residue: Tongue base; Valleculae; Pyriform sinuses  Esophageal Impairment Domain: No data recorded Pill: No data recorded Penetration/Aspiration Scale Score: Penetration/Aspiration Scale Score 1.  Material does not enter airway: Puree 5.  Material enters airway, CONTACTS cords and not ejected out: Moderately thick liquids (Level 3, honey thick) 7.  Material enters airway, passes BELOW cords and not ejected out despite cough attempt by patient: Thin liquids (Level 0); Mildly thick liquids (Level 2, nectar thick) Compensatory Strategies: Compensatory Strategies Compensatory strategies: Yes Chin tuck: Ineffective Ineffective Chin Tuck: Thin liquid (Level 0)   General Information: Caregiver present: No  Diet Prior to this Study: NPO; Cortrak/Small bore NG tube   Temperature : Normal   Respiratory Status: WFL   Supplemental O2:  None (Room air)   History of Recent Intubation: Yes  Behavior/Cognition: Alert; Cooperative; Requires cueing Self-Feeding Abilities: Needs assist with self-feeding Baseline vocal quality/speech: Dysphonic Volitional Cough: Able to elicit Volitional Swallow: Able to elicit Exam Limitations: No limitations Goal Planning: Prognosis for improved oropharyngeal function: Good Barriers to Reach Goals: Cognitive deficits; Severity of deficits No data recorded Patient/Family Stated Goal: wants to sleep Consulted and agree with results and recommendations: Patient; Nurse Pain: Pain Assessment Pain Assessment: Faces Faces Pain Scale: 2 Facial Expression: 0 Body Movements: 0 Muscle Tension: 0 Compliance with ventilator (intubated pts.): 0 Vocalization (extubated pts.): N/A CPOT Total: 0 Pain Location: legs Pain Descriptors / Indicators: Grimacing; Discomfort Pain Intervention(s): Monitored during session End of Session: Start Time:SLP Start Time (ACUTE ONLY): 1318 Stop Time: SLP Stop Time (ACUTE ONLY): 1340 Time Calculation:SLP Time Calculation (min) (ACUTE ONLY): 22 min Charges: SLP Evaluations $ SLP Speech Visit: 1 Visit SLP Evaluations $BSS Swallow: 1 Procedure $MBS Swallow: 1 Procedure $ SLP EVAL LANGUAGE/SOUND PRODUCTION: 1 Procedure SLP visit diagnosis: SLP Visit Diagnosis: Dysphagia, oropharyngeal phase (R13.12) Past Medical History: Past Medical History: Diagnosis Date  Anxiety   Back pain   Cancer (HCC)   Depression   Emphysema   Esophageal spasm   Lung nodule   OSA (obstructive sleep apnea)   Osteomalacia   Personal history of radiation therapy   Vertigo   Vitamin D deficiency  Past Surgical History: Past Surgical History: Procedure Laterality Date  ABDOMINAL HYSTERECTOMY    APPENDECTOMY    BREAST BIOPSY Right 08/31/2020  BREAST EXCISIONAL BIOPSY Left 1978  BREAST LUMPECTOMY Right 09/30/2020  BREAST LUMPECTOMY WITH RADIOACTIVE SEED LOCALIZATION Right 09/30/2020  Procedure: RIGHT BREAST LUMPECTOMY WITH RADIOACTIVE SEED  LOCALIZATION;  Surgeon: Curvin Deward MOULD, MD;  Location: [facility];  Service: General;  Laterality: Right;  CARPAL TUNNEL RELEASE  IR CT HEAD LTD  06/25/2024  IR PERCUTANEOUS ART THROMBECTOMY/INFUSION INTRACRANIAL INC DIAG ANGIO  06/25/2024  RADIOLOGY WITH ANESTHESIA N/A 06/25/2024  Procedure: RADIOLOGY WITH ANESTHESIA;  Surgeon: Dolphus Carrion, MD;  Location: [facility] OR;  Service: Radiology;  Laterality: N/A;  ROTATOR CUFF REPAIR   Damien Blumenthal, M.A., CCC-SLP Speech Language Pathology, Acute Rehabilitation Services Secure Chat preferred [phone] 06/27/2024, 2:32 PM  Vitals:   06/28/24 0800 06/28/24 0804 06/28/24 0926 06/28/24 1045  BP: (!) 143/48  (!) 171/63 (!) 154/56  Pulse: 62  67   Resp: 16  14 16   Temp:  97.6 F (36.4 C) 99.5 F (37.5 C)   TempSrc:  Oral Axillary   SpO2: 97%  97%   Weight:      Height:       PHYSICAL EXAM General:  Critically ill appearing, elderly, Caucasian female laying in bed CV: Sinus bradycardia with rate in the 50's Respiratory: on Lambert in NAD  GI: Abdomen soft and nontender  NEURO:  Mental Status: awake and alert.  She is oriented to self, age, month situation and place. She can follow commands.   Cranial Nerves:  II: PERRL.  left hemianopsia, no blink to threat  III, IV, VI: Right gaze preference, will track towards midline but will not cross midline towards the left V: Does not respond to light touch on either side of the face VII: left facial droop  VIII: Hearing is intact to voice. IX, X: cough present  XI: Head is grossly midline XII: midline Motor: Left upper extremity is flaccid with withdrawal of the left lower extremity. Right arm and right leg spontaneous movement Sensation: decreased on left  Coordination: unable to assess Gait: Deferred  ASSESSMENT/PLAN Stroke:  right MCA territory infarct with R M1 occlusion s/p TNK and IR with TICI 2B initially but persistent occlusion eventually, etiology:  most likely large vessel disease     Code Stroke CT head No CT evidence of acute intracranial abnormality. ASPECTS is 10. CTA head & neck Abrupt occlusion of the distal M1 segment of the right middle cerebral artery S/p IR with repeated  reocclusion secondary to underlying long segment atherosclerotic plaque and final score [id number] Post IR CT small focal right perisylvian subarachnoid hemorrhage. MRI brain  Evolving right MCA territory infarct. Mild associated petechial blood products at the right frontoparietal region. No significant regional mass effect. 2. Small volume acute subarachnoid hemorrhage at the base of the right Sylvian fissure.   MRA Persistent and/or recurrent proximal right M2 occlusion, superior division.  2D Echo LVEF 60-65%, mild atrial dilation LDL 46 HgbA1c 5.1 VTE prophylaxis - heparin  subq aspirin  81 mg daily prior to admission, now on aspirin  81 mg daily given SAH Therapy recommendations:  SNF Disposition:  Pending  ? Hx of Stroke/TIA Acute onset of vertigo and gait disturbance in June 2018 with residual truncal ataxia requiring use of walker for ambulation  Concern for possible brainstem/cerebellar stroke at that time  Recommended for MRI evaluation at that time but this was lost to follow up   Acute Acalculous Cholecystitis E. Coli bacteremia Elevated LFTs, resolving Admitted for right upper quadrant pain and nausea vomiting. 1/4 blood cultures + for E. Coli S/p Flagyl  Continue Ceftriaxone  for 10 days per CCM  AST/ALT admission 1658/958 -> ...-> 25/57->37/60 Less likely endocarditis given the bacteria species  HTN Home meds:  None Stable Low dose cleviprex  started this morning for blood pressure control Add PRN labetalol , hydralazine  for BP control and titrate off cleviprex  gtt  Blood pressure goal SBP <160 given SAH Long term BP goal: Normotension  Hyperlipidemia Home meds:  atorvastatin  10 mg LDL 46, goal < 70 Held inpatient prior to transfer to skilled nursing facility if family  desires.  Greater than 50% time during this 35-minute visit was spent in counseling and coordination of care about a stroke and dysphagia discussion with patient and family and answering questions.  Elevated LFTs High intensity statin not indicated as patient is at goal at lower dose Consider resume statin once po access and LFT normalizes  Dysphagia Patient has post-stroke dysphagia SLP consult Did not pass swallow, Currently n.p.o. S/p cortrak  Now on TF @ 45  Post-op Respiratory Insufficiency COPD  Was concerning for fluid overload status post diuretics CCM management appreciated Was on peripheral and fentanyl , now off Wean sedation  SBT per CCM Extubated 7/10, tolerating well  Other Stroke Risk Factors Advanced age Family hx stroke (possible stroke versus MI in patient's father) Remote tobacco use history 56-pack-year history, stopped smoking 8 years ago CAD/PAD Obstructive sleep apnea, on CPAP at home though reported not using effectively PTA  Other Active Problems Hypomagnesemia, Will replace and recheck in AM Anemia, Hbg 13.8 on admission > 10.6 Depression on Lexapro   Hospital day # 7   Karna Geralds DNP, ACNPC-AG  [facility]  I have personally obtained history,examined this patient, reviewed notes, independently viewed imaging studies, participated in medical decision making and plan of care.ROS completed by me personally and pertinent positives fully documented  I have made any additions or clarifications directly to the above note. Agree with note above.  Patient with right MCA infarct treated with TNK but with significant persistent deficits.  Continue aspirin  alone for stroke prevention given subarachnoid hemorrhage.  Continue ongoing therapies.  Mobilize out of bed.  Speech therapy for swallow eval.  Will need likely PEG tube prior to transfer to rehab to skilled nursing facility if family is agreeable and desires.  Greater than 50% time during this  35-minute visit for spent in counseling and coordination of care and discussion with patient and family and care team answering questions  Eather Popp, MD Medical Director [facility] Pager: [phone] 06/28/2024 3:20 PM  To contact Stroke Continuity provider, please refer to [email or web address]. After hours, contact General Neurology

## 2024-06-29 DIAGNOSIS — I69391 Dysphagia following cerebral infarction: Secondary | ICD-10-CM | POA: Diagnosis not present

## 2024-06-29 DIAGNOSIS — I779 Disorder of arteries and arterioles, unspecified: Secondary | ICD-10-CM | POA: Diagnosis not present

## 2024-06-29 DIAGNOSIS — I63511 Cerebral infarction due to unspecified occlusion or stenosis of right middle cerebral artery: Secondary | ICD-10-CM | POA: Diagnosis not present

## 2024-06-29 DIAGNOSIS — I609 Nontraumatic subarachnoid hemorrhage, unspecified: Secondary | ICD-10-CM | POA: Diagnosis not present

## 2024-06-29 LAB — GLUCOSE, CAPILLARY
Glucose-Capillary: 132 mg/dL — ABNORMAL HIGH (ref 70–99)
Glucose-Capillary: 116 mg/dL — ABNORMAL HIGH (ref 70–99)
Glucose-Capillary: 133 mg/dL — ABNORMAL HIGH (ref 70–99)
Glucose-Capillary: 129 mg/dL — ABNORMAL HIGH (ref 70–99)
Glucose-Capillary: 133 mg/dL — ABNORMAL HIGH (ref 70–99)

## 2024-06-29 LAB — MAGNESIUM: Magnesium: 1.9 mg/dL (ref 1.7–2.4)

## 2024-06-29 LAB — PHOSPHORUS: Phosphorus: 3.3 mg/dL (ref 2.5–4.6)

## 2024-06-29 NOTE — Plan of Care (Signed)
 Problem: Education: Goal: Knowledge of General Education information will improve Description: Including pain rating scale, medication(s)/side effects and non-pharmacologic comfort measures 06/29/2024 0511 by Vicci Delon PARAS, RN Outcome: Progressing 06/29/2024 0442 by Vicci Delon PARAS, RN Outcome: Progressing   Problem: Health Behavior/Discharge Planning: Goal: Ability to manage health-related needs will improve 06/29/2024 0511 by Vicci Delon PARAS, RN Outcome: Progressing 06/29/2024 0442 by Vicci Delon PARAS, RN Outcome: Progressing   Problem: Clinical Measurements: Goal: Ability to maintain clinical measurements within normal limits will improve 06/29/2024 0511 by Vicci Delon PARAS, RN Outcome: Progressing 06/29/2024 0442 by Vicci Delon PARAS, RN Outcome: Progressing Goal: Will remain free from infection 06/29/2024 0511 by Vicci Delon PARAS, RN Outcome: Progressing 06/29/2024 0442 by Vicci Delon PARAS, RN Outcome: Progressing Goal: Diagnostic test results will improve 06/29/2024 0511 by Vicci Delon PARAS, RN Outcome: Progressing 06/29/2024 0442 by Vicci Delon PARAS, RN Outcome: Progressing Goal: Respiratory complications will improve 06/29/2024 0511 by Vicci Delon PARAS, RN Outcome: Progressing 06/29/2024 0442 by Vicci Delon PARAS, RN Outcome: Progressing Goal: Cardiovascular complication will be avoided 06/29/2024 0511 by Vicci Delon PARAS, RN Outcome: Progressing 06/29/2024 0442 by Vicci Delon PARAS, RN Outcome: Progressing   Problem: Activity: Goal: Risk for activity intolerance will decrease 06/29/2024 0511 by Vicci Delon PARAS, RN Outcome: Progressing 06/29/2024 0442 by Vicci Delon PARAS, RN Outcome: Progressing   Problem: Nutrition: Goal: Adequate nutrition will be maintained 06/29/2024 0511 by Vicci Delon PARAS, RN Outcome: Progressing 06/29/2024 0442 by Vicci Delon PARAS, RN Outcome: Progressing   Problem: Coping: Goal: Level  of anxiety will decrease 06/29/2024 0511 by Vicci Delon PARAS, RN Outcome: Progressing 06/29/2024 0442 by Vicci Delon PARAS, RN Outcome: Progressing   Problem: Elimination: Goal: Will not experience complications related to bowel motility 06/29/2024 0511 by Vicci Delon PARAS, RN Outcome: Progressing 06/29/2024 0442 by Vicci Delon PARAS, RN Outcome: Progressing Goal: Will not experience complications related to urinary retention 06/29/2024 0511 by Vicci Delon PARAS, RN Outcome: Progressing 06/29/2024 0442 by Vicci Delon PARAS, RN Outcome: Progressing   Problem: Pain Managment: Goal: General experience of comfort will improve and/or be controlled 06/29/2024 0511 by Vicci Delon PARAS, RN Outcome: Progressing 06/29/2024 0442 by Vicci Delon PARAS, RN Outcome: Progressing   Problem: Safety: Goal: Ability to remain free from injury will improve 06/29/2024 0511 by Vicci Delon PARAS, RN Outcome: Progressing 06/29/2024 0442 by Vicci Delon PARAS, RN Outcome: Progressing   Problem: Skin Integrity: Goal: Risk for impaired skin integrity will decrease 06/29/2024 0511 by Vicci Delon PARAS, RN Outcome: Progressing 06/29/2024 0442 by Vicci Delon PARAS, RN Outcome: Progressing   Problem: Education: Goal: Knowledge of disease or condition will improve 06/29/2024 0511 by Vicci Delon PARAS, RN Outcome: Progressing 06/29/2024 0442 by Vicci Delon PARAS, RN Outcome: Progressing Goal: Knowledge of secondary prevention will improve (MUST DOCUMENT ALL) 06/29/2024 0511 by Vicci Delon PARAS, RN Outcome: Progressing 06/29/2024 0442 by Vicci Delon PARAS, RN Outcome: Progressing Goal: Knowledge of patient specific risk factors will improve (DELETE if not current risk factor) 06/29/2024 0511 by Vicci Delon PARAS, RN Outcome: Progressing 06/29/2024 0442 by Vicci Delon PARAS, RN Outcome: Progressing   Problem: Ischemic Stroke/TIA Tissue Perfusion: Goal: Complications of  ischemic stroke/TIA will be minimized 06/29/2024 0511 by Vicci Delon PARAS, RN Outcome: Progressing 06/29/2024 0442 by Vicci Delon PARAS, RN Outcome: Progressing   Problem: Coping: Goal: Will verbalize positive feelings about self 06/29/2024 0511 by Vicci Delon PARAS, RN Outcome: Progressing 06/29/2024 0442 by Vicci Delon PARAS, RN Outcome: Progressing Goal: Will identify appropriate support needs 06/29/2024  [id number] by Vicci Delon PARAS, RN Outcome: Progressing 06/29/2024 0442 by Vicci Delon PARAS, RN Outcome: Progressing   Problem: Health Behavior/Discharge Planning: Goal: Ability to manage health-related needs will improve 06/29/2024 0511 by Vicci Delon PARAS, RN Outcome: Progressing 06/29/2024 0442 by Vicci Delon PARAS, RN Outcome: Progressing Goal: Goals will be collaboratively established with patient/family 06/29/2024 0511 by Vicci Delon PARAS, RN Outcome: Progressing 06/29/2024 0442 by Vicci Delon PARAS, RN Outcome: Progressing   Problem: Self-Care: Goal: Ability to participate in self-care as condition permits will improve 06/29/2024 0511 by Vicci Delon PARAS, RN Outcome: Progressing 06/29/2024 0442 by Vicci Delon PARAS, RN Outcome: Progressing Goal: Verbalization of feelings and concerns over difficulty with self-care will improve 06/29/2024 0511 by Vicci Delon PARAS, RN Outcome: Progressing 06/29/2024 0442 by Vicci Delon PARAS, RN Outcome: Progressing Goal: Ability to communicate needs accurately will improve 06/29/2024 0511 by Vicci Delon PARAS, RN Outcome: Progressing 06/29/2024 0442 by Vicci Delon PARAS, RN Outcome: Progressing   Problem: Nutrition: Goal: Risk of aspiration will decrease 06/29/2024 0511 by Vicci Delon PARAS, RN Outcome: Progressing 06/29/2024 0442 by Vicci Delon PARAS, RN Outcome: Progressing Goal: Dietary intake will improve 06/29/2024 0511 by Vicci Delon PARAS, RN Outcome: Progressing 06/29/2024 0442 by Vicci Delon PARAS, RN Outcome: Progressing   Problem: Activity: Goal: Ability to tolerate increased activity will improve 06/29/2024 0511 by Vicci Delon PARAS, RN Outcome: Progressing 06/29/2024 0442 by Vicci Delon PARAS, RN Outcome: Progressing   Problem: Respiratory: Goal: Ability to maintain a clear airway and adequate ventilation will improve 06/29/2024 0511 by Vicci Delon PARAS, RN Outcome: Progressing 06/29/2024 0442 by Vicci Delon PARAS, RN Outcome: Progressing   Problem: Role Relationship: Goal: Method of communication will improve 06/29/2024 0511 by Vicci Delon PARAS, RN Outcome: Progressing 06/29/2024 0442 by Vicci Delon PARAS, RN Outcome: Progressing   Problem: Education: Goal: Understanding of CV disease, CV risk reduction, and recovery process will improve 06/29/2024 0511 by Vicci Delon PARAS, RN Outcome: Progressing 06/29/2024 0442 by Vicci Delon PARAS, RN Outcome: Progressing Goal: Individualized Educational Video(s) 06/29/2024 0511 by Vicci Delon PARAS, RN Outcome: Progressing 06/29/2024 0442 by Vicci Delon PARAS, RN Outcome: Progressing   Problem: Activity: Goal: Ability to return to baseline activity level will improve 06/29/2024 0511 by Vicci Delon PARAS, RN Outcome: Progressing 06/29/2024 0442 by Vicci Delon PARAS, RN Outcome: Progressing   Problem: Cardiovascular: Goal: Ability to achieve and maintain adequate cardiovascular perfusion will improve 06/29/2024 0511 by Vicci Delon PARAS, RN Outcome: Progressing 06/29/2024 0442 by Vicci Delon PARAS, RN Outcome: Progressing Goal: Vascular access site(s) Level 0-1 will be maintained 06/29/2024 0511 by Vicci Delon PARAS, RN Outcome: Progressing 06/29/2024 0442 by Vicci Delon PARAS, RN Outcome: Progressing   Problem: Health Behavior/Discharge Planning: Goal: Ability to safely manage health-related needs after discharge will improve 06/29/2024 0511 by Vicci Delon PARAS, RN Outcome:  Progressing 06/29/2024 0442 by Vicci Delon PARAS, RN Outcome: Progressing

## 2024-06-29 NOTE — Plan of Care (Signed)
  Problem: Education: Goal: Knowledge of General Education information will improve Description: Including pain rating scale, medication(s)/side effects and non-pharmacologic comfort measures Outcome: Progressing   Problem: Health Behavior/Discharge Planning: Goal: Ability to manage health-related needs will improve Outcome: Progressing   Problem: Clinical Measurements: Goal: Ability to maintain clinical measurements within normal limits will improve Outcome: Progressing Goal: Will remain free from infection Outcome: Progressing Goal: Diagnostic test results will improve Outcome: Progressing Goal: Respiratory complications will improve Outcome: Progressing Goal: Cardiovascular complication will be avoided Outcome: Progressing   Problem: Activity: Goal: Risk for activity intolerance will decrease Outcome: Progressing   Problem: Nutrition: Goal: Adequate nutrition will be maintained Outcome: Progressing   Problem: Coping: Goal: Level of anxiety will decrease Outcome: Progressing   Problem: Elimination: Goal: Will not experience complications related to bowel motility Outcome: Progressing Goal: Will not experience complications related to urinary retention Outcome: Progressing   Problem: Pain Managment: Goal: General experience of comfort will improve and/or be controlled Outcome: Progressing   Problem: Safety: Goal: Ability to remain free from injury will improve Outcome: Progressing   Problem: Skin Integrity: Goal: Risk for impaired skin integrity will decrease Outcome: Progressing   Problem: Education: Goal: Knowledge of disease or condition will improve Outcome: Progressing Goal: Knowledge of secondary prevention will improve (MUST DOCUMENT ALL) Outcome: Progressing Goal: Knowledge of patient specific risk factors will improve (DELETE if not current risk factor) Outcome: Progressing   Problem: Ischemic Stroke/TIA Tissue Perfusion: Goal: Complications of  ischemic stroke/TIA will be minimized Outcome: Progressing   Problem: Coping: Goal: Will verbalize positive feelings about self Outcome: Progressing Goal: Will identify appropriate support needs Outcome: Progressing   Problem: Health Behavior/Discharge Planning: Goal: Ability to manage health-related needs will improve Outcome: Progressing Goal: Goals will be collaboratively established with patient/family Outcome: Progressing   Problem: Self-Care: Goal: Ability to participate in self-care as condition permits will improve Outcome: Progressing Goal: Verbalization of feelings and concerns over difficulty with self-care will improve Outcome: Progressing Goal: Ability to communicate needs accurately will improve Outcome: Progressing   Problem: Nutrition: Goal: Risk of aspiration will decrease Outcome: Progressing Goal: Dietary intake will improve Outcome: Progressing   Problem: Activity: Goal: Ability to tolerate increased activity will improve Outcome: Progressing   Problem: Respiratory: Goal: Ability to maintain a clear airway and adequate ventilation will improve Outcome: Progressing   Problem: Role Relationship: Goal: Method of communication will improve Outcome: Progressing   Problem: Education: Goal: Understanding of CV disease, CV risk reduction, and recovery process will improve Outcome: Progressing Goal: Individualized Educational Video(s) Outcome: Progressing   Problem: Activity: Goal: Ability to return to baseline activity level will improve Outcome: Progressing   Problem: Cardiovascular: Goal: Ability to achieve and maintain adequate cardiovascular perfusion will improve Outcome: Progressing Goal: Vascular access site(s) Level 0-1 will be maintained Outcome: Progressing   Problem: Health Behavior/Discharge Planning: Goal: Ability to safely manage health-related needs after discharge will improve Outcome: Progressing

## 2024-06-29 NOTE — Progress Notes (Signed)
 37 STROKE TEAM PROGRESS NOTE   BRIEF HPI [name] is a 89 y.o. female with history of COPD, PAD, HLD, OSA, right breast carcinoma stage Ia s/p lumpectomy, radiation, and hormone therapy in remission admitted initially on 7/5 with RUQ abdominal pain, nausea, vomiting with initial workup revealing elevated LFTs and possible cholecystitis/E. coli bacteremia s/p 5 days of ceftriaxone .  Hospital course complicated by hallucinations, encephalopathy, some difficulty breathing treated with diuretics.  While patient's daughter was visiting her in the hospital yesterday, the patient developed a sudden onset of left-sided weakness with a right gaze deviation and slurred speech.  SIGNIFICANT HOSPITAL EVENTS 7/5: Admitted for RUQ pain, nausea, vomiting - Hospitalization complicated by encephalopathy, hallucinations - E. Coli bacteremia treated with 5 days of ceftriaxone  7/9:  - 18:15 daughter witnessed patient with sudden onset slurred speech, left-sided weakness, and right gaze deviation for which a Code Stroke was activated - TNKase  administered (no contraindication per primary team and surgery) - CTH with right MCA M1 occlusion, taken to IR for thrombectomy with TICI 2B revacaularization - Remained intubated for airway protection and admitted to the ICU overnight   - Sedation paused this morning with some prolonged sedating effects   INTERIM HISTORY/SUBJECTIVE 2 daughters are at the bedside.  They feel they will be able to take care of the patient but are wondering if she will qualify for inpatient rehab.  Therapy is recommending skilled nursing facility however.  Patient is laying in bed in no apparent distress.  No new neurological events overnight Vitals remain stable.  Neurological exam unchanged  CBC    Component Value Date/Time   WBC 13.5 (H) 06/27/2024 0530   RBC 3.20 (L) 06/27/2024 0530   HGB 10.0 (L) 06/27/2024 0530   HGB 13.2 05/05/2024 1343   HCT 29.9 (L) 06/27/2024 0530    PLT 216 06/27/2024 0530   PLT 150 05/05/2024 1343   MCV 93.4 06/27/2024 0530   MCH 31.3 06/27/2024 0530   MCHC 33.4 06/27/2024 0530   RDW 13.2 06/27/2024 0530   LYMPHSABS 0.8 06/26/2024 0530   MONOABS 0.9 06/26/2024 0530   EOSABS 0.2 06/26/2024 0530   BASOSABS 0.1 06/26/2024 0530   BMET    Component Value Date/Time   NA 136 06/27/2024 0530   K 3.3 (L) 06/27/2024 0530   CL 103 06/27/2024 0530   CO2 23 06/27/2024 0530   GLUCOSE 106 (H) 06/27/2024 0530   BUN 5 (L) 06/27/2024 0530   CREATININE 0.53 06/27/2024 0530   CREATININE 0.74 05/05/2024 1343   CALCIUM  7.8 (L) 06/27/2024 0530   GFRNONAA >60 06/27/2024 0530   GFRNONAA >60 05/05/2024 1343   Lab Results  Component Value Date   HGBA1C 5.1 06/26/2024    Lab Results  Component Value Date   CHOL 96 06/26/2024   HDL 35 (L) 06/26/2024   LDLCALC 46 06/26/2024   TRIG 74 06/26/2024   TRIG 74 06/26/2024   CHOLHDL 2.7 06/26/2024   Drugs of Abuse  No results found for: LABOPIA, COCAINSCRNUR, LABBENZ, AMPHETMU, THCU, LABBARB   Urinalysis    Component Value Date/Time   COLORURINE YELLOW 06/21/2024 1532   APPEARANCEUR CLEAR 06/21/2024 1532   LABSPEC 1.013 06/21/2024 1532   PHURINE 7.0 06/21/2024 1532   GLUCOSEU NEGATIVE 06/21/2024 1532   HGBUR MODERATE (A) 06/21/2024 1532   BILIRUBINUR NEGATIVE 06/21/2024 1532   KETONESUR NEGATIVE 06/21/2024 1532   PROTEINUR NEGATIVE 06/21/2024 1532   UROBILINOGEN 1.0 11/01/2007 1614   NITRITE POSITIVE (A) 06/21/2024 1532  LEUKOCYTESUR NEGATIVE 06/21/2024 1532   IMAGING past 24 hours No results found.  Vitals:   06/29/24 0412 06/29/24 0500 06/29/24 0743 06/29/24 0832  BP: (!) 157/55  (!) 136/50   Pulse: 66  63 65  Resp: 18  19 20   Temp: 98.5 F (36.9 C)  98.3 F (36.8 C)   TempSrc: Oral     SpO2: 97%  97% 95%  Weight:  71.7 kg    Height:       PHYSICAL EXAM General:  Critically ill appearing, elderly, Caucasian female laying in bed CV: Sinus bradycardia with  rate in the 50's Respiratory: on Mathews in NAD  GI: Abdomen soft and nontender  NEURO:  Mental Status: awake and alert.  She is oriented to self, age, month situation and place. She can follow commands.   Cranial Nerves:  II: PERRL.  left hemianopsia, no blink to threat  III, IV, VI: Right gaze preference, will track towards midline but will not cross midline towards the left V: Does not respond to light touch on either side of the face VII: left facial droop  VIII: Hearing is intact to voice. IX, X: cough present  XI: Head is grossly midline XII: midline Motor: Left upper extremity is flaccid with withdrawal of the left lower extremity. Right arm and right leg spontaneous movement Sensation: decreased on left  Coordination: unable to assess Gait: Deferred  ASSESSMENT/PLAN Stroke:  right MCA territory infarct with R M1 occlusion s/p TNK and IR with TICI 2B initially but persistent occlusion eventually, etiology:  most likely large vessel disease    Code Stroke CT head No CT evidence of acute intracranial abnormality. ASPECTS is 10. CTA head & neck Abrupt occlusion of the distal M1 segment of the right middle cerebral artery S/p IR with repeated  reocclusion secondary to underlying long segment atherosclerotic plaque and final score TICI2b Post IR CT small focal right perisylvian subarachnoid hemorrhage. MRI brain  Evolving right MCA territory infarct. Mild associated petechial blood products at the right frontoparietal region. No significant regional mass effect. 2. Small volume acute subarachnoid hemorrhage at the base of the right Sylvian fissure.   MRA Persistent and/or recurrent proximal right M2 occlusion, superior division.  2D Echo LVEF 60-65%, mild atrial dilation LDL 46 HgbA1c 5.1 VTE prophylaxis - heparin  subq aspirin  81 mg daily prior to admission, now on aspirin  81 mg daily given [facility] Therapy recommendations:  SNF Disposition:  Pending  ? Hx of Stroke/TIA Acute onset of  vertigo and gait disturbance in June 2018 with residual truncal ataxia requiring use of walker for ambulation  Concern for possible brainstem/cerebellar stroke at that time  Recommended for MRI evaluation at that time but this was lost to follow up   Acute Acalculous Cholecystitis E. Coli bacteremia Elevated LFTs, resolving Admitted for right upper quadrant pain and nausea vomiting. 1/4 blood cultures + for E. Coli S/p Flagyl  Continue Ceftriaxone  for 10 days per CCM  AST/ALT admission 1658/958 -> ...-> 25/57->37/60 Less likely endocarditis given the bacteria species  HTN Home meds:  None Stable Low dose cleviprex  started this morning for blood pressure control Add PRN labetalol , hydralazine  for BP control and titrate off cleviprex  gtt Blood pressure goal SBP <160 given SAH Long term BP goal: Normotension  Hyperlipidemia Home meds:  atorvastatin  10 mg LDL 46, goal < 70 Held inpatient prior to transfer to skilled nursing facility if family desires.  Greater than 50% time during this 35-minute visit was spent in counseling and coordination  of care about a stroke and dysphagia discussion with patient and family and answering questions.  Elevated LFTs High intensity statin not indicated as patient is at goal at lower dose Consider resume statin once po access and LFT normalizes  Dysphagia Patient has post-stroke dysphagia SLP consult Did not pass swallow, Currently n.p.o. S/p cortrak  Now on TF @ 45  Post-op Respiratory Insufficiency COPD  Was concerning for fluid overload status post diuretics CCM management appreciated Was on peripheral and fentanyl , now off Wean sedation  SBT per CCM Extubated 7/10, tolerating well  Other Stroke Risk Factors Advanced age Family hx stroke (possible stroke versus MI in patient's father) Remote tobacco use history 56-pack-year history, stopped smoking 8 years ago CAD/PAD Obstructive sleep apnea, on CPAP at home though reported not using  effectively PTA  Other Active Problems Hypomagnesemia, Will replace and recheck in AM Anemia, Hbg 13.8 on admission > 10.6 Depression on Lexapro   Hospital day # 8   Patient with right MCA infarct treated with TNK but with significant persistent deficits.  Continue aspirin  alone for stroke prevention given subarachnoid hemorrhage.  Continue ongoing therapies.  Mobilize out of bed.  Speech therapy for swallow eval.  Will need likely PEG tube prior to transfer to rehab to skilled nursing facility if family is agreeable and desires.  Long discussion with patient and her 2 daughters and answered questions.  Greater than 50% time during this 25-minute visit for spent in counseling and coordination of care and discussion with patient and family and care team answering questions  Eather Popp, MD Medical Director [facility] Pager: [phone] 06/29/2024 11:21 AM  To contact Stroke Continuity provider, please refer to [email or web address]. After hours, contact General Neurology

## 2024-06-30 DIAGNOSIS — I779 Disorder of arteries and arterioles, unspecified: Secondary | ICD-10-CM | POA: Diagnosis not present

## 2024-06-30 DIAGNOSIS — I69391 Dysphagia following cerebral infarction: Secondary | ICD-10-CM | POA: Diagnosis not present

## 2024-06-30 DIAGNOSIS — I63511 Cerebral infarction due to unspecified occlusion or stenosis of right middle cerebral artery: Secondary | ICD-10-CM | POA: Diagnosis not present

## 2024-06-30 DIAGNOSIS — I609 Nontraumatic subarachnoid hemorrhage, unspecified: Secondary | ICD-10-CM | POA: Diagnosis not present

## 2024-06-30 LAB — GLUCOSE, CAPILLARY
Glucose-Capillary: 118 mg/dL — ABNORMAL HIGH (ref 70–99)
Glucose-Capillary: 123 mg/dL — ABNORMAL HIGH (ref 70–99)
Glucose-Capillary: 126 mg/dL — ABNORMAL HIGH (ref 70–99)
Glucose-Capillary: 129 mg/dL — ABNORMAL HIGH (ref 70–99)
Glucose-Capillary: 135 mg/dL — ABNORMAL HIGH (ref 70–99)
Glucose-Capillary: 137 mg/dL — ABNORMAL HIGH (ref 70–99)
Glucose-Capillary: 152 mg/dL — ABNORMAL HIGH (ref 70–99)

## 2024-06-30 LAB — MAGNESIUM: Magnesium: 1.9 mg/dL (ref 1.7–2.4)

## 2024-06-30 LAB — PHOSPHORUS: Phosphorus: 4.6 mg/dL (ref 2.5–4.6)

## 2024-06-30 MED ORDER — GERHARDT'S BUTT CREAM
TOPICAL_CREAM | Freq: Three times a day (TID) | CUTANEOUS | Status: DC
  Filled 2024-06-30: qty 60

## 2024-06-30 MED ORDER — DICLOFENAC SODIUM 1 % EX GEL
2.0000 g | Freq: Four times a day (QID) | CUTANEOUS | Status: DC
  Administered 2024-06-30 – 2024-07-08 (×31): 2 g via TOPICAL
  Filled 2024-06-30: qty 100

## 2024-06-30 NOTE — Progress Notes (Addendum)
 Speech Language Pathology Treatment: Dysphagia;Cognitive-Linguistic  Patient Details Name: MIYEKO MAHLUM MRN: [id number] DOB: [birth year] Today's Date: 06/30/2024 Time: 8759-8691 SLP Time Calculation (min) (ACUTE ONLY): 28 min  Assessment / Plan / Recommendation Clinical Impression  Pt more alert today but session overall limited by what pt describes as arthritic knee pain, for which family is requesting pain meds (RN notified). She is unable to track objects to midline or to the L of midline given Max cueing. She remains inattentive to functional tasks, which may be further impacted by her pain today. Her dysarthria has improved but she remains unintelligible at times during conversation, requiring cueing to repeat herself. Given biofeedback, pt was able to seal her lips around a straw and exhale forcefully so may consider initiation of RMT in future sessions. Reviewed MBS with pt and her family. Provided oral care and offered ice chips, which pt required cueing to transit orally. She did not cough spontaneously and did not follow commands to do so after swallowing either. Recommend NPO except ice chips in moderation after oral care with nursing staff only. SLP will continue following.     HPI HPI: ESTER HILLEY is an 89 yo female presenting to ED 7/5 for RUQ pain, nausea/vomiting. Found to have suspected cholecystitis vs passed choledocholithiasis and acute liver injury, hyponatremia, bacteriuria, and hematuria. Initially seen at [facility] ED 7/3 with n/v and elevated LFTs but left without being seen. Daughter noted sudden onset AMS and L sided weakness 7/9 with CTA showing abrupt occlusion of distal R MCA M1 s/p TNK. Taken to IR with unsuccessful revascularization (repeated reocclusion), post-procedure CTH showed small SAH. Remained intubated after the procedure, 7/9-7/10. PMH includes HLD, PAD, COPD with emphysema, OSA, R breast cancer, back pain, esophageal spasm      SLP Plan  Continue with current  plan of care          Recommendations  Diet recommendations: NPO Medication Administration: Via alternative means                  Oral care QID;Oral care prior to ice chip/H20   Frequent or constant Supervision/Assistance Dysphagia, oropharyngeal phase (R13.12);Dysarthria and anarthria (R47.1);Cognitive communication deficit (M58.158)     Continue with current plan of care     Damien Blumenthal, M.A., CCC-SLP Speech Language Pathology, Acute Rehabilitation Services  Secure Chat preferred [phone]   06/30/2024, 1:38 PM

## 2024-06-30 NOTE — Progress Notes (Signed)
 PT Cancellation Note  Patient Details Name: [name] MRN: [id number] DOB: December 10, 1932   Cancelled Treatment:    Reason Eval/Treat Not Completed: (P) Patient declined, no reason specified (Pt sleeping upon entry and family requesting to let pt rest since she has not been sleeping well. Will continue to follow per PT POC.)   Darryle George 06/30/2024, 2:43 PM

## 2024-06-30 NOTE — Plan of Care (Signed)
  Problem: Education: Goal: Knowledge of General Education information will improve Description: Including pain rating scale, medication(s)/side effects and non-pharmacologic comfort measures Outcome: Progressing   Problem: Health Behavior/Discharge Planning: Goal: Ability to manage health-related needs will improve Outcome: Progressing   Problem: Clinical Measurements: Goal: Ability to maintain clinical measurements within normal limits will improve Outcome: Progressing Goal: Will remain free from infection Outcome: Progressing Goal: Diagnostic test results will improve Outcome: Progressing Goal: Respiratory complications will improve Outcome: Progressing Goal: Cardiovascular complication will be avoided Outcome: Progressing   Problem: Activity: Goal: Risk for activity intolerance will decrease Outcome: Progressing   Problem: Nutrition: Goal: Adequate nutrition will be maintained Outcome: Progressing   Problem: Coping: Goal: Level of anxiety will decrease Outcome: Progressing   Problem: Elimination: Goal: Will not experience complications related to bowel motility Outcome: Progressing Goal: Will not experience complications related to urinary retention Outcome: Progressing   Problem: Pain Managment: Goal: General experience of comfort will improve and/or be controlled Outcome: Progressing   Problem: Safety: Goal: Ability to remain free from injury will improve Outcome: Progressing   Problem: Skin Integrity: Goal: Risk for impaired skin integrity will decrease Outcome: Progressing   Problem: Education: Goal: Knowledge of disease or condition will improve Outcome: Progressing Goal: Knowledge of secondary prevention will improve (MUST DOCUMENT ALL) Outcome: Progressing Goal: Knowledge of patient specific risk factors will improve (DELETE if not current risk factor) Outcome: Progressing   Problem: Ischemic Stroke/TIA Tissue Perfusion: Goal: Complications of  ischemic stroke/TIA will be minimized Outcome: Progressing   Problem: Coping: Goal: Will verbalize positive feelings about self Outcome: Progressing Goal: Will identify appropriate support needs Outcome: Progressing   Problem: Health Behavior/Discharge Planning: Goal: Ability to manage health-related needs will improve Outcome: Progressing Goal: Goals will be collaboratively established with patient/family Outcome: Progressing   Problem: Self-Care: Goal: Ability to participate in self-care as condition permits will improve Outcome: Progressing Goal: Verbalization of feelings and concerns over difficulty with self-care will improve Outcome: Progressing Goal: Ability to communicate needs accurately will improve Outcome: Progressing   Problem: Nutrition: Goal: Risk of aspiration will decrease Outcome: Progressing Goal: Dietary intake will improve Outcome: Progressing   Problem: Activity: Goal: Ability to tolerate increased activity will improve Outcome: Progressing   Problem: Respiratory: Goal: Ability to maintain a clear airway and adequate ventilation will improve Outcome: Progressing   Problem: Role Relationship: Goal: Method of communication will improve Outcome: Progressing   Problem: Education: Goal: Understanding of CV disease, CV risk reduction, and recovery process will improve Outcome: Progressing Goal: Individualized Educational Video(s) Outcome: Progressing   Problem: Activity: Goal: Ability to return to baseline activity level will improve Outcome: Progressing   Problem: Cardiovascular: Goal: Ability to achieve and maintain adequate cardiovascular perfusion will improve Outcome: Progressing Goal: Vascular access site(s) Level 0-1 will be maintained Outcome: Progressing   Problem: Health Behavior/Discharge Planning: Goal: Ability to safely manage health-related needs after discharge will improve Outcome: Progressing

## 2024-06-30 NOTE — Progress Notes (Addendum)
 37 STROKE TEAM PROGRESS NOTE   BRIEF HPI [name] is a 89 y.o. female with history of COPD, PAD, HLD, OSA, right breast carcinoma stage Ia s/p lumpectomy, radiation, and hormone therapy in remission admitted initially on 7/5 with RUQ abdominal pain, nausea, vomiting with initial workup revealing elevated LFTs and possible cholecystitis/E. coli bacteremia s/p 5 days of ceftriaxone .  Hospital course complicated by hallucinations, encephalopathy, some difficulty breathing treated with diuretics. Patient developed a sudden onset of left-sided weakness with a right gaze deviation and slurred speech.  SIGNIFICANT HOSPITAL EVENTS 7/5: Admitted for RUQ pain, nausea, vomiting E. Coli bacteremia treated with 5 days of ceftriaxone  7/9: 18:15 daughter witnessed patient with sudden onset slurred speech, left-sided weakness, and right gaze deviation for which a Code Stroke was activated - TNKase  administered (no contraindication per primary team and surgery) - CTH with right MCA M1 occlusion, taken to IR for thrombectomy with TICI 2B revacaularization - Remained intubated for airway protection and admitted to the ICU overnight   - Sedation paused this morning with some prolonged sedating effects  7/10:Extubated 1230 7/11: corTrak placed, started on ASA  INTERIM HISTORY/SUBJECTIVE  Extensive discussion held with all 3 daughters regarding plan of care. All questions answered.  In agreement with continued PT evals and therapy and discharge to either CIR or SNF depending on her ability to do therapies, etc. Discussed need for PEG if she continues to have difficulties swallowing and will need to go to SNF, agreeable.  No new neurological events overnight Vitals remain stable.  Neurological exam unchanged. Labs ordered for tomorrow AM.  Vitals at night decreased to once per shift to allow patient to sleep, per family request.   CBC    Component Value Date/Time   WBC 13.5 (H) 06/27/2024 0530   RBC  3.20 (L) 06/27/2024 0530   HGB 10.0 (L) 06/27/2024 0530   HGB 13.2 05/05/2024 1343   HCT 29.9 (L) 06/27/2024 0530   PLT 216 06/27/2024 0530   PLT 150 05/05/2024 1343   MCV 93.4 06/27/2024 0530   MCH 31.3 06/27/2024 0530   MCHC 33.4 06/27/2024 0530   RDW 13.2 06/27/2024 0530   LYMPHSABS 0.8 06/26/2024 0530   MONOABS 0.9 06/26/2024 0530   EOSABS 0.2 06/26/2024 0530   BASOSABS 0.1 06/26/2024 0530   BMET    Component Value Date/Time   NA 136 06/27/2024 0530   K 3.3 (L) 06/27/2024 0530   CL 103 06/27/2024 0530   CO2 23 06/27/2024 0530   GLUCOSE 106 (H) 06/27/2024 0530   BUN 5 (L) 06/27/2024 0530   CREATININE 0.53 06/27/2024 0530   CREATININE 0.74 05/05/2024 1343   CALCIUM  7.8 (L) 06/27/2024 0530   GFRNONAA >60 06/27/2024 0530   GFRNONAA >60 05/05/2024 1343   Lab Results  Component Value Date   HGBA1C 5.1 06/26/2024    Lab Results  Component Value Date   CHOL 96 06/26/2024   HDL 35 (L) 06/26/2024   LDLCALC 46 06/26/2024   TRIG 74 06/26/2024   TRIG 74 06/26/2024   CHOLHDL 2.7 06/26/2024   Drugs of Abuse  No results found for: LABOPIA, COCAINSCRNUR, LABBENZ, AMPHETMU, THCU, LABBARB   Urinalysis    Component Value Date/Time   COLORURINE YELLOW 06/21/2024 1532   APPEARANCEUR CLEAR 06/21/2024 1532   LABSPEC 1.013 06/21/2024 1532   PHURINE 7.0 06/21/2024 1532   GLUCOSEU NEGATIVE 06/21/2024 1532   HGBUR MODERATE (A) 06/21/2024 1532   BILIRUBINUR NEGATIVE 06/21/2024 1532   KETONESUR NEGATIVE 06/21/2024  1532   PROTEINUR NEGATIVE 06/21/2024 1532   UROBILINOGEN 1.0 11/01/2007 1614   NITRITE POSITIVE (A) 06/21/2024 1532   LEUKOCYTESUR NEGATIVE 06/21/2024 1532   IMAGING past 24 hours No results found.  Vitals:   06/30/24 0742 06/30/24 0853 06/30/24 1051 06/30/24 1107  BP: (!) 137/53  (!) 117/43 (!) 117/43  Pulse: 61 (!) 58  66  Resp:  16    Temp: 99.2 F (37.3 C)   99.2 F (37.3 C)  TempSrc: Oral   Axillary  SpO2: 98% 95%  99%  Weight:       Height:       PHYSICAL EXAM General:  Critically ill appearing, elderly, Caucasian female laying in bed CV: Sinus bradycardia with rate in the 50's Respiratory: Room air GI: Abdomen soft and nontender  NEURO:  Mental Status: awake and alert.  She is oriented to self, age, month situation and place. She can follow commands.   Cranial Nerves:  II: PERRL.  left hemianopsia, no blink to threat  III, IV, VI: Right gaze preference, will track towards midline but will not cross midline towards the left V: Does not respond to light touch on either side of the face VII: left facial droop  VIII: Hearing is intact to voice. IX, X: cough present  XI: Head is grossly midline XII: midline Motor: Left upper extremity is flaccid with withdrawal of the left lower extremity. Right arm and right leg spontaneous movement Sensation: decreased on left  Coordination: unable to assess Gait: Deferred  ASSESSMENT/PLAN Stroke:  right MCA territory infarct with R M1 occlusion s/p TNK and IR with TICI 2B initially but persistent occlusion eventually, etiology:  most likely large vessel disease    Code Stroke CT head No CT evidence of acute intracranial abnormality. ASPECTS is 10. CTA head & neck Abrupt occlusion of the distal M1 segment of the right middle cerebral artery S/p IR with repeated  reocclusion secondary to underlying long segment atherosclerotic plaque and final score TICI2b Post IR CT small focal right perisylvian subarachnoid hemorrhage. MRI brain  Evolving right MCA territory infarct. Mild associated petechial blood products at the right frontoparietal region. No significant regional mass effect. 2. Small volume acute subarachnoid hemorrhage at the base of the right Sylvian fissure.   MRA Persistent and/or recurrent proximal right M2 occlusion, superior division.  2D Echo LVEF 60-65%, mild atrial dilation LDL 46 HgbA1c 5.1 VTE prophylaxis - heparin  subq aspirin  81 mg daily prior to  admission, now on aspirin  81 mg daily given [facility] Therapy recommendations:  SNF Disposition:  Pending  ? Hx of Stroke/TIA Acute onset of vertigo and gait disturbance in June 2018 with residual truncal ataxia requiring use of walker for ambulation  Concern for possible brainstem/cerebellar stroke at that time  Recommended for MRI evaluation at that time but this was lost to follow up   Acute Acalculous Cholecystitis E. Coli bacteremia Elevated LFTs, resolving Admitted for right upper quadrant pain and nausea vomiting. 7/5 blood cultures + for E. Coli S/p Flagyl  Continue Cefadoxil  AST/ALT admission 1658/958 -> ...-> 25/57->37/60 Less likely endocarditis given the bacteria species  HTN Home meds:  None Stable Low dose cleviprex  started this morning for blood pressure control Add PRN labetalol , hydralazine  for BP control and titrate off cleviprex  gtt Blood pressure goal SBP <160 given SAH Long term BP goal: Normotension  Hyperlipidemia Home meds:  atorvastatin  10 mg LDL 46, goal < 70 Held inpatient prior to transfer to skilled nursing facility if family desires.  Greater than 50% time during this 35-minute visit was spent in counseling and coordination of care about a stroke and dysphagia discussion with patient and family and answering questions.  Elevated LFTs High intensity statin not indicated as patient is at goal at lower dose Consider resume statin once po access and LFT normalizes  Dysphagia Patient has post-stroke dysphagia SLP consult Did not pass swallow, Currently n.p.o. S/p cortrak  Continue TF @ 45  Post-op Respiratory Insufficiency COPD  Was concerning for fluid overload status post diuretics CCM management appreciated Was on peripheral and fentanyl , now off Wean sedation  SBT per CCM Extubated 7/10, tolerating well  Other Stroke Risk Factors Advanced age Family hx stroke (possible stroke versus MI in patient's father) Remote tobacco use history  56-pack-year history, stopped smoking 8 years ago CAD/PAD Obstructive sleep apnea, on CPAP at home though reported not using effectively PTA  Other Active Problems Anemia, Hbg 13.8 on admission > 10.6 Depression on Lexapro   Hospital day # 9  Neurological exam is unchanged.  Patient still has significant hemiplegia and right gaze deviation and is unable to swallow Patient with right MCA infarct treated with TNK but with significant persistent deficits.  Continue aspirin  alone for stroke prevention given subarachnoid hemorrhage.  Continue ongoing therapies.  Mobilize out of bed.  Speech therapy for swallow eval.  Will need likely PEG tube prior to transfer to rehab to skilled nursing facility if family is agreeable and desires.  Long discussion with patient and her 3 daughters and answered questions.   I personally spent a total of 35 minutes in the care of the patient today including getting/reviewing separately obtained history, performing a medically appropriate exam/evaluation, counseling and educating, placing orders, referring and communicating with other health care professionals, documenting clinical information in the EHR, independently interpreting results, and coordinating care.         Eather Popp, MD Medical Director [name] Pager: [phone] 06/30/2024 3:35 PM  To contact Stroke Continuity provider, please refer to [email or web address]. After hours, contact General Neurology

## 2024-07-01 DIAGNOSIS — I779 Disorder of arteries and arterioles, unspecified: Secondary | ICD-10-CM | POA: Diagnosis not present

## 2024-07-01 DIAGNOSIS — K810 Acute cholecystitis: Secondary | ICD-10-CM | POA: Diagnosis not present

## 2024-07-01 DIAGNOSIS — I63511 Cerebral infarction due to unspecified occlusion or stenosis of right middle cerebral artery: Secondary | ICD-10-CM | POA: Diagnosis not present

## 2024-07-01 DIAGNOSIS — I69391 Dysphagia following cerebral infarction: Secondary | ICD-10-CM | POA: Diagnosis not present

## 2024-07-01 LAB — GLUCOSE, CAPILLARY
Glucose-Capillary: 145 mg/dL — ABNORMAL HIGH (ref 70–99)
Glucose-Capillary: 137 mg/dL — ABNORMAL HIGH (ref 70–99)
Glucose-Capillary: 132 mg/dL — ABNORMAL HIGH (ref 70–99)
Glucose-Capillary: 117 mg/dL — ABNORMAL HIGH (ref 70–99)

## 2024-07-01 LAB — CBC
HCT: 31.8 % — ABNORMAL LOW (ref 36.0–46.0)
Hemoglobin: 10.4 g/dL — ABNORMAL LOW (ref 12.0–15.0)
MCH: 30.8 pg (ref 26.0–34.0)
MCHC: 32.7 g/dL (ref 30.0–36.0)
MCV: 94.1 fL (ref 80.0–100.0)
Platelets: 290 K/uL (ref 150–400)
RBC: 3.38 MIL/uL — ABNORMAL LOW (ref 3.87–5.11)
RDW: 13.2 % (ref 11.5–15.5)
WBC: 10.2 K/uL (ref 4.0–10.5)
nRBC: 0 % (ref 0.0–0.2)

## 2024-07-01 LAB — BASIC METABOLIC PANEL WITH GFR
Anion gap: 10 (ref 5–15)
BUN: 19 mg/dL (ref 8–23)
CO2: 23 mmol/L (ref 22–32)
Calcium: 9.1 mg/dL (ref 8.9–10.3)
Chloride: 100 mmol/L (ref 98–111)
Creatinine, Ser: 0.55 mg/dL (ref 0.44–1.00)
GFR, Estimated: >60 mL/min (ref >60)
Glucose, Bld: 117 mg/dL — ABNORMAL HIGH (ref 70–99)
Potassium: 4.3 mmol/L (ref 3.5–5.1)
Sodium: 133 mmol/L — ABNORMAL LOW (ref 135–145)

## 2024-07-01 MED ORDER — HYDROMORPHONE HCL 1 MG/ML IJ SOLN
0.5000 mg | Freq: Once | INTRAMUSCULAR | Status: AC
  Administered 2024-07-01: 0.5 mg via INTRAVENOUS
  Filled 2024-07-01: qty 0.5

## 2024-07-01 NOTE — Plan of Care (Signed)
  Problem: Education: Goal: Knowledge of General Education information will improve Description: Including pain rating scale, medication(s)/side effects and non-pharmacologic comfort measures Outcome: Progressing   Problem: Health Behavior/Discharge Planning: Goal: Ability to manage health-related needs will improve Outcome: Progressing   Problem: Clinical Measurements: Goal: Ability to maintain clinical measurements within normal limits will improve Outcome: Progressing Goal: Will remain free from infection Outcome: Progressing Goal: Diagnostic test results will improve Outcome: Progressing Goal: Respiratory complications will improve Outcome: Progressing Goal: Cardiovascular complication will be avoided Outcome: Progressing   Problem: Activity: Goal: Risk for activity intolerance will decrease Outcome: Progressing   Problem: Nutrition: Goal: Adequate nutrition will be maintained Outcome: Progressing   Problem: Coping: Goal: Level of anxiety will decrease Outcome: Progressing   Problem: Elimination: Goal: Will not experience complications related to bowel motility Outcome: Progressing Goal: Will not experience complications related to urinary retention Outcome: Progressing   Problem: Pain Managment: Goal: General experience of comfort will improve and/or be controlled Outcome: Progressing   Problem: Safety: Goal: Ability to remain free from injury will improve Outcome: Progressing   Problem: Skin Integrity: Goal: Risk for impaired skin integrity will decrease Outcome: Progressing   Problem: Education: Goal: Knowledge of disease or condition will improve Outcome: Progressing Goal: Knowledge of secondary prevention will improve (MUST DOCUMENT ALL) Outcome: Progressing Goal: Knowledge of patient specific risk factors will improve (DELETE if not current risk factor) Outcome: Progressing   Problem: Ischemic Stroke/TIA Tissue Perfusion: Goal: Complications of  ischemic stroke/TIA will be minimized Outcome: Progressing   Problem: Coping: Goal: Will verbalize positive feelings about self Outcome: Progressing Goal: Will identify appropriate support needs Outcome: Progressing

## 2024-07-01 NOTE — Progress Notes (Signed)

## 2024-07-01 NOTE — Progress Notes (Signed)
 Occupational Therapy Treatment Patient Details Name: [name] MRN: [id number] DOB: [birth year] Today's Date: 07/01/2024   History of present illness Pt is an 89 y.o. female presenting 7/5 with RUQ abdominal pain radiating to back, nausea. Found to have suspected cholecystitis, hyponatremia, bacteriuria, hematuria. [facility] ED visit 7/3 Lab work demonstrates acute liver injury but pt leaving without being seen. 7/09 R MCA occlusion; received TNK; to IR with unsuccessful revascularization (repeated reocclusion); remained intubated until 7/10;  PMH: COPD, PAD, HLD, OSA, breast cancer.   OT comments  Patient received in supine with family present. Patient agreeable to OT/PT treatment requiring max assist +2 with patient attempting to assist with moving BLEs but requiring max assist +2. Patient able to sit on EOB with mod and cues to correct posture, alignment. Patient demonstrating improvement with visual scanning to right with frequent cueing. Patient able to stand x2 with max assist +2 from EOB but unable to side step. Patient was max assist +2 to return to supine and performed rolling in bed to change linen with mod/max assist for rolling due to fatigue.  Discharge recommendations changed to [facility] due to improvement with participation and benefit for >3hours/day of therapy. Acute OT to continue to follow to address established goals to facilitate DC to next venue of care.        If plan is discharge home, recommend the following:  Assist for transportation;Assistance with cooking/housework;Two people to help with walking and/or transfers;A lot of help with bathing/dressing/bathroom   Equipment Recommendations  Wheelchair cushion (measurements OT);Wheelchair (measurements OT);Hospital bed    Recommendations for Other Services      Precautions / Restrictions Precautions Precautions: Fall Precaution/Restrictions Comments: Cortrak Restrictions Weight Bearing Restrictions Per Provider Order: No        Mobility Bed Mobility Overal bed mobility: Needs Assistance Bed Mobility: Supine to Sit, Sit to Supine, Rolling Rolling: Mod assist, Max assist, Used rails   Supine to sit: Max assist, +2 for physical assistance Sit to supine: Max assist, +2 for physical assistance   General bed mobility comments: patient requiring assistance with BLEs and trunk with use of bed pad for scooting towards EOB    Transfers Overall transfer level: Needs assistance Equipment used: 2 person hand held assist Transfers: Sit to/from Stand Sit to Stand: Max assist, +2 physical assistance           General transfer comment: Stood x2 with max assist +2 and cues and assistance for posture.     Balance Overall balance assessment: Needs assistance Sitting-balance support: Feet supported Sitting balance-Leahy Scale: Poor Sitting balance - Comments: min to mod assist for sitting balance. lateral leaning performed for WB throught LUE Postural control: Posterior lean Standing balance support: Bilateral upper extremity supported, During functional activity Standing balance-Leahy Scale: Poor Standing balance comment: reliant on therapist for support with knees blocked                           ADL either performed or assessed with clinical judgement   ADL Overall ADL's : Needs assistance/impaired     Grooming: Wash/dry face;Minimal assistance;Sitting Grooming Details (indicate cue type and reason): on EOB                               General ADL Comments: focused on sitting balance    Extremity/Trunk Assessment Upper Extremity Assessment Upper Extremity Assessment: Right hand dominant RUE Deficits /  Details: Grossly 3+/5 MMT RUE Sensation: WNL RUE Coordination: WNL LUE Deficits / Details: No AROM with increased tone noted throughout LUE Sensation: decreased light touch;decreased proprioception LUE Coordination: decreased fine motor;decreased gross motor             Vision       Perception     Praxis     Communication Communication Communication: Impaired Factors Affecting Communication: Reduced clarity of speech   Cognition Arousal: Alert Behavior During Therapy: Flat affect               OT - Cognition Comments: became more lethargic during session                 Following commands: Impaired Following commands impaired: Follows one step commands with increased time      Cueing   Cueing Techniques: Verbal cues, Tactile cues  Exercises      Shoulder Instructions       General Comments VSS    Pertinent Vitals/ Pain       Pain Assessment Pain Assessment: Faces Faces Pain Scale: Hurts little more Pain Location: generalized Pain Descriptors / Indicators: Grimacing, Discomfort Pain Intervention(s): Limited activity within patient's tolerance, Monitored during session, Repositioned  Home Living                                          Prior Functioning/Environment              Frequency  Min 2X/week        Progress Toward Goals  OT Goals(current goals can now be found in the care plan section)  Progress towards OT goals: Progressing toward goals  Acute Rehab OT Goals Patient Stated Goal: get better OT Goal Formulation: With patient/family Time For Goal Achievement: 07/11/24 Potential to Achieve Goals: Good ADL Goals Pt Will Perform Grooming: with min assist;sitting Pt Will Perform Upper Body Bathing: with min assist;sitting Pt Will Perform Upper Body Dressing: with min assist;sitting Pt Will Perform Lower Body Dressing: with mod assist;sitting/lateral leans Pt Will Transfer to Toilet: with min assist;with +2 assist;stand pivot transfer;bedside commode  Plan      Co-evaluation    PT/OT/SLP Co-Evaluation/Treatment: Yes Reason for Co-Treatment: Complexity of the patient's impairments (multi-system involvement);To address functional/ADL transfers PT goals addressed during  session: Mobility/safety with mobility;Balance OT goals addressed during session: ADL's and self-care;Strengthening/ROM      AM-PAC OT 6 Clicks Daily Activity     Outcome Measure   Help from another person eating meals?: Total Help from another person taking care of personal grooming?: A Lot Help from another person toileting, which includes using toliet, bedpan, or urinal?: A Lot Help from another person bathing (including washing, rinsing, drying)?: A Lot Help from another person to put on and taking off regular upper body clothing?: A Lot Help from another person to put on and taking off regular lower body clothing?: A Lot 6 Click Score: 11    End of Session Equipment Utilized During Treatment: Gait belt  OT Visit Diagnosis: Unsteadiness on feet (R26.81);Muscle weakness (generalized) (M62.81);Hemiplegia and hemiparesis Hemiplegia - Right/Left: Left Hemiplegia - dominant/non-dominant: Non-Dominant Hemiplegia - caused by: Cerebral infarction   Activity Tolerance Patient tolerated treatment well;Patient limited by fatigue   Patient Left in bed;with bed alarm set;with family/visitor present   Nurse Communication Mobility status        Time: 1031-1100 OT Time  Calculation (min): 29 min  Charges: OT General Charges $OT Visit: 1 Visit OT Treatments $Therapeutic Activity: 8-22 mins  Dick Laine, OTA Acute Rehabilitation Services  Office [phone]   Jeb LITTIE Laine 07/01/2024, 12:47 PM

## 2024-07-01 NOTE — Progress Notes (Addendum)
 37 STROKE TEAM PROGRESS NOTE   BRIEF HPI [name] is a 89 y.o. female with history of COPD, PAD, HLD, OSA, right breast carcinoma stage Ia s/p lumpectomy, radiation, and hormone therapy in remission admitted initially on 7/5 with RUQ abdominal pain, nausea, vomiting with initial workup revealing elevated LFTs and possible cholecystitis/E. coli bacteremia s/p 5 days of ceftriaxone .  Hospital course complicated by hallucinations, encephalopathy, some difficulty breathing treated with diuretics. Patient developed a sudden onset of left-sided weakness with a right gaze deviation and slurred speech.  SIGNIFICANT HOSPITAL EVENTS 7/5: Admitted for RUQ pain, nausea, vomiting E. Coli bacteremia treated with 5 days of ceftriaxone  7/9: 18:15 daughter witnessed patient with sudden onset slurred speech, left-sided weakness, and right gaze deviation for which a Code Stroke was activated - TNKase  administered (no contraindication per primary team and surgery) - CTH with right MCA M1 occlusion, taken to IR for thrombectomy with TICI 2B revacaularization - Remained intubated for airway protection and admitted to the ICU overnight   - Sedation paused this morning with some prolonged sedating effects  7/10:Extubated 1230 7/11: corTrak placed, started on ASA  INTERIM HISTORY/SUBJECTIVE All 3 daughters are at the bedside.  Patient is laying in the bed in no apparent distress.  Therapy has just finished working with her.  Therapist feels she has had some improvement we will recommend CIR to evaluate No new neurological events overnight Labs and vitals are stable  CBC    Component Value Date/Time   WBC 10.2 07/01/2024 0502   RBC 3.38 (L) 07/01/2024 0502   HGB 10.4 (L) 07/01/2024 0502   HGB 13.2 05/05/2024 1343   HCT 31.8 (L) 07/01/2024 0502   PLT 290 07/01/2024 0502   PLT 150 05/05/2024 1343   MCV 94.1 07/01/2024 0502   MCH 30.8 07/01/2024 0502   MCHC 32.7 07/01/2024 0502   RDW 13.2 07/01/2024  0502   LYMPHSABS 0.8 06/26/2024 0530   MONOABS 0.9 06/26/2024 0530   EOSABS 0.2 06/26/2024 0530   BASOSABS 0.1 06/26/2024 0530   BMET    Component Value Date/Time   NA 133 (L) 07/01/2024 0502   K 4.3 07/01/2024 0502   CL 100 07/01/2024 0502   CO2 23 07/01/2024 0502   GLUCOSE 117 (H) 07/01/2024 0502   BUN 19 07/01/2024 0502   CREATININE 0.55 07/01/2024 0502   CREATININE 0.74 05/05/2024 1343   CALCIUM  9.1 07/01/2024 0502   GFRNONAA >60 07/01/2024 0502   GFRNONAA >60 05/05/2024 1343   Lab Results  Component Value Date   HGBA1C 5.1 06/26/2024    Lab Results  Component Value Date   CHOL 96 06/26/2024   HDL 35 (L) 06/26/2024   LDLCALC 46 06/26/2024   TRIG 74 06/26/2024   TRIG 74 06/26/2024   CHOLHDL 2.7 06/26/2024   Drugs of Abuse  No results found for: LABOPIA, COCAINSCRNUR, LABBENZ, AMPHETMU, THCU, LABBARB   Urinalysis    Component Value Date/Time   COLORURINE YELLOW 06/21/2024 1532   APPEARANCEUR CLEAR 06/21/2024 1532   LABSPEC 1.013 06/21/2024 1532   PHURINE 7.0 06/21/2024 1532   GLUCOSEU NEGATIVE 06/21/2024 1532   HGBUR MODERATE (A) 06/21/2024 1532   BILIRUBINUR NEGATIVE 06/21/2024 1532   KETONESUR NEGATIVE 06/21/2024 1532   PROTEINUR NEGATIVE 06/21/2024 1532   UROBILINOGEN 1.0 11/01/2007 1614   NITRITE POSITIVE (A) 06/21/2024 1532   LEUKOCYTESUR NEGATIVE 06/21/2024 1532   IMAGING past 24 hours No results found.  Vitals:   06/30/24 1959 07/01/24 0732 07/01/24 0841 07/01/24 1048  BP: (!) 144/37 (!) 133/39 106/76 106/76  Pulse: 60 (!) 59 68   Resp: 17 16 20    Temp: 99.2 F (37.3 C) 97.6 F (36.4 C)    TempSrc: Oral Oral    SpO2: 97% 98% 98%   Weight:      Height:       PHYSICAL EXAM General:  Critically ill appearing, elderly, Caucasian female laying in bed CV: Sinus bradycardia with rate in the 50's Respiratory: Room air GI: Abdomen soft and nontender  NEURO:  Mental Status: awake and alert.  She is oriented to self, age, month  situation and place. She can follow commands.   Cranial Nerves:  II: PERRL.  left hemianopsia, no blink to threat  III, IV, VI: Right gaze preference, will track towards midline but will not cross midline towards the left V: Does not respond to light touch on either side of the face VII: left facial droop  VIII: Hearing is intact to voice. IX, X: cough present  XI: Head is grossly midline XII: midline Motor: Left upper extremity is flaccid with withdrawal of the left lower extremity. Right arm and right leg spontaneous movement Sensation: decreased on left  Coordination: unable to assess Gait: Deferred  ASSESSMENT/PLAN Stroke:  right MCA territory infarct with R M1 occlusion s/p TNK and IR with TICI 2B initially but persistent occlusion eventually, etiology:  most likely large vessel disease    Code Stroke CT head No CT evidence of acute intracranial abnormality. ASPECTS is 10. CTA head & neck Abrupt occlusion of the distal M1 segment of the right middle cerebral artery S/p IR with repeated  reocclusion secondary to underlying long segment atherosclerotic plaque and final score TICI2b Post IR CT small focal right perisylvian subarachnoid hemorrhage. MRI brain  Evolving right MCA territory infarct. Mild associated petechial blood products at the right frontoparietal region. No significant regional mass effect. 2. Small volume acute subarachnoid hemorrhage at the base of the right Sylvian fissure.   MRA Persistent and/or recurrent proximal right M2 occlusion, superior division.  2D Echo LVEF 60-65%, mild atrial dilation LDL 46 HgbA1c 5.1 VTE prophylaxis - heparin  subq aspirin  81 mg daily prior to admission, now on aspirin  81 mg daily given [facility] Therapy recommendations:  SNF Disposition:  Pending  ? Hx of Stroke/TIA Acute onset of vertigo and gait disturbance in June 2018 with residual truncal ataxia requiring use of walker for ambulation  Concern for possible brainstem/cerebellar  stroke at that time  Recommended for MRI evaluation at that time but this was lost to follow up   Acute Acalculous Cholecystitis E. Coli bacteremia Elevated LFTs, resolving Admitted for right upper quadrant pain and nausea vomiting. 7/5 blood cultures + for E. Coli S/p Flagyl  Continue Cefadoxil  AST/ALT admission 1658/958 -> ...-> 25/57->37/60 Less likely endocarditis given the bacteria species  HTN Home meds:  None Stable Add PRN labetalol , hydralazine  for BP control  Blood pressure goal SBP <160 given SAH Long term BP goal: Normotension  Hyperlipidemia Home meds:  atorvastatin  10 mg LDL 46, goal < 70 Held inpatient prior to transfer to skilled nursing facility if family desires.  Greater than 50% time during this 35-minute visit was spent in counseling and coordination of care about a stroke and dysphagia discussion with patient and family and answering questions.  Elevated LFTs High intensity statin not indicated as patient is at goal at lower dose Consider resume statin once po access and LFT normalizes  Dysphagia Patient has post-stroke dysphagia SLP consult  Did not pass swallow, Currently n.p.o. S/p cortrak  Continue TF @ 45  Post-op Respiratory Insufficiency,improved COPD  Was concerning for fluid overload status post diuretics Extubated 7/10, tolerating well  Other Stroke Risk Factors Advanced age Family hx stroke (possible stroke versus MI in patient's father) Remote tobacco use history 56-pack-year history, stopped smoking 8 years ago CAD/PAD Obstructive sleep apnea, on CPAP at home though reported not using effectively PTA  Other Active Problems Anemia, Hbg 13.8 on admission > 10.6 Depression on Lexapro   Hospital day # 10   Karna Geralds DNP, ACNPC-AG  [facility] I have personally obtained history,examined this patient, reviewed notes, independently viewed imaging studies, participated in medical decision making and plan of care.ROS  completed by me personally and pertinent positives fully documented  I have made any additions or clarifications directly to the above note. Agree with note above.  Continue ongoing therapies.  Will consult inpatient rehab to see if she is a candidate.  Continue tube feeds.  Discussed with daughters at the bedside and answered questions.  Eather Popp, MD Medical Director [facility] Pager: [phone] 07/01/2024 1:33 PM  To contact Stroke Continuity provider, please refer to [email or web address]. After hours, contact General Neurology

## 2024-07-01 NOTE — Progress Notes (Signed)
 Physical Therapy Treatment Patient Details Name: [name] MRN: [id number] DOB: [birth year] Today's Date: 07/01/2024   History of Present Illness Pt is an 89 y.o. female presenting 7/5 with RUQ abdominal pain radiating to back, nausea. Found to have suspected cholecystitis, hyponatremia, bacteriuria, hematuria. [facility] ED visit 7/3 Lab work demonstrates acute liver injury but pt leaving without being seen. 7/09 R MCA occlusion; received TNK; to IR with unsuccessful revascularization (repeated reocclusion); remained intubated until 7/10;  PMH: COPD, PAD, HLD, OSA, breast cancer.    PT Comments  Pt received in supine and agreeable to session. Pt requires max A +2 for bed mobility and transfers due to weakness and poor balance. Pt requires dense cues for sitting balance due to posterior lean requiring min-mod A. Pt able to tolerate 2 standing trials, but is unable to take steps despite assist for weight shifting. Increased cues for L attention and following commands. Updated discharge recommendations due to pt's improved participation and activity tolerance. Patient will benefit from intensive inpatient follow-up therapy, >3 hours/day to maximize progress towards functional mobility goals. Acutely, pt continues to benefit from PT services to progress toward functional mobility goals.    If plan is discharge home, recommend the following: Assist for transportation;Supervision due to cognitive status;Two people to help with walking and/or transfers;Assistance with cooking/housework;Assistance with feeding;Direct supervision/assist for medications management;Direct supervision/assist for financial management;Help with stairs or ramp for entrance   Can travel by private vehicle        Equipment Recommendations  Wheelchair (measurements PT);Wheelchair cushion (measurements PT);Hoyer lift    Recommendations for Other Services       Precautions / Restrictions Precautions Precautions:  Fall Precaution/Restrictions Comments: Cortrak Restrictions [employer] Bearing Restrictions Per Provider Order: No     Mobility  Bed Mobility Overal bed mobility: Needs Assistance Bed Mobility: Supine to Sit, Sit to Supine, Rolling Rolling: Mod assist, Max assist, Used rails   Supine to sit: Max assist, +2 for physical assistance Sit to supine: Max assist, +2 for physical assistance   General bed mobility comments: patient requiring assistance with BLEs and trunk with use of bed pad for scooting towards EOB    Transfers Overall transfer level: Needs assistance Equipment used: 2 person hand held assist Transfers: Sit to/from Stand Sit to Stand: Max assist, +2 physical assistance           General transfer comment: Stood x2 from EOB with max assist +2 for power up and L knee block. cues and assistance for upright posture.    Ambulation/Gait                   Stairs             Wheelchair Mobility     Tilt Bed    Modified Rankin (Stroke Patients Only) Modified Rankin (Stroke Patients Only) Pre-Morbid Rankin Score: Moderate disability Modified Rankin: Severe disability     Balance Overall balance assessment: Needs assistance Sitting-balance support: Feet supported Sitting balance-Leahy Scale: Poor Sitting balance - Comments: min to mod assist for sitting balance. lateral leaning performed for WB throught LUE Postural control: Posterior lean Standing balance support: Bilateral upper extremity supported, During functional activity Standing balance-Leahy Scale: Zero Standing balance comment: reliant on therapist for support with knees blocked                            Communication Communication Communication: Impaired Factors Affecting Communication: Reduced clarity of speech  Cognition  Arousal: Alert Behavior During Therapy: Flat affect   PT - Cognitive impairments: Attention, Problem solving, Safety/Judgement                          Following commands: Impaired Following commands impaired: Follows one step commands with increased time    Cueing Cueing Techniques: Verbal cues, Tactile cues  Exercises      General Comments General comments (skin integrity, edema, etc.): VSS      Pertinent Vitals/Pain Pain Assessment Pain Assessment: Faces Faces Pain Scale: Hurts little more Pain Location: generalized Pain Descriptors / Indicators: Grimacing, Discomfort Pain Intervention(s): Limited activity within patient's tolerance, Monitored during session, Repositioned     PT Goals (current goals can now be found in the care plan section) Acute Rehab PT Goals Patient Stated Goal: rest PT Goal Formulation: With patient/family Time For Goal Achievement: 07/10/24 Progress towards PT goals: Progressing toward goals    Frequency    Min 2X/week      PT Plan      Co-evaluation PT/OT/SLP Co-Evaluation/Treatment: Yes Reason for Co-Treatment: Complexity of the patient's impairments (multi-system involvement);To address functional/ADL transfers PT goals addressed during session: Mobility/safety with mobility;Balance OT goals addressed during session: ADL's and self-care;Strengthening/ROM      AM-PAC PT 6 Clicks Mobility   Outcome Measure  Help needed turning from your back to your side while in a flat bed without using bedrails?: A Lot Help needed moving from lying on your back to sitting on the side of a flat bed without using bedrails?: Total Help needed moving to and from a bed to a chair (including a wheelchair)?: Total Help needed standing up from a chair using your arms (e.g., wheelchair or bedside chair)?: Total Help needed to walk in hospital room?: Total Help needed climbing 3-5 steps with a railing? : Total 6 Click Score: 7    End of Session Equipment Utilized During Treatment: Gait belt Activity Tolerance: Patient limited by fatigue;Patient tolerated treatment well Patient left: with  call bell/phone within reach;with family/visitor present;in bed;with bed alarm set Nurse Communication: Mobility status PT Visit Diagnosis: Unsteadiness on feet (R26.81);Hemiplegia and hemiparesis Hemiplegia - Right/Left: Left Hemiplegia - dominant/non-dominant: Non-dominant Hemiplegia - caused by: Cerebral infarction     Time: 1031-1100 PT Time Calculation (min) (ACUTE ONLY): 29 min  Charges:    $Therapeutic Activity: 8-22 mins PT General Charges $$ ACUTE PT VISIT: 1 Visit                     Darryle George, PTA Acute Rehabilitation Services Secure Chat Preferred  [phone]    Darryle George 07/01/2024, 1:53 PM

## 2024-07-01 NOTE — TOC Progression Note (Signed)
 Transition of Care [facility]) - Progression Note    Patient Details  Name: [name] MRN: [id number] Date of Birth: 28-Mar-1932  [facility] CM/SW Contact  [name], [name] Phone Number: 07/01/2024, 11:13 AM  Clinical Narrative:   CSW continuing to follow for rehab placement when medically stable. Patient continues NPO with cortrak, awaiting decision on peg placement prior to beginning SNF search. CSW to follow.    Expected Discharge Plan: Skilled Nursing Facility Barriers to Discharge: Continued Medical Work up, English as a second language teacher, SNF Pending bed offer  Expected Discharge Plan and Services   Discharge Planning Services: CM Consult Post Acute Care Choice: Skilled Nursing Facility Living arrangements for the past 2 months: Single Family Home                           HH Arranged: RN, OT, PT, Nurse's Aide HH Agency: [facility] Date [facility] Agency Contacted: 06/25/24 Time HH Agency Contacted: 1007 Representative spoke with at [facility] Agency: Darleene   Social Determinants of Health (SDOH) Interventions SDOH Screenings   Food Insecurity: No Food Insecurity (06/22/2024)  Housing: Low Risk  (06/22/2024)  Transportation Needs: No Transportation Needs (06/22/2024)  Utilities: Not At Risk (06/22/2024)  Social Connections: Socially Isolated (06/22/2024)  Tobacco Use: Medium Risk (06/21/2024)    Readmission Risk Interventions     No data to display

## 2024-07-02 DIAGNOSIS — I10 Essential (primary) hypertension: Secondary | ICD-10-CM

## 2024-07-02 DIAGNOSIS — R8271 Bacteriuria: Secondary | ICD-10-CM | POA: Diagnosis not present

## 2024-07-02 DIAGNOSIS — I69391 Dysphagia following cerebral infarction: Secondary | ICD-10-CM | POA: Diagnosis not present

## 2024-07-02 DIAGNOSIS — E785 Hyperlipidemia, unspecified: Secondary | ICD-10-CM | POA: Diagnosis not present

## 2024-07-02 DIAGNOSIS — I63511 Cerebral infarction due to unspecified occlusion or stenosis of right middle cerebral artery: Secondary | ICD-10-CM | POA: Diagnosis not present

## 2024-07-02 DIAGNOSIS — K819 Cholecystitis, unspecified: Secondary | ICD-10-CM | POA: Diagnosis not present

## 2024-07-02 DIAGNOSIS — R1312 Dysphagia, oropharyngeal phase: Secondary | ICD-10-CM

## 2024-07-02 DIAGNOSIS — R7989 Other specified abnormal findings of blood chemistry: Secondary | ICD-10-CM | POA: Diagnosis not present

## 2024-07-02 DIAGNOSIS — I779 Disorder of arteries and arterioles, unspecified: Secondary | ICD-10-CM | POA: Diagnosis not present

## 2024-07-02 LAB — GLUCOSE, CAPILLARY
Glucose-Capillary: 111 mg/dL — ABNORMAL HIGH (ref 70–99)
Glucose-Capillary: 113 mg/dL — ABNORMAL HIGH (ref 70–99)
Glucose-Capillary: 134 mg/dL — ABNORMAL HIGH (ref 70–99)
Glucose-Capillary: 136 mg/dL — ABNORMAL HIGH (ref 70–99)
Glucose-Capillary: 138 mg/dL — ABNORMAL HIGH (ref 70–99)

## 2024-07-02 MED ORDER — ACETAMINOPHEN-CODEINE 300-30 MG PO TABS
1.0000 | ORAL_TABLET | Freq: Four times a day (QID) | ORAL | Status: DC | PRN
  Administered 2024-07-02 – 2024-07-07 (×14): 1
  Filled 2024-07-02 (×15): qty 1

## 2024-07-02 MED ORDER — POLYVINYL ALCOHOL 1.4 % OP SOLN: 1.0000 [drp] | OPHTHALMIC | Status: DC | PRN

## 2024-07-02 NOTE — Progress Notes (Addendum)
 Inpatient Rehab Admissions:  Inpatient Rehab Consult received.  I met with patient and her daughter Lamarr at the bedside for rehabilitation assessment and to discuss goals and expectations of an inpatient rehab admission.  Discussed average length of stay, insurance authorization requirement and discharge home after completion of CIR. Also discussed that [facility] requested a physiatrist assess pt  to help determine appropriateness of CIR. Lamarr acknowledged understanding. She requested that [facility] contact her sister [name]. Attempted to contact [name]. No one answered. Not able to leave a message. Will continue to follow.    1547:Spoke with pt's daughter [name] on the phone. Discussed CIR goals and expectations. Discussed average length of stay, insurance authorization requirement and discharge home after completion of CIR. Also discussed physiatrist consult recommendations. [name] acknowledged understanding and is supportive of pt pursuing CIR. She confirmed that she, her husband and her sisters will be able to provide 24/7 support for pt after discharge. Will continue to follow progress and participation with therapies to help determine appropriateness of CIR.   Signed: Tinnie Yvone Cohens, MS, CCC-SLP Admissions Coordinator [phone]

## 2024-07-02 NOTE — PMR Pre-admission (Signed)
 PMR Admission Coordinator Pre-Admission Assessment  Patient: [name] is an 89 y.o., female MRN: [id number] DOB: May 02, 1932 Height: 5' 1 (154.9 cm) Weight: 71.7 kg             Insurance Information HMO:     PPO: yes     PCP:      IPA:      80/20:      OTHER:  PRIMARY: [employer] Choice PPO      Policy#: [id number]  [facility] group # [id number]    Subscriber: patient CM Name: Luke Barefoot      Phone#: [phone] ext [phone]     Fax#: [phone] Pre-Cert#: [id number] approved for 7 days 7/22 until 7/29 with flu Loraine phone [phone] ext [phone] fax [phone]      Employer:  Benefits:  Phone #: [phone]     Name: 7/21 Eff. Date: 12/19/23-still active     Deduct: does not have      Out of Pocket Max: $4,000 ($460.44 met)       CIR: $160/day co pay with max copay of $1,600 (10 days)      SNF: $0/day for first 20 days Outpatient: $20/co pay per visit      Home Health: 100% coverage       DME: 80% coverage     Providers: in-network SECONDARY:       Policy#:       Phone#:   Artist:       Phone#:   The Engineer, materials Information Summary" for patients in Inpatient Rehabilitation Facilities with attached "Privacy Act Statement-Health Care Records" was provided and verbally reviewed with: Patient and Family  Emergency Contact Information Contact Information     Name Relation Home Work Jackson Daughter [phone]  [phone]   Lazaro Pals Daughter   [phone]   Nita Collar Daughter   [phone]      Other Contacts   None on File    Current Medical History  Patient Admitting Diagnosis:Right MCA CVA  History of Present Illness: Pt is an 89 year old female with medical hx significant for: COPD, depression/anxiety, breast CA. Pt presented to [facility] on 06/21/24 d/t 1 week h/o nausea and vomiting as well as generalized weakness. Pt presented to [facility] on 7/3 but left prior to being seen d/t wait times. Lab  work demonstrated acute liver injury. PCP referred her to [facility].   Bacteria and positive nitrates on UA; started on IV antibiotics. CT abdomen/pelvis showed gallbladder distention with trace pericholecystic fluid versus wall thickening; ultrasound of RUQ ordered. Imaging negative for cholelithiasis or choledocholithiasis. Left hepatic lobe lesion noted, thought to be hemangioma. Surgery consulted. Blood cultures positive for E.coli and Enterobacterales. HIDA negative. No surgical interventions warranted.   Code stroke activated 06/25/24. CT head negative. CTA head/neck showed right MCA M1 occlusion. TNK administered. Pt taken emergently to IR for thrombectomy. Revascularization was completed but repeatedly re occluded. Post procedure CT showed small SAH. MRI showed small volume SAH. Pt extubated on 06/26/24. Cortrak placed 06/27/24.   Now on ASA given SAH. Will add Plavix for no further procedures are planned. Home meds Atorvastatin  with LDL 46. Now on Lipitor.On no HTN meds at home. Prn Labetalol  and hydralazine  added. MBS 7/22 results pending. On CPAP at home pta but reported not using effectively. Lower back pain on Tylenol  and Flexeril  as needed. Added Tramadol  also.   Complete NIHSS TOTAL: 18 Glasgow Coma Scale Score: 15  Patient's  medical record from [facility] has been reviewed by the rehabilitation admission coordinator and physician.   Past Medical History  Past Medical History:  Diagnosis Date   Anxiety    Back pain    Cancer (HCC)    Depression    Emphysema    Esophageal spasm    Lung nodule    OSA (obstructive sleep apnea)    Osteomalacia    Personal history of radiation therapy    Vertigo    Vitamin D  deficiency    Has the patient had major surgery during 100 days prior to admission? Yes  Family History  family history includes Breast cancer in her maternal aunt; Cancer in her paternal uncle; Diabetes type II in her sister and another family member; Hypertension  in an other family member; Other in her father and mother.  Current Medications   Current Facility-Administered Medications:    acetaminophen  (TYLENOL ) tablet 650 mg, 650 mg, Oral, Q4H PRN, 650 mg at 07/05/24 1430 **OR** acetaminophen  (TYLENOL ) 160 MG/5ML solution 650 mg, 650 mg, Per Tube, Q4H PRN, 650 mg at 07/06/24 1415 **OR** acetaminophen  (TYLENOL ) suppository 650 mg, 650 mg, Rectal, Q4H PRN, Deveshwar, Sanjeev, MD   acetaminophen -codeine  (TYLENOL  #3) 300-30 MG per tablet 1 tablet, 1 tablet, Per Tube, Q6H PRN, Waddell Aquas A, NP, 1 tablet at 07/07/24 2254   albuterol  (PROVENTIL ) (2.5 MG/3ML) 0.083% nebulizer solution 2.5 mg, 2.5 mg, Nebulization, Q4H PRN, Segars, Jonathan, MD, 2.5 mg at 06/25/24 0314   artificial tears ophthalmic solution 1 drop, 1 drop, Both Eyes, PRN, Waddell Aquas A, NP   aspirin  chewable tablet 81 mg, 81 mg, Per Tube, Daily, Jerri Pfeiffer, MD, 81 mg at 07/08/24 9066   atorvastatin  (LIPITOR) tablet 10 mg, 10 mg, Per Tube, Daily, Jerri Pfeiffer, MD, 10 mg at 07/08/24 9068   Chlorhexidine  Gluconate Cloth 2 % PADS 6 each, 6 each, Topical, Daily, Lindzen, Eric, MD, 6 each at 07/08/24 0931   cyclobenzaprine  (FLEXERIL ) tablet 5 mg, 5 mg, Per Tube, Q8H PRN, Jerri Pfeiffer, MD, 5 mg at 07/07/24 0143   diclofenac  Sodium (VOLTAREN ) 1 % topical gel 2 g, 2 g, Topical, QID, Sethi, Pramod S, MD, 2 g at 07/08/24 9068   escitalopram  (LEXAPRO ) tablet 40 mg, 40 mg, Per Tube, Daily, Ilah Krabbe M, PA-C, 40 mg at 07/08/24 0931   feeding supplement (OSMOLITE 1.5 CAL) liquid 1,000 mL, 1,000 mL, Per Tube, Continuous, Rosemarie Eather RAMAN, MD, Last Rate: 40 mL/hr at 07/07/24 2315, 1,000 mL at 07/07/24 2315   feeding supplement (PROSource TF20) liquid 60 mL, 60 mL, Per Tube, Daily, Jerri Pfeiffer, MD, 60 mL at 07/08/24 0931   Gerhardt's butt cream, , Topical, TID, Rosemarie Eather RAMAN, MD, Given at 07/08/24 0931   heparin  injection 5,000 Units, 5,000 Units, Subcutaneous, Q8H, Jerri Pfeiffer, MD, 5,000 Units at  07/08/24 0511   hydrALAZINE  (APRESOLINE ) injection 5-10 mg, 5-10 mg, Intravenous, Q4H PRN, Toberman, Stevi W, NP   labetalol  (NORMODYNE ) injection 5-10 mg, 5-10 mg, Intravenous, Q4H PRN, Toberman, Stevi W, NP   lidocaine  (XYLOCAINE ) 5 % ointment, , Topical, Daily PRN, Khaliqdina, Salman, MD   melatonin tablet 6 mg, 6 mg, Per Tube, QHS PRN, Ilah Krabbe M, PA-C, 6 mg at 07/07/24 0143   multivitamin with minerals tablet 1 tablet, 1 tablet, Per Tube, Daily, Jerri Pfeiffer, MD, 1 tablet at 07/08/24 0931   nitroGLYCERIN  (NITROSTAT ) SL tablet 0.4 mg, 0.4 mg, Sublingual, Q5 min PRN, Sheikh, Omair Latif, DO   ondansetron  (ZOFRAN ) injection 4 mg,  4 mg, Intravenous, Q6H PRN, Segars, Jonathan, MD, 4 mg at 07/04/24 2057   Oral care mouth rinse, 15 mL, Mouth Rinse, 4 times per day, Jerri Pfeiffer, MD, 15 mL at 07/08/24 0749   Oral care mouth rinse, 15 mL, Mouth Rinse, PRN, Jerri Pfeiffer, MD   phenol (CHLORASEPTIC) mouth spray 2 spray, 2 spray, Mouth/Throat, PRN, Waddell Aquas A, NP, 2 spray at 07/03/24 1404   polyethylene glycol (MIRALAX  / GLYCOLAX ) packet 17 g, 17 g, Per Tube, Daily PRN, Ilah Krabbe M, PA-C, 17 g at 06/29/24 9147   revefenacin  (YUPELRI ) nebulizer solution 175 mcg, 175 mcg, Nebulization, Daily, Ilah Krabbe M, PA-C, 175 mcg at 07/08/24 9189   sodium chloride  flush (NS) 0.9 % injection 3 mL, 3 mL, Intravenous, Q12H, Segars, Dorn, MD, 3 mL at 07/08/24 0932   traMADol  (ULTRAM ) tablet 50 mg, 50 mg, Oral, Q12H PRN, Jerri Pfeiffer, MD, 50 mg at 07/07/24 1414   white petrolatum  (VASELINE) gel, , Topical, PRN, Merilee Linsey I, RPH  Patients Current Diet:  Diet Order             Diet NPO time specified Except for: Ice Chips  Diet effective now                  Precautions / Restrictions Precautions Precautions: Fall Precaution/Restrictions Comments: Cortrak Restrictions Weight Bearing Restrictions Per Provider Order: No   Has the patient had 2 or more falls or a fall with  injury in the past year?No  Prior Activity Level Community (5-7x/wk): gets out of house ~3-4 days/week  Prior Functional Level Prior Function Prior Level of Function : Independent/Modified Independent, Needs assist Mobility Comments: use of RW or cane in the home ADLs Comments: PCA prepared breakfaast/lunch, laundry, set-up for medication management, grocery shopping, supervises showers, drives to appts. Pt otherwise mod I in ADL  Self Care: Did the patient need help bathing, dressing, using the toilet or eating?  Independent  Indoor Mobility: Did the patient need assistance with walking from room to room (with or without device)? Independent  Stairs: Did the patient need assistance with internal or external stairs (with or without device)? Independent  Functional Cognition: Did the patient need help planning regular tasks such as shopping or remembering to take medications? Independent  Patient Information Are you of Hispanic, Latino/a,or Spanish origin?: X. Patient unable to respond, A. No, not of Hispanic, Latino/a, or Spanish origin What is your race?: X. Patient unable to respond, A. White Do you need or want an interpreter to communicate with a doctor or health care staff?: 9. Unable to respond  Patient's Response To:  Health Literacy and Transportation Is the patient able to respond to health literacy and transportation needs?: Yes Health Literacy - How often do you need to have someone help you when you read instructions, pamphlets, or other written material from your doctor or pharmacy?: Sometimes In the past 12 months, has lack of transportation kept you from medical appointments or from getting medications?: No In the past 12 months, has lack of transportation kept you from meetings, work, or from getting things needed for daily living?: No  Home Assistive Devices / Equipment Home Equipment: Agricultural consultant (2 wheels), Rollator (4 wheels), [employer] - single point, [employer] -  built in, [employer] - tub/shower, Hand held shower head, Grab bars - toilet, Transport chair  Prior Device Use: Indicate devices/aids used by the patient prior to current illness, exacerbation or injury? Walker and cane  Current Functional Level  Cognition  Arousal/Alertness: Awake/alert Overall Cognitive Status: Impaired/Different from baseline Orientation Level: Oriented X4 Attention: Sustained Sustained Attention: Impaired Sustained Attention Impairment: Verbal basic, Functional basic Memory: Impaired Memory Impairment: Decreased recall of new information Awareness: Impaired Awareness Impairment: Intellectual impairment Problem Solving: Impaired Problem Solving Impairment: Functional basic    Extremity Assessment (includes Sensation/Coordination)  Upper Extremity Assessment: Right hand dominant RUE Deficits / Details: Grossly 3+/5 MMT RUE Sensation: WNL RUE Coordination: WNL LUE Deficits / Details: Pt with trace shoulder adduction, possibly just tone given inconsistency BUT pt also very fatigued and hard to discern. LUE Sensation: decreased light touch, decreased proprioception LUE Coordination: decreased fine motor, decreased gross motor  Lower Extremity Assessment: Defer to PT evaluation RLE Deficits / Details: moving against gravity; grossly 3+ to 4 throughout LLE Deficits / Details: able to wiggle toes, AAROM knee flexion and extension LLE Sensation: decreased light touch    ADLs  Overall ADL's : Needs assistance/impaired Eating/Feeding: NPO Eating/Feeding Details (indicate cue type and reason): assistance with opening containers Grooming: Sitting, Wash/dry face, Minimal assistance Grooming Details (indicate cue type and reason): supported sitting in recliner, min A for throughness. Pt washing L side of face with RUE. Upper Body Bathing: Total assistance, Sitting Upper Body Bathing Details (indicate cue type and reason): on EOB Lower Body Bathing: Total assistance, Bed  level Upper Body Dressing : Total assistance Upper Body Dressing Details (indicate cue type and reason): donned gown to cover back Lower Body Dressing: Total assistance, Bed level Lower Body Dressing Details (indicate cue type and reason): patient able to doff socks with mod assist to donn s Toilet Transfer: Total assistance Toilet Transfer Details (indicate cue type and reason): simulated with transfer from recliner to bed. Toileting- Clothing Manipulation and Hygiene: Total assistance, Bed level Toileting - Clothing Manipulation Details (indicate cue type and reason): to bring depends underwear around hips Functional mobility during ADLs: Contact guard assist, Rolling walker (2 wheels) General ADL Comments: Pt needing total A for ADLs and transfers at this time. Limited initation for ADLs and very lethargic to perform much.    Mobility  Overal bed mobility: Needs Assistance Bed Mobility: Sit to Supine Rolling: Mod assist, Max assist, Used rails Supine to sit: Max assist, +2 for physical assistance, +2 for safety/equipment Sit to supine: Total assist, Used rails General bed mobility comments: Cues for pt to utilize bed rail with RUE and pull/shift weight R to address L lean.    Transfers  Overall transfer level: Needs assistance Equipment used: 2 person hand held assist Transfers: Sit to/from Stand, Bed to chair/wheelchair/BSC Sit to Stand: Total assist Bed to/from chair/wheelchair/BSC transfer type:: Stand pivot Stand pivot transfers: Total assist Step pivot transfers: Contact guard assist General transfer comment: Attempted to use stedy but pt unable to hold anterior lean without significant Ext support and has very little strength to initiate offloading bottom.    Ambulation / Gait / Stairs / Wheelchair Mobility  Ambulation/Gait Ambulation/Gait assistance: Clinical research associate (Feet): 100 Feet Assistive device: Rolling walker (2 wheels) Gait Pattern/deviations:  Step-to pattern, Decreased stride length General Gait Details: short step length, 2 standing rest breaks Gait velocity: dec Gait velocity interpretation: <1.31 ft/sec, indicative of household ambulator    Posture / Balance Dynamic Sitting Balance Sitting balance - Comments: max A for sitting balance due to L lateral lean, pt using R rail to hold onto and correct lean slightly. Balance Overall balance assessment: Needs assistance Sitting-balance support: Feet supported, Single extremity supported, No upper extremity  supported Sitting balance-Leahy Scale: Poor Sitting balance - Comments: max A for sitting balance due to L lateral lean, pt using R rail to hold onto and correct lean slightly. Postural control: Left lateral lean Standing balance support: Bilateral upper extremity supported, During functional activity, Reliant on assistive device for balance Standing balance-Leahy Scale: Zero Standing balance comment: total A    Special needs/care consideration Skin Abrasion: lip/bilateral; Ecchymosis: lip/bilateral; Erythema/Redness: buttocks, sacrum/bilateral, Bowel and Bladder incontinence, External Urinary Catheter, Cortrak   Previous Home Environment  Living Arrangements: Alone  Lives With: Alone Available Help at Discharge:  (caregiver, Doyal , would increase hrs from 5 to 8 and family would cover to provide 24/7 physical assist) Type of Home:  (town house) Home Layout: One level Home Access: Level entry [employer] Shower/Tub: Psychologist, counselling, Door [employer] Toilet: Handicapped height Bathroom Accessibility: Yes How Accessible: Accessible via walker, Accessible via wheelchair Home Care Services: Yes Type of Home Care Services: Homehealth aide (5 hrs per day  during the weekday) Home Care Agency (if known): private  Discharge Living Setting Plans for Discharge Living Setting: Patient's home, Other (Comment), Alone (townhouse) Type of Home at Discharge:  (townhome) Discharge Home  Layout: One level Discharge Home Access: Level entry Discharge Bathroom Shower/Tub: Walk-in shower Discharge Bathroom Toilet: Handicapped height Discharge Bathroom Accessibility: Yes How Accessible: Accessible via walker, Accessible via wheelchair Does the patient have any problems obtaining your medications?: No  Social/Family/Support Systems Patient Roles: Parent Contact Information: [name], main contact Anticipated Caregiver: daughters and caregiver, Doyal Anticipated Industrial/product designer Information: [phone] Caregiver Availability: 24/7 Discharge Plan Discussed with Primary Caregiver: Yes Is Caregiver In Agreement with Plan?: Yes Does Caregiver/Family have Issues with Lodging/Transportation while Pt is in Rehab?: Yes  Goals Patient/Family Goal for Rehab: min to mod assist at wheelchair level for PT and OT, min for SLP Expected length of stay: ELOS 16 to 20 days Pt/Family Agrees to Admission and willing to participate: Yes Program Orientation Provided & Reviewed with Pt/Caregiver Including Roles  & Responsibilities: Yes  Decrease burden of Care through IP rehab admission: NA  Possible need for SNF placement upon discharge: Possible if patient does not reach a wheelchair level that family felt they could manage. I discussed with dtr, Randall, on 7/18, the difficulty in obtaining insurance Auth for SNF after a CIR admit as well as dtrs [name] and Kalida on 7/22  Patient Condition: This patient's condition remains as documented in the consult dated 07/02/24, in which the Rehabilitation Physician determined and documented that the patient's condition is appropriate for intensive rehabilitative care in an inpatient rehabilitation facility pending improvement in tolerance with therapy. These areas have been addressed.  Will admit to inpatient rehab today.  Preadmission Screen Completed By:  Heron Leavell RN MSN, 07/08/2024 11:35  AM ______________________________________________________________________   Discussed status with Dr. Cornelio on 07/08/24 at 1136 and received approval for admission today.  Admission Coordinator: Heron Leavell RN MSN, time [id number] Date 07/08/24

## 2024-07-02 NOTE — Progress Notes (Addendum)
 37 STROKE TEAM PROGRESS NOTE   BRIEF HPI [name] is a 89 y.o. female with history of COPD, PAD, HLD, OSA, right breast carcinoma stage Ia s/p lumpectomy, radiation, and hormone therapy in remission admitted initially on 7/5 with RUQ abdominal pain, nausea, vomiting with initial workup revealing elevated LFTs and possible cholecystitis/E. coli bacteremia s/p 5 days of ceftriaxone .  Hospital course complicated by hallucinations, encephalopathy, some difficulty breathing treated with diuretics. Patient developed a sudden onset of left-sided weakness with a right gaze deviation and slurred speech.  SIGNIFICANT HOSPITAL EVENTS 7/5: Admitted for RUQ pain, nausea, vomiting E. Coli bacteremia treated with 5 days of ceftriaxone  7/9: 18:15 [name] witnessed patient with sudden onset slurred speech, left-sided weakness, and right gaze deviation for which a Code Stroke was activated - TNKase  administered (no contraindication per primary team and surgery) - CTH with right MCA M1 occlusion, taken to IR for thrombectomy with TICI 2B revacaularization - Remained intubated for airway protection and admitted to the ICU overnight   - Sedation paused this morning with some prolonged sedating effects  7/10:Extubated 1230 7/11: corTrak placed, started on ASA  INTERIM HISTORY/SUBJECTIVE [name] is at the bedside.  Patient is laying in bed in no apparent distress she complains of back pain we will give her some codeine  see if that helps her pain and we will check LFTs in the morning  CBC    Component Value Date/Time   WBC 10.2 07/01/2024 0502   RBC 3.38 (L) 07/01/2024 0502   HGB 10.4 (L) 07/01/2024 0502   HGB 13.2 05/05/2024 1343   HCT 31.8 (L) 07/01/2024 0502   PLT 290 07/01/2024 0502   PLT 150 05/05/2024 1343   MCV 94.1 07/01/2024 0502   MCH 30.8 07/01/2024 0502   MCHC 32.7 07/01/2024 0502   RDW 13.2 07/01/2024 0502   LYMPHSABS 0.8 06/26/2024 0530   MONOABS 0.9 06/26/2024 0530   EOSABS  0.2 06/26/2024 0530   BASOSABS 0.1 06/26/2024 0530   BMET    Component Value Date/Time   NA 133 (L) 07/01/2024 0502   K 4.3 07/01/2024 0502   CL 100 07/01/2024 0502   CO2 23 07/01/2024 0502   GLUCOSE 117 (H) 07/01/2024 0502   BUN 19 07/01/2024 0502   CREATININE 0.55 07/01/2024 0502   CREATININE 0.74 05/05/2024 1343   CALCIUM  9.1 07/01/2024 0502   GFRNONAA >60 07/01/2024 0502   GFRNONAA >60 05/05/2024 1343   Lab Results  Component Value Date   HGBA1C 5.1 06/26/2024    Lab Results  Component Value Date   CHOL 96 06/26/2024   HDL 35 (L) 06/26/2024   LDLCALC 46 06/26/2024   TRIG 74 06/26/2024   TRIG 74 06/26/2024   CHOLHDL 2.7 06/26/2024   Drugs of Abuse  No results found for: LABOPIA, COCAINSCRNUR, LABBENZ, AMPHETMU, THCU, LABBARB   Urinalysis    Component Value Date/Time   COLORURINE YELLOW 06/21/2024 1532   APPEARANCEUR CLEAR 06/21/2024 1532   LABSPEC 1.013 06/21/2024 1532   PHURINE 7.0 06/21/2024 1532   GLUCOSEU NEGATIVE 06/21/2024 1532   HGBUR MODERATE (A) 06/21/2024 1532   BILIRUBINUR NEGATIVE 06/21/2024 1532   KETONESUR NEGATIVE 06/21/2024 1532   PROTEINUR NEGATIVE 06/21/2024 1532   UROBILINOGEN 1.0 11/01/2007 1614   NITRITE POSITIVE (A) 06/21/2024 1532   LEUKOCYTESUR NEGATIVE 06/21/2024 1532   IMAGING past 24 hours No results found.  Vitals:   07/01/24 2001 07/02/24 0818 07/02/24 0900 07/02/24 1050  BP: (!) 119/43   (!) 119/43  Pulse:  61 (!) 59    Resp: 16 18    Temp: 99 F (37.2 C)     TempSrc:      SpO2: 97% 98% 99%   Weight:      Height:       PHYSICAL EXAM General:  Critically ill appearing, elderly, Caucasian female laying in bed CV: Sinus bradycardia with rate in the 50's Respiratory: Room air GI: Abdomen soft and nontender  NEURO:  Mental Status: awake and alert.  She is oriented to self, age, month situation and place. She can follow commands.   Cranial Nerves:  II: PERRL.  left hemianopsia, no blink to threat  III,  IV, VI: Right gaze preference, will track towards midline but will not cross midline towards the left V: Does not respond to light touch on either side of the face VII: left facial droop  VIII: Hearing is intact to voice. IX, X: cough present  XI: Head is grossly midline XII: midline Motor: Left upper extremity is flaccid with withdrawal of the left lower extremity. Right arm and right leg spontaneous movement Sensation: decreased on left  Coordination: unable to assess Gait: Deferred  ASSESSMENT/PLAN Stroke:  right MCA territory infarct with R M1 occlusion s/p TNK and IR with TICI 2B initially but persistent occlusion eventually, etiology:  most likely large vessel disease    Code Stroke CT head No CT evidence of acute intracranial abnormality. ASPECTS is 10. CTA head & neck Abrupt occlusion of the distal M1 segment of the right middle cerebral artery S/p IR with repeated  reocclusion secondary to underlying long segment atherosclerotic plaque and final score TICI2b Post IR CT small focal right perisylvian subarachnoid hemorrhage. MRI brain  Evolving right MCA territory infarct. Mild associated petechial blood products at the right frontoparietal region. No significant regional mass effect. 2. Small volume acute subarachnoid hemorrhage at the base of the right Sylvian fissure.   MRA Persistent and/or recurrent proximal right M2 occlusion, superior division.  2D Echo LVEF 60-65%, mild atrial dilation LDL 46 HgbA1c 5.1 VTE prophylaxis - heparin  subq aspirin  81 mg daily prior to admission, now on aspirin  81 mg daily given SAH Therapy recommendations:  SNF vs CIR Disposition:  Pending  ? Hx of Stroke/TIA Acute onset of vertigo and gait disturbance in June 2018 with residual truncal ataxia requiring use of walker for ambulation  Concern for possible brainstem/cerebellar stroke at that time  Recommended for MRI evaluation at that time but this was lost to follow up   Acute Acalculous  Cholecystitis E. Coli bacteremia Elevated LFTs, resolving Admitted for right upper quadrant pain and nausea vomiting. 7/5 blood cultures + for E. Coli S/p Flagyl  Continue Cefadoxil  AST/ALT admission 1658/958 -> ...-> 25/57->37/60 Check LFTs in the morning  Less likely endocarditis given the bacteria species  HTN Home meds:  None Stable Add PRN labetalol , hydralazine  for BP control  Blood pressure goal SBP <160 given SAH Long term BP goal: Normotension  Hyperlipidemia Home meds:  atorvastatin  10 mg LDL 46, goal < 70 Held inpatient prior to transfer to skilled nursing facility if family desires.  Greater than 50% time during this 35-minute visit was spent in counseling and coordination of care about a stroke and dysphagia discussion with patient and family and answering questions.  Elevated LFTs High intensity statin not indicated as patient is at goal at lower dose Consider resume statin once po access and LFT normalizes  Dysphagia Patient has post-stroke dysphagia SLP consult Did not pass swallow,  Currently n.p.o. S/p cortrak  Continue TF @ 45  Post-op Respiratory Insufficiency,improved COPD  Was concerning for fluid overload status post diuretics Extubated 7/10, tolerating well  Other Stroke Risk Factors Advanced age Family hx stroke (possible stroke versus MI in patient's father) Remote tobacco use history 56-Taylor-year history, stopped smoking 8 years ago CAD/PAD Obstructive sleep apnea, on CPAP at home though reported not using effectively PTA  Other Active Problems Anemia, Hbg 13.8 on admission > 10.6 Depression on Lexapro   Hospital day # 11   Kirsten Geralds DNP, ACNPC-AG  [facility]  I have personally obtained history,examined this patient, reviewed notes, independently viewed imaging studies, participated in medical decision making and plan of care.ROS completed by me personally and pertinent positives fully documented  I have made any additions  or clarifications directly to the above note. Agree with note above.  Patient continues to have significant dysphagia and left hemiplegia.  Continue speech therapy for followed by.  Will need assessment by inpatient rehab team to see if she is a candidate.  Long discussion with patient and [name] at the bedside.  Has a speech therapist.  Kirsten Popp, MD Medical Director [name] Pager: [phone] 07/02/2024 12:41 PM  To contact Stroke Continuity provider, please refer to [email or web address]. After hours, contact General Neurology

## 2024-07-02 NOTE — Progress Notes (Signed)
 Speech Language Pathology Treatment: Dysphagia;Cognitive-Linguistic  Patient Details Name: [name] MRN: [id number] DOB: Jan 15, 1932 Today's Date: 07/02/2024 Time: 9044-8973 SLP Time Calculation (min) (ACUTE ONLY): 31 min  Assessment / Plan / Recommendation Clinical Impression  Pt seen for dysphagia, dysarthria and cognition with daughter at bedside. Pt awake but little drowsy after medication per daughter. Pt's speech intelligibility is improving and she was 100% accurate responding in short spontaneous sentences and imitation. Reminded her to use strategies, especially at times of decreased alertness or at the end of the day and reviewed with her and dtr. Her gaze continues to her right side and she was able to bring eyes to left to see therapist when cued but unable to turn head. Reiterated for daughter to stand on her left side to increase attention/awareness of left side. Pt reported she had a stroke but unable to state current deficits from baseline and did not state difficulty moving left side when asked. Min prompts provided to problem solve use of call bell to receive help when needed.  Pt's oral cavity cleaned and given single ice chips with prolonged oral transit and suspect delayed swallow initiation with laryngeal palpation not appreciated for one trial. No throat clearing, coughing or wet vocal quality. Pt exhibited difficulty initiating a volitional swallow and was achieved 40% of the time. A cue to throat clear prior was intermittently effective to initiate swallow. Pt introduced to EMST device and able to accurately use set at 5 cm H20 however when resistance increased to 15 she could not achieve adequate exhalation. Resistance lowered to approximately 6 cm H20 which she was successful with but fatigues quickly sometimes after 3 trials, needing rest breaks. Gave written handout and instructions to daughter to practice using device during the day and she verbalized understanding.  Added to handout exercise for volitional swallows. ST will continue to treat and will need repeat MBS when appropriate. Per admission coordinator an inpatient rehab consult has been placed.    HPI HPI: [name] is an 89 yo female presenting to ED 7/5 for RUQ pain, nausea/vomiting. Found to have suspected cholecystitis vs passed choledocholithiasis and acute liver injury, hyponatremia, bacteriuria, and hematuria. Initially seen at [facility] ED 7/3 with n/v and elevated LFTs but left without being seen. Daughter noted sudden onset AMS and L sided weakness 7/9 with CTA showing abrupt occlusion of distal R MCA M1 s/p TNK. Taken to IR with unsuccessful revascularization (repeated reocclusion), post-procedure CTH showed small SAH. Remained intubated after the procedure, 7/9-7/10. PMH includes HLD, PAD, COPD with emphysema, OSA, R breast cancer, back pain, esophageal spasm      SLP Plan  Continue with current plan of care          Recommendations  Diet recommendations: Other(comment);NPO (ice chips after oral care) Medication Administration: Via alternative means Supervision: Staff to assist with self feeding Postural Changes and/or Swallow Maneuvers: Seated upright 90 degrees                  Oral care QID;Oral care prior to ice chip/H20   Frequent or constant Supervision/Assistance Dysphagia, oropharyngeal phase (R13.12);Dysarthria and anarthria (R47.1);Cognitive communication deficit (M58.158)     Continue with current plan of care     [name]  07/02/2024, 10:34 AM

## 2024-07-02 NOTE — TOC Progression Note (Signed)
 Transition of Care [facility]) - Progression Note    Patient Details  Name: [name] MRN: [id number] Date of Birth: 12/31/1931  [facility] CM/SW Contact  Andrez JULIANNA George, RN Phone Number: 07/02/2024, 1:02 PM  Clinical Narrative:     CIR assessing for potential IR admission. IP Care Management following.   Expected Discharge Plan: IP Rehab Facility Barriers to Discharge: Continued Medical Work up, English as a second language teacher, SNF Pending bed offer  Expected Discharge Plan and Services   Discharge Planning Services: CM Consult Post Acute Care Choice: Skilled Nursing Facility Living arrangements for the past 2 months: Single Family Home                           HH Arranged: RN, OT, PT, Nurse's Aide HH Agency: [facility] Date [facility] Agency Contacted: 06/25/24 Time HH Agency Contacted: 1007 Representative spoke with at [facility] Agency: Darleene   Social Determinants of Health (SDOH) Interventions SDOH Screenings   Food Insecurity: No Food Insecurity (06/22/2024)  Housing: Low Risk  (06/22/2024)  Transportation Needs: No Transportation Needs (06/22/2024)  Utilities: Not At Risk (06/22/2024)  Social Connections: Socially Isolated (06/22/2024)  Tobacco Use: Medium Risk (06/21/2024)    Readmission Risk Interventions     No data to display

## 2024-07-02 NOTE — Plan of Care (Signed)

## 2024-07-02 NOTE — Consult Note (Signed)
 Physical Medicine and Rehabilitation Consult Reason for Consult:Rehab Referring Physician: Dr. Rosemarie   HPI: [name] is a 89 y.o. female with past medical history of PAD, hyperlipidemia, OSA, COPD, right breast carcinoma s/p lumpectomy, hormone therapy and radiation, depression who is admitted after she developed nausea, vomiting and had elevated LFTs was treated for acalculous cholecystitis with antibiotics.  LFTs improving.  During hospitalization patient developed respiratory insufficiency that improved with diuretics.  On 7/9 patient developed left-sided weakness, right gaze deviation, altered mental status and slurred speech.  She was found to have right MCA territory infarct with right MCA M1 occlusion. She had TNK and taken to IR for thrombectomy with TICI 2B revascularization.  Patient was extubated 7/10.  Core track was placed on 7/11.  She is followed by speech therapy for dysphagia and communication deficits.  Patient evaluated by PT and OT and found to have functional deficits and intensive inpatient rehabilitation recommended.  Per chart review patient lives in a one-story level entry home with personal care attendant 5 days a week and family can assist. Uses RW at home.   Home: Home Living Family/patient expects to be discharged to:: Private residence Living Arrangements: Alone Available Help at Discharge: Family, Personal care attendant (5 days/week 5 hours/day 10-3) Type of Home: Other(Comment) (condo) Home Access: Level entry Home Layout: One level Bathroom Shower/Tub: Psychologist, counselling, Door Bathroom Toilet: Handicapped height Home Equipment: Agricultural consultant (2 wheels), Rollator (4 wheels), [employer] - single point, Information systems manager - built in, [employer] - tub/shower, Hand held shower head, Grab bars - toilet, Transport chair  Functional History: Prior Function Prior Level of Function : Independent/Modified Independent, Needs assist Mobility Comments: use of RW in the  home ADLs Comments: PCA prepared breakfaast/lunch, laundry, set-up for medication management, grocery shopping, supervises showers, drives to appts. Pt otherwise mod I in ADL Functional Status:  Mobility: Bed Mobility Overal bed mobility: Needs Assistance Bed Mobility: Supine to Sit, Sit to Supine, Rolling Rolling: Mod assist, Max assist, Used rails Supine to sit: Max assist, +2 for physical assistance Sit to supine: Max assist, +2 for physical assistance General bed mobility comments: patient requiring assistance with BLEs and trunk with use of bed pad for scooting towards EOB Transfers Overall transfer level: Needs assistance Equipment used: 2 person hand held assist Transfers: Sit to/from Stand Sit to Stand: Max assist, +2 physical assistance Bed to/from chair/wheelchair/BSC transfer type:: Step pivot Step pivot transfers: Contact guard assist General transfer comment: Stood x2 from EOB with max assist +2 for power up and L knee block. cues and assistance for upright posture. Ambulation/Gait Ambulation/Gait assistance: Contact guard assist Gait Distance (Feet): 100 Feet Assistive device: Rolling walker (2 wheels) Gait Pattern/deviations: Step-to pattern, Decreased stride length General Gait Details: short step length, 2 standing rest breaks Gait velocity: dec Gait velocity interpretation: <1.31 ft/sec, indicative of household ambulator    ADL: ADL Overall ADL's : Needs assistance/impaired Eating/Feeding: NPO Eating/Feeding Details (indicate cue type and reason): assistance with opening containers Grooming: Wash/dry face, Minimal assistance, Sitting Grooming Details (indicate cue type and reason): on EOB Upper Body Bathing: Maximal assistance, Bed level Lower Body Bathing: Total assistance, Bed level Upper Body Dressing : Maximal assistance, Bed level Upper Body Dressing Details (indicate cue type and reason): donned gown to cover back Lower Body Dressing: Total assistance,  Bed level Lower Body Dressing Details (indicate cue type and reason): patient able to doff socks with mod assist to donn s Toilet Transfer: Contact  guard assist, Ambulation, Rolling walker (2 wheels), BSC/3in1 Toilet Transfer Details (indicate cue type and reason): simulated Toileting- Clothing Manipulation and Hygiene: Total assistance, Bed level Toileting - Clothing Manipulation Details (indicate cue type and reason): to bring depends underwear around hips Functional mobility during ADLs: Contact guard assist, Rolling walker (2 wheels) General ADL Comments: focused on sitting balance  Cognition: Cognition Overall Cognitive Status: Impaired/Different from baseline Arousal/Alertness: Awake/alert Orientation Level: Oriented X4 Attention: Sustained Sustained Attention: Impaired Sustained Attention Impairment: Verbal basic, Functional basic Memory: Impaired Memory Impairment: Decreased recall of new information Awareness: Impaired Awareness Impairment: Intellectual impairment Problem Solving: Impaired Problem Solving Impairment: Functional basic Cognition Arousal: Alert Behavior During Therapy: Flat affect Overall Cognitive Status: Impaired/Different from baseline   Review of Systems  Constitutional: Negative.   HENT:  Negative for congestion.   Respiratory:  Negative for shortness of breath.   Cardiovascular:  Negative for chest pain.  Gastrointestinal:  Negative for abdominal pain.  Genitourinary:  Negative for dysuria.  Musculoskeletal:  Positive for back pain and joint pain.  Skin:  Negative for rash.  Neurological:  Positive for sensory change, weakness and headaches.   Past Medical History:  Diagnosis Date   Anxiety    Back pain    Cancer (HCC)    Depression    Emphysema    Esophageal spasm    Lung nodule    OSA (obstructive sleep apnea)    Osteomalacia    Personal history of radiation therapy    Vertigo    Vitamin D deficiency    Past Surgical History:   Procedure Laterality Date   ABDOMINAL HYSTERECTOMY     APPENDECTOMY     BREAST BIOPSY Right 08/31/2020   BREAST EXCISIONAL BIOPSY Left 1978   BREAST LUMPECTOMY Right 09/30/2020   BREAST LUMPECTOMY WITH RADIOACTIVE SEED LOCALIZATION Right 09/30/2020   Procedure: RIGHT BREAST LUMPECTOMY WITH RADIOACTIVE SEED LOCALIZATION;  Surgeon: Curvin Deward MOULD, MD;  Location: [facility];  Service: General;  Laterality: Right;   CARPAL TUNNEL RELEASE     IR CT HEAD LTD  06/25/2024   IR PERCUTANEOUS ART THROMBECTOMY/INFUSION INTRACRANIAL INC DIAG ANGIO  06/25/2024   RADIOLOGY WITH ANESTHESIA N/A 06/25/2024   Procedure: RADIOLOGY WITH ANESTHESIA;  Surgeon: Dolphus Carrion, MD;  Location: [facility] OR;  Service: Radiology;  Laterality: N/A;   ROTATOR CUFF REPAIR     Family History  Problem Relation Age of Onset   Diabetes type II Other    Hypertension Other    Breast cancer Maternal Aunt        dx early 96s   Other Mother        died of natural causes at age 63   Other Father        stroke or heart attack while in shower   Diabetes type II Sister    Cancer Paternal Uncle        unknown type; dx early 34s   Social History:  reports that she quit smoking about 8 years ago. Her smoking use included cigarettes. She started smoking about 64 years ago. She has a 56 pack-year smoking history. She has never used smokeless tobacco. She reports that she does not drink alcohol  and does not use drugs. Allergies:  Allergies  Allergen Reactions   Adhesive [Tape] Itching and Other (See Comments)    Little red itchy bumps develop   Ativan  [Lorazepam ] Anxiety and Other (See Comments)    Family states makes her go crazy   Latex Itching and  Other (See Comments)    Little red itchy bumps develop   Medications Prior to Admission  Medication Sig Dispense Refill   acetaminophen  (TYLENOL ) 650 MG CR tablet Take 650 mg by mouth 3 (three) times daily as needed for pain.     albuterol  (PROVENTIL  HFA;VENTOLIN  HFA)  108 (90 Base) MCG/ACT inhaler Inhale 2 puffs into the lungs every 6 (six) hours as needed for wheezing or shortness of breath.     aspirin  EC 81 MG tablet Take 81 mg by mouth daily.     atorvastatin  (LIPITOR) 10 MG tablet Take 10 mg by mouth at bedtime.     Cholecalciferol (VITAMIN D) 2000 units tablet Take 4,000 Units by mouth daily.      escitalopram  (LEXAPRO ) 20 MG tablet Take 40 mg by mouth daily.  2   Multiple Vitamin (MULTIVITAMIN WITH MINERALS) TABS tablet Take 1 tablet by mouth daily.     pantoprazole  (PROTONIX ) 40 MG tablet Take 40 mg by mouth daily.     Polyethyl Glycol-Propyl Glycol (SYSTANE) 0.4-0.3 % SOLN Place 1 drop into both eyes daily as needed (dry eyes).     tiotropium (SPIRIVA) 18 MCG inhalation capsule Place 18 mcg into inhaler and inhale daily.     WELLBUTRIN  XL 150 MG 24 hr tablet 1 tablet in the morning Orally Once a day       Blood pressure (!) 119/43, pulse (!) 59, temperature 99 F (37.2 C), resp. rate 18, height 5' 1 (1.549 m), weight 71.7 kg, SpO2 99%. Physical Exam  General: No apparent distress, appears fatigued and uncomfortable HEENT: Head is normocephalic, atraumatic, sclera anicteric, oral mucosa dry, [place] in pace,  dentition decreased Neck: Supple without JVD or lymphadenopathy Heart: Bradycardic Chest: CTA bilaterally without wheezes, rales, or rhonchi; no distress Abdomen: Soft, non-tender, non-distended, bowel sounds positive. Extremities: No clubbing, cyanosis, or edema. Pulses are 2+ Psych: Pt's affect is flat Skin: Clean and intact without signs of breakdown Neuro:    Mental Status: Right gaze deviation/neglect, alert and oriented x3,  delayed responses, able to recall 1/3 words 5 minutes later Speech/Languate: Naming and repetition intact, fluent, follows simple commands, , speaks in 1-2 word answers, limited verbal output CRANIAL NERVES: II: Left homonymous hemianopsia III, IV, VI: EOM intact, no gaze preference or deviation V: reports  intact to b/l Face, but inconsistent responses VII: Left facial weakness VIII: hard of hearing IX, X: normal palatal elevation XI: head midline XII: Tongue midline   MOTOR: Required a lot of encouragement for participation RUE: Grossly moving spontaneously to gravity but provides minimal force during manual motor testing LUE: Flaccid RLE: Hip flexion 2-3 out of 5, distally 3-4 out of 5 LLE: withdraws to pain  Increased tone noted knee flexors  SENSORY: Decreased sensation LUE and LLE  Coordination: unable to assess    Results for orders placed or performed during the hospital encounter of 06/21/24 (from the past 24 hours)  Glucose, capillary     Status: Abnormal   Collection Time: 07/01/24 12:53 PM  Result Value Ref Range   Glucose-Capillary 132 (H) 70 - 99 mg/dL   Comment 1 Notify RN    Comment 2 Document in Chart   Glucose, capillary     Status: Abnormal   Collection Time: 07/01/24  8:01 PM  Result Value Ref Range   Glucose-Capillary 117 (H) 70 - 99 mg/dL  Glucose, capillary     Status: Abnormal   Collection Time: 07/02/24 12:39 AM  Result Value Ref Range  Glucose-Capillary 134 (H) 70 - 99 mg/dL  Glucose, capillary     Status: Abnormal   Collection Time: 07/02/24  2:57 AM  Result Value Ref Range   Glucose-Capillary 136 (H) 70 - 99 mg/dL  Glucose, capillary     Status: Abnormal   Collection Time: 07/02/24  8:17 AM  Result Value Ref Range   Glucose-Capillary 138 (H) 70 - 99 mg/dL   Comment 1 Notify RN    No results found.  Assessment/Plan: Diagnosis: Right MCA CVA likely due to large vessel disease Does the need for close, 24 hr/day medical supervision in concert with the patient's rehab needs make it unreasonable for this patient to be served in a less intensive setting? Yes Co-Morbidities requiring supervision/potential complications:  - Possible prior CVA, acute acalculous cholecystitis, E. coli bacteremia, elevated LFTs, hypertension, hyperlipidemia, postop  respiratory insufficiency, dysphagia, CAD/PAD, obstructive sleep apnea on CPAP, depression, low back pain Due to bladder management, bowel management, safety, skin/wound care, disease management, medication administration, pain management, and patient education, does the patient require 24 hr/day rehab nursing? Yes Does the patient require coordinated care of a physician, rehab nurse, therapy disciplines of Pt/OT/SLP to address physical and functional deficits in the context of the above medical diagnosis(es)? Yes Addressing deficits in the following areas: balance, endurance, locomotion, strength, transferring, bowel/bladder control, bathing, dressing, feeding, grooming, toileting, cognition, speech, language, swallowing, and psychosocial support Can the patient actively participate in an intensive therapy program of at least 3 hrs of therapy per day at least 5 days per week? Potentially The potential for patient to make measurable gains while on inpatient rehab is excellent Anticipated functional outcomes upon discharge from inpatient rehab are mod assist  with PT, mod assist with OT, min assist and mod assist with SLP. Estimated rehab length of stay to reach the above functional goals is: 16-20 Anticipated discharge destination: Home Overall Rehab/Functional Prognosis: good  POST ACUTE RECOMMENDATIONS: This patient's condition is appropriate for continued rehabilitative care in the following setting: CIR and SNF Patient has agreed to participate in recommended program. Potentially Note that insurance prior authorization may be required for reimbursement for recommended care.  Comment: I think patient would be a candidate for CIR versus SNF.  Not sure that she would be able to tolerate 3 hours a day at this time at this time but could potentially be an option for CIR if she shows progression/good participation with therapy.   MEDICAL RECOMMENDATIONS: Consider PRAFO/WHO for  LUE/LLE   I have  personally performed a face to face diagnostic evaluation of this patient. Additionally, I have examined the patient's medical record including any pertinent labs and radiographic images.    Thanks,  Murray Collier, MD 07/02/2024

## 2024-07-02 NOTE — Progress Notes (Signed)
 Physical Therapy Treatment Patient Details Name: [name] MRN: [id number] DOB: 1932-09-11 Today's Date: 07/02/2024   History of Present Illness Pt is an 89 y.o. female presenting 7/5 with RUQ abdominal pain radiating to back, nausea. Found to have suspected cholecystitis, hyponatremia, bacteriuria, hematuria. [facility] ED visit 7/3 Lab work demonstrates acute liver injury but pt leaving without being seen. 7/09 R MCA occlusion; received TNK; to IR with unsuccessful revascularization (repeated reocclusion); remained intubated until 7/10;  PMH: COPD, PAD, HLD, OSA, breast cancer.    PT Comments  Pt received in supine and agreeable to session. Pt requires grossly max A +2 for bed mobility and transfers. Pt limited by L inattention, weakness, and impaired balance. Pt requires increased assist for sitting balance this session with pt unable to correct despite cues. Pt able to attempt standing x2, but is limited by nausea, so pt returned to supine. Pt continues to benefit from PT services to progress toward functional mobility goals.     If plan is discharge home, recommend the following: Assist for transportation;Supervision due to cognitive status;Two people to help with walking and/or transfers;Assistance with cooking/housework;Assistance with feeding;Direct supervision/assist for medications management;Direct supervision/assist for financial management;Help with stairs or ramp for entrance   Can travel by private vehicle        Equipment Recommendations  Wheelchair (measurements PT);Wheelchair cushion (measurements PT);Hoyer lift    Recommendations for Other Services       Precautions / Restrictions Precautions Precautions: Fall Precaution/Restrictions Comments: [employer] Restrictions [employer] Bearing Restrictions Per Provider Order: No     Mobility  Bed Mobility Overal bed mobility: Needs Assistance Bed Mobility: Supine to Sit, Sit to Supine, Rolling Rolling: Mod assist, Max assist, Used  rails   Supine to sit: Max assist, +2 for physical assistance Sit to supine: Max assist, +2 for physical assistance   General bed mobility comments: assist for trunk and BLE management. improved rolling to the L with use of RUE on rail    Transfers Overall transfer level: Needs assistance Equipment used: 2 person hand held assist Transfers: Sit to/from Stand Sit to Stand: Max assist, +2 physical assistance           General transfer comment: From EOB x2 with max A +2 and bedpad. Pt demonstrates difficulty reaching an upright posture. L knee block    Ambulation/Gait                   Stairs             Wheelchair Mobility     Tilt Bed    Modified Rankin (Stroke Patients Only) Modified Rankin (Stroke Patients Only) Pre-Morbid Rankin Score: Moderate disability Modified Rankin: Severe disability     Balance Overall balance assessment: Needs assistance Sitting-balance support: Feet supported Sitting balance-Leahy Scale: Poor Sitting balance - Comments: max A sitting EOB with strong posterior lean despite cues to correct   Standing balance support: Bilateral upper extremity supported, During functional activity Standing balance-Leahy Scale: Zero Standing balance comment: reliant on therapist for support with knees blocked                            Communication Communication Communication: Impaired Factors Affecting Communication: Reduced clarity of speech  Cognition Arousal: Alert Behavior During Therapy: Flat affect   PT - Cognitive impairments: Attention, Problem solving, Safety/Judgement  Following commands: Impaired Following commands impaired: Follows one step commands with increased time    Cueing Cueing Techniques: Verbal cues, Tactile cues  Exercises      General Comments        Pertinent Vitals/Pain Pain Assessment Pain Assessment: Faces Faces Pain Scale: Hurts little more Pain  Location: back, BLE Pain Descriptors / Indicators: Aching, Grimacing, Discomfort Pain Intervention(s): Monitored during session     PT Goals (current goals can now be found in the care plan section) Acute Rehab PT Goals Patient Stated Goal: rest PT Goal Formulation: With patient/family Time For Goal Achievement: 07/10/24 Progress towards PT goals: Progressing toward goals    Frequency    Min 2X/week       AM-PAC PT 6 Clicks Mobility   Outcome Measure  Help needed turning from your back to your side while in a flat bed without using bedrails?: A Lot Help needed moving from lying on your back to sitting on the side of a flat bed without using bedrails?: Total Help needed moving to and from a bed to a chair (including a wheelchair)?: Total Help needed standing up from a chair using your arms (e.g., wheelchair or bedside chair)?: Total Help needed to walk in hospital room?: Total Help needed climbing 3-5 steps with a railing? : Total 6 Click Score: 7    End of Session Equipment Utilized During Treatment: Gait belt Activity Tolerance: Patient limited by fatigue;Other (comment);Patient tolerated treatment well (nausea) Patient left: with call bell/phone within reach;with family/visitor present;in bed;with bed alarm set Nurse Communication: Mobility status PT Visit Diagnosis: Unsteadiness on feet (R26.81);Hemiplegia and hemiparesis Hemiplegia - Right/Left: Left Hemiplegia - dominant/non-dominant: Non-dominant Hemiplegia - caused by: Cerebral infarction     Time: 8594-8574 PT Time Calculation (min) (ACUTE ONLY): 20 min  Charges:    $Therapeutic Activity: 8-22 mins PT General Charges $$ ACUTE PT VISIT: 1 Visit                    Darryle George, PTA Acute Rehabilitation Services Secure Chat Preferred  [phone]    Darryle George 07/02/2024, 3:41 PM

## 2024-07-03 DIAGNOSIS — I69391 Dysphagia following cerebral infarction: Secondary | ICD-10-CM | POA: Diagnosis not present

## 2024-07-03 DIAGNOSIS — I63511 Cerebral infarction due to unspecified occlusion or stenosis of right middle cerebral artery: Secondary | ICD-10-CM | POA: Diagnosis not present

## 2024-07-03 DIAGNOSIS — I609 Nontraumatic subarachnoid hemorrhage, unspecified: Secondary | ICD-10-CM | POA: Diagnosis not present

## 2024-07-03 DIAGNOSIS — I779 Disorder of arteries and arterioles, unspecified: Secondary | ICD-10-CM | POA: Diagnosis not present

## 2024-07-03 LAB — CBC
HCT: 30.2 % — ABNORMAL LOW (ref 36.0–46.0)
Hemoglobin: 9.9 g/dL — ABNORMAL LOW (ref 12.0–15.0)
MCH: 30.3 pg (ref 26.0–34.0)
MCHC: 32.8 g/dL (ref 30.0–36.0)
MCV: 92.4 fL (ref 80.0–100.0)
Platelets: 274 K/uL (ref 150–400)
RBC: 3.27 MIL/uL — ABNORMAL LOW (ref 3.87–5.11)
RDW: 13.5 % (ref 11.5–15.5)
WBC: 8.4 K/uL (ref 4.0–10.5)
nRBC: 0 % (ref 0.0–0.2)

## 2024-07-03 LAB — HEPATIC FUNCTION PANEL
ALT: 39 U/L (ref 0–44)
AST: 25 U/L (ref 15–41)
Albumin: 2.5 g/dL — ABNORMAL LOW (ref 3.5–5.0)
Alkaline Phosphatase: 61 U/L (ref 38–126)
Bilirubin, Direct: <0.1 mg/dL (ref 0.0–0.2)
Total Bilirubin: 0.6 mg/dL (ref 0.0–1.2)
Total Protein: 5.4 g/dL — ABNORMAL LOW (ref 6.5–8.1)

## 2024-07-03 LAB — GLUCOSE, CAPILLARY
Glucose-Capillary: 125 mg/dL — ABNORMAL HIGH (ref 70–99)
Glucose-Capillary: 118 mg/dL — ABNORMAL HIGH (ref 70–99)
Glucose-Capillary: 131 mg/dL — ABNORMAL HIGH (ref 70–99)
Glucose-Capillary: 122 mg/dL — ABNORMAL HIGH (ref 70–99)
Glucose-Capillary: 124 mg/dL — ABNORMAL HIGH (ref 70–99)

## 2024-07-03 LAB — BASIC METABOLIC PANEL WITH GFR
Anion gap: 9 (ref 5–15)
BUN: 25 mg/dL — ABNORMAL HIGH (ref 8–23)
CO2: 25 mmol/L (ref 22–32)
Calcium: 9.2 mg/dL (ref 8.9–10.3)
Chloride: 100 mmol/L (ref 98–111)
Creatinine, Ser: 0.68 mg/dL (ref 0.44–1.00)
GFR, Estimated: >60 mL/min (ref >60)
Glucose, Bld: 122 mg/dL — ABNORMAL HIGH (ref 70–99)
Potassium: 4.2 mmol/L (ref 3.5–5.1)
Sodium: 134 mmol/L — ABNORMAL LOW (ref 135–145)

## 2024-07-03 LAB — MAGNESIUM: Magnesium: 1.9 mg/dL (ref 1.7–2.4)

## 2024-07-03 LAB — PHOSPHORUS: Phosphorus: 4.5 mg/dL (ref 2.5–4.6)

## 2024-07-03 MED ORDER — OSMOLITE 1.5 CAL PO LIQD
1000.0000 mL | ORAL | Status: DC
  Administered 2024-07-03 – 2024-07-07 (×4): 1000 mL

## 2024-07-03 NOTE — Plan of Care (Signed)
  Problem: Education: Goal: Knowledge of General Education information will improve Description: Including pain rating scale, medication(s)/side effects and non-pharmacologic comfort measures Outcome: Progressing   Problem: Health Behavior/Discharge Planning: Goal: Ability to manage health-related needs will improve Outcome: Progressing   Problem: Clinical Measurements: Goal: Ability to maintain clinical measurements within normal limits will improve Outcome: Progressing Goal: Will remain free from infection Outcome: Progressing Goal: Diagnostic test results will improve Outcome: Progressing Goal: Respiratory complications will improve Outcome: Progressing Goal: Cardiovascular complication will be avoided Outcome: Progressing   Problem: Activity: Goal: Risk for activity intolerance will decrease Outcome: Progressing   Problem: Nutrition: Goal: Adequate nutrition will be maintained Outcome: Progressing   Problem: Coping: Goal: Level of anxiety will decrease Outcome: Progressing   Problem: Elimination: Goal: Will not experience complications related to bowel motility Outcome: Progressing Goal: Will not experience complications related to urinary retention Outcome: Progressing   Problem: Pain Managment: Goal: General experience of comfort will improve and/or be controlled Outcome: Progressing   Problem: Safety: Goal: Ability to remain free from injury will improve Outcome: Progressing   Problem: Skin Integrity: Goal: Risk for impaired skin integrity will decrease Outcome: Progressing   Problem: Education: Goal: Knowledge of disease or condition will improve Outcome: Progressing Goal: Knowledge of secondary prevention will improve (MUST DOCUMENT ALL) Outcome: Progressing Goal: Knowledge of patient specific risk factors will improve (DELETE if not current risk factor) Outcome: Progressing   Problem: Ischemic Stroke/TIA Tissue Perfusion: Goal: Complications of  ischemic stroke/TIA will be minimized Outcome: Progressing   Problem: Coping: Goal: Will verbalize positive feelings about self Outcome: Progressing Goal: Will identify appropriate support needs Outcome: Progressing   Problem: Health Behavior/Discharge Planning: Goal: Ability to manage health-related needs will improve Outcome: Progressing Goal: Goals will be collaboratively established with patient/family Outcome: Progressing   Problem: Self-Care: Goal: Ability to participate in self-care as condition permits will improve Outcome: Progressing Goal: Verbalization of feelings and concerns over difficulty with self-care will improve Outcome: Progressing Goal: Ability to communicate needs accurately will improve Outcome: Progressing   Problem: Nutrition: Goal: Risk of aspiration will decrease Outcome: Progressing Goal: Dietary intake will improve Outcome: Progressing   Problem: Activity: Goal: Ability to tolerate increased activity will improve Outcome: Progressing   Problem: Respiratory: Goal: Ability to maintain a clear airway and adequate ventilation will improve Outcome: Progressing   Problem: Role Relationship: Goal: Method of communication will improve Outcome: Progressing   Problem: Education: Goal: Understanding of CV disease, CV risk reduction, and recovery process will improve Outcome: Progressing Goal: Individualized Educational Video(s) Outcome: Progressing   Problem: Activity: Goal: Ability to return to baseline activity level will improve Outcome: Progressing   Problem: Cardiovascular: Goal: Ability to achieve and maintain adequate cardiovascular perfusion will improve Outcome: Progressing Goal: Vascular access site(s) Level 0-1 will be maintained Outcome: Progressing   Problem: Health Behavior/Discharge Planning: Goal: Ability to safely manage health-related needs after discharge will improve Outcome: Progressing

## 2024-07-03 NOTE — Plan of Care (Signed)
  Problem: Clinical Measurements: Goal: Ability to maintain clinical measurements within normal limits will improve Outcome: Progressing Goal: Will remain free from infection Outcome: Progressing Goal: Diagnostic test results will improve Outcome: Progressing Goal: Respiratory complications will improve Outcome: Progressing Goal: Cardiovascular complication will be avoided Outcome: Progressing   Problem: Elimination: Goal: Will not experience complications related to bowel motility Outcome: Progressing Goal: Will not experience complications related to urinary retention Outcome: Progressing   Problem: Safety: Goal: Ability to remain free from injury will improve Outcome: Progressing   Problem: Pain Managment: Goal: General experience of comfort will improve and/or be controlled Outcome: Progressing   Problem: Skin Integrity: Goal: Risk for impaired skin integrity will decrease Outcome: Progressing

## 2024-07-03 NOTE — Progress Notes (Addendum)
 37 STROKE TEAM PROGRESS NOTE   BRIEF HPI [name] is a 89 y.o. female with history of COPD, PAD, HLD, OSA, right breast carcinoma stage Ia s/p lumpectomy, radiation, and hormone therapy in remission admitted initially on 7/5 with RUQ abdominal pain, nausea, vomiting with initial workup revealing elevated LFTs and possible cholecystitis/E. coli bacteremia s/p 5 days of ceftriaxone .  Hospital course complicated by hallucinations, encephalopathy, some difficulty breathing treated with diuretics. Patient developed a sudden onset of left-sided weakness with a right gaze deviation and slurred speech.  SIGNIFICANT HOSPITAL EVENTS 7/5: Admitted for RUQ pain, nausea, vomiting E. Coli bacteremia treated with 5 days of ceftriaxone  7/9: 18:15 daughter witnessed patient with sudden onset slurred speech, left-sided weakness, and right gaze deviation for which a Code Stroke was activated - TNKase  administered (no contraindication per primary team and surgery) - CTH with right MCA M1 occlusion, taken to IR for thrombectomy with TICI 2B revacaularization - Remained intubated for airway protection and admitted to the ICU overnight   - Sedation paused this morning with some prolonged sedating effects  7/10:Extubated 1230 7/11: corTrak placed, started on ASA  INTERIM HISTORY/SUBJECTIVE Daughter is at the bedside.  Patient laying in the bed in no apparent distress. No new neurological events overnight Vitals and labs are stable CIR is assessing for potential admission  CBC    Component Value Date/Time   WBC 8.4 07/03/2024 0404   RBC 3.27 (L) 07/03/2024 0404   HGB 9.9 (L) 07/03/2024 0404   HGB 13.2 05/05/2024 1343   HCT 30.2 (L) 07/03/2024 0404   PLT 274 07/03/2024 0404   PLT 150 05/05/2024 1343   MCV 92.4 07/03/2024 0404   MCH 30.3 07/03/2024 0404   MCHC 32.8 07/03/2024 0404   RDW 13.5 07/03/2024 0404   LYMPHSABS 0.8 06/26/2024 0530   MONOABS 0.9 06/26/2024 0530   EOSABS 0.2 06/26/2024  0530   BASOSABS 0.1 06/26/2024 0530   BMET    Component Value Date/Time   NA 134 (L) 07/03/2024 0404   K 4.2 07/03/2024 0404   CL 100 07/03/2024 0404   CO2 25 07/03/2024 0404   GLUCOSE 122 (H) 07/03/2024 0404   BUN 25 (H) 07/03/2024 0404   CREATININE 0.68 07/03/2024 0404   CREATININE 0.74 05/05/2024 1343   CALCIUM  9.2 07/03/2024 0404   GFRNONAA >60 07/03/2024 0404   GFRNONAA >60 05/05/2024 1343   Lab Results  Component Value Date   HGBA1C 5.1 06/26/2024    Lab Results  Component Value Date   CHOL 96 06/26/2024   HDL 35 (L) 06/26/2024   LDLCALC 46 06/26/2024   TRIG 74 06/26/2024   TRIG 74 06/26/2024   CHOLHDL 2.7 06/26/2024   Drugs of Abuse  No results found for: LABOPIA, COCAINSCRNUR, LABBENZ, AMPHETMU, THCU, LABBARB   Urinalysis    Component Value Date/Time   COLORURINE YELLOW 06/21/2024 1532   APPEARANCEUR CLEAR 06/21/2024 1532   LABSPEC 1.013 06/21/2024 1532   PHURINE 7.0 06/21/2024 1532   GLUCOSEU NEGATIVE 06/21/2024 1532   HGBUR MODERATE (A) 06/21/2024 1532   BILIRUBINUR NEGATIVE 06/21/2024 1532   KETONESUR NEGATIVE 06/21/2024 1532   PROTEINUR NEGATIVE 06/21/2024 1532   UROBILINOGEN 1.0 11/01/2007 1614   NITRITE POSITIVE (A) 06/21/2024 1532   LEUKOCYTESUR NEGATIVE 06/21/2024 1532   IMAGING past 24 hours No results found.  Vitals:   07/03/24 0747 07/03/24 0903 07/03/24 1051 07/03/24 1107  BP: (!) 134/36  (!) 131/41 (!) 131/41  Pulse: 65   (!) 58  Resp:  16   15  Temp: 98.4 F (36.9 C)   97.9 F (36.6 C)  TempSrc: Oral   Oral  SpO2: 97% 98%  94%  Weight:      Height:       PHYSICAL EXAM General:  Critically ill appearing, elderly, Caucasian female laying in bed CV: Sinus bradycardia with rate in the 50's Respiratory: Room air GI: Abdomen soft and nontender  NEURO:  Mental Status: awake and alert.  She is oriented to self, age, month situation and place. She can follow commands.   Cranial Nerves:  II: PERRL.  left hemianopsia,  no blink to threat  III, IV, VI: Right gaze preference, will track towards midline but will not cross midline towards the left V: Does not respond to light touch on either side of the face VII: left facial droop  VIII: Hearing is intact to voice. IX, X: cough present  XI: Head is grossly midline XII: midline Motor: Left upper extremity is flaccid with withdrawal of the left lower extremity. Right arm and right leg spontaneous movement Sensation: decreased on left  Coordination: unable to assess Gait: Deferred  ASSESSMENT/PLAN Stroke:  right MCA territory infarct with R M1 occlusion s/p TNK and IR with TICI 2B initially but persistent occlusion eventually, etiology:  most likely large vessel disease    Code Stroke CT head No CT evidence of acute intracranial abnormality. ASPECTS is 10. CTA head & neck Abrupt occlusion of the distal M1 segment of the right middle cerebral artery S/p IR with repeated  reocclusion secondary to underlying long segment atherosclerotic plaque and final score TICI2b Post IR CT small focal right perisylvian subarachnoid hemorrhage. MRI brain  Evolving right MCA territory infarct. Mild associated petechial blood products at the right frontoparietal region. No significant regional mass effect. 2. Small volume acute subarachnoid hemorrhage at the base of the right Sylvian fissure.   MRA Persistent and/or recurrent proximal right M2 occlusion, superior division.  2D Echo LVEF 60-65%, mild atrial dilation LDL 46 HgbA1c 5.1 VTE prophylaxis - heparin  subq aspirin  81 mg daily prior to admission, now on aspirin  81 mg daily given SAH Therapy recommendations:  SNF vs CIR Disposition:  Pending  ? Hx of Stroke/TIA Acute onset of vertigo and gait disturbance in June 2018 with residual truncal ataxia requiring use of walker for ambulation  Concern for possible brainstem/cerebellar stroke at that time  Recommended for MRI evaluation at that time but this was lost to follow up    Acute Acalculous Cholecystitis E. Coli bacteremia Elevated LFTs, resolving Admitted for right upper quadrant pain and nausea vomiting. 7/5 blood cultures + for E. Coli S/p Flagyl  Continue Cefadoxil  AST/ALT admission 1658/958 -> ...-> 25/57->37/60-25/39 Check LFTs in the morning  Less likely endocarditis given the bacteria species  HTN Home meds:  None Stable Add PRN labetalol , hydralazine  for BP control  Blood pressure goal SBP <160 given SAH Long term BP goal: Normotension  Hyperlipidemia Home meds:  atorvastatin  10 mg LDL 46, goal < 70 Held inpatient prior to transfer to skilled nursing facility if family desires.  Greater than 50% time during this 35-minute visit was spent in counseling and coordination of care about a stroke and dysphagia discussion with patient and family and answering questions.  Elevated LFTs High intensity statin not indicated as patient is at goal at lower dose Consider resume statin once po access and LFT normalizes  Dysphagia Patient has post-stroke dysphagia SLP consult Did not pass swallow, Currently n.p.o. S/p cortrak  Continue TF @ 45  Post-op Respiratory Insufficiency,improved COPD  Was concerning for fluid overload status post diuretics Extubated 7/10, tolerating well  Other Stroke Risk Factors Advanced age Family hx stroke (possible stroke versus MI in patient's father) Remote tobacco use history 56-pack-year history, stopped smoking 8 years ago CAD/PAD Obstructive sleep apnea, on CPAP at home though reported not using effectively PTA  Other Active Problems Anemia, Hbg 13.8 on admission > 10.6 Depression on Lexapro   Hospital day # 12   Karna Geralds DNP, ACNPC-AG  [facility]  I have personally obtained history,examined this patient, reviewed notes, independently viewed imaging studies, participated in medical decision making and plan of care.ROS completed by me personally and pertinent positives fully documented   I have made any additions or clarifications directly to the above note. Agree with note above.  Patient medically stable to be transferred to inpatient rehab when bed available  Eather Popp, MD Medical Director [facility] Pager: [phone] 07/03/2024 3:41 PM   To contact Stroke Continuity provider, please refer to [email or web address]. After hours, contact General Neurology

## 2024-07-03 NOTE — Progress Notes (Signed)
 9PM Daughter requested not to disturb patient specially at night to have patient get enough rest and sleep. Patient on continuous feeding via Cortrak and an active order of CBG every 4 hours. Consulted on-call Dr. Vanessa, MD. If CBG check q4 can be hold for tonight and he approved of it.

## 2024-07-03 NOTE — Progress Notes (Signed)
 Occupational Therapy Treatment Patient Details Name: [name] MRN: [id number] DOB: [birth year] Today's Date: 07/03/2024   History of present illness Pt is an 89 y.o. female presenting 7/5 with RUQ abdominal pain radiating to back, nausea. Found to have suspected cholecystitis, hyponatremia, bacteriuria, hematuria. [facility] ED visit 7/3 Lab work demonstrates acute liver injury but pt leaving without being seen. 7/09 R MCA occlusion; received TNK; to IR with unsuccessful revascularization (repeated reocclusion); remained intubated until 7/10;  PMH: COPD, PAD, HLD, OSA, breast cancer.   OT comments  Patient received in supine and agreeable to OT treatment. [name] present during session and assisted with encouragement. Patient required max assist to get to EOB with assistance for trunk and BLEs Patient requiring max assist for sitting balance while performing reaching tasks and lateral leans. Grooming and UB bathing performed while seated on EOB with max assist. Patient performed one stand with face to face technique to move closer to [facility] with max assist and knees blocked. Patient assisted back to bed with max assist and positioned for comfort. Patient will benefit from intensive inpatient follow-up therapy, >3 hours/day. Acute OT to continue to follow to address established goals to facilitate DC to next venue of care.        If plan is discharge home, recommend the following:  Assist for transportation;Assistance with cooking/housework;Two people to help with walking and/or transfers;A lot of help with bathing/dressing/bathroom   Equipment Recommendations  Wheelchair cushion (measurements OT);Wheelchair (measurements OT);Hospital bed    Recommendations for Other Services      Precautions / Restrictions Precautions Precautions: Fall Precaution/Restrictions Comments: Cortrak Restrictions Weight Bearing Restrictions Per Provider Order: No       Mobility Bed Mobility Overal bed  mobility: Needs Assistance Bed Mobility: Supine to Sit, Sit to Supine, Rolling Rolling: Mod assist, Max assist, Used rails   Supine to sit: Max assist Sit to supine: Max assist   General bed mobility comments: assistance with trunk and BLEs with limited participation from patient due to weakness    Transfers Overall transfer level: Needs assistance Equipment used: 2 person hand held assist Transfers: Sit to/from Stand Sit to Stand: Max assist           General transfer comment: face to face technique to stand from EOB to allow for knees to be blocked     Balance Overall balance assessment: Needs assistance Sitting-balance support: Feet supported Sitting balance-Leahy Scale: Poor Sitting balance - Comments: max assist with sitting balance due to posterior and left lateral leaning Postural control: Posterior lean, Left lateral lean Standing balance support: Bilateral upper extremity supported, During functional activity Standing balance-Leahy Scale: Zero Standing balance comment: stood from EOB to move hips towards [facility]                           ADL either performed or assessed with clinical judgement   ADL Overall ADL's : Needs assistance/impaired     Grooming: Wash/dry face;Minimal assistance;Sitting Grooming Details (indicate cue type and reason): on EOB Upper Body Bathing: Total assistance;Sitting Upper Body Bathing Details (indicate cue type and reason): on EOB                           General ADL Comments: patient's daughter assisted with patient for UB bathing seated on EOB    Extremity/Trunk Assessment Upper Extremity Assessment Upper Extremity Assessment: Right hand dominant LUE Deficits / Details: No AROM  with increased tone noted throughout LUE Sensation: decreased light touch;decreased proprioception LUE Coordination: decreased fine motor;decreased gross motor            Vision       Perception     Praxis      Communication Communication Communication: Impaired Factors Affecting Communication: Reduced clarity of speech   Cognition Arousal: Alert Behavior During Therapy: Flat affect Cognition: No apparent impairments                               Following commands: Impaired Following commands impaired: Follows one step commands with increased time      Cueing   Cueing Techniques: Verbal cues, Tactile cues  Exercises Exercises: Other exercises Other Exercises Other Exercises: reaching tasks while seated on EOB to increase trunk strength and sitting balance.    Shoulder Instructions       General Comments VSS    Pertinent Vitals/ Pain       Pain Assessment Pain Assessment: Faces Faces Pain Scale: Hurts little more Pain Location: back, BLE Pain Descriptors / Indicators: Aching, Grimacing, Discomfort Pain Intervention(s): Limited activity within patient's tolerance, Monitored during session, Repositioned  Home Living                                          Prior Functioning/Environment              Frequency  Min 2X/week        Progress Toward Goals  OT Goals(current goals can now be found in the care plan section)  Progress towards OT goals: Progressing toward goals  Acute Rehab OT Goals Patient Stated Goal: go home OT Goal Formulation: With patient Time For Goal Achievement: 07/11/24 Potential to Achieve Goals: Good ADL Goals Pt Will Perform Grooming: with min assist;sitting Pt Will Perform Upper Body Bathing: with min assist;sitting Pt Will Perform Upper Body Dressing: with min assist;sitting Pt Will Perform Lower Body Dressing: with mod assist;sitting/lateral leans Pt Will Transfer to Toilet: with min assist;with +2 assist;stand pivot transfer;bedside commode  Plan      Co-evaluation                 AM-PAC OT 6 Clicks Daily Activity     Outcome Measure   Help from another person eating meals?: Total Help  from another person taking care of personal grooming?: A Lot Help from another person toileting, which includes using toliet, bedpan, or urinal?: A Lot Help from another person bathing (including washing, rinsing, drying)?: A Lot Help from another person to put on and taking off regular upper body clothing?: A Lot Help from another person to put on and taking off regular lower body clothing?: A Lot 6 Click Score: 11    End of Session Equipment Utilized During Treatment: Gait belt  OT Visit Diagnosis: Unsteadiness on feet (R26.81);Muscle weakness (generalized) (M62.81);Hemiplegia and hemiparesis Hemiplegia - Right/Left: Left Hemiplegia - dominant/non-dominant: Non-Dominant Hemiplegia - caused by: Cerebral infarction   Activity Tolerance Patient tolerated treatment well;Patient limited by fatigue   Patient Left in bed;with bed alarm set;with family/visitor present   Nurse Communication Mobility status        Time: 1253-1316 OT Time Calculation (min): 23 min  Charges: OT General Charges $OT Visit: 1 Visit OT Treatments $Self Care/Home Management : 8-22 mins $Therapeutic Activity: 8-22 mins  Dick Laine, OTA Acute Rehabilitation Services  Office [phone]   Jeb LITTIE Laine 07/03/2024, 3:14 PM

## 2024-07-03 NOTE — Progress Notes (Signed)
 Nutrition Follow-up  DOCUMENTATION CODES:  Non-severe (moderate) malnutrition in context of social or environmental circumstances  INTERVENTION:  Continue the following via cortrak:  Osmolite 1.5 at goal rate of 40 ml/hr (960 ml per day) Prosource TF20 60 ml daily Provides 1520 kcal, 80 gm protein, 732 ml free water daily MVI with minerals daily    NUTRITION DIAGNOSIS:  Moderate Malnutrition related to social / environmental circumstances as evidenced by moderate fat depletion, mild muscle depletion. - remains applicable  GOAL:  Patient will meet greater than or equal to 90% of their needs - progressing  MONITOR:  I & O's, Diet advancement  REASON FOR ASSESSMENT:  Ventilator    ASSESSMENT:  Pt with PMH of HLD, PAD, COPD with emphysema, OSA, R breast ca s/p lumpectomy/XRT/hormone therapy admitted 7/5 for RUQ pain, N/V, and poor PO intake. Dx with acalculous cholecystitis and E.coli bacteremia.   7/5 - admitted; clears 7/8 - diet advanced to fulls; MRCP poor study, HIDA negative fro cholecystitis 7/9 - tx to ICU and intubated; dx with R MCA stroke s/p TNK, IR for mechanical thrombectomy which reoccluded 7/10- extubated; awaiting SLP eval 7/11 - MBS, NPO recommended, cortrak placed 7/16 - SLP BSE, NPO  Pt resting in bed at the time of assessment. Awake and alert, able to nod and shake head. Family at bedside reports that pt has not complained of pain or fullness with feeds. Has had some nausea but medications were effective.   Did reduce TF infusion rate slightly - discussed with RN. Pt being considered for CIR.  Admit weight: 67.1 kg  Current weight: 71.7 kg    Nutritionally Relevant Medications: Scheduled Meds:  PROSource TF20  60 mL Per Tube Daily   multivitamin with minerals  1 tablet Per Tube Daily   Continuous Infusions:  feeding supplement (OSMOLITE 1.5 CAL) 1,000 mL (07/02/24 1744)   PRN Meds: ondansetron , polyethylene glycol  Labs Reviewed: Sodium  134 BUN 25 CBG ranges from 111-138 mg/dL over the last 24 hours HgbA1c 5.1% (7/10)   NUTRITION - FOCUSED PHYSICAL EXAM: Flowsheet Row Most Recent Value  Orbital Region Moderate depletion  Upper Arm Region Unable to assess  Thoracic and Lumbar Region Moderate depletion  Buccal Region Unable to assess  Temple Region Mild depletion  Clavicle Bone Region Mild depletion  Clavicle and Acromion Bone Region Mild depletion  Scapular Bone Region Unable to assess  Dorsal Hand Unable to assess  Patellar Region Mild depletion  Anterior Thigh Region Mild depletion  Posterior Calf Region Moderate depletion  Edema (RD Assessment) Mild  Hair Reviewed  Eyes Reviewed  Mouth Unable to assess  Skin Reviewed  Nails Unable to assess    Diet Order:   Diet Order             Diet NPO time specified Except for: Ice Chips  Diet effective now                   EDUCATION NEEDS:  Education needs have been addressed  Skin:  Skin Assessment: Reviewed RN Assessment  Last BM:  7/15 - type 6  Height:  Ht Readings from Last 1 Encounters:  06/21/24 5' 1 (1.549 m)    Weight:  Wt Readings from Last 1 Encounters:  06/29/24 71.7 kg    BMI:  Body mass index is 29.87 kg/m.  Estimated Nutritional Needs:  Kcal:  1400-1700 kcal/d Protein:  70-85 g/d Fluid:  >1.6 L/day    Vernell Lukes, RD, LDN, CNSC Registered Dietitian  II Please reach out via secure chat

## 2024-07-03 NOTE — Plan of Care (Signed)

## 2024-07-04 DIAGNOSIS — I63511 Cerebral infarction due to unspecified occlusion or stenosis of right middle cerebral artery: Secondary | ICD-10-CM | POA: Diagnosis not present

## 2024-07-04 DIAGNOSIS — I779 Disorder of arteries and arterioles, unspecified: Secondary | ICD-10-CM | POA: Diagnosis not present

## 2024-07-04 DIAGNOSIS — I69391 Dysphagia following cerebral infarction: Secondary | ICD-10-CM | POA: Diagnosis not present

## 2024-07-04 LAB — GLUCOSE, CAPILLARY
Glucose-Capillary: 125 mg/dL — ABNORMAL HIGH (ref 70–99)
Glucose-Capillary: 108 mg/dL — ABNORMAL HIGH (ref 70–99)
Glucose-Capillary: 115 mg/dL — ABNORMAL HIGH (ref 70–99)
Glucose-Capillary: 113 mg/dL — ABNORMAL HIGH (ref 70–99)
Glucose-Capillary: 130 mg/dL — ABNORMAL HIGH (ref 70–99)

## 2024-07-04 MED ORDER — LIDOCAINE 5 % EX OINT
TOPICAL_OINTMENT | Freq: Every day | CUTANEOUS | Status: DC | PRN
  Filled 2024-07-04: qty 35.44

## 2024-07-04 NOTE — Plan of Care (Signed)
  Problem: Education: Goal: Knowledge of General Education information will improve Description: Including pain rating scale, medication(s)/side effects and non-pharmacologic comfort measures Outcome: Progressing   Problem: Health Behavior/Discharge Planning: Goal: Ability to manage health-related needs will improve Outcome: Progressing   Problem: Clinical Measurements: Goal: Ability to maintain clinical measurements within normal limits will improve Outcome: Progressing Goal: Will remain free from infection Outcome: Progressing Goal: Diagnostic test results will improve Outcome: Progressing Goal: Respiratory complications will improve Outcome: Progressing Goal: Cardiovascular complication will be avoided Outcome: Progressing   Problem: Activity: Goal: Risk for activity intolerance will decrease Outcome: Progressing   Problem: Nutrition: Goal: Adequate nutrition will be maintained Outcome: Progressing   Problem: Coping: Goal: Level of anxiety will decrease Outcome: Progressing   Problem: Elimination: Goal: Will not experience complications related to bowel motility Outcome: Progressing Goal: Will not experience complications related to urinary retention Outcome: Progressing   Problem: Pain Managment: Goal: General experience of comfort will improve and/or be controlled Outcome: Progressing   Problem: Safety: Goal: Ability to remain free from injury will improve Outcome: Progressing   Problem: Skin Integrity: Goal: Risk for impaired skin integrity will decrease Outcome: Progressing   Problem: Education: Goal: Knowledge of disease or condition will improve Outcome: Progressing Goal: Knowledge of secondary prevention will improve (MUST DOCUMENT ALL) Outcome: Progressing Goal: Knowledge of patient specific risk factors will improve (DELETE if not current risk factor) Outcome: Progressing   Problem: Ischemic Stroke/TIA Tissue Perfusion: Goal: Complications of  ischemic stroke/TIA will be minimized Outcome: Progressing   Problem: Coping: Goal: Will verbalize positive feelings about self Outcome: Progressing Goal: Will identify appropriate support needs Outcome: Progressing   Problem: Health Behavior/Discharge Planning: Goal: Ability to manage health-related needs will improve Outcome: Progressing Goal: Goals will be collaboratively established with patient/family Outcome: Progressing   Problem: Self-Care: Goal: Ability to participate in self-care as condition permits will improve Outcome: Progressing Goal: Verbalization of feelings and concerns over difficulty with self-care will improve Outcome: Progressing Goal: Ability to communicate needs accurately will improve Outcome: Progressing   Problem: Nutrition: Goal: Risk of aspiration will decrease Outcome: Progressing Goal: Dietary intake will improve Outcome: Progressing   Problem: Activity: Goal: Ability to tolerate increased activity will improve Outcome: Progressing   Problem: Respiratory: Goal: Ability to maintain a clear airway and adequate ventilation will improve Outcome: Progressing   Problem: Role Relationship: Goal: Method of communication will improve Outcome: Progressing   Problem: Education: Goal: Understanding of CV disease, CV risk reduction, and recovery process will improve Outcome: Progressing Goal: Individualized Educational Video(s) Outcome: Progressing   Problem: Activity: Goal: Ability to return to baseline activity level will improve Outcome: Progressing   Problem: Cardiovascular: Goal: Ability to achieve and maintain adequate cardiovascular perfusion will improve Outcome: Progressing Goal: Vascular access site(s) Level 0-1 will be maintained Outcome: Progressing   Problem: Health Behavior/Discharge Planning: Goal: Ability to safely manage health-related needs after discharge will improve Outcome: Progressing

## 2024-07-04 NOTE — Progress Notes (Signed)
 37 STROKE TEAM PROGRESS NOTE   BRIEF HPI [name] is a 89 y.o. female with history of COPD, PAD, HLD, OSA, right breast carcinoma stage Ia s/p lumpectomy, radiation, and hormone therapy in remission admitted initially on 7/5 with RUQ abdominal pain, nausea, vomiting with initial workup revealing elevated LFTs and possible cholecystitis/E. coli bacteremia s/p 5 days of ceftriaxone .  Hospital course complicated by hallucinations, encephalopathy, some difficulty breathing treated with diuretics. Patient developed a sudden onset of left-sided weakness with a right gaze deviation and slurred speech.  SIGNIFICANT HOSPITAL EVENTS 7/5: Admitted for RUQ pain, nausea, vomiting E. Coli bacteremia treated with 5 days of ceftriaxone  7/9: 18:15 daughter witnessed patient with sudden onset slurred speech, left-sided weakness, and right gaze deviation for which a Code Stroke was activated - TNKase  administered (no contraindication per primary team and surgery) - CTH with right MCA M1 occlusion, taken to IR for thrombectomy with TICI 2B revacaularization - Remained intubated for airway protection and admitted to the ICU overnight   - Sedation paused this morning with some prolonged sedating effects  7/10:Extubated 1230 7/11: corTrak placed, started on ASA  INTERIM HISTORY/SUBJECTIVE Daughter is at the bedside.  Patient laying in the bed in no apparent distress. No new neurological events overnight Vitals and labs are stable Awaiting CIR decision for potential admission  CBC    Component Value Date/Time   WBC 8.4 07/03/2024 0404   RBC 3.27 (L) 07/03/2024 0404   HGB 9.9 (L) 07/03/2024 0404   HGB 13.2 05/05/2024 1343   HCT 30.2 (L) 07/03/2024 0404   PLT 274 07/03/2024 0404   PLT 150 05/05/2024 1343   MCV 92.4 07/03/2024 0404   MCH 30.3 07/03/2024 0404   MCHC 32.8 07/03/2024 0404   RDW 13.5 07/03/2024 0404   LYMPHSABS 0.8 06/26/2024 0530   MONOABS 0.9 06/26/2024 0530   EOSABS 0.2  06/26/2024 0530   BASOSABS 0.1 06/26/2024 0530   BMET    Component Value Date/Time   NA 134 (L) 07/03/2024 0404   K 4.2 07/03/2024 0404   CL 100 07/03/2024 0404   CO2 25 07/03/2024 0404   GLUCOSE 122 (H) 07/03/2024 0404   BUN 25 (H) 07/03/2024 0404   CREATININE 0.68 07/03/2024 0404   CREATININE 0.74 05/05/2024 1343   CALCIUM  9.2 07/03/2024 0404   GFRNONAA >60 07/03/2024 0404   GFRNONAA >60 05/05/2024 1343   Lab Results  Component Value Date   HGBA1C 5.1 06/26/2024    Lab Results  Component Value Date   CHOL 96 06/26/2024   HDL 35 (L) 06/26/2024   LDLCALC 46 06/26/2024   TRIG 74 06/26/2024   TRIG 74 06/26/2024   CHOLHDL 2.7 06/26/2024   Drugs of Abuse  No results found for: LABOPIA, COCAINSCRNUR, LABBENZ, AMPHETMU, THCU, LABBARB   Urinalysis    Component Value Date/Time   COLORURINE YELLOW 06/21/2024 1532   APPEARANCEUR CLEAR 06/21/2024 1532   LABSPEC 1.013 06/21/2024 1532   PHURINE 7.0 06/21/2024 1532   GLUCOSEU NEGATIVE 06/21/2024 1532   HGBUR MODERATE (A) 06/21/2024 1532   BILIRUBINUR NEGATIVE 06/21/2024 1532   KETONESUR NEGATIVE 06/21/2024 1532   PROTEINUR NEGATIVE 06/21/2024 1532   UROBILINOGEN 1.0 11/01/2007 1614   NITRITE POSITIVE (A) 06/21/2024 1532   LEUKOCYTESUR NEGATIVE 06/21/2024 1532   IMAGING past 24 hours No results found.  Vitals:   07/04/24 0650 07/04/24 0734 07/04/24 0919 07/04/24 1030  BP: (!) 128/52 117/62  117/62  Pulse:  60    Resp:  19  Temp:  98.3 F (36.8 C)    TempSrc:      SpO2:  97% 96%   Weight:      Height:       PHYSICAL EXAM General:  Critically ill appearing, elderly, Caucasian female laying in bed CV: Sinus bradycardia with rate in the 50's Respiratory: Room air GI: Abdomen soft and nontender  NEURO:  Mental Status: awake and alert.  She is oriented to self, age, month situation and place. She can follow commands.   Cranial Nerves:  II: PERRL.  left hemianopsia, no blink to threat  III, IV, VI:  Right gaze preference, will track towards midline but will not cross midline towards the left V: Does not respond to light touch on either side of the face VII: left facial droop  VIII: Hearing is intact to voice. IX, X: cough present  XI: Head is grossly midline XII: midline Motor: Left upper extremity is flaccid with withdrawal of the left lower extremity. Right arm and right leg spontaneous movement Sensation: decreased on left  Coordination: unable to assess Gait: Deferred  ASSESSMENT/PLAN Stroke:  right MCA territory infarct with R M1 occlusion s/p TNK and IR with TICI 2B initially but persistent occlusion eventually, etiology:  most likely large vessel disease    Code Stroke CT head No CT evidence of acute intracranial abnormality. ASPECTS is 10. CTA head & neck Abrupt occlusion of the distal M1 segment of the right middle cerebral artery S/p IR with repeated  reocclusion secondary to underlying long segment atherosclerotic plaque and final score TICI2b Post IR CT small focal right perisylvian subarachnoid hemorrhage. MRI brain  Evolving right MCA territory infarct. Mild associated petechial blood products at the right frontoparietal region. No significant regional mass effect. 2. Small volume acute subarachnoid hemorrhage at the base of the right Sylvian fissure.   MRA Persistent and/or recurrent proximal right M2 occlusion, superior division.  2D Echo LVEF 60-65%, mild atrial dilation LDL 46 HgbA1c 5.1 VTE prophylaxis - heparin  subq aspirin  81 mg daily prior to admission, now on aspirin  81 mg daily given SAH Therapy recommendations:  SNF vs CIR Disposition:  Pending  ? Hx of Stroke/TIA Acute onset of vertigo and gait disturbance in June 2018 with residual truncal ataxia requiring use of walker for ambulation  Concern for possible brainstem/cerebellar stroke at that time  Recommended for MRI evaluation at that time but this was lost to follow up   Acute Acalculous  Cholecystitis E. Coli bacteremia Elevated LFTs, resolving Admitted for right upper quadrant pain and nausea vomiting. 7/5 blood cultures + for E. Coli S/p Flagyl  Continue Cefadoxil  AST/ALT admission 1658/958 -> ...-> 25/57->37/60-25/39 Check LFTs in the morning  Less likely endocarditis given the bacteria species  HTN Home meds:  None Stable Add PRN labetalol , hydralazine  for BP control  Blood pressure goal SBP <160 given SAH Long term BP goal: Normotension  Hyperlipidemia Home meds:  atorvastatin  10 mg LDL 46, goal < 70 Held inpatient prior to transfer to skilled nursing facility if family desires.  Greater than 50% time during this 35-minute visit was spent in counseling and coordination of care about a stroke and dysphagia discussion with patient and family and answering questions.  Elevated LFTs High intensity statin not indicated as patient is at goal at lower dose Consider resume statin once po access and LFT normalizes  Dysphagia Patient has post-stroke dysphagia SLP consult Did not pass swallow, Currently n.p.o. S/p cortrak  Continue TF @ 45  Post-op Respiratory  Insufficiency,improved COPD  Was concerning for fluid overload status post diuretics Extubated 7/10, tolerating well  Other Stroke Risk Factors Advanced age Family hx stroke (possible stroke versus MI in patient's father) Remote tobacco use history 56-pack-year history, stopped smoking 8 years ago CAD/PAD Obstructive sleep apnea, on CPAP at home though reported not using effectively PTA  Other Active Problems Anemia, Hbg 13.8 on admission > 10.6 Depression on Lexapro   Hospital day # 13      Patient medically stable to be transferred to inpatient rehab when bed available  Eather Popp, MD Medical Director [facility] Pager: [phone] 07/04/2024 2:21 PM   To contact Stroke Continuity provider, please refer to [email or web address]. After hours, contact General Neurology

## 2024-07-04 NOTE — Progress Notes (Signed)
   Inpatient Rehabilitation Admissions Coordinator   I met with patient at bedside with her daughter, Randall. I reviewed goals, level of therapy at [facility] and caregiver supports needed after a CIR admit.  Patient's prior caregiver, Doyal, was providing 5 hr per day of support . They will increase her hours and family will provide the other caregiver supports needed in her home 24/7.I await further progress with therapy over the weekend to determine if patient will be CIR or SNF appropriate.  Heron Leavell, RN, MSN Rehab Admissions Coordinator [phone] 07/04/2024 10:25 AM

## 2024-07-04 NOTE — Progress Notes (Addendum)
 Speech Language Pathology Treatment: Dysphagia  Patient Details Name: [name] MRN: [id number] DOB: Nov 18, 1932 Today's Date: 07/04/2024 Time: 1125-1225 SLP Time Calculation (min) (ACUTE ONLY): 60 min  Assessment / Plan / Recommendation Clinical Impression  Pt seen at bedside for skilled ST intervention targeting goals for PO readiness, intelligibility, and cognition. Pt's daughter was present throughout session. Caregiver arrived during session. Pt was awake, but appeared drowsy. Good attention to task noted. Pt has limited endurance, and was noted to fatigue during PO trials. Pt accurate for name, DOB. Good problem solving observed as pt completed oral care with mod assist.  Oral care was completed with suction. Family was receptive to education regarding the importance of oral care prior to giving ice chips. Guidelines for giving ice chips posted at [facility] and reviewed with pt/family.  After oral care, pt accepted individual ice chips and several small boluses of ice cream. Weak oral manipulation with intermittent left anterior leakage was observed. No cough response elicited during initial presentations, however, with increasing fatigue, pt was noted to begin to cough after PO presentations. Recommend continuing with ice chips when pt is alert and upright, immediately following oral care with suction. Family was encouraged to work on effortful swallow and EMST this weekend as able. Hopeful to repeat MBS next week.    HPI HPI: [name] is an 89 yo female presenting to ED 7/5 for RUQ pain, nausea/vomiting. Found to have suspected cholecystitis vs passed choledocholithiasis and acute liver injury, hyponatremia, bacteriuria, and hematuria. Initially seen at [facility] ED 7/3 with n/v and elevated LFTs but left without being seen. Daughter noted sudden onset AMS and L sided weakness 7/9 with CTA showing abrupt occlusion of distal R MCA M1 s/p TNK. Taken to IR with unsuccessful revascularization  (repeated reocclusion), post-procedure CTH showed small SAH. Remained intubated after the procedure, 7/9-7/10. PMH includes HLD, PAD, COPD with emphysema, OSA, R breast cancer, back pain, esophageal spasm      SLP Plan  Continue with current plan of care          Recommendations  Diet recommendations: NPO Medication Administration: Via alternative means                  Oral care prior to ice chip/H20   Frequent or constant Supervision/Assistance Dysphagia, oropharyngeal phase (R13.12);Dysarthria and anarthria (R47.1);Cognitive communication deficit (R41.841)     Continue with current plan of care    Jla Reynolds B. Dory, MSP, CCC-SLP Speech Language Pathologist Office: [phone]  [name] 07/04/2024, 12:32 PM

## 2024-07-04 NOTE — Progress Notes (Signed)
 Physical Therapy Treatment Patient Details Name: [name] MRN: [id number] DOB: 1932-04-17 Today's Date: 07/04/2024   History of Present Illness Pt is an 89 y.o. female presenting 7/5 with RUQ abdominal pain radiating to back, nausea. Found to have suspected cholecystitis, hyponatremia, bacteriuria, hematuria. [facility] ED visit 7/3 Lab work demonstrates acute liver injury but pt leaving without being seen. 7/09 R MCA occlusion; received TNK; to IR with unsuccessful revascularization (repeated reocclusion); remained intubated until 7/10;  PMH: COPD, PAD, HLD, OSA, breast cancer.    PT Comments  Pt received in supine and agreeable to session. Pt requires max-max A +2 for bed mobility, sitting balance, and transfers due to weakness. Pt demonstrates posterior and lateral leans sitting EOB and is able to identify them, however is unable to correct without assist due to weakness. Pt requires knee block and max A +2 to stand from EOB, but demonstrates difficulty reaching a full upright posture. Pt continues to benefit from PT services to progress toward functional mobility goals.     If plan is discharge home, recommend the following: Assist for transportation;Supervision due to cognitive status;Two people to help with walking and/or transfers;Assistance with cooking/housework;Assistance with feeding;Direct supervision/assist for medications management;Direct supervision/assist for financial management;Help with stairs or ramp for entrance   Can travel by private vehicle     No  Equipment Recommendations  Wheelchair (measurements PT);Wheelchair cushion (measurements PT);Hoyer lift    Recommendations for Other Services       Precautions / Restrictions Precautions Precautions: Fall Precaution/Restrictions Comments: Cortrak Restrictions [employer] Bearing Restrictions Per Provider Order: No     Mobility  Bed Mobility Overal bed mobility: Needs Assistance Bed Mobility: Supine to Sit, Sit to Supine      Supine to sit: Max assist, +2 for physical assistance, +2 for safety/equipment Sit to supine: Max assist, +2 for physical assistance   General bed mobility comments: assist with trunk and BLEs management    Transfers Overall transfer level: Needs assistance Equipment used: 2 person hand held assist Transfers: Sit to/from Stand Sit to Stand: Max assist, +2 physical assistance           General transfer comment: STS with max A +2 with bedpad for power up and L knee blocked    Ambulation/Gait                   Stairs             Wheelchair Mobility     Tilt Bed    Modified Rankin (Stroke Patients Only) Modified Rankin (Stroke Patients Only) Pre-Morbid Rankin Score: Moderate disability Modified Rankin: Severe disability     Balance Overall balance assessment: Needs assistance Sitting-balance support: Feet supported Sitting balance-Leahy Scale: Poor Sitting balance - Comments: max A sitting EOB due to posterior and left lateral leaning Postural control: Posterior lean, Left lateral lean Standing balance support: Bilateral upper extremity supported, During functional activity Standing balance-Leahy Scale: Zero Standing balance comment: reliant on max A +2                            Communication Communication Communication: Impaired Factors Affecting Communication: Reduced clarity of speech  Cognition Arousal: Alert Behavior During Therapy: Flat affect   PT - Cognitive impairments: Attention, Problem solving, Safety/Judgement, Initiation                         Following commands: Impaired Following commands impaired: Follows  one step commands with increased time    Cueing Cueing Techniques: Verbal cues, Tactile cues  Exercises      General Comments        Pertinent Vitals/Pain Pain Assessment Pain Assessment: Faces Faces Pain Scale: Hurts little more Pain Location: back, shoulder Pain Descriptors / Indicators:  Aching, Grimacing, Discomfort Pain Intervention(s): Limited activity within patient's tolerance, Monitored during session, Repositioned     PT Goals (current goals can now be found in the care plan section) Acute Rehab PT Goals Patient Stated Goal: rest PT Goal Formulation: With patient/family Time For Goal Achievement: 07/10/24 Progress towards PT goals: Progressing toward goals    Frequency    Min 2X/week       AM-PAC PT 6 Clicks Mobility   Outcome Measure  Help needed turning from your back to your side while in a flat bed without using bedrails?: A Lot Help needed moving from lying on your back to sitting on the side of a flat bed without using bedrails?: Total Help needed moving to and from a bed to a chair (including a wheelchair)?: Total Help needed standing up from a chair using your arms (e.g., wheelchair or bedside chair)?: Total Help needed to walk in hospital room?: Total Help needed climbing 3-5 steps with a railing? : Total 6 Click Score: 7    End of Session Equipment Utilized During Treatment: Gait belt Activity Tolerance: Patient limited by fatigue;Patient tolerated treatment well Patient left: with call bell/phone within reach;with family/visitor present;in bed;with bed alarm set Nurse Communication: Mobility status PT Visit Diagnosis: Unsteadiness on feet (R26.81);Hemiplegia and hemiparesis Hemiplegia - Right/Left: Left Hemiplegia - dominant/non-dominant: Non-dominant Hemiplegia - caused by: Cerebral infarction     Time: 8664-8645 PT Time Calculation (min) (ACUTE ONLY): 19 min  Charges:    $Therapeutic Activity: 8-22 mins PT General Charges $$ ACUTE PT VISIT: 1 Visit                     Darryle George, PTA Acute Rehabilitation Services Secure Chat Preferred  [phone]    Darryle George 07/04/2024, 3:26 PM

## 2024-07-04 NOTE — Progress Notes (Signed)
 SLP Cancellation Note  Patient Details Name: [name] MRN: [id number] DOB: 10-14-1932   Cancelled treatment:       Reason Eval/Treat Not Completed: Patient at procedure or test/unavailable. Pt currently having breathing treatment. Will continue efforts.  Nivedita Mirabella B. Dory, MSP, CCC-SLP Speech Language Pathologist Office: [phone]  [name] 07/04/2024, 10:26 AM

## 2024-07-04 NOTE — TOC Progression Note (Signed)
 Transition of Care [facility]) - Progression Note    Patient Details  Name: [name] MRN: [id number] Date of Birth: May 30, 1932  Transition of Care [facility] CM/SW Contact  Almarie CHRISTELLA Goodie, [place] Phone Number: 07/04/2024, 11:37 AM  Clinical Narrative:   CSW coordinated with Rehab Admissions about patient's care needs and disposition. CIR following for progress over the weekend, CSW to follow for SNF assistance if needed.     Expected Discharge Plan: IP Rehab Facility Barriers to Discharge: Continued Medical Work up, English as a second language teacher, SNF Pending bed offer  Expected Discharge Plan and Services   Discharge Planning Services: CM Consult Post Acute Care Choice: Skilled Nursing Facility Living arrangements for the past 2 months: Single Family Home                           HH Arranged: RN, OT, PT, Nurse's Aide HH Agency: [facility] Date [facility] Agency Contacted: 06/25/24 Time HH Agency Contacted: 1007 Representative spoke with at [facility] Agency: Darleene   Social Determinants of Health (SDOH) Interventions SDOH Screenings   Food Insecurity: No Food Insecurity (06/22/2024)  Housing: Low Risk  (06/22/2024)  Transportation Needs: No Transportation Needs (06/22/2024)  Utilities: Not At Risk (06/22/2024)  Social Connections: Socially Isolated (06/22/2024)  Tobacco Use: Medium Risk (06/21/2024)    Readmission Risk Interventions     No data to display

## 2024-07-05 DIAGNOSIS — I63511 Cerebral infarction due to unspecified occlusion or stenosis of right middle cerebral artery: Secondary | ICD-10-CM | POA: Diagnosis not present

## 2024-07-05 DIAGNOSIS — I69391 Dysphagia following cerebral infarction: Secondary | ICD-10-CM | POA: Diagnosis not present

## 2024-07-05 DIAGNOSIS — I609 Nontraumatic subarachnoid hemorrhage, unspecified: Secondary | ICD-10-CM | POA: Diagnosis not present

## 2024-07-05 DIAGNOSIS — I779 Disorder of arteries and arterioles, unspecified: Secondary | ICD-10-CM | POA: Diagnosis not present

## 2024-07-05 LAB — BASIC METABOLIC PANEL WITH GFR
Anion gap: 12 (ref 5–15)
BUN: 27 mg/dL — ABNORMAL HIGH (ref 8–23)
CO2: 26 mmol/L (ref 22–32)
Calcium: 8.9 mg/dL (ref 8.9–10.3)
Chloride: 99 mmol/L (ref 98–111)
Creatinine, Ser: 0.65 mg/dL (ref 0.44–1.00)
GFR, Estimated: >60 mL/min
Glucose, Bld: 111 mg/dL — ABNORMAL HIGH (ref 70–99)
Potassium: 4.5 mmol/L (ref 3.5–5.1)
Sodium: 137 mmol/L (ref 135–145)

## 2024-07-05 LAB — HEPATIC FUNCTION PANEL
ALT: 33 U/L (ref 0–44)
AST: 31 U/L (ref 15–41)
Albumin: 2.6 g/dL — ABNORMAL LOW (ref 3.5–5.0)
Alkaline Phosphatase: 65 U/L (ref 38–126)
Bilirubin, Direct: 0.2 mg/dL (ref 0.0–0.2)
Indirect Bilirubin: 0.4 mg/dL (ref 0.3–0.9)
Total Bilirubin: 0.6 mg/dL (ref 0.0–1.2)
Total Protein: 5.6 g/dL — ABNORMAL LOW (ref 6.5–8.1)

## 2024-07-05 LAB — CBC
HCT: 32.7 % — ABNORMAL LOW (ref 36.0–46.0)
Hemoglobin: 10.6 g/dL — ABNORMAL LOW (ref 12.0–15.0)
MCH: 30.9 pg (ref 26.0–34.0)
MCHC: 32.4 g/dL (ref 30.0–36.0)
MCV: 95.3 fL (ref 80.0–100.0)
Platelets: 285 10*3/uL (ref 150–400)
RBC: 3.43 MIL/uL — ABNORMAL LOW (ref 3.87–5.11)
RDW: 13.8 % (ref 11.5–15.5)
WBC: 6.8 10*3/uL (ref 4.0–10.5)
nRBC: 0 % (ref 0.0–0.2)

## 2024-07-05 LAB — GLUCOSE, CAPILLARY
Glucose-Capillary: 103 mg/dL — ABNORMAL HIGH (ref 70–99)
Glucose-Capillary: 105 mg/dL — ABNORMAL HIGH (ref 70–99)
Glucose-Capillary: 107 mg/dL — ABNORMAL HIGH (ref 70–99)
Glucose-Capillary: 111 mg/dL — ABNORMAL HIGH (ref 70–99)
Glucose-Capillary: 117 mg/dL — ABNORMAL HIGH (ref 70–99)
Glucose-Capillary: 123 mg/dL — ABNORMAL HIGH (ref 70–99)

## 2024-07-05 MED ORDER — CYCLOBENZAPRINE HCL 10 MG PO TABS
5.0000 mg | ORAL_TABLET | Freq: Three times a day (TID) | ORAL | Status: DC | PRN
  Administered 2024-07-05 – 2024-07-08 (×4): 5 mg
  Filled 2024-07-05 (×4): qty 1

## 2024-07-05 MED ORDER — ATORVASTATIN CALCIUM 10 MG PO TABS
10.0000 mg | ORAL_TABLET | Freq: Every day | ORAL | Status: DC
  Administered 2024-07-05 – 2024-07-08 (×4): 10 mg
  Filled 2024-07-05 (×4): qty 1

## 2024-07-05 NOTE — Plan of Care (Signed)

## 2024-07-05 NOTE — Progress Notes (Signed)
 37 STROKE TEAM PROGRESS NOTE   SIGNIFICANT HOSPITAL EVENTS 7/5: Admitted for RUQ pain, nausea, vomiting E. Coli bacteremia treated with 5 days of ceftriaxone  7/9: 18:15 daughter witnessed patient with sudden onset slurred speech, left-sided weakness, and right gaze deviation for which a Code Stroke was activated - TNKase  administered (no contraindication per primary team and surgery) - CTH with right MCA M1 occlusion, taken to IR for thrombectomy with TICI 2B revacaularization - Remained intubated for airway protection and admitted to the ICU overnight   - Sedation paused this morning with some prolonged sedating effects  7/10:Extubated 1230 7/11: corTrak placed, started on ASA  INTERIM HISTORY/SUBJECTIVE Daughter is at the bedside.  Patient laying in the bed in no apparent distress. AAO x 3. Complaining of neck pain, has given tylenol . Still has left hemiplegia but language intact, pending SNF, but may need PEG.   CBC    Component Value Date/Time   WBC 6.8 07/05/2024 0423   RBC 3.43 (L) 07/05/2024 0423   HGB 10.6 (L) 07/05/2024 0423   HGB 13.2 05/05/2024 1343   HCT 32.7 (L) 07/05/2024 0423   PLT 285 07/05/2024 0423   PLT 150 05/05/2024 1343   MCV 95.3 07/05/2024 0423   MCH 30.9 07/05/2024 0423   MCHC 32.4 07/05/2024 0423   RDW 13.8 07/05/2024 0423   LYMPHSABS 0.8 06/26/2024 0530   MONOABS 0.9 06/26/2024 0530   EOSABS 0.2 06/26/2024 0530   BASOSABS 0.1 06/26/2024 0530   BMET    Component Value Date/Time   NA 137 07/05/2024 0423   K 4.5 07/05/2024 0423   CL 99 07/05/2024 0423   CO2 26 07/05/2024 0423   GLUCOSE 111 (H) 07/05/2024 0423   BUN 27 (H) 07/05/2024 0423   CREATININE 0.65 07/05/2024 0423   CREATININE 0.74 05/05/2024 1343   CALCIUM  8.9 07/05/2024 0423   GFRNONAA >60 07/05/2024 0423   GFRNONAA >60 05/05/2024 1343   Lab Results  Component Value Date   HGBA1C 5.1 06/26/2024    Lab Results  Component Value Date   CHOL 96 06/26/2024   HDL 35 (L) 06/26/2024    LDLCALC 46 06/26/2024   TRIG 74 06/26/2024   TRIG 74 06/26/2024   CHOLHDL 2.7 06/26/2024   Drugs of Abuse  No results found for: LABOPIA, COCAINSCRNUR, LABBENZ, AMPHETMU, THCU, LABBARB   Urinalysis    Component Value Date/Time   COLORURINE YELLOW 06/21/2024 1532   APPEARANCEUR CLEAR 06/21/2024 1532   LABSPEC 1.013 06/21/2024 1532   PHURINE 7.0 06/21/2024 1532   GLUCOSEU NEGATIVE 06/21/2024 1532   HGBUR MODERATE (A) 06/21/2024 1532   BILIRUBINUR NEGATIVE 06/21/2024 1532   KETONESUR NEGATIVE 06/21/2024 1532   PROTEINUR NEGATIVE 06/21/2024 1532   UROBILINOGEN 1.0 11/01/2007 1614   NITRITE POSITIVE (A) 06/21/2024 1532   LEUKOCYTESUR NEGATIVE 06/21/2024 1532   IMAGING past 24 hours No results found.  Vitals:   07/05/24 0856 07/05/24 0946 07/05/24 1119 07/05/24 1546  BP: (!) 149/51   (!) 113/50  Pulse: (!) 56 (!) 54 (!) 58 63  Resp:  14  17  Temp: 97.8 F (36.6 C)  98.2 F (36.8 C) 98.9 F (37.2 C)  TempSrc: Oral  Oral Oral  SpO2: 97% 97% 97% 94%  Weight:      Height:       PHYSICAL EXAM General:  Critically ill appearing, elderly, Caucasian female laying in bed CV: Sinus bradycardia with rate in the 50's Respiratory: Room air GI: Abdomen soft and nontender  NEURO:  Mental Status:  awake and alert.  She is oriented to self, age, month situation and place. She can follow commands.   Cranial Nerves:  II: PERRL.  left hemianopsia, no blink to threat  III, IV, VI: Right gaze preference, will track towards midline but will not cross midline towards the left V: Does not respond to light touch on either side of the face VII: left facial droop  VIII: Hearing is intact to voice. IX, X: cough present  XI: Head is grossly midline XII: midline Motor: Left upper extremity is flaccid with withdrawal of the left lower extremity. Right arm and right leg spontaneous movement Sensation: decreased on left  Coordination: unable to assess Gait:  Deferred  ASSESSMENT/PLAN Ms. SALVADOR BIGBEE is a 89 y.o. female with history of COPD, PAD, HLD, OSA, right breast carcinoma stage Ia s/p lumpectomy, radiation, and hormone therapy in remission admitted initially on 7/5 with RUQ abdominal pain, nausea, vomiting with initial workup revealing elevated LFTs and possible cholecystitis/E. coli bacteremia s/p 5 days of ceftriaxone .  Hospital course complicated by hallucinations, encephalopathy, some difficulty breathing treated with diuretics. Patient developed a sudden onset of left-sided weakness with a right gaze deviation and slurred speech.  Stroke:  right MCA territory infarct with R M1 occlusion s/p TNK and IR with TICI 2B initially but persistent occlusion eventually, etiology:  most likely large vessel disease    Code Stroke CT head No CT evidence of acute intracranial abnormality. ASPECTS is 10. CTA head & neck Abrupt occlusion of the distal M1 segment of the right middle cerebral artery S/p IR with repeated  reocclusion secondary to underlying long segment atherosclerotic plaque and final score TICI2b Post IR CT small focal right perisylvian subarachnoid hemorrhage. MRI brain  Evolving right MCA territory infarct. Mild associated petechial blood products at the right frontoparietal region. No significant regional mass effect. 2. Small volume acute subarachnoid hemorrhage at the base of the right Sylvian fissure.   MRA Persistent and/or recurrent proximal right M2 occlusion, superior division.  2D Echo LVEF 60-65%, mild atrial dilation LDL 46 HgbA1c 5.1 VTE prophylaxis - heparin  subq aspirin  81 mg daily prior to admission, now on aspirin  81 mg daily given SAH. Will add plavix if no more procedure planned Therapy recommendations:  SNF vs CIR Disposition:  Pending  ? Hx of Stroke/TIA Acute onset of vertigo and gait disturbance in June 2018 with residual truncal ataxia requiring use of walker for ambulation  Concern for possible  brainstem/cerebellar stroke at that time  Recommended for MRI evaluation at that time but this was lost to follow up   Acute Acalculous Cholecystitis E. Coli bacteremia Elevated LFTs, resolved Admitted for right upper quadrant pain and nausea vomiting. 7/5 blood cultures + for E. Coli S/p Flagyl  Continue Cefadoxil  AST/ALT 1658/958 -> ...-> 25/57->37/60->25/39->31/33 Less likely endocarditis given the bacteria species  HTN Home meds:  None Stable Add PRN labetalol , hydralazine  for BP control  Blood pressure goal SBP <160 given SAH Long term BP goal: Normotension  Hyperlipidemia Home meds:  atorvastatin  10 mg LDL 46, goal < 70 Now on lipitor 10 Consider resume statin on discharge  Dysphagia Patient has post-stroke dysphagia SLP consult Currently n.p.o. S/p cortrak  Continue TF @ 42 May need PEG if SNF placement  Other Stroke Risk Factors Advanced age Family hx stroke (possible stroke versus MI in patient's father) Remote tobacco use history 56-pack-year history, stopped smoking 8 years ago CAD/PAD Obstructive sleep apnea, on CPAP at home though reported not using effectively  PTA  Other Active Problems Anemia, Hbg 13.8 on admission > 10.6 Depression on Lexapro  COPD  Hospital day # 14  I had long discussion with daughter at bedside, updated pt current condition, treatment plan and potential prognosis, and answered all the questions. She expressed understanding and appreciation.    Ary Cummins, MD PhD Stroke Neurology 07/05/2024 4:34 PM   To contact Stroke Continuity provider, please refer to [email or web address]. After hours, contact General Neurology

## 2024-07-06 DIAGNOSIS — I779 Disorder of arteries and arterioles, unspecified: Secondary | ICD-10-CM | POA: Diagnosis not present

## 2024-07-06 DIAGNOSIS — I609 Nontraumatic subarachnoid hemorrhage, unspecified: Secondary | ICD-10-CM | POA: Diagnosis not present

## 2024-07-06 DIAGNOSIS — I63511 Cerebral infarction due to unspecified occlusion or stenosis of right middle cerebral artery: Secondary | ICD-10-CM | POA: Diagnosis not present

## 2024-07-06 DIAGNOSIS — I69391 Dysphagia following cerebral infarction: Secondary | ICD-10-CM | POA: Diagnosis not present

## 2024-07-06 LAB — CBC
HCT: 32.8 % — ABNORMAL LOW (ref 36.0–46.0)
Hemoglobin: 10.8 g/dL — ABNORMAL LOW (ref 12.0–15.0)
MCH: 31.2 pg (ref 26.0–34.0)
MCHC: 32.9 g/dL (ref 30.0–36.0)
MCV: 94.8 fL (ref 80.0–100.0)
Platelets: 274 K/uL (ref 150–400)
RBC: 3.46 MIL/uL — ABNORMAL LOW (ref 3.87–5.11)
RDW: 13.8 % (ref 11.5–15.5)
WBC: 6.6 K/uL (ref 4.0–10.5)
nRBC: 0 % (ref 0.0–0.2)

## 2024-07-06 LAB — GLUCOSE, CAPILLARY
Glucose-Capillary: 100 mg/dL — ABNORMAL HIGH (ref 70–99)
Glucose-Capillary: 103 mg/dL — ABNORMAL HIGH (ref 70–99)
Glucose-Capillary: 110 mg/dL — ABNORMAL HIGH (ref 70–99)
Glucose-Capillary: 115 mg/dL — ABNORMAL HIGH (ref 70–99)
Glucose-Capillary: 117 mg/dL — ABNORMAL HIGH (ref 70–99)
Glucose-Capillary: 95 mg/dL (ref 70–99)

## 2024-07-06 LAB — BASIC METABOLIC PANEL WITH GFR
Anion gap: 11 (ref 5–15)
BUN: 28 mg/dL — ABNORMAL HIGH (ref 8–23)
CO2: 26 mmol/L (ref 22–32)
Calcium: 9.1 mg/dL (ref 8.9–10.3)
Chloride: 100 mmol/L (ref 98–111)
Creatinine, Ser: 0.66 mg/dL (ref 0.44–1.00)
GFR, Estimated: >60 mL/min (ref >60)
Glucose, Bld: 114 mg/dL — ABNORMAL HIGH (ref 70–99)
Potassium: 4.3 mmol/L (ref 3.5–5.1)
Sodium: 137 mmol/L (ref 135–145)

## 2024-07-06 MED ORDER — TRAMADOL HCL 50 MG PO TABS
50.0000 mg | ORAL_TABLET | Freq: Two times a day (BID) | ORAL | Status: DC | PRN
  Administered 2024-07-06 – 2024-07-08 (×3): 50 mg via ORAL
  Filled 2024-07-06 (×4): qty 1

## 2024-07-06 NOTE — Progress Notes (Signed)
 37 STROKE TEAM PROGRESS NOTE   SIGNIFICANT HOSPITAL EVENTS 7/5: Admitted for RUQ pain, nausea, vomiting E. Coli bacteremia treated with 5 days of ceftriaxone  7/9: 18:15 daughter witnessed patient with sudden onset slurred speech, left-sided weakness, and right gaze deviation for which a Code Stroke was activated - TNKase  administered (no contraindication per primary team and surgery) - CTH with right MCA M1 occlusion, taken to IR for thrombectomy with TICI 2B revacaularization - Remained intubated for airway protection and admitted to the ICU overnight   - Sedation paused this morning with some prolonged sedating effects  7/10:Extubated 1230 7/11: corTrak placed, started on ASA  INTERIM HISTORY/SUBJECTIVE Daughter is at the bedside.  Patient is drowsy sleepy.  Complained continued back pain earlier per RN, not relieved by Tylenol  and Flexeril .  Discussed with daughter, try to avoid narcotic during the day to interfere with speech therapy for PT and OT.  Ordered tramadol  as needed every 12 hours  CBC    Component Value Date/Time   WBC 6.6 07/06/2024 0441   RBC 3.46 (L) 07/06/2024 0441   HGB 10.8 (L) 07/06/2024 0441   HGB 13.2 05/05/2024 1343   HCT 32.8 (L) 07/06/2024 0441   PLT 274 07/06/2024 0441   PLT 150 05/05/2024 1343   MCV 94.8 07/06/2024 0441   MCH 31.2 07/06/2024 0441   MCHC 32.9 07/06/2024 0441   RDW 13.8 07/06/2024 0441   LYMPHSABS 0.8 06/26/2024 0530   MONOABS 0.9 06/26/2024 0530   EOSABS 0.2 06/26/2024 0530   BASOSABS 0.1 06/26/2024 0530   BMET    Component Value Date/Time   NA 137 07/06/2024 0441   K 4.3 07/06/2024 0441   CL 100 07/06/2024 0441   CO2 26 07/06/2024 0441   GLUCOSE 114 (H) 07/06/2024 0441   BUN 28 (H) 07/06/2024 0441   CREATININE 0.66 07/06/2024 0441   CREATININE 0.74 05/05/2024 1343   CALCIUM  9.1 07/06/2024 0441   GFRNONAA >60 07/06/2024 0441   GFRNONAA >60 05/05/2024 1343   Lab Results  Component Value Date   HGBA1C 5.1 06/26/2024     Lab Results  Component Value Date   CHOL 96 06/26/2024   HDL 35 (L) 06/26/2024   LDLCALC 46 06/26/2024   TRIG 74 06/26/2024   TRIG 74 06/26/2024   CHOLHDL 2.7 06/26/2024   Drugs of Abuse  No results found for: LABOPIA, COCAINSCRNUR, LABBENZ, AMPHETMU, THCU, LABBARB   Urinalysis    Component Value Date/Time   COLORURINE YELLOW 06/21/2024 1532   APPEARANCEUR CLEAR 06/21/2024 1532   LABSPEC 1.013 06/21/2024 1532   PHURINE 7.0 06/21/2024 1532   GLUCOSEU NEGATIVE 06/21/2024 1532   HGBUR MODERATE (A) 06/21/2024 1532   BILIRUBINUR NEGATIVE 06/21/2024 1532   KETONESUR NEGATIVE 06/21/2024 1532   PROTEINUR NEGATIVE 06/21/2024 1532   UROBILINOGEN 1.0 11/01/2007 1614   NITRITE POSITIVE (A) 06/21/2024 1532   LEUKOCYTESUR NEGATIVE 06/21/2024 1532   IMAGING past 24 hours No results found.  Vitals:   07/06/24 0729 07/06/24 1147 07/06/24 1606 07/06/24 2031  BP: (!) 151/63 (!) 126/48 (!) 125/51 (!) 125/54  Pulse: (!) 54 (!) 57 (!) 51 (!) 56  Resp: 17 17 16    Temp: 97.9 F (36.6 C) (!) 97.3 F (36.3 C) 98.3 F (36.8 C) 99.1 F (37.3 C)  TempSrc: Oral Oral Oral Oral  SpO2: 99% 96% 98% 99%  Weight:      Height:       PHYSICAL EXAM General:  Critically ill appearing, elderly, Caucasian female laying in bed CV:  Sinus bradycardia with rate in the 50's Respiratory: Room air GI: Abdomen soft and nontender  NEURO:  Mental Status: awake and alert.  She is oriented to self, age, month situation and place. She can follow commands.   Cranial Nerves:  II: PERRL.  left hemianopsia, no blink to threat  III, IV, VI: Right gaze preference, will track towards midline but will not cross midline towards the left V: Does not respond to light touch on either side of the face VII: left facial droop  VIII: Hearing is intact to voice. IX, X: cough present  XI: Head is grossly midline XII: midline Motor: Left upper extremity is flaccid with withdrawal of the left lower extremity.  Right arm and right leg spontaneous movement Sensation: decreased on left  Coordination: unable to assess Gait: Deferred  ASSESSMENT/PLAN Ms. STEFANIA GOULART is a 89 y.o. female with history of COPD, PAD, HLD, OSA, right breast carcinoma stage Ia s/p lumpectomy, radiation, and hormone therapy in remission admitted initially on 7/5 with RUQ abdominal pain, nausea, vomiting with initial workup revealing elevated LFTs and possible cholecystitis/E. coli bacteremia s/p 5 days of ceftriaxone .  Hospital course complicated by hallucinations, encephalopathy, some difficulty breathing treated with diuretics. Patient developed a sudden onset of left-sided weakness with a right gaze deviation and slurred speech.  Stroke:  right MCA territory infarct with R M1 occlusion s/p TNK and IR with TICI 2B initially but persistent occlusion eventually, etiology:  most likely large vessel disease    Code Stroke CT head No CT evidence of acute intracranial abnormality. ASPECTS is 10. CTA head & neck Abrupt occlusion of the distal M1 segment of the right middle cerebral artery S/p IR with repeated  reocclusion secondary to underlying long segment atherosclerotic plaque and final score TICI2b Post IR CT small focal right perisylvian subarachnoid hemorrhage. MRI brain  Evolving right MCA territory infarct. Mild associated petechial blood products at the right frontoparietal region. No significant regional mass effect. 2. Small volume acute subarachnoid hemorrhage at the base of the right Sylvian fissure.   MRA Persistent and/or recurrent proximal right M2 occlusion, superior division.  2D Echo LVEF 60-65%, mild atrial dilation LDL 46 HgbA1c 5.1 VTE prophylaxis - heparin  subq aspirin  81 mg daily prior to admission, now on aspirin  81 mg daily given SAH. Will add plavix if no more procedure planned Therapy recommendations:  SNF vs CIR Disposition:  Pending  ? Hx of Stroke/TIA Acute onset of vertigo and gait disturbance in  June 2018 with residual truncal ataxia requiring use of walker for ambulation  Concern for possible brainstem/cerebellar stroke at that time  Recommended for MRI evaluation at that time but this was lost to follow up   Acute Acalculous Cholecystitis E. Coli bacteremia Elevated LFTs, resolved Admitted for right upper quadrant pain and nausea vomiting. 7/5 blood cultures + for E. Coli S/p Flagyl  Continue Cefadoxil  AST/ALT 1658/958 -> ...-> 25/57->37/60->25/39->31/33 Less likely endocarditis given the bacteria species  HTN Home meds:  None Stable Add PRN labetalol , hydralazine  for BP control  Blood pressure goal SBP <160 given SAH Long term BP goal: Normotension  Hyperlipidemia Home meds:  atorvastatin  10 mg LDL 46, goal < 70 Now on lipitor 10 Consider resume statin on discharge  Dysphagia Patient has post-stroke dysphagia SLP consult Currently n.p.o. S/p cortrak  Continue TF @ 40 May need PEG if SNF placement  Other Stroke Risk Factors Advanced age Family hx stroke (possible stroke versus MI in patient's father) Remote tobacco use history  56-pack-year history, stopped smoking 8 years ago CAD/PAD Obstructive sleep apnea, on CPAP at home though reported not using effectively PTA  Other Active Problems Anemia, Hbg 13.8 on admission > 10.6 Depression on Lexapro  COPD Lower back pain, on Tylenol  and the Flexeril  as needed.  Will add tramadol  as needed too  Hospital day # 15   Ary Cummins, MD PhD Stroke Neurology 07/06/2024 9:46 PM   To contact Stroke Continuity provider, please refer to [email or web address]. After hours, contact General Neurology

## 2024-07-06 NOTE — Plan of Care (Signed)
  Problem: Clinical Measurements: Goal: Ability to maintain clinical measurements within normal limits will improve Outcome: Progressing Goal: Will remain free from infection Outcome: Progressing Goal: Diagnostic test results will improve Outcome: Progressing Goal: Respiratory complications will improve Outcome: Progressing Goal: Cardiovascular complication will be avoided Outcome: Progressing   Problem: Nutrition: Goal: Adequate nutrition will be maintained Outcome: Progressing   Problem: Pain Managment: Goal: General experience of comfort will improve and/or be controlled Outcome: Progressing   Problem: Elimination: Goal: Will not experience complications related to bowel motility Outcome: Progressing Goal: Will not experience complications related to urinary retention Outcome: Progressing   Problem: Safety: Goal: Ability to remain free from injury will improve Outcome: Progressing   Problem: Skin Integrity: Goal: Risk for impaired skin integrity will decrease Outcome: Progressing

## 2024-07-06 NOTE — Plan of Care (Signed)
  Problem: Education: Goal: Knowledge of General Education information will improve Description: Including pain rating scale, medication(s)/side effects and non-pharmacologic comfort measures 07/06/2024 0603 by Gaetana Randall Mathew GORMAN, RN Outcome: Progressing 07/06/2024 0518 by Gaetana Randall Mathew GORMAN, RN Outcome: Progressing   Problem: Ischemic Stroke/TIA Tissue Perfusion: Goal: Complications of ischemic stroke/TIA will be minimized 07/06/2024 0603 by Gaetana Randall Mathew GORMAN, RN Outcome: Progressing 07/06/2024 0518 by Gaetana Randall Mathew GORMAN, RN Outcome: Progressing   Problem: Education: Goal: Knowledge of disease or condition will improve 07/06/2024 0603 by Gaetana Randall Mathew GORMAN, RN Outcome: Progressing 07/06/2024 0518 by Gaetana Randall Mathew GORMAN, RN Outcome: Progressing Goal: Knowledge of secondary prevention will improve (MUST DOCUMENT ALL) 07/06/2024 0603 by Gaetana Randall Mathew GORMAN, RN Outcome: Progressing 07/06/2024 0518 by Gaetana Randall Mathew GORMAN, RN Outcome: Progressing Goal: Knowledge of patient specific risk factors will improve (DELETE if not current risk factor) 07/06/2024 0603 by Gaetana Randall Mathew GORMAN, RN Outcome: Progressing 07/06/2024 0518 by Gaetana Randall Mathew GORMAN, RN Outcome: Progressing

## 2024-07-07 DIAGNOSIS — I69391 Dysphagia following cerebral infarction: Secondary | ICD-10-CM | POA: Diagnosis not present

## 2024-07-07 DIAGNOSIS — I63511 Cerebral infarction due to unspecified occlusion or stenosis of right middle cerebral artery: Secondary | ICD-10-CM | POA: Diagnosis not present

## 2024-07-07 DIAGNOSIS — I779 Disorder of arteries and arterioles, unspecified: Secondary | ICD-10-CM | POA: Diagnosis not present

## 2024-07-07 DIAGNOSIS — I609 Nontraumatic subarachnoid hemorrhage, unspecified: Secondary | ICD-10-CM | POA: Diagnosis not present

## 2024-07-07 LAB — GLUCOSE, CAPILLARY
Glucose-Capillary: 110 mg/dL — ABNORMAL HIGH (ref 70–99)
Glucose-Capillary: 113 mg/dL — ABNORMAL HIGH (ref 70–99)
Glucose-Capillary: 120 mg/dL — ABNORMAL HIGH (ref 70–99)
Glucose-Capillary: 123 mg/dL — ABNORMAL HIGH (ref 70–99)
Glucose-Capillary: 127 mg/dL — ABNORMAL HIGH (ref 70–99)
Glucose-Capillary: 92 mg/dL (ref 70–99)

## 2024-07-07 LAB — CBC
HCT: 31.7 % — ABNORMAL LOW (ref 36.0–46.0)
Hemoglobin: 10.3 g/dL — ABNORMAL LOW (ref 12.0–15.0)
MCH: 31 pg (ref 26.0–34.0)
MCHC: 32.5 g/dL (ref 30.0–36.0)
MCV: 95.5 fL (ref 80.0–100.0)
Platelets: 275 K/uL (ref 150–400)
RBC: 3.32 MIL/uL — ABNORMAL LOW (ref 3.87–5.11)
RDW: 13.9 % (ref 11.5–15.5)
WBC: 6.9 K/uL (ref 4.0–10.5)
nRBC: 0 % (ref 0.0–0.2)

## 2024-07-07 LAB — BASIC METABOLIC PANEL WITH GFR
Anion gap: 12 (ref 5–15)
BUN: 26 mg/dL — ABNORMAL HIGH (ref 8–23)
CO2: 25 mmol/L (ref 22–32)
Calcium: 8.9 mg/dL (ref 8.9–10.3)
Chloride: 99 mmol/L (ref 98–111)
Creatinine, Ser: 0.66 mg/dL (ref 0.44–1.00)
GFR, Estimated: >60 mL/min (ref >60)
Glucose, Bld: 118 mg/dL — ABNORMAL HIGH (ref 70–99)
Potassium: 4.7 mmol/L (ref 3.5–5.1)
Sodium: 136 mmol/L (ref 135–145)

## 2024-07-07 NOTE — Progress Notes (Addendum)
 Inpatient Rehab Admissions Coordinator:   Addendum: All updated notes in. Opening for prior auth for CIR admission. Will continue to follow.   Met with patient and caregiver. We await updated therapy notes and will then send to insurance for prior authorization. Will continue to follow for potential CIR admission.   Rehab Admissons Coordinator Anders Hohmann, [place], [place] [phone]

## 2024-07-07 NOTE — Progress Notes (Signed)
 Speech Language Pathology Treatment: Dysphagia;Cognitive-Linguistic  Patient Details Name: [name] MRN: [id number] DOB: Apr 29, 1932 Today's Date: 07/07/2024 Time: 8884-8867 SLP Time Calculation (min) (ACUTE ONLY): 17 min  Assessment / Plan / Recommendation Clinical Impression  Pt presents with increasing alertness and intelligibility compared to most recent session with this SLP 7/14. She requires cueing to verbalize wants/needs and to participate in conversation but her output is more intelligible. Provided thorough oral care with pt endorsing mouth soreness. She took ice chips, swallowing audibly multiple times. As the session progressed, trialed small amounts of water  via tspn which consistently resulted in immediate coughing. Subjectively her cough sounds slightly crisper in quality, although question its effectiveness given fatigue. Completed EMST set to 5 cm/H2O x10 but she was unable to maintain a labial seal with a higher setting. Encouraged pt's daughter, [name], to continue encouraging use of EMST and effortful swallow. Lethargy remains a significant barrier to reassessing pt's swallow but [name] reports pt is typically more alert in the morning. SLP will plan to proceed with an MBS next date (preferably in the morning) as scheduling allows. Pending completion, recommend she remain NPO except ice chips in moderation after oral care.     HPI HPI: [name] is an 89 yo female presenting to ED 7/5 for RUQ pain, nausea/vomiting. Found to have suspected cholecystitis vs passed choledocholithiasis and acute liver injury, hyponatremia, bacteriuria, and hematuria. [facility] seen at [facility] ED 7/3 with n/v and elevated LFTs but left without being seen. Daughter noted sudden onset AMS and L sided weakness 7/9 with CTA showing abrupt occlusion of distal R MCA M1 s/p TNK. Taken to IR with unsuccessful revascularization (repeated reocclusion), post-procedure CTH showed small SAH. Remained intubated  after the procedure, 7/9-7/10. PMH includes HLD, PAD, COPD with emphysema, OSA, R breast cancer, back pain, esophageal spasm      SLP Plan  MBS          Recommendations  Diet recommendations: NPO Medication Administration: Via alternative means                  Oral care QID;Oral care prior to ice chip/H20   Frequent or constant Supervision/Assistance Dysphagia, oropharyngeal phase (R13.12);Dysarthria and anarthria (R47.1);Cognitive communication deficit (M58.158)     MBS     Damien Blumenthal, M.A., CCC-SLP Speech Language Pathology, Acute Rehabilitation Services  Secure Chat preferred [phone]   07/07/2024, 11:41 AM

## 2024-07-07 NOTE — Plan of Care (Signed)

## 2024-07-07 NOTE — Progress Notes (Signed)
 Occupational Therapy Treatment Patient Details Name: [name] MRN: [id number] DOB: [birth year] Today's Date: 07/07/2024   History of present illness Pt is an 89 y.o. female presenting 7/5 with RUQ abdominal pain radiating to back, nausea. Found to have suspected cholecystitis, hyponatremia, bacteriuria, hematuria. [facility] visit 7/3 Lab work demonstrates acute liver injury but pt leaving without being seen. 7/09 R MCA occlusion; received TNK; to IR with unsuccessful revascularization (repeated reocclusion); remained intubated until 7/10;  PMH: COPD, PAD, HLD, OSA, breast cancer.   OT comments  Pt very fatigued during OT session, requiring total A to transfer and up to max A to maintain static sitting balance. Pt noted to have possible trace of L shoulder adduction, there is some tone present but it comes and goes. With use of multimodal cues pt increasing her awareness to LUE. Based on today's assessment pt needing min A to Total A for ADL assistance. OT to continue efforts to progress pt as able. Patient has the potential to reach Mod I and demos the ability to tolerate 3 hours of therapy. Pt would benefit from an intensive rehab program to help maximize functional independence.       If plan is discharge home, recommend the following:  Assist for transportation;Assistance with cooking/housework;Two people to help with walking and/or transfers;A lot of help with bathing/dressing/bathroom   Equipment Recommendations  Wheelchair cushion (measurements OT);Wheelchair (measurements OT);Hospital bed    Recommendations for Other Services      Precautions / Restrictions Precautions Precautions: Fall Recall of Precautions/Restrictions: Impaired Precaution/Restrictions Comments: [employer] Restrictions Weight Bearing Restrictions Per Provider Order: No       Mobility Bed Mobility Overal bed mobility: Needs Assistance Bed Mobility: Sit to Supine       Sit to supine: Total assist, Used  rails   General bed mobility comments: Cues for pt to utilize bed rail with RUE and pull/shift weight R to address L lean.    Transfers Overall transfer level: Needs assistance   Transfers: Sit to/from Stand, Bed to chair/wheelchair/BSC Sit to Stand: Total assist Stand pivot transfers: Total assist         General transfer comment: Attempted to use stedy but pt unable to hold anterior lean without significant Ext support and has very little strength to initiate offloading bottom.     Balance Overall balance assessment: Needs assistance Sitting-balance support: Feet supported, Single extremity supported, No upper extremity supported   Sitting balance - Comments: max A for sitting balance due to L lateral lean, pt using R rail to hold onto and correct lean slightly. Postural control: Left lateral lean   Standing balance-Leahy Scale: Zero Standing balance comment: total A                           ADL either performed or assessed with clinical judgement   ADL   Eating/Feeding: NPO   Grooming: Sitting;Wash/dry face;Minimal assistance Grooming Details (indicate cue type and reason): supported sitting in recliner, min A for throughness. Pt washing L side of face with RUE. Upper Body Bathing: Total assistance;Sitting   Lower Body Bathing: Total assistance;Bed level   Upper Body Dressing : Total assistance       Toilet Transfer: Total assistance Toilet Transfer Details (indicate cue type and reason): simulated with transfer from recliner to bed.           General ADL Comments: Pt needing total A for ADLs and transfers at this time. Limited initation  for ADLs and very lethargic to perform much.    Extremity/Trunk Assessment Upper Extremity Assessment LUE Deficits / Details: Pt with trace shoulder adduction, possibly just tone given inconsistency BUT pt also very fatigued and hard to discern. LUE Sensation: decreased light touch;decreased proprioception LUE  Coordination: decreased fine motor;decreased gross motor            Vision   Vision Assessment?: Vision impaired- to be further tested in functional context Additional Comments: R gaze preference, with verbal/visual cues pt will attend to L side more. Difficult to discern vision given limited FC ability and decreased attention.   Perception Perception Perception: Impaired Preception Impairment Details: Inattention/Neglect Perception-Other Comments: Lt inattention   Praxis Praxis Praxis: Impaired Praxis Impairment Details: Initiation   Communication Communication Communication: Impaired Factors Affecting Communication: Reduced clarity of speech   Cognition Arousal: Lethargic Behavior During Therapy: Flat affect Cognition: Cognition impaired     Awareness: Intellectual awareness impaired, Online awareness impaired Memory impairment (select all impairments): Working [employer] fast streamer, Conservation officer, historic buildings Attention impairment (select first level of impairment): Sustained attention, Focused attention Executive functioning impairment (select all impairments): Reasoning, Problem solving                   Following commands: Impaired Following commands impaired: Follows one step commands with increased time      Cueing   Cueing Techniques: Verbal cues, Tactile cues  Exercises Other Exercises Other Exercises: PNF D2 flexion/ext LUE. Other Exercises: Locating LUE using her RUE with visual cues. Other Exercises: Educating family on ways to promote Lt attention.    Shoulder Instructions       General Comments VSS, HR in the 50s with activity. Pt left in sidelying for pressure relief, pillows placed around bony prominences.    Pertinent Vitals/ Pain       Pain Assessment Pain Assessment: Faces Faces Pain Scale: Hurts little more Pain Location: Pt reports all over generalized pain Pain Descriptors / Indicators: Aching, Grimacing, Discomfort Pain Intervention(s):  Monitored during session, Limited activity within patient's tolerance  Home Living                                          Prior Functioning/Environment              Frequency  Min 2X/week        Progress Toward Goals  OT Goals(current goals can now be found in the care plan section)  Progress towards OT goals: Progressing toward goals  Acute Rehab OT Goals Patient Stated Goal: go home OT Goal Formulation: With patient Time For Goal Achievement: 07/11/24 Potential to Achieve Goals: Fair  Plan      Co-evaluation                 AM-PAC OT 6 Clicks Daily Activity     Outcome Measure   Help from another person eating meals?: Total Help from another person taking care of personal grooming?: A Little Help from another person toileting, which includes using toliet, bedpan, or urinal?: Total Help from another person bathing (including washing, rinsing, drying)?: Total Help from another person to put on and taking off regular upper body clothing?: Total Help from another person to put on and taking off regular lower body clothing?: Total 6 Click Score: 8    End of Session Equipment Utilized During Treatment: Gait belt  OT Visit Diagnosis: Unsteadiness  on feet (R26.81);Muscle weakness (generalized) (M62.81);Hemiplegia and hemiparesis Hemiplegia - Right/Left: Left Hemiplegia - dominant/non-dominant: Non-Dominant Hemiplegia - caused by: Cerebral infarction   Activity Tolerance Patient limited by fatigue   Patient Left in bed;with bed alarm set;with family/visitor present;with call bell/phone within reach   Nurse Communication Mobility status        Time: 8768-8692 OT Time Calculation (min): 36 min  Charges: OT General Charges $OT Visit: 1 Visit OT Treatments $Therapeutic Activity: 23-37 mins  07/07/2024  AB, OTR/L  Acute Rehabilitation Services  Office: [phone]   [name] 07/07/2024, 1:33 PM

## 2024-07-07 NOTE — Progress Notes (Signed)
 Physical Therapy Treatment Patient Details Name: [name] MRN: [id number] DOB: 1932/07/06 Today's Date: 07/07/2024   History of Present Illness Pt is an 89 y.o. female presenting 7/5 with RUQ abdominal pain radiating to back, nausea. Found to have suspected cholecystitis, hyponatremia, bacteriuria, hematuria. [facility] visit 7/3 Lab work demonstrates acute liver injury but pt leaving without being seen. 7/09 R MCA occlusion; received TNK; to IR with unsuccessful revascularization (repeated reocclusion); remained intubated until 7/10;  PMH: COPD, PAD, HLD, OSA, breast cancer.    PT Comments  Pt received in supine and agreeable to session. Pt able to initiate mobility tasks with cues, however requires grossly max A +2 to complete due to weakness. Pt demonstrates poor sitting balance with L lateral lean despite cues requiring assist to correct, however pt aware of lean and able to identify direction when asked. Pt's knees noted to buckle during pivot to the recliner requiring assist to guide to the chair. Pt continues to benefit from PT services to progress toward functional mobility goals.     If plan is discharge home, recommend the following: Assist for transportation;Supervision due to cognitive status;Two people to help with walking and/or transfers;Assistance with cooking/housework;Assistance with feeding;Direct supervision/assist for medications management;Direct supervision/assist for financial management;Help with stairs or ramp for entrance   Can travel by private vehicle     No  Equipment Recommendations  Wheelchair (measurements PT);Wheelchair cushion (measurements PT);Hoyer lift    Recommendations for Other Services       Precautions / Restrictions Precautions Precautions: Fall Precaution/Restrictions Comments: Cortrak Restrictions [employer] Bearing Restrictions Per Provider Order: No     Mobility  Bed Mobility Overal bed mobility: Needs Assistance Bed Mobility: Supine to  Sit     Supine to sit: Max assist, +2 for physical assistance, +2 for safety/equipment     General bed mobility comments: Pt able to advance RLE to EOB, but requires assist for LLE and trunk    Transfers Overall transfer level: Needs assistance Equipment used: 2 person hand held assist Transfers: Sit to/from Stand Sit to Stand: Max assist, +2 physical assistance Stand pivot transfers: Max assist, +2 physical assistance         General transfer comment: STS with max A +2 for power up and L knee blocked. Max A +2 to pivot to recliner with pt's knees buckling requiring additional stand for repositioning    Ambulation/Gait                   Stairs             Wheelchair Mobility     Tilt Bed    Modified Rankin (Stroke Patients Only) Modified Rankin (Stroke Patients Only) Pre-Morbid Rankin Score: Moderate disability Modified Rankin: Severe disability     Balance Overall balance assessment: Needs assistance Sitting-balance support: Feet supported, Single extremity supported, No upper extremity supported Sitting balance-Leahy Scale: Poor Sitting balance - Comments: mod-max A for sitting balance due to L lateral lean Postural control: Left lateral lean Standing balance support: Bilateral upper extremity supported, During functional activity, Reliant on assistive device for balance Standing balance-Leahy Scale: Zero Standing balance comment: reliant on max A +2                            Communication Communication Communication: Impaired Factors Affecting Communication: Reduced clarity of speech  Cognition Arousal: Alert Behavior During Therapy: Flat affect   PT - Cognitive impairments: Attention, Problem solving, Safety/Judgement,  Initiation                         Following commands: Impaired Following commands impaired: Follows one step commands with increased time    Cueing Cueing Techniques: Verbal cues, Tactile cues   Exercises      General Comments        Pertinent Vitals/Pain Pain Assessment Pain Assessment: Faces Faces Pain Scale: Hurts a little bit Pain Location: all over Pain Descriptors / Indicators: Aching, Grimacing, Discomfort Pain Intervention(s): Limited activity within patient's tolerance, Monitored during session, Repositioned     PT Goals (current goals can now be found in the care plan section) Acute Rehab PT Goals Patient Stated Goal: to go to the beach with family PT Goal Formulation: With patient/family Time For Goal Achievement: 07/10/24 Progress towards PT goals: Progressing toward goals    Frequency    Min 2X/week       AM-PAC PT 6 Clicks Mobility   Outcome Measure  Help needed turning from your back to your side while in a flat bed without using bedrails?: A Lot Help needed moving from lying on your back to sitting on the side of a flat bed without using bedrails?: Total Help needed moving to and from a bed to a chair (including a wheelchair)?: Total Help needed standing up from a chair using your arms (e.g., wheelchair or bedside chair)?: Total Help needed to walk in hospital room?: Total Help needed climbing 3-5 steps with a railing? : Total 6 Click Score: 7    End of Session Equipment Utilized During Treatment: Gait belt Activity Tolerance: Patient limited by fatigue;Patient tolerated treatment well Patient left: with call bell/phone within reach;with family/visitor present;in chair Nurse Communication: Mobility status;Other (comment) (no chair alarm box in room) PT Visit Diagnosis: Unsteadiness on feet (R26.81);Hemiplegia and hemiparesis Hemiplegia - Right/Left: Left Hemiplegia - dominant/non-dominant: Non-dominant Hemiplegia - caused by: Cerebral infarction     Time: 9054-8985 PT Time Calculation (min) (ACUTE ONLY): 29 min  Charges:    $Therapeutic Activity: 23-37 mins PT General Charges $$ ACUTE PT VISIT: 1 Visit                      Darryle George, PTA Acute Rehabilitation Services Secure Chat Preferred  [phone]    Darryle George 07/07/2024, 12:38 PM

## 2024-07-07 NOTE — Progress Notes (Addendum)
 STROKE TEAM PROGRESS NOTE   SIGNIFICANT HOSPITAL EVENTS 7/5: Admitted for RUQ pain, nausea, vomiting E. Coli bacteremia treated with 5 days of ceftriaxone  7/9: 18:15 daughter witnessed patient with sudden onset slurred speech, left-sided weakness, and right gaze deviation for which a Code Stroke was activated - TNKase  administered (no contraindication per primary team and surgery) - CTH with right MCA M1 occlusion, taken to IR for thrombectomy with TICI 2B revacaularization - Remained intubated for airway protection and admitted to the ICU overnight   - Sedation paused this morning with some prolonged sedating effects  7/10:Extubated 1230 7/11: corTrak placed, started on ASA 7/12: Transferred out of ICU  INTERIM HISTORY/SUBJECTIVE Patient is drowsy, up in the chair. Does not remember if she was able to sleep last night.   Continue tramadol  as needed every 12 hours for back pain not controlled by flexeril  and tylenol .   CBC    Component Value Date/Time   WBC 6.9 07/07/2024 0450   RBC 3.32 (L) 07/07/2024 0450   HGB 10.3 (L) 07/07/2024 0450   HGB 13.2 05/05/2024 1343   HCT 31.7 (L) 07/07/2024 0450   PLT 275 07/07/2024 0450   PLT 150 05/05/2024 1343   MCV 95.5 07/07/2024 0450   MCH 31.0 07/07/2024 0450   MCHC 32.5 07/07/2024 0450   RDW 13.9 07/07/2024 0450   LYMPHSABS 0.8 06/26/2024 0530   MONOABS 0.9 06/26/2024 0530   EOSABS 0.2 06/26/2024 0530   BASOSABS 0.1 06/26/2024 0530   BMET    Component Value Date/Time   NA 136 07/07/2024 0450   K 4.7 07/07/2024 0450   CL 99 07/07/2024 0450   CO2 25 07/07/2024 0450   GLUCOSE 118 (H) 07/07/2024 0450   BUN 26 (H) 07/07/2024 0450   CREATININE 0.66 07/07/2024 0450   CREATININE 0.74 05/05/2024 1343   CALCIUM  8.9 07/07/2024 0450   GFRNONAA >60 07/07/2024 0450   GFRNONAA >60 05/05/2024 1343   Lab Results  Component Value Date   HGBA1C 5.1 06/26/2024    Lab Results  Component Value Date   CHOL 96 06/26/2024   HDL 35 (L)  06/26/2024   LDLCALC 46 06/26/2024   TRIG 74 06/26/2024   TRIG 74 06/26/2024   CHOLHDL 2.7 06/26/2024   Drugs of Abuse  No results found for: LABOPIA, COCAINSCRNUR, LABBENZ, AMPHETMU, THCU, LABBARB   Urinalysis    Component Value Date/Time   COLORURINE YELLOW 06/21/2024 1532   APPEARANCEUR CLEAR 06/21/2024 1532   LABSPEC 1.013 06/21/2024 1532   PHURINE 7.0 06/21/2024 1532   GLUCOSEU NEGATIVE 06/21/2024 1532   HGBUR MODERATE (A) 06/21/2024 1532   BILIRUBINUR NEGATIVE 06/21/2024 1532   KETONESUR NEGATIVE 06/21/2024 1532   PROTEINUR NEGATIVE 06/21/2024 1532   UROBILINOGEN 1.0 11/01/2007 1614   NITRITE POSITIVE (A) 06/21/2024 1532   LEUKOCYTESUR NEGATIVE 06/21/2024 1532   IMAGING past 24 hours No results found.  Vitals:   07/06/24 2031 07/06/24 2334 07/07/24 0627 07/07/24 0754  BP: (!) 125/54 (!) 133/55 (!) 123/43 (!) 123/37  Pulse: (!) 56 (!) 55 (!) 56 (!) 55  Resp:  11 18 19   Temp: 99.1 F (37.3 C) 98.4 F (36.9 C) 97.8 F (36.6 C) 98.7 F (37.1 C)  TempSrc: Oral Oral    SpO2: 99% 97% 98% 95%  Weight:      Height:       PHYSICAL EXAM General:  Critically ill appearing, elderly, Caucasian female laying in bed CV: Sinus bradycardia with rate in the 50's Respiratory: Room air GI: Abdomen  soft and nontender  NEURO:  Mental Status: awake and alert.  She is oriented to self, age, month situation and place. She can follow commands.   Cranial Nerves:  II: PERRL.  left hemianopsia, no blink to threat  III, IV, VI: Right gaze preference, will track towards midline but will not cross midline towards the left V: Does not respond to light touch on either side of the face VII: left facial droop  VIII: Hearing is intact to voice. IX, X: cough present  XI: Head is grossly midline XII: midline Motor: Left upper extremity is flaccid with withdrawal of the left lower extremity. Right arm and right leg spontaneous movement Sensation: decreased on left   Coordination: unable to assess Gait: Deferred  ASSESSMENT/PLAN Ms. CAIRA POCHE is a 89 y.o. female with history of COPD, PAD, HLD, OSA, right breast carcinoma stage Ia s/p lumpectomy, radiation, and hormone therapy in remission admitted initially on 7/5 with RUQ abdominal pain, nausea, vomiting with initial workup revealing elevated LFTs and possible cholecystitis/E. coli bacteremia s/p 5 days of ceftriaxone .  Hospital course complicated by hallucinations, encephalopathy, some difficulty breathing treated with diuretics. Patient developed a sudden onset of left-sided weakness with a right gaze deviation and slurred speech.  Stroke:  right MCA territory infarct with R M1 occlusion s/p TNK and IR with TICI 2B initially but persistent occlusion eventually, etiology:  most likely large vessel disease    Code Stroke CT head No CT evidence of acute intracranial abnormality. ASPECTS is 10. CTA head & neck Abrupt occlusion of the distal M1 segment of the right middle cerebral artery S/p IR with repeated  reocclusion secondary to underlying long segment atherosclerotic plaque and final score TICI2b Post IR CT small focal right perisylvian subarachnoid hemorrhage. MRI brain  Evolving right MCA territory infarct. Mild associated petechial blood products at the right frontoparietal region. No significant regional mass effect. 2. Small volume acute subarachnoid hemorrhage at the base of the right Sylvian fissure.   MRA Persistent and/or recurrent proximal right M2 occlusion, superior division.  2D Echo LVEF 60-65%, mild atrial dilation LDL 46 HgbA1c 5.1 VTE prophylaxis - heparin  subq aspirin  81 mg daily prior to admission, now on aspirin  81 mg daily given SAH. Will add plavix if no more procedure planned Therapy recommendations:  SNF vs CIR Disposition:  Pending  ? Hx of Stroke/TIA Acute onset of vertigo and gait disturbance in June 2018 with residual truncal ataxia requiring use of walker for  ambulation  Concern for possible brainstem/cerebellar stroke at that time  Recommended for MRI evaluation at that time but this was lost to follow up   Acute Acalculous Cholecystitis E. Coli bacteremia Elevated LFTs, resolved Admitted for right upper quadrant pain and nausea vomiting. 7/5 blood cultures + for E. Coli S/p Flagyl  S/p Cefadoxil  AST/ALT 1658/958 -> ...-> 25/57->37/60->25/39->31/33 Less likely endocarditis given the bacteria species  HTN Home meds:  None Stable Add PRN labetalol , hydralazine  for BP control  Blood pressure goal SBP <160 given SAH Long term BP goal: Normotension  Hyperlipidemia Home meds:  atorvastatin  10 mg LDL 46, goal < 70 Now on lipitor 10 No high intensity statin given LDL at goal and advanced age Consider statin on discharge  Dysphagia Patient has post-stroke dysphagia SLP consult Currently n.p.o. S/p cortrak  Continue TF @ 40 MBS in am  Other Stroke Risk Factors Advanced age Family hx stroke (possible stroke versus MI in patient's father) Remote tobacco use history 56-pack-year history, stopped smoking 8  years ago CAD/PAD Obstructive sleep apnea, on CPAP at home though reported not using effectively PTA  Other Active Problems Anemia, Hbg 13.8 on admission > 10.6 Depression on Lexapro  COPD Lower back pain, on Tylenol  and the Flexeril  as needed.  Will add tramadol  as needed too  Hospital day # 16   Patient seen and examined by NP/APP with MD. MD to update note as needed.   Jorene Last, DNP, FNP-BC [facility] Pager: [phone]  ATTENDING NOTE: I reviewed above note and agree with the assessment and plan. Pt was seen and examined.   Daughter at the bedside. Pt no acute event overnight. Neuro unchanged. Did not pass swallow and pending MBS tomorrow. Now on TF. CIR coordinator on board, pending CIR vs. SNF decision. Continue ASA and statin  For detailed assessment and plan, please refer to above as I have  made changes wherever appropriate.   Ary Cummins, MD PhD Stroke Neurology 07/07/2024 12:27 PM    To contact Stroke Continuity provider, please refer to [email or web address]. After hours, contact General Neurology

## 2024-07-07 NOTE — Discharge Summary (Incomplete)
 Stroke Discharge Summary  Patient ID: [name]   MRN: [id number]      DOB: 1932/11/11  Date of Admission: 06/21/2024 Date of Discharge: 07/08/2024  Attending Physician:  Jerri Pfeiffer MD Consultant(s):    pulmonary/intensive care and rehabilitation medicine  Patient's PCP:  [name], MD  DISCHARGE PRIMARY DIAGNOSIS:  right MCA territory infarct with R M1 occlusion s/p TNK and IR with TICI 2B initially but persistent occlusion eventually, etiology: most likely large vessel disease    Secondary diagnosis Hx of stroke Acute acalculous cholecystitis E Coli bacteremia  HTN HLD Dysphagia  CAD PAD OSA on CPAP COPD depression   Allergies as of 07/08/2024       Reactions   Adhesive [tape] Itching, Other (See Comments)   Little red itchy bumps develop   Ativan  [lorazepam ] Anxiety, Other (See Comments)   Family states makes her go crazy   Latex Itching, Other (See Comments)   Little red itchy bumps develop        Medication List     STOP taking these medications    Systane 0.4-0.3 % Soln Generic drug: Polyethyl Glycol-Propyl Glycol   Wellbutrin  XL 150 MG 24 hr tablet Generic drug: buPROPion        TAKE these medications    acetaminophen  650 MG CR tablet Commonly known as: TYLENOL  Take 650 mg by mouth 3 (three) times daily as needed for pain.   albuterol  108 (90 Base) MCG/ACT inhaler Commonly known as: VENTOLIN  HFA Inhale 2 puffs into the lungs every 6 (six) hours as needed for wheezing or shortness of breath.   artificial tears ophthalmic solution Place 1 drop into both eyes as needed for dry eyes.   aspirin  EC 81 MG tablet Take 81 mg by mouth daily.   atorvastatin  10 MG tablet Commonly known as: LIPITOR Take 10 mg by mouth at bedtime.   cyclobenzaprine  5 MG tablet Commonly known as: FLEXERIL  Place 1 tablet (5 mg total) into feeding tube every 8 (eight) hours as needed for muscle spasms.   diclofenac  Sodium 1 % Gel Commonly known as:  VOLTAREN  Apply 2 g topically 4 (four) times daily.   escitalopram  20 MG tablet Commonly known as: LEXAPRO  Take 40 mg by mouth daily.   feeding supplement (OSMOLITE 1.5 CAL) Liqd Place 1,000 mLs into feeding tube continuous.   feeding supplement (PROSource TF20) liquid Place 60 mLs into feeding tube daily. Start taking on: July 09, 2024   Gerhardt's butt cream Crea Apply 1 Application topically 3 (three) times daily.   lidocaine  5 % ointment Commonly known as: XYLOCAINE  Apply topically daily as needed (Apply to back for pain as needed).   melatonin 3 MG Tabs tablet Place 2 tablets (6 mg total) into feeding tube at bedtime as needed.   multivitamin with minerals Tabs tablet Take 1 tablet by mouth daily.   nitroGLYCERIN  0.4 MG SL tablet Commonly known as: NITROSTAT  Place 1 tablet (0.4 mg total) under the tongue every 5 (five) minutes as needed for chest pain.   pantoprazole  40 MG tablet Commonly known as: PROTONIX  Take 40 mg by mouth daily.   polyethylene glycol 17 g packet Commonly known as: MIRALAX  / GLYCOLAX  Place 17 g into feeding tube daily as needed for mild constipation.   tiotropium 18 MCG inhalation capsule Commonly known as: SPIRIVA Place 18 mcg into inhaler and inhale daily.   Vitamin D  50 MCG (2000 UT) tablet Take 4,000 Units by mouth daily.  Durable Medical Equipment  (From admission, onward)           Start     Ordered   06/25/24 1132  For home use only DME Bedside commode  Once       Question:  Patient needs a bedside commode to treat with the following condition  Answer:  Weakness   06/25/24 1131            LABORATORY STUDIES CBC    Component Value Date/Time   WBC 6.9 07/08/2024 1040   RBC 3.66 (L) 07/08/2024 1040   HGB 11.2 (L) 07/08/2024 1040   HGB 13.2 05/05/2024 1343   HCT 34.5 (L) 07/08/2024 1040   PLT 286 07/08/2024 1040   PLT 150 05/05/2024 1343   MCV 94.3 07/08/2024 1040   MCH 30.6 07/08/2024 1040    MCHC 32.5 07/08/2024 1040   RDW 14.1 07/08/2024 1040   LYMPHSABS 0.8 06/26/2024 0530   MONOABS 0.9 06/26/2024 0530   EOSABS 0.2 06/26/2024 0530   BASOSABS 0.1 06/26/2024 0530   CMP    Component Value Date/Time   NA 138 07/08/2024 1040   K 4.6 07/08/2024 1040   CL 99 07/08/2024 1040   CO2 27 07/08/2024 1040   GLUCOSE 127 (H) 07/08/2024 1040   BUN 22 07/08/2024 1040   CREATININE 0.71 07/08/2024 1040   CREATININE 0.74 05/05/2024 1343   CALCIUM  9.4 07/08/2024 1040   PROT 5.6 (L) 07/05/2024 0423   ALBUMIN 2.6 (L) 07/05/2024 0423   AST 31 07/05/2024 0423   AST 15 05/05/2024 1343   ALT 33 07/05/2024 0423   ALT 11 05/05/2024 1343   ALKPHOS 65 07/05/2024 0423   BILITOT 0.6 07/05/2024 0423   BILITOT 0.5 05/05/2024 1343   GFRNONAA >60 07/08/2024 1040   GFRNONAA >60 05/05/2024 1343   GFRAA >60 09/08/2020 1233   COAGSNo results found for: INR, PROTIME Lipid Panel    Component Value Date/Time   CHOL 96 06/26/2024 0530   TRIG 74 06/26/2024 0530   TRIG 74 06/26/2024 0530   HDL 35 (L) 06/26/2024 0530   CHOLHDL 2.7 06/26/2024 0530   VLDL 15 06/26/2024 0530   LDLCALC 46 06/26/2024 0530   HgbA1C  Lab Results  Component Value Date   HGBA1C 5.1 06/26/2024   Alcohol  Level No results found for: [facility]   SIGNIFICANT DIAGNOSTIC STUDIES DG Swallowing Func-Speech Pathology Result Date: 07/08/2024 Table formatting from the original result was not included. Modified Barium Swallow Study Patient Details Name: [name] MRN: [id number] Date of Birth: [birth year] Today's Date: 07/08/2024 HPI/PMH: HPI: [name] is an 89 yo female presenting to ED 7/5 for RUQ pain, nausea/vomiting. Found to have suspected cholecystitis vs passed choledocholithiasis and acute liver injury, hyponatremia, bacteriuria, and hematuria. Initially seen at [facility] ED 7/3 with n/v and elevated LFTs but left without being seen. Daughter noted sudden onset AMS and L sided weakness 7/9 with CTA showing abrupt occlusion  of distal R MCA M1 s/p TNK. Taken to IR with unsuccessful revascularization (repeated reocclusion), post-procedure CTH showed small SAH. Remained intubated after the procedure, 7/9-7/10. PMH includes HLD, PAD, COPD with emphysema, OSA, R breast cancer, back pain, esophageal spasm Clinical Impression: Clinical Impression: Pt exhibits severe oropharyngeal dysphagia, which is considered slightly worse compared to previous MBS 7/11 but is suspected to primarily be impacted by the amount of trials observed today. She was alert and participatory but delays related to initiation continue to result in gross aspiration of thin, nectar, and honey  thick liquids most notably after the swallow secondary to reduced control of oral residuals as they progress through the pharynx. While she senses all aspiration and attempts to cough forcefully, it is ineffective at clearing the entire volume of the aspirated bolus (PAS 7). She was able to follow commands to attempt compensatory strategies but a chin tuck posture was ineffective with both thin and nectar thick liquids. Biofeedback was beneficial in cueing her to use a more timely subswallow but this cannot consistently be utilized clinically. There was less residue with purees and no penetration/aspiration. Recommend she remain NPO except ice chips after oral care. Plan to f/u to provide education to pt and her daughters. Will continue following to target ESMT and use of prompt subswallows with trials of puree in an effort to make this an effective way to prevent aspiration with liquids. Factors that may increase risk of adverse event in presence of aspiration [name] & [name] 2021): Factors that may increase risk of adverse event in presence of aspiration [name] & [name] 2021): Reduced cognitive function; Limited mobility; Presence of tubes (ETT, trach, NG, etc.); Frequent aspiration of large volumes Recommendations/Plan: Swallowing Evaluation Recommendations Swallowing  Evaluation Recommendations Recommendations: NPO; Ice chips PRN after oral care Medication Administration: Via alternative means Oral care recommendations: Oral care QID (4x/day); Oral care before ice chips/water  Recommended consults: Consider Palliative care Treatment Plan Treatment Plan Treatment recommendations: Therapy as outlined in treatment plan below Follow-up recommendations: Acute inpatient rehab (3 hours/day) Functional status assessment: Patient has had a recent decline in their functional status and demonstrates the ability to make significant improvements in function in a reasonable and predictable amount of time. Treatment frequency: Min 2x/week Treatment duration: 2 weeks Interventions: Aspiration precaution training; Oropharyngeal exercises; Compensatory techniques; Patient/family education; Trials of upgraded texture/liquids Recommendations Recommendations for follow up therapy are one component of a multi-disciplinary discharge planning process, led by the attending physician.  Recommendations may be updated based on patient status, additional functional criteria and insurance authorization. Assessment: Orofacial Exam: Orofacial Exam Oral Cavity: Oral Hygiene: WFL Oral Cavity - Dentition: Dentures, not available; Poor condition; Missing dentition Orofacial Anatomy: WFL Oral Motor/Sensory Function: Suspected cranial nerve impairment CN V - Trigeminal: WFL CN VII - Facial: Left motor impairment CN IX - Glossopharyngeal, CN X - Vagus: Left motor impairment CN XII - Hypoglossal: Left motor impairment Anatomy: Anatomy: Suspected cervical osteophytes; Prominent cricopharyngeus Boluses Administered: Boluses Administered Boluses Administered: Thin liquids (Level 0); Mildly thick liquids (Level 2, nectar thick); Moderately thick liquids (Level 3, honey thick); Puree  Oral Impairment Domain: Oral Impairment Domain Lip Closure: Escape progressing to mid-chin Tongue control during bolus hold: Posterior escape  of less than half of bolus Bolus transport/lingual motion: Slow tongue motion Oral residue: Residue collection on oral structures Location of oral residue : Tongue; Palate Initiation of pharyngeal swallow : Pyriform sinuses  Pharyngeal Impairment Domain: Pharyngeal Impairment Domain Soft palate elevation: No bolus between soft palate (SP)/pharyngeal wall (PW) Laryngeal elevation: Complete superior movement of thyroid  cartilage with complete approximation of arytenoids to epiglottic petiole Anterior hyoid excursion: Complete anterior movement Epiglottic movement: Partial inversion Laryngeal vestibule closure: Complete, no air/contrast in laryngeal vestibule Pharyngeal stripping wave : Present - complete Pharyngeal contraction (A/P view only): N/A Pharyngoesophageal segment opening: Partial distention/partial duration, partial obstruction of flow Tongue base retraction: Narrow column of contrast or air between tongue base and PPW Pharyngeal residue: Collection of residue within or on pharyngeal structures Location of pharyngeal residue: Tongue base; Valleculae; Pyriform sinuses  Esophageal Impairment  Domain: No data recorded Pill: No data recorded Penetration/Aspiration Scale Score: Penetration/Aspiration Scale Score 1.  Material does not enter airway: Puree 7.  Material enters airway, passes BELOW cords and not ejected out despite cough attempt by patient: Thin liquids (Level 0); Mildly thick liquids (Level 2, nectar thick); Moderately thick liquids (Level 3, honey thick) Compensatory Strategies: Compensatory Strategies Compensatory strategies: Yes Chin tuck: Ineffective Ineffective Chin Tuck: Thin liquid (Level 0); Mildly thick liquid (Level 2, nectar thick)   General Information: Caregiver present: No  Diet Prior to this Study: NPO; Cortrak/Small bore NG tube   Temperature : Normal   Respiratory Status: WFL   Supplemental O2: None (Room air)   History of Recent Intubation: Yes  Behavior/Cognition: Alert;  Cooperative; Requires cueing Self-Feeding Abilities: Needs assist with self-feeding Baseline vocal quality/speech: Dysphonic Volitional Cough: Able to elicit Volitional Swallow: Able to elicit Exam Limitations: No limitations Goal Planning: Prognosis for improved oropharyngeal function: Fair Barriers to Reach Goals: Cognitive deficits; Severity of deficits No data recorded Patient/Family Stated Goal: continued improvement Consulted and agree with results and recommendations: Patient; Physician Pain: Pain Assessment Pain Assessment: Faces Faces Pain Scale: 4 Pain Location: all over Pain Descriptors / Indicators: Aching; Grimacing; Discomfort Pain Intervention(s): Monitored during session End of Session: Start Time:SLP Start Time (ACUTE ONLY): 0957 Stop Time: SLP Stop Time (ACUTE ONLY): 1021 Time Calculation:SLP Time Calculation (min) (ACUTE ONLY): 24 min Charges: SLP Evaluations $ SLP Speech Visit: 1 Visit SLP Evaluations $MBS Swallow: 1 Procedure $Swallowing Treatment: 1 Procedure $Speech Treatment for Individual: 1 Procedure SLP visit diagnosis: SLP Visit Diagnosis: Dysphagia, oropharyngeal phase (R13.12) Past Medical History: Past Medical History: Diagnosis Date  Anxiety   Back pain   Cancer (HCC)   Depression   Emphysema   Esophageal spasm   Lung nodule   OSA (obstructive sleep apnea)   Osteomalacia   Personal history of radiation therapy   Vertigo   Vitamin D  deficiency  Past Surgical History: Past Surgical History: Procedure Laterality Date  ABDOMINAL HYSTERECTOMY    APPENDECTOMY    BREAST BIOPSY Right 08/31/2020  BREAST EXCISIONAL BIOPSY Left 1978  BREAST LUMPECTOMY Right 09/30/2020  BREAST LUMPECTOMY WITH RADIOACTIVE SEED LOCALIZATION Right 09/30/2020  Procedure: RIGHT BREAST LUMPECTOMY WITH RADIOACTIVE SEED LOCALIZATION;  Surgeon: Curvin Deward MOULD, MD;  Location: [facility];  Service: General;  Laterality: Right;  CARPAL TUNNEL RELEASE    IR CT HEAD LTD  06/25/2024  IR PERCUTANEOUS ART  THROMBECTOMY/INFUSION INTRACRANIAL INC DIAG ANGIO  06/25/2024  RADIOLOGY WITH ANESTHESIA N/A 06/25/2024  Procedure: RADIOLOGY WITH ANESTHESIA;  Surgeon: Dolphus Carrion, MD;  Location: [facility];  Service: Radiology;  Laterality: N/A;  ROTATOR CUFF REPAIR   Damien Blumenthal, M.A., CCC-SLP Speech Language Pathology, Acute Rehabilitation Services Secure Chat preferred [phone] 07/08/2024, 11:13 AM  DG Swallowing Func-Speech Pathology Result Date: 06/27/2024 Table formatting from the original result was not included. Modified Barium Swallow Study Patient Details Name: [name] MRN: [id number] Date of Birth: [birth year] Today's Date: 06/27/2024 HPI/PMH: HPI: [name] is an 89 yo female presenting to ED 7/5 for RUQ pain, nausea/vomiting. Found to have suspected cholecystitis vs passed choledocholithiasis and acute liver injury, hyponatremia, bacteriuria, and hematuria. Initially seen at [facility] ED 7/3 with n/v and elevated LFTs but left without being seen. Daughter noted sudden onset AMS and L sided weakness 7/9 with CTA showing abrupt occlusion of distal R MCA M1 s/p TNK. Taken to IR with unsuccessful revascularization (repeated reocclusion), post-procedure CTH showed small SAH. Remained intubated  after the procedure, 7/9-7/10. PMH includes HLD, PAD, COPD with emphysema, OSA, R breast cancer, back pain, esophageal spasm Clinical Impression: Clinical Impression: Pt exhibits moderate oropharyngeal dyspahgia characterized by mistiming that may also be impacted by chronic factors including suspected cervical osteophytes and a prominent cricopharyngeus. She often achieves complete laryngeal elevation evidenced in the instances of swallow initiation occurring as the bolus reaches her vocal folds and is effectively expelled but this is inconsistent. Thin and nectar thick liquids are ultimately aspirated, and although she is sensate, her cough is not forceful enough to eject aspirates (PAS 7). Oral transit and lingual motion  are slow with all consistencies, but most noticeably with honey thick liquids and purees as they inch through the mouth and pharynx, past the open laryngeal vestibule until she initiates a swallow at the level of the pyriform sinues. Dempsey penetration occurs with honey thick liquids occurs and goes unsensed (PAS 5). Residue consistently collects in the oral cavity and she cannot consistently follow commands to swallow again. The amount of cueing required to initiate use of compensatory strategies makes them ineffective. In addition to these impairments, her mentation is a significant barrier to having POs at this time. Recommend NPO except ice chips in moderation after oral care. SLP will continue following. Factors that may increase risk of adverse event in presence of aspiration [name] & [name] 2021): Factors that may increase risk of adverse event in presence of aspiration [name] & [name] 2021): Reduced cognitive function; Limited mobility; Presence of tubes (ETT, trach, NG, etc.); Frequent aspiration of large volumes Recommendations/Plan: Swallowing Evaluation Recommendations Swallowing Evaluation Recommendations Recommendations: NPO; Ice chips PRN after oral care Medication Administration: Via alternative means Oral care recommendations: Oral care QID (4x/day); Oral care before ice chips/water  Treatment Plan Treatment Plan Treatment recommendations: Therapy as outlined in treatment plan below Follow-up recommendations: Skilled nursing-short term rehab (<3 hours/day) Functional status assessment: Patient has had a recent decline in their functional status and demonstrates the ability to make significant improvements in function in a reasonable and predictable amount of time. Treatment frequency: Min 2x/week Treatment duration: 2 weeks Interventions: Aspiration precaution training; Oropharyngeal exercises; Compensatory techniques; Patient/family education; Trials of upgraded texture/liquids Recommendations  Recommendations for follow up therapy are one component of a multi-disciplinary discharge planning process, led by the attending physician.  Recommendations may be updated based on patient status, additional functional criteria and insurance authorization. Assessment: Orofacial Exam: Orofacial Exam Oral Cavity: Oral Hygiene: WFL Oral Cavity - Dentition: Dentures, not available; Poor condition; Missing dentition Orofacial Anatomy: WFL Oral Motor/Sensory Function: Suspected cranial nerve impairment CN V - Trigeminal: WFL CN VII - Facial: Left motor impairment CN IX - Glossopharyngeal, CN X - Vagus: Left motor impairment CN XII - Hypoglossal: Left motor impairment Anatomy: Anatomy: Suspected cervical osteophytes; Prominent cricopharyngeus Boluses Administered: Boluses Administered Boluses Administered: Thin liquids (Level 0); Mildly thick liquids (Level 2, nectar thick); Moderately thick liquids (Level 3, honey thick); Puree  Oral Impairment Domain: Oral Impairment Domain Lip Closure: Escape progressing to mid-chin Tongue control during bolus hold: Not tested Bolus transport/lingual motion: Slow tongue motion Oral residue: Residue collection on oral structures Location of oral residue : Tongue; Palate Initiation of pharyngeal swallow : Pyriform sinuses  Pharyngeal Impairment Domain: Pharyngeal Impairment Domain Soft palate elevation: No bolus between soft palate (SP)/pharyngeal wall (PW) Laryngeal elevation: Complete superior movement of thyroid  cartilage with complete approximation of arytenoids to epiglottic petiole Anterior hyoid excursion: Complete anterior movement Epiglottic movement: Partial inversion Laryngeal vestibule closure: Complete, no air/contrast  in laryngeal vestibule Pharyngeal stripping wave : Present - complete Pharyngeal contraction (A/P view only): N/A Pharyngoesophageal segment opening: Partial distention/partial duration, partial obstruction of flow Tongue base retraction: Narrow column of  contrast or air between tongue base and PPW Pharyngeal residue: Collection of residue within or on pharyngeal structures Location of pharyngeal residue: Tongue base; Valleculae; Pyriform sinuses  Esophageal Impairment Domain: No data recorded Pill: No data recorded Penetration/Aspiration Scale Score: Penetration/Aspiration Scale Score 1.  Material does not enter airway: Puree 5.  Material enters airway, CONTACTS cords and not ejected out: Moderately thick liquids (Level 3, honey thick) 7.  Material enters airway, passes BELOW cords and not ejected out despite cough attempt by patient: Thin liquids (Level 0); Mildly thick liquids (Level 2, nectar thick) Compensatory Strategies: Compensatory Strategies Compensatory strategies: Yes Chin tuck: Ineffective Ineffective Chin Tuck: Thin liquid (Level 0)   General Information: Caregiver present: No  Diet Prior to this Study: NPO; Cortrak/Small bore NG tube   Temperature : Normal   Respiratory Status: WFL   Supplemental O2: None (Room air)   History of Recent Intubation: Yes  Behavior/Cognition: Alert; Cooperative; Requires cueing Self-Feeding Abilities: Needs assist with self-feeding Baseline vocal quality/speech: Dysphonic Volitional Cough: Able to elicit Volitional Swallow: Able to elicit Exam Limitations: No limitations Goal Planning: Prognosis for improved oropharyngeal function: Good Barriers to Reach Goals: Cognitive deficits; Severity of deficits No data recorded Patient/Family Stated Goal: wants to sleep Consulted and agree with results and recommendations: Patient; Nurse Pain: Pain Assessment Pain Assessment: Faces Faces Pain Scale: 2 Facial Expression: 0 Body Movements: 0 Muscle Tension: 0 Compliance with ventilator (intubated pts.): 0 Vocalization (extubated pts.): N/A CPOT Total: 0 Pain Location: legs Pain Descriptors / Indicators: Grimacing; Discomfort Pain Intervention(s): Monitored during session End of Session: Start Time:SLP Start Time (ACUTE ONLY): 1318  Stop Time: SLP Stop Time (ACUTE ONLY): 1340 Time Calculation:SLP Time Calculation (min) (ACUTE ONLY): 22 min Charges: SLP Evaluations $ SLP Speech Visit: 1 Visit SLP Evaluations $BSS Swallow: 1 Procedure $MBS Swallow: 1 Procedure $ SLP EVAL LANGUAGE/SOUND PRODUCTION: 1 Procedure SLP visit diagnosis: SLP Visit Diagnosis: Dysphagia, oropharyngeal phase (R13.12) Past Medical History: Past Medical History: Diagnosis Date  Anxiety   Back pain   Cancer (HCC)   Depression   Emphysema   Esophageal spasm   Lung nodule   OSA (obstructive sleep apnea)   Osteomalacia   Personal history of radiation therapy   Vertigo   Vitamin D  deficiency  Past Surgical History: Past Surgical History: Procedure Laterality Date  ABDOMINAL HYSTERECTOMY    APPENDECTOMY    BREAST BIOPSY Right 08/31/2020  BREAST EXCISIONAL BIOPSY Left 1978  BREAST LUMPECTOMY Right 09/30/2020  BREAST LUMPECTOMY WITH RADIOACTIVE SEED LOCALIZATION Right 09/30/2020  Procedure: RIGHT BREAST LUMPECTOMY WITH RADIOACTIVE SEED LOCALIZATION;  Surgeon: Curvin Deward MOULD, MD;  Location: [facility];  Service: General;  Laterality: Right;  CARPAL TUNNEL RELEASE    IR CT HEAD LTD  06/25/2024  IR PERCUTANEOUS ART THROMBECTOMY/INFUSION INTRACRANIAL INC DIAG ANGIO  06/25/2024  RADIOLOGY WITH ANESTHESIA N/A 06/25/2024  Procedure: RADIOLOGY WITH ANESTHESIA;  Surgeon: Dolphus Carrion, MD;  Location: [facility];  Service: Radiology;  Laterality: N/A;  ROTATOR CUFF REPAIR   Damien Blumenthal, M.A., CCC-SLP Speech Language Pathology, Acute Rehabilitation Services Secure Chat preferred [phone] 06/27/2024, 2:32 PM  IR PERCUTANEOUS ART THROMBECTOMY/INFUSION INTRACRANIAL INC DIAG ANGIO Result Date: 06/27/2024 INDICATION: New onset right gaze deviation and left-sided weakness. Occluded right middle cerebral artery just distal to the origin of the non dominant  inferior division. EXAM: 1. EMERGENT LARGE VESSEL OCCLUSION THROMBOLYSIS (anterior CIRCULATION) COMPARISON:  CT angiogram of the  head and neck of June 25, 2024. MEDICATIONS: No antibiotic was administered within 1 hour of the procedure. ANESTHESIA/SEDATION: General anesthesia. CONTRAST:  Omnipaque  300 approximately 110 mL. FLUOROSCOPY TIME:  Fluoroscopy Time: 58 minutes 6 seconds (1989 mGy). COMPLICATIONS: None immediate. TECHNIQUE: Following a full explanation of the procedure along with the potential associated complications, an informed witnessed consent was obtained. The risks of intracranial hemorrhage of 10%, worsening neurological deficit, ventilator dependency, death and inability to revascularize were all reviewed in detail with the patient's daughter. The patient was then put under general anesthesia by the Department of Anesthesiology at [facility]. The right groin was prepped and draped in the usual sterile fashion. Thereafter using modified Seldinger technique, transfemoral access into the right common femoral artery was obtained without difficulty. Over an 0.035 inch guidewire an 8 French 25 cm pinnacle sheath was inserted. Through this, and also over an 0.035 inch guidewire a combination of an 088 cm Zoom support catheter with a 125 cm 6 French Simmons 2 catheter was advanced to the aortic arch region and positioned in the right common carotid artery and then the distal right internal carotid artery. FINDINGS: The right common carotid arteriogram demonstrates the right external carotid artery and its major branches to be widely patent. The right internal carotid artery at the bulb demonstrates a mild smooth irregularity. More distally, the vessel is seen to opacify to the cranial skull base with moderate to severe tortuosity of the mid cervical right ICA. The distal right ICA, the petrous, the cavernous and the supraclinoid ICA demonstrate wide patency. The right anterior cerebral artery opacifies into the capillary and venous phases. Prompt cross-filling via the anterior communicating artery of the left anterior  cerebral artery A2 segment also noted. The right middle cerebral artery demonstrates complete occlusion just distal to the origin of the non dominant inferior division. The anterior temporal branch remains widely patent. Florid lenticular striate vasculature is noted. PROCEDURE: Through the Zoom support catheter in the petrous right ICA, a combination of an 068 aspiration catheter with an 043 distal aspiration catheter was advanced over an 018 inch Aristotle micro guidewire with a moderate J configuration to the distal right middle cerebral artery. The guidewire was then advanced with moderate resistance followed by the 043 and then the 068 aspiration catheters. The 043 aspiration catheter was removed as was the micro guidewire. The 068 was engaged into the occluded dominant superior division proximally. Following 4 minutes of aspiration, the 068 aspiration catheter was removed. A control arteriogram performed through the Zoom support catheter demonstrated no change in the occluded dominant superior division. A second pass was made again using the above combination. The 068 aspiration catheter was advanced and imbedded in the dominant superior division proximally after removal of the 043 distal aspiration catheter. Following 3 minutes of aspiration, the 068 catheter was removed. A control arteriogram performed through the Zoom support catheter again demonstrated no change in the occluded superior division. A third pass was then made into this time using an 021 162 cm microcatheter in combination with an 071 134 cm Zoom aspiration catheter advanced over an 014 inch standard Aristotle micro guidewire to the distal right M1 segment. The micro guidewire was then advanced with mild resistance into an inferior division branch followed by the microcatheter. The guidewire was removed. After having confirmed good aspiration from the microcatheter, a 4 mm x 40 mm  Solitaire X retrieval device was then deployed in the usual  manner. The 071 Zoom aspiration catheter was advanced and engaged into the occluded dominant superior division. Following 3 minutes of aspiration, the combination of the retrieval device, the microcatheter and the 071 Zoom aspiration catheter was removed. Control arteriogram performed through the Zoom support catheter demonstrated improved distal opacification of the dominant superior division branch with a notable prominent filling defect in the more proximal part of the superior division representing an atherosclerotic plaque. A fourth pass was then made using an 055 Zoom aspiration catheter advanced with an 021 micro guidewire combination again over an 014 inch standard external micro guidewire. At this time, access was obtained in the more anterior branch of the superior division with the microcatheter. The 4 mm x 40 mm Solitaire X retrieval device was then deployed in the usual manner after having confirmed safe positioning of the tip of the microcatheter. The 055 Zoom aspiration catheter was then advanced more distally into the superior division. After 2 minutes of aspiration, the combination of the retrieval device and the 055 Zoom aspiration catheter was removed. A distal control arteriogram performed demonstrated opacification of attenuated distal branches of the superior division again with a more prominent smudgy filling defect proximally. A fifth pass was made using an 068 aspiration catheter advanced with an 021 micro guidewire over an 014 inch Aristotle micro guidewire with a moderate J configuration. Access into the M2 M3 region of the superior division was obtained followed by the microcatheter. The 068 aspiration catheter was advanced into the superior division. No contrast aspiration was obtained from the tip of the microcatheter which was then gradually retrieved until free aspiration was obtained. Following 2 minutes of aspiration through the aspiration catheter, this was removed. A control  arteriogram performed through the Zoom support catheter demonstrated patent superior division with slow flow distally into the M2 M3 branches, which continued to remain attenuated as before. A flat panel CT of the brain was performed contemplating rescue stenting. This revealed a small right perisylvian subarachnoid hemorrhage. No gross mass effect was evident. Following discussion with the neuro hospitalist, it was elected not to proceed with the rescue stenting given increase risk of intracranial hemorrhage with use of dual antiplatelets to protect the stent from occluding, and also with the patient having received TNK and the small caliber of the branches of the superior division proximally. The procedure was stopped. A final control arteriogram performed through the right common carotid artery continued to demonstrate patency of the right internal carotid artery proximally and distally with no change in the right MCA TICI 2B revascularization. Manual compression with quick clot was performed for hemostasis at the right groin puncture site. Distal pulses remained present unchanged from prior to the procedure. The patient was left intubated due to her medical condition. She was a transported in stable hemodynamic and neurological condition to the neuro ICU for post revascularization care. IMPRESSION: Status post endovascular revascularization of right middle cerebral artery dominant superior division with 3 passes of contact aspiration, and 2 passes with a 4 mm x 40 mm Solitaire X retrieval device, and contact aspiration achieving a TICI 2B revascularization. PLAN: As per referring MD. Electronically Signed   By: Thyra Nash M.D.   On: 06/27/2024 08:29   IR CT Head Ltd Result Date: 06/27/2024 INDICATION: New onset right gaze deviation and left-sided weakness. Occluded right middle cerebral artery just distal to the origin of the non dominant inferior division. EXAM: 1. EMERGENT  LARGE VESSEL OCCLUSION  THROMBOLYSIS (anterior CIRCULATION) COMPARISON:  CT angiogram of the head and neck of June 25, 2024. MEDICATIONS: No antibiotic was administered within 1 hour of the procedure. ANESTHESIA/SEDATION: General anesthesia. CONTRAST:  Omnipaque  300 approximately 110 mL. FLUOROSCOPY TIME:  Fluoroscopy Time: 58 minutes 6 seconds (1989 mGy). COMPLICATIONS: None immediate. TECHNIQUE: Following a full explanation of the procedure along with the potential associated complications, an informed witnessed consent was obtained. The risks of intracranial hemorrhage of 10%, worsening neurological deficit, ventilator dependency, death and inability to revascularize were all reviewed in detail with the patient's daughter. The patient was then put under general anesthesia by the Department of Anesthesiology at [facility]. The right groin was prepped and draped in the usual sterile fashion. Thereafter using modified Seldinger technique, transfemoral access into the right common femoral artery was obtained without difficulty. Over an 0.035 inch guidewire an 8 French 25 cm pinnacle sheath was inserted. Through this, and also over an 0.035 inch guidewire a combination of an 088 cm Zoom support catheter with a 125 cm 6 French Simmons 2 catheter was advanced to the aortic arch region and positioned in the right common carotid artery and then the distal right internal carotid artery. FINDINGS: The right common carotid arteriogram demonstrates the right external carotid artery and its major branches to be widely patent. The right internal carotid artery at the bulb demonstrates a mild smooth irregularity. More distally, the vessel is seen to opacify to the cranial skull base with moderate to severe tortuosity of the mid cervical right ICA. The distal right ICA, the petrous, the cavernous and the supraclinoid ICA demonstrate wide patency. The right anterior cerebral artery opacifies into the capillary and venous phases. Prompt  cross-filling via the anterior communicating artery of the left anterior cerebral artery A2 segment also noted. The right middle cerebral artery demonstrates complete occlusion just distal to the origin of the non dominant inferior division. The anterior temporal branch remains widely patent. Florid lenticular striate vasculature is noted. PROCEDURE: Through the Zoom support catheter in the petrous right ICA, a combination of an 068 aspiration catheter with an 043 distal aspiration catheter was advanced over an 018 inch Aristotle micro guidewire with a moderate J configuration to the distal right middle cerebral artery. The guidewire was then advanced with moderate resistance followed by the 043 and then the 068 aspiration catheters. The 043 aspiration catheter was removed as was the micro guidewire. The 068 was engaged into the occluded dominant superior division proximally. Following 4 minutes of aspiration, the 068 aspiration catheter was removed. A control arteriogram performed through the Zoom support catheter demonstrated no change in the occluded dominant superior division. A second pass was made again using the above combination. The 068 aspiration catheter was advanced and imbedded in the dominant superior division proximally after removal of the 043 distal aspiration catheter. Following 3 minutes of aspiration, the 068 catheter was removed. A control arteriogram performed through the Zoom support catheter again demonstrated no change in the occluded superior division. A third pass was then made into this time using an 021 162 cm microcatheter in combination with an 071 134 cm Zoom aspiration catheter advanced over an 014 inch standard Aristotle micro guidewire to the distal right M1 segment. The micro guidewire was then advanced with mild resistance into an inferior division branch followed by the microcatheter. The guidewire was removed. After having confirmed good aspiration from the microcatheter, a 4  mm x 40 mm Solitaire X retrieval device  was then deployed in the usual manner. The 071 Zoom aspiration catheter was advanced and engaged into the occluded dominant superior division. Following 3 minutes of aspiration, the combination of the retrieval device, the microcatheter and the 071 Zoom aspiration catheter was removed. Control arteriogram performed through the Zoom support catheter demonstrated improved distal opacification of the dominant superior division branch with a notable prominent filling defect in the more proximal part of the superior division representing an atherosclerotic plaque. A fourth pass was then made using an 055 Zoom aspiration catheter advanced with an 021 micro guidewire combination again over an 014 inch standard external micro guidewire. At this time, access was obtained in the more anterior branch of the superior division with the microcatheter. The 4 mm x 40 mm Solitaire X retrieval device was then deployed in the usual manner after having confirmed safe positioning of the tip of the microcatheter. The 055 Zoom aspiration catheter was then advanced more distally into the superior division. After 2 minutes of aspiration, the combination of the retrieval device and the 055 Zoom aspiration catheter was removed. A distal control arteriogram performed demonstrated opacification of attenuated distal branches of the superior division again with a more prominent smudgy filling defect proximally. A fifth pass was made using an 068 aspiration catheter advanced with an 021 micro guidewire over an 014 inch Aristotle micro guidewire with a moderate J configuration. Access into the M2 M3 region of the superior division was obtained followed by the microcatheter. The 068 aspiration catheter was advanced into the superior division. No contrast aspiration was obtained from the tip of the microcatheter which was then gradually retrieved until free aspiration was obtained. Following 2 minutes of  aspiration through the aspiration catheter, this was removed. A control arteriogram performed through the Zoom support catheter demonstrated patent superior division with slow flow distally into the M2 M3 branches, which continued to remain attenuated as before. A flat panel CT of the brain was performed contemplating rescue stenting. This revealed a small right perisylvian subarachnoid hemorrhage. No gross mass effect was evident. Following discussion with the neuro hospitalist, it was elected not to proceed with the rescue stenting given increase risk of intracranial hemorrhage with use of dual antiplatelets to protect the stent from occluding, and also with the patient having received TNK and the small caliber of the branches of the superior division proximally. The procedure was stopped. A final control arteriogram performed through the right common carotid artery continued to demonstrate patency of the right internal carotid artery proximally and distally with no change in the right MCA TICI 2B revascularization. Manual compression with quick clot was performed for hemostasis at the right groin puncture site. Distal pulses remained present unchanged from prior to the procedure. The patient was left intubated due to her medical condition. She was a transported in stable hemodynamic and neurological condition to the neuro ICU for post revascularization care. IMPRESSION: Status post endovascular revascularization of right middle cerebral artery dominant superior division with 3 passes of contact aspiration, and 2 passes with a 4 mm x 40 mm Solitaire X retrieval device, and contact aspiration achieving a TICI 2B revascularization. PLAN: As per referring MD. Electronically Signed   By: Thyra Nash M.D.   On: 06/27/2024 08:29   MR ANGIO HEAD WO CONTRAST Result Date: 06/27/2024 CLINICAL DATA:  Follow-up examination for acute stroke, status post revascularization. EXAM: MRI HEAD WITHOUT CONTRAST MRA HEAD  WITHOUT CONTRAST TECHNIQUE: Multiplanar, multi-echo pulse sequences of the brain and surrounding structures  were acquired without intravenous contrast. Angiographic images of the Circle of Willis were acquired using MRA technique without intravenous contrast. COMPARISON:  Comparison made with prior studies from 06/25/2024 FINDINGS: MRI HEAD FINDINGS Brain: Examination mildly degraded by motion artifact. Generalized age-related cerebral atrophy. Patchy and confluent T2/FLAIR hyperintensity involving the periventricular deep white matter both cerebral hemispheres, consistent with chronic small vessel ischemic disease, moderately advanced in nature. Patchy and confluent restricted diffusion involving the right cerebral hemisphere, with involvement of the right frontal, parietal, and temporal lobes, consistent with an evolving right MCA territory infarct. Mild associated petechial blood products near the right frontoparietal region (series 29, image 32). Additionally, susceptibility artifact at the base of the sylvian fissure consistent with small volume acute subarachnoid hemorrhage (series 29, image 29). This was also seen on intra procedural head CT. No significant regional mass effect. No other evidence for acute or subacute ischemia. No other areas of chronic cortical infarction. No other acute or chronic intracranial blood products. No mass lesion or midline shift. No hydrocephalus or extra-axial fluid collection. Pituitary gland within normal limits. Vascular: Major intracranial vascular flow voids are maintained. Skull and upper cervical spine: Craniocervical junction within normal limits. Bone marrow signal intensity within normal limits. No scalp soft tissue abnormality. Sinuses/Orbits: Right gaze preference noted. Prior bilateral ocular lens replacement. Paranasal sinuses are largely clear. Trace bilateral mastoid effusions noted of doubtful significance. Other: None. MRA HEAD FINDINGS Anterior circulation:  Examination mildly degraded by motion artifact. Both internal carotid arteries remain patent through the siphons without stenosis or other abnormality. A1 segments, anterior communicating artery complex, and anterior cerebral arteries patent without stenosis. Left M1 segment and distal left MCA branches are widely patent and well perfused. Right M1 segment widely patent proximally, but becomes increasing attenuated as it courses towards the bifurcation. There is persistent and/or recurrent proximal right M2 occlusion, superior division (series 8, image 125). Overall appearance is similar to prior CTA. Remainder of the right MCA branches are somewhat attenuated but remain patent. Distal small vessel atheromatous irregularity noted. Posterior circulation: Right vertebral artery dominant and widely patent to the vertebrobasilar junction. Left vertebral artery hypoplastic and patent without visible stenosis. Partially visualized PICA are grossly patent. Basilar patent without stenosis. Superior cerebellar and posterior cerebral arteries patent bilaterally. Distal small vessel atheromatous irregularity noted. Anatomic variants: As above.  No visible aneurysm. IMPRESSION: MRI HEAD: 1. Evolving right MCA territory infarct. Mild associated petechial blood products at the right frontoparietal region. No significant regional mass effect. 2. Small volume acute subarachnoid hemorrhage at the base of the right Sylvian fissure. This was also seen on intra procedural head CT from 06/25/2024. 3. Underlying age-related cerebral atrophy with moderate chronic microvascular ischemic disease. MRA HEAD: 1. Persistent and/or recurrent proximal right M2 occlusion, superior division. Overall appearance is similar to prior CTA. 2. Otherwise wide patency of the intracranial arterial circulation. No other hemodynamically significant or correctable stenosis. Electronically Signed   By: Morene Hoard M.D.   On: 06/27/2024 02:52   MR  BRAIN WO CONTRAST Result Date: 06/27/2024 CLINICAL DATA:  Follow-up examination for acute stroke, status post revascularization. EXAM: MRI HEAD WITHOUT CONTRAST MRA HEAD WITHOUT CONTRAST TECHNIQUE: Multiplanar, multi-echo pulse sequences of the brain and surrounding structures were acquired without intravenous contrast. Angiographic images of the Circle of Willis were acquired using MRA technique without intravenous contrast. COMPARISON:  Comparison made with prior studies from 06/25/2024 FINDINGS: MRI HEAD FINDINGS Brain: Examination mildly degraded by motion artifact. Generalized age-related cerebral atrophy. Patchy and  confluent T2/FLAIR hyperintensity involving the periventricular deep white matter both cerebral hemispheres, consistent with chronic small vessel ischemic disease, moderately advanced in nature. Patchy and confluent restricted diffusion involving the right cerebral hemisphere, with involvement of the right frontal, parietal, and temporal lobes, consistent with an evolving right MCA territory infarct. Mild associated petechial blood products near the right frontoparietal region (series 29, image 32). Additionally, susceptibility artifact at the base of the sylvian fissure consistent with small volume acute subarachnoid hemorrhage (series 29, image 29). This was also seen on intra procedural head CT. No significant regional mass effect. No other evidence for acute or subacute ischemia. No other areas of chronic cortical infarction. No other acute or chronic intracranial blood products. No mass lesion or midline shift. No hydrocephalus or extra-axial fluid collection. Pituitary gland within normal limits. Vascular: Major intracranial vascular flow voids are maintained. Skull and upper cervical spine: Craniocervical junction within normal limits. Bone marrow signal intensity within normal limits. No scalp soft tissue abnormality. Sinuses/Orbits: Right gaze preference noted. Prior bilateral ocular lens  replacement. Paranasal sinuses are largely clear. Trace bilateral mastoid effusions noted of doubtful significance. Other: None. MRA HEAD FINDINGS Anterior circulation: Examination mildly degraded by motion artifact. Both internal carotid arteries remain patent through the siphons without stenosis or other abnormality. A1 segments, anterior communicating artery complex, and anterior cerebral arteries patent without stenosis. Left M1 segment and distal left MCA branches are widely patent and well perfused. Right M1 segment widely patent proximally, but becomes increasing attenuated as it courses towards the bifurcation. There is persistent and/or recurrent proximal right M2 occlusion, superior division (series 8, image 125). Overall appearance is similar to prior CTA. Remainder of the right MCA branches are somewhat attenuated but remain patent. Distal small vessel atheromatous irregularity noted. Posterior circulation: Right vertebral artery dominant and widely patent to the vertebrobasilar junction. Left vertebral artery hypoplastic and patent without visible stenosis. Partially visualized PICA are grossly patent. Basilar patent without stenosis. Superior cerebellar and posterior cerebral arteries patent bilaterally. Distal small vessel atheromatous irregularity noted. Anatomic variants: As above.  No visible aneurysm. IMPRESSION: MRI HEAD: 1. Evolving right MCA territory infarct. Mild associated petechial blood products at the right frontoparietal region. No significant regional mass effect. 2. Small volume acute subarachnoid hemorrhage at the base of the right Sylvian fissure. This was also seen on intra procedural head CT from 06/25/2024. 3. Underlying age-related cerebral atrophy with moderate chronic microvascular ischemic disease. MRA HEAD: 1. Persistent and/or recurrent proximal right M2 occlusion, superior division. Overall appearance is similar to prior CTA. 2. Otherwise wide patency of the intracranial  arterial circulation. No other hemodynamically significant or correctable stenosis. Electronically Signed   By: Morene Hoard M.D.   On: 06/27/2024 02:52   Portable Chest x-ray Result Date: 06/25/2024 CLINICAL DATA:  Intubated EXAM: PORTABLE CHEST 1 VIEW COMPARISON:  06/23/2024, 03/16/2020 FINDINGS: Endotracheal tube tip is about 2.4 cm superior to the carina. Enteric tube tip below the diaphragm but incompletely assessed. Minimal patchy atelectasis or scarring left base. Stable cardiomediastinal silhouette with aortic atherosclerosis. No pneumothorax IMPRESSION: Endotracheal tube tip about 2.4 cm superior to the carina. Minimal patchy atelectasis or scarring at the left base. Electronically Signed   By: Luke Bun M.D.   On: 06/25/2024 23:38   DG Abd 1 View Result Date: 06/25/2024 CLINICAL DATA:  Feeding tube placement EXAM: ABDOMEN - 1 VIEW COMPARISON:  03/20/2024 FINDINGS: Enteric tube tip and side port overlie the proximal stomach. Aortic atherosclerosis. Contrast within the renal collecting  systems. IMPRESSION: Enteric tube tip and side port overlie the proximal stomach. Electronically Signed   By: Luke Bun M.D.   On: 06/25/2024 23:37   CT ANGIO HEAD NECK W WO CM (CODE STROKE) Result Date: 06/25/2024 CLINICAL DATA:  Neuro deficit, acute, stroke suspected EXAM: CT ANGIOGRAPHY HEAD AND NECK WITH AND WITHOUT CONTRAST TECHNIQUE: Multidetector CT imaging of the head and neck was performed using the standard protocol during bolus administration of intravenous contrast. Multiplanar CT image reconstructions and MIPs were obtained to evaluate the vascular anatomy. Carotid stenosis measurements (when applicable) are obtained utilizing NASCET criteria, using the distal internal carotid diameter as the denominator. RADIATION DOSE REDUCTION: This exam was performed according to the departmental dose-optimization program which includes automated exposure control, adjustment of the mA and/or kV according  to patient size and/or use of iterative reconstruction technique. CONTRAST:  75mL OMNIPAQUE  IOHEXOL  350 MG/ML SOLN COMPARISON:  CT of the head dated June 25, 2024. FINDINGS: CTA NECK FINDINGS Aortic arch: Mild to moderate calcific plaque within the aortic arch. The brachiocephalic artery peers widely patent. Right carotid system: The common carotid and internal carotid arteries are normal in caliber and demonstrates mild calcific atheromatous disease. The internal carotid artery is tortuous. Left carotid system: The common carotid artery is normal in caliber and demonstrates mild calcific atheromatous disease. There is mild calcific plaque present proximally within the internal carotid artery, but no luminal stenosis. Vertebral arteries: The right vertebral artery is dominant and the left is relatively hypoplastic. The V1 segment is tortuous. Skeleton: Multilevel degenerative disc disease throughout the cervical spine. No osseous lesions. Other neck: Negative. Upper chest: Mild-to-moderate central lobular emphysema. Review of the MIP images confirms the above findings CTA HEAD FINDINGS Anterior circulation: There is an abrupt cut off of the distal M1 segment of the right middle cerebral artery with limited filling of the M2 segments. The anterior cerebral arteries and the left middle cerebral artery are normal in caliber and demonstrate no evidence of aneurysm or flow-limiting stenosis. There is mild calcific plaque within the carotid siphons, but no flow limiting stenosis. Posterior circulation: The vertebrobasilar system is unremarkable. The posterior cerebral arteries and the cerebellar arteries are patent. The right vertebral artery is dominant. Venous sinuses: Patent. Anatomic variants: None. Review of the MIP images confirms the above findings IMPRESSION: 1. Abrupt occlusion of the distal M1 segment of the right middle cerebral artery. These results were called by telephone at the time of interpretation on  06/25/2024 at 7:21 pm to provider Dr. LOLA JERNIGAN , who verbally acknowledged these results. Electronically Signed   By: Evalene Coho M.D.   On: 06/25/2024 19:32   CT HEAD CODE STROKE WO CONTRAST Result Date: 06/25/2024 CLINICAL DATA:  Code stroke.  Neuro deficit, concern for stroke. EXAM: CT HEAD WITHOUT CONTRAST TECHNIQUE: Contiguous axial images were obtained from the base of the skull through the vertex without intravenous contrast. RADIATION DOSE REDUCTION: This exam was performed according to the departmental dose-optimization program which includes automated exposure control, adjustment of the mA and/or kV according to patient size and/or use of iterative reconstruction technique. COMPARISON:  MRI head 05/06/2022. FINDINGS: Brain: No acute intracranial hemorrhage. No CT evidence of acute infarct. Nonspecific hypoattenuation in the periventricular and subcortical white matter favored to reflect chronic microvascular ischemic changes. no edema, mass effect, or midline shift. The basilar cisterns are patent. Ventricles: The ventricles are normal. Vascular: No hyperdense vessel or unexpected calcification. Skull: No acute or aggressive finding. Orbits: Bilateral lens replacement.  Sinuses: The visualized paranasal sinuses are clear. Other: Mastoid air cells are clear. ASPECTS [facility] Stroke Program Early CT Score) - Ganglionic level infarction (caudate, lentiform nuclei, internal capsule, insula, M1-M3 cortex): 7 - Supraganglionic infarction (M4-M6 cortex): 3 Total score (0-10 with 10 being normal): 10 IMPRESSION: 1. No CT evidence of acute intracranial abnormality. 2. ASPECTS is 10 These results were communicated to Dr. Jerrie at 6:58 pm on 06/25/2024 by text page via the [facility] messaging system. Electronically Signed   By: Donnice Mania M.D.   On: 06/25/2024 18:58   ECHOCARDIOGRAM COMPLETE Result Date: 06/25/2024    ECHOCARDIOGRAM REPORT   Patient Name:   [name] Date of Exam: 06/25/2024 Medical  Rec #:  [id number]       Height:       61.0 in Accession #:    [id number]      Weight:       149.7 lb Date of Birth:  1932-12-09       BSA:          1.670 m Patient Age:    89 years        BP:           118/47 mmHg Patient Gender: F               HR:           71 bpm. Exam Location:  Inpatient Procedure: 2D Echo, Cardiac Doppler and Color Doppler (Both Spectral and Color            Flow Doppler were utilized during procedure). Indications:    CHF-Acute Diastolic I50.31  History:        Patient has prior history of Echocardiogram examinations, most                 recent 06/09/2022. COPD; Risk Factors:Dyslipidemia and Current                 Smoker. Breast cancer (R) breast.  Sonographer:    Thea Norlander RCS Referring Phys: [id number] VASUNDHRA RATHORE  Sonographer Comments: Image acquisition challenging due to patient body habitus. IMPRESSIONS  1. Left ventricular ejection fraction, by estimation, is 60 to 65%. The left ventricle has normal function. The left ventricle has no regional wall motion abnormalities. Left ventricular diastolic parameters are consistent with Grade II diastolic dysfunction (pseudonormalization).  2. Right ventricular systolic function is normal. The right ventricular size is normal.  3. Left atrial size was mildly dilated.  4. The mitral valve is normal in structure. No evidence of mitral valve regurgitation. No evidence of mitral stenosis.  5. The aortic valve is tricuspid. Aortic valve regurgitation is not visualized. No aortic stenosis is present.  6. The inferior vena cava is normal in size with greater than 50% respiratory variability, suggesting right atrial pressure of 3 mmHg. FINDINGS  Left Ventricle: Left ventricular ejection fraction, by estimation, is 60 to 65%. The left ventricle has normal function. The left ventricle has no regional wall motion abnormalities. The left ventricular internal cavity size was normal in size. There is  no left ventricular hypertrophy. Left  ventricular diastolic parameters are consistent with Grade II diastolic dysfunction (pseudonormalization). Right Ventricle: The right ventricular size is normal. Right ventricular systolic function is normal. Left Atrium: Left atrial size was mildly dilated. Right Atrium: Right atrial size was normal in size. Pericardium: There is no evidence of pericardial effusion. Mitral Valve: The mitral valve is normal in structure. Mild mitral annular calcification. No  evidence of mitral valve regurgitation. No evidence of mitral valve stenosis. Tricuspid Valve: The tricuspid valve is normal in structure. Tricuspid valve regurgitation is trivial. No evidence of tricuspid stenosis. Aortic Valve: The aortic valve is tricuspid. Aortic valve regurgitation is not visualized. No aortic stenosis is present. Aortic valve peak gradient measures 3.2 mmHg. Pulmonic Valve: The pulmonic valve was normal in structure. Pulmonic valve regurgitation is mild. No evidence of pulmonic stenosis. Aorta: The aortic root is normal in size and structure. Venous: The inferior vena cava is normal in size with greater than 50% respiratory variability, suggesting right atrial pressure of 3 mmHg. IAS/Shunts: No atrial level shunt detected by color flow Doppler.  LEFT VENTRICLE PLAX 2D LVIDd:         3.70 cm   Diastology LVIDs:         2.40 cm   LV e' medial:    5.87 cm/s LV PW:         1.10 cm   LV E/e' medial:  12.9 LV IVS:        0.90 cm   LV e' lateral:   7.83 cm/s LVOT diam:     2.10 cm   LV E/e' lateral: 9.6 LV SV:         46 LV SV Index:   28 LVOT Area:     3.46 cm  RIGHT VENTRICLE            IVC RV S prime:     7.54 cm/s  IVC diam: 1.40 cm LEFT ATRIUM           Index        RIGHT ATRIUM           Index LA diam:      3.10 cm 1.86 cm/m   RA Area:     13.20 cm LA Vol (A4C): 43.4 ml 25.96 ml/m  RA Volume:   25.90 ml  15.51 ml/m  AORTIC VALVE AV Area (Vmax): 2.63 cm AV Vmax:        88.90 cm/s AV Peak Grad:   3.2 mmHg LVOT Vmax:      67.60 cm/s LVOT  Vmean:     40.400 cm/s LVOT VTI:       0.133 m  AORTA Ao Root diam: 3.60 cm Ao Asc diam:  3.30 cm MITRAL VALVE MV Area (PHT): 3.37 cm    SHUNTS MV Decel Time: 225 msec    Systemic VTI:  0.13 m MV E velocity: 75.50 cm/s  Systemic Diam: 2.10 cm MV A velocity: 61.30 cm/s MV E/A ratio:  1.23 Redell Shallow MD Electronically signed by Redell Shallow MD Signature Date/Time: 06/25/2024/4:59:34 PM    Final    NM Hepatobiliary Liver Func Result Date: 06/24/2024 CLINICAL DATA:  Cholecystitis. EXAM: NUCLEAR MEDICINE HEPATOBILIARY IMAGING TECHNIQUE: Sequential images of the abdomen were obtained out to 60 minutes following intravenous administration of radiopharmaceutical. RADIOPHARMACEUTICALS:  5 mCi Tc-57m  Choletec  IV 2.7 mg morphine  also administered IV. COMPARISON:  MRI 06/22/2024 FINDINGS: Satisfactory uptake of radiopharmaceutical from the blood pool. Biliary activity visible at 8 minutes. Bowel activity visible at 14 minutes. Initial non filling of the gallbladder over 60 minutes. We administered the 2.7 mg of morphine  IV to cause contraction of the sphincter of OD. Subsequently the gallbladder opacifies, first becoming visible at 19 minutes post morphine  administration and subsequently demonstrating increasing activity until the 30 minute mark. Accordingly the cystic duct and common bile duct are both patent. IMPRESSION: 1. Patent cystic  duct, accordingly no supportive evidence for acute cholecystitis. 2. Patent common bile duct 3. Overall normal exam. Electronically Signed   By: Ryan Salvage M.D.   On: 06/24/2024 13:57   DG CHEST PORT 1 VIEW Result Date: 06/23/2024 CLINICAL DATA:  Cough EXAM: PORTABLE CHEST 1 VIEW COMPARISON:  Chest radiograph February 18, 2024 FINDINGS: The heart size and mediastinal contours are within normal limits. Increased interstitial markings of both lung fields without significant consolidation or suspicious pulmonary nodule, increased to prior. Atherosclerotic calcifications of aortic  knob and descending aorta. Chronic left rib fractures with mild displacement. No pleural effusion or pneumothorax. IMPRESSION: Diffuse increased interstitial markings of both lung fields without focal consolidation or suspicious pulmonary nodules which can be seen the setting of infection/inflammation, or early pulmonary edema. Correlate with clinical findings. . Electronically Signed   By: Megan  Zare M.D.   On: 06/23/2024 17:15   MR 3D Recon At Scanner Result Date: 06/22/2024 CLINICAL DATA:  Elevated liver function tests. EXAM: MRI ABDOMEN WITHOUT AND WITH CONTRAST (INCLUDING MRCP) TECHNIQUE: Multiplanar multisequence MR imaging of the abdomen was performed both before and after the administration of intravenous contrast. Heavily T2-weighted images of the biliary and pancreatic ducts were obtained, and three-dimensional MRCP images were rendered by post processing. CONTRAST:  7mL GADAVIST  GADOBUTROL  1 MMOL/ML IV SOLN COMPARISON:  CT 06/21/2024 FINDINGS: Exam is degraded patient respiratory motion. Patient has difficulty falling breath hold commands. Lower chest:  Lung bases are clear. Hepatobiliary: No intrahepatic biliary duct dilatation. No hepatic steatosis. Normal liver parenchyma. Within the lateral segment LEFT hepatic lobe 13 mm lesion is hyperintense on T2 weighted imaging corresponds to lesion of concern on comparison CT. There is motion degradation of the postcontrast T1 weighted imaging however lesion appears to have peripheral enhancement which is typical of benign hemangiomas. MRCP sequences are degraded by patient respiratory motion. No gallstones are evident. No biliary duct dilatation evident. No choledocholithiasis. Common bile duct measures 5 mm (image 21/series 3). Pancreas: Normal pancreatic parenchymal intensity. No ductal dilatation or inflammation. Spleen: Normal spleen. Adrenals/urinary tract: Adrenal glands and kidneys are normal. Stomach/Bowel: Stomach and limited of the small bowel is  unremarkable Vascular/Lymphatic: Abdominal aortic normal caliber. No retroperitoneal periportal lymphadenopathy. Musculoskeletal: No aggressive osseous lesion IMPRESSION: 1. Certain sequences are severely degraded by patient respiratory motion. 2. No evidence acute cholecystitis. 3. No choledocholithiasis. 4. Normal common hepatic duct and common bile duct. No evidence of biliary obstruction. 5. No hepatic steatosis. 6. Probable benign hemangioma in the RIGHT hepatic lobe. Electronically Signed   By: Jackquline Boxer M.D.   On: 06/22/2024 16:06   MR ABDOMEN MRCP W WO CONTAST Result Date: 06/22/2024 CLINICAL DATA:  Elevated liver function tests. EXAM: MRI ABDOMEN WITHOUT AND WITH CONTRAST (INCLUDING MRCP) TECHNIQUE: Multiplanar multisequence MR imaging of the abdomen was performed both before and after the administration of intravenous contrast. Heavily T2-weighted images of the biliary and pancreatic ducts were obtained, and three-dimensional MRCP images were rendered by post processing. CONTRAST:  7mL GADAVIST  GADOBUTROL  1 MMOL/ML IV SOLN COMPARISON:  CT 06/21/2024 FINDINGS: Exam is degraded patient respiratory motion. Patient has difficulty falling breath hold commands. Lower chest:  Lung bases are clear. Hepatobiliary: No intrahepatic biliary duct dilatation. No hepatic steatosis. Normal liver parenchyma. Within the lateral segment LEFT hepatic lobe 13 mm lesion is hyperintense on T2 weighted imaging corresponds to lesion of concern on comparison CT. There is motion degradation of the postcontrast T1 weighted imaging however lesion appears to have peripheral enhancement which is  typical of benign hemangiomas. MRCP sequences are degraded by patient respiratory motion. No gallstones are evident. No biliary duct dilatation evident. No choledocholithiasis. Common bile duct measures 5 mm (image 21/series 3). Pancreas: Normal pancreatic parenchymal intensity. No ductal dilatation or inflammation. Spleen: Normal  spleen. Adrenals/urinary tract: Adrenal glands and kidneys are normal. Stomach/Bowel: Stomach and limited of the small bowel is unremarkable Vascular/Lymphatic: Abdominal aortic normal caliber. No retroperitoneal periportal lymphadenopathy. Musculoskeletal: No aggressive osseous lesion IMPRESSION: 1. Certain sequences are severely degraded by patient respiratory motion. 2. No evidence acute cholecystitis. 3. No choledocholithiasis. 4. Normal common hepatic duct and common bile duct. No evidence of biliary obstruction. 5. No hepatic steatosis. 6. Probable benign hemangioma in the RIGHT hepatic lobe. Electronically Signed   By: Jackquline Boxer M.D.   On: 06/22/2024 16:06   US  Abdomen Limited RUQ (LIVER/GB) Result Date: 06/21/2024 CLINICAL DATA:  Upper abdominal pain EXAM: ULTRASOUND ABDOMEN LIMITED RIGHT UPPER QUADRANT COMPARISON:  CT 06/21/2024 FINDINGS: Gallbladder: No shadowing stones. Gallbladder distension. Focal wall thickening measuring up to 8 mm at the fundus. Negative sonographic Murphy. Common bile duct: Diameter: 7.1 mm Liver: Echogenic nodule left hepatic lobe measuring 14 x 14 x 15 mm likely corresponding to hypodensity on CT. Portal vein is patent on color Doppler imaging with normal direction of blood flow towards the liver. Other: None. IMPRESSION: 1. Gallbladder distension with focal echogenic wall thickening at the fundus. No shadowing stones. Negative sonographic Murphy. Findings are nonspecific and could be due to focal gallbladder wall inflammatory process. Gallbladder wall thickening secondary to neoplasm is possible but no masslike features on previously performed CT. 2. Common bile duct upper normal at 7.1 mm. Correlate with LFTs. 3. 15 mm echogenic nodule in the left hepatic lobe likely corresponding to hypodensity on CT, possible hemangioma. This could be confirmed with nonemergent MRI if desired given patient age. Electronically Signed   By: Luke Bun M.D.   On: 06/21/2024 21:13    CT ABDOMEN PELVIS W CONTRAST Result Date: 06/21/2024 CLINICAL DATA:  Abdomen pain EXAM: CT ABDOMEN AND PELVIS WITH CONTRAST TECHNIQUE: Multidetector CT imaging of the abdomen and pelvis was performed using the standard protocol following bolus administration of intravenous contrast. RADIATION DOSE REDUCTION: This exam was performed according to the departmental dose-optimization program which includes automated exposure control, adjustment of the mA and/or kV according to patient size and/or use of iterative reconstruction technique. CONTRAST:  75mL OMNIPAQUE  IOHEXOL  350 MG/ML SOLN COMPARISON:  CT 03/14/2020 FINDINGS: Lower chest: Lung bases demonstrate mild subpleural scarring. No acute airspace disease. Pectus deformity of the lower anterior chest wall. Hepatobiliary: Distended gallbladder. No calcified stones. Possible trace pericholecystic fluid versus gallbladder wall thickening on coronal images. No significant biliary dilatation. Indeterminate hypodensity in the left hepatic lobe measuring about 13 mm on series 3, image 21. Pancreas: Atrophic.  No acute inflammation Spleen: Normal in size without focal abnormality. Adrenals/Urinary Tract: Adrenal glands are normal. Kidneys show slight prominence of collecting systems but no hydronephrosis or obstructing stone. Bladder is unremarkable Stomach/Bowel: The stomach is nonenlarged. No dilated small bowel. Diverticular disease of the left colon. No acute wall thickening Vascular/Lymphatic: Aortic atherosclerosis. No enlarged abdominal or pelvic lymph nodes. Reproductive: Status post hysterectomy. No adnexal masses. Other: Negative for pelvic effusion or free air. Musculoskeletal: No acute or suspicious osseous abnormality. Multilevel degenerative change IMPRESSION: 1. Distended gallbladder with possible trace pericholecystic fluid versus gallbladder wall thickening. Suggest correlation with right upper quadrant ultrasound. 2. Diverticular disease of the left  colon without acute  inflammatory process. 3. Indeterminate 13 mm hypodensity in the left hepatic lobe. When the patient is clinically stable and able to follow directions and hold their breath (preferably as an outpatient) further evaluation with dedicated abdominal MRI should be considered if deemed clinically appropriate 4. Aortic atherosclerosis. Aortic Atherosclerosis (ICD10-I70.0). Electronically Signed   By: Luke Bun M.D.   On: 06/21/2024 17:23       HISTORY OF PRESENT ILLNESS 89 y.o. patient with history of COPD, PAD, hyperlipidemia, sleep apnea, right breast carcinoma status post lung ectomy, radiation and hormone therapy in remission was admitted initially with right upper quadrant abdominal pain, nausea and vomiting.  HOSPITAL COURSE Patient was initially found to have possible cholecystitis and E. coli bacteremia and was treated with appropriate antibiotics.  She did have some encephalopathy and hallucinations.  She then developed sudden onset left-sided weakness with right gaze deviation and dysarthria.  She was found to be having a right MCA stroke with right M1 occlusion and was given TNK and taken to interventional radiology for mechanical thrombectomy, with TICI 2B revascularization achieved.  After her procedure, she was noted to have a small right perisylvian subarachnoid hemorrhage and recurrence of her proximal right M2 occlusion.  She is now ready to be discharged to inpatient rehabilitation.  Stroke:  right MCA territory infarct with R M1 occlusion s/p TNK and IR with TICI 2B initially but persistent occlusion eventually, etiology:  most likely large vessel disease    Code Stroke CT head No CT evidence of acute intracranial abnormality. ASPECTS is 10. CTA head & neck Abrupt occlusion of the distal M1 segment of the right middle cerebral artery S/p IR with repeated  reocclusion secondary to underlying long segment atherosclerotic plaque and final score TICI2b Post IR CT small  focal right perisylvian subarachnoid hemorrhage. MRI brain  Evolving right MCA territory infarct. Mild associated petechial blood products at the right frontoparietal region. No significant regional mass effect. 2. Small volume acute subarachnoid hemorrhage at the base of the right Sylvian fissure.   MRA Persistent and/or recurrent proximal right M2 occlusion, superior division.  2D Echo LVEF 60-65%, mild atrial dilation LDL 46 HgbA1c 5.1 VTE prophylaxis - heparin  subq aspirin  81 mg daily prior to admission, now on aspirin  81 mg daily given SAH.  Will hold off plavix now given potential PEG Therapy recommendations:  CIR Disposition: CIR today   ? Hx of Stroke/TIA Acute onset of vertigo and gait disturbance in June 2018 with residual truncal ataxia requiring use of walker for ambulation  Concern for possible brainstem/cerebellar stroke at that time  Recommended for MRI evaluation at that time but this was lost to follow up    Acute Acalculous Cholecystitis E. Coli bacteremia Elevated LFTs, resolved Admitted for right upper quadrant pain and nausea vomiting. 7/5 blood cultures + for E. Coli S/p Flagyl  S/p Cefadoxil  AST/ALT 1658/958 -> ...-> 25/57->37/60->25/39->31/33 Less likely endocarditis given the bacteria species   HTN Home meds:  None Stable Add PRN labetalol , hydralazine  for BP control  Blood pressure goal SBP <160 given SAH Long term BP goal: Normotension   Hyperlipidemia Home meds:  atorvastatin  10 mg LDL 46, goal < 70 Now on lipitor 10 No high intensity statin given LDL at goal and advanced age Consider statin on discharge   Dysphagia Patient has post-stroke dysphagia SLP consult Currently n.p.o. S/p cortrak  Continue TF @ 42 Patient may need PEG tube if swallowing ability does not improve   Other Stroke Risk Factors Advanced age  Family hx stroke (possible stroke versus MI in patient's father) Remote tobacco use history 56-pack-year history, stopped smoking  8 years ago CAD/PAD Obstructive sleep apnea, on CPAP at home though reported not using effectively PTA   Other Active Problems Anemia, Hbg 13.8 on admission > 10.6 >11.2 Depression on Lexapro  COPD Lower back pain, on Tylenol  and the Flexeril  as needed.  Will add tramadol  as needed too   DISCHARGE EXAM  PHYSICAL EXAM General:  Alert, well-nourished, well-developed patient in no acute distress Psych:  Mood and affect appropriate for situation CV: Regular rate and rhythm on monitor Respiratory:  Regular, unlabored respirations on room air  NEURO:  Mental Status: Patient is alert and oriented to person place time and situation Speech/Language: speech is with mild dysarthria but without aphasia.    Cranial Nerves:  II: PERRL.  Left hemianopsia III, IV, VI: Right gaze deviation, unable to cross midline V: Sensation is intact to light touch and symmetrical to face.  VII: Subtle left facial droop VIII: hearing intact to voice. IX, X: Voice is slightly dysarthric XII: tongue is midline without fasciculations. Motor: Able to move right upper and lower extremities with good antigravity strength, no movement of left upper extremity, moves left lower extremity but does not lift it off the bed Tone: is normal and bulk is normal Sensation- Intact to light touch on the right, diminished on the left Coordination: FTN intact on the right, unable to perform on the left Gait- deferred  1a Level of Conscious.: 0 1b LOC Questions: 0 1c LOC Commands: 0 2 Best Gaze: 1 3 Visual: 1 4 Facial Palsy: 1 5a Motor Arm - left: 4 5b Motor Arm - Right: 0 6a Motor Leg - Left: 3 6b Motor Leg - Right: 0 7 Limb Ataxia: 0 8 Sensory: 2 9 Best Language: 0 10 Dysarthria: 1 11 Extinct. and Inatten.: 0 TOTAL: 13   Discharge Diet       Diet   Diet NPO time specified Except for: Ice Chips   liquids  DISCHARGE PLAN Disposition: Rehab aspirin  81 mg daily for secondary stroke prevention, add Plavix when  decision has been made about PEG tube Ongoing stroke risk factor control by Primary Care Physician at time of discharge Follow-up PCP [name], MD in 2 weeks. Follow-up in [facility] in 8 weeks, office to schedule an appointment.   35 minutes were spent preparing discharge.  Cortney E Everitt Clint Kill , MSN, AGACNP-BC [facility] See Amion for schedule and pager information 07/08/2024 12:07 PM  ATTENDING NOTE: I reviewed above note and agree with the assessment and plan. Pt was seen and examined.   Daughter is at the bedside. Pt lying in bed, eyes open, no distress, still has right gaze preference and left neglect with left hemiplegia. Still not able to pass swallow, NPO now, continue TF with potential PEG in the future if needed. Approved for CIR and will d/c to CIR today. Continue ASA and statin. Will follow up at [facility].   For detailed assessment and plan, please refer to above as I have made changes wherever appropriate.   Ary Cummins, MD PhD Stroke Neurology 07/08/2024 12:45 PM

## 2024-07-08 ENCOUNTER — Other Ambulatory Visit: Payer: Self-pay | Admitting: Cardiology

## 2024-07-08 ENCOUNTER — Other Ambulatory Visit: Payer: Self-pay

## 2024-07-08 ENCOUNTER — Inpatient Hospital Stay (HOSPITAL_COMMUNITY)

## 2024-07-08 ENCOUNTER — Encounter (HOSPITAL_COMMUNITY): Payer: Self-pay | Admitting: Physical Medicine and Rehabilitation

## 2024-07-08 ENCOUNTER — Inpatient Hospital Stay (HOSPITAL_COMMUNITY)
Admission: AD | Admit: 2024-07-08 | Discharge: 2024-08-01 | DRG: 057 | Disposition: A | Discharge status: Home Health | Source: Intra-hospital | Attending: Physical Medicine and Rehabilitation | Admitting: Physical Medicine and Rehabilitation

## 2024-07-08 DIAGNOSIS — Z7401 Bed confinement status: Secondary | ICD-10-CM | POA: Diagnosis not present

## 2024-07-08 DIAGNOSIS — I69391 Dysphagia following cerebral infarction: Secondary | ICD-10-CM

## 2024-07-08 DIAGNOSIS — R29713 NIHSS score 13: Secondary | ICD-10-CM

## 2024-07-08 DIAGNOSIS — F05 Delirium due to known physiological condition: Secondary | ICD-10-CM | POA: Diagnosis not present

## 2024-07-08 DIAGNOSIS — R4182 Altered mental status, unspecified: Secondary | ICD-10-CM | POA: Diagnosis not present

## 2024-07-08 DIAGNOSIS — Z923 Personal history of irradiation: Secondary | ICD-10-CM | POA: Diagnosis not present

## 2024-07-08 DIAGNOSIS — K029 Dental caries, unspecified: Secondary | ICD-10-CM | POA: Diagnosis not present

## 2024-07-08 DIAGNOSIS — R6884 Jaw pain: Secondary | ICD-10-CM | POA: Diagnosis not present

## 2024-07-08 DIAGNOSIS — F32A Depression, unspecified: Secondary | ICD-10-CM | POA: Diagnosis not present

## 2024-07-08 DIAGNOSIS — Z91040 Latex allergy status: Secondary | ICD-10-CM

## 2024-07-08 DIAGNOSIS — M7918 Myalgia, other site: Secondary | ICD-10-CM | POA: Diagnosis not present

## 2024-07-08 DIAGNOSIS — H3530 Unspecified macular degeneration: Secondary | ICD-10-CM | POA: Diagnosis present

## 2024-07-08 DIAGNOSIS — M25561 Pain in right knee: Secondary | ICD-10-CM | POA: Diagnosis present

## 2024-07-08 DIAGNOSIS — J439 Emphysema, unspecified: Secondary | ICD-10-CM | POA: Diagnosis present

## 2024-07-08 DIAGNOSIS — Z888 Allergy status to other drugs, medicaments and biological substances status: Secondary | ICD-10-CM

## 2024-07-08 DIAGNOSIS — I618 Other nontraumatic intracerebral hemorrhage: Secondary | ICD-10-CM | POA: Diagnosis not present

## 2024-07-08 DIAGNOSIS — I69322 Dysarthria following cerebral infarction: Secondary | ICD-10-CM | POA: Diagnosis not present

## 2024-07-08 DIAGNOSIS — R682 Dry mouth, unspecified: Secondary | ICD-10-CM | POA: Diagnosis not present

## 2024-07-08 DIAGNOSIS — L570 Actinic keratosis: Secondary | ICD-10-CM | POA: Diagnosis present

## 2024-07-08 DIAGNOSIS — R5381 Other malaise: Secondary | ICD-10-CM | POA: Diagnosis present

## 2024-07-08 DIAGNOSIS — I69398 Other sequelae of cerebral infarction: Secondary | ICD-10-CM

## 2024-07-08 DIAGNOSIS — R197 Diarrhea, unspecified: Secondary | ICD-10-CM | POA: Diagnosis not present

## 2024-07-08 DIAGNOSIS — E440 Moderate protein-calorie malnutrition: Secondary | ICD-10-CM | POA: Diagnosis not present

## 2024-07-08 DIAGNOSIS — G8190 Hemiplegia, unspecified affecting unspecified side: Secondary | ICD-10-CM | POA: Diagnosis not present

## 2024-07-08 DIAGNOSIS — R1312 Dysphagia, oropharyngeal phase: Secondary | ICD-10-CM | POA: Diagnosis present

## 2024-07-08 DIAGNOSIS — G8929 Other chronic pain: Secondary | ICD-10-CM | POA: Diagnosis present

## 2024-07-08 DIAGNOSIS — H05232 Hemorrhage of left orbit: Secondary | ICD-10-CM | POA: Diagnosis not present

## 2024-07-08 DIAGNOSIS — G8114 Spastic hemiplegia affecting left nondominant side: Secondary | ICD-10-CM | POA: Diagnosis not present

## 2024-07-08 DIAGNOSIS — I63511 Cerebral infarction due to unspecified occlusion or stenosis of right middle cerebral artery: Principal | ICD-10-CM | POA: Diagnosis present

## 2024-07-08 DIAGNOSIS — M549 Dorsalgia, unspecified: Secondary | ICD-10-CM | POA: Diagnosis present

## 2024-07-08 DIAGNOSIS — Z7409 Other reduced mobility: Secondary | ICD-10-CM | POA: Diagnosis present

## 2024-07-08 DIAGNOSIS — R001 Bradycardia, unspecified: Secondary | ICD-10-CM | POA: Diagnosis present

## 2024-07-08 DIAGNOSIS — R9431 Abnormal electrocardiogram [ECG] [EKG]: Secondary | ICD-10-CM | POA: Diagnosis present

## 2024-07-08 DIAGNOSIS — G4733 Obstructive sleep apnea (adult) (pediatric): Secondary | ICD-10-CM | POA: Diagnosis present

## 2024-07-08 DIAGNOSIS — I6389 Other cerebral infarction: Secondary | ICD-10-CM | POA: Diagnosis not present

## 2024-07-08 DIAGNOSIS — F419 Anxiety disorder, unspecified: Secondary | ICD-10-CM | POA: Diagnosis present

## 2024-07-08 DIAGNOSIS — H53462 Homonymous bilateral field defects, left side: Secondary | ICD-10-CM | POA: Diagnosis present

## 2024-07-08 DIAGNOSIS — Z87440 Personal history of urinary (tract) infections: Secondary | ICD-10-CM

## 2024-07-08 DIAGNOSIS — Z8249 Family history of ischemic heart disease and other diseases of the circulatory system: Secondary | ICD-10-CM

## 2024-07-08 DIAGNOSIS — K0889 Other specified disorders of teeth and supporting structures: Secondary | ICD-10-CM | POA: Diagnosis not present

## 2024-07-08 DIAGNOSIS — I69392 Facial weakness following cerebral infarction: Secondary | ICD-10-CM | POA: Diagnosis not present

## 2024-07-08 DIAGNOSIS — R1011 Right upper quadrant pain: Secondary | ICD-10-CM | POA: Diagnosis not present

## 2024-07-08 DIAGNOSIS — M25512 Pain in left shoulder: Secondary | ICD-10-CM | POA: Diagnosis present

## 2024-07-08 DIAGNOSIS — Z4682 Encounter for fitting and adjustment of non-vascular catheter: Secondary | ICD-10-CM | POA: Diagnosis not present

## 2024-07-08 DIAGNOSIS — I69354 Hemiplegia and hemiparesis following cerebral infarction affecting left non-dominant side: Secondary | ICD-10-CM | POA: Diagnosis not present

## 2024-07-08 DIAGNOSIS — F5104 Psychophysiologic insomnia: Secondary | ICD-10-CM | POA: Diagnosis present

## 2024-07-08 DIAGNOSIS — D649 Anemia, unspecified: Secondary | ICD-10-CM | POA: Diagnosis not present

## 2024-07-08 DIAGNOSIS — Z87891 Personal history of nicotine dependence: Secondary | ICD-10-CM

## 2024-07-08 DIAGNOSIS — Z803 Family history of malignant neoplasm of breast: Secondary | ICD-10-CM

## 2024-07-08 DIAGNOSIS — K047 Periapical abscess without sinus: Secondary | ICD-10-CM | POA: Diagnosis not present

## 2024-07-08 DIAGNOSIS — R414 Neurologic neglect syndrome: Secondary | ICD-10-CM | POA: Diagnosis not present

## 2024-07-08 DIAGNOSIS — R531 Weakness: Secondary | ICD-10-CM | POA: Diagnosis not present

## 2024-07-08 DIAGNOSIS — Z79899 Other long term (current) drug therapy: Secondary | ICD-10-CM

## 2024-07-08 DIAGNOSIS — J449 Chronic obstructive pulmonary disease, unspecified: Secondary | ICD-10-CM | POA: Diagnosis present

## 2024-07-08 DIAGNOSIS — K1379 Other lesions of oral mucosa: Secondary | ICD-10-CM

## 2024-07-08 DIAGNOSIS — I639 Cerebral infarction, unspecified: Secondary | ICD-10-CM | POA: Diagnosis not present

## 2024-07-08 DIAGNOSIS — R1310 Dysphagia, unspecified: Secondary | ICD-10-CM | POA: Diagnosis not present

## 2024-07-08 DIAGNOSIS — K5901 Slow transit constipation: Secondary | ICD-10-CM | POA: Diagnosis not present

## 2024-07-08 DIAGNOSIS — G459 Transient cerebral ischemic attack, unspecified: Secondary | ICD-10-CM | POA: Diagnosis not present

## 2024-07-08 DIAGNOSIS — Z7982 Long term (current) use of aspirin: Secondary | ICD-10-CM

## 2024-07-08 DIAGNOSIS — Z833 Family history of diabetes mellitus: Secondary | ICD-10-CM

## 2024-07-08 DIAGNOSIS — I959 Hypotension, unspecified: Secondary | ICD-10-CM | POA: Diagnosis not present

## 2024-07-08 DIAGNOSIS — Z853 Personal history of malignant neoplasm of breast: Secondary | ICD-10-CM

## 2024-07-08 LAB — BASIC METABOLIC PANEL WITH GFR
Anion gap: 12 (ref 5–15)
BUN: 22 mg/dL (ref 8–23)
CO2: 27 mmol/L (ref 22–32)
Calcium: 9.4 mg/dL (ref 8.9–10.3)
Chloride: 99 mmol/L (ref 98–111)
Creatinine, Ser: 0.71 mg/dL (ref 0.44–1.00)
GFR, Estimated: >60 mL/min (ref >60)
Glucose, Bld: 127 mg/dL — ABNORMAL HIGH (ref 70–99)
Potassium: 4.6 mmol/L (ref 3.5–5.1)
Sodium: 138 mmol/L (ref 135–145)

## 2024-07-08 LAB — GLUCOSE, CAPILLARY
Glucose-Capillary: 112 mg/dL — ABNORMAL HIGH (ref 70–99)
Glucose-Capillary: 120 mg/dL — ABNORMAL HIGH (ref 70–99)
Glucose-Capillary: 100 mg/dL — ABNORMAL HIGH (ref 70–99)
Glucose-Capillary: 99 mg/dL (ref 70–99)
Glucose-Capillary: 107 mg/dL — ABNORMAL HIGH (ref 70–99)

## 2024-07-08 LAB — CBC
HCT: 34.5 % — ABNORMAL LOW (ref 36.0–46.0)
Hemoglobin: 11.2 g/dL — ABNORMAL LOW (ref 12.0–15.0)
MCH: 30.6 pg (ref 26.0–34.0)
MCHC: 32.5 g/dL (ref 30.0–36.0)
MCV: 94.3 fL (ref 80.0–100.0)
Platelets: 286 K/uL (ref 150–400)
RBC: 3.66 MIL/uL — ABNORMAL LOW (ref 3.87–5.11)
RDW: 14.1 % (ref 11.5–15.5)
WBC: 6.9 K/uL (ref 4.0–10.5)
nRBC: 0 % (ref 0.0–0.2)

## 2024-07-08 MED ORDER — PROSOURCE TF20 ENFIT COMPATIBL EN LIQD: 60.0000 mL | Freq: Every day | ENTERAL | 0 refills | Status: DC

## 2024-07-08 MED ORDER — DIPHENHYDRAMINE HCL 25 MG PO CAPS: 25.0000 mg | ORAL_CAPSULE | Freq: Four times a day (QID) | ORAL | Status: DC | PRN

## 2024-07-08 MED ORDER — ENOXAPARIN SODIUM 40 MG/0.4ML IJ SOSY
40.0000 mg | PREFILLED_SYRINGE | INTRAMUSCULAR | Status: DC
  Administered 2024-07-08 – 2024-07-31 (×26): 40 mg via SUBCUTANEOUS
  Filled 2024-07-08 (×23): qty 0.4

## 2024-07-08 MED ORDER — OSMOLITE 1.5 CAL PO LIQD
1000.0000 mL | ORAL | Status: DC
  Administered 2024-07-08 – 2024-07-09 (×2): 1000 mL
  Filled 2024-07-08: qty 1000

## 2024-07-08 MED ORDER — DICLOFENAC SODIUM 1 % EX GEL
2.0000 g | Freq: Four times a day (QID) | CUTANEOUS | Status: DC
  Administered 2024-07-08 – 2024-07-31 (×96): 2 g via TOPICAL
  Filled 2024-07-08 (×2): qty 100

## 2024-07-08 MED ORDER — GERHARDT'S BUTT CREAM: 1.0000 | TOPICAL_CREAM | Freq: Three times a day (TID) | CUTANEOUS | 0 refills | Status: AC

## 2024-07-08 MED ORDER — POLYVINYL ALCOHOL 1.4 % OP SOLN: 1.0000 [drp] | OPHTHALMIC | 0 refills | Status: DC | PRN

## 2024-07-08 MED ORDER — ATORVASTATIN CALCIUM 10 MG PO TABS
10.0000 mg | ORAL_TABLET | Freq: Every day | ORAL | Status: DC
  Administered 2024-07-09 – 2024-07-24 (×15): 10 mg
  Filled 2024-07-08 (×15): qty 1

## 2024-07-08 MED ORDER — ORAL CARE MOUTH RINSE
15.0000 mL | OROMUCOSAL | Status: DC
  Administered 2024-07-08 – 2024-07-21 (×46): 15 mL via OROMUCOSAL

## 2024-07-08 MED ORDER — POLYETHYLENE GLYCOL 3350 17 G PO PACK: 17.0000 g | PACK | Freq: Every day | ORAL | 0 refills | Status: AC | PRN

## 2024-07-08 MED ORDER — OSMOLITE 1.5 CAL PO LIQD: 1000.0000 mL | ORAL | 0 refills | Status: DC

## 2024-07-08 MED ORDER — CYCLOBENZAPRINE HCL 5 MG PO TABS
2.5000 mg | ORAL_TABLET | Freq: Three times a day (TID) | ORAL | Status: DC | PRN
  Administered 2024-07-08: 2.5 mg
  Filled 2024-07-08: qty 1

## 2024-07-08 MED ORDER — CYCLOBENZAPRINE HCL 5 MG PO TABS: 5.0000 mg | ORAL_TABLET | Freq: Three times a day (TID) | ORAL | 0 refills | Status: DC | PRN

## 2024-07-08 MED ORDER — PROCHLORPERAZINE EDISYLATE 10 MG/2ML IJ SOLN
5.0000 mg | Freq: Four times a day (QID) | INTRAMUSCULAR | Status: DC | PRN
  Administered 2024-07-14 – 2024-07-20 (×2): 10 mg via INTRAVENOUS
  Filled 2024-07-08 (×2): qty 2

## 2024-07-08 MED ORDER — ACETAMINOPHEN-CODEINE 300-30 MG PO TABS
1.0000 | ORAL_TABLET | Freq: Four times a day (QID) | ORAL | Status: DC | PRN
  Administered 2024-07-08 – 2024-07-14 (×5): 1
  Filled 2024-07-08 (×6): qty 1

## 2024-07-08 MED ORDER — POLYVINYL ALCOHOL 1.4 % OP SOLN: 1.0000 [drp] | OPHTHALMIC | Status: DC | PRN

## 2024-07-08 MED ORDER — GERHARDT'S BUTT CREAM
TOPICAL_CREAM | Freq: Three times a day (TID) | CUTANEOUS | Status: DC
  Administered 2024-07-18 – 2024-07-26 (×4): 1 via TOPICAL
  Filled 2024-07-08 (×4): qty 60

## 2024-07-08 MED ORDER — GUAIFENESIN-DM 100-10 MG/5ML PO SYRP: 5.0000 mL | ORAL_SOLUTION | Freq: Four times a day (QID) | ORAL | Status: DC | PRN

## 2024-07-08 MED ORDER — FLEET ENEMA RE ENEM: 1.0000 | ENEMA | Freq: Once | RECTAL | Status: DC | PRN

## 2024-07-08 MED ORDER — LIDOCAINE 5 % EX PTCH
2.0000 | MEDICATED_PATCH | CUTANEOUS | Status: DC
  Administered 2024-07-08 – 2024-07-31 (×27): 2 via TRANSDERMAL
  Filled 2024-07-08 (×22): qty 2

## 2024-07-08 MED ORDER — ALBUTEROL SULFATE (2.5 MG/3ML) 0.083% IN NEBU
2.5000 mg | INHALATION_SOLUTION | RESPIRATORY_TRACT | Status: DC | PRN
  Administered 2024-07-13 – 2024-07-18 (×2): 2.5 mg via RESPIRATORY_TRACT
  Filled 2024-07-08 (×2): qty 3

## 2024-07-08 MED ORDER — ADULT MULTIVITAMIN W/MINERALS CH
1.0000 | ORAL_TABLET | Freq: Every day | ORAL | Status: DC
  Administered 2024-07-09 – 2024-07-24 (×15): 1
  Filled 2024-07-08 (×15): qty 1

## 2024-07-08 MED ORDER — ESCITALOPRAM OXALATE 10 MG PO TABS
40.0000 mg | ORAL_TABLET | Freq: Every day | ORAL | Status: DC
  Administered 2024-07-09 – 2024-07-24 (×15): 40 mg
  Filled 2024-07-08 (×15): qty 4

## 2024-07-08 MED ORDER — REVEFENACIN 175 MCG/3ML IN SOLN
175.0000 ug | Freq: Every day | RESPIRATORY_TRACT | Status: DC
  Administered 2024-07-09 – 2024-08-01 (×20): 175 ug via RESPIRATORY_TRACT
  Filled 2024-07-08 (×22): qty 3

## 2024-07-08 MED ORDER — INSULIN ASPART 100 UNIT/ML IJ SOLN: 0.0000 [IU] | INTRAMUSCULAR | Status: DC

## 2024-07-08 MED ORDER — LIDOCAINE 5 % EX OINT: TOPICAL_OINTMENT | Freq: Every day | CUTANEOUS | 0 refills | Status: DC | PRN

## 2024-07-08 MED ORDER — PROCHLORPERAZINE 25 MG RE SUPP: 12.5000 mg | Freq: Four times a day (QID) | RECTAL | Status: DC | PRN

## 2024-07-08 MED ORDER — PROSOURCE TF20 ENFIT COMPATIBL EN LIQD
60.0000 mL | Freq: Every day | ENTERAL | Status: DC
  Administered 2024-07-09 – 2024-08-01 (×26): 60 mL
  Filled 2024-07-08 (×22): qty 60

## 2024-07-08 MED ORDER — MELATONIN 3 MG PO TABS: 6.0000 mg | ORAL_TABLET | Freq: Every evening | ORAL | 0 refills | Status: DC | PRN

## 2024-07-08 MED ORDER — ORAL CARE MOUTH RINSE
15.0000 mL | OROMUCOSAL | Status: DC | PRN
Start: 2024-07-08 — End: 2024-08-01

## 2024-07-08 MED ORDER — PROCHLORPERAZINE MALEATE 5 MG PO TABS: 5.0000 mg | ORAL_TABLET | Freq: Four times a day (QID) | ORAL | Status: DC | PRN

## 2024-07-08 MED ORDER — TRAMADOL HCL 50 MG PO TABS
50.0000 mg | ORAL_TABLET | Freq: Two times a day (BID) | ORAL | Status: DC | PRN
  Administered 2024-07-09: 50 mg via ORAL
  Filled 2024-07-08 (×2): qty 1

## 2024-07-08 MED ORDER — NITROGLYCERIN 0.4 MG SL SUBL: 0.4000 mg | SUBLINGUAL_TABLET | SUBLINGUAL | Status: DC | PRN

## 2024-07-08 MED ORDER — BISACODYL 10 MG RE SUPP
10.0000 mg | Freq: Every day | RECTAL | Status: DC | PRN
  Administered 2024-07-12: 10 mg via RECTAL
  Filled 2024-07-08: qty 1

## 2024-07-08 MED ORDER — MELATONIN 5 MG PO TABS
5.0000 mg | ORAL_TABLET | Freq: Every day | ORAL | Status: DC
  Administered 2024-07-08 – 2024-07-31 (×27): 5 mg
  Filled 2024-07-08 (×24): qty 1

## 2024-07-08 MED ORDER — POLYETHYLENE GLYCOL 3350 17 G PO PACK: 17.0000 g | PACK | Freq: Every day | ORAL | Status: DC | PRN

## 2024-07-08 MED ORDER — NITROGLYCERIN 0.4 MG SL SUBL: 0.4000 mg | SUBLINGUAL_TABLET | SUBLINGUAL | 12 refills | Status: AC | PRN

## 2024-07-08 MED ORDER — ACETAMINOPHEN 500 MG PO TABS
500.0000 mg | ORAL_TABLET | Freq: Three times a day (TID) | ORAL | Status: DC
  Administered 2024-07-08 – 2024-07-24 (×61): 500 mg
  Filled 2024-07-08 (×62): qty 1

## 2024-07-08 MED ORDER — WHITE PETROLATUM EX OINT: TOPICAL_OINTMENT | CUTANEOUS | Status: DC | PRN

## 2024-07-08 MED ORDER — DICLOFENAC SODIUM 1 % EX GEL: 2.0000 g | Freq: Four times a day (QID) | CUTANEOUS | 0 refills | Status: DC

## 2024-07-08 MED ORDER — ASPIRIN 81 MG PO CHEW
81.0000 mg | CHEWABLE_TABLET | Freq: Every day | ORAL | Status: DC
  Administered 2024-07-09 – 2024-07-24 (×15): 81 mg
  Filled 2024-07-08 (×15): qty 1

## 2024-07-08 MED ORDER — ALUM & MAG HYDROXIDE-SIMETH 200-200-20 MG/5ML PO SUSP: 30.0000 mL | ORAL | Status: DC | PRN

## 2024-07-08 MED ORDER — ORAL CARE MOUTH RINSE: 15.0000 mL | OROMUCOSAL | Status: DC

## 2024-07-08 MED ORDER — PHENOL 1.4 % MT LIQD: 2.0000 | OROMUCOSAL | Status: DC | PRN

## 2024-07-08 NOTE — Progress Notes (Signed)
 Spoke with admitting NP for CIR.  Leave IV in place at discharge

## 2024-07-08 NOTE — Progress Notes (Signed)
 Orthopedic Tech Progress Note Patient Details:  [name] December 28, 1931 [id number]  Called in order to [employer] for a REHAB COMBO, (RESTING WHO, WRIST COCK UP, PRAFO BOOT)   Patient ID: [name], female   DOB: 05-Dec-1932, 89 y.o.   MRN: [id number]  [name] Pac 07/08/2024, 5:33 PM

## 2024-07-08 NOTE — Progress Notes (Signed)
 Nutrition Follow-up  DOCUMENTATION CODES:  Non-severe (moderate) malnutrition in context of social or environmental circumstances  INTERVENTION:  Continue the following via cortrak:  Osmolite 1.5 at goal rate of 40 ml/hr (960 ml per day) Prosource TF20 60 ml daily Provides 1520 kcal, 80 gm protein, 732 ml free water  daily MVI with minerals daily  New measured weight  NUTRITION DIAGNOSIS:  Moderate Malnutrition related to social / environmental circumstances as evidenced by moderate fat depletion, mild muscle depletion. - remains applicable  GOAL:  Patient will meet greater than or equal to 90% of their needs - progressing  MONITOR:  I & O's, Diet advancement  REASON FOR ASSESSMENT:  Ventilator    ASSESSMENT:  Pt with PMH of HLD, PAD, COPD with emphysema, OSA, R breast ca s/p lumpectomy/XRT/hormone therapy admitted 7/5 for RUQ pain, N/V, and poor PO intake. Dx with acalculous cholecystitis and E.coli bacteremia.   7/5 - admitted; clears 7/8 - diet advanced to fulls; MRCP poor study, HIDA negative fro cholecystitis 7/9 - tx to ICU and intubated; dx with R MCA stroke s/p TNK, IR for mechanical thrombectomy which reoccluded 7/10- extubated; awaiting SLP eval 7/11 - MBS, NPO recommended, cortrak placed 7/16 - SLP BSE, NPO 7/21 - SLP BSE, NPO but planning for MBS 7/22 - MBS, NPO  Pt resting in bed at the time of assessment. No family present but pt alert and answering questions although answers not reliable. Pt underwent MBS this AM, not able to be put on diet and exhibited gross aspiration of all liquids.   Discussed with SLP and MD, SLP hopeful that with time and increased alertness pt will have been luck doing exercises to strengthen and improve swallow. However, did mention that pt may benefit from having PEG placed to support her during recovery period and for increased comfort with getting NGT out.   Pt has been approved for CIR and per RN will be transferred today.  Tolerating TF well, will continue current regimen to be adjusted as needed once pt is at CIR.  Admit weight: 67.1 kg  Current weight: 71.7 kg    Nutritionally Relevant Medications: Scheduled Meds:  atorvastatin   10 mg Per Tube Daily   PROSource TF20  60 mL Per Tube Daily   multivitamin with minerals  1 tablet Per Tube Daily   Continuous Infusions:  feeding supplement (OSMOLITE 1.5 CAL) 1,000 mL (07/07/24 2315)   PRN Meds: ondansetron , phenol, polyethylene glycol  Labs Reviewed: CBG ranges from 92-127 mg/dL over the last 24 hours HgbA1c 5.1% (7/10)  Micronutrient Profile: Vitamin B12 941 Folate 16.0  NUTRITION - FOCUSED PHYSICAL EXAM: Flowsheet Row Most Recent Value  Orbital Region Moderate depletion  Upper Arm Region Unable to assess  Thoracic and Lumbar Region Moderate depletion  Buccal Region Unable to assess  Temple Region Mild depletion  Clavicle Bone Region Mild depletion  Clavicle and Acromion Bone Region Mild depletion  Scapular Bone Region Unable to assess  Dorsal Hand Unable to assess  Patellar Region Mild depletion  Anterior Thigh Region Mild depletion  Posterior Calf Region Moderate depletion  Edema (RD Assessment) Mild  Hair Reviewed  Eyes Reviewed  Mouth Unable to assess  Skin Reviewed  Nails Unable to assess    Diet Order:   Diet Order             Diet NPO time specified Except for: Ice Chips  Diet effective now  EDUCATION NEEDS:  Education needs have been addressed  Skin:  Skin Assessment: Reviewed RN Assessment  Last BM:  7/20 - type 6  Height:  Ht Readings from Last 1 Encounters:  06/21/24 5' 1 (1.549 m)    Weight:  Wt Readings from Last 1 Encounters:  06/29/24 71.7 kg    BMI:  Body mass index is 29.87 kg/m.  Estimated Nutritional Needs:  Kcal:  1400-1700 kcal/d Protein:  70-85 g/d Fluid:  >1.6 L/day    Vernell Lukes, RD, LDN, CNSC Registered Dietitian II Please reach out via secure chat

## 2024-07-08 NOTE — TOC Transition Note (Signed)
 Transition of Care [facility]) - Discharge Note   Patient Details  Name: [name] MRN: [id number] Date of Birth: Jan 26, 1932  Transition of [facility]) CM/SW Contact:  Andrez JULIANNA George, RN Phone Number: 07/08/2024, 11:34 AM   Clinical Narrative:     Pt is discharging to CIR. No further needs per IP Care Management.  Final next level of care: IP Rehab Facility Barriers to Discharge: No Barriers Identified   Patient Goals and CMS Choice Patient states their goals for this hospitalization and ongoing recovery are:: to return home CMS Medicare.gov Compare Post Acute Care list provided to:: Patient Represenative (must comment) Choice offered to / list presented to : Adult Children [place] ownership interest in [facility].provided to:: Adult Children    Discharge Placement                       Discharge Plan and Services Additional resources added to the After Visit Summary for     Discharge Planning Services: CM Consult Post Acute Care Choice: Skilled Nursing Facility                    HH Arranged: RN, OT, PT, Nurse's Aide HH Agency: [facility] Date [facility] Contacted: 06/25/24 Time HH Agency Contacted: 1007 Representative spoke with at [facility] Agency: Darleene  Social Drivers of Health (SDOH) Interventions SDOH Screenings   Food Insecurity: No Food Insecurity (06/22/2024)  Housing: Low Risk  (06/22/2024)  Transportation Needs: No Transportation Needs (06/22/2024)  Utilities: Not At Risk (06/22/2024)  Social Connections: Socially Isolated (06/22/2024)  Tobacco Use: Medium Risk (06/21/2024)     Readmission Risk Interventions     No data to display

## 2024-07-08 NOTE — Plan of Care (Signed)
  Problem: Education: Goal: Knowledge of General Education information will improve Description: Including pain rating scale, medication(s)/side effects and non-pharmacologic comfort measures Outcome: Progressing   Problem: Health Behavior/Discharge Planning: Goal: Ability to manage health-related needs will improve Outcome: Progressing   Problem: Clinical Measurements: Goal: Ability to maintain clinical measurements within normal limits will improve Outcome: Progressing Goal: Will remain free from infection Outcome: Progressing Goal: Diagnostic test results will improve Outcome: Progressing Goal: Respiratory complications will improve Outcome: Progressing Goal: Cardiovascular complication will be avoided Outcome: Progressing   Problem: Activity: Goal: Risk for activity intolerance will decrease Outcome: Progressing   Problem: Nutrition: Goal: Adequate nutrition will be maintained Outcome: Progressing   Problem: Coping: Goal: Level of anxiety will decrease Outcome: Progressing   Problem: Elimination: Goal: Will not experience complications related to bowel motility Outcome: Progressing Goal: Will not experience complications related to urinary retention Outcome: Progressing   Problem: Pain Managment: Goal: General experience of comfort will improve and/or be controlled Outcome: Progressing   Problem: Safety: Goal: Ability to remain free from injury will improve Outcome: Progressing   Problem: Skin Integrity: Goal: Risk for impaired skin integrity will decrease Outcome: Progressing   Problem: Education: Goal: Knowledge of disease or condition will improve Outcome: Progressing Goal: Knowledge of secondary prevention will improve (MUST DOCUMENT ALL) Outcome: Progressing Goal: Knowledge of patient specific risk factors will improve (DELETE if not current risk factor) Outcome: Progressing   Problem: Ischemic Stroke/TIA Tissue Perfusion: Goal: Complications of  ischemic stroke/TIA will be minimized Outcome: Progressing   Problem: Coping: Goal: Will verbalize positive feelings about self Outcome: Progressing Goal: Will identify appropriate support needs Outcome: Progressing   Problem: Health Behavior/Discharge Planning: Goal: Ability to manage health-related needs will improve Outcome: Progressing Goal: Goals will be collaboratively established with patient/family Outcome: Progressing   Problem: Self-Care: Goal: Ability to participate in self-care as condition permits will improve Outcome: Progressing Goal: Verbalization of feelings and concerns over difficulty with self-care will improve Outcome: Progressing Goal: Ability to communicate needs accurately will improve Outcome: Progressing   Problem: Nutrition: Goal: Risk of aspiration will decrease Outcome: Progressing Goal: Dietary intake will improve Outcome: Progressing   Problem: Activity: Goal: Ability to tolerate increased activity will improve Outcome: Progressing   Problem: Respiratory: Goal: Ability to maintain a clear airway and adequate ventilation will improve Outcome: Progressing   Problem: Role Relationship: Goal: Method of communication will improve Outcome: Progressing   Problem: Education: Goal: Understanding of CV disease, CV risk reduction, and recovery process will improve Outcome: Progressing Goal: Individualized Educational Video(s) Outcome: Progressing   Problem: Activity: Goal: Ability to return to baseline activity level will improve Outcome: Progressing   Problem: Cardiovascular: Goal: Ability to achieve and maintain adequate cardiovascular perfusion will improve Outcome: Progressing Goal: Vascular access site(s) Level 0-1 will be maintained Outcome: Progressing   Problem: Health Behavior/Discharge Planning: Goal: Ability to safely manage health-related needs after discharge will improve Outcome: Progressing

## 2024-07-08 NOTE — Progress Notes (Signed)
 Arrived to CIR today. Accompanied by Daughter [name]. Discussed unit scheduling, protocols, and alarms. Admission complete.    Geni Armor, LPN

## 2024-07-08 NOTE — Progress Notes (Signed)
 Modified Barium Swallow Study  Patient Details  Name: [name] MRN: [id number] Date of Birth: Nov 19, 1932  Today's Date: 07/08/2024  Modified Barium Swallow completed.  Full report located under Chart Review in the Imaging Section.  History of Present Illness [name] is an 89 yo female presenting to ED 7/5 for RUQ pain, nausea/vomiting. Found to have suspected cholecystitis vs passed choledocholithiasis and acute liver injury, hyponatremia, bacteriuria, and hematuria. Initially seen at [facility] ED 7/3 with n/v and elevated LFTs but left without being seen. Daughter noted sudden onset AMS and L sided weakness 7/9 with CTA showing abrupt occlusion of distal R MCA M1 s/p TNK. Taken to IR with unsuccessful revascularization (repeated reocclusion), post-procedure CTH showed small SAH. Remained intubated after the procedure, 7/9-7/10. PMH includes HLD, PAD, COPD with emphysema, OSA, R breast cancer, back pain, esophageal spasm   Clinical Impression Pt exhibits severe oropharyngeal dysphagia, which is considered slightly worse compared to previous MBS 7/11 but is suspected to primarily be impacted by the amount of trials observed today. She was alert and participatory but delays related to initiation continue to result in gross aspiration of thin, nectar, and honey thick liquids most notably after the swallow secondary to reduced control of oral residuals as they progress through the pharynx. While she senses all aspiration and attempts to cough forcefully, it is ineffective at clearing the entire volume of the aspirated bolus (PAS 7). She was able to follow commands to attempt compensatory strategies but a chin tuck posture was ineffective with both thin and nectar thick liquids. Biofeedback was beneficial in cueing her to use a more timely subswallow but this cannot consistently be utilized clinically. There was less residue with purees and no penetration/aspiration. Recommend she remain NPO except  ice chips after oral care. Plan to f/u to provide education to pt and her daughters. Will continue following to target ESMT and use of prompt subswallows with trials of puree in an effort to make this an effective way to prevent aspiration with liquids.  Factors that may increase risk of adverse event in presence of aspiration [name] [employer] [name] 2021): Reduced cognitive function;Limited mobility;Presence of tubes (ETT, trach, NG, etc.);Frequent aspiration of large volumes  Swallow Evaluation Recommendations Recommendations: NPO;Ice chips PRN after oral care Medication Administration: Via alternative means Oral care recommendations: Oral care QID (4x/day);Oral care before ice chips/water  Recommended consults: Consider Palliative care    Damien Blumenthal, M.A., CCC-SLP Speech Language Pathology, Acute Rehabilitation Services  Secure Chat preferred [phone]  07/08/2024,11:03 AM

## 2024-07-08 NOTE — Progress Notes (Signed)
 Cornelio Bouchard, MD  Physician Physical Medicine and Rehabilitation   PMR Pre-admission    Signed   Date of Service: 07/04/2024  1:04 PM  Related encounter: ED to Hosp-Admission (Discharged) from 06/21/2024 in [facility] [facility]   Signed     Expand All Collapse All  Show:Clear all [x] Written[x] Templated[] Copied  Added by: [x] Alison, Heron MATSU, RN[x] Conetta, Kristyn H[x] Yvone Delayne Tinnie SHAUNNA, CCC-SLP  [] Hover for details PMR Admission Coordinator Pre-Admission Assessment   Patient: [name] is an 89 y.o., female MRN: [id number] DOB: August 31, 1932 Height: 5' 1 (154.9 cm) Weight: 71.7 kg                                                                                                                                      Insurance Information HMO:     PPO: yes     PCP:      IPA:      80/20:      OTHER:  PRIMARY: [employer] Choice PPO      Policy#: [id number]  [facility] group # [id number]    Subscriber: patient CM Name: Luke Barefoot      Phone#: [phone] ext [phone]     Fax#: [phone] Pre-Cert#: [id number] approved for 7 days 7/22 until 7/29 with flu Loraine phone [phone] fax [phone]      Employer:  Benefits:  Phone #: [phone]     Name: 7/21 Eff. Date: 12/19/23-still active     Deduct: does not have      Out of Pocket Max: $4,000 ($460.44 met)       CIR: $160/day co pay with max copay of $1,600 (10 days)      SNF: $0/day for first 20 days Outpatient: $20/co pay per visit      Home Health: 100% coverage       DME: 80% coverage     Providers: in-network SECONDARY:       Policy#:       Phone#:    Artist:       Phone#:    The Engineer, materials Information Summary" for patients in Inpatient Rehabilitation Facilities with attached "Privacy Act Statement-Health Care Records" was provided and verbally reviewed with: Patient and Family   Emergency Contact Information Contact Information       Name Relation Home  Work Rapelje Daughter [phone]   [phone]    Lazaro Pals Daughter     [phone]    Nita Collar Daughter     [phone]         Other Contacts   None on File      Current Medical History  Patient Admitting Diagnosis:Right MCA CVA   History of Present Illness: Pt is an 89 year old female with medical hx significant for: COPD, depression/anxiety, breast CA. Pt presented to [facility] on 06/21/24 d/t 1 week h/o nausea and  vomiting as well as generalized weakness. Pt presented to [facility] on 7/3 but left prior to being seen d/t wait times. Lab work demonstrated acute liver injury. PCP referred her to [facility].    Bacteria and positive nitrates on UA; started on IV antibiotics. CT abdomen/pelvis showed gallbladder distention with trace pericholecystic fluid versus wall thickening; ultrasound of RUQ ordered. Imaging negative for cholelithiasis or choledocholithiasis. Left hepatic lobe lesion noted, thought to be hemangioma. Surgery consulted. Blood cultures positive for E.coli and Enterobacterales. HIDA negative. No surgical interventions warranted.    Code stroke activated 06/25/24. CT head negative. CTA head/neck showed right MCA M1 occlusion. TNK administered. Pt taken emergently to IR for thrombectomy. Revascularization was completed but repeatedly re occluded. Post procedure CT showed small SAH. MRI showed small volume SAH. Pt extubated on 06/26/24. Cortrak placed 06/27/24.    Now on ASA given SAH. Will add Plavix for no further procedures are planned. Home meds Atorvastatin  with LDL 46. Now on Lipitor.On no HTN meds at home. Prn Labetalol  and hydralazine  added. MBS 7/22 results pending. On CPAP at home pta but reported not using effectively. Lower back pain on Tylenol  and Flexeril  as needed. Added Tramadol  also.    Complete NIHSS TOTAL: 18 Glasgow Coma Scale Score: 15   Patient's medical record from [facility] has been reviewed by the  rehabilitation admission coordinator and physician.    Past Medical History      Past Medical History:  Diagnosis Date   Anxiety     Back pain     Cancer (HCC)     Depression     Emphysema     Esophageal spasm     Lung nodule     OSA (obstructive sleep apnea)     Osteomalacia     Personal history of radiation therapy     Vertigo     Vitamin D  deficiency          Has the patient had major surgery during 100 days prior to admission? Yes   Family History  family history includes Breast cancer in her maternal aunt; Cancer in her paternal uncle; Diabetes type II in her sister and another family member; Hypertension in an other family member; Other in her father and mother.   Current Medications   Current Medications    Current Facility-Administered Medications:    acetaminophen  (TYLENOL ) tablet 650 mg, 650 mg, Oral, Q4H PRN, 650 mg at 07/05/24 1430 **OR** acetaminophen  (TYLENOL ) 160 MG/5ML solution 650 mg, 650 mg, Per Tube, Q4H PRN, 650 mg at 07/06/24 1415 **OR** acetaminophen  (TYLENOL ) suppository 650 mg, 650 mg, Rectal, Q4H PRN, Deveshwar, Sanjeev, MD   acetaminophen -codeine  (TYLENOL  #3) 300-30 MG per tablet 1 tablet, 1 tablet, Per Tube, Q6H PRN, Waddell Aquas A, NP, 1 tablet at 07/07/24 2254   albuterol  (PROVENTIL ) (2.5 MG/3ML) 0.083% nebulizer solution 2.5 mg, 2.5 mg, Nebulization, Q4H PRN, Segars, Jonathan, MD, 2.5 mg at 06/25/24 0314   artificial tears ophthalmic solution 1 drop, 1 drop, Both Eyes, PRN, Waddell Aquas A, NP   aspirin  chewable tablet 81 mg, 81 mg, Per Tube, Daily, Jerri Pfeiffer, MD, 81 mg at 07/08/24 9066   atorvastatin  (LIPITOR) tablet 10 mg, 10 mg, Per Tube, Daily, Jerri Pfeiffer, MD, 10 mg at 07/08/24 0931   Chlorhexidine  Gluconate Cloth 2 % PADS 6 each, 6 each, Topical, Daily, Lindzen, Eric, MD, 6 each at 07/08/24 0931   cyclobenzaprine  (FLEXERIL ) tablet 5 mg, 5 mg, Per Tube, Q8H PRN, Jerri Pfeiffer, MD, 5  mg at 07/07/24 0143   diclofenac  Sodium (VOLTAREN ) 1 %  topical gel 2 g, 2 g, Topical, QID, Sethi, Pramod S, MD, 2 g at 07/08/24 9068   escitalopram  (LEXAPRO ) tablet 40 mg, 40 mg, Per Tube, Daily, Ilah Krabbe M, PA-C, 40 mg at 07/08/24 0931   feeding supplement (OSMOLITE 1.5 CAL) liquid 1,000 mL, 1,000 mL, Per Tube, Continuous, Sethi, Pramod S, MD, Last Rate: 40 mL/hr at 07/07/24 2315, 1,000 mL at 07/07/24 2315   feeding supplement (PROSource TF20) liquid 60 mL, 60 mL, Per Tube, Daily, Jerri Pfeiffer, MD, 60 mL at 07/08/24 0931   Gerhardt's butt cream, , Topical, TID, Sethi, Pramod S, MD, Given at 07/08/24 0931   heparin  injection 5,000 Units, 5,000 Units, Subcutaneous, Q8H, Jerri Pfeiffer, MD, 5,000 Units at 07/08/24 0511   hydrALAZINE  (APRESOLINE ) injection 5-10 mg, 5-10 mg, Intravenous, Q4H PRN, Toberman, Stevi W, NP   labetalol  (NORMODYNE ) injection 5-10 mg, 5-10 mg, Intravenous, Q4H PRN, Toberman, Stevi W, NP   lidocaine  (XYLOCAINE ) 5 % ointment, , Topical, Daily PRN, Khaliqdina, Salman, MD   melatonin tablet 6 mg, 6 mg, Per Tube, QHS PRN, Ilah Krabbe HERO, PA-C, 6 mg at 07/07/24 0143   multivitamin with minerals tablet 1 tablet, 1 tablet, Per Tube, Daily, Jerri Pfeiffer, MD, 1 tablet at 07/08/24 0931   nitroGLYCERIN  (NITROSTAT ) SL tablet 0.4 mg, 0.4 mg, Sublingual, Q5 min PRN, Sheikh, Omair Latif, DO   ondansetron  (ZOFRAN ) injection 4 mg, 4 mg, Intravenous, Q6H PRN, Segars, Jonathan, MD, 4 mg at 07/04/24 2057   Oral care mouth rinse, 15 mL, Mouth Rinse, 4 times per day, Jerri Pfeiffer, MD, 15 mL at 07/08/24 0749   Oral care mouth rinse, 15 mL, Mouth Rinse, PRN, Jerri Pfeiffer, MD   phenol (CHLORASEPTIC) mouth spray 2 spray, 2 spray, Mouth/Throat, PRN, Waddell Aquas A, NP, 2 spray at 07/03/24 1404   polyethylene glycol (MIRALAX  / GLYCOLAX ) packet 17 g, 17 g, Per Tube, Daily PRN, Ilah Krabbe M, PA-C, 17 g at 06/29/24 9147   revefenacin  (YUPELRI ) nebulizer solution 175 mcg, 175 mcg, Nebulization, Daily, Ilah Krabbe M, PA-C, 175 mcg at 07/08/24  9189   sodium chloride  flush (NS) 0.9 % injection 3 mL, 3 mL, Intravenous, Q12H, Segars, Dorn, MD, 3 mL at 07/08/24 0932   traMADol  (ULTRAM ) tablet 50 mg, 50 mg, Oral, Q12H PRN, Jerri Pfeiffer, MD, 50 mg at 07/07/24 1414   white petrolatum  (VASELINE) gel, , Topical, PRN, Merilee Linsey I, RPH     Patients Current Diet:  Diet Order                  Diet NPO time specified Except for: Ice Chips  Diet effective now                       Precautions / Restrictions Precautions Precautions: Fall Precaution/Restrictions Comments: Cortrak Restrictions Weight Bearing Restrictions Per Provider Order: No    Has the patient had 2 or more falls or a fall with injury in the past year?No   Prior Activity Level Community (5-7x/wk): gets out of house ~3-4 days/week   Prior Functional Level Prior Function Prior Level of Function : Independent/Modified Independent, Needs assist Mobility Comments: use of RW or cane in the home ADLs Comments: PCA prepared breakfaast/lunch, laundry, set-up for medication management, grocery shopping, supervises showers, drives to appts. Pt otherwise mod I in ADL   Self Care: Did the patient need help bathing, dressing, using the toilet or eating?  Independent   Indoor Mobility: Did the patient need assistance with walking from room to room (with or without device)? Independent   Stairs: Did the patient need assistance with internal or external stairs (with or without device)? Independent   Functional Cognition: Did the patient need help planning regular tasks such as shopping or remembering to take medications? Independent   Patient Information Are you of Hispanic, Latino/a,or Spanish origin?: X. Patient unable to respond, A. No, not of Hispanic, Latino/a, or Spanish origin What is your race?: X. Patient unable to respond, A. White Do you need or want an interpreter to communicate with a doctor or health care staff?: 9. Unable to respond   Patient's  Response To:  Health Literacy and Transportation Is the patient able to respond to health literacy and transportation needs?: Yes Health Literacy - How often do you need to have someone help you when you read instructions, pamphlets, or other written material from your doctor or pharmacy?: Sometimes In the past 12 months, has lack of transportation kept you from medical appointments or from getting medications?: No In the past 12 months, has lack of transportation kept you from meetings, work, or from getting things needed for daily living?: No   Home Assistive Devices / Equipment Home Equipment: Agricultural consultant (2 wheels), Rollator (4 wheels), [employer] - single point, [employer] - built in, [employer] - tub/shower, Hand held shower head, Grab bars - toilet, Transport chair   Prior Device Use: Indicate devices/aids used by the patient prior to current illness, exacerbation or injury? Walker and cane   Current Functional Level Cognition   Arousal/Alertness: Awake/alert Overall Cognitive Status: Impaired/Different from baseline Orientation Level: Oriented X4 Attention: Sustained Sustained Attention: Impaired Sustained Attention Impairment: Verbal basic, Functional basic Memory: Impaired Memory Impairment: Decreased recall of new information Awareness: Impaired Awareness Impairment: Intellectual impairment Problem Solving: Impaired Problem Solving Impairment: Functional basic    Extremity Assessment (includes Sensation/Coordination)   Upper Extremity Assessment: Right hand dominant RUE Deficits / Details: Grossly 3+/5 MMT RUE Sensation: WNL RUE Coordination: WNL LUE Deficits / Details: Pt with trace shoulder adduction, possibly just tone given inconsistency BUT pt also very fatigued and hard to discern. LUE Sensation: decreased light touch, decreased proprioception LUE Coordination: decreased fine motor, decreased gross motor  Lower Extremity Assessment: Defer to PT evaluation RLE  Deficits / Details: moving against gravity; grossly 3+ to 4 throughout LLE Deficits / Details: able to wiggle toes, AAROM knee flexion and extension LLE Sensation: decreased light touch     ADLs   Overall ADL's : Needs assistance/impaired Eating/Feeding: NPO Eating/Feeding Details (indicate cue type and reason): assistance with opening containers Grooming: Sitting, Wash/dry face, Minimal assistance Grooming Details (indicate cue type and reason): supported sitting in recliner, min A for throughness. Pt washing L side of face with RUE. Upper Body Bathing: Total assistance, Sitting Upper Body Bathing Details (indicate cue type and reason): on EOB Lower Body Bathing: Total assistance, Bed level Upper Body Dressing : Total assistance Upper Body Dressing Details (indicate cue type and reason): donned gown to cover back Lower Body Dressing: Total assistance, Bed level Lower Body Dressing Details (indicate cue type and reason): patient able to doff socks with mod assist to donn s Toilet Transfer: Total assistance Toilet Transfer Details (indicate cue type and reason): simulated with transfer from recliner to bed. Toileting- Clothing Manipulation and Hygiene: Total assistance, Bed level Toileting - Clothing Manipulation Details (indicate cue type and reason): to bring depends underwear around hips Functional  mobility during ADLs: Contact guard assist, Rolling walker (2 wheels) General ADL Comments: Pt needing total A for ADLs and transfers at this time. Limited initation for ADLs and very lethargic to perform much.     Mobility   Overal bed mobility: Needs Assistance Bed Mobility: Sit to Supine Rolling: Mod assist, Max assist, Used rails Supine to sit: Max assist, +2 for physical assistance, +2 for safety/equipment Sit to supine: Total assist, Used rails General bed mobility comments: Cues for pt to utilize bed rail with RUE and pull/shift weight R to address L lean.     Transfers   Overall  transfer level: Needs assistance Equipment used: 2 person hand held assist Transfers: Sit to/from Stand, Bed to chair/wheelchair/BSC Sit to Stand: Total assist Bed to/from chair/wheelchair/BSC transfer type:: Stand pivot Stand pivot transfers: Total assist Step pivot transfers: Contact guard assist General transfer comment: Attempted to use stedy but pt unable to hold anterior lean without significant Ext support and has very little strength to initiate offloading bottom.     Ambulation / Gait / Stairs / Wheelchair Mobility   Ambulation/Gait Ambulation/Gait assistance: Clinical research associate (Feet): 100 Feet Assistive device: Rolling walker (2 wheels) Gait Pattern/deviations: Step-to pattern, Decreased stride length General Gait Details: short step length, 2 standing rest breaks Gait velocity: dec Gait velocity interpretation: <1.31 ft/sec, indicative of household ambulator     Posture / Balance Dynamic Sitting Balance Sitting balance - Comments: max A for sitting balance due to L lateral lean, pt using R rail to hold onto and correct lean slightly. Balance Overall balance assessment: Needs assistance Sitting-balance support: Feet supported, Single extremity supported, No upper extremity supported Sitting balance-Leahy Scale: Poor Sitting balance - Comments: max A for sitting balance due to L lateral lean, pt using R rail to hold onto and correct lean slightly. Postural control: Left lateral lean Standing balance support: Bilateral upper extremity supported, During functional activity, Reliant on assistive device for balance Standing balance-Leahy Scale: Zero Standing balance comment: total A     Special needs/care consideration Skin Abrasion: lip/bilateral; Ecchymosis: lip/bilateral; Erythema/Redness: buttocks, sacrum/bilateral, Bowel and Bladder incontinence, External Urinary Catheter, Cortrak    Previous Home Environment  Living Arrangements: Alone  Lives With:  Alone Available Help at Discharge:  (caregiver, Doyal , would increase hrs from 5 to 8 and family would cover to provide 24/7 physical assist) Type of Home:  (town house) Home Layout: One level Home Access: Level entry [employer] Shower/Tub: Psychologist, counselling, Door [employer] Toilet: Handicapped height Bathroom Accessibility: Yes How Accessible: Accessible via walker, Accessible via wheelchair Home Care Services: Yes Type of Home Care Services: Homehealth aide (5 hrs per day  during the weekday) Home Care Agency (if known): private   Discharge Living Setting Plans for Discharge Living Setting: Patient's home, Other (Comment), Alone (townhouse) Type of Home at Discharge:  (townhome) Discharge Home Layout: One level Discharge Home Access: Level entry Discharge Bathroom Shower/Tub: Walk-in shower Discharge Bathroom Toilet: Handicapped height Discharge Bathroom Accessibility: Yes How Accessible: Accessible via walker, Accessible via wheelchair Does the patient have any problems obtaining your medications?: No   Social/Family/Support Systems Patient Roles: Parent Contact Information: [name], main contact Anticipated Caregiver: daughters and caregiver, Doyal Anticipated Industrial/product designer Information: [phone] Caregiver Availability: 24/7 Discharge Plan Discussed with Primary Caregiver: Yes Is Caregiver In Agreement with Plan?: Yes Does Caregiver/Family have Issues with Lodging/Transportation while Pt is in Rehab?: Yes   Goals Patient/Family Goal for Rehab: min to mod assist at wheelchair level for PT  and OT, min for SLP Expected length of stay: ELOS 16 to 20 days Pt/Family Agrees to Admission and willing to participate: Yes Program Orientation Provided & Reviewed with Pt/Caregiver Including Roles  & Responsibilities: Yes   Decrease burden of Care through IP rehab admission: NA   Possible need for SNF placement upon discharge: Possible if patient does not reach a wheelchair level  that family felt they could manage. I discussed with dtr, Randall, on 7/18, the difficulty in obtaining insurance Auth for SNF after a CIR admit as well as dtrs [name] and [place] on 7/22.    Patient Condition: This patient's condition remains as documented in the consult dated 07/02/24, in which the Rehabilitation Physician determined and documented that the patient's condition is appropriate for intensive rehabilitative care in an inpatient rehabilitation facility pending improvement in tolerance with therapy. These areas have been addressed.  Will admit to inpatient rehab today.   Preadmission Screen Completed By:  Heron Leavell RN MSN, 07/08/2024 11:35 AM ______________________________________________________________________   Discussed status with Dr. Cornelio on 07/08/24 at 1136 and received approval for admission today.   Admission Coordinator: Heron Leavell RN MSN, time [id number] Date 07/08/24            Revision History

## 2024-07-08 NOTE — Progress Notes (Signed)
 EKG for baseline showed progressive prolongation in Qtc. On exam patient alert and asymptomatic with repeat EKG showing resolution with Qtc 416.  Discussed with Dr. Sheena who reviewed medications and recommended repeat EKG in am due to improvement in Qtc. No offending medications found--she is off Zofran  with prn compazine  which was preferred. Daughter in room updated. EKG ordered for am.

## 2024-07-08 NOTE — Progress Notes (Signed)
 Pt transferred to CIR via bed with NTx2.  Pt's daughter has all personal belongings and went to CIR with the pt

## 2024-07-08 NOTE — Progress Notes (Signed)
 Urbano Albright, MD  Physician Physical Medicine and Rehabilitation   Consult Note    Signed   Date of Service: 07/02/2024 10:43 AM  Related encounter: ED to Hosp-Admission (Discharged) from 06/21/2024 in [place] [facility]   Signed     Expand All Collapse All  Show:Clear all [x] Written[x] Templated[] Copied  Added by: [x] Urbano Albright, MD  [] Hover for details          Physical Medicine and Rehabilitation Consult Reason for Consult:Rehab Referring Physician: Dr. Rosemarie     HPI: [name] is a 89 y.o. female with past medical history of PAD, hyperlipidemia, OSA, COPD, right breast carcinoma s/p lumpectomy, hormone therapy and radiation, depression who is admitted after she developed nausea, vomiting and had elevated LFTs was treated for acalculous cholecystitis with antibiotics.  LFTs improving.  During hospitalization patient developed respiratory insufficiency that improved with diuretics.  On 7/9 patient developed left-sided weakness, right gaze deviation, altered mental status and slurred speech.  She was found to have right MCA territory infarct with right MCA M1 occlusion. She had TNK and taken to IR for thrombectomy with TICI 2B revascularization.  Patient was extubated 7/10.  Core track was placed on 7/11.  She is followed by speech therapy for dysphagia and communication deficits.  Patient evaluated by PT and OT and found to have functional deficits and intensive inpatient rehabilitation recommended.   Per chart review patient lives in a one-story level entry home with personal care attendant 5 days a week and family can assist. Uses RW at home.    Home: Home Living Family/patient expects to be discharged to:: Private residence Living Arrangements: Alone Available Help at Discharge: Family, Personal care attendant (5 days/week 5 hours/day 10-3) Type of Home: Other(Comment) (condo) Home Access: Level entry Home Layout: One level Bathroom  Shower/Tub: Psychologist, counselling, Door Bathroom Toilet: Handicapped height Home Equipment: Agricultural consultant (2 wheels), Rollator (4 wheels), [employer] - single point, Information systems manager - built in, [employer] - tub/shower, Hand held shower head, Grab bars - toilet, Transport chair  Functional History: Prior Function Prior Level of Function : Independent/Modified Independent, Needs assist Mobility Comments: use of RW in the home ADLs Comments: PCA prepared breakfaast/lunch, laundry, set-up for medication management, grocery shopping, supervises showers, drives to appts. Pt otherwise mod I in ADL Functional Status:  Mobility: Bed Mobility Overal bed mobility: Needs Assistance Bed Mobility: Supine to Sit, Sit to Supine, Rolling Rolling: Mod assist, Max assist, Used rails Supine to sit: Max assist, +2 for physical assistance Sit to supine: Max assist, +2 for physical assistance General bed mobility comments: patient requiring assistance with BLEs and trunk with use of bed pad for scooting towards EOB Transfers Overall transfer level: Needs assistance Equipment used: 2 person hand held assist Transfers: Sit to/from Stand Sit to Stand: Max assist, +2 physical assistance Bed to/from chair/wheelchair/BSC transfer type:: Step pivot Step pivot transfers: Contact guard assist General transfer comment: Stood x2 from EOB with max assist +2 for power up and L knee block. cues and assistance for upright posture. Ambulation/Gait Ambulation/Gait assistance: Contact guard assist Gait Distance (Feet): 100 Feet Assistive device: Rolling walker (2 wheels) Gait Pattern/deviations: Step-to pattern, Decreased stride length General Gait Details: short step length, 2 standing rest breaks Gait velocity: dec Gait velocity interpretation: <1.31 ft/sec, indicative of household ambulator   ADL: ADL Overall ADL's : Needs assistance/impaired Eating/Feeding: NPO Eating/Feeding Details (indicate cue type and reason): assistance with  opening containers Grooming: Wash/dry face, Minimal assistance, Sitting  Grooming Details (indicate cue type and reason): on EOB Upper Body Bathing: Maximal assistance, Bed level Lower Body Bathing: Total assistance, Bed level Upper Body Dressing : Maximal assistance, Bed level Upper Body Dressing Details (indicate cue type and reason): donned gown to cover back Lower Body Dressing: Total assistance, Bed level Lower Body Dressing Details (indicate cue type and reason): patient able to doff socks with mod assist to donn s Toilet Transfer: Contact guard assist, Ambulation, Rolling walker (2 wheels), BSC/3in1 Toilet Transfer Details (indicate cue type and reason): simulated Toileting- Clothing Manipulation and Hygiene: Total assistance, Bed level Toileting - Clothing Manipulation Details (indicate cue type and reason): to bring depends underwear around hips Functional mobility during ADLs: Contact guard assist, Rolling walker (2 wheels) General ADL Comments: focused on sitting balance   Cognition: Cognition Overall Cognitive Status: Impaired/Different from baseline Arousal/Alertness: Awake/alert Orientation Level: Oriented X4 Attention: Sustained Sustained Attention: Impaired Sustained Attention Impairment: Verbal basic, Functional basic Memory: Impaired Memory Impairment: Decreased recall of new information Awareness: Impaired Awareness Impairment: Intellectual impairment Problem Solving: Impaired Problem Solving Impairment: Functional basic Cognition Arousal: Alert Behavior During Therapy: Flat affect Overall Cognitive Status: Impaired/Different from baseline     Review of Systems  Constitutional: Negative.   HENT:  Negative for congestion.   Respiratory:  Negative for shortness of breath.   Cardiovascular:  Negative for chest pain.  Gastrointestinal:  Negative for abdominal pain.  Genitourinary:  Negative for dysuria.  Musculoskeletal:  Positive for back pain and joint pain.   Skin:  Negative for rash.  Neurological:  Positive for sensory change, weakness and headaches.       Past Medical History:  Diagnosis Date   Anxiety     Back pain     Cancer (HCC)     Depression     Emphysema     Esophageal spasm     Lung nodule     OSA (obstructive sleep apnea)     Osteomalacia     Personal history of radiation therapy     Vertigo     Vitamin D  deficiency               Past Surgical History:  Procedure Laterality Date   ABDOMINAL HYSTERECTOMY       APPENDECTOMY       BREAST BIOPSY Right 08/31/2020   BREAST EXCISIONAL BIOPSY Left 1978   BREAST LUMPECTOMY Right 09/30/2020   BREAST LUMPECTOMY WITH RADIOACTIVE SEED LOCALIZATION Right 09/30/2020    Procedure: RIGHT BREAST LUMPECTOMY WITH RADIOACTIVE SEED LOCALIZATION;  Surgeon: Curvin Deward MOULD, MD;  Location: [facility];  Service: General;  Laterality: Right;   CARPAL TUNNEL RELEASE       IR CT HEAD LTD   06/25/2024   IR PERCUTANEOUS ART THROMBECTOMY/INFUSION INTRACRANIAL INC DIAG ANGIO   06/25/2024   RADIOLOGY WITH ANESTHESIA N/A 06/25/2024    Procedure: RADIOLOGY WITH ANESTHESIA;  Surgeon: Dolphus Carrion, MD;  Location: [facility];  Service: Radiology;  Laterality: N/A;   ROTATOR CUFF REPAIR                 Family History  Problem Relation Age of Onset   Diabetes type II Other     Hypertension Other     Breast cancer Maternal Aunt          dx early 37s   Other Mother          died of natural causes at age 41   Other Father  stroke or heart attack while in shower   Diabetes type II Sister     Cancer Paternal Uncle          unknown type; dx early 77s        Social History:  reports that she quit smoking about 8 years ago. Her smoking use included cigarettes. She started smoking about 64 years ago. She has a 56 pack-year smoking history. She has never used smokeless tobacco. She reports that she does not drink alcohol  and does not use drugs. Allergies:  Allergies        Allergies  Allergen Reactions   Adhesive [Tape] Itching and Other (See Comments)      Little red itchy bumps develop   Ativan  [Lorazepam ] Anxiety and Other (See Comments)      Family states makes her go crazy   Latex Itching and Other (See Comments)      Little red itchy bumps develop            Medications Prior to Admission  Medication Sig Dispense Refill   acetaminophen  (TYLENOL ) 650 MG CR tablet Take 650 mg by mouth 3 (three) times daily as needed for pain.       albuterol  (PROVENTIL  HFA;VENTOLIN  HFA) 108 (90 Base) MCG/ACT inhaler Inhale 2 puffs into the lungs every 6 (six) hours as needed for wheezing or shortness of breath.       aspirin  EC 81 MG tablet Take 81 mg by mouth daily.       atorvastatin  (LIPITOR) 10 MG tablet Take 10 mg by mouth at bedtime.       Cholecalciferol  (VITAMIN D ) 2000 units tablet Take 4,000 Units by mouth daily.        escitalopram  (LEXAPRO ) 20 MG tablet Take 40 mg by mouth daily.   2   Multiple Vitamin (MULTIVITAMIN WITH MINERALS) TABS tablet Take 1 tablet by mouth daily.       pantoprazole  (PROTONIX ) 40 MG tablet Take 40 mg by mouth daily.       Polyethyl Glycol-Propyl Glycol (SYSTANE) 0.4-0.3 % SOLN Place 1 drop into both eyes daily as needed (dry eyes).       tiotropium (SPIRIVA) 18 MCG inhalation capsule Place 18 mcg into inhaler and inhale daily.       WELLBUTRIN  XL 150 MG 24 hr tablet 1 tablet in the morning Orally Once a day                Blood pressure (!) 119/43, pulse (!) 59, temperature 99 F (37.2 C), resp. rate 18, height 5' 1 (1.549 m), weight 71.7 kg, SpO2 99%. Physical Exam   General: No apparent distress, appears fatigued and uncomfortable HEENT: Head is normocephalic, atraumatic, sclera anicteric, oral mucosa dry, [place] in pace,  dentition decreased Neck: Supple without JVD or lymphadenopathy Heart: Bradycardic Chest: CTA bilaterally without wheezes, rales, or rhonchi; no distress Abdomen: Soft, non-tender, non-distended,  bowel sounds positive. Extremities: No clubbing, cyanosis, or edema. Pulses are 2+ Psych: Pt's affect is flat Skin: Clean and intact without signs of breakdown Neuro:     Mental Status: Right gaze deviation/neglect, alert and oriented x3,  delayed responses, able to recall 1/3 words 5 minutes later Speech/Languate: Naming and repetition intact, fluent, follows simple commands, , speaks in 1-2 word answers, limited verbal output CRANIAL NERVES: II: Left homonymous hemianopsia III, IV, VI: EOM intact, no gaze preference or deviation V: reports intact to b/l Face, but inconsistent responses VII: Left facial weakness VIII: hard of hearing  IX, X: normal palatal elevation XI: head midline XII: Tongue midline     MOTOR: Required a lot of encouragement for participation RUE: Grossly moving spontaneously to gravity but provides minimal force during manual motor testing LUE: Flaccid RLE: Hip flexion 2-3 out of 5, distally 3-4 out of 5 LLE: withdraws to pain   Increased tone noted knee flexors   SENSORY: Decreased sensation LUE and LLE   Coordination: unable to assess       Lab Results Last 24 Hours       Results for orders placed or performed during the hospital encounter of 06/21/24 (from the past 24 hours)  Glucose, capillary     Status: Abnormal    Collection Time: 07/01/24 12:53 PM  Result Value Ref Range    Glucose-Capillary 132 (H) 70 - 99 mg/dL    Comment 1 Notify RN      Comment 2 Document in Chart    Glucose, capillary     Status: Abnormal    Collection Time: 07/01/24  8:01 PM  Result Value Ref Range    Glucose-Capillary 117 (H) 70 - 99 mg/dL  Glucose, capillary     Status: Abnormal    Collection Time: 07/02/24 12:39 AM  Result Value Ref Range    Glucose-Capillary 134 (H) 70 - 99 mg/dL  Glucose, capillary     Status: Abnormal    Collection Time: 07/02/24  2:57 AM  Result Value Ref Range    Glucose-Capillary 136 (H) 70 - 99 mg/dL  Glucose, capillary     Status:  Abnormal    Collection Time: 07/02/24  8:17 AM  Result Value Ref Range    Glucose-Capillary 138 (H) 70 - 99 mg/dL    Comment 1 Notify RN        Imaging Results (Last 48 hours)  No results found.     Assessment/Plan: Diagnosis: Right MCA CVA likely due to large vessel disease Does the need for close, 24 hr/day medical supervision in concert with the patient's rehab needs make it unreasonable for this patient to be served in a less intensive setting? Yes Co-Morbidities requiring supervision/potential complications:  - Possible prior CVA, acute acalculous cholecystitis, E. coli bacteremia, elevated LFTs, hypertension, hyperlipidemia, postop respiratory insufficiency, dysphagia, CAD/PAD, obstructive sleep apnea on CPAP, depression, low back pain Due to bladder management, bowel management, safety, skin/wound care, disease management, medication administration, pain management, and patient education, does the patient require 24 hr/day rehab nursing? Yes Does the patient require coordinated care of a physician, rehab nurse, therapy disciplines of Pt/OT/SLP to address physical and functional deficits in the context of the above medical diagnosis(es)? Yes Addressing deficits in the following areas: balance, endurance, locomotion, strength, transferring, bowel/bladder control, bathing, dressing, feeding, grooming, toileting, cognition, speech, language, swallowing, and psychosocial support Can the patient actively participate in an intensive therapy program of at least 3 hrs of therapy per day at least 5 days per week? Potentially The potential for patient to make measurable gains while on inpatient rehab is excellent Anticipated functional outcomes upon discharge from inpatient rehab are mod assist  with PT, mod assist with OT, min assist and mod assist with SLP. Estimated rehab length of stay to reach the above functional goals is: 16-20 Anticipated discharge destination: Home Overall  Rehab/Functional Prognosis: good   POST ACUTE RECOMMENDATIONS: This patient's condition is appropriate for continued rehabilitative care in the following setting: CIR and SNF Patient has agreed to participate in recommended program. Potentially Note that insurance prior authorization may  be required for reimbursement for recommended care.   Comment: I think patient would be a candidate for CIR versus SNF.  Not sure that she would be able to tolerate 3 hours a day at this time at this time but could potentially be an option for CIR if she shows progression/good participation with therapy.     MEDICAL RECOMMENDATIONS: Consider PRAFO/WHO for  LUE/LLE     I have personally performed a face to face diagnostic evaluation of this patient. Additionally, I have examined the patient's medical record including any pertinent labs and radiographic images.     Thanks,   Murray Collier, MD 07/02/2024           Routing History

## 2024-07-08 NOTE — Progress Notes (Signed)
  Inpatient Rehabilitation Admissions Coordinator   We have received insurance approval and CIR bed to move her to today. I contacted [place], [place], by phone and she is in agreement. I will meet her at 1 pm to complete admission paperwork. Acute team and TOC made aware.  Heron Leavell, RN, MSN Rehab Admissions Coordinator [phone] 07/08/2024 11:31 AM

## 2024-07-08 NOTE — TOC Progression Note (Signed)
 Transition of Care [facility]) - Progression Note    Patient Details  Name: [name] MRN: [id number] Date of Birth: 02-Dec-1932  Transition of [facility] CM/SW Contact  Andrez JULIANNA George, RN Phone Number: 07/08/2024, 10:48 AM  Clinical Narrative:     CIR started insurance for rehab admission. IP Care Management following.  Expected Discharge Plan: IP Rehab Facility Barriers to Discharge: Continued Medical Work up, English as a second language teacher, SNF Pending bed offer  Expected Discharge Plan and Services   Discharge Planning Services: CM Consult Post Acute Care Choice: Skilled Nursing Facility Living arrangements for the past 2 months: Single Family Home                           HH Arranged: RN, OT, PT, Nurse's Aide HH Agency: [facility] Date [facility] Agency Contacted: 06/25/24 Time HH Agency Contacted: 1007 Representative spoke with at [facility] Agency: Darle[place]   Social Determinants of Health (SDOH) Interventions SDOH Screenings   Food Insecurity: No Food Insecurity (06/22/2024)  Housing: Low Risk  (06/22/2024)  Transportation Needs: No Transportation Needs (06/22/2024)  Utilities: Not At Risk (06/22/2024)  Social Connections: Socially Isolated (06/22/2024)  Tobacco Use: Medium Risk (06/21/2024)    Readmission Risk Interventions     No data to display

## 2024-07-08 NOTE — H&P (Incomplete)
 Physical Medicine and Rehabilitation Admission H&P    Chief Complaint  Patient presents with   Functional decline due to stroke and multiple medical issues.     HPI:  [name]. [name] is an 89 year old female with history of OSA, macular degeneration, MDD, dysphagia, COPD/emphysema,  right breast cancer s/p lumpectomy/XRT, who was admitted to  [facility] on 06/21/24 with 3-4 day hx of RUQ abdominal pain w/N/V/D, reports of fever and abnormal LFTs. She was felt to have possible acalculous cholecystitis and started on IVF and antibiotics. MRCP 07/06 was negative for CBD stones, dilatation or acute cholecystitis and showed probable hemangioma R-hepatic lobe. Noted to have positive UTI. Blood cultures positive for  E coli in 1/4 bottles and maintained on ceftriaxone /Flagyl . On 07/08, she developed DOE due to fluid overload w/BNP 622 as well as hypokalemia with K -2.9 which was treated with IV diuresis as well as runs of K. 2 D echo done revealing EF 60-65% with  G2DD   On 07/09 pm, she was found to have AMS with left facial droop, right gaze deviation, left sided weakness and slurred speech. CTA head/neck showed abrupt occlusion of distal M1 segment of R-MCA, TNK administered and she underwent cerebral angio with endovascular TICI 2 B revascularization of R-MCA by Dr. Dolphus. Use of rescue stent deferred due to due to concerns of increased risk of intracranial hemorrhage w/DAPT.  Post procedure CT head with small SAH and tolerated extubation by 07/10. MRI brain done revealing evolving R-MCA infarct with mild associated petechial blood products right frontoparietal region and small volume acute SAH base of sylvian fissure. Dr. Jerri felt that stroke likely due to large vessel disease and on ASA given SAH.  NPO recommended and cortak placed for nutritional support. MBS done revealing moderate oropharyngeal dysphagia likely impacted by cervical osteophytes and prominent cricopharyngeus and NPO recommended.  PEG  has been discussed with family by Dr. Rosemarie and issues with nausea treated with decrease in rate of TF. Repeat MBS 07/22 showed severe oropharyngeal dysphagia with delay in initiation and gross aspiration and inability to clear aspirate--to continue NPO except ice chips.  Hospital course also significant for encephalopathy with hallucinations, back not relived by flexeril  and tramadol  added. Voltaren  gel added for knee pain, sleep wake disruption,   Patient with resultant  left facial droop with dysarthria, dysphagia, dense LUE>LLE weakness with sensory deficits, left hemianopsia with no blink to threat and able to follow one step commands with multimodal cues and increased time. She lives alone and ambulated with use of RW due to hx of truncal ataxia. Had aide 5 hrs/day for home/medication management and family plans on providing 24/7 care after discharge. Therapy has been working with patient who is showing improvement in alertness, working on sitting balance at EOB as well as standing attempts with left knee blocked. She requires max to total assist overall and CIR recommended due to functional decline.    ROS   Past Medical History:  Diagnosis Date   Anxiety    Back pain    Cancer (HCC)    Depression    Emphysema    Esophageal spasm    Lung nodule    OSA (obstructive sleep apnea)    Osteomalacia    Personal history of radiation therapy    Vertigo    Vitamin D  deficiency     Past Surgical History:  Procedure Laterality Date   ABDOMINAL HYSTERECTOMY     APPENDECTOMY     BREAST  BIOPSY Right 08/31/2020   BREAST EXCISIONAL BIOPSY Left 1978   BREAST LUMPECTOMY Right 09/30/2020   BREAST LUMPECTOMY WITH RADIOACTIVE SEED LOCALIZATION Right 09/30/2020   Procedure: RIGHT BREAST LUMPECTOMY WITH RADIOACTIVE SEED LOCALIZATION;  Surgeon: Curvin Deward MOULD, MD;  Location: [facility];  Service: General;  Laterality: Right;   CARPAL TUNNEL RELEASE     IR CT HEAD LTD  06/25/2024   IR  PERCUTANEOUS ART THROMBECTOMY/INFUSION INTRACRANIAL INC DIAG ANGIO  06/25/2024   RADIOLOGY WITH ANESTHESIA N/A 06/25/2024   Procedure: RADIOLOGY WITH ANESTHESIA;  Surgeon: Dolphus Carrion, MD;  Location: [facility];  Service: Radiology;  Laterality: N/A;   ROTATOR CUFF REPAIR     Family History  Problem Relation Age of Onset   Diabetes type II Other    Hypertension Other    Breast cancer Maternal Aunt        dx early 46s   Other Mother        died of natural causes at age 34   Other Father        stroke or heart attack while in shower   Diabetes type II Sister    Cancer Paternal Uncle        unknown type; dx early 60s    Social History: Lives alone and independent PTA. Per  reports that she quit smoking about 8 years ago. Her smoking use included cigarettes. She started smoking about 64 years ago. She has a 56 pack-year smoking history. She has never used smokeless tobacco. She reports that she does not drink alcohol  and does not use drugs.   Allergies  Allergen Reactions   Adhesive [Tape] Itching and Other (See Comments)    Little red itchy bumps develop   Ativan  [Lorazepam ] Anxiety and Other (See Comments)    Family states makes her go crazy   Latex Itching and Other (See Comments)    Little red itchy bumps develop    Medications Prior to Admission  Medication Sig Dispense Refill   acetaminophen  (TYLENOL ) 650 MG CR tablet Take 650 mg by mouth 3 (three) times daily as needed for pain.     albuterol  (PROVENTIL  HFA;VENTOLIN  HFA) 108 (90 Base) MCG/ACT inhaler Inhale 2 puffs into the lungs every 6 (six) hours as needed for wheezing or shortness of breath.     aspirin  EC 81 MG tablet Take 81 mg by mouth daily.     atorvastatin  (LIPITOR) 10 MG tablet Take 10 mg by mouth at bedtime.     Cholecalciferol  (VITAMIN D ) 2000 units tablet Take 4,000 Units by mouth daily.      escitalopram  (LEXAPRO ) 20 MG tablet Take 40 mg by mouth daily.  2   Multiple Vitamin (MULTIVITAMIN WITH MINERALS) TABS  tablet Take 1 tablet by mouth daily.     pantoprazole  (PROTONIX ) 40 MG tablet Take 40 mg by mouth daily.     Polyethyl Glycol-Propyl Glycol (SYSTANE) 0.4-0.3 % SOLN Place 1 drop into both eyes daily as needed (dry eyes).     tiotropium (SPIRIVA) 18 MCG inhalation capsule Place 18 mcg into inhaler and inhale daily.     WELLBUTRIN  XL 150 MG 24 hr tablet 1 tablet in the morning Orally Once a day      Home: Home Living Family/patient expects to be discharged to:: Private residence Living Arrangements: Alone Available Help at Discharge:  (caregiver, Doyal , would increase hrs from 5 to 8 and family would cover to provide 24/7 physical assist) Type of Home:  (town house)  Home Access: Level entry Home Layout: One level Bathroom Shower/Tub: Psychologist, counselling, Door Bathroom Toilet: Handicapped height Bathroom Accessibility: Yes Home Equipment: Agricultural consultant (2 wheels), Rollator (4 wheels), [employer] - single point, Information systems manager - built in, [employer] - tub/shower, Hand held shower head, Grab bars - toilet, Transport chair  Lives With: Alone   Functional History: Prior Function Prior Level of Function : Independent/Modified Independent, Needs assist Mobility Comments: use of RW or cane in the home ADLs Comments: PCA prepared breakfaast/lunch, laundry, set-up for medication management, grocery shopping, supervises showers, drives to appts. Pt otherwise mod I in ADL  Functional Status:  Mobility: Bed Mobility Overal bed mobility: Needs Assistance Bed Mobility: Sit to Supine Rolling: Mod assist, Max assist, Used rails Supine to sit: Max assist, +2 for physical assistance, +2 for safety/equipment Sit to supine: Total assist, Used rails General bed mobility comments: Cues for pt to utilize bed rail with RUE and pull/shift weight R to address L lean. Transfers Overall transfer level: Needs assistance Equipment used: 2 person hand held assist Transfers: Sit to/from Stand, Bed to  chair/wheelchair/BSC Sit to Stand: Total assist Bed to/from chair/wheelchair/BSC transfer type:: Stand pivot Stand pivot transfers: Total assist Step pivot transfers: Contact guard assist General transfer comment: Attempted to use stedy but pt unable to hold anterior lean without significant Ext support and has very little strength to initiate offloading bottom. Ambulation/Gait Ambulation/Gait assistance: Contact guard assist Gait Distance (Feet): 100 Feet Assistive device: Rolling walker (2 wheels) Gait Pattern/deviations: Step-to pattern, Decreased stride length General Gait Details: short step length, 2 standing rest breaks Gait velocity: dec Gait velocity interpretation: <1.31 ft/sec, indicative of household ambulator    ADL: ADL Overall ADL's : Needs assistance/impaired Eating/Feeding: NPO Eating/Feeding Details (indicate cue type and reason): assistance with opening containers Grooming: Sitting, Wash/dry face, Minimal assistance Grooming Details (indicate cue type and reason): supported sitting in recliner, min A for throughness. Pt washing L side of face with RUE. Upper Body Bathing: Total assistance, Sitting Upper Body Bathing Details (indicate cue type and reason): on EOB Lower Body Bathing: Total assistance, Bed level Upper Body Dressing : Total assistance Upper Body Dressing Details (indicate cue type and reason): donned gown to cover back Lower Body Dressing: Total assistance, Bed level Lower Body Dressing Details (indicate cue type and reason): patient able to doff socks with mod assist to donn s Toilet Transfer: Total assistance Toilet Transfer Details (indicate cue type and reason): simulated with transfer from recliner to bed. Toileting- Clothing Manipulation and Hygiene: Total assistance, Bed level Toileting - Clothing Manipulation Details (indicate cue type and reason): to bring depends underwear around hips Functional mobility during ADLs: Contact guard assist,  Rolling walker (2 wheels) General ADL Comments: Pt needing total A for ADLs and transfers at this time. Limited initation for ADLs and very lethargic to perform much.  Cognition: Cognition Overall Cognitive Status: Impaired/Different from baseline Arousal/Alertness: Awake/alert Orientation Level: Oriented X4 Attention: Sustained Sustained Attention: Impaired Sustained Attention Impairment: Verbal basic, Functional basic Memory: Impaired Memory Impairment: Decreased recall of new information Awareness: Impaired Awareness Impairment: Intellectual impairment Problem Solving: Impaired Problem Solving Impairment: Functional basic Cognition Arousal: Lethargic Behavior During Therapy: Flat affect Overall Cognitive Status: Impaired/Different from baseline  Physical Exam: Blood pressure (!) 122/41, pulse (!) 57, temperature 97.8 F (36.6 C), resp. rate 19, height 5' 1 (1.549 m), weight 71.7 kg, SpO2 99%. Physical Exam  Results for orders placed or performed during the hospital encounter of 06/21/24 (from the past 48 hours)  Glucose, capillary     Status: Abnormal   Collection Time: 07/06/24 11:48 AM  Result Value Ref Range   Glucose-Capillary 117 (H) 70 - 99 mg/dL    Comment: Glucose reference range applies only to samples taken after fasting for at least 8 hours.   Comment 1 Notify RN   Glucose, capillary     Status: Abnormal   Collection Time: 07/06/24  4:05 PM  Result Value Ref Range   Glucose-Capillary 110 (H) 70 - 99 mg/dL    Comment: Glucose reference range applies only to samples taken after fasting for at least 8 hours.   Comment 1 Notify RN   Glucose, capillary     Status: None   Collection Time: 07/06/24  7:32 PM  Result Value Ref Range   Glucose-Capillary 95 70 - 99 mg/dL    Comment: Glucose reference range applies only to samples taken after fasting for at least 8 hours.   Comment 1 Notify RN    Comment 2 Document in Chart   Glucose, capillary     Status: Abnormal    Collection Time: 07/06/24 11:31 PM  Result Value Ref Range   Glucose-Capillary 103 (H) 70 - 99 mg/dL    Comment: Glucose reference range applies only to samples taken after fasting for at least 8 hours.  Basic metabolic panel with GFR     Status: Abnormal   Collection Time: 07/07/24  4:50 AM  Result Value Ref Range   Sodium 136 135 - 145 mmol/L   Potassium 4.7 3.5 - 5.1 mmol/L   Chloride 99 98 - 111 mmol/L   CO2 25 22 - 32 mmol/L   Glucose, Bld 118 (H) 70 - 99 mg/dL    Comment: Glucose reference range applies only to samples taken after fasting for at least 8 hours.   BUN 26 (H) 8 - 23 mg/dL   Creatinine, Ser 9.33 0.44 - 1.00 mg/dL   Calcium  8.9 8.9 - 10.3 mg/dL   GFR, Estimated >39 >39 mL/min    Comment: (NOTE) Calculated using the CKD-EPI Creatinine Equation (2021)    Anion gap 12 5 - 15    Comment: Performed at [facility], [address] [place], [place] [address]  CBC     Status: Abnormal   Collection Time: 07/07/24  4:50 AM  Result Value Ref Range   WBC 6.9 4.0 - 10.5 K/uL   RBC 3.32 (L) 3.87 - 5.11 MIL/uL   Hemoglobin 10.3 (L) 12.0 - 15.0 g/dL   HCT 68.2 (L) 63.9 - 53.9 %   MCV 95.5 80.0 - 100.0 fL   MCH 31.0 26.0 - 34.0 pg   MCHC 32.5 30.0 - 36.0 g/dL   RDW 86.0 88.4 - 84.4 %   Platelets 275 150 - 400 K/uL   nRBC 0.0 0.0 - 0.2 %    Comment: Performed at [facility], [address], [place], [place] [address]  Glucose, capillary     Status: Abnormal   Collection Time: 07/07/24  6:24 AM  Result Value Ref Range   Glucose-Capillary 123 (H) 70 - 99 mg/dL    Comment: Glucose reference range applies only to samples taken after fasting for at least 8 hours.   Comment 1 Notify RN    Comment 2 Document in Chart   Glucose, capillary     Status: Abnormal   Collection Time: 07/07/24  7:53 AM  Result Value Ref Range   Glucose-Capillary 120 (H) 70 - 99 mg/dL  Comment: Glucose reference range applies only to samples taken after fasting for at least 8 hours.    Comment 1 Notify RN    Comment 2 Document in Chart   Glucose, capillary     Status: None   Collection Time: 07/07/24  1:08 PM  Result Value Ref Range   Glucose-Capillary 92 70 - 99 mg/dL    Comment: Glucose reference range applies only to samples taken after fasting for at least 8 hours.   Comment 1 Notify RN   Glucose, capillary     Status: Abnormal   Collection Time: 07/07/24  4:01 PM  Result Value Ref Range   Glucose-Capillary 113 (H) 70 - 99 mg/dL    Comment: Glucose reference range applies only to samples taken after fasting for at least 8 hours.   Comment 1 Notify RN    Comment 2 Document in Chart   Glucose, capillary     Status: Abnormal   Collection Time: 07/07/24  7:29 PM  Result Value Ref Range   Glucose-Capillary 127 (H) 70 - 99 mg/dL    Comment: Glucose reference range applies only to samples taken after fasting for at least 8 hours.   Comment 1 Notify RN    Comment 2 Document in Chart   Glucose, capillary     Status: Abnormal   Collection Time: 07/07/24 11:34 PM  Result Value Ref Range   Glucose-Capillary 110 (H) 70 - 99 mg/dL    Comment: Glucose reference range applies only to samples taken after fasting for at least 8 hours.  Glucose, capillary     Status: Abnormal   Collection Time: 07/08/24  3:53 AM  Result Value Ref Range   Glucose-Capillary 112 (H) 70 - 99 mg/dL    Comment: Glucose reference range applies only to samples taken after fasting for at least 8 hours.   Comment 1 Notify RN    Comment 2 Document in Chart   Glucose, capillary     Status: Abnormal   Collection Time: 07/08/24  8:17 AM  Result Value Ref Range   Glucose-Capillary 120 (H) 70 - 99 mg/dL    Comment: Glucose reference range applies only to samples taken after fasting for at least 8 hours.   Comment 1 Notify RN    Comment 2 Document in Chart   Basic metabolic panel with GFR     Status: Abnormal   Collection Time: 07/08/24 10:40 AM  Result Value Ref Range   Sodium 138 135 - 145 mmol/L    Potassium 4.6 3.5 - 5.1 mmol/L   Chloride 99 98 - 111 mmol/L   CO2 27 22 - 32 mmol/L   Glucose, Bld 127 (H) 70 - 99 mg/dL    Comment: Glucose reference range applies only to samples taken after fasting for at least 8 hours.   BUN 22 8 - 23 mg/dL   Creatinine, Ser 9.28 0.44 - 1.00 mg/dL   Calcium  9.4 8.9 - 10.3 mg/dL   GFR, Estimated >39 >39 mL/min    Comment: (NOTE) Calculated using the CKD-EPI Creatinine Equation (2021)    Anion gap 12 5 - 15    Comment: Performed at [facility], [address], [place], [place] [address]  CBC     Status: Abnormal   Collection Time: 07/08/24 10:40 AM  Result Value Ref Range   WBC 6.9 4.0 - 10.5 K/uL   RBC 3.66 (L) 3.87 - 5.11 MIL/uL   Hemoglobin 11.2 (L) 12.0 - 15.0 g/dL  HCT 34.5 (L) 36.0 - 46.0 %   MCV 94.3 80.0 - 100.0 fL   MCH 30.6 26.0 - 34.0 pg   MCHC 32.5 30.0 - 36.0 g/dL   RDW 85.8 88.4 - 84.4 %   Platelets 286 150 - 400 K/uL   nRBC 0.0 0.0 - 0.2 %    Comment: Performed at [facility], [address] [place], [place] [address]   DG Swallowing Func-Speech Pathology Result Date: 07/08/2024 Table formatting from the original result was not included. Modified Barium Swallow Study Patient Details Name: SIENA POEHLER MRN: [id number] Date of Birth: January 11, 1932 Today's Date: 07/08/2024 HPI/PMH: HPI: ASHLEN KIGER is an 89 yo female presenting to ED 7/5 for RUQ pain, nausea/vomiting. Found to have suspected cholecystitis vs passed choledocholithiasis and acute liver injury, hyponatremia, bacteriuria, and hematuria. Initially seen at [facility] ED 7/3 with n/v and elevated LFTs but left without being seen. Daughter noted sudden onset AMS and L sided weakness 7/9 with CTA showing abrupt occlusion of distal R MCA M1 s/p TNK. Taken to IR with unsuccessful revascularization (repeated reocclusion), post-procedure CTH showed small SAH. Remained intubated after the procedure, 7/9-7/10. PMH includes HLD, PAD, COPD with emphysema, OSA, R breast cancer,  back pain, esophageal spasm Clinical Impression: Clinical Impression: Pt exhibits severe oropharyngeal dysphagia, which is considered slightly worse compared to previous MBS 7/11 but is suspected to primarily be impacted by the amount of trials observed today. She was alert and participatory but delays related to initiation continue to result in gross aspiration of thin, nectar, and honey thick liquids most notably after the swallow secondary to reduced control of oral residuals as they progress through the pharynx. While she senses all aspiration and attempts to cough forcefully, it is ineffective at clearing the entire volume of the aspirated bolus (PAS 7). She was able to follow commands to attempt compensatory strategies but a chin tuck posture was ineffective with both thin and nectar thick liquids. Biofeedback was beneficial in cueing her to use a more timely subswallow but this cannot consistently be utilized clinically. There was less residue with purees and no penetration/aspiration. Recommend she remain NPO except ice chips after oral care. Plan to f/u to provide education to pt and her daughters. Will continue following to target ESMT and use of prompt subswallows with trials of puree in an effort to make this an effective way to prevent aspiration with liquids. Factors that may increase risk of adverse event in presence of aspiration [name] & [name] 2021): Factors that may increase risk of adverse event in presence of aspiration [name] & [name] 2021): Reduced cognitive function; Limited mobility; Presence of tubes (ETT, trach, NG, etc.); Frequent aspiration of large volumes Recommendations/Plan: Swallowing Evaluation Recommendations Swallowing Evaluation Recommendations Recommendations: NPO; Ice chips PRN after oral care Medication Administration: Via alternative means Oral care recommendations: Oral care QID (4x/day); Oral care before ice chips/water  Recommended consults: Consider Palliative care  Treatment Plan Treatment Plan Treatment recommendations: Therapy as outlined in treatment plan below Follow-up recommendations: Acute inpatient rehab (3 hours/day) Functional status assessment: Patient has had a recent decline in their functional status and demonstrates the ability to make significant improvements in function in a reasonable and predictable amount of time. Treatment frequency: Min 2x/week Treatment duration: 2 weeks Interventions: Aspiration precaution training; Oropharyngeal exercises; Compensatory techniques; Patient/family education; Trials of upgraded texture/liquids Recommendations Recommendations for follow up therapy are one component of a multi-disciplinary discharge planning process, led by the attending physician.  Recommendations may be  updated based on patient status, additional functional criteria and insurance authorization. Assessment: Orofacial Exam: Orofacial Exam Oral Cavity: Oral Hygiene: WFL Oral Cavity - Dentition: Dentures, not available; Poor condition; Missing dentition Orofacial Anatomy: WFL Oral Motor/Sensory Function: Suspected cranial nerve impairment CN V - Trigeminal: WFL CN VII - Facial: Left motor impairment CN IX - Glossopharyngeal, CN X - Vagus: Left motor impairment CN XII - Hypoglossal: Left motor impairment Anatomy: Anatomy: Suspected cervical osteophytes; Prominent cricopharyngeus Boluses Administered: Boluses Administered Boluses Administered: Thin liquids (Level 0); Mildly thick liquids (Level 2, nectar thick); Moderately thick liquids (Level 3, honey thick); Puree  Oral Impairment Domain: Oral Impairment Domain Lip Closure: Escape progressing to mid-chin Tongue control during bolus hold: Posterior escape of less than half of bolus Bolus transport/lingual motion: Slow tongue motion Oral residue: Residue collection on oral structures Location of oral residue : Tongue; Palate Initiation of pharyngeal swallow : Pyriform sinuses  Pharyngeal Impairment Domain:  Pharyngeal Impairment Domain Soft palate elevation: No bolus between soft palate (SP)/pharyngeal wall (PW) Laryngeal elevation: Complete superior movement of thyroid  cartilage with complete approximation of arytenoids to epiglottic petiole Anterior hyoid excursion: Complete anterior movement Epiglottic movement: Partial inversion Laryngeal vestibule closure: Complete, no air/contrast in laryngeal vestibule Pharyngeal stripping wave : Present - complete Pharyngeal contraction (A/P view only): N/A Pharyngoesophageal segment opening: Partial distention/partial duration, partial obstruction of flow Tongue base retraction: Narrow column of contrast or air between tongue base and PPW Pharyngeal residue: Collection of residue within or on pharyngeal structures Location of pharyngeal residue: Tongue base; Valleculae; Pyriform sinuses  Esophageal Impairment Domain: No data recorded Pill: No data recorded Penetration/Aspiration Scale Score: Penetration/Aspiration Scale Score 1.  Material does not enter airway: Puree 7.  Material enters airway, passes BELOW cords and not ejected out despite cough attempt by patient: Thin liquids (Level 0); Mildly thick liquids (Level 2, nectar thick); Moderately thick liquids (Level 3, honey thick) Compensatory Strategies: Compensatory Strategies Compensatory strategies: Yes Chin tuck: Ineffective Ineffective Chin Tuck: Thin liquid (Level 0); Mildly thick liquid (Level 2, nectar thick)   General Information: Caregiver present: No  Diet Prior to this Study: NPO; Cortrak/Small bore NG tube   Temperature : Normal   Respiratory Status: WFL   Supplemental O2: None (Room air)   History of Recent Intubation: Yes  Behavior/Cognition: Alert; Cooperative; Requires cueing Self-Feeding Abilities: Needs assist with self-feeding Baseline vocal quality/speech: Dysphonic Volitional Cough: Able to elicit Volitional Swallow: Able to elicit Exam Limitations: No limitations Goal Planning: Prognosis for improved  oropharyngeal function: Fair Barriers to Reach Goals: Cognitive deficits; Severity of deficits No data recorded Patient/Family Stated Goal: continued improvement Consulted and agree with results and recommendations: Patient; Physician Pain: Pain Assessment Pain Assessment: Faces Faces Pain Scale: 4 Pain Location: all over Pain Descriptors / Indicators: Aching; Grimacing; Discomfort Pain Intervention(s): Monitored during session End of Session: Start Time:SLP Start Time (ACUTE ONLY): 0957 Stop Time: SLP Stop Time (ACUTE ONLY): 1021 Time Calculation:SLP Time Calculation (min) (ACUTE ONLY): 24 min Charges: SLP Evaluations $ SLP Speech Visit: 1 Visit SLP Evaluations $MBS Swallow: 1 Procedure $Swallowing Treatment: 1 Procedure $Speech Treatment for Individual: 1 Procedure SLP visit diagnosis: SLP Visit Diagnosis: Dysphagia, oropharyngeal phase (R13.12) Past Medical History: Past Medical History: Diagnosis Date  Anxiety   Back pain   Cancer (HCC)   Depression   Emphysema   Esophageal spasm   Lung nodule   OSA (obstructive sleep apnea)   Osteomalacia   Personal history of radiation therapy   Vertigo   Vitamin  D deficiency  Past Surgical History: Past Surgical History: Procedure Laterality Date  ABDOMINAL HYSTERECTOMY    APPENDECTOMY    BREAST BIOPSY Right 08/31/2020  BREAST EXCISIONAL BIOPSY Left 1978  BREAST LUMPECTOMY Right 09/30/2020  BREAST LUMPECTOMY WITH RADIOACTIVE SEED LOCALIZATION Right 09/30/2020  Procedure: RIGHT BREAST LUMPECTOMY WITH RADIOACTIVE SEED LOCALIZATION;  Surgeon: Curvin Deward MOULD, MD;  Location: [facility];  Service: General;  Laterality: Right;  CARPAL TUNNEL RELEASE    IR CT HEAD LTD  06/25/2024  IR PERCUTANEOUS ART THROMBECTOMY/INFUSION INTRACRANIAL INC DIAG ANGIO  06/25/2024  RADIOLOGY WITH ANESTHESIA N/A 06/25/2024  Procedure: RADIOLOGY WITH ANESTHESIA;  Surgeon: Dolphus Carrion, MD;  Location: [facility] OR;  Service: Radiology;  Laterality: N/A;  ROTATOR CUFF REPAIR   Damien Blumenthal, M.A.,  CCC-SLP Speech Language Pathology, Acute Rehabilitation Services Secure Chat preferred [phone] 07/08/2024, 11:13 AM     Blood pressure (!) 122/41, pulse (!) 57, temperature 97.8 F (36.6 C), resp. rate 19, height 5' 1 (1.549 m), weight 71.7 kg, SpO2 99%.  Medical Problem List and Plan: 1. Functional deficits secondary to ***  -patient may *** shower  -ELOS/Goals: *** 2.  Antithrombotics: -DVT/anticoagulation:  Pharmaceutical: Lovenox   -antiplatelet therapy: Continue ASA.  --Plavix recommended if no further procedures planned. Question need for PEG.  3. Pain Management: *** 4. Mood/Behavior/Sleep: LCSW to follow for evaluation and support.   --reports of hallucination/encephalopathy? Vitals at nigh limited per family request to decrease sleep wake disruption --Will order sleep wake chart to monitor sleep hygiene. Continue Melatonin 6 mg prn for insomnia.   -antipsychotic agents: N/A 5. Neuropsych/cognition: This patient *** capable of making decisions on *** own behalf. 6. Skin/Wound Care: *** 7. Fluids/Electrolytes/Nutrition: ***   E coli bacteremia: Treated with 10 day course of antibiotics. Ceftriaxone -->Duricef thru 07/14.  --hematuria has resolved.   Abnormal LFTs: Have resolved and Lipitor added on 07/19  COPD/OSA: Has CPAP but not using PTA. Continue Yupelri  nebs daily.   Hypomagnesemia:   Anemia:   Bradycardia: HR note to be in 51 to 61 range and had boderline prolonged Qtc on admission. Likely due to stroke.  --Check EKG for baseline.    Knee pain: Continue voltaren  gel qid w/ultram  prn    H/o MDD/anxiety: Stable on Lexapro .      ***  Sharlet GORMAN Schmitz, PA-C 07/08/2024

## 2024-07-08 NOTE — Plan of Care (Signed)
  Problem: Consults Goal: RH STROKE PATIENT EDUCATION Description: See Patient Education module for education specifics  Outcome: Progressing Goal: Nutrition Consult-if indicated Outcome: Progressing   Problem: RH BOWEL ELIMINATION Goal: RH STG MANAGE BOWEL WITH ASSISTANCE Description: STG Manage Bowel with min Assistance. Outcome: Progressing   Problem: RH BLADDER ELIMINATION Goal: RH STG MANAGE BLADDER WITH ASSISTANCE Description: STG Manage Bladder With min Assistance Outcome: Progressing   Problem: RH KNOWLEDGE DEFICIT Goal: RH STG INCREASE KNOWLEDGE OF DYSPHAGIA/FLUID INTAKE Description: Patient and family will be able to demonstrate understanding of dietary modifications to prevent complications related to stroke independently using handout provided.  Outcome: Progressing Goal: RH STG INCREASE KNOWLEGDE OF HYPERLIPIDEMIA Description: Patient and family will be able to demonstrate understanding of medication management and dietary modifications to better control cholesterol levels to prevent further occurrence of stroke independently using handout provided.  Outcome: Progressing Goal: RH STG INCREASE KNOWLEDGE OF STROKE PROPHYLAXIS Description: Patient and family will be able to demonstrate understanding of medication management and dietary modifications to better control cholesterol levels to prevent further occurrence of stroke independently using handout provided.  Outcome: Progressing   Problem: Education: Goal: Ability to describe self-care measures that may prevent or decrease complications (Diabetes Survival Skills Education) will improve Outcome: Progressing Goal: Individualized Educational Video(s) Outcome: Progressing   Problem: Coping: Goal: Ability to adjust to condition or change in health will improve Outcome: Progressing   Problem: Fluid Volume: Goal: Ability to maintain a balanced intake and output will improve Outcome: Progressing   Problem: Health  Behavior/Discharge Planning: Goal: Ability to identify and utilize available resources and services will improve Outcome: Progressing Goal: Ability to manage health-related needs will improve Outcome: Progressing   Problem: Metabolic: Goal: Ability to maintain appropriate glucose levels will improve Outcome: Progressing   Problem: Nutritional: Goal: Maintenance of adequate nutrition will improve Outcome: Progressing Goal: Progress toward achieving an optimal weight will improve Outcome: Progressing   Problem: Skin Integrity: Goal: Risk for impaired skin integrity will decrease Outcome: Progressing   Problem: Tissue Perfusion: Goal: Adequacy of tissue perfusion will improve Outcome: Progressing

## 2024-07-08 NOTE — Progress Notes (Signed)
 EKG results given to PA. PA at bedside.   Geni Armor, LPN

## 2024-07-08 NOTE — H&P (Signed)
 Physical Medicine and Rehabilitation Admission H&P    Chief Complaint  Patient presents with   Functional decline due to stroke     HPI:  [name] is an 89 year old R handed  female with history of OSA, macular degeneration, MDD, dysphagia, COPD/emphysema,  right breast cancer s/p lumpectomy/XRT, who was admitted to  [facility] on 06/21/24 with 3-4 day hx of RUQ abdominal pain w/N/V/D, reports of fever and abnormal LFTs. She was felt to have possible acalculous cholecystitis and started on IVF and antibiotics. MRCP 07/06 was negative for CBD stones, dilatation or acute cholecystitis and showed probable hemangioma R-hepatic lobe. Noted to have positive UTI. Blood cultures positive for  E coli in 1/4 bottles and maintained on ceftriaxone /Flagyl . On 07/08, she developed DOE due to fluid overload w/BNP 622 as well as hypokalemia with K -2.9 which was treated with IV diuresis as well as runs of K. 2 D echo done revealing EF 60-65% with  G2DD   On 07/09 pm, she was found to have AMS with left facial droop, right gaze deviation, left sided weakness and slurred speech. CTA head/neck showed abrupt occlusion of distal M1 segment of R-MCA, TNK administered and she underwent cerebral angio with endovascular TICI 2 B revascularization of R-MCA by Dr. Dolphus. Use of rescue stent deferred due to due to concerns of increased risk of intracranial hemorrhage w/DAPT.  Post procedure CT head with small SAH and tolerated extubation by 07/10. MRI brain done revealing evolving R-MCA infarct with mild associated petechial blood products right frontoparietal region and small volume acute SAH base of sylvian fissure. Dr. Jerri felt that stroke likely due to large vessel disease and on ASA given SAH.  NPO recommended and cortak placed for nutritional support. MBS done revealing moderate oropharyngeal dysphagia likely impacted by cervical osteophytes and prominent cricopharyngeus and NPO recommended.  PEG has been discussed  with family by Dr. Rosemarie and issues with nausea treated with decrease in rate of TF. Repeat MBS 07/22 showed severe oropharyngeal dysphagia with delay in initiation and gross aspiration and inability to clear aspirate--to continue NPO except ice chips.  Hospital course also significant for encephalopathy with hallucinations, back not relived by flexeril  and tramadol  added. Voltaren  gel added for knee pain, sleep wake disruption,   Patient with resultant  left facial droop with dysarthria, dysphagia, dense LUE>LLE weakness with sensory deficits, left hemianopsia with no blink to threat and able to follow one step commands with multimodal cues and increased time. She lives alone, was sedentary and ambulated with use of RW due to hx of truncal ataxia/ankle instability. Had aide 5 hrs/day for home/medication management and family plans on providing 24/7 care after discharge. Therapy has been working with patient who is showing improvement in alertness, working on sitting balance at EOB as well as standing attempts with left knee blocked. She requires max to total assist overall and CIR recommended due to functional decline.    Pt very sleepy after Flexeril  today-  having LBP- which has been worse since in hospital.  Took something for pain earlier, so is sleepy.  LBM this AM.   Review of Systems  Constitutional:  Negative for chills and fever.  HENT:  Positive for hearing loss.   Eyes:  Positive for blurred vision (centrall? uses glasses for a distance.).  Respiratory:  Negative for cough and shortness of breath.   Cardiovascular:  Negative for chest pain.  Gastrointestinal:  Negative for abdominal pain and constipation.  Genitourinary:  Negative for dysuria.  Musculoskeletal:  Positive for back pain (back spasms--chronic), falls (ankles tends to turn and cause falls), joint pain (right knee) and myalgias.  Skin:  Negative for rash.  Neurological:  Positive for dizziness (gets dizzy when she looks up),  sensory change, speech change and focal weakness.  Psychiatric/Behavioral:  The patient has insomnia (chronic).   All other systems reviewed and are negative.    Past Medical History:  Diagnosis Date   Anxiety    Back pain    Cancer (HCC)    Depression    Emphysema    Esophageal spasm    Lung nodule    OSA (obstructive sleep apnea)    Osteomalacia    Personal history of radiation therapy    Vertigo    Vitamin D  deficiency     Past Surgical History:  Procedure Laterality Date   ABDOMINAL HYSTERECTOMY     APPENDECTOMY     BREAST BIOPSY Right 08/31/2020   BREAST EXCISIONAL BIOPSY Left 1978   BREAST LUMPECTOMY Right 09/30/2020   BREAST LUMPECTOMY WITH RADIOACTIVE SEED LOCALIZATION Right 09/30/2020   Procedure: RIGHT BREAST LUMPECTOMY WITH RADIOACTIVE SEED LOCALIZATION;  Surgeon: Curvin Deward MOULD, MD;  Location: [facility];  Service: General;  Laterality: Right;   CARPAL TUNNEL RELEASE     IR CT HEAD LTD  06/25/2024   IR PERCUTANEOUS ART THROMBECTOMY/INFUSION INTRACRANIAL INC DIAG ANGIO  06/25/2024   RADIOLOGY WITH ANESTHESIA N/A 06/25/2024   Procedure: RADIOLOGY WITH ANESTHESIA;  Surgeon: Dolphus Carrion, MD;  Location: [facility];  Service: Radiology;  Laterality: N/A;   ROTATOR CUFF REPAIR      Family History  Problem Relation Age of Onset   Diabetes type II Other    Hypertension Other    Breast cancer Maternal Aunt        dx early 66s   Other Mother        died of natural causes at age 45   Other Father        stroke or heart attack while in shower   Diabetes type II Sister    Cancer Paternal Uncle        unknown type; dx early 20s    Social History: Lives alone and independent PTA. Per  reports that she quit smoking about 8 years ago. Her smoking use included cigarettes. She started smoking about 64 years ago. She has a 56 pack-year smoking history. She has never used smokeless tobacco. She reports that she does not drink alcohol  and does not use  drugs.   Allergies  Allergen Reactions   Adhesive [Tape] Itching and Other (See Comments)    Little red itchy bumps develop   Ativan  [Lorazepam ] Anxiety and Other (See Comments)    Family states makes her go crazy   Latex Itching and Other (See Comments)    Little red itchy bumps develop    Medications Prior to Admission  Medication Sig Dispense Refill   acetaminophen  (TYLENOL ) 650 MG CR tablet Take 650 mg by mouth 3 (three) times daily as needed for pain.     albuterol  (PROVENTIL  HFA;VENTOLIN  HFA) 108 (90 Base) MCG/ACT inhaler Inhale 2 puffs into the lungs every 6 (six) hours as needed for wheezing or shortness of breath.     aspirin  EC 81 MG tablet Take 81 mg by mouth daily.     atorvastatin  (LIPITOR) 10 MG tablet Take 10 mg by mouth at bedtime.     Cholecalciferol  (VITAMIN D ) 2000 units tablet  Take 4,000 Units by mouth daily.      escitalopram  (LEXAPRO ) 20 MG tablet Take 40 mg by mouth daily.  2   Multiple Vitamin (MULTIVITAMIN WITH MINERALS) TABS tablet Take 1 tablet by mouth daily.     pantoprazole  (PROTONIX ) 40 MG tablet Take 40 mg by mouth daily.     Polyethyl Glycol-Propyl Glycol (SYSTANE) 0.4-0.3 % SOLN Place 1 drop into both eyes daily as needed (dry eyes).     tiotropium (SPIRIVA) 18 MCG inhalation capsule Place 18 mcg into inhaler and inhale daily.     WELLBUTRIN  XL 150 MG 24 hr tablet 1 tablet in the morning Orally Once a day      Home: Home Living Family/patient expects to be discharged to:: Private residence Living Arrangements: Alone Available Help at Discharge:  (caregiver, Doyal , would increase hrs from 5 to 8 and family would cover to provide 24/7 physical assist) Type of Home:  (town house) Home Access: Level entry Home Layout: One level Bathroom Shower/Tub: Psychologist, counselling, Door [employer] Toilet: Handicapped height Bathroom Accessibility: Yes Home Equipment: Agricultural consultant (2 wheels), Rollator (4 wheels), [employer] - single point, Information systems manager - built in, [employer] - tub/shower, Hand held shower head, Grab bars - toilet, Transport chair  Lives With: Alone   Functional History: Prior Function Prior Level of Function : Independent/Modified Independent, Needs assist Mobility Comments: use of RW or cane in the home ADLs Comments: PCA prepared breakfaast/lunch, laundry, set-up for medication management, grocery shopping, supervises showers, drives to appts. Pt otherwise mod I in ADL  Functional Status:  Mobility: Bed Mobility Overal bed mobility: Needs Assistance Bed Mobility: Sit to Supine Rolling: Mod assist, Max assist, Used rails Supine to sit: Max assist, +2 for physical assistance, +2 for safety/equipment Sit to supine: Total assist, Used rails General bed mobility comments: Cues for pt to utilize bed rail with RUE and pull/shift weight R to address L lean. Transfers Overall transfer level: Needs assistance Equipment used: 2 person hand held assist Transfers: Sit to/from Stand, Bed to chair/wheelchair/BSC Sit to Stand: Total assist Bed to/from chair/wheelchair/BSC transfer type:: Stand pivot Stand pivot transfers: Total assist Step pivot transfers: Contact guard assist General transfer comment: Attempted to use stedy but pt unable to hold anterior lean without significant Ext support and has very little strength to initiate offloading bottom. Ambulation/Gait Ambulation/Gait assistance: Contact guard assist Gait Distance (Feet): 100 Feet Assistive device: Rolling walker (2 wheels) Gait Pattern/deviations: Step-to pattern, Decreased stride length General Gait Details: short step length, 2 standing rest breaks Gait velocity: dec Gait velocity interpretation: <1.31 ft/sec, indicative of household ambulator    ADL: ADL Overall ADL's : Needs assistance/impaired Eating/Feeding: NPO Eating/Feeding Details (indicate cue type and reason): assistance with opening containers Grooming: Sitting, Wash/dry face, Minimal assistance Grooming  Details (indicate cue type and reason): supported sitting in recliner, min A for throughness. Pt washing L side of face with RUE. Upper Body Bathing: Total assistance, Sitting Upper Body Bathing Details (indicate cue type and reason): on EOB Lower Body Bathing: Total assistance, Bed level Upper Body Dressing : Total assistance Upper Body Dressing Details (indicate cue type and reason): donned gown to cover back Lower Body Dressing: Total assistance, Bed level Lower Body Dressing Details (indicate cue type and reason): patient able to doff socks with mod assist to donn s Toilet Transfer: Total assistance Toilet Transfer Details (indicate cue type and reason): simulated with transfer from recliner to bed. Toileting- Clothing Manipulation and Hygiene: Total assistance, Bed level  Toileting - Clothing Manipulation Details (indicate cue type and reason): to bring depends underwear around hips Functional mobility during ADLs: Contact guard assist, Rolling walker (2 wheels) General ADL Comments: Pt needing total A for ADLs and transfers at this time. Limited initation for ADLs and very lethargic to perform much.  Cognition: Cognition Overall Cognitive Status: Impaired/Different from baseline Arousal/Alertness: Awake/alert Orientation Level: Oriented X4 Attention: Sustained Sustained Attention: Impaired Sustained Attention Impairment: Verbal basic, Functional basic Memory: Impaired Memory Impairment: Decreased recall of new information Awareness: Impaired Awareness Impairment: Intellectual impairment Problem Solving: Impaired Problem Solving Impairment: Functional basic Cognition Arousal: Lethargic Behavior During Therapy: Flat affect Overall Cognitive Status: Impaired/Different from baseline   Blood pressure (!) 122/41, pulse (!) 57, temperature 97.8 F (36.6 C), resp. rate 19, height 5' 1 (1.549 m), weight 71.7 kg, SpO2 99%. Physical Exam Vitals and nursing note reviewed. Exam  conducted with a chaperone present.  Constitutional:      General: She is not in acute distress.    Appearance: She is normal weight. She is ill-appearing.     Comments: Sleepy due to meds for pain control. Had difficulty keeping eye open after exam.  Awake, but  kept waking her up- daughter at bedside; NAD  HENT:     Head: Normocephalic and atraumatic.     Comments: Mild L facial droop Tongue midline  Mild to moderate facial redness- on R cheek    Right Ear: External ear normal.     Left Ear: External ear normal.     Nose: Nose normal. No congestion.     Comments: Cortrak- in nare    Mouth/Throat:     Mouth: Mucous membranes are dry.  Eyes:     Comments: Couldn't complete EOM's for me- but so sleepy  Cardiovascular:     Rate and Rhythm: Normal rate and regular rhythm.     Heart sounds: Normal heart sounds. No murmur heard.    No gallop.  Pulmonary:     Effort: Pulmonary effort is normal. No respiratory distress.     Breath sounds: Normal breath sounds. No wheezing, rhonchi or rales.  Abdominal:     General: Bowel sounds are normal. There is no distension.     Palpations: Abdomen is soft.     Tenderness: There is no abdominal tenderness.  Musculoskeletal:     Cervical back: Neck supple.     Comments:  RUE 5-/5 LUE 0/5 throughout RLE- 4+/5 throughout- hard to get pt to do exam LLE- PF 1/5- otherwise 0/5 in LLE  Skin:    General: Skin is warm and dry.     Comments: R forearm IV Some UE bruising  A lot of moles vs neurofibromas? On skin with actinic keratosis on multiple spots  Neurological:     Mental Status: She is alert.     Comments: Moderate dysarthria with occasional wet voice. Left facial weakness with left inattention. Left HH and needs cues to look to left. Able to smaller numbers on the clock. She was able to answer basic orientation questions, state age, DOB and PCP name.  Left hemiparesis with sensory deficits.  No clonus, no hoffman's Strong (+) Babinski Flaccid  on L side  Psychiatric:     Comments: Sleepy- hard to keep her awake for exam     Results for orders placed or performed during the hospital encounter of 06/21/24 (from the past 48 hours)  Glucose, capillary     Status: Abnormal   Collection Time: 07/06/24  4:05  PM  Result Value Ref Range   Glucose-Capillary 110 (H) 70 - 99 mg/dL    Comment: Glucose reference range applies only to samples taken after fasting for at least 8 hours.   Comment 1 Notify RN   Glucose, capillary     Status: None   Collection Time: 07/06/24  7:32 PM  Result Value Ref Range   Glucose-Capillary 95 70 - 99 mg/dL    Comment: Glucose reference range applies only to samples taken after fasting for at least 8 hours.   Comment 1 Notify RN    Comment 2 Document in Chart   Glucose, capillary     Status: Abnormal   Collection Time: 07/06/24 11:31 PM  Result Value Ref Range   Glucose-Capillary 103 (H) 70 - 99 mg/dL    Comment: Glucose reference range applies only to samples taken after fasting for at least 8 hours.  Basic metabolic panel with GFR     Status: Abnormal   Collection Time: 07/07/24  4:50 AM  Result Value Ref Range   Sodium 136 135 - 145 mmol/L   Potassium 4.7 3.5 - 5.1 mmol/L   Chloride 99 98 - 111 mmol/L   CO2 25 22 - 32 mmol/L   Glucose, Bld 118 (H) 70 - 99 mg/dL    Comment: Glucose reference range applies only to samples taken after fasting for at least 8 hours.   BUN 26 (H) 8 - 23 mg/dL   Creatinine, Ser 9.33 0.44 - 1.00 mg/dL   Calcium  8.9 8.9 - 10.3 mg/dL   GFR, Estimated >39 >39 mL/min    Comment: (NOTE) Calculated using the CKD-EPI Creatinine Equation (2021)    Anion gap 12 5 - 15    Comment: Performed at [facility], [address], [place], [place] [address]  CBC     Status: Abnormal   Collection Time: 07/07/24  4:50 AM  Result Value Ref Range   WBC 6.9 4.0 - 10.5 K/uL   RBC 3.32 (L) 3.87 - 5.11 MIL/uL   Hemoglobin 10.3 (L) 12.0 - 15.0 g/dL   HCT 68.2 (L) 63.9 - 53.9 %    MCV 95.5 80.0 - 100.0 fL   MCH 31.0 26.0 - 34.0 pg   MCHC 32.5 30.0 - 36.0 g/dL   RDW 86.0 88.4 - 84.4 %   Platelets 275 150 - 400 K/uL   nRBC 0.0 0.0 - 0.2 %    Comment: Performed at [facility], [address], [place], [place] [address]  Glucose, capillary     Status: Abnormal   Collection Time: 07/07/24  6:24 AM  Result Value Ref Range   Glucose-Capillary 123 (H) 70 - 99 mg/dL    Comment: Glucose reference range applies only to samples taken after fasting for at least 8 hours.   Comment 1 Notify RN    Comment 2 Document in Chart   Glucose, capillary     Status: Abnormal   Collection Time: 07/07/24  7:53 AM  Result Value Ref Range   Glucose-Capillary 120 (H) 70 - 99 mg/dL    Comment: Glucose reference range applies only to samples taken after fasting for at least 8 hours.   Comment 1 Notify RN    Comment 2 Document in Chart   Glucose, capillary     Status: None   Collection Time: 07/07/24  1:08 PM  Result Value Ref Range   Glucose-Capillary 92 70 - 99 mg/dL    Comment: Glucose reference range  applies only to samples taken after fasting for at least 8 hours.   Comment 1 Notify RN   Glucose, capillary     Status: Abnormal   Collection Time: 07/07/24  4:01 PM  Result Value Ref Range   Glucose-Capillary 113 (H) 70 - 99 mg/dL    Comment: Glucose reference range applies only to samples taken after fasting for at least 8 hours.   Comment 1 Notify RN    Comment 2 Document in Chart   Glucose, capillary     Status: Abnormal   Collection Time: 07/07/24  7:29 PM  Result Value Ref Range   Glucose-Capillary 127 (H) 70 - 99 mg/dL    Comment: Glucose reference range applies only to samples taken after fasting for at least 8 hours.   Comment 1 Notify RN    Comment 2 Document in Chart   Glucose, capillary     Status: Abnormal   Collection Time: 07/07/24 11:34 PM  Result Value Ref Range   Glucose-Capillary 110 (H) 70 - 99 mg/dL    Comment: Glucose reference range applies only to  samples taken after fasting for at least 8 hours.  Glucose, capillary     Status: Abnormal   Collection Time: 07/08/24  3:53 AM  Result Value Ref Range   Glucose-Capillary 112 (H) 70 - 99 mg/dL    Comment: Glucose reference range applies only to samples taken after fasting for at least 8 hours.   Comment 1 Notify RN    Comment 2 Document in Chart   Glucose, capillary     Status: Abnormal   Collection Time: 07/08/24  8:17 AM  Result Value Ref Range   Glucose-Capillary 120 (H) 70 - 99 mg/dL    Comment: Glucose reference range applies only to samples taken after fasting for at least 8 hours.   Comment 1 Notify RN    Comment 2 Document in Chart   Basic metabolic panel with GFR     Status: Abnormal   Collection Time: 07/08/24 10:40 AM  Result Value Ref Range   Sodium 138 135 - 145 mmol/L   Potassium 4.6 3.5 - 5.1 mmol/L   Chloride 99 98 - 111 mmol/L   CO2 27 22 - 32 mmol/L   Glucose, Bld 127 (H) 70 - 99 mg/dL    Comment: Glucose reference range applies only to samples taken after fasting for at least 8 hours.   BUN 22 8 - 23 mg/dL   Creatinine, Ser 9.28 0.44 - 1.00 mg/dL   Calcium  9.4 8.9 - 10.3 mg/dL   GFR, Estimated >39 >39 mL/min    Comment: (NOTE) Calculated using the CKD-EPI Creatinine Equation (2021)    Anion gap 12 5 - 15    Comment: Performed at [facility], [address], [place], [place] [address]  CBC     Status: Abnormal   Collection Time: 07/08/24 10:40 AM  Result Value Ref Range   WBC 6.9 4.0 - 10.5 K/uL   RBC 3.66 (L) 3.87 - 5.11 MIL/uL   Hemoglobin 11.2 (L) 12.0 - 15.0 g/dL   HCT 65.4 (L) 63.9 - 53.9 %   MCV 94.3 80.0 - 100.0 fL   MCH 30.6 26.0 - 34.0 pg   MCHC 32.5 30.0 - 36.0 g/dL   RDW 85.8 88.4 - 84.4 %   Platelets 286 150 - 400 K/uL   nRBC 0.0 0.0 - 0.2 %    Comment: Performed at [facility], [address], [place], [place] [address]   DG Swallowing Func-Speech Pathology Result Date: 07/08/2024 Table formatting from the original result  was not included. Modified Barium Swallow Study Patient Details Name: NATORI GUDINO MRN: [id number] Date of Birth: 03/13/1932 Today's Date: 07/08/2024 HPI/PMH: HPI: NANCYANN COTTERMAN is an 89 yo female presenting to ED 7/5 for RUQ pain, nausea/vomiting. Found to have suspected cholecystitis vs passed choledocholithiasis and acute liver injury, hyponatremia, bacteriuria, and hematuria. Initially seen at [facility] ED 7/3 with n/v and elevated LFTs but left without being seen. Daughter noted sudden onset AMS and L sided weakness 7/9 with CTA showing abrupt occlusion of distal R MCA M1 s/p TNK. Taken to IR with unsuccessful revascularization (repeated reocclusion), post-procedure CTH showed small SAH. Remained intubated after the procedure, 7/9-7/10. PMH includes HLD, PAD, COPD with emphysema, OSA, R breast cancer, back pain, esophageal spasm Clinical Impression: Clinical Impression: Pt exhibits severe oropharyngeal dysphagia, which is considered slightly worse compared to previous MBS 7/11 but is suspected to primarily be impacted by the amount of trials observed today. She was alert and participatory but delays related to initiation continue to result in gross aspiration of thin, nectar, and honey thick liquids most notably after the swallow secondary to reduced control of oral residuals as they progress through the pharynx. While she senses all aspiration and attempts to cough forcefully, it is ineffective at clearing the entire volume of the aspirated bolus (PAS 7). She was able to follow commands to attempt compensatory strategies but a chin tuck posture was ineffective with both thin and nectar thick liquids. Biofeedback was beneficial in cueing her to use a more timely subswallow but this cannot consistently be utilized clinically. There was less residue with purees and no penetration/aspiration. Recommend she remain NPO except ice chips after oral care. Plan to f/u to provide education to pt and her daughters. Will  continue following to target ESMT and use of prompt subswallows with trials of puree in an effort to make this an effective way to prevent aspiration with liquids. Factors that may increase risk of adverse event in presence of aspiration [name] & [name] 2021): Factors that may increase risk of adverse event in presence of aspiration [name] & [name] 2021): Reduced cognitive function; Limited mobility; Presence of tubes (ETT, trach, NG, etc.); Frequent aspiration of large volumes Recommendations/Plan: Swallowing Evaluation Recommendations Swallowing Evaluation Recommendations Recommendations: NPO; Ice chips PRN after oral care Medication Administration: Via alternative means Oral care recommendations: Oral care QID (4x/day); Oral care before ice chips/water  Recommended consults: Consider Palliative care Treatment Plan Treatment Plan Treatment recommendations: Therapy as outlined in treatment plan below Follow-up recommendations: Acute inpatient rehab (3 hours/day) Functional status assessment: Patient has had a recent decline in their functional status and demonstrates the ability to make significant improvements in function in a reasonable and predictable amount of time. Treatment frequency: Min 2x/week Treatment duration: 2 weeks Interventions: Aspiration precaution training; Oropharyngeal exercises; Compensatory techniques; Patient/family education; Trials of upgraded texture/liquids Recommendations Recommendations for follow up therapy are one component of a multi-disciplinary discharge planning process, led by the attending physician.  Recommendations may be updated based on patient status, additional functional criteria and insurance authorization. Assessment: Orofacial Exam: Orofacial Exam Oral Cavity: Oral Hygiene: WFL Oral Cavity - Dentition: Dentures, not available; Poor condition; Missing dentition Orofacial Anatomy: WFL Oral Motor/Sensory Function: Suspected cranial nerve impairment CN V - Trigeminal:  WFL CN VII - Facial: Left motor impairment CN IX - Glossopharyngeal, CN X - Vagus: Left motor impairment CN XII -  Hypoglossal: Left motor impairment Anatomy: Anatomy: Suspected cervical osteophytes; Prominent cricopharyngeus Boluses Administered: Boluses Administered Boluses Administered: Thin liquids (Level 0); Mildly thick liquids (Level 2, nectar thick); Moderately thick liquids (Level 3, honey thick); Puree  Oral Impairment Domain: Oral Impairment Domain Lip Closure: Escape progressing to mid-chin Tongue control during bolus hold: Posterior escape of less than half of bolus Bolus transport/lingual motion: Slow tongue motion Oral residue: Residue collection on oral structures Location of oral residue : Tongue; Palate Initiation of pharyngeal swallow : Pyriform sinuses  Pharyngeal Impairment Domain: Pharyngeal Impairment Domain Soft palate elevation: No bolus between soft palate (SP)/pharyngeal wall (PW) Laryngeal elevation: Complete superior movement of thyroid  cartilage with complete approximation of arytenoids to epiglottic petiole Anterior hyoid excursion: Complete anterior movement Epiglottic movement: Partial inversion Laryngeal vestibule closure: Complete, no air/contrast in laryngeal vestibule Pharyngeal stripping wave : Present - complete Pharyngeal contraction (A/P view only): N/A Pharyngoesophageal segment opening: Partial distention/partial duration, partial obstruction of flow Tongue base retraction: Narrow column of contrast or air between tongue base and PPW Pharyngeal residue: Collection of residue within or on pharyngeal structures Location of pharyngeal residue: Tongue base; Valleculae; Pyriform sinuses  Esophageal Impairment Domain: No data recorded Pill: No data recorded Penetration/Aspiration Scale Score: Penetration/Aspiration Scale Score 1.  Material does not enter airway: Puree 7.  Material enters airway, passes BELOW cords and not ejected out despite cough attempt by patient: Thin liquids  (Level 0); Mildly thick liquids (Level 2, nectar thick); Moderately thick liquids (Level 3, honey thick) Compensatory Strategies: Compensatory Strategies Compensatory strategies: Yes Chin tuck: Ineffective Ineffective Chin Tuck: Thin liquid (Level 0); Mildly thick liquid (Level 2, nectar thick)   General Information: Caregiver present: No  Diet Prior to this Study: NPO; Cortrak/Small bore NG tube   Temperature : Normal   Respiratory Status: WFL   Supplemental O2: None (Room air)   History of Recent Intubation: Yes  Behavior/Cognition: Alert; Cooperative; Requires cueing Self-Feeding Abilities: Needs assist with self-feeding Baseline vocal quality/speech: Dysphonic Volitional Cough: Able to elicit Volitional Swallow: Able to elicit Exam Limitations: No limitations Goal Planning: Prognosis for improved oropharyngeal function: Fair Barriers to Reach Goals: Cognitive deficits; Severity of deficits No data recorded Patient/Family Stated Goal: continued improvement Consulted and agree with results and recommendations: Patient; Physician Pain: Pain Assessment Pain Assessment: Faces Faces Pain Scale: 4 Pain Location: all over Pain Descriptors / Indicators: Aching; Grimacing; Discomfort Pain Intervention(s): Monitored during session End of Session: Start Time:SLP Start Time (ACUTE ONLY): 0957 Stop Time: SLP Stop Time (ACUTE ONLY): 1021 Time Calculation:SLP Time Calculation (min) (ACUTE ONLY): 24 min Charges: SLP Evaluations $ SLP Speech Visit: 1 Visit SLP Evaluations $MBS Swallow: 1 Procedure $Swallowing Treatment: 1 Procedure $Speech Treatment for Individual: 1 Procedure SLP visit diagnosis: SLP Visit Diagnosis: Dysphagia, oropharyngeal phase (R13.12) Past Medical History: Past Medical History: Diagnosis Date  Anxiety   Back pain   Cancer (HCC)   Depression   Emphysema   Esophageal spasm   Lung nodule   OSA (obstructive sleep apnea)   Osteomalacia   Personal history of radiation therapy   Vertigo   Vitamin D  deficiency   Past Surgical History: Past Surgical History: Procedure Laterality Date  ABDOMINAL HYSTERECTOMY    APPENDECTOMY    BREAST BIOPSY Right 08/31/2020  BREAST EXCISIONAL BIOPSY Left 1978  BREAST LUMPECTOMY Right 09/30/2020  BREAST LUMPECTOMY WITH RADIOACTIVE SEED LOCALIZATION Right 09/30/2020  Procedure: RIGHT BREAST LUMPECTOMY WITH RADIOACTIVE SEED LOCALIZATION;  Surgeon: Curvin Deward MOULD, MD;  Location: [facility];  Service: General;  Laterality: Right;  CARPAL TUNNEL RELEASE    IR CT HEAD LTD  06/25/2024  IR PERCUTANEOUS ART THROMBECTOMY/INFUSION INTRACRANIAL INC DIAG ANGIO  06/25/2024  RADIOLOGY WITH ANESTHESIA N/A 06/25/2024  Procedure: RADIOLOGY WITH ANESTHESIA;  Surgeon: Dolphus Carrion, MD;  Location: [facility] OR;  Service: Radiology;  Laterality: N/A;  ROTATOR CUFF REPAIR   Damien Blumenthal, M.A., CCC-SLP Speech Language Pathology, Acute Rehabilitation Services Secure Chat preferred [phone] 07/08/2024, 11:13 AM     Blood pressure (!) 122/41, pulse (!) 57, temperature 97.8 F (36.6 C), resp. rate 19, height 5' 1 (1.549 m), weight 71.7 kg, SpO2 99%.  Medical Problem List and Plan: 1. Functional deficits secondary to R MCA stroke  -patient may  shower  -ELOS/Goals: 16-2 days- min-mod A  Admit to CIR 2.  Antithrombotics: -DVT/anticoagulation:  Pharmaceutical: Lovenox   -antiplatelet therapy: Continue ASA.  --Plavix recommended if no further procedures planned. Question need for PEG.  3. Pain Management: Continues to have issues with pain/back spasms. Last used tylenol  2 days ago.  --sedated from Flexeril  5 mg/tramadol  50 mg this afternoon. Will decrease doses.  4. Mood/Behavior/Sleep: LCSW to follow for evaluation and support.   --reports of hallucination/encephalopath. Has has a lot of confusion at nights and has been been sleeping a lot during the day per daughter  --has chronic insomnia-->used to just lie in bed and sleeps till 10 am usually.  --Will schedule melatonin at 8 pm tonight.  Sleep wake chart to monitor sleep   -antipsychotic agents: N/A 5. Neuropsych/cognition: This patient may be intermittently capable of making decisions on her own behalf. 6. Skin/Wound Care: Routine pressure relief measures.  7. Fluids/Electrolytes/Nutrition: monitor I/O. Check  CMET in am. Continue tube feeds-->does not seem to have water  flushes but renal status WNL.  8.  E coli bacteremia: Treated with 10 day course of antibiotics. Ceftriaxone -->Duricef thru 07/14. Thrombocytopenia/leucocytosis has resolved.   --Hematuria has resolved. No dysuria.  9. Chronic back/Right knee pain: Continue voltaren  gel qid to knee and back till family bring in the biofreeze (which works better)  --Lidocaine  patches at nights to help with back spasms. Decrease flexeril  to 2.5 mg every 8 hours prn  --schedule tylenol  500 mg qid as LFTs resolved.  10. Abnormal LFTs: Have resolved and Lipitor added on 07/19. Continue to monitor weekly for  now.  11. COPD/OSA: Has CPAP but not using PTA. Continue Yupelri  nebs daily (in place of Spiriva).  --Used albuterol  every night PTA. .  12. Hypomagnesemia: Malnutrition? Recheck level in am as albumin levels also low.  13. Anemia: Monitor for signs of bleeding. H/H recovering from 13-->9.9-->11.2 14.  Bradycardia: HR note to be in 51 to 61 range and had boderline prolonged Qtc on admission. Likely due to stroke.  --Check EKG for baseline.  15.  H/o MDD/anxiety: Stable on Lexapro .        Sharlet GORMAN Schmitz, PA-C 07/08/2024  I have personally performed a face to face diagnostic evaluation of this patient and formulated the key components of the plan.  Additionally, I have personally reviewed laboratory data, imaging studies, as well as relevant notes and concur with the physician assistant's documentation above.   The patient's status has not changed from the original H&P.  Any changes in documentation from the acute care chart have been noted above.

## 2024-07-08 NOTE — Progress Notes (Addendum)
   Inpatient Rehabilitation Admissions Coordinator   I met with daughter, [name], at bedside. We reviewed estimated cost of care for CIR if approved as well as goals for CIR. Dr Babs to do peer to peer with [facility] today if needed. I await insurance approval to admit.  Heron Leavell, RN, MSN Rehab Admissions Coordinator [phone] 07/08/2024 10:32 AM

## 2024-07-08 NOTE — Progress Notes (Signed)
Awaiting k pad to be delivered    Tilden Dome, LPN

## 2024-07-08 NOTE — H&P (Signed)
 Physical Medicine and Rehabilitation Admission H&P        Chief Complaint  Patient presents with   Functional decline due to stroke       HPI:  [name] is an 89 year old R handed  female with history of OSA, macular degeneration, MDD, dysphagia, COPD/emphysema,  right breast cancer s/p lumpectomy/XRT, who was admitted to  [facility] on 06/21/24 with 3-4 day hx of RUQ abdominal pain w/N/V/D, reports of fever and abnormal LFTs. She was felt to have possible acalculous cholecystitis and started on IVF and antibiotics. MRCP 07/06 was negative for CBD stones, dilatation or acute cholecystitis and showed probable hemangioma R-hepatic lobe. Noted to have positive UTI. Blood cultures positive for  E coli in 1/4 bottles and maintained on ceftriaxone /Flagyl . On 07/08, she developed DOE due to fluid overload w/BNP 622 as well as hypokalemia with K -2.9 which was treated with IV diuresis as well as runs of K. 2 D echo done revealing EF 60-65% with  G2DD    On 07/09 pm, she was found to have AMS with left facial droop, right gaze deviation, left sided weakness and slurred speech. CTA head/neck showed abrupt occlusion of distal M1 segment of R-MCA, TNK administered and she underwent cerebral angio with endovascular TICI 2 B revascularization of R-MCA by Dr. Dolphus. Use of rescue stent deferred due to due to concerns of increased risk of intracranial hemorrhage w/DAPT.  Post procedure CT head with small SAH and tolerated extubation by 07/10. MRI brain done revealing evolving R-MCA infarct with mild associated petechial blood products right frontoparietal region and small volume acute SAH base of sylvian fissure. Dr. Jerri felt that stroke likely due to large vessel disease and on ASA given SAH.   NPO recommended and cortak placed for nutritional support. MBS done revealing moderate oropharyngeal dysphagia likely impacted by cervical osteophytes and prominent cricopharyngeus and NPO recommended.  PEG has been  discussed with family by Dr. Rosemarie and issues with nausea treated with decrease in rate of TF. Repeat MBS 07/22 showed severe oropharyngeal dysphagia with delay in initiation and gross aspiration and inability to clear aspirate--to continue NPO except ice chips.  Hospital course also significant for encephalopathy with hallucinations, back not relived by flexeril  and tramadol  added. Voltaren  gel added for knee pain, sleep wake disruption,    Patient with resultant  left facial droop with dysarthria, dysphagia, dense LUE>LLE weakness with sensory deficits, left hemianopsia with no blink to threat and able to follow one step commands with multimodal cues and increased time. She lives alone, was sedentary and ambulated with use of RW due to hx of truncal ataxia/ankle instability. Had aide 5 hrs/day for home/medication management and family plans on providing 24/7 care after discharge. Therapy has been working with patient who is showing improvement in alertness, working on sitting balance at EOB as well as standing attempts with left knee blocked. She requires max to total assist overall and CIR recommended due to functional decline.     Pt very sleepy after Flexeril  today-  having LBP- which has been worse since in hospital.  Took something for pain earlier, so is sleepy.   LBM this AM.    Review of Systems  Constitutional:  Negative for chills and fever.  HENT:  Positive for hearing loss.   Eyes:  Positive for blurred vision (centrall? uses glasses for a distance.).  Respiratory:  Negative for cough and shortness of breath.   Cardiovascular:  Negative  for chest pain.  Gastrointestinal:  Negative for abdominal pain and constipation.  Genitourinary:  Negative for dysuria.  Musculoskeletal:  Positive for back pain (back spasms--chronic), falls (ankles tends to turn and cause falls), joint pain (right knee) and myalgias.  Skin:  Negative for rash.  Neurological:  Positive for dizziness (gets dizzy when  she looks up), sensory change, speech change and focal weakness.  Psychiatric/Behavioral:  The patient has insomnia (chronic).   All other systems reviewed and are negative.          Past Medical History:  Diagnosis Date   Anxiety     Back pain     Cancer (HCC)     Depression     Emphysema     Esophageal spasm     Lung nodule     OSA (obstructive sleep apnea)     Osteomalacia     Personal history of radiation therapy     Vertigo     Vitamin D  deficiency                 Past Surgical History:  Procedure Laterality Date   ABDOMINAL HYSTERECTOMY       APPENDECTOMY       BREAST BIOPSY Right 08/31/2020   BREAST EXCISIONAL BIOPSY Left 1978   BREAST LUMPECTOMY Right 09/30/2020   BREAST LUMPECTOMY WITH RADIOACTIVE SEED LOCALIZATION Right 09/30/2020    Procedure: RIGHT BREAST LUMPECTOMY WITH RADIOACTIVE SEED LOCALIZATION;  Surgeon: Curvin Deward MOULD, MD;  Location: [facility];  Service: General;  Laterality: Right;   CARPAL TUNNEL RELEASE       IR CT HEAD LTD   06/25/2024   IR PERCUTANEOUS ART THROMBECTOMY/INFUSION INTRACRANIAL INC DIAG ANGIO   06/25/2024   RADIOLOGY WITH ANESTHESIA N/A 06/25/2024    Procedure: RADIOLOGY WITH ANESTHESIA;  Surgeon: Dolphus Carrion, MD;  Location: [facility];  Service: Radiology;  Laterality: N/A;   ROTATOR CUFF REPAIR                   Family History  Problem Relation Age of Onset   Diabetes type II Other     Hypertension Other     Breast cancer Maternal Aunt          dx early 35s   Other Mother          died of natural causes at age 69   Other Father          stroke or heart attack while in shower   Diabetes type II Sister     Cancer Paternal Uncle          unknown type; dx early 101s          Social History: Lives alone and independent PTA. Per  reports that she quit smoking about 8 years ago. Her smoking use included cigarettes. She started smoking about 64 years ago. She has a 56 pack-year smoking history. She has never  used smokeless tobacco. She reports that she does not drink alcohol  and does not use drugs.     Allergies       Allergies  Allergen Reactions   Adhesive [Tape] Itching and Other (See Comments)      Little red itchy bumps develop   Ativan  [Lorazepam ] Anxiety and Other (See Comments)      Family states makes her go crazy   Latex Itching and Other (See Comments)      Little red itchy bumps develop  Medications Prior to Admission  Medication Sig Dispense Refill   acetaminophen  (TYLENOL ) 650 MG CR tablet Take 650 mg by mouth 3 (three) times daily as needed for pain.       albuterol  (PROVENTIL  HFA;VENTOLIN  HFA) 108 (90 Base) MCG/ACT inhaler Inhale 2 puffs into the lungs every 6 (six) hours as needed for wheezing or shortness of breath.       aspirin  EC 81 MG tablet Take 81 mg by mouth daily.       atorvastatin  (LIPITOR) 10 MG tablet Take 10 mg by mouth at bedtime.       Cholecalciferol  (VITAMIN D ) 2000 units tablet Take 4,000 Units by mouth daily.        escitalopram  (LEXAPRO ) 20 MG tablet Take 40 mg by mouth daily.   2   Multiple Vitamin (MULTIVITAMIN WITH MINERALS) TABS tablet Take 1 tablet by mouth daily.       pantoprazole  (PROTONIX ) 40 MG tablet Take 40 mg by mouth daily.       Polyethyl Glycol-Propyl Glycol (SYSTANE) 0.4-0.3 % SOLN Place 1 drop into both eyes daily as needed (dry eyes).       tiotropium (SPIRIVA) 18 MCG inhalation capsule Place 18 mcg into inhaler and inhale daily.       WELLBUTRIN  XL 150 MG 24 hr tablet 1 tablet in the morning Orally Once a day              Home: Home Living Family/patient expects to be discharged to:: Private residence Living Arrangements: Alone Available Help at Discharge:  (caregiver, Doyal , would increase hrs from 5 to 8 and family would cover to provide 24/7 physical assist) Type of Home:  (town house) Home Access: Level entry Home Layout: One level Bathroom Shower/Tub: Psychologist, counselling, Door [employer] Toilet: Handicapped  height Bathroom Accessibility: Yes Home Equipment: Agricultural consultant (2 wheels), Rollator (4 wheels), [employer] - single point, Information systems manager - built in, [employer] - tub/shower, Hand held shower head, Grab bars - toilet, Transport chair  Lives With: Alone   Functional History: Prior Function Prior Level of Function : Independent/Modified Independent, Needs assist Mobility Comments: use of RW or cane in the home ADLs Comments: PCA prepared breakfaast/lunch, laundry, set-up for medication management, grocery shopping, supervises showers, drives to appts. Pt otherwise mod I in ADL   Functional Status:  Mobility: Bed Mobility Overal bed mobility: Needs Assistance Bed Mobility: Sit to Supine Rolling: Mod assist, Max assist, Used rails Supine to sit: Max assist, +2 for physical assistance, +2 for safety/equipment Sit to supine: Total assist, Used rails General bed mobility comments: Cues for pt to utilize bed rail with RUE and pull/shift weight R to address L lean. Transfers Overall transfer level: Needs assistance Equipment used: 2 person hand held assist Transfers: Sit to/from Stand, Bed to chair/wheelchair/BSC Sit to Stand: Total assist Bed to/from chair/wheelchair/BSC transfer type:: Stand pivot Stand pivot transfers: Total assist Step pivot transfers: Contact guard assist General transfer comment: Attempted to use stedy but pt unable to hold anterior lean without significant Ext support and has very little strength to initiate offloading bottom. Ambulation/Gait Ambulation/Gait assistance: Contact guard assist Gait Distance (Feet): 100 Feet Assistive device: Rolling walker (2 wheels) Gait Pattern/deviations: Step-to pattern, Decreased stride length General Gait Details: short step length, 2 standing rest breaks Gait velocity: dec Gait velocity interpretation: <1.31 ft/sec, indicative of household ambulator   ADL: ADL Overall ADL's : Needs assistance/impaired Eating/Feeding:  NPO Eating/Feeding Details (indicate cue type and reason): assistance with opening  containers Grooming: Sitting, Wash/dry face, Minimal assistance Grooming Details (indicate cue type and reason): supported sitting in recliner, min A for throughness. Pt washing L side of face with RUE. Upper Body Bathing: Total assistance, Sitting Upper Body Bathing Details (indicate cue type and reason): on EOB Lower Body Bathing: Total assistance, Bed level Upper Body Dressing : Total assistance Upper Body Dressing Details (indicate cue type and reason): donned gown to cover back Lower Body Dressing: Total assistance, Bed level Lower Body Dressing Details (indicate cue type and reason): patient able to doff socks with mod assist to donn s Toilet Transfer: Total assistance Toilet Transfer Details (indicate cue type and reason): simulated with transfer from recliner to bed. Toileting- Clothing Manipulation and Hygiene: Total assistance, Bed level Toileting - Clothing Manipulation Details (indicate cue type and reason): to bring depends underwear around hips Functional mobility during ADLs: Contact guard assist, Rolling walker (2 wheels) General ADL Comments: Pt needing total A for ADLs and transfers at this time. Limited initation for ADLs and very lethargic to perform much.   Cognition: Cognition Overall Cognitive Status: Impaired/Different from baseline Arousal/Alertness: Awake/alert Orientation Level: Oriented X4 Attention: Sustained Sustained Attention: Impaired Sustained Attention Impairment: Verbal basic, Functional basic Memory: Impaired Memory Impairment: Decreased recall of new information Awareness: Impaired Awareness Impairment: Intellectual impairment Problem Solving: Impaired Problem Solving Impairment: Functional basic Cognition Arousal: Lethargic Behavior During Therapy: Flat affect Overall Cognitive Status: Impaired/Different from baseline     Blood pressure (!) 122/41, pulse (!)  57, temperature 97.8 F (36.6 C), resp. rate 19, height 5' 1 (1.549 m), weight 71.7 kg, SpO2 99%. Physical Exam Vitals and nursing note reviewed. Exam conducted with a chaperone present.  Constitutional:      General: She is not in acute distress.    Appearance: She is normal weight. She is ill-appearing.     Comments: Sleepy due to meds for pain control. Had difficulty keeping eye open after exam.  Awake, but  kept waking her up- daughter at bedside; NAD  HENT:     Head: Normocephalic and atraumatic.     Comments: Mild L facial droop Tongue midline  Mild to moderate facial redness- on R cheek    Right Ear: External ear normal.     Left Ear: External ear normal.     Nose: Nose normal. No congestion.     Comments: Cortrak- in nare    Mouth/Throat:     Mouth: Mucous membranes are dry.  Eyes:     Comments: Couldn't complete EOM's for me- but so sleepy  Cardiovascular:     Rate and Rhythm: Normal rate and regular rhythm.     Heart sounds: Normal heart sounds. No murmur heard.    No gallop.  Pulmonary:     Effort: Pulmonary effort is normal. No respiratory distress.     Breath sounds: Normal breath sounds. No wheezing, rhonchi or rales.  Abdominal:     General: Bowel sounds are normal. There is no distension.     Palpations: Abdomen is soft.     Tenderness: There is no abdominal tenderness.  Musculoskeletal:     Cervical back: Neck supple.     Comments:  RUE 5-/5 LUE 0/5 throughout RLE- 4+/5 throughout- hard to get pt to do exam LLE- PF 1/5- otherwise 0/5 in LLE  Skin:    General: Skin is warm and dry.     Comments: R forearm IV Some UE bruising  A lot of moles vs neurofibromas? On skin with actinic keratosis  on multiple spots  Neurological:     Mental Status: She is alert.     Comments: Moderate dysarthria with occasional wet voice. Left facial weakness with left inattention. Left HH and needs cues to look to left. Able to smaller numbers on the clock. She was able to answer  basic orientation questions, state age, DOB and PCP name.  Left hemiparesis with sensory deficits.  No clonus, no hoffman's Strong (+) Babinski Flaccid on L side  Psychiatric:     Comments: Sleepy- hard to keep her awake for exam       Lab Results Last 48 Hours        Results for orders placed or performed during the hospital encounter of 06/21/24 (from the past 48 hours)  Glucose, capillary     Status: Abnormal    Collection Time: 07/06/24  4:05 PM  Result Value Ref Range    Glucose-Capillary 110 (H) 70 - 99 mg/dL      Comment: Glucose reference range applies only to samples taken after fasting for at least 8 hours.    Comment 1 Notify RN    Glucose, capillary     Status: None    Collection Time: 07/06/24  7:32 PM  Result Value Ref Range    Glucose-Capillary 95 70 - 99 mg/dL      Comment: Glucose reference range applies only to samples taken after fasting for at least 8 hours.    Comment 1 Notify RN      Comment 2 Document in Chart    Glucose, capillary     Status: Abnormal    Collection Time: 07/06/24 11:31 PM  Result Value Ref Range    Glucose-Capillary 103 (H) 70 - 99 mg/dL      Comment: Glucose reference range applies only to samples taken after fasting for at least 8 hours.  Basic metabolic panel with GFR     Status: Abnormal    Collection Time: 07/07/24  4:50 AM  Result Value Ref Range    Sodium 136 135 - 145 mmol/L    Potassium 4.7 3.5 - 5.1 mmol/L    Chloride 99 98 - 111 mmol/L    CO2 25 22 - 32 mmol/L    Glucose, Bld 118 (H) 70 - 99 mg/dL      Comment: Glucose reference range applies only to samples taken after fasting for at least 8 hours.    BUN 26 (H) 8 - 23 mg/dL    Creatinine, Ser 9.33 0.44 - 1.00 mg/dL    Calcium  8.9 8.9 - 10.3 mg/dL    GFR, Estimated >39 >39 mL/min      Comment: (NOTE) Calculated using the CKD-EPI Creatinine Equation (2021)      Anion gap 12 5 - 15      Comment: Performed at [facility], [address] [place], [place] [address]   CBC     Status: Abnormal    Collection Time: 07/07/24  4:50 AM  Result Value Ref Range    WBC 6.9 4.0 - 10.5 K/uL    RBC 3.32 (L) 3.87 - 5.11 MIL/uL    Hemoglobin 10.3 (L) 12.0 - 15.0 g/dL    HCT 68.2 (L) 63.9 - 46.0 %    MCV 95.5 80.0 - 100.0 fL    MCH 31.0 26.0 - 34.0 pg    MCHC 32.5 30.0 - 36.0 g/dL    RDW 86.0 88.4 - 84.4 %    Platelets 275 150 - 400 K/uL  nRBC 0.0 0.0 - 0.2 %      Comment: Performed at [facility], [address], [place], [place] [address]  Glucose, capillary     Status: Abnormal    Collection Time: 07/07/24  6:24 AM  Result Value Ref Range    Glucose-Capillary 123 (H) 70 - 99 mg/dL      Comment: Glucose reference range applies only to samples taken after fasting for at least 8 hours.    Comment 1 Notify RN      Comment 2 Document in Chart    Glucose, capillary     Status: Abnormal    Collection Time: 07/07/24  7:53 AM  Result Value Ref Range    Glucose-Capillary 120 (H) 70 - 99 mg/dL      Comment: Glucose reference range applies only to samples taken after fasting for at least 8 hours.    Comment 1 Notify RN      Comment 2 Document in Chart    Glucose, capillary     Status: None    Collection Time: 07/07/24  1:08 PM  Result Value Ref Range    Glucose-Capillary 92 70 - 99 mg/dL      Comment: Glucose reference range applies only to samples taken after fasting for at least 8 hours.    Comment 1 Notify RN    Glucose, capillary     Status: Abnormal    Collection Time: 07/07/24  4:01 PM  Result Value Ref Range    Glucose-Capillary 113 (H) 70 - 99 mg/dL      Comment: Glucose reference range applies only to samples taken after fasting for at least 8 hours.    Comment 1 Notify RN      Comment 2 Document in Chart    Glucose, capillary     Status: Abnormal    Collection Time: 07/07/24  7:29 PM  Result Value Ref Range    Glucose-Capillary 127 (H) 70 - 99 mg/dL      Comment: Glucose reference range applies only to samples taken after fasting for at  least 8 hours.    Comment 1 Notify RN      Comment 2 Document in Chart    Glucose, capillary     Status: Abnormal    Collection Time: 07/07/24 11:34 PM  Result Value Ref Range    Glucose-Capillary 110 (H) 70 - 99 mg/dL      Comment: Glucose reference range applies only to samples taken after fasting for at least 8 hours.  Glucose, capillary     Status: Abnormal    Collection Time: 07/08/24  3:53 AM  Result Value Ref Range    Glucose-Capillary 112 (H) 70 - 99 mg/dL      Comment: Glucose reference range applies only to samples taken after fasting for at least 8 hours.    Comment 1 Notify RN      Comment 2 Document in Chart    Glucose, capillary     Status: Abnormal    Collection Time: 07/08/24  8:17 AM  Result Value Ref Range    Glucose-Capillary 120 (H) 70 - 99 mg/dL      Comment: Glucose reference range applies only to samples taken after fasting for at least 8 hours.    Comment 1 Notify RN      Comment 2 Document in Chart    Basic metabolic panel with GFR     Status: Abnormal    Collection Time: 07/08/24 10:40 AM  Result Value Ref Range    Sodium 138 135 - 145 mmol/L    Potassium 4.6 3.5 - 5.1 mmol/L    Chloride 99 98 - 111 mmol/L    CO2 27 22 - 32 mmol/L    Glucose, Bld 127 (H) 70 - 99 mg/dL      Comment: Glucose reference range applies only to samples taken after fasting for at least 8 hours.    BUN 22 8 - 23 mg/dL    Creatinine, Ser 9.28 0.44 - 1.00 mg/dL    Calcium  9.4 8.9 - 10.3 mg/dL    GFR, Estimated >39 >39 mL/min      Comment: (NOTE) Calculated using the CKD-EPI Creatinine Equation (2021)      Anion gap 12 5 - 15      Comment: Performed at [facility], [address], [place], [place] [address]  CBC     Status: Abnormal    Collection Time: 07/08/24 10:40 AM  Result Value Ref Range    WBC 6.9 4.0 - 10.5 K/uL    RBC 3.66 (L) 3.87 - 5.11 MIL/uL    Hemoglobin 11.2 (L) 12.0 - 15.0 g/dL    HCT 65.4 (L) 63.9 - 46.0 %    MCV 94.3 80.0 - 100.0 fL    MCH 30.6  26.0 - 34.0 pg    MCHC 32.5 30.0 - 36.0 g/dL    RDW 85.8 88.4 - 84.4 %    Platelets 286 150 - 400 K/uL    nRBC 0.0 0.0 - 0.2 %      Comment: Performed at [facility], [address], [place], [place] [address]       Imaging Results (Last 48 hours)  DG Swallowing Func-Speech Pathology Result Date: 07/08/2024 Table formatting from the original result was not included. Modified Barium Swallow Study Patient Details Name: ASHTON SABINE MRN: [id number] Date of Birth: [birth year] Today's Date: 07/08/2024 HPI/PMH: HPI: BETSAIDA MISSOURI is an 89 yo female presenting to ED 7/5 for RUQ pain, nausea/vomiting. Found to have suspected cholecystitis vs passed choledocholithiasis and acute liver injury, hyponatremia, bacteriuria, and hematuria. Initially seen at [facility] ED 7/3 with n/v and elevated LFTs but left without being seen. Daughter noted sudden onset AMS and L sided weakness 7/9 with CTA showing abrupt occlusion of distal R MCA M1 s/p TNK. Taken to IR with unsuccessful revascularization (repeated reocclusion), post-procedure CTH showed small SAH. Remained intubated after the procedure, 7/9-7/10. PMH includes HLD, PAD, COPD with emphysema, OSA, R breast cancer, back pain, esophageal spasm Clinical Impression: Clinical Impression: Pt exhibits severe oropharyngeal dysphagia, which is considered slightly worse compared to previous MBS 7/11 but is suspected to primarily be impacted by the amount of trials observed today. She was alert and participatory but delays related to initiation continue to result in gross aspiration of thin, nectar, and honey thick liquids most notably after the swallow secondary to reduced control of oral residuals as they progress through the pharynx. While she senses all aspiration and attempts to cough forcefully, it is ineffective at clearing the entire volume of the aspirated bolus (PAS 7). She was able to follow commands to attempt compensatory strategies but a chin tuck posture was  ineffective with both thin and nectar thick liquids. Biofeedback was beneficial in cueing her to use a more timely subswallow but this cannot consistently be utilized clinically. There was less residue with purees and no penetration/aspiration. Recommend she remain NPO except ice chips after oral care.  Plan to f/u to provide education to pt and her daughters. Will continue following to target ESMT and use of prompt subswallows with trials of puree in an effort to make this an effective way to prevent aspiration with liquids. Factors that may increase risk of adverse event in presence of aspiration [name] & [name] 2021): Factors that may increase risk of adverse event in presence of aspiration [name] & [name] 2021): Reduced cognitive function; Limited mobility; Presence of tubes (ETT, trach, NG, etc.); Frequent aspiration of large volumes Recommendations/Plan: Swallowing Evaluation Recommendations Swallowing Evaluation Recommendations Recommendations: NPO; Ice chips PRN after oral care Medication Administration: Via alternative means Oral care recommendations: Oral care QID (4x/day); Oral care before ice chips/water  Recommended consults: Consider Palliative care Treatment Plan Treatment Plan Treatment recommendations: Therapy as outlined in treatment plan below Follow-up recommendations: Acute inpatient rehab (3 hours/day) Functional status assessment: Patient has had a recent decline in their functional status and demonstrates the ability to make significant improvements in function in a reasonable and predictable amount of time. Treatment frequency: Min 2x/week Treatment duration: 2 weeks Interventions: Aspiration precaution training; Oropharyngeal exercises; Compensatory techniques; Patient/family education; Trials of upgraded texture/liquids Recommendations Recommendations for follow up therapy are one component of a multi-disciplinary discharge planning process, led by the attending physician.   Recommendations may be updated based on patient status, additional functional criteria and insurance authorization. Assessment: Orofacial Exam: Orofacial Exam Oral Cavity: Oral Hygiene: WFL Oral Cavity - Dentition: Dentures, not available; Poor condition; Missing dentition Orofacial Anatomy: WFL Oral Motor/Sensory Function: Suspected cranial nerve impairment CN V - Trigeminal: WFL CN VII - Facial: Left motor impairment CN IX - Glossopharyngeal, CN X - Vagus: Left motor impairment CN XII - Hypoglossal: Left motor impairment Anatomy: Anatomy: Suspected cervical osteophytes; Prominent cricopharyngeus Boluses Administered: Boluses Administered Boluses Administered: Thin liquids (Level 0); Mildly thick liquids (Level 2, nectar thick); Moderately thick liquids (Level 3, honey thick); Puree  Oral Impairment Domain: Oral Impairment Domain Lip Closure: Escape progressing to mid-chin Tongue control during bolus hold: Posterior escape of less than half of bolus Bolus transport/lingual motion: Slow tongue motion Oral residue: Residue collection on oral structures Location of oral residue : Tongue; Palate Initiation of pharyngeal swallow : Pyriform sinuses  Pharyngeal Impairment Domain: Pharyngeal Impairment Domain Soft palate elevation: No bolus between soft palate (SP)/pharyngeal wall (PW) Laryngeal elevation: Complete superior movement of thyroid  cartilage with complete approximation of arytenoids to epiglottic petiole Anterior hyoid excursion: Complete anterior movement Epiglottic movement: Partial inversion Laryngeal vestibule closure: Complete, no air/contrast in laryngeal vestibule Pharyngeal stripping wave : Present - complete Pharyngeal contraction (A/P view only): N/A Pharyngoesophageal segment opening: Partial distention/partial duration, partial obstruction of flow Tongue base retraction: Narrow column of contrast or air between tongue base and PPW Pharyngeal residue: Collection of residue within or on pharyngeal  structures Location of pharyngeal residue: Tongue base; Valleculae; Pyriform sinuses  Esophageal Impairment Domain: No data recorded Pill: No data recorded Penetration/Aspiration Scale Score: Penetration/Aspiration Scale Score 1.  Material does not enter airway: Puree 7.  Material enters airway, passes BELOW cords and not ejected out despite cough attempt by patient: Thin liquids (Level 0); Mildly thick liquids (Level 2, nectar thick); Moderately thick liquids (Level 3, honey thick) Compensatory Strategies: Compensatory Strategies Compensatory strategies: Yes Chin tuck: Ineffective Ineffective Chin Tuck: Thin liquid (Level 0); Mildly thick liquid (Level 2, nectar thick)   General Information: Caregiver present: No  Diet Prior to this Study: NPO; Cortrak/Small bore NG tube   Temperature : Normal  Respiratory Status: WFL   Supplemental O2: None (Room air)   History of Recent Intubation: Yes  Behavior/Cognition: Alert; Cooperative; Requires cueing Self-Feeding Abilities: Needs assist with self-feeding Baseline vocal quality/speech: Dysphonic Volitional Cough: Able to elicit Volitional Swallow: Able to elicit Exam Limitations: No limitations Goal Planning: Prognosis for improved oropharyngeal function: Fair Barriers to Reach Goals: Cognitive deficits; Severity of deficits No data recorded Patient/Family Stated Goal: continued improvement Consulted and agree with results and recommendations: Patient; Physician Pain: Pain Assessment Pain Assessment: Faces Faces Pain Scale: 4 Pain Location: all over Pain Descriptors / Indicators: Aching; Grimacing; Discomfort Pain Intervention(s): Monitored during session End of Session: Start Time:SLP Start Time (ACUTE ONLY): 0957 Stop Time: SLP Stop Time (ACUTE ONLY): 1021 Time Calculation:SLP Time Calculation (min) (ACUTE ONLY): 24 min Charges: SLP Evaluations $ SLP Speech Visit: 1 Visit SLP Evaluations $MBS Swallow: 1 Procedure $Swallowing Treatment: 1 Procedure $Speech Treatment  for Individual: 1 Procedure SLP visit diagnosis: SLP Visit Diagnosis: Dysphagia, oropharyngeal phase (R13.12) Past Medical History: Past Medical History: Diagnosis Date  Anxiety   Back pain   Cancer (HCC)   Depression   Emphysema   Esophageal spasm   Lung nodule   OSA (obstructive sleep apnea)   Osteomalacia   Personal history of radiation therapy   Vertigo   Vitamin D  deficiency  Past Surgical History: Past Surgical History: Procedure Laterality Date  ABDOMINAL HYSTERECTOMY    APPENDECTOMY    BREAST BIOPSY Right 08/31/2020  BREAST EXCISIONAL BIOPSY Left 1978  BREAST LUMPECTOMY Right 09/30/2020  BREAST LUMPECTOMY WITH RADIOACTIVE SEED LOCALIZATION Right 09/30/2020  Procedure: RIGHT BREAST LUMPECTOMY WITH RADIOACTIVE SEED LOCALIZATION;  Surgeon: Curvin Deward MOULD, MD;  Location: [facility];  Service: General;  Laterality: Right;  CARPAL TUNNEL RELEASE    IR CT HEAD LTD  06/25/2024  IR PERCUTANEOUS ART THROMBECTOMY/INFUSION INTRACRANIAL INC DIAG ANGIO  06/25/2024  RADIOLOGY WITH ANESTHESIA N/A 06/25/2024  Procedure: RADIOLOGY WITH ANESTHESIA;  Surgeon: Dolphus Carrion, MD;  Location: [facility] OR;  Service: Radiology;  Laterality: N/A;  ROTATOR CUFF REPAIR   Damien Blumenthal, M.A., CCC-SLP Speech Language Pathology, Acute Rehabilitation Services Secure Chat preferred [phone] 07/08/2024, 11:13 AM          Blood pressure (!) 122/41, pulse (!) 57, temperature 97.8 F (36.6 C), resp. rate 19, height 5' 1 (1.549 m), weight 71.7 kg, SpO2 99%.   Medical Problem List and Plan: 1. Functional deficits secondary to R MCA stroke             -patient may  shower             -ELOS/Goals: 16-2 days- min-mod A             Admit to CIR 2.  Antithrombotics: -DVT/anticoagulation:  Pharmaceutical: Lovenox              -antiplatelet therapy: Continue ASA.  --Plavix recommended if no further procedures planned. Question need for PEG.  3. Pain Management: Continues to have issues with pain/back spasms. Last used tylenol  2  days ago.             --sedated from Flexeril  5 mg/tramadol  50 mg this afternoon. Will decrease doses.  4. Mood/Behavior/Sleep: LCSW to follow for evaluation and support.              --reports of hallucination/encephalopath. Has has a lot of confusion at nights and has been been sleeping a lot during the day per daughter  --has chronic insomnia-->used to just lie in bed and  sleeps till 10 am usually.  --Will schedule melatonin at 8 pm tonight. Sleep wake chart to monitor sleep              -antipsychotic agents: N/A 5. Neuropsych/cognition: This patient may be intermittently capable of making decisions on her own behalf. 6. Skin/Wound Care: Routine pressure relief measures.  7. Fluids/Electrolytes/Nutrition: monitor I/O. Check  CMET in am. Continue tube feeds-->does not seem to have water  flushes but renal status WNL.  8.  E coli bacteremia: Treated with 10 day course of antibiotics. Ceftriaxone -->Duricef thru 07/14. Thrombocytopenia/leucocytosis has resolved.              --Hematuria has resolved. No dysuria.  9. Chronic back/Right knee pain: Continue voltaren  gel qid to knee and back till family bring in the biofreeze (which works better)             --Lidocaine  patches at nights to help with back spasms. Decrease flexeril  to 2.5 mg every 8 hours prn             --schedule tylenol  500 mg qid as LFTs resolved.  10. Abnormal LFTs: Have resolved and Lipitor added on 07/19. Continue to monitor weekly for  now.  11. COPD/OSA: Has CPAP but not using PTA. Continue Yupelri  nebs daily (in place of Spiriva).  --Used albuterol  every night PTA. .  12. Hypomagnesemia: Malnutrition? Recheck level in am as albumin levels also low.  13. Anemia: Monitor for signs of bleeding. H/H recovering from 13-->9.9-->11.2 14.  Bradycardia: HR note to be in 51 to 61 range and had boderline prolonged Qtc on admission. Likely due to stroke.  --Check EKG for baseline.  15.  H/o MDD/anxiety: Stable on Lexapro .               Sharlet GORMAN Schmitz, PA-C 07/08/2024   I have personally performed a face to face diagnostic evaluation of this patient and formulated the key components of the plan.  Additionally, I have personally reviewed laboratory data, imaging studies, as well as relevant notes and concur with the physician assistant's documentation above.   The patient's status has not changed from the original H&P.  Any changes in documentation from the acute care chart have been noted above.

## 2024-07-09 DIAGNOSIS — I63511 Cerebral infarction due to unspecified occlusion or stenosis of right middle cerebral artery: Secondary | ICD-10-CM | POA: Diagnosis not present

## 2024-07-09 LAB — CBC WITH DIFFERENTIAL/PLATELET
Abs Immature Granulocytes: 0.09 K/uL — ABNORMAL HIGH (ref 0.00–0.07)
Basophils Absolute: 0.1 K/uL (ref 0.0–0.1)
Basophils Relative: 1 %
Eosinophils Absolute: 0.3 K/uL (ref 0.0–0.5)
Eosinophils Relative: 4 %
HCT: 33.9 % — ABNORMAL LOW (ref 36.0–46.0)
Hemoglobin: 11.3 g/dL — ABNORMAL LOW (ref 12.0–15.0)
Immature Granulocytes: 1 %
Lymphocytes Relative: 18 %
Lymphs Abs: 1.3 K/uL (ref 0.7–4.0)
MCH: 31.5 pg (ref 26.0–34.0)
MCHC: 33.3 g/dL (ref 30.0–36.0)
MCV: 94.4 fL (ref 80.0–100.0)
Monocytes Absolute: 0.6 K/uL (ref 0.1–1.0)
Monocytes Relative: 9 %
Neutro Abs: 4.8 K/uL (ref 1.7–7.7)
Neutrophils Relative %: 67 %
Platelets: 237 K/uL (ref 150–400)
RBC: 3.59 MIL/uL — ABNORMAL LOW (ref 3.87–5.11)
RDW: 14 % (ref 11.5–15.5)
WBC: 7.1 K/uL (ref 4.0–10.5)
nRBC: 0 % (ref 0.0–0.2)

## 2024-07-09 LAB — GLUCOSE, CAPILLARY
Glucose-Capillary: 104 mg/dL — ABNORMAL HIGH (ref 70–99)
Glucose-Capillary: 115 mg/dL — ABNORMAL HIGH (ref 70–99)
Glucose-Capillary: 116 mg/dL — ABNORMAL HIGH (ref 70–99)
Glucose-Capillary: 117 mg/dL — ABNORMAL HIGH (ref 70–99)

## 2024-07-09 LAB — COMPREHENSIVE METABOLIC PANEL WITH GFR
ALT: 24 U/L (ref 0–44)
AST: 25 U/L (ref 15–41)
Albumin: 2.9 g/dL — ABNORMAL LOW (ref 3.5–5.0)
Alkaline Phosphatase: 79 U/L (ref 38–126)
Anion gap: 10 (ref 5–15)
BUN: 23 mg/dL (ref 8–23)
CO2: 26 mmol/L (ref 22–32)
Calcium: 9.4 mg/dL (ref 8.9–10.3)
Chloride: 101 mmol/L (ref 98–111)
Creatinine, Ser: 0.68 mg/dL (ref 0.44–1.00)
GFR, Estimated: >60 mL/min (ref >60)
Glucose, Bld: 117 mg/dL — ABNORMAL HIGH (ref 70–99)
Potassium: 4 mmol/L (ref 3.5–5.1)
Sodium: 137 mmol/L (ref 135–145)
Total Bilirubin: 0.6 mg/dL (ref 0.0–1.2)
Total Protein: 5.9 g/dL — ABNORMAL LOW (ref 6.5–8.1)

## 2024-07-09 LAB — MAGNESIUM: Magnesium: 2.1 mg/dL (ref 1.7–2.4)

## 2024-07-09 LAB — VITAMIN D 25 HYDROXY (VIT D DEFICIENCY, FRACTURES): Vit D, 25-Hydroxy: 44.8 ng/mL (ref 30–100)

## 2024-07-09 LAB — BRAIN NATRIURETIC PEPTIDE: B Natriuretic Peptide: 100.5 pg/mL — ABNORMAL HIGH (ref 0.0–100.0)

## 2024-07-09 LAB — PHOSPHORUS: Phosphorus: 5.6 mg/dL — ABNORMAL HIGH (ref 2.5–4.6)

## 2024-07-09 MED ORDER — FREE WATER
100.0000 mL | Status: DC
  Administered 2024-07-09 – 2024-07-15 (×33): 100 mL

## 2024-07-09 MED ORDER — VITAMIN D 25 MCG (1000 UNIT) PO TABS
1000.0000 [IU] | ORAL_TABLET | Freq: Every day | ORAL | Status: DC
  Administered 2024-07-09 – 2024-07-11 (×3): 1000 [IU] via ORAL
  Filled 2024-07-09 (×3): qty 1

## 2024-07-09 MED ORDER — OSMOLITE 1.5 CAL PO LIQD
1000.0000 mL | ORAL | Status: DC
  Administered 2024-07-09 – 2024-07-14 (×4): 1000 mL
  Filled 2024-07-09 (×4): qty 1000

## 2024-07-09 MED ORDER — L-METHYLFOLATE-B6-B12 3-35-2 MG PO TABS
1.0000 | ORAL_TABLET | Freq: Every day | ORAL | Status: DC
  Administered 2024-07-09 – 2024-07-13 (×5): 1 via ORAL
  Filled 2024-07-09 (×5): qty 1

## 2024-07-09 NOTE — Progress Notes (Signed)
 Initial Nutrition Assessment  DOCUMENTATION CODES:   Non-severe (moderate) malnutrition in context of social or environmental circumstances (aging, decline in function, poor appetite)  INTERVENTION:  Initiate tube feeding via Cortrak: Osmolite 1.5 at 45 ml/h over 20 hours (900 ml per day)  Prosource TF20 60 ml daily  Provides 1430 kcal, 76 gm protein, 685 ml free water  daily  FWF 175mL q 6 hr (provides additional 728mL= 1385 total free water  daily)  Daily MVI w/ minerals  Pt will likely need long term use of tube feeds, recommend PEG placement  NUTRITION DIAGNOSIS:   Moderate Malnutrition related to social / environmental circumstances (aging, decline in function, poor appetite) as evidenced by moderate fat depletion, mild muscle depletion.  GOAL:   Patient will meet greater than or equal to 90% of their needs  MONITOR:   TF tolerance  REASON FOR ASSESSMENT:   Consult Enteral/tube feeding initiation and management, Assessment of nutrition requirement/status  ASSESSMENT:   Pt with PMH of HLD, PAD, COPD with emphysema, OSA, R breast ca s/p lumpectomy/XRT/hormone therapy admitted 7/5 for RUQ pain, N/V, and poor PO intake. Dx with acalculous cholecystitis and E.coli bacteremia. Recent admission 7/5 and discharged to [facility] 7/22  7/5 - admitted; clears 7/8 - diet advanced to fulls; MRCP poor study, HIDA negative fro cholecystitis 7/9 - tx to ICU and intubated; dx with R MCA stroke s/p TNK, IR for mechanical thrombectomy which reoccluded 7/10- extubated; awaiting SLP eval 7/11 - MBS, NPO recommended, cortrak placed 7/16 - SLP BSE, NPO 7/21 - SLP BSE, NPO but planning for MBS 7/22 - MBS, NPO; discharged to CIR  Pt with recent admission for cholecystitis and E. Coli bacteremia. Pt transferred to CIR following discharge from inpatient. No family in room at time of assessment to provide hx but per chart review, pt has been tolerating tube feeds well with no GI discomforts. Pt  unable to answer most questions due to confusion, but disclosed she felt nauseous. Pt had just finished two morning therapy sessions and also complained of pain so nausea may be attributed to work with therapy since tube feedings had been held all morning. RN reported pt has not reported any nausea to her requiring any medication. Discussed increasing tube feed rate to adjust for hours of therapy will pt will not be hooked up to tube feeds, pt understands. Reported update to RN and discussed adding Free water  flushes q4 hr. Will continue to monitor tolerance of tube feeds and adjust as needed.   Pt had recent MBS conducted by SLP which showed severe oral dysphagia and pt will likely need long term use of tube feeds and would benefit from PEG placement.   Nutrition focused physical exam shows moderate fat depletions and mild muscle depletions, indicative of malnutrition. Reports of n/v and poor appetite chronically prior to hospital admission but may be unrelated to any health conditions but rather decline in function and aging.   NUTRITION - FOCUSED PHYSICAL EXAM:  Flowsheet Row Most Recent Value  Orbital Region Moderate depletion  Upper Arm Region Mild depletion  Thoracic and Lumbar Region Moderate depletion  Buccal Region Moderate depletion  Temple Region Mild depletion  Clavicle Bone Region Mild depletion  Clavicle and Acromion Bone Region Mild depletion  Scapular Bone Region Mild depletion  Dorsal Hand Mild depletion  Patellar Region Mild depletion  Anterior Thigh Region Mild depletion  Posterior Calf Region Moderate depletion  Edema (RD Assessment) None  Hair Reviewed  Eyes Reviewed  Mouth Reviewed  Skin  Reviewed  Nails Reviewed   Diet Order:   Diet Order     None       EDUCATION NEEDS:   No education needs have been identified at this time  Skin:  Skin Assessment: Reviewed RN Assessment  Last BM:  7/20 type 6  Height:   Ht Readings from Last 1 Encounters:   07/08/24 5' 1 (1.549 m)   Weight:   Wt Readings from Last 1 Encounters:  07/09/24 67.8 kg   Ideal Body Weight:  47.8 kg  BMI:  Body mass index is 28.24 kg/m.  Estimated Nutritional Needs:   Kcal:  1400-1600  Protein:  70-85g  Fluid:  1.4-1.6L   Josette Glance, MS, RDN, LDN Clinical Dietitian I Please reach out via secure chat

## 2024-07-09 NOTE — Progress Notes (Signed)
 Inpatient Rehabilitation  Patient information reviewed and entered into eRehab system by [facility]. Karen Kays., CCC/SLP, PPS Coordinator.  Information including medical coding, functional ability and quality indicators will be reviewed and updated through discharge.

## 2024-07-09 NOTE — Progress Notes (Signed)
 Inpatient Rehabilitation Admission Medication Review by a Pharmacist  A complete drug regimen review was completed for this patient to identify any potential clinically significant medication issues.  High Risk Drug Classes Is patient taking? Indication by Medication  Antipsychotic Yes PRN Prochlorperazine  (PO, PR on IV) - nausea  Anticoagulant Yes Enoxaparin  - VTE prophylaxis  Antibiotic No   Opioid Yes PRN Tylenol  #3 - moderate pain PRN Tramadol  - severe pain  Antiplatelet Yes Aspirin  81 mg - CVA prophylaxis  Hypoglycemics/insulin  No   Vasoactive Medication No   Chemotherapy No   Other Yes Acetaminophen  500 mg QID - pain control Atorvastatin  - hyperlipidemia Escitalopram  - depression/ anxiety Diclofenac  gel (knees) and Lidocaine  patches (back) - topical pain relief Melatonin - sleep/ insomnia Yupelri  - COPD Vitamin D , Metanx, Multivitamin - supplements  PRNs: Albuterol  nebs - wheezing, shortness of breath Artificial Tears - dry eyes Maalox - indigestion Cyclobenzaprine  - muscle spasms Diphenhydramine  - itching Guaifenesin -dextromethorphan - cough SL Nitroglycerin  - chest pain Chloraseptic spray - sore throat Bisacodyl , Miralax , Fleets enema - constipation     Type of Medication Issue Identified Description of Issue Recommendation(s)  Drug Interaction(s) (clinically significant)     Duplicate Therapy     Allergy     No Medication Administration End Date     Incorrect Dose     Additional Drug Therapy Needed  Neuro recommended Clopidogrel if no further procedures. Question need for PEG. Anticipate Clopidogrel will be added once no further procedures needed.  Significant med changes from prior encounter (inform family/care partners about these prior to discharge). Bupropion  XL discontinued. New Diclofenac  gel, lidocaine  patches, PRN Tylenol  #3, Cyclobenazprine, Tramadol , laxatives. Vitamin D  dose decreased (normal level 7/23) Communicate changes with patient/family  prior to discharge.  Other  DC summary indicates plan to resume Pantoprazole . Cannot crush. Meds currently given per tube.   Yupelri  nebs substituted for Spriva inhaler and Albuterol  nebs for inhaler. Could consider Omeprazole suspension per tube if PPI clinically warranted during CIR admit.   Resume Spiriva and PRN Albuterol  inhalers at discharge.    Clinically significant medication issues were identified that warrant physician communication and completion of prescribed/recommended actions by midnight of the next day:  No  Pharmacist comments:  - Neuro recommended Clopidogrel if no further procedures; question need for PEG. - monitor prn Tylenol  #3 use with concurrent Tylenol  500 mg QID schedule doses.  Time spent performing this drug regimen review (minutes):  20   [name], [place] 07/09/2024 12:21 PM

## 2024-07-09 NOTE — Evaluation (Signed)
 Physical Therapy Assessment and Plan  Patient Details  Name: [name] MRN: [id number] Date of Birth: 1932/09/23  PT Diagnosis: Abnormal posture, Cognitive deficits, Coordination disorder, Hemiparesis non-dominant, Impaired cognition, Impaired sensation, Low back pain, Muscle spasms, and Muscle weakness Rehab Potential: Fair ELOS: 24-26 days   Today's Date: 07/09/2024 PT Individual Time: 1300-1415 PT Individual Time Calculation (min): 75 min    Hospital Problem: Principal Problem:   Acute ischemic right MCA stroke [facility])   Past Medical History:  Past Medical History:  Diagnosis Date   Anxiety    Back pain    Cancer (HCC)    Depression    Emphysema    Esophageal spasm    Lung nodule    OSA (obstructive sleep apnea)    Osteomalacia    Personal history of radiation therapy    Vertigo    Vitamin D  deficiency    Past Surgical History:  Past Surgical History:  Procedure Laterality Date   ABDOMINAL HYSTERECTOMY     APPENDECTOMY     BREAST BIOPSY Right 08/31/2020   BREAST EXCISIONAL BIOPSY Left 1978   BREAST LUMPECTOMY Right 09/30/2020   BREAST LUMPECTOMY WITH RADIOACTIVE SEED LOCALIZATION Right 09/30/2020   Procedure: RIGHT BREAST LUMPECTOMY WITH RADIOACTIVE SEED LOCALIZATION;  Surgeon: Curvin Deward MOULD, MD;  Location: [facility];  Service: General;  Laterality: Right;   CARPAL TUNNEL RELEASE     IR CT HEAD LTD  06/25/2024   IR PERCUTANEOUS ART THROMBECTOMY/INFUSION INTRACRANIAL INC DIAG ANGIO  06/25/2024   RADIOLOGY WITH ANESTHESIA N/A 06/25/2024   Procedure: RADIOLOGY WITH ANESTHESIA;  Surgeon: Dolphus Carrion, MD;  Location: [facility];  Service: Radiology;  Laterality: N/A;   ROTATOR CUFF REPAIR      Assessment & Plan Clinical Impression: Kirsten Reuter. Taylor is an 89 year old R handed  female with history of OSA, macular degeneration, MDD, dysphagia, COPD/emphysema,  right breast cancer s/p lumpectomy/XRT, who was admitted to  [facility] on 06/21/24 with 3-4 day hx of  RUQ abdominal pain w/N/V/D, reports of fever and abnormal LFTs. She was felt to have possible acalculous cholecystitis and started on IVF and antibiotics. MRCP 07/06 was negative for CBD stones, dilatation or acute cholecystitis and showed probable hemangioma R-hepatic lobe. Noted to have positive UTI. Blood cultures positive for  E coli in 1/4 bottles and maintained on ceftriaxone /Flagyl . On 07/08, she developed DOE due to fluid overload w/BNP 622 as well as hypokalemia with K -2.9 which was treated with IV diuresis as well as runs of K. 2 D echo done revealing EF 60-65% with  G2DD .  Patient transferred to CIR on 07/08/2024 .   Patient currently requires total with mobility secondary to muscle weakness, muscle joint tightness, and muscle paralysis, decreased visual acuity and field cut, decreased midline orientation, decreased attention to left, left side neglect, and decreased motor planning, and decreased initiation, decreased attention, decreased awareness, decreased safety awareness, decreased memory, and delayed processing.  Prior to hospitalization, patient was independent  with mobility and lived with Alone in a House home.  Home access is  Level entry.  Patient will benefit from skilled PT intervention to maximize safe functional mobility, minimize fall risk, and decrease caregiver burden for planned discharge home with 24 hour supervision.  Anticipate patient will benefit from follow up HH at discharge.  PT - End of Session Activity Tolerance: Tolerates < 10 min activity, no significant change in vital signs Endurance Deficit: Yes Endurance Deficit Description: difficulty maintaining sitting balance, head  in neutral PT Assessment Rehab Potential (ACUTE/IP ONLY): Fair PT Barriers to Discharge: Home environment access/layout;Behavior;Lack of/limited family support;Incontinence;Medication compliance PT Patient demonstrates impairments in the following area(s):  Balance;Behavior;Edema;Endurance;Motor;Nutrition;Pain;Perception;Safety;Sensory;Skin Integrity PT Transfers Functional Problem(s): Bed Mobility;Bed to Chair;Car;Floor;Furniture PT Locomotion Functional Problem(s): Ambulation;Wheelchair Mobility;Stairs PT Plan PT Intensity: Minimum of 1-2 x/day ,45 to 90 minutes PT Frequency: Total of 15 hours over 7 days of combined therapies PT Duration Estimated Length of Stay: 24-26 days PT Treatment/Interventions: Ambulation/gait training;Balance/vestibular training;Community reintegration;Functional electrical stimulation;Patient/family education;Stair training;UE/LE Coordination activities;UE/LE Strength taining/ROM;DME/adaptive equipment instruction;Cognitive remediation/compensation;Splinting/orthotics;Pain management;Disease management/prevention;Neuromuscular re-education;Skin care/wound management;Therapeutic Exercise;Wheelchair propulsion/positioning;Visual/perceptual remediation/compensation;Therapeutic Activities;Psychosocial support;Functional mobility training;Discharge planning PT Transfers Anticipated Outcome(s): mod A with transfers PT Locomotion Anticipated Outcome(s): mod A with locomotion PT Recommendation Follow Up Recommendations: Home health PT Patient destination: Home Equipment Recommended: 3 in 1 bedside comode;To be determined   PT Evaluation Precautions/Restrictions Precautions Precautions: Fall Recall of Precautions/Restrictions: Impaired Restrictions Weight Bearing Restrictions Per Provider Order: No General Chart Reviewed: Yes Family/Caregiver Present: Yes Vital SignsTherapy Vitals Temp: 97.7 F (36.5 C) Pulse Rate: 65 Resp: 16 BP: 139/64 Patient Position (if appropriate): Sitting Oxygen Therapy SpO2: 96 % O2 Device: Room Air Pain Pain Assessment Pain Scale: 0-10 Pain Score: 7  Pain Location: Back Pain Interference Pain Interference Pain Effect on Sleep: 2. Occasionally Pain Interference with Therapy  Activities: 2. Occasionally Pain Interference with Day-to-Day Activities: 2. Occasionally Home Living/Prior Functioning Home Living Available Help at Discharge: Home health Type of Home: House Home Access: Level entry Home Layout: One level Bathroom Shower/Tub: Walk-in shower;Door Bathroom Toilet: Handicapped height Bathroom Accessibility: Yes Additional Comments: May need some additional grab bars for the shower and toilet areas.  Lives With: Alone Prior Function Level of Independence: Independent with basic ADLs  Able to Take Stairs?: No Driving: No Vocation: Retired Optometrist - History Ability to See in Adequate Light: 0 Adequate Perception Perception: Impaired Preception Impairment Details: Inattention/Neglect;Spatial orientation Praxis Praxis: Impaired Praxis Impairment Details: Initiation;Motor planning;Organization  Cognition Overall Cognitive Status: Within Functional Limits for tasks assessed Arousal/Alertness: Lethargic Orientation Level: Oriented X4 Year: 2025 Month: July Attention: Sustained Decreased Short Term Memory: Functional basic Awareness: Impaired Initiating: Impaired Sensation Sensation Light Touch: Impaired by gross assessment Hot/Cold: Impaired by gross assessment Proprioception: Impaired by gross assessment Stereognosis: Not tested Additional Comments: Nearly absent sensation LUE, Lying on arm upon arrival - unaware Coordination Gross Motor Movements are Fluid and Coordinated: No Fine Motor Movements are Fluid and Coordinated: No Motor  Motor Motor: Hemiplegia;Abnormal postural alignment and control;Motor impersistence;Abnormal tone Motor - Skilled Clinical Observations: Tension in muscles of Left arm - but no volitional movement, poor postural control - pushes backward and left   Trunk/Postural Assessment  Cervical Assessment Cervical Assessment: Exceptions to [facility] Thoracic Assessment Thoracic Assessment: Exceptions to  [facility] Lumbar Assessment Lumbar Assessment: Exceptions to [facility] Postural Control Postural Control: Deficits on evaluation Head Control: inability to hold head up, endurance issue Trunk Control: pushes left and posterior Righting Reactions: absent  Balance Balance Balance Assessed: Yes Static Sitting Balance Static Sitting - Balance Support: Right upper extremity supported;Feet supported Static Sitting - Level of Assistance: 2: Max assist Static Sitting - Comment/# of Minutes: pt pushes to L, cannot right Dynamic Sitting Balance Dynamic Sitting - Balance Support: Right upper extremity supported;Left upper extremity supported;Feet supported;During functional activity Dynamic Sitting - Level of Assistance: 2: Max assist Dynamic Sitting Balance - Compensations: pushes L Reach (Patient is able to reach ___ inches to right, left, forward, back): pt is able to reach  with max assist Static Standing Balance Static Standing - Level of Assistance: Not tested (comment) Static Standing - Comment/# of Minutes: pt could not stand today for safety reasons Extremity Assessment      RLE Assessment RLE Assessment: Within Functional Limits Passive Range of Motion (PROM) Comments: wfl Active Range of Motion (AROM) Comments: wfl General Strength Comments: 3+ grossly LLE Assessment LLE Assessment: Within Functional Limits Passive Range of Motion (PROM) Comments: wfl Active Range of Motion (AROM) Comments: 0/5 General Strength Comments: trace hip adduction  Care Tool Care Tool Bed Mobility Roll left and right activity   Roll left and right assist level: Maximal Assistance - Patient 25 - 49%    Sit to lying activity Sit to lying activity did not occur: Safety/medical concerns Sit to lying assist level: Total Assistance - Patient < 25%    Lying to sitting on side of bed activity Lying to sitting on side of bed activity did not occur: the ability to move from lying on the back to sitting on the side of  the bed with no back support.: Safety/medical concerns Lying to sitting on side of bed assist level: the ability to move from lying on the back to sitting on the side of the bed with no back support.: Total Assistance - Patient < 25%     Care Tool Transfers Sit to stand transfer Sit to stand activity did not occur: Safety/medical concerns Sit to stand assist level: Maximal Assistance - Patient 25 - 49%    Chair/bed transfer Chair/bed transfer activity did not occur: Safety/medical concerns Chair/bed transfer assist level: Total Assistance - Patient < 25%    Licensed conveyancer transfer activity did not occur: Safety/medical concerns        Care Tool Locomotion Ambulation Ambulation activity did not occur: Safety/medical concerns        Walk 10 feet activity Walk 10 feet activity did not occur: Safety/medical concerns       Walk 50 feet with 2 turns activity Walk 50 feet with 2 turns activity did not occur: Safety/medical concerns      Walk 150 feet activity Walk 150 feet activity did not occur: Safety/medical concerns      Walk 10 feet on uneven surfaces activity Walk 10 feet on uneven surfaces activity did not occur: Safety/medical concerns      Stairs Stair activity did not occur: Safety/medical concerns        Walk up/down 1 step activity Walk up/down 1 step or curb (drop down) activity did not occur: Safety/medical concerns      Walk up/down 4 steps activity Walk up/down 4 steps activity did not occur: Safety/medical concerns      Walk up/down 12 steps activity Walk up/down 12 steps activity did not occur: Safety/medical concerns      Pick up small objects from floor Pick up small object from the floor (from standing position) activity did not occur: Safety/medical concerns      Wheelchair Is the patient using a wheelchair?: No   Wheelchair activity did not occur: Safety/medical concerns      Wheel 50 feet with 2 turns activity Wheelchair 50 feet with 2 turns  activity did not occur: Safety/medical concerns    Wheel 150 feet activity Wheelchair 150 feet activity did not occur: Safety/medical concerns      Refer to Care Plan for Long Term Goals  SHORT TERM GOAL WEEK 1 PT Short Term Goal 1 (Week 1): Pt will iniate sitting balance with training  with L sided wedge PT Short Term Goal 2 (Week 1): Pt will attempt sit to stand training PT Short Term Goal 3 (Week 1): Pt will attempt slide board transfer training to explore transfer options PT Short Term Goal 4 (Week 1): Pt will continue bed mobility training focusing on reducing caregiver burden  Recommendations for other services: None   Skilled Therapeutic Intervention Mobility Bed Mobility Bed Mobility: Rolling Left;Left Sidelying to Sit Rolling Left: Moderate Assistance - Patient 50-74% Left Sidelying to Sit: Dependent - Patient equal 0% Transfers Transfers: Sit to Stand;Stand to Sit Sit to Stand: Maximal Assistance - Patient 25-49% Stand to Sit: Maximal Assistance - Patient 25-49% Transfer (Assistive device): None Locomotion  Gait Ambulation: No Gait Gait: No Stairs / Additional Locomotion Stairs: No Wheelchair Mobility Wheelchair Mobility: No  Treatment Note  Patient seated in recliner on entrance to room. Patient alert and agreeable to PT session.   Patient reported not able to recall as information about her previous therapies. Pt reported that she is tired and has worked all day. Pt was able to joke and laugh during the session and recall several facts about her past. Pt has severe L lean in sitting and requires maxA to maintain balance. Pt is flaccid both extremities on L, and did not attempt to stand today. Pt sat at the edge of mat for 25 minutes and practiced sitting balance, requiring several breaks over the period. Pt also has some L neglect, and struggles to get eyes to turn left, even when the head rotates L.   Therapeutic Activity: Bed Mobility: Pt performed sit to  supine with total assistance 2+ to helpers.  Transfers: Pt performed all transfers with dependent 2+ squat pivot.   Patient left in bed at end of session with brakes locked, bed alarm set, and all needs within reach.   Discharge Criteria: Patient will be discharged from PT if patient refuses treatment 3 consecutive times without medical reason, if treatment goals not met, if there is a change in medical status, if patient makes no progress towards goals or if patient is discharged from hospital.  The above assessment, treatment plan, treatment alternatives and goals were discussed and mutually agreed upon: by patient and by family  Johnetta Cordon 07/09/2024, 3:56 PM

## 2024-07-09 NOTE — Evaluation (Addendum)
 Speech Language Pathology Assessment and Plan  Patient Details  Name: [name] MRN: [id number] Date of Birth: December 28, 1931  SLP Diagnosis: Dysarthria;Cognitive Impairments;Dysphagia  Rehab Potential: Fair ELOS: 3-4 weeks   Today's Date: 07/09/2024 SLP Individual Time: 8981-8886 SLP Individual Time Calculation (min): 55 min  Hospital Problem: Principal Problem:   Acute ischemic right MCA stroke [facility])  Past Medical History:  Past Medical History:  Diagnosis Date   Anxiety    Back pain    Cancer (HCC)    Depression    Emphysema    Esophageal spasm    Lung nodule    OSA (obstructive sleep apnea)    Osteomalacia    Personal history of radiation therapy    Vertigo    Vitamin D  deficiency    Past Surgical History:  Past Surgical History:  Procedure Laterality Date   ABDOMINAL HYSTERECTOMY     APPENDECTOMY     BREAST BIOPSY Right 08/31/2020   BREAST EXCISIONAL BIOPSY Left 1978   BREAST LUMPECTOMY Right 09/30/2020   BREAST LUMPECTOMY WITH RADIOACTIVE SEED LOCALIZATION Right 09/30/2020   Procedure: RIGHT BREAST LUMPECTOMY WITH RADIOACTIVE SEED LOCALIZATION;  Surgeon: Curvin Deward MOULD, MD;  Location: [facility];  Service: General;  Laterality: Right;   CARPAL TUNNEL RELEASE     IR CT HEAD LTD  06/25/2024   IR PERCUTANEOUS ART THROMBECTOMY/INFUSION INTRACRANIAL INC DIAG ANGIO  06/25/2024   RADIOLOGY WITH ANESTHESIA N/A 06/25/2024   Procedure: RADIOLOGY WITH ANESTHESIA;  Surgeon: Dolphus Carrion, MD;  Location: [facility] OR;  Service: Radiology;  Laterality: N/A;   ROTATOR CUFF REPAIR      Assessment / Plan / Recommendation Clinical Impression  [name] is an 89 year old R handed  female with history of OSA, macular degeneration, MDD, dysphagia, COPD/emphysema,  right breast cancer s/p lumpectomy/XRT, who was admitted to  [facility] on 06/21/24 with 3-4 day hx of RUQ abdominal pain w/N/V/D, reports of fever and abnormal LFTs. She was felt to have possible acalculous  cholecystitis and started on IVF and antibiotics. MRCP 07/06 was negative for CBD stones, dilatation or acute cholecystitis and showed probable hemangioma R-hepatic lobe. Noted to have positive UTI. Blood cultures positive for  E coli in 1/4 bottles and maintained on ceftriaxone /Flagyl . On 07/08, she developed DOE due to fluid overload w/BNP 622 as well as hypokalemia with K -2.9 which was treated with IV diuresis as well as runs of K. 2 D echo done revealing EF 60-65% with  G2DD    On 07/09 pm, she was found to have AMS with left facial droop, right gaze deviation, left sided weakness and slurred speech. CTA head/neck showed abrupt occlusion of distal M1 segment of R-MCA, TNK administered and she underwent cerebral angio with endovascular TICI 2 B revascularization of R-MCA by Dr. Dolphus. Use of rescue stent deferred due to due to concerns of increased risk of intracranial hemorrhage w/DAPT.  Post procedure CT head with small SAH and tolerated extubation by 07/10. MRI brain done revealing evolving R-MCA infarct with mild associated petechial blood products right frontoparietal region and small volume acute SAH base of sylvian fissure. Dr. Jerri felt that stroke likely due to large vessel disease and on ASA given SAH.   NPO recommended and cortak placed for nutritional support. MBS done revealing moderate oropharyngeal dysphagia likely impacted by cervical osteophytes and prominent cricopharyngeus and NPO recommended.  PEG has been discussed with family by Dr. Rosemarie and issues with nausea treated with decrease in rate of  TF. Repeat MBS 07/22 showed severe oropharyngeal dysphagia with delay in initiation and gross aspiration and inability to clear aspirate--to continue NPO except ice chips.  Hospital course also significant for encephalopathy with hallucinations, back not relived by flexeril  and tramadol  added. Voltaren  gel added for knee pain, sleep wake disruption,    Patient with resultant left facial droop  with dysarthria, dysphagia, dense LUE>LLE weakness with sensory deficits, left hemianopsia with no blink to threat and able to follow one step commands with multimodal cues and increased time. She lives alone, was sedentary and ambulated with use of RW due to hx of truncal ataxia/ankle instability. Had aide 5 hrs/day for home/medication management and family plans on providing 24/7 care after discharge. Therapy has been working with patient who is showing improvement in alertness, working on sitting balance at EOB as well as standing attempts with left knee blocked. She requires max to total assist overall and CIR recommended due to functional decline. Pt admitted to CIR on 07/08/24.  Cognitive/ Linguistic: Cognition assessed informally due to increasing fatigue at end of session and report of discomfort from sitting in chair. Pt oriented to temporal, spatial, and situational concepts. Portions of SLUMS administered revealing deficits in recall, executive function, and attention. Further formal assessment warranted.   Swallowing: Pt presents with a severe oropharyngeal dysphagia. Cortrak in place at time of assessment. She recalled recommendations for NPO and verbalized her only desire has been for water . Oral mechanism exam revealed left side facial droop, left side labial weakness, and sparse dentition. SLP facilitated completion of oral hygiene via suction toothbrush. Pt presented with small ice chips with minimal oral manipulation observed. Pt with multiple swallows per bolus. S/s aspiration observed in 1/4 trials. She consumed 1/2 tsp bolus of water  with immediate cough x2 and again demo multiple swallows. SLP recommending pt remain NPO with oral hygiene to be completed QID. SLP also recommending small amounts of ice chips (~5 at a time) after oral hygiene.   Dysarthria: Pt also presents with mild dysarthria with contributing oral motor deficits as mentioned above. Dysarthria c/b decreased vocal intensity,  flat affect, and minimal imprecise articulation. Pt also presents with hoarse vocal quality and c/o a sore throat. In spite of deficits pt was 100% intelligible at the phrase level. No further intervention warranted at this time.   Pt would benefit from skilled SLP services to maximize dysphagia, cognition, and dysarthria in order to maximize her independence prior to discharge. Anticipate pt will require 24 hour supervision at home and f/u home health SLP services.     Skilled Therapeutic Interventions          BSE, informal assessment measures, and portions of SLUMS administered. Please see full report for additional details.     SLP Assessment  Patient will need skilled Speech Lanaguage Pathology Services during CIR admission    Recommendations  SLP Diet Recommendations: NPO;Ice chips PRN after oral care Medication Administration: Via alternative means Oral Care Recommendations: Oral care QID Patient destination: Home Follow up Recommendations: Home Health SLP;Outpatient SLP;24 hour supervision/assistance Equipment Recommended: To be determined    SLP Frequency 3 to 5 out of 7 days   SLP Duration  SLP Intensity  SLP Treatment/Interventions 3-4 weeks  Minumum of 1-2 x/day, 30 to 90 minutes  Cognitive remediation/compensation;Functional tasks;Therapeutic Activities;Internal/external aids;Patient/family education;Dysphagia/aspiration precaution training;Therapeutic Exercise;Speech/Language facilitation    Pain Pain Assessment Pain Scale: 0-10 Pain Score: 7  Pain Type: Acute pain Pain Location: Back Pain Orientation: Upper Pain Descriptors / Indicators:  Aching Pain Onset: On-going Pain Intervention(s): Distraction;Repositioned  Prior Functioning Cognitive/Linguistic Baseline: Within functional limits Type of Home: House (condo)  Lives With: Alone Available Help at Discharge: Home health Vocation: Retired  SLP Evaluation Cognition Overall Cognitive Status:  Impaired/Different from baseline Arousal/Alertness: Lethargic Orientation Level: Oriented X4 Year: 2025 Month: July Attention: Sustained Sustained Attention: Impaired Sustained Attention Impairment: Functional basic Memory: Impaired Memory Impairment: Retrieval deficit;Decreased short term memory Decreased Short Term Memory: Functional basic Awareness: Impaired Awareness Impairment: Emergent impairment Problem Solving: Impaired Problem Solving Impairment: Functional basic Executive Function: Initiating;Organizing Organizing: Impaired Organizing Impairment: Functional basic Initiating: Impaired Initiating Impairment: Functional basic Behaviors: Perseveration Safety/Judgment: Impaired  Comprehension Auditory Comprehension Overall Auditory Comprehension: Appears within functional limits for tasks assessed Expression Expression Primary Mode of Expression: Verbal Verbal Expression Overall Verbal Expression: Appears within functional limits for tasks assessed Written Expression Dominant Hand: Right Oral Motor Oral Motor/Sensory Function Overall Oral Motor/Sensory Function: Moderate impairment Facial ROM: Reduced left;Suspected CN VII (facial) dysfunction Facial Symmetry: Abnormal symmetry left;Suspected CN VII (facial) dysfunction Facial Strength: Reduced left;Suspected CN VII (facial) dysfunction Lingual ROM: Reduced left;Suspected CN XII (hypoglossal) dysfunction Lingual Symmetry: Abnormal symmetry left;Suspected CN XII (hypoglossal) dysfunction Lingual Strength: Reduced;Suspected CN XII (hypoglossal) dysfunction Motor Speech Overall Motor Speech: Impaired Respiration: Within functional limits Phonation: Hoarse Resonance: Within functional limits Articulation: Within functional limitis Level of Impairment: Sentence Intelligibility: Intelligible Sentence: 75-100% accurate  Care Tool Care Tool Cognition Ability to hear (with hearing aid or hearing appliances if normally  used Ability to hear (with hearing aid or hearing appliances if normally used): 2. Moderate difficulty - speaker has to increase volume and speak distinctly   Expression of Ideas and Wants Expression of Ideas and Wants: 3. Some difficulty - exhibits some difficulty with expressing needs and ideas (e.g, some words or finishing thoughts) or speech is not clear   Understanding Verbal and Non-Verbal Content Understanding Verbal and Non-Verbal Content: 3. Usually understands - understands most conversations, but misses some part/intent of message. Requires cues at times to understand  Memory/Recall Ability Memory/Recall Ability : That he or she is in a hospital/hospital unit   PMSV Assessment  PMSV Trial Intelligibility: Intelligible Sentence: 75-100% accurate  Bedside Swallowing Assessment General Diet Prior to this Study: NPO;Cortrak/Small bore NG tube Temperature Spikes Noted: No Respiratory Status: Room air History of Recent Intubation: Yes Total duration of intubation (days): 2 days Date extubated: 06/26/24 Behavior/Cognition: Alert;Cooperative;Requires cueing Oral Cavity - Dentition: Dentures, not available;Poor condition;Missing dentition Vision: Functional for self-feeding Patient Positioning: Upright in chair/Tumbleform Baseline Vocal Quality: Normal Volitional Cough: Strong Volitional Swallow: Able to elicit  Oral Care Assessment Oral Assessment  (WDL): Exceptions to WDL Lips: Symmetrical Teeth: Missing (Comment) Tongue: Pink;Moist Mucous Membrane(s): Pink;Moist Saliva: Moist, saliva free flowing Level of Consciousness: Alert Is patient on any of following O2 devices?: None of the above Nutritional status: On tube feedings Oral Assessment Risk : High Risk Ice Chips Ice chips: Impaired Presentation: Spoon Oral Phase Functional Implications: Oral holding Pharyngeal Phase Impairments: Multiple swallows;Cough - Immediate Thin Liquid Thin Liquid: Impaired Presentation:  Spoon Pharyngeal  Phase Impairments: Multiple swallows;Cough - Immediate Nectar Thick Nectar Thick Liquid: Not tested Honey Thick Honey Thick Liquid: Not tested Puree Puree: Not tested Solid Solid: Not tested BSE Assessment Risk for Aspiration Impact on safety and function: Moderate aspiration risk Other Related Risk Factors: History of esophageal-related issues;Lethargy;Cognitive impairment;Deconditioning  Short Term Goals: Week 1: SLP Short Term Goal 1 (Week 1): Pt will trial ice chips/ water  with SLP only in  order to assess potential for initiation of [place] water  protocol SLP Short Term Goal 2 (Week 1): Patient will participate in formal cognitive testing in order to determine current cognitive function. SLP Short Term Goal 3 (Week 1): Patient will recall daily events with 80% accuracy using internal/external memory aids given min assist. SLP Short Term Goal 4 (Week 1): Patient will complete pharyngeal strengthening exercises as appropriate given mod A in order to improve swallow function  Refer to Care Plan for Long Term Goals  Recommendations for other services: None   Discharge Criteria: Patient will be discharged from SLP if patient refuses treatment 3 consecutive times without medical reason, if treatment goals not met, if there is a change in medical status, if patient makes no progress towards goals or if patient is discharged from hospital.  The above assessment, treatment plan, treatment alternatives and goals were discussed and mutually agreed upon: by patient  Joane GORMAN Fuss 07/09/2024, 12:23 PM

## 2024-07-09 NOTE — Plan of Care (Signed)
  Problem: RH Swallowing Goal: LTG Patient will consume least restrictive diet using compensatory strategies with assistance (SLP) Description: LTG:  Patient will consume least restrictive diet using compensatory strategies with assistance (SLP) Flowsheets (Taken 07/09/2024 1220) LTG: Pt Patient will consume least restrictive diet using compensatory strategies with assistance of (SLP): Moderate Assistance - Patient 50 - 74%   Problem: RH Problem Solving Goal: LTG Patient will demonstrate problem solving for (SLP) Description: LTG:  Patient will demonstrate problem solving for basic/complex daily situations with cues  (SLP) Flowsheets (Taken 07/09/2024 1220) LTG: Patient will demonstrate problem solving for (SLP): (moderate complexity) Other (comment) LTG Patient will demonstrate problem solving for: Minimal Assistance - Patient > 75%   Problem: RH Memory Goal: LTG Patient will use memory compensatory aids to (SLP) Description: LTG:  Patient will use memory compensatory aids to recall biographical/new, daily complex information with cues (SLP) Flowsheets (Taken 07/09/2024 1220) LTG: Patient will use memory compensatory aids to (SLP): Minimal Assistance - Patient > 75%

## 2024-07-09 NOTE — Evaluation (Addendum)
 Occupational Therapy Assessment and Plan  Patient Details  Name: [name] MRN: [id number] Date of Birth: Sep 17, 1932  OT Diagnosis: abnormal posture, acute pain, altered mental status, apraxia, cognitive deficits, disturbance of vision, flaccid hemiplegia and hemiparesis, hemiplegia affecting non-dominant side, muscle weakness (generalized), and pain in joint Rehab Potential: Rehab Potential (ACUTE ONLY): Fair ELOS: 21-28 days   Today's Date: 07/09/2024 OT Individual Time: 0850-1005 OT Individual Time Calculation (min): 75 min     Hospital Problem: Principal Problem:   Acute ischemic right MCA stroke [facility])   Past Medical History:  Past Medical History:  Diagnosis Date   Anxiety    Back pain    Cancer (HCC)    Depression    Emphysema    Esophageal spasm    Lung nodule    OSA (obstructive sleep apnea)    Osteomalacia    Personal history of radiation therapy    Vertigo    Vitamin D  deficiency    Past Surgical History:  Past Surgical History:  Procedure Laterality Date   ABDOMINAL HYSTERECTOMY     APPENDECTOMY     BREAST BIOPSY Right 08/31/2020   BREAST EXCISIONAL BIOPSY Left 1978   BREAST LUMPECTOMY Right 09/30/2020   BREAST LUMPECTOMY WITH RADIOACTIVE SEED LOCALIZATION Right 09/30/2020   Procedure: RIGHT BREAST LUMPECTOMY WITH RADIOACTIVE SEED LOCALIZATION;  Surgeon: Curvin Deward MOULD, MD;  Location: [facility];  Service: General;  Laterality: Right;   CARPAL TUNNEL RELEASE     IR CT HEAD LTD  06/25/2024   IR PERCUTANEOUS ART THROMBECTOMY/INFUSION INTRACRANIAL INC DIAG ANGIO  06/25/2024   RADIOLOGY WITH ANESTHESIA N/A 06/25/2024   Procedure: RADIOLOGY WITH ANESTHESIA;  Surgeon: Dolphus Carrion, MD;  Location: [facility] OR;  Service: Radiology;  Laterality: N/A;   ROTATOR CUFF REPAIR      Assessment & Plan Clinical Impression: [name] is an 89 year old R handed  female with history of OSA, macular degeneration, MDD, dysphagia, COPD/emphysema,  right  breast cancer s/p lumpectomy/XRT, who was admitted to  [facility] on 06/21/24 with 3-4 day hx of RUQ abdominal pain w/N/V/D, reports of fever and abnormal LFTs. She was felt to have possible acalculous cholecystitis and started on IVF and antibiotics. MRCP 07/06 was negative for CBD stones, dilatation or acute cholecystitis and showed probable hemangioma R-hepatic lobe. Noted to have positive UTI. Blood cultures positive for  E coli in 1/4 bottles and maintained on ceftriaxone /Flagyl . On 07/08, she developed DOE due to fluid overload w/BNP 622 as well as hypokalemia with K -2.9 which was treated with IV diuresis as well as runs of K. 2 D echo done revealing EF 60-65% with  G2DD    On 07/09 pm, she was found to have AMS with left facial droop, right gaze deviation, left sided weakness and slurred speech. CTA head/neck showed abrupt occlusion of distal M1 segment of R-MCA, TNK administered and she underwent cerebral angio with endovascular TICI 2 B revascularization of R-MCA by Dr. Dolphus. Use of rescue stent deferred due to due to concerns of increased risk of intracranial hemorrhage w/DAPT.  Post procedure CT head with small SAH and tolerated extubation by 07/10. MRI brain done revealing evolving R-MCA infarct with mild associated petechial blood products right frontoparietal region and small volume acute SAH base of sylvian fissure. Dr. Jerri felt that stroke likely due to large vessel disease and on ASA given SAH.   NPO recommended and cortak placed for nutritional support. MBS done revealing moderate oropharyngeal dysphagia likely impacted  by cervical osteophytes and prominent cricopharyngeus and NPO recommended.  PEG has been discussed with family by Dr. Rosemarie and issues with nausea treated with decrease in rate of TF. Repeat MBS 07/22 showed severe oropharyngeal dysphagia with delay in initiation and gross aspiration and inability to clear aspirate--to continue NPO except ice chips.  Hospital course also  significant for encephalopathy with hallucinations, back pain not relieved by flexeril  and tramadol  added. Voltaren  gel added for knee pain, sleep wake disruption,    Patient with resultant  left facial droop with dysarthria, dysphagia, dense LUE>LLE weakness with sensory deficits, left hemianopsia with no blink to threat and able to follow one step commands with multimodal cues and increased time. She lives alone, was sedentary and ambulated with use of RW due to hx of truncal ataxia/ankle instability. Had aide 5 hrs/day for home/medication management and family plans on providing 24/7 care after discharge. Therapy has been working with patient who is showing improvement in alertness, working on sitting balance at EOB as well as standing attempts with left knee blocked. She requires max to total assist overall and CIR recommended due to functional decline.  Patient transferred to CIR on 07/08/2024 .    Patient currently requires total with basic self-care skills secondary to muscle weakness and muscle joint tightness, decreased cardiorespiratoy endurance, impaired timing and sequencing, abnormal tone, unbalanced muscle activation, motor apraxia, decreased coordination, and decreased motor planning, decreased visual perceptual skills and decreased visual motor skills, decreased attention to left, left side neglect, and decreased motor planning, decreased initiation, decreased attention, decreased awareness, decreased problem solving, decreased safety awareness, decreased memory, and delayed processing, and decreased sitting balance, decreased standing balance, decreased postural control, hemiplegia, and decreased balance strategies.  Prior to hospitalization, patient could complete BADL with modified independent .  Patient will benefit from skilled intervention to decrease level of assist with basic self-care skills prior to discharge home with care partner.  Anticipate patient will require moderate physical  assestance and follow up home health.  OT - End of Session Activity Tolerance: Decreased this session Endurance Deficit: Yes Endurance Deficit Description: difficulty maintaining eyes open, head erect OT Assessment Rehab Potential (ACUTE ONLY): Fair OT Patient demonstrates impairments in the following area(s): Balance;Motor;Sensory;Behavior;Skin Integrity;Cognition;Pain;Vision;Perception;Endurance;Safety OT Basic ADL's Functional Problem(s): Grooming;Eating;Bathing;Dressing;Toileting OT Transfers Functional Problem(s): Toilet;Tub/Shower OT Additional Impairment(s): Fuctional Use of Upper Extremity OT Plan OT Intensity: Minimum of 1-2 x/day, 45 to 90 minutes OT Frequency:  5 of 7 days Anticipated destination:  Home with family support Follow Up:  HHOT Equipment:  TBD OT Duration/Estimated Length of Stay: 21-28 days OT Treatment/Interventions: Balance/vestibular training;Disease mangement/prevention;Functional electrical stimulation;Neuromuscular re-education;Patient/family education;Self Care/advanced ADL retraining;Splinting/orthotics;Therapeutic Exercise;UE/LE Coordination activities;Cognitive remediation/compensation;Discharge planning;DME/adaptive equipment instruction;Functional mobility training;Pain management;Psychosocial support;Skin care/wound managment;UE/LE Strength taining/ROM;Therapeutic Activities;Visual/perceptual remediation/compensation;Wheelchair propulsion/positioning Patient/ Family agree to OT plan of care.   Family:  Daughter   OT Evaluation Precautions/Restrictions  Precautions Precautions: Fall Recall of Precautions/Restrictions: Impaired Precaution/Restrictions Comments: Cortrak- NPO Restrictions Weight Bearing Restrictions Per Provider Order: No   Vital Signs Therapy Vitals Pulse Rate: 61 Resp: 16 BP: 137/67 Patient Position (if appropriate): Sitting Oxygen Therapy SpO2: 98 % O2 Device: Room Air Pain Pain Assessment Pain Scale: 0-10 Pain Score: 3   Pain Type: Acute pain;Chronic pain Pain Location: Back Pain Orientation: Posterior Pain Descriptors / Indicators: Aching Pain Onset: On-going Home Living/Prior Functioning Home Living Family/patient expects to be discharged to:: Private residence Living Arrangements: Alone Available Help at Discharge: Home health Type of Home: House (Condo) Home Access: Level entry Home  Layout: One level Bathroom Shower/Tub: Psychologist, counselling, Sport and exercise psychologist: Handicapped height Bathroom Accessibility: Yes  Lives With: Alone IADL History Homemaking Responsibilities: No Current License: No Occupation: Retired Leisure and Hobbies: watch TV, read IADL Comments: Has caregiver from 10-3 M-F Prior Function Level of Independence: Independent with basic ADLs  Able to Take Stairs?: No Driving: No Vocation: Retired Administrator, sports Baseline Vision/History: 1 Wears glasses Ability to See in Adequate Light: 0 Adequate Patient Visual Report: Eye fatigue/eye pain/headache Vision Assessment?: Vision impaired- to be further tested in functional context Additional Comments: R gaze preference but scans to left with cueing Perception  Perception: Impaired Perception-Other Comments: left neglect, pushes to left and backward Praxis Praxis: Impaired Praxis Impairment Details: Initiation;Motor planning;Organization Cognition Cognition Overall Cognitive Status: Impaired/Different from baseline Arousal/Alertness: Lethargic Orientation Level: Person;Place;Situation Person: Oriented Place: Oriented Situation: Oriented Memory: Impaired Memory Impairment: Retrieval deficit;Decreased short term memory Decreased Short Term Memory: Functional basic Attention: Sustained Sustained Attention: Impaired Sustained Attention Impairment: Functional basic Awareness: Impaired Awareness Impairment: Emergent impairment Problem Solving: Impaired Problem Solving Impairment: Functional basic Executive Function:  Initiating;Organizing Organizing: Impaired Organizing Impairment: Functional basic Initiating: Impaired Initiating Impairment: Functional basic Behaviors: Perseveration;Poor frustration tolerance Safety/Judgment: Impaired Brief Interview for Mental Status (BIMS) Repetition of Three Words (First Attempt): 3 Temporal Orientation: Year: Correct Temporal Orientation: Month: Accurate within 5 days Temporal Orientation: Day: Incorrect Recall: Sock: No, could not recall Recall: Blue: Yes, no cue required Recall: Bed: Yes, no cue required BIMS Summary Score: 12 Sensation Sensation Light Touch: Impaired by gross assessment Hot/Cold: Impaired by gross assessment Proprioception: Impaired by gross assessment Stereognosis: Not tested Additional Comments: Nearly absent sensation LUE, Lying on arm upon arrival - unaware Coordination Gross Motor Movements are Fluid and Coordinated: No Fine Motor Movements are Fluid and Coordinated: No Motor  Motor Motor: Hemiplegia;Abnormal postural alignment and control;Motor impersistence;Abnormal tone Motor - Skilled Clinical Observations: Tension in muscles of Left arm - but no volitional movement, poor postural control - pushes backward and left  Trunk/Postural Assessment  Cervical Assessment Cervical Assessment: Exceptions to [facility] (forward head - eyes often closed) Thoracic Assessment Thoracic Assessment: Exceptions to [facility] (flexed trunk, shortening left) Lumbar Assessment Lumbar Assessment: Exceptions to [facility] (strong posterior preference) Postural Control Postural Control: Deficits on evaluation Head Control: forward head - limited ability to sustain upright head Trunk Control: Pushes back and left Righting Reactions: absemt left, premature right  Balance Balance Balance Assessed: Yes Static Sitting Balance Static Sitting - Balance Support: Left upper extremity supported;Right upper extremity supported;Feet supported Static Sitting - Level of  Assistance: 2: Max assist Dynamic Sitting Balance Dynamic Sitting - Balance Support: Right upper extremity supported;Left upper extremity supported;Feet supported;During functional activity Dynamic Sitting - Level of Assistance: 2: Max assist Static Standing Balance Static Standing - Balance Support: Right upper extremity supported;During functional activity;Left upper extremity supported Static Standing - Level of Assistance: 2: Max assist;1: +1 Total assist Extremity/Trunk Assessment RUE Assessment RUE Assessment: Exceptions to [facility] LUE Assessment LUE Assessment: Exceptions to [facility] Active Range of Motion (AROM) Comments: No volitional movement noted - dense left hemiplegia LUE Body System: Neuro Brunstrum levels for arm and hand: Arm;Hand Brunstrum level for arm: Stage I Presynergy Brunstrum level for hand: Stage II Synergy is developing  Care Tool Care Tool Self Care Eating   Eating Assist Level: Dependent - Patient 0%    Oral Care    Oral Care Assist Level: Dependent - Patient 0%)    Bathing   Body parts bathed by patient: Face Body parts  bathed by helper: Right arm;Left arm;Chest;Abdomen;Front perineal area;Buttocks;Right upper leg;Left upper leg;Right lower leg;Left lower leg   Assist Level: Total Assistance - Patient < 25%    Upper Body Dressing(including orthotics)   What is the patient wearing?: Pull over shirt   Assist Level: Maximal Assistance - Patient 25 - 49%    Lower Body Dressing (excluding footwear)   What is the patient wearing?: Pants;Incontinence brief Assist for lower body dressing: Total Assistance - Patient < 25%    Putting on/Taking off footwear   What is the patient wearing?: Socks Assist for footwear: Dependent - Patient 0%       Care Tool Toileting Toileting activity   Assist for toileting: 2 Helpers     Care Tool Bed Mobility Roll left and right activity   Roll left and right assist level: Maximal Assistance - Patient 25 - 49%    Sit to  lying activity   Sit to lying assist level: Total Assistance - Patient < 25%    Lying to sitting on side of bed activity   Lying to sitting on side of bed assist level: the ability to move from lying on the back to sitting on the side of the bed with no back support.: Total Assistance - Patient < 25%     Care Tool Transfers Sit to stand transfer   Sit to stand assist level: Maximal Assistance - Patient 25 - 49%    Chair/bed transfer   Chair/bed transfer assist level: Total Assistance - Patient < 25%     Toilet transfer Toilet transfer activity did not occur: Safety/medical concerns       Care Tool Cognition  Expression of Ideas and Wants Expression of Ideas and Wants: 3. Some difficulty - exhibits some difficulty with expressing needs and ideas (e.g, some words or finishing thoughts) or speech is not clear  Understanding Verbal and Non-Verbal Content Understanding Verbal and Non-Verbal Content: 3. Usually understands - understands most conversations, but misses some part/intent of message. Requires cues at times to understand   Memory/Recall Ability Memory/Recall Ability : That he or she is in a hospital/hospital unit   Refer to Care Plan for Long Term Goals  SHORT TERM GOAL WEEK 1  Week 1:  OT Short Term Goal 1 (Week 1): Patient will sit with min assist x 5 seconds as needed prior to transfer OT Short Term Goal 2 (Week 1): Patient will actively lean forward in midline while sitting to prepare for sit to stand OT Short Term Goal 3 (Week 1): Patient will visually locate item 15* left of midline with mod cueing OT Short Term Goal 4 (Week 1): Patient will locate left arm on body in functional context - during bathing and dressing  Recommendations for other services: None    Skilled Therapeutic Intervention: Patient received supine in bed - nursing providing care.  Patient reporting back pain 7/10.  Patient assisted to edge of bed and up to wheelchair.  Patient with poor sitting  balance - strong push backward and to weaker left side.  Patient needs max assist for sit to stand transition and to get to wheelchair.  Worked to position hips level in wheelchair as patient falling/ pushing toward left.  Patient unable to flex forward while seated, and having difficulty maintaining eyes open.  Patient reports back pain improved a lot by sitting up in chair.  Spoke to PT about getting chair with more back and head support.   Safety belt in place and engaged  and call bell in lap.  Half lap tray in place to support LUE.   ADL ADL Eating: NPO Grooming: Maximal assistance Where Assessed-Grooming: Sitting at sink Upper Body Bathing: Maximal assistance Where Assessed-Upper Body Bathing: Sitting at sink Lower Body Bathing: Dependent Where Assessed-Lower Body Bathing: Bed level Upper Body Dressing: Maximal assistance Where Assessed-Upper Body Dressing: Sitting at sink Lower Body Dressing: Dependent Where Assessed-Lower Body Dressing: Sitting at sink;Standing at sink Toileting: Dependent Where Assessed-Toileting: Bed level Toilet Transfer: Unable to assess Toilet Transfer Method: Unable to assess Tub/Shower Transfer: Unable to assess Tub/Shower Transfer Method: Unable to assess Film/video editor: Unable to assess Film/video editor Method: Unable to assess ADL Comments: had assistance for IADL prior to hospitalization Mobility  Bed Mobility Bed Mobility: Rolling Left;Left Sidelying to Sit Rolling Left: Moderate Assistance - Patient 50-74% Left Sidelying to Sit: Dependent - Patient equal 0% Transfers Sit to Stand: Maximal Assistance - Patient 25-49% Stand to Sit: Maximal Assistance - Patient 25-49%   Discharge Criteria: Patient will be discharged from OT if patient refuses treatment 3 consecutive times without medical reason, if treatment goals not met, if there is a change in medical status, if patient makes no progress towards goals or if patient is discharged  from hospital.  The above assessment, treatment plan, treatment alternatives and goals were discussed and mutually agreed upon: by patient  Fransico Allean HERO 07/09/2024, 10:27 AM

## 2024-07-09 NOTE — Progress Notes (Addendum)
 PROGRESS NOTE   Subjective/Complaints: No new complaints this morning Feels fatigue Back pain is better, lidocaine  patch is in place  ROS: back pain improved   Objective:   DG Swallowing Func-Speech Pathology Result Date: 07/08/2024 Table formatting from the original result was not included. Modified Barium Swallow Study Patient Details Name: CESIAH WESTLEY MRN: [id number] Date of Birth: Nov 16, 1932 Today's Date: 07/08/2024 HPI/PMH: HPI: ZNIYAH MIDKIFF is an 89 yo female presenting to ED 7/5 for RUQ pain, nausea/vomiting. Found to have suspected cholecystitis vs passed choledocholithiasis and acute liver injury, hyponatremia, bacteriuria, and hematuria. Initially seen at [facility] ED 7/3 with n/v and elevated LFTs but left without being seen. Daughter noted sudden onset AMS and L sided weakness 7/9 with CTA showing abrupt occlusion of distal R MCA M1 s/p TNK. Taken to IR with unsuccessful revascularization (repeated reocclusion), post-procedure CTH showed small SAH. Remained intubated after the procedure, 7/9-7/10. PMH includes HLD, PAD, COPD with emphysema, OSA, R breast cancer, back pain, esophageal spasm Clinical Impression: Clinical Impression: Pt exhibits severe oropharyngeal dysphagia, which is considered slightly worse compared to previous MBS 7/11 but is suspected to primarily be impacted by the amount of trials observed today. She was alert and participatory but delays related to initiation continue to result in gross aspiration of thin, nectar, and honey thick liquids most notably after the swallow secondary to reduced control of oral residuals as they progress through the pharynx. While she senses all aspiration and attempts to cough forcefully, it is ineffective at clearing the entire volume of the aspirated bolus (PAS 7). She was able to follow commands to attempt compensatory strategies but a chin tuck posture was ineffective with both thin  and nectar thick liquids. Biofeedback was beneficial in cueing her to use a more timely subswallow but this cannot consistently be utilized clinically. There was less residue with purees and no penetration/aspiration. Recommend she remain NPO except ice chips after oral care. Plan to f/u to provide education to pt and her daughters. Will continue following to target ESMT and use of prompt subswallows with trials of puree in an effort to make this an effective way to prevent aspiration with liquids. Factors that may increase risk of adverse event in presence of aspiration [name] & [name] 2021): Factors that may increase risk of adverse event in presence of aspiration [name] & [name] 2021): Reduced cognitive function; Limited mobility; Presence of tubes (ETT, trach, NG, etc.); Frequent aspiration of large volumes Recommendations/Plan: Swallowing Evaluation Recommendations Swallowing Evaluation Recommendations Recommendations: NPO; Ice chips PRN after oral care Medication Administration: Via alternative means Oral care recommendations: Oral care QID (4x/day); Oral care before ice chips/water  Recommended consults: Consider Palliative care Treatment Plan Treatment Plan Treatment recommendations: Therapy as outlined in treatment plan below Follow-up recommendations: Acute inpatient rehab (3 hours/day) Functional status assessment: Patient has had a recent decline in their functional status and demonstrates the ability to make significant improvements in function in a reasonable and predictable amount of time. Treatment frequency: Min 2x/week Treatment duration: 2 weeks Interventions: Aspiration precaution training; Oropharyngeal exercises; Compensatory techniques; Patient/family education; Trials of upgraded texture/liquids Recommendations Recommendations for follow up therapy are one component of a multi-disciplinary discharge  planning process, led by the attending physician.  Recommendations may be updated based on  patient status, additional functional criteria and insurance authorization. Assessment: Orofacial Exam: Orofacial Exam Oral Cavity: Oral Hygiene: WFL Oral Cavity - Dentition: Dentures, not available; Poor condition; Missing dentition Orofacial Anatomy: WFL Oral Motor/Sensory Function: Suspected cranial nerve impairment CN V - Trigeminal: WFL CN VII - Facial: Left motor impairment CN IX - Glossopharyngeal, CN X - Vagus: Left motor impairment CN XII - Hypoglossal: Left motor impairment Anatomy: Anatomy: Suspected cervical osteophytes; Prominent cricopharyngeus Boluses Administered: Boluses Administered Boluses Administered: Thin liquids (Level 0); Mildly thick liquids (Level 2, nectar thick); Moderately thick liquids (Level 3, honey thick); Puree  Oral Impairment Domain: Oral Impairment Domain Lip Closure: Escape progressing to mid-chin Tongue control during bolus hold: Posterior escape of less than half of bolus Bolus transport/lingual motion: Slow tongue motion Oral residue: Residue collection on oral structures Location of oral residue : Tongue; Palate Initiation of pharyngeal swallow : Pyriform sinuses  Pharyngeal Impairment Domain: Pharyngeal Impairment Domain Soft palate elevation: No bolus between soft palate (SP)/pharyngeal wall (PW) Laryngeal elevation: Complete superior movement of thyroid  cartilage with complete approximation of arytenoids to epiglottic petiole Anterior hyoid excursion: Complete anterior movement Epiglottic movement: Partial inversion Laryngeal vestibule closure: Complete, no air/contrast in laryngeal vestibule Pharyngeal stripping wave : Present - complete Pharyngeal contraction (A/P view only): N/A Pharyngoesophageal segment opening: Partial distention/partial duration, partial obstruction of flow Tongue base retraction: Narrow column of contrast or air between tongue base and PPW Pharyngeal residue: Collection of residue within or on pharyngeal structures Location of pharyngeal residue:  Tongue base; Valleculae; Pyriform sinuses  Esophageal Impairment Domain: No data recorded Pill: No data recorded Penetration/Aspiration Scale Score: Penetration/Aspiration Scale Score 1.  Material does not enter airway: Puree 7.  Material enters airway, passes BELOW cords and not ejected out despite cough attempt by patient: Thin liquids (Level 0); Mildly thick liquids (Level 2, nectar thick); Moderately thick liquids (Level 3, honey thick) Compensatory Strategies: Compensatory Strategies Compensatory strategies: Yes Chin tuck: Ineffective Ineffective Chin Tuck: Thin liquid (Level 0); Mildly thick liquid (Level 2, nectar thick)   General Information: Caregiver present: No  Diet Prior to this Study: NPO; Cortrak/Small bore NG tube   Temperature : Normal   Respiratory Status: WFL   Supplemental O2: None (Room air)   History of Recent Intubation: Yes  Behavior/Cognition: Alert; Cooperative; Requires cueing Self-Feeding Abilities: Needs assist with self-feeding Baseline vocal quality/speech: Dysphonic Volitional Cough: Able to elicit Volitional Swallow: Able to elicit Exam Limitations: No limitations Goal Planning: Prognosis for improved oropharyngeal function: Fair Barriers to Reach Goals: Cognitive deficits; Severity of deficits No data recorded Patient/Family Stated Goal: continued improvement Consulted and agree with results and recommendations: Patient; Physician Pain: Pain Assessment Pain Assessment: Faces Faces Pain Scale: 4 Pain Location: all over Pain Descriptors / Indicators: Aching; Grimacing; Discomfort Pain Intervention(s): Monitored during session End of Session: Start Time:SLP Start Time (ACUTE ONLY): 0957 Stop Time: SLP Stop Time (ACUTE ONLY): 1021 Time Calculation:SLP Time Calculation (min) (ACUTE ONLY): 24 min Charges: SLP Evaluations $ SLP Speech Visit: 1 Visit SLP Evaluations $MBS Swallow: 1 Procedure $Swallowing Treatment: 1 Procedure $Speech Treatment for Individual: 1 Procedure SLP visit  diagnosis: SLP Visit Diagnosis: Dysphagia, oropharyngeal phase (R13.12) Past Medical History: Past Medical History: Diagnosis Date  Anxiety   Back pain   Cancer (HCC)   Depression   Emphysema   Esophageal spasm   Lung nodule   OSA (obstructive sleep apnea)   Osteomalacia  Personal history of radiation therapy   Vertigo   Vitamin D  deficiency  Past Surgical History: Past Surgical History: Procedure Laterality Date  ABDOMINAL HYSTERECTOMY    APPENDECTOMY    BREAST BIOPSY Right 08/31/2020  BREAST EXCISIONAL BIOPSY Left 1978  BREAST LUMPECTOMY Right 09/30/2020  BREAST LUMPECTOMY WITH RADIOACTIVE SEED LOCALIZATION Right 09/30/2020  Procedure: RIGHT BREAST LUMPECTOMY WITH RADIOACTIVE SEED LOCALIZATION;  Surgeon: Curvin Deward MOULD, MD;  Location: [facility];  Service: General;  Laterality: Right;  CARPAL TUNNEL RELEASE    IR CT HEAD LTD  06/25/2024  IR PERCUTANEOUS ART THROMBECTOMY/INFUSION INTRACRANIAL INC DIAG ANGIO  06/25/2024  RADIOLOGY WITH ANESTHESIA N/A 06/25/2024  Procedure: RADIOLOGY WITH ANESTHESIA;  Surgeon: Dolphus Carrion, MD;  Location: [facility] OR;  Service: Radiology;  Laterality: N/A;  ROTATOR CUFF REPAIR   Damien Blumenthal, M.A., CCC-SLP Speech Language Pathology, Acute Rehabilitation Services Secure Chat preferred [phone] 07/08/2024, 11:13 AM  Recent Labs    07/08/24 1040 07/09/24 0431  WBC 6.9 7.1  HGB 11.2* 11.3*  HCT 34.5* 33.9*  PLT 286 237   Recent Labs    07/08/24 1040 07/09/24 0431  NA 138 137  K 4.6 4.0  CL 99 101  CO2 27 26  GLUCOSE 127* 117*  BUN 22 23  CREATININE 0.71 0.68  CALCIUM  9.4 9.4    Intake/Output Summary (Last 24 hours) at 07/09/2024 1100 Last data filed at 07/09/2024 0646 Gross per 24 hour  Intake 598.67 ml  Output --  Net 598.67 ml        Physical Exam: Vital Signs Blood pressure 137/67, pulse 61, temperature 97.6 F (36.4 C), temperature source Oral, resp. rate 16, height 5' 1 (1.549 m), weight 67.8 kg, SpO2 98%. Gen: no distress, normal  appearing HENT:     Head: Normocephalic and atraumatic.     Comments: Mild L facial droop Tongue midline  Mild to moderate facial redness- on R cheek    Right Ear: External ear normal.     Left Ear: External ear normal.     Nose: Nose normal. No congestion.     Comments: Cortrak- in nare    Mouth/Throat:     Mouth: Mucous membranes are dry.  Eyes:     Comments: Couldn't complete EOM's for me- but so sleepy  Cardiovascular:     Rate and Rhythm: Normal rate and regular rhythm.     Heart sounds: Normal heart sounds. No murmur heard.    No gallop.  Pulmonary:     Effort: Pulmonary effort is normal. No respiratory distress.     Breath sounds: Normal breath sounds. No wheezing, rhonchi or rales.  Abdominal:     General: Bowel sounds are normal. There is no distension.     Palpations: Abdomen is soft.     Tenderness: There is no abdominal tenderness.  Musculoskeletal:     Cervical back: Neck supple.     Comments:  RUE 5-/5 LUE 0/5 throughout RLE- 4+/5 throughout- hard to get pt to do exam LLE- PF 1/5- otherwise 0/5 in LLE  Skin:    General: Skin is warm and dry.     Comments: R forearm IV Some UE bruising  A lot of moles vs neurofibromas? On skin with actinic keratosis on multiple spots  Neurological:     Mental Status: She is alert.     Comments: Moderate dysarthria with occasional wet voice. Left facial weakness with left inattention. Left HH and needs cues to look to left. Able to smaller numbers  on the clock. She was able to answer basic orientation questions, state age, DOB and PCP name.  Left hemiparesis with sensory deficits.  No clonus, no hoffman's Strong (+) Babinski Flaccid on L side  Psychiatric:     Comments: Sleepy- hard to keep her awake for exam    Assessment/Plan: 1. Functional deficits which require 3+ hours per day of interdisciplinary therapy in a comprehensive inpatient rehab setting. Physiatrist is providing close team supervision and 24 hour management  of active medical problems listed below. Physiatrist and rehab team continue to assess barriers to discharge/monitor patient progress toward functional and medical goals  Care Tool:  Bathing    Body parts bathed by patient: Face   Body parts bathed by helper: Right arm, Left arm, Chest, Abdomen, Front perineal area, Buttocks, Right upper leg, Left upper leg, Right lower leg, Left lower leg     Bathing assist Assist Level: Total Assistance - Patient < 25%     Upper Body Dressing/Undressing Upper body dressing   What is the patient wearing?: Pull over shirt    Upper body assist Assist Level: Maximal Assistance - Patient 25 - 49%    Lower Body Dressing/Undressing Lower body dressing      What is the patient wearing?: Pants, Incontinence brief     Lower body assist Assist for lower body dressing: Total Assistance - Patient < 25%     Toileting Toileting    Toileting assist Assist for toileting: 2 Helpers     Transfers Chair/bed transfer  Transfers assist  Chair/bed transfer activity did not occur: Safety/medical concerns (did not get up)  Chair/bed transfer assist level: Total Assistance - Patient < 25%     Locomotion Ambulation   Ambulation assist              Walk 10 feet activity   Assist           Walk 50 feet activity   Assist           Walk 150 feet activity   Assist           Walk 10 feet on uneven surface  activity   Assist           Wheelchair     Assist               Wheelchair 50 feet with 2 turns activity    Assist            Wheelchair 150 feet activity     Assist          Blood pressure 137/67, pulse 61, temperature 97.6 F (36.4 C), temperature source Oral, resp. rate 16, height 5' 1 (1.549 m), weight 67.8 kg, SpO2 98%.  Medical Problem List and Plan: 1. Functional deficits secondary to R MCA stroke             -patient may  shower             -ELOS/Goals: 16-2 days-  min-mod A             Continue CIR  2.  Impaired mobility: -DVT/anticoagulation:  Pharmaceutical: continue Lovenox              -antiplatelet therapy: Continue ASA.  --Plavix recommended if no further procedures planned. Question need for PEG.   3. Pain Management: Continue lidocaine  patch             --sedated from Flexeril  5 mg/tramadol  50 mg this afternoon. Will decrease  doses.   4. Mood/Behavior/Sleep: LCSW to follow for evaluation and support.              --reports of hallucination/encephalopath. Has has a lot of confusion at nights and has been been sleeping a lot during the day per daughter  --has chronic insomnia-->used to just lie in bed and sleeps till 10 am usually.  --Will schedule melatonin at 8 pm tonight. Sleep wake chart to monitor sleep              -antipsychotic agents: N/A  5. Neuropsych/cognition: This patient may be intermittently capable of making decisions on her own behalf.  6. Skin/Wound Care: Routine pressure relief measures.   7. Dysphagia: Continue tube feeds-->does not seem to have water  flushes but renal status WNL.   8.  E coli bacteremia: Treated with 10 day course of antibiotics. Ceftriaxone -->Duricef thru 07/14. Thrombocytopenia/leucocytosis has resolved.              --Hematuria has resolved. No dysuria.   9. Chronic back/Right knee pain: Continue voltaren  gel qid to knee and back till family bring in the biofreeze (which works better)             --Lidocaine  patches at nights to help with back spasms. Decrease flexeril  to 2.5 mg every 8 hours prn             --schedule tylenol  500 mg qid as LFTs resolved.   10. Abnormal LFTs: Have resolved and Lipitor added on 07/19. Continue to monitor weekly for  now.   11. COPD/OSA: Has CPAP but not using PTA. Continue Yupelri  nebs daily (in place of Spiriva).  --Used albuterol  every night PTA.  .  12. Hypomagnesemia: Magnesium  reviewed and has normalized  13. Anemia: Monitor for signs of bleeding. H/H  recovering from 13-->9.9-->11.2  14.  Bradycardia: HR note to be in 51 to 61 range and had boderline prolonged Qtc on admission. Likely due to stroke.  Repeat EKG reviewed and is stable  15.  H/o MDD/anxiety: Continue Lexapro .       LOS: 1 days A FACE TO FACE EVALUATION WAS PERFORMED  Malayasia Mirkin P Kimyetta Flott 07/09/2024, 11:00 AM

## 2024-07-09 NOTE — Plan of Care (Signed)
  Problem: RH Balance Goal: LTG Patient will maintain dynamic sitting balance (PT) Description: LTG:  Patient will maintain dynamic sitting balance with assistance during mobility activities (PT) Flowsheets (Taken 07/09/2024 1555) LTG: Pt will maintain dynamic sitting balance during mobility activities with:: Moderate Assistance - Patient 50 - 74% Goal: LTG Patient will maintain dynamic standing balance (PT) Description: LTG:  Patient will maintain dynamic standing balance with assistance during mobility activities (PT) Flowsheets (Taken 07/09/2024 1555) LTG: Pt will maintain dynamic standing balance during mobility activities with:: Moderate Assistance - Patient 50 - 74%   Problem: Sit to Stand Goal: LTG:  Patient will perform sit to stand with assistance level (PT) Description: LTG:  Patient will perform sit to stand with assistance level (PT) Flowsheets (Taken 07/09/2024 1555) LTG: PT will perform sit to stand in preparation for functional mobility with assistance level: Maximal Assistance - Patient 25 - 49%   Problem: RH Bed Mobility Goal: LTG Patient will perform bed mobility with assist (PT) Description: LTG: Patient will perform bed mobility with assistance, with/without cues (PT). Flowsheets (Taken 07/09/2024 1555) LTG: Pt will perform bed mobility with assistance level of: Moderate Assistance - Patient 50 - 74%   Problem: RH Bed to Chair Transfers Goal: LTG Patient will perform bed/chair transfers w/assist (PT) Description: LTG: Patient will perform bed to chair transfers with assistance (PT). Flowsheets (Taken 07/09/2024 1555) LTG: Pt will perform Bed to Chair Transfers with assistance level: Moderate Assistance - Patient 50 - 74%   Problem: RH Car Transfers Goal: LTG Patient will perform car transfers with assist (PT) Description: LTG: Patient will perform car transfers with assistance (PT). Flowsheets (Taken 07/09/2024 1555) LTG: Pt will perform car transfers with assist::  Maximal Assistance - Patient 25 - 49%   Problem: RH Ambulation Goal: LTG Patient will ambulate in controlled environment (PT) Description: LTG: Patient will ambulate in a controlled environment, # of feet with assistance (PT). Flowsheets (Taken 07/09/2024 1555) LTG: Pt will ambulate in controlled environ  assist needed:: Maximal Assistance - Patient 25 - 49% Goal: LTG Patient will ambulate in home environment (PT) Description: LTG: Patient will ambulate in home environment, # of feet with assistance (PT). Flowsheets (Taken 07/09/2024 1555) LTG: Pt will ambulate in home environ  assist needed:: Maximal Assistance - Patient 25 - 49%   Problem: RH Wheelchair Mobility Goal: LTG Patient will propel w/c in controlled environment (PT) Description: LTG: Patient will propel wheelchair in controlled environment, # of feet with assist (PT) Flowsheets (Taken 07/09/2024 1555) LTG: Pt will propel w/c in controlled environ  assist needed:: Minimal Assistance - Patient > 75% Goal: LTG Patient will propel w/c in home environment (PT) Description: LTG: Patient will propel wheelchair in home environment, # of feet with assistance (PT). Flowsheets (Taken 07/09/2024 1555) LTG: Pt will propel w/c in home environ  assist needed:: Minimal Assistance - Patient > 75% Goal: LTG Patient will propel w/c in community environment (PT) Description: LTG: Patient will propel wheelchair in community environment, # of feet with assist (PT) Flowsheets (Taken 07/09/2024 1555) LTG: Pt will propel w/c in community environ  assist needed:: Minimal Assistance - Patient > 75%   Problem: RH Stairs Goal: LTG Patient will ambulate up and down stairs w/assist (PT) Description: LTG: Patient will ambulate up and down # of stairs with assistance (PT) Flowsheets (Taken 07/09/2024 1555) LTG: Pt will ambulate up/down stairs assist needed:: Maximal Assistance - Patient 25 - 49%

## 2024-07-09 NOTE — Plan of Care (Signed)
 Problem: RH Balance Goal: LTG: Patient will maintain dynamic sitting balance (OT) Description: LTG:  Patient will maintain dynamic sitting balance with assistance during activities of daily living (OT) Flowsheets (Taken 07/09/2024 1234) LTG: Pt will maintain dynamic sitting balance during ADLs with: Minimal Assistance - Patient > 75% Goal: LTG Patient will maintain dynamic standing with ADLs (OT) Description: LTG:  Patient will maintain dynamic standing balance with assist during activities of daily living (OT)  Flowsheets (Taken 07/09/2024 1234) LTG: Pt will maintain dynamic standing balance during ADLs with: Moderate Assistance - Patient 50 - 74%   Problem: Sit to Stand Goal: LTG:  Patient will perform sit to stand in prep for activites of daily living with assistance level (OT) Description: LTG:  Patient will perform sit to stand in prep for activites of daily living with assistance level (OT) Flowsheets (Taken 07/09/2024 1234) LTG: PT will perform sit to stand in prep for activites of daily living with assistance level: Minimal Assistance - Patient > 75%   Problem: RH Grooming Goal: LTG Patient will perform grooming w/assist,cues/equip (OT) Description: LTG: Patient will perform grooming with assist, with/without cues using equipment (OT) Flowsheets (Taken 07/09/2024 1234) LTG: Pt will perform grooming with assistance level of: Minimal Assistance - Patient > 75%   Problem: RH Bathing Goal: LTG Patient will bathe all body parts with assist levels (OT) Description: LTG: Patient will bathe all body parts with assist levels (OT) Flowsheets (Taken 07/09/2024 1234) LTG: Pt will perform bathing with assistance level/cueing: Moderate Assistance - Patient 50 - 74%   Problem: RH Dressing Goal: LTG Patient will perform upper body dressing (OT) Description: LTG Patient will perform upper body dressing with assist, with/without cues (OT). Flowsheets (Taken 07/09/2024 1234) LTG: Pt will perform  upper body dressing with assistance level of: Minimal Assistance - Patient > 75% Goal: LTG Patient will perform lower body dressing w/assist (OT) Description: LTG: Patient will perform lower body dressing with assist, with/without cues in positioning using equipment (OT) Flowsheets (Taken 07/09/2024 1234) LTG: Pt will perform lower body dressing with assistance level of: Moderate Assistance - Patient 50 - 74%   Problem: RH Toileting Goal: LTG Patient will perform toileting task (3/3 steps) with assistance level (OT) Description: LTG: Patient will perform toileting task (3/3 steps) with assistance level (OT)  Flowsheets (Taken 07/09/2024 1234) LTG: Pt will perform toileting task (3/3 steps) with assistance level: Moderate Assistance - Patient 50 - 74%   Problem: RH Vision Goal: RH LTG Vision Consulting civil engineer) Flowsheets (Taken 07/09/2024 1234) LTG: Vision Goals: Patient will visually scan left of midline to locate needed BADL items with min cueing   Problem: RH Toileting Goal: LTG Patient will perform toileting task (3/3 steps) with assistance level (OT) Description: LTG: Patient will perform toileting task (3/3 steps) with assistance level (OT)  Flowsheets (Taken 07/09/2024 1234) LTG: Pt will perform toileting task (3/3 steps) with assistance level: Moderate Assistance - Patient 50 - 74%   Problem: RH Functional Use of Upper Extremity Goal: LTG Patient will use RT/LT upper extremity as a (OT) Description: LTG: Patient will use right/left upper extremity as a stabilizer/gross assist/diminished/nondominant/dominant level with assist, with/without cues during functional activity (OT) Flowsheets (Taken 07/09/2024 1234) LTG: Use of upper extremity in functional activities: LUE as a stabilizer LTG: Pt will use upper extremity in functional activity with assistance level of: Minimal Assistance - Patient > 75%   Problem: RH Toilet Transfers Goal: LTG Patient will perform toilet transfers w/assist  (OT) Description: LTG: Patient will perform toilet  transfers with assist, with/without cues using equipment (OT) Flowsheets (Taken 07/09/2024 1234) LTG: Pt will perform toilet transfers with assistance level of: Moderate Assistance - Patient 50 - 74%   Problem: RH Tub/Shower Transfers Goal: LTG Patient will perform tub/shower transfers w/assist (OT) Description: LTG: Patient will perform tub/shower transfers with assist, with/without cues using equipment (OT) Flowsheets (Taken 07/09/2024 1234) LTG: Pt will perform tub/shower stall transfers with assistance level of: Moderate Assistance - Patient 50 - 74% LTG: Pt will perform tub/shower transfers from: Walk in shower   Problem: RH Memory Goal: LTG Patient will demonstrate ability for day to day recall/carry over during activities of daily living with assistance level (OT) Description: LTG:  Patient will demonstrate ability for day to day recall/carry over during activities of daily living with assistance level (OT). Flowsheets (Taken 07/09/2024 1234) LTG:  Patient will demonstrate ability for day to day recall/carry over during activities of daily living with assistance level (OT): Minimal Assistance - Patient > 75%   Problem: RH Attention Goal: LTG Patient will demonstrate this level of attention during functional activites (OT) Description: LTG:  Patient will demonstrate this level of attention during functional activites  (OT) Flowsheets (Taken 07/09/2024 1234) Patient will demonstrate this level of attention during functional activites: Selective LTG: Patient will demonstrate this level of attention during functional activites (OT): Minimal Assistance - Patient > 75%   Problem: RH Awareness Goal: LTG: Patient will demonstrate awareness during functional activites type of (OT) Description: LTG: Patient will demonstrate awareness during functional activites type of (OT) Flowsheets (Taken 07/09/2024 1234) Patient will demonstrate awareness  during functional activites type of: Anticipatory LTG: Patient will demonstrate awareness during functional activites type of (OT): Minimal Assistance - Patient > 75%

## 2024-07-09 NOTE — Plan of Care (Signed)
  Problem: Consults Goal: RH STROKE PATIENT EDUCATION Description: See Patient Education module for education specifics  Outcome: Progressing Goal: Nutrition Consult-if indicated Outcome: Progressing   Problem: RH BOWEL ELIMINATION Goal: RH STG MANAGE BOWEL WITH ASSISTANCE Description: STG Manage Bowel with min Assistance. Outcome: Progressing   Problem: RH BLADDER ELIMINATION Goal: RH STG MANAGE BLADDER WITH ASSISTANCE Description: STG Manage Bladder With min Assistance Outcome: Progressing   Problem: RH KNOWLEDGE DEFICIT Goal: RH STG INCREASE KNOWLEDGE OF DYSPHAGIA/FLUID INTAKE Description: Patient and family will be able to demonstrate understanding of dietary modifications to prevent complications related to stroke independently using handout provided.  Outcome: Progressing Goal: RH STG INCREASE KNOWLEGDE OF HYPERLIPIDEMIA Description: Patient and family will be able to demonstrate understanding of medication management and dietary modifications to better control cholesterol levels to prevent further occurrence of stroke independently using handout provided.  Outcome: Progressing Goal: RH STG INCREASE KNOWLEDGE OF STROKE PROPHYLAXIS Description: Patient and family will be able to demonstrate understanding of medication management and dietary modifications to better control cholesterol levels to prevent further occurrence of stroke independently using handout provided.  Outcome: Progressing   Problem: Education: Goal: Ability to describe self-care measures that may prevent or decrease complications (Diabetes Survival Skills Education) will improve Outcome: Progressing Goal: Individualized Educational Video(s) Outcome: Progressing   Problem: Coping: Goal: Ability to adjust to condition or change in health will improve Outcome: Progressing   Problem: Fluid Volume: Goal: Ability to maintain a balanced intake and output will improve Outcome: Progressing   Problem: Health  Behavior/Discharge Planning: Goal: Ability to identify and utilize available resources and services will improve Outcome: Progressing Goal: Ability to manage health-related needs will improve Outcome: Progressing   Problem: Metabolic: Goal: Ability to maintain appropriate glucose levels will improve Outcome: Progressing   Problem: Nutritional: Goal: Maintenance of adequate nutrition will improve Outcome: Progressing Goal: Progress toward achieving an optimal weight will improve Outcome: Progressing   Problem: Skin Integrity: Goal: Risk for impaired skin integrity will decrease Outcome: Progressing   Problem: Tissue Perfusion: Goal: Adequacy of tissue perfusion will improve Outcome: Progressing

## 2024-07-10 DIAGNOSIS — I63511 Cerebral infarction due to unspecified occlusion or stenosis of right middle cerebral artery: Secondary | ICD-10-CM | POA: Diagnosis not present

## 2024-07-10 LAB — BASIC METABOLIC PANEL WITH GFR
Anion gap: 11 (ref 5–15)
BUN: 25 mg/dL — ABNORMAL HIGH (ref 8–23)
CO2: 26 mmol/L (ref 22–32)
Calcium: 9.4 mg/dL (ref 8.9–10.3)
Chloride: 98 mmol/L (ref 98–111)
Creatinine, Ser: 0.68 mg/dL (ref 0.44–1.00)
GFR, Estimated: >60 mL/min (ref >60)
Glucose, Bld: 121 mg/dL — ABNORMAL HIGH (ref 70–99)
Potassium: 4 mmol/L (ref 3.5–5.1)
Sodium: 135 mmol/L (ref 135–145)

## 2024-07-10 LAB — CBC
HCT: 33.3 % — ABNORMAL LOW (ref 36.0–46.0)
Hemoglobin: 11.4 g/dL — ABNORMAL LOW (ref 12.0–15.0)
MCH: 32 pg (ref 26.0–34.0)
MCHC: 34.2 g/dL (ref 30.0–36.0)
MCV: 93.5 fL (ref 80.0–100.0)
Platelets: 229 K/uL (ref 150–400)
RBC: 3.56 MIL/uL — ABNORMAL LOW (ref 3.87–5.11)
RDW: 14.3 % (ref 11.5–15.5)
WBC: 8.4 K/uL (ref 4.0–10.5)
nRBC: 0 % (ref 0.0–0.2)

## 2024-07-10 MED ORDER — TRAMADOL HCL 50 MG PO TABS
50.0000 mg | ORAL_TABLET | Freq: Four times a day (QID) | ORAL | Status: DC | PRN
  Administered 2024-07-10: 50 mg via ORAL
  Filled 2024-07-10: qty 1

## 2024-07-10 MED ORDER — BACLOFEN 5 MG HALF TABLET: 5.0000 mg | ORAL_TABLET | Freq: Every day | ORAL | Status: DC

## 2024-07-10 MED ORDER — BACLOFEN 5 MG HALF TABLET
5.0000 mg | ORAL_TABLET | Freq: Every day | ORAL | Status: DC
  Administered 2024-07-10 – 2024-07-23 (×14): 5 mg
  Filled 2024-07-10 (×16): qty 1

## 2024-07-10 MED ORDER — CYCLOBENZAPRINE HCL 5 MG PO TABS: 2.5000 mg | ORAL_TABLET | Freq: Three times a day (TID) | ORAL | Status: DC | PRN

## 2024-07-10 MED ORDER — MODAFINIL 100 MG PO TABS
100.0000 mg | ORAL_TABLET | Freq: Every day | ORAL | Status: DC
  Administered 2024-07-10: 100 mg via ORAL
  Filled 2024-07-10: qty 1

## 2024-07-10 MED ORDER — CYCLOBENZAPRINE HCL 5 MG PO TABS
2.5000 mg | ORAL_TABLET | Freq: Three times a day (TID) | ORAL | Status: DC | PRN
  Administered 2024-07-10 – 2024-07-14 (×4): 2.5 mg
  Filled 2024-07-10 (×4): qty 1

## 2024-07-10 MED ORDER — TAPENTADOL HCL ER 50 MG PO TB12: 50.0000 mg | ORAL_TABLET | Freq: Two times a day (BID) | ORAL | Status: DC

## 2024-07-10 MED ORDER — CAMPHOR-MENTHOL 0.5-0.5 % EX LOTN: TOPICAL_LOTION | CUTANEOUS | Status: DC | PRN

## 2024-07-10 MED ORDER — TRAMADOL HCL 50 MG PO TABS
25.0000 mg | ORAL_TABLET | Freq: Two times a day (BID) | ORAL | Status: DC
  Administered 2024-07-10 – 2024-07-24 (×23): 25 mg
  Filled 2024-07-10 (×25): qty 1

## 2024-07-10 MED ORDER — TRAMADOL HCL 50 MG PO TABS: 50.0000 mg | ORAL_TABLET | Freq: Four times a day (QID) | ORAL | Status: DC | PRN

## 2024-07-10 MED ORDER — MODAFINIL 100 MG PO TABS
100.0000 mg | ORAL_TABLET | Freq: Every day | ORAL | Status: DC
  Administered 2024-07-11 – 2024-07-24 (×13): 100 mg
  Filled 2024-07-10 (×13): qty 1

## 2024-07-10 NOTE — Progress Notes (Signed)
 I went to give patient her 6pm medications and patient expressed her back was feeling a lot better and her pain level is down from a 10 to a 5 on a scale of 1-10. Patient would like to have the scheduled Baclofen  as prescribed tonight.   [name] KATHEE Molt

## 2024-07-10 NOTE — Progress Notes (Signed)
 Met with patient's daughter, Lamarr to discuss care coordinator role, roles of other disciplines, rehab process, and current medications. Daughter appreciative of care. Daughter states they will be alternating care at discharge. No further questions at this time.

## 2024-07-10 NOTE — Progress Notes (Signed)
 Speech Language Pathology Daily Session Note  Patient Details  Name: [name] MRN: [id number] Date of Birth: 1932/08/12  Today's Date: 07/10/2024 SLP Individual Time: 0900-0950 SLP Individual Time Calculation (min): 50 min  Short Term Goals: Week 1: SLP Short Term Goal 1 (Week 1): Pt will trial ice chips/ water  with SLP only in order to assess potential for initiation of [place] water  protocol SLP Short Term Goal 2 (Week 1): Patient will participate in formal cognitive testing in order to determine current cognitive function. SLP Short Term Goal 3 (Week 1): Patient will recall daily events with 80% accuracy using internal/external memory aids given min assist. SLP Short Term Goal 4 (Week 1): Patient will complete pharyngeal strengthening exercises as appropriate given mod A in order to improve swallow function  Skilled Therapeutic Interventions: Skilled therapy session focused on cognitive and dysphagia goals. SLP facilitated session by prompting patient to recall biographical information including hometown and family. Patient did so accurately and independently. SLP targeted dysphagia goals through prompting completion of expiratory muscle strength training x15 at 10cm H2O. Patient required modA to complete and continuous encouragement due to lethargy. Patient completed x10 chin tucks against resistance with mod-max visual and verbal A. SLP provided set up A for oral care and offered ice chips. Patient with s/sx of aspiration in 50% of trials. Patient extremely lethargic this date requiring verbal and tactile cues to remain awake, therefore PO trials were d/c. Patient missed 10 minutes of ST due to lethargy. Patient left in [facility] with alarm set and call bell in reach. Continue POC  Pain None reported   Therapy/Group: Individual Therapy  Cortney Beissel M.A., CCC-SLP 07/10/2024, 7:41 AM

## 2024-07-10 NOTE — Progress Notes (Signed)
 Patient ID: [name], female   DOB: [birth year], 89 y.o.   MRN: [id number] Met with the patient and dtr Lamarr to review team conference updates. Discussed current medical situation, dysphagia on cortrak with MBS follow up per SLP. PEG placement possibility reviewed. Discussed recommendation for 24/7 care and long range goal of mod assist for discharge in 3-4 weeks. Reviewed weekly team conference to provide updates of functional progress. Patient currently limited by lack of sensation on left with tone, perceptual deficits, pushing on left side, and pain and insomnia (hallucinations). Currently needs max assist + 2 for transfers and total assist for self care. Continue to follow along to address educational needs and identified barriers in preparation for discharge home with daughters and caregiver, Doyal. [name]

## 2024-07-10 NOTE — Progress Notes (Signed)
 Physical Therapy Session Note  Patient Details  Name: [name] MRN: [id number] Date of Birth: Nov 21, 1932  Today's Date: 07/10/2024 PT Individual Time: 9196-9154 PT Individual Time Calculation (min): 42 min   Short Term Goals: Week 1:  PT Short Term Goal 1 (Week 1): Pt will iniate sitting balance with training with L sided wedge PT Short Term Goal 2 (Week 1): Pt will attempt sit to stand training PT Short Term Goal 3 (Week 1): Pt will attempt slide board transfer training to explore transfer options PT Short Term Goal 4 (Week 1): Pt will continue bed mobility training focusing on reducing caregiver burden  Skilled Therapeutic Interventions/Progress Updates:      Retrieved Liberty wheelchair to promote postural control, sitting tolerance, back support, and tilting for pressure relief and safety. Feedback from nursing yesterday that patient almost slid out of her manual wheelchair.   Pt resting in bed to start - RN providing morning medications through [facility]. [name], Dorothyann, at the bedside. Pt reports she typically doesn't start her days until 10am at home, has difficulty with her new routine in rehab. She also reports having a rough night due to muscle spasms - MD made aware at end of treatment.   Donned pants at bed level, patient requiring dependent assist for rolling to her R and maxA for rolling to her L, using hospital bed features.   Supine<>sitting EOB at maxA +2 level with assist for trunk and BLE management. Patient pushing and leaning to her weaker L side without awareness, needing maxA to achieve upright and totalA for forward scooting to EOB. Pt completed squat pivot transfer towards her stronger R side with totalA (Pt <25%) which is improvement from yesterday where she was +2 dependent.   Transported patient in wheelchair to main gym and provided adjustments to wheelchair headrest to promote L head turn and to accommodate for her forward head posture.    Patient then instructed in hanging resistive clothes pins along the bar which was placed on her L side to promote sustained visual scanning L and sustained head turn L. Patient needing max cues and hand-over-hand assist for motor planning and for placing item on her L side.   Patient visibly fatigued at end of treatment, keeping eyes closed and resting in wheelchair. Provided warm blanket for comfort and used pillow to support her flaccid UE. Patient returned to her room and left tilted in wheelchair with seat belt alarm on and her needs met. MD entering room at end.    Therapy Documentation Precautions:  Precautions Precautions: Fall Recall of Precautions/Restrictions: Impaired Precaution/Restrictions Comments: Cortrak- NPO Restrictions Weight Bearing Restrictions Per Provider Order: No General:     Therapy/Group: Individual Therapy  [name] 07/10/2024, 7:46 AM

## 2024-07-10 NOTE — Progress Notes (Signed)
 PROGRESS NOTE   Subjective/Complaints: Feeling better today Christian notes back pain and muscle spasms are still an issue for her, ordered Nucynta   ROS: back pain improved, +muscle spasms   Objective:   DG Swallowing Func-Speech Pathology Result Date: 07/08/2024 Table formatting from the original result was not included. Modified Barium Swallow Study Patient Details Name: [name] MRN: [id number] Date of Birth: 1932-07-22 Today's Date: 07/08/2024 HPI/PMH: HPI: [name] is an 89 yo female presenting to ED 7/5 for RUQ pain, nausea/vomiting. Found to have suspected cholecystitis vs passed choledocholithiasis and acute liver injury, hyponatremia, bacteriuria, and hematuria. Initially seen at [facility] ED 7/3 with n/v and elevated LFTs but left without being seen. Daughter noted sudden onset AMS and L sided weakness 7/9 with CTA showing abrupt occlusion of distal R MCA M1 s/p TNK. Taken to IR with unsuccessful revascularization (repeated reocclusion), post-procedure CTH showed small SAH. Remained intubated after the procedure, 7/9-7/10. PMH includes HLD, PAD, COPD with emphysema, OSA, R breast cancer, back pain, esophageal spasm Clinical Impression: Clinical Impression: Pt exhibits severe oropharyngeal dysphagia, which is considered slightly worse compared to previous MBS 7/11 but is suspected to primarily be impacted by the amount of trials observed today. She was alert and participatory but delays related to initiation continue to result in gross aspiration of thin, nectar, and honey thick liquids most notably after the swallow secondary to reduced control of oral residuals as they progress through the pharynx. While she senses all aspiration and attempts to cough forcefully, it is ineffective at clearing the entire volume of the aspirated bolus (PAS 7). She was able to follow commands to attempt compensatory strategies but a chin tuck posture  was ineffective with both thin and nectar thick liquids. Biofeedback was beneficial in cueing her to use a more timely subswallow but this cannot consistently be utilized clinically. There was less residue with purees and no penetration/aspiration. Recommend she remain NPO except ice chips after oral care. Plan to f/u to provide education to pt and her daughters. Will continue following to target ESMT and use of prompt subswallows with trials of puree in an effort to make this an effective way to prevent aspiration with liquids. Factors that may increase risk of adverse event in presence of aspiration [name] & [name] 2021): Factors that may increase risk of adverse event in presence of aspiration [name] & [name] 2021): Reduced cognitive function; Limited mobility; Presence of tubes (ETT, trach, NG, etc.); Frequent aspiration of large volumes Recommendations/Plan: Swallowing Evaluation Recommendations Swallowing Evaluation Recommendations Recommendations: NPO; Ice chips PRN after oral care Medication Administration: Via alternative means Oral care recommendations: Oral care QID (4x/day); Oral care before ice chips/water  Recommended consults: Consider Palliative care Treatment Plan Treatment Plan Treatment recommendations: Therapy as outlined in treatment plan below Follow-up recommendations: Acute inpatient rehab (3 hours/day) Functional status assessment: Patient has had a recent decline in their functional status and demonstrates the ability to make significant improvements in function in a reasonable and predictable amount of time. Treatment frequency: Min 2x/week Treatment duration: 2 weeks Interventions: Aspiration precaution training; Oropharyngeal exercises; Compensatory techniques; Patient/family education; Trials of upgraded texture/liquids Recommendations Recommendations for follow up therapy are one component  of a multi-disciplinary discharge planning process, led by the attending physician.   Recommendations may be updated based on patient status, additional functional criteria and insurance authorization. Assessment: Orofacial Exam: Orofacial Exam Oral Cavity: Oral Hygiene: WFL Oral Cavity - Dentition: Dentures, not available; Poor condition; Missing dentition Orofacial Anatomy: WFL Oral Motor/Sensory Function: Suspected cranial nerve impairment CN V - Trigeminal: WFL CN VII - Facial: Left motor impairment CN IX - Glossopharyngeal, CN X - Vagus: Left motor impairment CN XII - Hypoglossal: Left motor impairment Anatomy: Anatomy: Suspected cervical osteophytes; Prominent cricopharyngeus Boluses Administered: Boluses Administered Boluses Administered: Thin liquids (Level 0); Mildly thick liquids (Level 2, nectar thick); Moderately thick liquids (Level 3, honey thick); Puree  Oral Impairment Domain: Oral Impairment Domain Lip Closure: Escape progressing to mid-chin Tongue control during bolus hold: Posterior escape of less than half of bolus Bolus transport/lingual motion: Slow tongue motion Oral residue: Residue collection on oral structures Location of oral residue : Tongue; Palate Initiation of pharyngeal swallow : Pyriform sinuses  Pharyngeal Impairment Domain: Pharyngeal Impairment Domain Soft palate elevation: No bolus between soft palate (SP)/pharyngeal wall (PW) Laryngeal elevation: Complete superior movement of thyroid  cartilage with complete approximation of arytenoids to epiglottic petiole Anterior hyoid excursion: Complete anterior movement Epiglottic movement: Partial inversion Laryngeal vestibule closure: Complete, no air/contrast in laryngeal vestibule Pharyngeal stripping wave : Present - complete Pharyngeal contraction (A/P view only): N/A Pharyngoesophageal segment opening: Partial distention/partial duration, partial obstruction of flow Tongue base retraction: Narrow column of contrast or air between tongue base and PPW Pharyngeal residue: Collection of residue within or on pharyngeal  structures Location of pharyngeal residue: Tongue base; Valleculae; Pyriform sinuses  Esophageal Impairment Domain: No data recorded Pill: No data recorded Penetration/Aspiration Scale Score: Penetration/Aspiration Scale Score 1.  Material does not enter airway: Puree 7.  Material enters airway, passes BELOW cords and not ejected out despite cough attempt by patient: Thin liquids (Level 0); Mildly thick liquids (Level 2, nectar thick); Moderately thick liquids (Level 3, honey thick) Compensatory Strategies: Compensatory Strategies Compensatory strategies: Yes Chin tuck: Ineffective Ineffective Chin Tuck: Thin liquid (Level 0); Mildly thick liquid (Level 2, nectar thick)   General Information: Caregiver present: No  Diet Prior to this Study: NPO; Cortrak/Small bore NG tube   Temperature : Normal   Respiratory Status: WFL   Supplemental O2: None (Room air)   History of Recent Intubation: Yes  Behavior/Cognition: Alert; Cooperative; Requires cueing Self-Feeding Abilities: Needs assist with self-feeding Baseline vocal quality/speech: Dysphonic Volitional Cough: Able to elicit Volitional Swallow: Able to elicit Exam Limitations: No limitations Goal Planning: Prognosis for improved oropharyngeal function: Fair Barriers to Reach Goals: Cognitive deficits; Severity of deficits No data recorded Patient/Family Stated Goal: continued improvement Consulted and agree with results and recommendations: Patient; Physician Pain: Pain Assessment Pain Assessment: Faces Faces Pain Scale: 4 Pain Location: all over Pain Descriptors / Indicators: Aching; Grimacing; Discomfort Pain Intervention(s): Monitored during session End of Session: Start Time:SLP Start Time (ACUTE ONLY): 0957 Stop Time: SLP Stop Time (ACUTE ONLY): 1021 Time Calculation:SLP Time Calculation (min) (ACUTE ONLY): 24 min Charges: SLP Evaluations $ SLP Speech Visit: 1 Visit SLP Evaluations $MBS Swallow: 1 Procedure $Swallowing Treatment: 1 Procedure $Speech Treatment  for Individual: 1 Procedure SLP visit diagnosis: SLP Visit Diagnosis: Dysphagia, oropharyngeal phase (R13.12) Past Medical History: Past Medical History: Diagnosis Date  Anxiety   Back pain   Cancer (HCC)   Depression   Emphysema   Esophageal spasm   Lung nodule   OSA (obstructive sleep apnea)  Osteomalacia   Personal history of radiation therapy   Vertigo   Vitamin D  deficiency  Past Surgical History: Past Surgical History: Procedure Laterality Date  ABDOMINAL HYSTERECTOMY    APPENDECTOMY    BREAST BIOPSY Right 08/31/2020  BREAST EXCISIONAL BIOPSY Left 1978  BREAST LUMPECTOMY Right 09/30/2020  BREAST LUMPECTOMY WITH RADIOACTIVE SEED LOCALIZATION Right 09/30/2020  Procedure: RIGHT BREAST LUMPECTOMY WITH RADIOACTIVE SEED LOCALIZATION;  Surgeon: Curvin Deward MOULD, MD;  Location: [facility];  Service: General;  Laterality: Right;  CARPAL TUNNEL RELEASE    IR CT HEAD LTD  06/25/2024  IR PERCUTANEOUS ART THROMBECTOMY/INFUSION INTRACRANIAL INC DIAG ANGIO  06/25/2024  RADIOLOGY WITH ANESTHESIA N/A 06/25/2024  Procedure: RADIOLOGY WITH ANESTHESIA;  Surgeon: Dolphus Carrion, MD;  Location: [facility] OR;  Service: Radiology;  Laterality: N/A;  ROTATOR CUFF REPAIR   Damien Blumenthal, M.A., CCC-SLP Speech Language Pathology, Acute Rehabilitation Services Secure Chat preferred [phone] 07/08/2024, 11:13 AM  Recent Labs    07/09/24 0431 07/10/24 0456  WBC 7.1 8.4  HGB 11.3* 11.4*  HCT 33.9* 33.3*  PLT 237 229   Recent Labs    07/09/24 0431 07/10/24 0456  NA 137 135  K 4.0 4.0  CL 101 98  CO2 26 26  GLUCOSE 117* 121*  BUN 23 25*  CREATININE 0.68 0.68  CALCIUM  9.4 9.4    Intake/Output Summary (Last 24 hours) at 07/10/2024 0952 Last data filed at 07/10/2024 0802 Gross per 24 hour  Intake 600 ml  Output --  Net 600 ml        Physical Exam: Vital Signs Blood pressure 119/60, pulse (!) 57, temperature 97.6 F (36.4 C), resp. rate 17, height 5' 1 (1.549 m), weight 68 kg, SpO2 97%. Gen: no  distress, normal appearing HENT:     Head: Normocephalic and atraumatic.     Comments: Mild L facial droop Tongue midline  Mild to moderate facial redness- on R cheek    Right Ear: External ear normal.     Left Ear: External ear normal.     Nose: Nose normal. No congestion.     Comments: Cortrak- in nare    Mouth/Throat:     Mouth: Mucous membranes are dry.  Eyes:     Comments: Couldn't complete EOM's for me- but so sleepy  Cardiovascular:     Rate and Rhythm: Bradycardia    Heart sounds: Normal heart sounds. No murmur heard.    No gallop.  Pulmonary:     Effort: Pulmonary effort is normal. No respiratory distress.     Breath sounds: Normal breath sounds. No wheezing, rhonchi or rales.  Abdominal:     General: Bowel sounds are normal. There is no distension.     Palpations: Abdomen is soft.     Tenderness: There is no abdominal tenderness.  Musculoskeletal:     Cervical back: Neck supple.     Comments:  RUE 5-/5 LUE 0/5 throughout RLE- 4+/5 throughout- hard to get pt to do exam LLE- PF 1/5- otherwise 0/5 in LLE, stable 7/24 Skin:    General: Skin is warm and dry.     Comments: R forearm IV Some UE bruising  A lot of moles vs neurofibromas? On skin with actinic keratosis on multiple spots  Neurological:     Mental Status: She is alert.     Comments: Moderate dysarthria with occasional wet voice. Left facial weakness with left inattention. Left HH and needs cues to look to left. Able to smaller numbers on the clock.  She was able to answer basic orientation questions, state age, DOB and PCP name.  Left hemiparesis with sensory deficits.  No clonus, no hoffman's Strong (+) Babinski Flaccid on L side  Psychiatric:     Comments: Sleepy- hard to keep her awake for exam    Assessment/Plan: 1. Functional deficits which require 3+ hours per day of interdisciplinary therapy in a comprehensive inpatient rehab setting. Physiatrist is providing close team supervision and 24 hour  management of active medical problems listed below. Physiatrist and rehab team continue to assess barriers to discharge/monitor patient progress toward functional and medical goals  Care Tool:  Bathing    Body parts bathed by patient: Face   Body parts bathed by helper: Right arm, Left arm, Chest, Abdomen, Front perineal area, Buttocks, Right upper leg, Left upper leg, Right lower leg, Left lower leg     Bathing assist Assist Level: Total Assistance - Patient < 25%     Upper Body Dressing/Undressing Upper body dressing   What is the patient wearing?: Pull over shirt    Upper body assist Assist Level: Maximal Assistance - Patient 25 - 49%    Lower Body Dressing/Undressing Lower body dressing      What is the patient wearing?: Pants, Incontinence brief     Lower body assist Assist for lower body dressing: Total Assistance - Patient < 25%     Toileting Toileting    Toileting assist Assist for toileting: 2 Helpers     Transfers Chair/bed transfer  Transfers assist  Chair/bed transfer activity did not occur: Safety/medical concerns (did not get up)  Chair/bed transfer assist level: 2 Helpers     Locomotion Ambulation   Ambulation assist   Ambulation activity did not occur: Safety/medical concerns          Walk 10 feet activity   Assist  Walk 10 feet activity did not occur: Safety/medical concerns        Walk 50 feet activity   Assist Walk 50 feet with 2 turns activity did not occur: Safety/medical concerns         Walk 150 feet activity   Assist Walk 150 feet activity did not occur: Safety/medical concerns         Walk 10 feet on uneven surface  activity   Assist Walk 10 feet on uneven surfaces activity did not occur: Safety/medical concerns         Wheelchair     Assist Is the patient using a wheelchair?: Yes Type of Wheelchair: Manual    Wheelchair assist level: Dependent - Patient 0%      Wheelchair 50 feet with  2 turns activity    Assist        Assist Level: Dependent - Patient 0%   Wheelchair 150 feet activity     Assist      Assist Level: Dependent - Patient 0%   Blood pressure 119/60, pulse (!) 57, temperature 97.6 F (36.4 C), resp. rate 17, height 5' 1 (1.549 m), weight 68 kg, SpO2 97%.  Medical Problem List and Plan: 1. Functional deficits secondary to R MCA stroke             -patient may  shower             -ELOS/Goals: 16-2 days- min-mod A             Continue CIR  D3 started  2.  Impaired mobility: -DVT/anticoagulation:  Pharmaceutical: continue Lovenox              -  antiplatelet therapy: Continue ASA.  --Plavix recommended if no further procedures planned. Question need for PEG. Attempted to call daughter to discuss PEG but no VM box is set up  3. Muscle spasms in back: Continue lidocaine  patch. Baclofen  5mg  added HS             --sedated from Flexeril  5 mg/tramadol  50 mg this afternoon. Will decrease doses.   4. Hospital induced delirium at night: daughter may stay over at night --has chronic insomnia-->used to just lie in bed and sleeps till 10 am usually.  --Will schedule melatonin at 8 pm tonight. Sleep wake chart to monitor sleep              -antipsychotic agents: N/A  5. Neuropsych/cognition: This patient may be intermittently capable of making decisions on her own behalf.  6. Daytime somnolence: modafinil  100mg  daily ordered  7. Dysphagia: Continue tube feeds-->does not seem to have water  flushes but renal status WNL.   8.  E coli bacteremia: Treated with 10 day course of antibiotics. Ceftriaxone -->Duricef thru 07/14. Thrombocytopenia/leucocytosis has resolved.              --Hematuria has resolved. No dysuria.   9. Chronic back/Right knee pain: Continue voltaren  gel qid to knee and back till family bring in the biofreeze (which works better)             --Lidocaine  patches at nights to help with back spasms. Decrease flexeril  to 2.5 mg every 8 hours  prn             --schedule tylenol  500 mg qid as LFTs resolved.   10. Abnormal LFTs: Have resolved and Lipitor added on 07/19. Continue to monitor weekly for  now.   11. COPD/OSA: Has CPAP but not using PTA. Continue Yupelri  nebs daily (in place of Spiriva).  --Used albuterol  every night PTA.  .  12. Hypomagnesemia: Magnesium  reviewed and has normalized  13. Anemia: Monitor for signs of bleeding. H/H recovering from 13-->9.9-->11.2  14.  Bradycardia: HR note to be in 51 to 61 range and had boderline prolonged Qtc on admission. Likely due to stroke.  Repeat EKG reviewed and is stable  15.  H/o MDD/anxiety: Continue Lexapro .       LOS: 2 days A FACE TO FACE EVALUATION WAS PERFORMED  Sven P Xavien Dauphinais 07/10/2024, 9:52 AM

## 2024-07-10 NOTE — Progress Notes (Signed)
 [facility]patient Rehabilitation Center Individual Statement of Services  Patient Name:  [name]  Date:  07/10/2024  Welcome to the Inpatient Rehabilitation Center.  Our goal is to provide you with an individualized program based on your diagnosis and situation, designed to meet your specific needs.  With this comprehensive rehabilitation program, you will be expected to participate in at least 3 hours of rehabilitation therapies Monday-Friday, with modified therapy programming on the weekends.  Your rehabilitation program will include the following services:  Physical Therapy (PT), Occupational Therapy (OT), Speech Therapy (ST), 24 hour per day rehabilitation nursing, Neuropsychology, Care Coordinator, Rehabilitation Medicine, Nutrition Services, and Pharmacy Services  Weekly team conferences will be held on Wednesdays to discuss your progress.  Your Inpatient Rehabilitation Care Coordinator will talk with you frequently to get your input and to update you on team discussions.  Team conferences with you and your family in attendance may also be held.  Expected length of stay: 3-4 weeks  Overall anticipated outcome: Mod assist overall  Depending on your progress and recovery, your program may change. Your Inpatient Rehabilitation Care Coordinator will coordinate services and will keep you informed of any changes. Your Inpatient Rehabilitation Care Coordinator's name and contact numbers are listed  below.  The following services may also be recommended but are not provided by the Inpatient Rehabilitation Center:   Home Health Rehabiltiation Services Outpatient Rehabilitation Services   Arrangements will be made to provide these services after discharge if needed.  Arrangements include referral to agencies that provide these services.  Your insurance has been verified to be:  [employer] Your primary doctor is:  Sari Pay , MD  Pertinent information will be shared with your doctor and  your insurance company.  Inpatient Rehabilitation Care Coordinator:  Rhoda Clement, KEN [phone] or ([phone]  Information discussed with and copy given to patient by: [name], 07/10/2024, 8:51 AM

## 2024-07-10 NOTE — Plan of Care (Signed)
  Problem: Consults Goal: RH STROKE PATIENT EDUCATION Description: See Patient Education module for education specifics  Outcome: Progressing Goal: Nutrition Consult-if indicated Outcome: Progressing   Problem: RH BOWEL ELIMINATION Goal: RH STG MANAGE BOWEL WITH ASSISTANCE Description: STG Manage Bowel with min Assistance. Outcome: Progressing   Problem: RH BLADDER ELIMINATION Goal: RH STG MANAGE BLADDER WITH ASSISTANCE Description: STG Manage Bladder With min Assistance Outcome: Progressing   Problem: RH KNOWLEDGE DEFICIT Goal: RH STG INCREASE KNOWLEDGE OF DYSPHAGIA/FLUID INTAKE Description: Patient and family will be able to demonstrate understanding of dietary modifications to prevent complications related to stroke independently using handout provided.  Outcome: Progressing Goal: RH STG INCREASE KNOWLEGDE OF HYPERLIPIDEMIA Description: Patient and family will be able to demonstrate understanding of medication management and dietary modifications to better control cholesterol levels to prevent further occurrence of stroke independently using handout provided.  Outcome: Progressing Goal: RH STG INCREASE KNOWLEDGE OF STROKE PROPHYLAXIS Description: Patient and family will be able to demonstrate understanding of medication management and dietary modifications to better control cholesterol levels to prevent further occurrence of stroke independently using handout provided.  Outcome: Progressing   Problem: Education: Goal: Ability to describe self-care measures that may prevent or decrease complications (Diabetes Survival Skills Education) will improve Outcome: Progressing Goal: Individualized Educational Video(s) Outcome: Progressing   Problem: Coping: Goal: Ability to adjust to condition or change in health will improve Outcome: Progressing   Problem: Fluid Volume: Goal: Ability to maintain a balanced intake and output will improve Outcome: Progressing   Problem: Health  Behavior/Discharge Planning: Goal: Ability to identify and utilize available resources and services will improve Outcome: Progressing Goal: Ability to manage health-related needs will improve Outcome: Progressing   Problem: RH Vision Goal: RH LTG Vision (Specify) Outcome: Progressing

## 2024-07-10 NOTE — Progress Notes (Signed)
 Inpatient Rehabilitation Care Coordinator Assessment and Plan Patient Details  Name: [name] MRN: [id number] Date of Birth: 08-Jan-1932  Today's Date: 07/10/2024  Hospital Problems: Principal Problem:   Acute ischemic right MCA stroke [facility])  Past Medical History:  Past Medical History:  Diagnosis Date   Anxiety    Back pain    Cancer (HCC)    Depression    Emphysema    Esophageal spasm    Lung nodule    OSA (obstructive sleep apnea)    Osteomalacia    Personal history of radiation therapy    Vertigo    Vitamin D  deficiency    Past Surgical History:  Past Surgical History:  Procedure Laterality Date   ABDOMINAL HYSTERECTOMY     APPENDECTOMY     BREAST BIOPSY Right 08/31/2020   BREAST EXCISIONAL BIOPSY Left 1978   BREAST LUMPECTOMY Right 09/30/2020   BREAST LUMPECTOMY WITH RADIOACTIVE SEED LOCALIZATION Right 09/30/2020   Procedure: RIGHT BREAST LUMPECTOMY WITH RADIOACTIVE SEED LOCALIZATION;  Surgeon: Curvin Deward MOULD, MD;  Location: [facility];  Service: General;  Laterality: Right;   CARPAL TUNNEL RELEASE     IR CT HEAD LTD  06/25/2024   IR PERCUTANEOUS ART THROMBECTOMY/INFUSION INTRACRANIAL INC DIAG ANGIO  06/25/2024   RADIOLOGY WITH ANESTHESIA N/A 06/25/2024   Procedure: RADIOLOGY WITH ANESTHESIA;  Surgeon: Dolphus Carrion, MD;  Location: [facility] OR;  Service: Radiology;  Laterality: N/A;   ROTATOR CUFF REPAIR     Social History:  reports that she quit smoking about 8 years ago. Her smoking use included cigarettes. She started smoking about 64 years ago. She has a 56 pack-year smoking history. She has never used smokeless tobacco. She reports that she does not drink alcohol  and does not use drugs.  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; taught elementary school Health Literacy - How often do you need to have someone help you when you read instructions, pamphlets, or other written material from your doctor or pharmacy?: Sometimes Writes: Yes Employment Status: Retired   Abuse/Neglect Abuse/Neglect Assessment Can Be Completed: Yes Physical Abuse: Denies Verbal Abuse: Denies Sexual Abuse: Denies Exploitation of patient/patient's resources: Denies Self-Neglect: Denies  Patient response to: Social Isolation - How often do you feel lonely or isolated from those around you?: Never  Emotional Status Pt's affect, behavior and adjustment status: Lethargy; slow to arouse but once awakened, humourous/sassy responses to questions. Recent Psychosocial Issues: Recent hallucinations Psychiatric History: MDD/anxiety on Lexapro  Substance Abuse History: N/A  Patient / Family Perceptions, Expectations & Goals Pt/Family understanding of illness & functional limitations: Daughters have a good understanding of the current illness and functional limitations Premorbid pt/family roles/activities: Lived alone with some assistance from a caregiver 9 hours/day/week Anticipated changes in roles/activities/participation: Anticipate need for 24/7 caregivers providing up to mod assistance for ADLs and care needs. Pt/family expectations/goals: Daughter(s) would like for the patient to be as mobile as possible, able to get up to a recliner even if she needs to use a wheelchair, be able to get up and out some and be able to assist wtih self care some.  [employer]: None  Premorbid Home Care/DME Agencies: None Transportation available at discharge:  Daughters to provide for transportation Is the patient able to respond to transportation needs?: Yes In the past 12 months, has lack of transportation kept you from medical appointments or from getting medications?: No In the past 12 months, has lack of transportation kept you from meetings, work, or from getting things needed for daily living?: No Resource referrals recommended: Neuropsychology, Support group (specify)  Discharge Planning Living Arrangements: Children Support Systems: Children, Home care staff Type of Residence: Private residence Insurance Resources: Medicare Financial Screen Referred: No Living Expenses: Own Money Management: Family Does the patient have any problems obtaining your medications?: No Home Management: Doyal (caregiver) managed the home/laundry/cleaning PTA Patient/Family Preliminary Plans: Daughters plan to extend caregiver time and rotate between the children to cover times when caregiver not available to provide for 24/7 care Care Coordinator Barriers to Discharge: Nutrition means, Incontinence Care Coordinator Barriers to Discharge Comments: Nutritional means; currently with cortrak PEG placement possible Care Coordinator Anticipated Follow Up Needs: HH/OP DC Planning Additional Notes/Comments: Education on safety, PEG care/use, skin care, mobility, etc. Expected length of stay: ELOS  3-4 weeks and discharge goals for mod assist overall.  Clinical Impression Patient has a fair potential to meet mod assist goals for discharge. Failed MBS x2 and currently maintained on a cortrak for nutritional means, PEG placement possible for discharge. Patient was doing fairly well PTA; lived alone and had a caregiver for 5 hours/day/week to assist and has DME.  [name] 07/10/2024, 8:49 AM

## 2024-07-10 NOTE — Progress Notes (Signed)
 Occupational Therapy Session Note  Patient Details  Name: [name] MRN: [id number] Date of Birth: February 07, 1932  Today's Date: 07/10/2024 OT Individual Time: 8884-8786 OT Individual Time Calculation (min): 58 min    Short Term Goals: Week 1:  OT Short Term Goal 1 (Week 1): Patient will sit with min assist x 5 seconds as needed prior to transfer OT Short Term Goal 2 (Week 1): Patient will actively lean forward in midline while sitting to prepare for sit to stand OT Short Term Goal 3 (Week 1): Patient will visually locate item 15* left of midline with mod cueing OT Short Term Goal 4 (Week 1): Patient will locate left arm on body in functional context - during bathing and dressing  Skilled Therapeutic Interventions/Progress Updates:    Patient received reclined in wheelchair with feet propped up on recliner.  Two of patient's daughters present and requesting patient get into recliner or back to bed as she reports pain.  Removed some support from behind patient's head, and around patient's torso, and placed feet on leg rests.  Worked to place head on head rest in midline with slight recline to chair.  Obtained more supportive wheelchair cushion. Patient transported to gym to address postural control. Worked on modified stand pivot transfer to mat table and weight shifting toward right side.  Patient with ability to sustain a stacked sitting position x 2-3 seconds at best this session.  Continued focus on weight shifting to right and return to midline.  Transferred back to chair with new cushion.  At conclusion of session patient reports pain 5/10 in back.  Patient transported back to room and back to bed.  Patient indicated need to void and assisted to bed pan, and changed into clean and dry brief.  Patient left in bed with bed alarm engaged and call bell in reach.  Daughter at bedside.    Therapy Documentation Precautions:  Precautions Precautions: Fall Recall of Precautions/Restrictions:  Impaired Precaution/Restrictions Comments: Cortrak- NPO; dense L Hemi Restrictions Weight Bearing Restrictions Per Provider Order: No   Pain: Pain Assessment Pain Scale: 0-10 Pain Score: 10  Pain Location: back Repositioned patient in wheelchair, and provided new cushion to chair.       Therapy/Group: Individual Therapy  Dajane Valli M 07/10/2024, 12:33 PM

## 2024-07-10 NOTE — Patient Care Conference (Signed)
 Inpatient RehabilitationTeam Conference and Plan of Care Update Date: 07/09/2024   Time: 1117 am    Patient Name: [name]      Medical Record Number: [id number]  Date of Birth: 22-May-1932 Sex: Female         Room/Bed: 4W26C/4W26C-01 Payor Info: Payor: [employer] MEDICARE / Plan: [employer] MEDICARE CHOICE PPO / Product Type: *No Product type* /    Admit Date/Time:  07/08/2024  3:21 PM  Primary Diagnosis:  Acute ischemic right MCA stroke [facility])  Hospital Problems: Principal Problem:   Acute ischemic right MCA stroke [facility])    Expected Discharge Date: Expected Discharge Date:  (pending)  Team Members Present: Physician leading conference: Dr. Sven Elks Social Worker Present:  Versa Ronde RN) Nurse Present: Eulalio Falls, RN PT Present: Sherlean Perks, PT OT Present: Monica Peacock, OT SLP Present: [name], SLP PPS Coordinator present : Eleanor Colon, SLP     Current Status/Progress Goal Weekly Team Focus  Bowel/Bladder   pt is incontient of b/b. last bm 7/22   remain moisture free   offer toileting q4h/ prn    Swallow/Nutrition/ Hydration   eval pending           ADL's   total assist/max assist   Mod assist   postural control, endurance, sitting balance, participation with basic self care skills    Mobility   PT EVAL PENDING           Communication   eval pending            Safety/Cognition/ Behavioral Observations  eval pending            Pain   pt c/o lower back pain near coccyx of 10/10   manage pain level <6/10   assess pain qshift/ prn    Skin   breakdown bilaterally on buttocks with redness. skin intact   skin remain mositure free  assess q shift/ prn      Discharge Planning:  Evals pending; per chart, patient lived alone, has an aide 5 hours/day/5 days/wk PTA    Team Discussion: Patient admitted post Right MCA stroke. Patient with severe dysphagia currently has cortrak plan for PEG placement. Patient with pain:  medication adjusted by MD. Progress limited by anxiety/depression, poor sitting balance.  Patient on target to meet rehab goals: Evals pending  *See Care Plan and progress notes for long and short-term goals.   Revisions to Treatment Plan:  15/7 therapy   Teaching Needs: Safety, medications, dietary modifications, transfers, toileting, etc   Current Barriers to Discharge: Decreased caregiver support, Home enviroment access/layout, Weight, and Nutritional means  Possible Resolutions to Barriers: Family education     Medical Summary Current Status: lethargic, low back pain, polypharamcy, dysphagia, acute right MCA CVA, insomnia  Barriers to Discharge: Medical stability  Barriers to Discharge Comments: letahrgic, low back pain, polypharmacy, dysphagia, acute right MCA CVA, insomnia Possible Resolutions to [employer] Focus: decreased flexeril  and tramadol , start D3, start metanx, continue lidocaine  patch, continue Cortrak, discussed with SLP potential PEG, continue aspirin /statin/Lovenox , continue melatonin   Continued Need for Acute Rehabilitation Level of Care: The patient requires daily medical management by a physician with specialized training in physical medicine and rehabilitation for the following reasons: Direction of a multidisciplinary physical rehabilitation program to maximize functional independence : Yes Medical management of patient stability for increased activity during participation in an intensive rehabilitation regime.: Yes Analysis of laboratory values and/or radiology reports with any subsequent need for medication adjustment and/or medical intervention. :  Yes   I attest that I was present, lead the team conference, and concur with the assessment and plan of the team.   Breonna Gafford Gayo 07/09/2024, 1117 am

## 2024-07-11 DIAGNOSIS — I63511 Cerebral infarction due to unspecified occlusion or stenosis of right middle cerebral artery: Secondary | ICD-10-CM | POA: Diagnosis not present

## 2024-07-11 MED ORDER — CEFAZOLIN SODIUM-DEXTROSE 2-4 GM/100ML-% IV SOLN: 2.0000 g | INTRAVENOUS | Status: AC

## 2024-07-11 MED ORDER — MENTHOL (TOPICAL ANALGESIC) 4 % EX GEL
1.0000 | Freq: Four times a day (QID) | CUTANEOUS | Status: DC
  Administered 2024-07-16 – 2024-07-31 (×44): 1 via CUTANEOUS
  Filled 2024-07-11 (×5): qty 1

## 2024-07-11 MED ORDER — VITAMIN D 25 MCG (1000 UNIT) PO TABS
2000.0000 [IU] | ORAL_TABLET | Freq: Every day | ORAL | Status: DC
  Administered 2024-07-12 – 2024-07-13 (×2): 2000 [IU] via ORAL
  Filled 2024-07-11 (×2): qty 2

## 2024-07-11 MED ORDER — TRAMADOL HCL 50 MG PO TABS
50.0000 mg | ORAL_TABLET | Freq: Three times a day (TID) | ORAL | Status: DC | PRN
  Administered 2024-07-14 – 2024-07-16 (×3): 50 mg
  Filled 2024-07-11 (×4): qty 1

## 2024-07-11 MED ORDER — NON FORMULARY: 1.0000 | Freq: Three times a day (TID) | Status: DC

## 2024-07-11 MED ORDER — IOHEXOL 300 MG/ML  SOLN
75.0000 mL | Freq: Once | INTRAMUSCULAR | Status: AC
  Administered 2024-07-13: 75 mL

## 2024-07-11 NOTE — Consult Note (Addendum)
 Chief Complaint: Dysphagia  Referring Provider(s): Dr. Lorilee  Supervising Physician: Jenna Hacker  Patient Status: [facility] - In-pt  History of Present Illness: [name] is a 89 y.o. female with h/o OSA, macular degeneration, COPD, R BCA (s/p lumpectomy/XRT), dysphagia who presented to the ED for RUQ abd pain, fever, and N/V/D. Abnormal LFTs. Admitted for possible acalculous cholecystitis, started on abx and IVF. MRCP 07/06 was negative for CBD stones, dilatation or acute cholecystitis and showed probable hemangioma R-hepatic lobe.   7/9 -  developed AMS with left facial droop, right gaze deviation, left sided weakness and slurred speech. CTA head/neck showed abrupt occlusion of distal M1 segment of R-MCA, TNK administered and she underwent cerebral angio with endovascular TICI 2 B revascularization of R-MCA by Dr. Dolphus.  7/10 - extubated, post procedure CT with small SAH. MRI brain done revealing evolving R-MCA infarct with mild associated petechial blood products right frontoparietal region and small volume acute SAH base of sylvian fissure.  7/22 - most recent MBS, showed severe oropharyngeal dysphagia  Patient continues to receive TF via NG. PEG discussed by IP team with family who agrees with this plan.   Tolerating room air.  Denies fever, chills, pain, abd pain.   Patient has been intermittently oriented. Spoke with her about planned procedure. She agrees with her family's consent for g tube. She is up in her chair participating in therapy at time of exam.   Allergies Reviewed:  Adhesive [tape], Ativan  [lorazepam ], and Latex   Patient is Full Code  Past Medical History:  Diagnosis Date   Anxiety    Back pain    Cancer (HCC)    Depression    Emphysema    Esophageal spasm    Lung nodule    OSA (obstructive sleep apnea)    Osteomalacia    Personal history of radiation therapy    Vertigo    Vitamin D  deficiency     Past Surgical History:   Procedure Laterality Date   ABDOMINAL HYSTERECTOMY     APPENDECTOMY     BREAST BIOPSY Right 08/31/2020   BREAST EXCISIONAL BIOPSY Left 1978   BREAST LUMPECTOMY Right 09/30/2020   BREAST LUMPECTOMY WITH RADIOACTIVE SEED LOCALIZATION Right 09/30/2020   Procedure: RIGHT BREAST LUMPECTOMY WITH RADIOACTIVE SEED LOCALIZATION;  Surgeon: Curvin Deward MOULD, MD;  Location: [facility];  Service: General;  Laterality: Right;   CARPAL TUNNEL RELEASE     IR CT HEAD LTD  06/25/2024   IR PERCUTANEOUS ART THROMBECTOMY/INFUSION INTRACRANIAL INC DIAG ANGIO  06/25/2024   RADIOLOGY WITH ANESTHESIA N/A 06/25/2024   Procedure: RADIOLOGY WITH ANESTHESIA;  Surgeon: Dolphus Carrion, MD;  Location: [facility];  Service: Radiology;  Laterality: N/A;   ROTATOR CUFF REPAIR        Medications: Prior to Admission medications   Medication Sig Start Date End Date Taking? Authorizing Provider  acetaminophen  (TYLENOL ) 650 MG CR tablet Take 650 mg by mouth 3 (three) times daily as needed for pain.    [provider]  albuterol  (PROVENTIL  HFA;VENTOLIN  HFA) 108 (90 Base) MCG/ACT inhaler Inhale 2 puffs into the lungs every 6 (six) hours as needed for wheezing or shortness of breath.    [provider]  artificial tears ophthalmic solution Place 1 drop into both eyes as needed for dry eyes. 07/08/24   de Clint Kill, Cortney E, NP  aspirin  EC 81 MG tablet Take 81 mg by mouth daily.    [provider]  atorvastatin  (LIPITOR)  10 MG tablet Take 10 mg by mouth at bedtime.    [provider]  Cholecalciferol  (VITAMIN D ) 2000 units tablet Take 4,000 Units by mouth daily.     [provider]  cyclobenzaprine  (FLEXERIL ) 5 MG tablet Place 1 tablet (5 mg total) into feeding tube every 8 (eight) hours as needed for muscle spasms. 07/08/24   de Clint Kill, Cortney E, NP  diclofenac  Sodium (VOLTAREN ) 1 % GEL Apply 2 g topically 4 (four) times daily. 07/08/24   de Clint Kill, Cortney E, NP   escitalopram  (LEXAPRO ) 20 MG tablet Take 40 mg by mouth daily. 08/01/17   [provider]  lidocaine  (XYLOCAINE ) 5 % ointment Apply topically daily as needed (Apply to back for pain as needed). 07/08/24   de Clint Kill, Cortney E, NP  melatonin 3 MG TABS tablet Place 2 tablets (6 mg total) into feeding tube at bedtime as needed. 07/08/24   de Clint Kill, Cortney E, NP  Multiple Vitamin (MULTIVITAMIN WITH MINERALS) TABS tablet Take 1 tablet by mouth daily.    [provider]  nitroGLYCERIN  (NITROSTAT ) 0.4 MG SL tablet Place 1 tablet (0.4 mg total) under the tongue every 5 (five) minutes as needed for chest pain. 07/08/24   de Clint Kill, Cortney E, NP  Nutritional Supplements (FEEDING SUPPLEMENT, OSMOLITE 1.5 CAL,) LIQD Place 1,000 mLs into feeding tube continuous. 07/08/24   de Clint Kill, Cortney E, NP  Nystatin (GERHARDT'S BUTT CREAM) CREA Apply 1 Application topically 3 (three) times daily. 07/08/24   de Clint Kill, Cortney E, NP  pantoprazole  (PROTONIX ) 40 MG tablet Take 40 mg by mouth daily.    [provider]  polyethylene glycol (MIRALAX  / GLYCOLAX ) 17 g packet Place 17 g into feeding tube daily as needed for mild constipation. 07/08/24   de Clint Kill, Cortney E, NP  Protein (FEEDING SUPPLEMENT, PROSOURCE TF20,) liquid Place 60 mLs into feeding tube daily. 07/09/24   de Clint Kill, Cortney E, NP  tiotropium (SPIRIVA) 18 MCG inhalation capsule Place 18 mcg into inhaler and inhale daily.    [provider]     Family History  Problem Relation Age of Onset   Diabetes type II Other    Hypertension Other    Breast cancer Maternal Aunt        dx early 22s   Other Mother        died of natural causes at age 29   Other Father        stroke or heart attack while in shower   Diabetes type II Sister    Cancer Paternal Uncle        unknown type; dx early 70s    Social History   Socioeconomic History   Marital status: Widowed    Spouse name: Not on file   Number of  children: 4   Years of education: Bachelors   Highest education level: Not on file  Occupational History   Occupation: Retired  Tobacco Use   Smoking status: Former    Current packs/day: 0.00    Average packs/day: 1 pack/day for 56.0 years (56.0 ttl pk-yrs)    Types: Cigarettes    Start date: 03/1960    Quit date: 03/2016    Years since quitting: 8.3   Smokeless tobacco: Never  Vaping Use   Vaping status: Never Used  Substance and Sexual Activity   Alcohol  use: No    Alcohol /week: 0.0 standard drinks of alcohol    Drug use: No  Sexual activity: Not Currently  Other Topics Concern   Not on file  Social History Narrative   Lives at home alone.   Right-handed.   1.5 cups caffeine per day, occasional Diet Coke.   Social Drivers of Corporate investment banker Strain: Not on file  Food Insecurity: No Food Insecurity (06/22/2024)   Hunger Vital Sign    Worried About Running Out of Food in the Last Year: Never true    Ran Out of Food in the Last Year: Never true  Transportation Needs: No Transportation Needs (06/22/2024)   PRAPARE - Administrator, Civil Service (Medical): No    Lack of Transportation (Non-Medical): No  Physical Activity: Not on file  Stress: Not on file  Social Connections: Socially Isolated (06/22/2024)   Social Connection and Isolation Panel    Frequency of Communication with Friends and Family: Never    Frequency of Social Gatherings with Friends and Family: Never    Attends Religious Services: Never    Database administrator or Organizations: Yes    Attends Banker Meetings: 1 to 4 times per year    Marital Status: Widowed     Review of Systems: A 12 point ROS discussed and pertinent positives are indicated in the HPI above.  All other systems are negative.    Vital Signs: BP (!) 114/48 (BP Location: Left Arm)   Pulse 65   Temp 97.7 F (36.5 C)   Resp 17   Ht 5' 1 (1.549 m)   Wt 150 lb 5.7 oz (68.2 kg)   SpO2 96%   BMI  28.41 kg/m     Physical Exam HENT:     Nose:     Comments: NG with bridle in place    Mouth/Throat:     Mouth: Mucous membranes are moist.     Pharynx: Oropharynx is clear.  Cardiovascular:     Rate and Rhythm: Normal rate and regular rhythm.     Pulses: Normal pulses.     Heart sounds: Normal heart sounds.  Pulmonary:     Effort: Pulmonary effort is normal.     Breath sounds: Normal breath sounds.  Abdominal:     General: There is no distension.     Palpations: Abdomen is soft.     Tenderness: There is no abdominal tenderness.     Comments: Diffuse healing bruises, likely related to lovenox   Neurological:     Mental Status: She is alert.     Motor: Weakness present.     Comments: L sided weakness  Psychiatric:        Mood and Affect: Mood normal.        Behavior: Behavior normal.        Thought Content: Thought content normal.        Judgment: Judgment normal.     Imaging: DG Swallowing Func-Speech Pathology Result Date: 07/08/2024 Table formatting from the original result was not included. Modified Barium Swallow Study Patient Details Name: SHERRELLE PROCHAZKA MRN: [id number] Date of Birth: 02-Mar-1932 Today's Date: 07/08/2024 HPI/PMH: HPI: ELISANDRA DESHMUKH is an 89 yo female presenting to ED 7/5 for RUQ pain, nausea/vomiting. Found to have suspected cholecystitis vs passed choledocholithiasis and acute liver injury, hyponatremia, bacteriuria, and hematuria. Initially seen at [facility] ED 7/3 with n/v and elevated LFTs but left without being seen. Daughter noted sudden onset AMS and L sided weakness 7/9 with CTA showing abrupt occlusion of distal R MCA M1 s/p TNK.  Taken to IR with unsuccessful revascularization (repeated reocclusion), post-procedure CTH showed small SAH. Remained intubated after the procedure, 7/9-7/10. PMH includes HLD, PAD, COPD with emphysema, OSA, R breast cancer, back pain, esophageal spasm Clinical Impression: Clinical Impression: Pt exhibits severe oropharyngeal  dysphagia, which is considered slightly worse compared to previous MBS 7/11 but is suspected to primarily be impacted by the amount of trials observed today. She was alert and participatory but delays related to initiation continue to result in gross aspiration of thin, nectar, and honey thick liquids most notably after the swallow secondary to reduced control of oral residuals as they progress through the pharynx. While she senses all aspiration and attempts to cough forcefully, it is ineffective at clearing the entire volume of the aspirated bolus (PAS 7). She was able to follow commands to attempt compensatory strategies but a chin tuck posture was ineffective with both thin and nectar thick liquids. Biofeedback was beneficial in cueing her to use a more timely subswallow but this cannot consistently be utilized clinically. There was less residue with purees and no penetration/aspiration. Recommend she remain NPO except ice chips after oral care. Plan to f/u to provide education to pt and her daughters. Will continue following to target ESMT and use of prompt subswallows with trials of puree in an effort to make this an effective way to prevent aspiration with liquids. Factors that may increase risk of adverse event in presence of aspiration [name] & [name] 2021): Factors that may increase risk of adverse event in presence of aspiration [name] & [name] 2021): Reduced cognitive function; Limited mobility; Presence of tubes (ETT, trach, NG, etc.); Frequent aspiration of large volumes Recommendations/Plan: Swallowing Evaluation Recommendations Swallowing Evaluation Recommendations Recommendations: NPO; Ice chips PRN after oral care Medication Administration: Via alternative means Oral care recommendations: Oral care QID (4x/day); Oral care before ice chips/water  Recommended consults: Consider Palliative care Treatment Plan Treatment Plan Treatment recommendations: Therapy as outlined in treatment plan below  Follow-up recommendations: Acute inpatient rehab (3 hours/day) Functional status assessment: Patient has had a recent decline in their functional status and demonstrates the ability to make significant improvements in function in a reasonable and predictable amount of time. Treatment frequency: Min 2x/week Treatment duration: 2 weeks Interventions: Aspiration precaution training; Oropharyngeal exercises; Compensatory techniques; Patient/family education; Trials of upgraded texture/liquids Recommendations Recommendations for follow up therapy are one component of a multi-disciplinary discharge planning process, led by the attending physician.  Recommendations may be updated based on patient status, additional functional criteria and insurance authorization. Assessment: Orofacial Exam: Orofacial Exam Oral Cavity: Oral Hygiene: WFL Oral Cavity - Dentition: Dentures, not available; Poor condition; Missing dentition Orofacial Anatomy: WFL Oral Motor/Sensory Function: Suspected cranial nerve impairment CN V - Trigeminal: WFL CN VII - Facial: Left motor impairment CN IX - Glossopharyngeal, CN X - Vagus: Left motor impairment CN XII - Hypoglossal: Left motor impairment Anatomy: Anatomy: Suspected cervical osteophytes; Prominent cricopharyngeus Boluses Administered: Boluses Administered Boluses Administered: Thin liquids (Level 0); Mildly thick liquids (Level 2, nectar thick); Moderately thick liquids (Level 3, honey thick); Puree  Oral Impairment Domain: Oral Impairment Domain Lip Closure: Escape progressing to mid-chin Tongue control during bolus hold: Posterior escape of less than half of bolus Bolus transport/lingual motion: Slow tongue motion Oral residue: Residue collection on oral structures Location of oral residue : Tongue; Palate Initiation of pharyngeal swallow : Pyriform sinuses  Pharyngeal Impairment Domain: Pharyngeal Impairment Domain Soft palate elevation: No bolus between soft palate (SP)/pharyngeal wall  (PW) Laryngeal elevation: Complete superior movement  of thyroid  cartilage with complete approximation of arytenoids to epiglottic petiole Anterior hyoid excursion: Complete anterior movement Epiglottic movement: Partial inversion Laryngeal vestibule closure: Complete, no air/contrast in laryngeal vestibule Pharyngeal stripping wave : Present - complete Pharyngeal contraction (A/P view only): N/A Pharyngoesophageal segment opening: Partial distention/partial duration, partial obstruction of flow Tongue base retraction: Narrow column of contrast or air between tongue base and PPW Pharyngeal residue: Collection of residue within or on pharyngeal structures Location of pharyngeal residue: Tongue base; Valleculae; Pyriform sinuses  Esophageal Impairment Domain: No data recorded Pill: No data recorded Penetration/Aspiration Scale Score: Penetration/Aspiration Scale Score 1.  Material does not enter airway: Puree 7.  Material enters airway, passes BELOW cords and not ejected out despite cough attempt by patient: Thin liquids (Level 0); Mildly thick liquids (Level 2, nectar thick); Moderately thick liquids (Level 3, honey thick) Compensatory Strategies: Compensatory Strategies Compensatory strategies: Yes Chin tuck: Ineffective Ineffective Chin Tuck: Thin liquid (Level 0); Mildly thick liquid (Level 2, nectar thick)   General Information: Caregiver present: No  Diet Prior to this Study: NPO; Cortrak/Small bore NG tube   Temperature : Normal   Respiratory Status: WFL   Supplemental O2: None (Room air)   History of Recent Intubation: Yes  Behavior/Cognition: Alert; Cooperative; Requires cueing Self-Feeding Abilities: Needs assist with self-feeding Baseline vocal quality/speech: Dysphonic Volitional Cough: Able to elicit Volitional Swallow: Able to elicit Exam Limitations: No limitations Goal Planning: Prognosis for improved oropharyngeal function: Fair Barriers to Reach Goals: Cognitive deficits; Severity of deficits No data  recorded Patient/Family Stated Goal: continued improvement Consulted and agree with results and recommendations: Patient; Physician Pain: Pain Assessment Pain Assessment: Faces Faces Pain Scale: 4 Pain Location: all over Pain Descriptors / Indicators: Aching; Grimacing; Discomfort Pain Intervention(s): Monitored during session End of Session: Start Time:SLP Start Time (ACUTE ONLY): 0957 Stop Time: SLP Stop Time (ACUTE ONLY): 1021 Time Calculation:SLP Time Calculation (min) (ACUTE ONLY): 24 min Charges: SLP Evaluations $ SLP Speech Visit: 1 Visit SLP Evaluations $MBS Swallow: 1 Procedure $Swallowing Treatment: 1 Procedure $Speech Treatment for Individual: 1 Procedure SLP visit diagnosis: SLP Visit Diagnosis: Dysphagia, oropharyngeal phase (R13.12) Past Medical History: Past Medical History: Diagnosis Date  Anxiety   Back pain   Cancer (HCC)   Depression   Emphysema   Esophageal spasm   Lung nodule   OSA (obstructive sleep apnea)   Osteomalacia   Personal history of radiation therapy   Vertigo   Vitamin D  deficiency  Past Surgical History: Past Surgical History: Procedure Laterality Date  ABDOMINAL HYSTERECTOMY    APPENDECTOMY    BREAST BIOPSY Right 08/31/2020  BREAST EXCISIONAL BIOPSY Left 1978  BREAST LUMPECTOMY Right 09/30/2020  BREAST LUMPECTOMY WITH RADIOACTIVE SEED LOCALIZATION Right 09/30/2020  Procedure: RIGHT BREAST LUMPECTOMY WITH RADIOACTIVE SEED LOCALIZATION;  Surgeon: Curvin Deward MOULD, MD;  Location: [facility];  Service: General;  Laterality: Right;  CARPAL TUNNEL RELEASE    IR CT HEAD LTD  06/25/2024  IR PERCUTANEOUS ART THROMBECTOMY/INFUSION INTRACRANIAL INC DIAG ANGIO  06/25/2024  RADIOLOGY WITH ANESTHESIA N/A 06/25/2024  Procedure: RADIOLOGY WITH ANESTHESIA;  Surgeon: Dolphus Carrion, MD;  Location: [facility];  Service: Radiology;  Laterality: N/A;  ROTATOR CUFF REPAIR   Damien Blumenthal, M.A., CCC-SLP Speech Language Pathology, Acute Rehabilitation Services Secure Chat preferred [phone]  07/08/2024, 11:13 AM  DG Swallowing Func-Speech Pathology Result Date: 06/27/2024 Table formatting from the original result was not included. Modified Barium Swallow Study Patient Details Name: EMILYROSE DARRAH MRN: [id number] Date of Birth: 03/25/1932 Today's  Date: 06/27/2024 HPI/PMH: HPI: JENAVIE STANCZAK is an 89 yo female presenting to ED 7/5 for RUQ pain, nausea/vomiting. Found to have suspected cholecystitis vs passed choledocholithiasis and acute liver injury, hyponatremia, bacteriuria, and hematuria. Initially seen at [facility] ED 7/3 with n/v and elevated LFTs but left without being seen. Daughter noted sudden onset AMS and L sided weakness 7/9 with CTA showing abrupt occlusion of distal R MCA M1 s/p TNK. Taken to IR with unsuccessful revascularization (repeated reocclusion), post-procedure CTH showed small SAH. Remained intubated after the procedure, 7/9-7/10. PMH includes HLD, PAD, COPD with emphysema, OSA, R breast cancer, back pain, esophageal spasm Clinical Impression: Clinical Impression: Pt exhibits moderate oropharyngeal dyspahgia characterized by mistiming that may also be impacted by chronic factors including suspected cervical osteophytes and a prominent cricopharyngeus. She often achieves complete laryngeal elevation evidenced in the instances of swallow initiation occurring as the bolus reaches her vocal folds and is effectively expelled but this is inconsistent. Thin and nectar thick liquids are ultimately aspirated, and although she is sensate, her cough is not forceful enough to eject aspirates (PAS 7). Oral transit and lingual motion are slow with all consistencies, but most noticeably with honey thick liquids and purees as they inch through the mouth and pharynx, past the open laryngeal vestibule until she initiates a swallow at the level of the pyriform sinues. Dempsey penetration occurs with honey thick liquids occurs and goes unsensed (PAS 5). Residue consistently collects in the oral cavity and  she cannot consistently follow commands to swallow again. The amount of cueing required to initiate use of compensatory strategies makes them ineffective. In addition to these impairments, her mentation is a significant barrier to having POs at this time. Recommend NPO except ice chips in moderation after oral care. SLP will continue following. Factors that may increase risk of adverse event in presence of aspiration [name] & [name] 2021): Factors that may increase risk of adverse event in presence of aspiration [name] & [name] 2021): Reduced cognitive function; Limited mobility; Presence of tubes (ETT, trach, NG, etc.); Frequent aspiration of large volumes Recommendations/Plan: Swallowing Evaluation Recommendations Swallowing Evaluation Recommendations Recommendations: NPO; Ice chips PRN after oral care Medication Administration: Via alternative means Oral care recommendations: Oral care QID (4x/day); Oral care before ice chips/water  Treatment Plan Treatment Plan Treatment recommendations: Therapy as outlined in treatment plan below Follow-up recommendations: Skilled nursing-short term rehab (<3 hours/day) Functional status assessment: Patient has had a recent decline in their functional status and demonstrates the ability to make significant improvements in function in a reasonable and predictable amount of time. Treatment frequency: Min 2x/week Treatment duration: 2 weeks Interventions: Aspiration precaution training; Oropharyngeal exercises; Compensatory techniques; Patient/family education; Trials of upgraded texture/liquids Recommendations Recommendations for follow up therapy are one component of a multi-disciplinary discharge planning process, led by the attending physician.  Recommendations may be updated based on patient status, additional functional criteria and insurance authorization. Assessment: Orofacial Exam: Orofacial Exam Oral Cavity: Oral Hygiene: WFL Oral Cavity - Dentition: Dentures, not  available; Poor condition; Missing dentition Orofacial Anatomy: WFL Oral Motor/Sensory Function: Suspected cranial nerve impairment CN V - Trigeminal: WFL CN VII - Facial: Left motor impairment CN IX - Glossopharyngeal, CN X - Vagus: Left motor impairment CN XII - Hypoglossal: Left motor impairment Anatomy: Anatomy: Suspected cervical osteophytes; Prominent cricopharyngeus Boluses Administered: Boluses Administered Boluses Administered: Thin liquids (Level 0); Mildly thick liquids (Level 2, nectar thick); Moderately thick liquids (Level 3, honey thick); Puree  Oral Impairment Domain: Oral Impairment Domain Lip  Closure: Escape progressing to mid-chin Tongue control during bolus hold: Not tested Bolus transport/lingual motion: Slow tongue motion Oral residue: Residue collection on oral structures Location of oral residue : Tongue; Palate Initiation of pharyngeal swallow : Pyriform sinuses  Pharyngeal Impairment Domain: Pharyngeal Impairment Domain Soft palate elevation: No bolus between soft palate (SP)/pharyngeal wall (PW) Laryngeal elevation: Complete superior movement of thyroid  cartilage with complete approximation of arytenoids to epiglottic petiole Anterior hyoid excursion: Complete anterior movement Epiglottic movement: Partial inversion Laryngeal vestibule closure: Complete, no air/contrast in laryngeal vestibule Pharyngeal stripping wave : Present - complete Pharyngeal contraction (A/P view only): N/A Pharyngoesophageal segment opening: Partial distention/partial duration, partial obstruction of flow Tongue base retraction: Narrow column of contrast or air between tongue base and PPW Pharyngeal residue: Collection of residue within or on pharyngeal structures Location of pharyngeal residue: Tongue base; Valleculae; Pyriform sinuses  Esophageal Impairment Domain: No data recorded Pill: No data recorded Penetration/Aspiration Scale Score: Penetration/Aspiration Scale Score 1.  Material does not enter airway:  Puree 5.  Material enters airway, CONTACTS cords and not ejected out: Moderately thick liquids (Level 3, honey thick) 7.  Material enters airway, passes BELOW cords and not ejected out despite cough attempt by patient: Thin liquids (Level 0); Mildly thick liquids (Level 2, nectar thick) Compensatory Strategies: Compensatory Strategies Compensatory strategies: Yes Chin tuck: Ineffective Ineffective Chin Tuck: Thin liquid (Level 0)   General Information: Caregiver present: No  Diet Prior to this Study: NPO; Cortrak/Small bore NG tube   Temperature : Normal   Respiratory Status: WFL   Supplemental O2: None (Room air)   History of Recent Intubation: Yes  Behavior/Cognition: Alert; Cooperative; Requires cueing Self-Feeding Abilities: Needs assist with self-feeding Baseline vocal quality/speech: Dysphonic Volitional Cough: Able to elicit Volitional Swallow: Able to elicit Exam Limitations: No limitations Goal Planning: Prognosis for improved oropharyngeal function: Good Barriers to Reach Goals: Cognitive deficits; Severity of deficits No data recorded Patient/Family Stated Goal: wants to sleep Consulted and agree with results and recommendations: Patient; Nurse Pain: Pain Assessment Pain Assessment: Faces Faces Pain Scale: 2 Facial Expression: 0 Body Movements: 0 Muscle Tension: 0 Compliance with ventilator (intubated pts.): 0 Vocalization (extubated pts.): N/A CPOT Total: 0 Pain Location: legs Pain Descriptors / Indicators: Grimacing; Discomfort Pain Intervention(s): Monitored during session End of Session: Start Time:SLP Start Time (ACUTE ONLY): 1318 Stop Time: SLP Stop Time (ACUTE ONLY): 1340 Time Calculation:SLP Time Calculation (min) (ACUTE ONLY): 22 min Charges: SLP Evaluations $ SLP Speech Visit: 1 Visit SLP Evaluations $BSS Swallow: 1 Procedure $MBS Swallow: 1 Procedure $ SLP EVAL LANGUAGE/SOUND PRODUCTION: 1 Procedure SLP visit diagnosis: SLP Visit Diagnosis: Dysphagia, oropharyngeal phase (R13.12) Past Medical  History: Past Medical History: Diagnosis Date  Anxiety   Back pain   Cancer (HCC)   Depression   Emphysema   Esophageal spasm   Lung nodule   OSA (obstructive sleep apnea)   Osteomalacia   Personal history of radiation therapy   Vertigo   Vitamin D  deficiency  Past Surgical History: Past Surgical History: Procedure Laterality Date  ABDOMINAL HYSTERECTOMY    APPENDECTOMY    BREAST BIOPSY Right 08/31/2020  BREAST EXCISIONAL BIOPSY Left 1978  BREAST LUMPECTOMY Right 09/30/2020  BREAST LUMPECTOMY WITH RADIOACTIVE SEED LOCALIZATION Right 09/30/2020  Procedure: RIGHT BREAST LUMPECTOMY WITH RADIOACTIVE SEED LOCALIZATION;  Surgeon: Curvin Deward MOULD, MD;  Location: [facility];  Service: General;  Laterality: Right;  CARPAL TUNNEL RELEASE    IR CT HEAD LTD  06/25/2024  IR PERCUTANEOUS ART THROMBECTOMY/INFUSION INTRACRANIAL  INC DIAG ANGIO  06/25/2024  RADIOLOGY WITH ANESTHESIA N/A 06/25/2024  Procedure: RADIOLOGY WITH ANESTHESIA;  Surgeon: Dolphus Carrion, MD;  Location: [facility] OR;  Service: Radiology;  Laterality: N/A;  ROTATOR CUFF REPAIR   Damien Blumenthal, M.A., CCC-SLP Speech Language Pathology, Acute Rehabilitation Services Secure Chat preferred [phone] 06/27/2024, 2:32 PM  IR PERCUTANEOUS ART THROMBECTOMY/INFUSION INTRACRANIAL INC DIAG ANGIO Result Date: 06/27/2024 INDICATION: New onset right gaze deviation and left-sided weakness. Occluded right middle cerebral artery just distal to the origin of the non dominant inferior division. EXAM: 1. EMERGENT LARGE VESSEL OCCLUSION THROMBOLYSIS (anterior CIRCULATION) COMPARISON:  CT angiogram of the head and neck of June 25, 2024. MEDICATIONS: No antibiotic was administered within 1 hour of the procedure. ANESTHESIA/SEDATION: General anesthesia. CONTRAST:  Omnipaque  300 approximately 110 mL. FLUOROSCOPY TIME:  Fluoroscopy Time: 58 minutes 6 seconds (1989 mGy). COMPLICATIONS: None immediate. TECHNIQUE: Following a full explanation of the procedure along with the potential  associated complications, an informed witnessed consent was obtained. The risks of intracranial hemorrhage of 10%, worsening neurological deficit, ventilator dependency, death and inability to revascularize were all reviewed in detail with the patient's daughter. The patient was then put under general anesthesia by the Department of Anesthesiology at [facility] The right groin was prepped and draped in the usual sterile fashion. Thereafter using modified Seldinger technique, transfemoral access into the right common femoral artery was obtained without difficulty. Over an 0.035 inch guidewire an 8 French 25 cm pinnacle sheath was inserted. Through this, and also over an 0.035 inch guidewire a combination of an 088 cm Zoom support catheter with a 125 cm 6 French Simmons 2 catheter was advanced to the aortic arch region and positioned in the right common carotid artery and then the distal right internal carotid artery. FINDINGS: The right common carotid arteriogram demonstrates the right external carotid artery and its major branches to be widely patent. The right internal carotid artery at the bulb demonstrates a mild smooth irregularity. More distally, the vessel is seen to opacify to the cranial skull base with moderate to severe tortuosity of the mid cervical right ICA. The distal right ICA, the petrous, the cavernous and the supraclinoid ICA demonstrate wide patency. The right anterior cerebral artery opacifies into the capillary and venous phases. Prompt cross-filling via the anterior communicating artery of the left anterior cerebral artery A2 segment also noted. The right middle cerebral artery demonstrates complete occlusion just distal to the origin of the non dominant inferior division. The anterior temporal branch remains widely patent. Florid lenticular striate vasculature is noted. PROCEDURE: Through the Zoom support catheter in the petrous right ICA, a combination of an 068 aspiration catheter  with an 043 distal aspiration catheter was advanced over an 018 inch Aristotle micro guidewire with a moderate J configuration to the distal right middle cerebral artery. The guidewire was then advanced with moderate resistance followed by the 043 and then the 068 aspiration catheters. The 043 aspiration catheter was removed as was the micro guidewire. The 068 was engaged into the occluded dominant superior division proximally. Following 4 minutes of aspiration, the 068 aspiration catheter was removed. A control arteriogram performed through the Zoom support catheter demonstrated no change in the occluded dominant superior division. A second pass was made again using the above combination. The 068 aspiration catheter was advanced and imbedded in the dominant superior division proximally after removal of the 043 distal aspiration catheter. Following 3 minutes of aspiration, the 068 catheter was removed. A control arteriogram performed through  the Zoom support catheter again demonstrated no change in the occluded superior division. A third pass was then made into this time using an 021 162 cm microcatheter in combination with an 071 134 cm Zoom aspiration catheter advanced over an 014 inch standard Aristotle micro guidewire to the distal right M1 segment. The micro guidewire was then advanced with mild resistance into an inferior division branch followed by the microcatheter. The guidewire was removed. After having confirmed good aspiration from the microcatheter, a 4 mm x 40 mm Solitaire X retrieval device was then deployed in the usual manner. The 071 Zoom aspiration catheter was advanced and engaged into the occluded dominant superior division. Following 3 minutes of aspiration, the combination of the retrieval device, the microcatheter and the 071 Zoom aspiration catheter was removed. Control arteriogram performed through the Zoom support catheter demonstrated improved distal opacification of the dominant superior  division branch with a notable prominent filling defect in the more proximal part of the superior division representing an atherosclerotic plaque. A fourth pass was then made using an 055 Zoom aspiration catheter advanced with an 021 micro guidewire combination again over an 014 inch standard external micro guidewire. At this time, access was obtained in the more anterior branch of the superior division with the microcatheter. The 4 mm x 40 mm Solitaire X retrieval device was then deployed in the usual manner after having confirmed safe positioning of the tip of the microcatheter. The 055 Zoom aspiration catheter was then advanced more distally into the superior division. After 2 minutes of aspiration, the combination of the retrieval device and the 055 Zoom aspiration catheter was removed. A distal control arteriogram performed demonstrated opacification of attenuated distal branches of the superior division again with a more prominent smudgy filling defect proximally. A fifth pass was made using an 068 aspiration catheter advanced with an 021 micro guidewire over an 014 inch Aristotle micro guidewire with a moderate J configuration. Access into the M2 M3 region of the superior division was obtained followed by the microcatheter. The 068 aspiration catheter was advanced into the superior division. No contrast aspiration was obtained from the tip of the microcatheter which was then gradually retrieved until free aspiration was obtained. Following 2 minutes of aspiration through the aspiration catheter, this was removed. A control arteriogram performed through the Zoom support catheter demonstrated patent superior division with slow flow distally into the M2 M3 branches, which continued to remain attenuated as before. A flat panel CT of the brain was performed contemplating rescue stenting. This revealed a small right perisylvian subarachnoid hemorrhage. No gross mass effect was evident. Following discussion with the  neuro hospitalist, it was elected not to proceed with the rescue stenting given increase risk of intracranial hemorrhage with use of dual antiplatelets to protect the stent from occluding, and also with the patient having received TNK and the small caliber of the branches of the superior division proximally. The procedure was stopped. A final control arteriogram performed through the right common carotid artery continued to demonstrate patency of the right internal carotid artery proximally and distally with no change in the right MCA TICI 2B revascularization. Manual compression with quick clot was performed for hemostasis at the right groin puncture site. Distal pulses remained present unchanged from prior to the procedure. The patient was left intubated due to her medical condition. She was a transported in stable hemodynamic and neurological condition to the neuro ICU for post revascularization care. IMPRESSION: Status post endovascular revascularization of right middle  cerebral artery dominant superior division with 3 passes of contact aspiration, and 2 passes with a 4 mm x 40 mm Solitaire X retrieval device, and contact aspiration achieving a TICI 2B revascularization. PLAN: As per referring MD. Electronically Signed   By: Thyra Nash M.D.   On: 06/27/2024 08:29   IR CT Head Ltd Result Date: 06/27/2024 INDICATION: New onset right gaze deviation and left-sided weakness. Occluded right middle cerebral artery just distal to the origin of the non dominant inferior division. EXAM: 1. EMERGENT LARGE VESSEL OCCLUSION THROMBOLYSIS (anterior CIRCULATION) COMPARISON:  CT angiogram of the head and neck of June 25, 2024. MEDICATIONS: No antibiotic was administered within 1 hour of the procedure. ANESTHESIA/SEDATION: General anesthesia. CONTRAST:  Omnipaque  300 approximately 110 mL. FLUOROSCOPY TIME:  Fluoroscopy Time: 58 minutes 6 seconds (1989 mGy). COMPLICATIONS: None immediate. TECHNIQUE: Following a full  explanation of the procedure along with the potential associated complications, an informed witnessed consent was obtained. The risks of intracranial hemorrhage of 10%, worsening neurological deficit, ventilator dependency, death and inability to revascularize were all reviewed in detail with the patient's daughter. The patient was then put under general anesthesia by the Department of Anesthesiology at [facility]. The right groin was prepped and draped in the usual sterile fashion. Thereafter using modified Seldinger technique, transfemoral access into the right common femoral artery was obtained without difficulty. Over an 0.035 inch guidewire an 8 French 25 cm pinnacle sheath was inserted. Through this, and also over an 0.035 inch guidewire a combination of an 088 cm Zoom support catheter with a 125 cm 6 French Simmons 2 catheter was advanced to the aortic arch region and positioned in the right common carotid artery and then the distal right internal carotid artery. FINDINGS: The right common carotid arteriogram demonstrates the right external carotid artery and its major branches to be widely patent. The right internal carotid artery at the bulb demonstrates a mild smooth irregularity. More distally, the vessel is seen to opacify to the cranial skull base with moderate to severe tortuosity of the mid cervical right ICA. The distal right ICA, the petrous, the cavernous and the supraclinoid ICA demonstrate wide patency. The right anterior cerebral artery opacifies into the capillary and venous phases. Prompt cross-filling via the anterior communicating artery of the left anterior cerebral artery A2 segment also noted. The right middle cerebral artery demonstrates complete occlusion just distal to the origin of the non dominant inferior division. The anterior temporal branch remains widely patent. Florid lenticular striate vasculature is noted. PROCEDURE: Through the Zoom support catheter in the petrous  right ICA, a combination of an 068 aspiration catheter with an 043 distal aspiration catheter was advanced over an 018 inch Aristotle micro guidewire with a moderate J configuration to the distal right middle cerebral artery. The guidewire was then advanced with moderate resistance followed by the 043 and then the 068 aspiration catheters. The 043 aspiration catheter was removed as was the micro guidewire. The 068 was engaged into the occluded dominant superior division proximally. Following 4 minutes of aspiration, the 068 aspiration catheter was removed. A control arteriogram performed through the Zoom support catheter demonstrated no change in the occluded dominant superior division. A second pass was made again using the above combination. The 068 aspiration catheter was advanced and imbedded in the dominant superior division proximally after removal of the 043 distal aspiration catheter. Following 3 minutes of aspiration, the 068 catheter was removed. A control arteriogram performed through the Zoom support catheter again  demonstrated no change in the occluded superior division. A third pass was then made into this time using an 021 162 cm microcatheter in combination with an 071 134 cm Zoom aspiration catheter advanced over an 014 inch standard Aristotle micro guidewire to the distal right M1 segment. The micro guidewire was then advanced with mild resistance into an inferior division branch followed by the microcatheter. The guidewire was removed. After having confirmed good aspiration from the microcatheter, a 4 mm x 40 mm Solitaire X retrieval device was then deployed in the usual manner. The 071 Zoom aspiration catheter was advanced and engaged into the occluded dominant superior division. Following 3 minutes of aspiration, the combination of the retrieval device, the microcatheter and the 071 Zoom aspiration catheter was removed. Control arteriogram performed through the Zoom support catheter demonstrated  improved distal opacification of the dominant superior division branch with a notable prominent filling defect in the more proximal part of the superior division representing an atherosclerotic plaque. A fourth pass was then made using an 055 Zoom aspiration catheter advanced with an 021 micro guidewire combination again over an 014 inch standard external micro guidewire. At this time, access was obtained in the more anterior branch of the superior division with the microcatheter. The 4 mm x 40 mm Solitaire X retrieval device was then deployed in the usual manner after having confirmed safe positioning of the tip of the microcatheter. The 055 Zoom aspiration catheter was then advanced more distally into the superior division. After 2 minutes of aspiration, the combination of the retrieval device and the 055 Zoom aspiration catheter was removed. A distal control arteriogram performed demonstrated opacification of attenuated distal branches of the superior division again with a more prominent smudgy filling defect proximally. A fifth pass was made using an 068 aspiration catheter advanced with an 021 micro guidewire over an 014 inch Aristotle micro guidewire with a moderate J configuration. Access into the M2 M3 region of the superior division was obtained followed by the microcatheter. The 068 aspiration catheter was advanced into the superior division. No contrast aspiration was obtained from the tip of the microcatheter which was then gradually retrieved until free aspiration was obtained. Following 2 minutes of aspiration through the aspiration catheter, this was removed. A control arteriogram performed through the Zoom support catheter demonstrated patent superior division with slow flow distally into the M2 M3 branches, which continued to remain attenuated as before. A flat panel CT of the brain was performed contemplating rescue stenting. This revealed a small right perisylvian subarachnoid hemorrhage. No gross  mass effect was evident. Following discussion with the neuro hospitalist, it was elected not to proceed with the rescue stenting given increase risk of intracranial hemorrhage with use of dual antiplatelets to protect the stent from occluding, and also with the patient having received TNK and the small caliber of the branches of the superior division proximally. The procedure was stopped. A final control arteriogram performed through the right common carotid artery continued to demonstrate patency of the right internal carotid artery proximally and distally with no change in the right MCA TICI 2B revascularization. Manual compression with quick clot was performed for hemostasis at the right groin puncture site. Distal pulses remained present unchanged from prior to the procedure. The patient was left intubated due to her medical condition. She was a transported in stable hemodynamic and neurological condition to the neuro ICU for post revascularization care. IMPRESSION: Status post endovascular revascularization of right middle cerebral artery dominant superior division  with 3 passes of contact aspiration, and 2 passes with a 4 mm x 40 mm Solitaire X retrieval device, and contact aspiration achieving a TICI 2B revascularization. PLAN: As per referring MD. Electronically Signed   By: Thyra Nash M.D.   On: 06/27/2024 08:29   MR ANGIO HEAD WO CONTRAST Result Date: 06/27/2024 CLINICAL DATA:  Follow-up examination for acute stroke, status post revascularization. EXAM: MRI HEAD WITHOUT CONTRAST MRA HEAD WITHOUT CONTRAST TECHNIQUE: Multiplanar, multi-echo pulse sequences of the brain and surrounding structures were acquired without intravenous contrast. Angiographic images of the Circle of Willis were acquired using MRA technique without intravenous contrast. COMPARISON:  Comparison made with prior studies from 06/25/2024 FINDINGS: MRI HEAD FINDINGS Brain: Examination mildly degraded by motion artifact.  Generalized age-related cerebral atrophy. Patchy and confluent T2/FLAIR hyperintensity involving the periventricular deep white matter both cerebral hemispheres, consistent with chronic small vessel ischemic disease, moderately advanced in nature. Patchy and confluent restricted diffusion involving the right cerebral hemisphere, with involvement of the right frontal, parietal, and temporal lobes, consistent with an evolving right MCA territory infarct. Mild associated petechial blood products near the right frontoparietal region (series 29, image 32). Additionally, susceptibility artifact at the base of the sylvian fissure consistent with small volume acute subarachnoid hemorrhage (series 29, image 29). This was also seen on intra procedural head CT. No significant regional mass effect. No other evidence for acute or subacute ischemia. No other areas of chronic cortical infarction. No other acute or chronic intracranial blood products. No mass lesion or midline shift. No hydrocephalus or extra-axial fluid collection. Pituitary gland within normal limits. Vascular: Major intracranial vascular flow voids are maintained. Skull and upper cervical spine: Craniocervical junction within normal limits. Bone marrow signal intensity within normal limits. No scalp soft tissue abnormality. Sinuses/Orbits: Right gaze preference noted. Prior bilateral ocular lens replacement. Paranasal sinuses are largely clear. Trace bilateral mastoid effusions noted of doubtful significance. Other: None. MRA HEAD FINDINGS Anterior circulation: Examination mildly degraded by motion artifact. Both internal carotid arteries remain patent through the siphons without stenosis or other abnormality. A1 segments, anterior communicating artery complex, and anterior cerebral arteries patent without stenosis. Left M1 segment and distal left MCA branches are widely patent and well perfused. Right M1 segment widely patent proximally, but becomes increasing  attenuated as it courses towards the bifurcation. There is persistent and/or recurrent proximal right M2 occlusion, superior division (series 8, image 125). Overall appearance is similar to prior CTA. Remainder of the right MCA branches are somewhat attenuated but remain patent. Distal small vessel atheromatous irregularity noted. Posterior circulation: Right vertebral artery dominant and widely patent to the vertebrobasilar junction. Left vertebral artery hypoplastic and patent without visible stenosis. Partially visualized PICA are grossly patent. Basilar patent without stenosis. Superior cerebellar and posterior cerebral arteries patent bilaterally. Distal small vessel atheromatous irregularity noted. Anatomic variants: As above.  No visible aneurysm. IMPRESSION: MRI HEAD: 1. Evolving right MCA territory infarct. Mild associated petechial blood products at the right frontoparietal region. No significant regional mass effect. 2. Small volume acute subarachnoid hemorrhage at the base of the right Sylvian fissure. This was also seen on intra procedural head CT from 06/25/2024. 3. Underlying age-related cerebral atrophy with moderate chronic microvascular ischemic disease. MRA HEAD: 1. Persistent and/or recurrent proximal right M2 occlusion, superior division. Overall appearance is similar to prior CTA. 2. Otherwise wide patency of the intracranial arterial circulation. No other hemodynamically significant or correctable stenosis. Electronically Signed   By: Morene Hoard M.D.   On:  06/27/2024 02:52   MR BRAIN WO CONTRAST Result Date: 06/27/2024 CLINICAL DATA:  Follow-up examination for acute stroke, status post revascularization. EXAM: MRI HEAD WITHOUT CONTRAST MRA HEAD WITHOUT CONTRAST TECHNIQUE: Multiplanar, multi-echo pulse sequences of the brain and surrounding structures were acquired without intravenous contrast. Angiographic images of the Circle of Willis were acquired using MRA technique without  intravenous contrast. COMPARISON:  Comparison made with prior studies from 06/25/2024 FINDINGS: MRI HEAD FINDINGS Brain: Examination mildly degraded by motion artifact. Generalized age-related cerebral atrophy. Patchy and confluent T2/FLAIR hyperintensity involving the periventricular deep white matter both cerebral hemispheres, consistent with chronic small vessel ischemic disease, moderately advanced in nature. Patchy and confluent restricted diffusion involving the right cerebral hemisphere, with involvement of the right frontal, parietal, and temporal lobes, consistent with an evolving right MCA territory infarct. Mild associated petechial blood products near the right frontoparietal region (series 29, image 32). Additionally, susceptibility artifact at the base of the sylvian fissure consistent with small volume acute subarachnoid hemorrhage (series 29, image 29). This was also seen on intra procedural head CT. No significant regional mass effect. No other evidence for acute or subacute ischemia. No other areas of chronic cortical infarction. No other acute or chronic intracranial blood products. No mass lesion or midline shift. No hydrocephalus or extra-axial fluid collection. Pituitary gland within normal limits. Vascular: Major intracranial vascular flow voids are maintained. Skull and upper cervical spine: Craniocervical junction within normal limits. Bone marrow signal intensity within normal limits. No scalp soft tissue abnormality. Sinuses/Orbits: Right gaze preference noted. Prior bilateral ocular lens replacement. Paranasal sinuses are largely clear. Trace bilateral mastoid effusions noted of doubtful significance. Other: None. MRA HEAD FINDINGS Anterior circulation: Examination mildly degraded by motion artifact. Both internal carotid arteries remain patent through the siphons without stenosis or other abnormality. A1 segments, anterior communicating artery complex, and anterior cerebral arteries  patent without stenosis. Left M1 segment and distal left MCA branches are widely patent and well perfused. Right M1 segment widely patent proximally, but becomes increasing attenuated as it courses towards the bifurcation. There is persistent and/or recurrent proximal right M2 occlusion, superior division (series 8, image 125). Overall appearance is similar to prior CTA. Remainder of the right MCA branches are somewhat attenuated but remain patent. Distal small vessel atheromatous irregularity noted. Posterior circulation: Right vertebral artery dominant and widely patent to the vertebrobasilar junction. Left vertebral artery hypoplastic and patent without visible stenosis. Partially visualized PICA are grossly patent. Basilar patent without stenosis. Superior cerebellar and posterior cerebral arteries patent bilaterally. Distal small vessel atheromatous irregularity noted. Anatomic variants: As above.  No visible aneurysm. IMPRESSION: MRI HEAD: 1. Evolving right MCA territory infarct. Mild associated petechial blood products at the right frontoparietal region. No significant regional mass effect. 2. Small volume acute subarachnoid hemorrhage at the base of the right Sylvian fissure. This was also seen on intra procedural head CT from 06/25/2024. 3. Underlying age-related cerebral atrophy with moderate chronic microvascular ischemic disease. MRA HEAD: 1. Persistent and/or recurrent proximal right M2 occlusion, superior division. Overall appearance is similar to prior CTA. 2. Otherwise wide patency of the intracranial arterial circulation. No other hemodynamically significant or correctable stenosis. Electronically Signed   By: Morene Hoard M.D.   On: 06/27/2024 02:52   Portable Chest x-ray Result Date: 06/25/2024 CLINICAL DATA:  Intubated EXAM: PORTABLE CHEST 1 VIEW COMPARISON:  06/23/2024, 03/16/2020 FINDINGS: Endotracheal tube tip is about 2.4 cm superior to the carina. Enteric tube tip below the  diaphragm but incompletely assessed. Minimal  patchy atelectasis or scarring left base. Stable cardiomediastinal silhouette with aortic atherosclerosis. No pneumothorax IMPRESSION: Endotracheal tube tip about 2.4 cm superior to the carina. Minimal patchy atelectasis or scarring at the left base. Electronically Signed   By: Luke Bun M.D.   On: 06/25/2024 23:38   DG Abd 1 View Result Date: 06/25/2024 CLINICAL DATA:  Feeding tube placement EXAM: ABDOMEN - 1 VIEW COMPARISON:  03/20/2024 FINDINGS: Enteric tube tip and side port overlie the proximal stomach. Aortic atherosclerosis. Contrast within the renal collecting systems. IMPRESSION: Enteric tube tip and side port overlie the proximal stomach. Electronically Signed   By: Luke Bun M.D.   On: 06/25/2024 23:37   CT ANGIO HEAD NECK W WO CM (CODE STROKE) Result Date: 06/25/2024 CLINICAL DATA:  Neuro deficit, acute, stroke suspected EXAM: CT ANGIOGRAPHY HEAD AND NECK WITH AND WITHOUT CONTRAST TECHNIQUE: Multidetector CT imaging of the head and neck was performed using the standard protocol during bolus administration of intravenous contrast. Multiplanar CT image reconstructions and MIPs were obtained to evaluate the vascular anatomy. Carotid stenosis measurements (when applicable) are obtained utilizing NASCET criteria, using the distal internal carotid diameter as the denominator. RADIATION DOSE REDUCTION: This exam was performed according to the departmental dose-optimization program which includes automated exposure control, adjustment of the mA and/or kV according to patient size and/or use of iterative reconstruction technique. CONTRAST:  75mL OMNIPAQUE  IOHEXOL  350 MG/ML SOLN COMPARISON:  CT of the head dated June 25, 2024. FINDINGS: CTA NECK FINDINGS Aortic arch: Mild to moderate calcific plaque within the aortic arch. The brachiocephalic artery peers widely patent. Right carotid system: The common carotid and internal carotid arteries are normal in  caliber and demonstrates mild calcific atheromatous disease. The internal carotid artery is tortuous. Left carotid system: The common carotid artery is normal in caliber and demonstrates mild calcific atheromatous disease. There is mild calcific plaque present proximally within the internal carotid artery, but no luminal stenosis. Vertebral arteries: The right vertebral artery is dominant and the left is relatively hypoplastic. The V1 segment is tortuous. Skeleton: Multilevel degenerative disc disease throughout the cervical spine. No osseous lesions. Other neck: Negative. Upper chest: Mild-to-moderate central lobular emphysema. Review of the MIP images confirms the above findings CTA HEAD FINDINGS Anterior circulation: There is an abrupt cut off of the distal M1 segment of the right middle cerebral artery with limited filling of the M2 segments. The anterior cerebral arteries and the left middle cerebral artery are normal in caliber and demonstrate no evidence of aneurysm or flow-limiting stenosis. There is mild calcific plaque within the carotid siphons, but no flow limiting stenosis. Posterior circulation: The vertebrobasilar system is unremarkable. The posterior cerebral arteries and the cerebellar arteries are patent. The right vertebral artery is dominant. Venous sinuses: Patent. Anatomic variants: None. Review of the MIP images confirms the above findings IMPRESSION: 1. Abrupt occlusion of the distal M1 segment of the right middle cerebral artery. These results were called by telephone at the time of interpretation on 06/25/2024 at 7:21 pm to provider Dr. LOLA JERNIGAN , who verbally acknowledged these results. Electronically Signed   By: Evalene Coho M.D.   On: 06/25/2024 19:32   CT HEAD CODE STROKE WO CONTRAST Result Date: 06/25/2024 CLINICAL DATA:  Code stroke.  Neuro deficit, concern for stroke. EXAM: CT HEAD WITHOUT CONTRAST TECHNIQUE: Contiguous axial images were obtained from the base of the  skull through the vertex without intravenous contrast. RADIATION DOSE REDUCTION: This exam was performed according to the departmental dose-optimization  program which includes automated exposure control, adjustment of the mA and/or kV according to patient size and/or use of iterative reconstruction technique. COMPARISON:  MRI head 05/06/2022. FINDINGS: Brain: No acute intracranial hemorrhage. No CT evidence of acute infarct. Nonspecific hypoattenuation in the periventricular and subcortical white matter favored to reflect chronic microvascular ischemic changes. no edema, mass effect, or midline shift. The basilar cisterns are patent. Ventricles: The ventricles are normal. Vascular: No hyperdense vessel or unexpected calcification. Skull: No acute or aggressive finding. Orbits: Bilateral lens replacement. Sinuses: The visualized paranasal sinuses are clear. Other: Mastoid air cells are clear. ASPECTS [facility] Stroke Program Early CT Score) - Ganglionic level infarction (caudate, lentiform nuclei, internal capsule, insula, M1-M3 cortex): 7 - Supraganglionic infarction (M4-M6 cortex): 3 Total score (0-10 with 10 being normal): 10 IMPRESSION: 1. No CT evidence of acute intracranial abnormality. 2. ASPECTS is 10 These results were communicated to Dr. Jerrie at 6:58 pm on 06/25/2024 by text page via the [facility] messaging system. Electronically Signed   By: Donnice Mania M.D.   On: 06/25/2024 18:58   ECHOCARDIOGRAM COMPLETE Result Date: 06/25/2024    ECHOCARDIOGRAM REPORT   Patient Name:   VONCEIL UPSHUR Date of Exam: 06/25/2024 Medical Rec #:  [id number]       Height:       61.0 in Accession #:    [id number]      Weight:       149.7 lb Date of Birth:  1932/07/19       BSA:          1.670 m Patient Age:    89 years        BP:           118/47 mmHg Patient Gender: F               HR:           71 bpm. Exam Location:  Inpatient Procedure: 2D Echo, Cardiac Doppler and Color Doppler (Both Spectral and Color            Flow Doppler  were utilized during procedure). Indications:    CHF-Acute Diastolic I50.31  History:        Patient has prior history of Echocardiogram examinations, most                 recent 06/09/2022. COPD; Risk Factors:Dyslipidemia and Current                 Smoker. Breast cancer (R) breast.  Sonographer:    Thea Norlander RCS Referring Phys: [id number] VASUNDHRA RATHORE  Sonographer Comments: Image acquisition challenging due to patient body habitus. IMPRESSIONS  1. Left ventricular ejection fraction, by estimation, is 60 to 65%. The left ventricle has normal function. The left ventricle has no regional wall motion abnormalities. Left ventricular diastolic parameters are consistent with Grade II diastolic dysfunction (pseudonormalization).  2. Right ventricular systolic function is normal. The right ventricular size is normal.  3. Left atrial size was mildly dilated.  4. The mitral valve is normal in structure. No evidence of mitral valve regurgitation. No evidence of mitral stenosis.  5. The aortic valve is tricuspid. Aortic valve regurgitation is not visualized. No aortic stenosis is present.  6. The inferior vena cava is normal in size with greater than 50% respiratory variability, suggesting right atrial pressure of 3 mmHg. FINDINGS  Left Ventricle: Left ventricular ejection fraction, by estimation, is 60 to 65%. The left ventricle has normal function. The left ventricle  has no regional wall motion abnormalities. The left ventricular internal cavity size was normal in size. There is  no left ventricular hypertrophy. Left ventricular diastolic parameters are consistent with Grade II diastolic dysfunction (pseudonormalization). Right Ventricle: The right ventricular size is normal. Right ventricular systolic function is normal. Left Atrium: Left atrial size was mildly dilated. Right Atrium: Right atrial size was normal in size. Pericardium: There is no evidence of pericardial effusion. Mitral Valve: The mitral valve is  normal in structure. Mild mitral annular calcification. No evidence of mitral valve regurgitation. No evidence of mitral valve stenosis. Tricuspid Valve: The tricuspid valve is normal in structure. Tricuspid valve regurgitation is trivial. No evidence of tricuspid stenosis. Aortic Valve: The aortic valve is tricuspid. Aortic valve regurgitation is not visualized. No aortic stenosis is present. Aortic valve peak gradient measures 3.2 mmHg. Pulmonic Valve: The pulmonic valve was normal in structure. Pulmonic valve regurgitation is mild. No evidence of pulmonic stenosis. Aorta: The aortic root is normal in size and structure. Venous: The inferior vena cava is normal in size with greater than 50% respiratory variability, suggesting right atrial pressure of 3 mmHg. IAS/Shunts: No atrial level shunt detected by color flow Doppler.  LEFT VENTRICLE PLAX 2D LVIDd:         3.70 cm   Diastology LVIDs:         2.40 cm   LV e' medial:    5.87 cm/s LV PW:         1.10 cm   LV E/e' medial:  12.9 LV IVS:        0.90 cm   LV e' lateral:   7.83 cm/s LVOT diam:     2.10 cm   LV E/e' lateral: 9.6 LV SV:         46 LV SV Index:   28 LVOT Area:     3.46 cm  RIGHT VENTRICLE            IVC RV S prime:     7.54 cm/s  IVC diam: 1.40 cm LEFT ATRIUM           Index        RIGHT ATRIUM           Index LA diam:      3.10 cm 1.86 cm/m   RA Area:     13.20 cm LA Vol (A4C): 43.4 ml 25.96 ml/m  RA Volume:   25.90 ml  15.51 ml/m  AORTIC VALVE AV Area (Vmax): 2.63 cm AV Vmax:        88.90 cm/s AV Peak Grad:   3.2 mmHg LVOT Vmax:      67.60 cm/s LVOT Vmean:     40.400 cm/s LVOT VTI:       0.133 m  AORTA Ao Root diam: 3.60 cm Ao Asc diam:  3.30 cm MITRAL VALVE MV Area (PHT): 3.37 cm    SHUNTS MV Decel Time: 225 msec    Systemic VTI:  0.13 m MV E velocity: 75.50 cm/s  Systemic Diam: 2.10 cm MV A velocity: 61.30 cm/s MV E/A ratio:  1.23 Redell Shallow MD Electronically signed by Redell Shallow MD Signature Date/Time: 06/25/2024/4:59:34 PM    Final     NM Hepatobiliary Liver Func Result Date: 06/24/2024 CLINICAL DATA:  Cholecystitis. EXAM: NUCLEAR MEDICINE HEPATOBILIARY IMAGING TECHNIQUE: Sequential images of the abdomen were obtained out to 60 minutes following intravenous administration of radiopharmaceutical. RADIOPHARMACEUTICALS:  5 mCi Tc-27m  Choletec  IV 2.7 mg morphine  also administered  IV. COMPARISON:  MRI 06/22/2024 FINDINGS: Satisfactory uptake of radiopharmaceutical from the blood pool. Biliary activity visible at 8 minutes. Bowel activity visible at 14 minutes. Initial non filling of the gallbladder over 60 minutes. We administered the 2.7 mg of morphine  IV to cause contraction of the sphincter of OD. Subsequently the gallbladder opacifies, first becoming visible at 19 minutes post morphine  administration and subsequently demonstrating increasing activity until the 30 minute mark. Accordingly the cystic duct and common bile duct are both patent. IMPRESSION: 1. Patent cystic duct, accordingly no supportive evidence for acute cholecystitis. 2. Patent common bile duct 3. Overall normal exam. Electronically Signed   By: Ryan Salvage M.D.   On: 06/24/2024 13:57   DG CHEST PORT 1 VIEW Result Date: 06/23/2024 CLINICAL DATA:  Cough EXAM: PORTABLE CHEST 1 VIEW COMPARISON:  Chest radiograph February 18, 2024 FINDINGS: The heart size and mediastinal contours are within normal limits. Increased interstitial markings of both lung fields without significant consolidation or suspicious pulmonary nodule, increased to prior. Atherosclerotic calcifications of aortic knob and descending aorta. Chronic left rib fractures with mild displacement. No pleural effusion or pneumothorax. IMPRESSION: Diffuse increased interstitial markings of both lung fields without focal consolidation or suspicious pulmonary nodules which can be seen the setting of infection/inflammation, or early pulmonary edema. Correlate with clinical findings. . Electronically Signed   By: Megan   Zare M.D.   On: 06/23/2024 17:15   MR 3D Recon At Scanner Result Date: 06/22/2024 CLINICAL DATA:  Elevated liver function tests. EXAM: MRI ABDOMEN WITHOUT AND WITH CONTRAST (INCLUDING MRCP) TECHNIQUE: Multiplanar multisequence MR imaging of the abdomen was performed both before and after the administration of intravenous contrast. Heavily T2-weighted images of the biliary and pancreatic ducts were obtained, and three-dimensional MRCP images were rendered by post processing. CONTRAST:  7mL GADAVIST  GADOBUTROL  1 MMOL/ML IV SOLN COMPARISON:  CT 06/21/2024 FINDINGS: Exam is degraded patient respiratory motion. Patient has difficulty falling breath hold commands. Lower chest:  Lung bases are clear. Hepatobiliary: No intrahepatic biliary duct dilatation. No hepatic steatosis. Normal liver parenchyma. Within the lateral segment LEFT hepatic lobe 13 mm lesion is hyperintense on T2 weighted imaging corresponds to lesion of concern on comparison CT. There is motion degradation of the postcontrast T1 weighted imaging however lesion appears to have peripheral enhancement which is typical of benign hemangiomas. MRCP sequences are degraded by patient respiratory motion. No gallstones are evident. No biliary duct dilatation evident. No choledocholithiasis. Common bile duct measures 5 mm (image 21/series 3). Pancreas: Normal pancreatic parenchymal intensity. No ductal dilatation or inflammation. Spleen: Normal spleen. Adrenals/urinary tract: Adrenal glands and kidneys are normal. Stomach/Bowel: Stomach and limited of the small bowel is unremarkable Vascular/Lymphatic: Abdominal aortic normal caliber. No retroperitoneal periportal lymphadenopathy. Musculoskeletal: No aggressive osseous lesion IMPRESSION: 1. Certain sequences are severely degraded by patient respiratory motion. 2. No evidence acute cholecystitis. 3. No choledocholithiasis. 4. Normal common hepatic duct and common bile duct. No evidence of biliary obstruction. 5. No  hepatic steatosis. 6. Probable benign hemangioma in the RIGHT hepatic lobe. Electronically Signed   By: Jackquline Boxer M.D.   On: 06/22/2024 16:06   MR ABDOMEN MRCP W WO CONTAST Result Date: 06/22/2024 CLINICAL DATA:  Elevated liver function tests. EXAM: MRI ABDOMEN WITHOUT AND WITH CONTRAST (INCLUDING MRCP) TECHNIQUE: Multiplanar multisequence MR imaging of the abdomen was performed both before and after the administration of intravenous contrast. Heavily T2-weighted images of the biliary and pancreatic ducts were obtained, and three-dimensional MRCP images were rendered by post processing.  CONTRAST:  7mL GADAVIST  GADOBUTROL  1 MMOL/ML IV SOLN COMPARISON:  CT 06/21/2024 FINDINGS: Exam is degraded patient respiratory motion. Patient has difficulty falling breath hold commands. Lower chest:  Lung bases are clear. Hepatobiliary: No intrahepatic biliary duct dilatation. No hepatic steatosis. Normal liver parenchyma. Within the lateral segment LEFT hepatic lobe 13 mm lesion is hyperintense on T2 weighted imaging corresponds to lesion of concern on comparison CT. There is motion degradation of the postcontrast T1 weighted imaging however lesion appears to have peripheral enhancement which is typical of benign hemangiomas. MRCP sequences are degraded by patient respiratory motion. No gallstones are evident. No biliary duct dilatation evident. No choledocholithiasis. Common bile duct measures 5 mm (image 21/series 3). Pancreas: Normal pancreatic parenchymal intensity. No ductal dilatation or inflammation. Spleen: Normal spleen. Adrenals/urinary tract: Adrenal glands and kidneys are normal. Stomach/Bowel: Stomach and limited of the small bowel is unremarkable Vascular/Lymphatic: Abdominal aortic normal caliber. No retroperitoneal periportal lymphadenopathy. Musculoskeletal: No aggressive osseous lesion IMPRESSION: 1. Certain sequences are severely degraded by patient respiratory motion. 2. No evidence acute  cholecystitis. 3. No choledocholithiasis. 4. Normal common hepatic duct and common bile duct. No evidence of biliary obstruction. 5. No hepatic steatosis. 6. Probable benign hemangioma in the RIGHT hepatic lobe. Electronically Signed   By: Jackquline Boxer M.D.   On: 06/22/2024 16:06   US  Abdomen Limited RUQ (LIVER/GB) Result Date: 06/21/2024 CLINICAL DATA:  Upper abdominal pain EXAM: ULTRASOUND ABDOMEN LIMITED RIGHT UPPER QUADRANT COMPARISON:  CT 06/21/2024 FINDINGS: Gallbladder: No shadowing stones. Gallbladder distension. Focal wall thickening measuring up to 8 mm at the fundus. Negative sonographic Murphy. Common bile duct: Diameter: 7.1 mm Liver: Echogenic nodule left hepatic lobe measuring 14 x 14 x 15 mm likely corresponding to hypodensity on CT. Portal vein is patent on color Doppler imaging with normal direction of blood flow towards the liver. Other: None. IMPRESSION: 1. Gallbladder distension with focal echogenic wall thickening at the fundus. No shadowing stones. Negative sonographic Murphy. Findings are nonspecific and could be due to focal gallbladder wall inflammatory process. Gallbladder wall thickening secondary to neoplasm is possible but no masslike features on previously performed CT. 2. Common bile duct upper normal at 7.1 mm. Correlate with LFTs. 3. 15 mm echogenic nodule in the left hepatic lobe likely corresponding to hypodensity on CT, possible hemangioma. This could be confirmed with nonemergent MRI if desired given patient age. Electronically Signed   By: Luke Bun M.D.   On: 06/21/2024 21:13   CT ABDOMEN PELVIS W CONTRAST Result Date: 06/21/2024 CLINICAL DATA:  Abdomen pain EXAM: CT ABDOMEN AND PELVIS WITH CONTRAST TECHNIQUE: Multidetector CT imaging of the abdomen and pelvis was performed using the standard protocol following bolus administration of intravenous contrast. RADIATION DOSE REDUCTION: This exam was performed according to the departmental dose-optimization program  which includes automated exposure control, adjustment of the mA and/or kV according to patient size and/or use of iterative reconstruction technique. CONTRAST:  75mL OMNIPAQUE  IOHEXOL  350 MG/ML SOLN COMPARISON:  CT 03/14/2020 FINDINGS: Lower chest: Lung bases demonstrate mild subpleural scarring. No acute airspace disease. Pectus deformity of the lower anterior chest wall. Hepatobiliary: Distended gallbladder. No calcified stones. Possible trace pericholecystic fluid versus gallbladder wall thickening on coronal images. No significant biliary dilatation. Indeterminate hypodensity in the left hepatic lobe measuring about 13 mm on series 3, image 21. Pancreas: Atrophic.  No acute inflammation Spleen: Normal in size without focal abnormality. Adrenals/Urinary Tract: Adrenal glands are normal. Kidneys show slight prominence of collecting systems but no hydronephrosis or  obstructing stone. Bladder is unremarkable Stomach/Bowel: The stomach is nonenlarged. No dilated small bowel. Diverticular disease of the left colon. No acute wall thickening Vascular/Lymphatic: Aortic atherosclerosis. No enlarged abdominal or pelvic lymph nodes. Reproductive: Status post hysterectomy. No adnexal masses. Other: Negative for pelvic effusion or free air. Musculoskeletal: No acute or suspicious osseous abnormality. Multilevel degenerative change IMPRESSION: 1. Distended gallbladder with possible trace pericholecystic fluid versus gallbladder wall thickening. Suggest correlation with right upper quadrant ultrasound. 2. Diverticular disease of the left colon without acute inflammatory process. 3. Indeterminate 13 mm hypodensity in the left hepatic lobe. When the patient is clinically stable and able to follow directions and hold their breath (preferably as an outpatient) further evaluation with dedicated abdominal MRI should be considered if deemed clinically appropriate 4. Aortic atherosclerosis. Aortic Atherosclerosis (ICD10-I70.0).  Electronically Signed   By: Luke Bun M.D.   On: 06/21/2024 17:23    Labs:  CBC: Recent Labs    07/07/24 0450 07/08/24 1040 07/09/24 0431 07/10/24 0456  WBC 6.9 6.9 7.1 8.4  HGB 10.3* 11.2* 11.3* 11.4*  HCT 31.7* 34.5* 33.9* 33.3*  PLT 275 286 237 229    COAGS: No results for input(s): INR, APTT in the last 8760 hours.  BMP: Recent Labs    07/07/24 0450 07/08/24 1040 07/09/24 0431 07/10/24 0456  NA 136 138 137 135  K 4.7 4.6 4.0 4.0  CL 99 99 101 98  CO2 25 27 26 26   GLUCOSE 118* 127* 117* 121*  BUN 26* 22 23 25*  CALCIUM  8.9 9.4 9.4 9.4  CREATININE 0.66 0.71 0.68 0.68  GFRNONAA >60 >60 >60 >60    LIVER FUNCTION TESTS: Recent Labs    06/27/24 0530 07/03/24 0404 07/05/24 0423 07/09/24 0431  BILITOT 0.8 0.6 0.6 0.6  AST 37 25 31 25   ALT 60* 39 33 24  ALKPHOS 62 61 65 79  PROT 5.5* 5.4* 5.6* 5.9*  ALBUMIN 2.7* 2.5* 2.6* 2.9*    TUMOR MARKERS: No results for input(s): AFPTM, CEA, CA199, CHROMGRNA in the last 8760 hours.  Assessment and Plan:  Request for  image guided PEG tube placement on Monday AM by Dr. Jenna, approved by Dr. Jenna with patient remaining on ASA d/t recent CVA.  No contraindications for procedure identified in ROS, physical exam, or review of pre-sedation considerations. Pending INR and CBC morning of procedure. WBC wnl today.  7/5 CT abd imaging available and reviewed. Will need oral contrast night before procedure.  VSS, afebrile Lovenox  to be held Sunday PM Abx - cefazolin  2g prophylactic Hold TF when contrast given night prior  Risks and benefits image guided gastrostomy tube placement was discussed with the patient including, but not limited to the need for a barium enema during the procedure, bleeding, infection, peritonitis and/or damage to adjacent structures. Increased bleeding risk on ASA discussed with family who accepts this risk.   All of the patient's questions were answered, patient is agreeable to  proceed.  Consent signed and in chart.   Thank you for allowing our service to participate in EMMANUELLE HIBBITTS 's care.    Electronically Signed: Laymon Coast, NP   07/11/2024, 11:48 AM     I spent a total of 20 Minutes    in face to face in clinical consultation, greater than 50% of which was counseling/coordinating care for image guided peg tube placement.   (A copy of this note was sent to the referring provider and the time of visit.)

## 2024-07-11 NOTE — Progress Notes (Signed)
 Speech Language Pathology Daily Session Note  Patient Details  Name: [name] MRN: [id number] Date of Birth: [birth year]  Today's Date: 07/11/2024 SLP Individual Time: 1250-1334 SLP Individual Time Calculation (min): 44 min  Short Term Goals: Week 1: SLP Short Term Goal 1 (Week 1): Pt will trial ice chips/ water  with SLP only in order to assess potential for initiation of [place] water  protocol SLP Short Term Goal 2 (Week 1): Patient will participate in formal cognitive testing in order to determine current cognitive function. SLP Short Term Goal 3 (Week 1): Patient will recall daily events with 80% accuracy using internal/external memory aids given min assist. SLP Short Term Goal 4 (Week 1): Patient will complete pharyngeal strengthening exercises as appropriate given mod A in order to improve swallow function  Skilled Therapeutic Interventions: Skilled therapy session focused on cognition and dysphagia goals. SLP targeted cognitive goals through orientation and memory task. Patient oriented to self, age, location, situation and month/year independently. She required modA to utilize calendar for day of the week and date. SLP targeted memory goals by prompting patient to recall activities completed this morning with OT. Patient recalled transfer to chair and family visiting independently. SLP targeted dysphagia goals through prompting patient to complete expiratory muscle strength training and oropharyngeal strengthening exercises. Patient completed x25 repetitions of EMST at 15cm H2O and x20 chin tucks against resistance with minA. SLP then provided set up A for oral care and offered ice chips. Patient with timely swallow initiation, though with consistent s/sx of aspiration. Recommend continuation of NPO w/ alternative means of nutrition, hydration and medication administration. Patient left in chair with alarm set and call bell in reach. Continue POC.  Pain Shoulder pain- warm compress  added  Therapy/Group: Individual Therapy  Camil Wilhelmsen M.A., CCC-SLP 07/11/2024, 7:42 AM

## 2024-07-11 NOTE — Progress Notes (Addendum)
 PROGRESS NOTE   Subjective/Complaints: Back pain is much improved Much more alert today Patient's chart reviewed- No issues reported overnight Hypotensive  ROS: back pain improved, +muscle spasms, improved sleep   Objective:   No results found.  Recent Labs    07/09/24 0431 07/10/24 0456  WBC 7.1 8.4  HGB 11.3* 11.4*  HCT 33.9* 33.3*  PLT 237 229   Recent Labs    07/09/24 0431 07/10/24 0456  NA 137 135  K 4.0 4.0  CL 101 98  CO2 26 26  GLUCOSE 117* 121*  BUN 23 25*  CREATININE 0.68 0.68  CALCIUM  9.4 9.4    Intake/Output Summary (Last 24 hours) at 07/11/2024 1005 Last data filed at 07/11/2024 0742 Gross per 24 hour  Intake 600 ml  Output --  Net 600 ml        Physical Exam: Vital Signs Blood pressure (!) 114/48, pulse 65, temperature 97.7 F (36.5 C), resp. rate 17, height 5' 1 (1.549 m), weight 68.2 kg, SpO2 96%. Gen: no distress, normal appearing HENT:     Head: Normocephalic and atraumatic.     Comments: Mild L facial droop Tongue midline  Mild to moderate facial redness- on R cheek    Right Ear: External ear normal.     Left Ear: External ear normal.     Nose: Nose normal. No congestion.     Comments: Cortrak- in nare    Mouth/Throat:     Mouth: Mucous membranes are dry.  Eyes:     Comments: Couldn't complete EOM's for me- but so sleepy  Cardiovascular:     Rate and Rhythm: Bradycardia    Heart sounds: Normal heart sounds. No murmur heard.    No gallop.  Pulmonary:     Effort: Pulmonary effort is normal. No respiratory distress.     Breath sounds: Normal breath sounds. No wheezing, rhonchi or rales.  Abdominal:     General: Bowel sounds are normal. There is no distension.     Palpations: Abdomen is soft.     Tenderness: There is no abdominal tenderness.  Musculoskeletal:     Cervical back: Neck supple.     Comments:  RUE 5-/5 LUE 0/5 throughout RLE- 4+/5 throughout- hard to get  pt to do exam LLE- PF 1/5- otherwise 0/5 in LLE, stable 7/25 Skin:    General: Skin is warm and dry.     Comments: R forearm IV Some UE bruising  A lot of moles vs neurofibromas? On skin with actinic keratosis on multiple spots  Neurological:     Mental Status: She is alert.     Comments: Moderate dysarthria with occasional wet voice. Left facial weakness with left inattention. Left HH and needs cues to look to left. Able to smaller numbers on the clock. She was able to answer basic orientation questions, state age, DOB and PCP name.  Left hemiparesis with sensory deficits.  No clonus, no hoffman's Strong (+) Babinski Flaccid on L side  Psychiatric:     Comments: Alert and smiling   Assessment/Plan: 1. Functional deficits which require 3+ hours per day of interdisciplinary therapy in a comprehensive inpatient rehab setting. Physiatrist is providing  close team supervision and 24 hour management of active medical problems listed below. Physiatrist and rehab team continue to assess barriers to discharge/monitor patient progress toward functional and medical goals  Care Tool:  Bathing    Body parts bathed by patient: Face   Body parts bathed by helper: Right arm, Left arm, Chest, Abdomen, Front perineal area, Buttocks, Left upper leg, Right lower leg, Left lower leg, Right upper leg     Bathing assist Assist Level: Total Assistance - Patient < 25%     Upper Body Dressing/Undressing Upper body dressing   What is the patient wearing?: Pull over shirt    Upper body assist Assist Level: Maximal Assistance - Patient 25 - 49%    Lower Body Dressing/Undressing Lower body dressing      What is the patient wearing?: Pants, Incontinence brief     Lower body assist Assist for lower body dressing: Total Assistance - Patient < 25%     Toileting Toileting    Toileting assist Assist for toileting: Dependent - Patient 0%     Transfers Chair/bed transfer  Transfers assist   Chair/bed transfer activity did not occur: Safety/medical concerns (did not get up)  Chair/bed transfer assist level: Maximal Assistance - Patient 25 - 49%     Locomotion Ambulation   Ambulation assist   Ambulation activity did not occur: Safety/medical concerns          Walk 10 feet activity   Assist  Walk 10 feet activity did not occur: Safety/medical concerns        Walk 50 feet activity   Assist Walk 50 feet with 2 turns activity did not occur: Safety/medical concerns         Walk 150 feet activity   Assist Walk 150 feet activity did not occur: Safety/medical concerns         Walk 10 feet on uneven surface  activity   Assist Walk 10 feet on uneven surfaces activity did not occur: Safety/medical concerns         Wheelchair     Assist Is the patient using a wheelchair?: Yes Type of Wheelchair: Manual    Wheelchair assist level: Dependent - Patient 0%      Wheelchair 50 feet with 2 turns activity    Assist        Assist Level: Dependent - Patient 0%   Wheelchair 150 feet activity     Assist      Assist Level: Dependent - Patient 0%   Blood pressure (!) 114/48, pulse 65, temperature 97.7 F (36.5 C), resp. rate 17, height 5' 1 (1.549 m), weight 68.2 kg, SpO2 96%.  Medical Problem List and Plan: 1. Functional deficits secondary to R MCA stroke             -patient may  shower             -ELOS/Goals: 16-2 days- min-mod A             Continue CIR  D3 started, increase to 2,000U  2.  Impaired mobility: -DVT/anticoagulation:  Pharmaceutical: continue Lovenox              -antiplatelet therapy: Continue ASA.  --Plavix recommended if no further procedures planned. Discussed plan for PEG on Monday  3. Muscle spasms in back: Continue lidocaine  patch. Baclofen  5mg  added HS             --sedated from Flexeril  5 mg/tramadol  50 mg this afternoon. Will decrease doses. Decrease prn  Tramadol  to 50mg  q8H prn  Continue  kpad  4. Hospital induced delirium at night: daughter may stay over at night -Conitnue melatonin at 8 pm tonight. Sleep wake chart to monitor sleep              -antipsychotic agents: N/A  5. Neuropsych/cognition: This patient may be intermittently capable of making decisions on her own behalf.  6. Daytime somnolence: modafinil  100mg  daily ordered  7. Dysphagia: Continue tube feeds-->does not seem to have water  flushes but renal status WNL. Discussed plan for PEG on Monday with daughter [name]  8.  E coli bacteremia: Treated with 10 day course of antibiotics. Ceftriaxone -->Duricef thru 07/14. Thrombocytopenia/leucocytosis has resolved.              --Hematuria has resolved. No dysuria.   9. Chronic back/Right knee pain: Continue voltaren  gel qid to knee and back till family bring in the biofreeze (which works better)             --Lidocaine  patches at nights to help with back spasms. Decrease flexeril  to 2.5 mg every 8 hours prn             --schedule tylenol  500 mg qid as LFTs resolved.   10. Abnormal LFTs: Have resolved and Lipitor added on 07/19. Continue to monitor weekly for  now.   11. COPD/OSA: Has CPAP but not using PTA. Continue Yupelri  nebs daily (in place of Spiriva).  --Used albuterol  every night PTA.  .  12. Hypomagnesemia: Magnesium  reviewed and has normalized  13. Anemia: Monitor for signs of bleeding. H/H recovering from 13-->9.9-->11.2  14.  Bradycardia: HR note to be in 51 to 61 range and had boderline prolonged Qtc on admission. Likely due to stroke.  Repeat EKG reviewed and is stable  15.  H/o MDD/anxiety: Continue Lexapro .       LOS: 3 days A FACE TO FACE EVALUATION WAS PERFORMED  Ericka Marcellus P Keigan Girten 07/11/2024, 10:05 AM

## 2024-07-11 NOTE — Progress Notes (Signed)
 Physical Therapy Session Note  Patient Details  Name: [name] MRN: [id number] Date of Birth: 04-01-1932  Today's Date: 07/11/2024 PT Individual Time: 1345-1415 PT Individual Time Calculation (min): 30 min   Short Term Goals: Week 1:  PT Short Term Goal 1 (Week 1): Pt will iniate sitting balance with training with L sided wedge PT Short Term Goal 2 (Week 1): Pt will attempt sit to stand training PT Short Term Goal 3 (Week 1): Pt will attempt slide board transfer training to explore transfer options PT Short Term Goal 4 (Week 1): Pt will continue bed mobility training focusing on reducing caregiver burden  Skilled Therapeutic Interventions/Progress Updates:      Pt sitting up in recliner to start - agreeable to therapy treatment. Patient fatigued, has difficulty keeping eyes open during session. Responds appropriately to responses. Continues to demonstrate dense L sided weakness and R gaze preference, lacks awareness of postural control or of midline positioning. Completed +2 maxA squat pivot transfer from recliner to wheelchair, towards her affected L side. Transported to day room gym and setup at [employer] with resistance at the lightest setting. Patient needing max cues for effort and to sustain alertness during activity. She was encouraged to maintain visual contact on her LLE while stepping - she did demonstrate some volitional movement of her L leg during activity, but very inconsistent. Patient returned to her room and she requested to return to recliner for comfort. Similar assist and cues provided for squat pivot transfer. Setup with pillows supporting her L side to wedge her trunk for midline. BLE elevated, Call bell within reach.   Therapy Documentation Precautions:  Precautions Precautions: Fall Recall of Precautions/Restrictions: Impaired Precaution/Restrictions Comments: Cortrak- NPO; dense L Hemi Restrictions Weight Bearing Restrictions Per Provider Order:  No General:      Therapy/Group: Individual Therapy  [name] 07/11/2024, 2:19 PM

## 2024-07-11 NOTE — Progress Notes (Addendum)
 Nutrition Follow-up  DOCUMENTATION CODES:   Non-severe (moderate) malnutrition in context of social or environmental circumstances (aging, decline in function, poor appetite)  INTERVENTION:   Continue TF via Cortrak: Osmolite 1.5 at 45 ml/h (can hold for up to 4 hours per day for therapies) Prosource TF20 60 ml daily Free water  flushes 100 ml q4h Provides: 1430 kcal, 76 gm protein, 1286 ml free water  daily  MVI with minerals daily  NUTRITION DIAGNOSIS:   Moderate Malnutrition related to social / environmental circumstances (aging, decline in function, poor appetite) as evidenced by moderate fat depletion, mild muscle depletion. Ongoing   GOAL:   Patient will meet greater than or equal to 90% of their needs Met with TF  MONITOR:   TF tolerance  REASON FOR ASSESSMENT:   Consult Enteral/tube feeding initiation and management, Assessment of nutrition requirement/status  ASSESSMENT:   Pt with PMH of HLD, PAD, COPD with emphysema, OSA, R breast ca s/p lumpectomy/XRT/hormone therapy admitted 7/5 for RUQ pain, N/V, and poor PO intake. Dx with acalculous cholecystitis and E.coli bacteremia. Recent admission 7/5 and discharged to [facility] 7/22  Patient reports that she has been tolerating her tube feedings. She really wants some coffee; RD explained that she can not have anything by mouth at this time. Per discussion with RN, patient is tolerating TF well.   Labs reviewed.  CBG: [id number]  Medications reviewed and include vitamin D3, Metanx, MVI with minerals.  CIR admit weight: 67.2 kg Current weight: 68.2 kg  Diet Order:   Diet Order     None       EDUCATION NEEDS:   No education needs have been identified at this time  Skin:  Skin Assessment: Reviewed RN Assessment  Last BM:  7/24 type 6  Height:   Ht Readings from Last 1 Encounters:  07/08/24 5' 1 (1.549 m)    Weight:   Wt Readings from Last 1 Encounters:  07/11/24 68.2 kg    Ideal Body Weight:   47.8 kg  BMI:  Body mass index is 28.41 kg/m.  Estimated Nutritional Needs:   Kcal:  1400-1600  Protein:  70-85g  Fluid:  1.4-1.6L   Suzen HUNT RD, LDN, CNSC Contact via secure chat. If unavailable, use group chat RD Inpatient.

## 2024-07-11 NOTE — IPOC Note (Addendum)
 Overall Plan of Care [facility]) Patient Details Name: [name] MRN: [id number] DOB: 05/26/1932  Admitting Diagnosis: Acute ischemic right MCA stroke [facility])  Hospital Problems: Principal Problem:   Acute ischemic right MCA stroke [facility])     Functional Problem List: Nursing Bladder, Bowel, Endurance, Nutrition, Medication Management, Safety, Motor  PT Balance, Behavior, Edema, Endurance, Motor, Nutrition, Pain, Perception, Safety, Sensory, Skin Integrity  OT Balance, Motor, Sensory, Behavior, Skin Integrity, Cognition, Pain, Vision, Perception, Endurance, Safety  SLP Cognition, Motor, Nutrition  TR         Basic ADL's: OT Grooming, Eating, Bathing, Dressing, Toileting     Advanced  ADL's: OT       Transfers: PT Bed Mobility, Bed to Chair, Car, Floor, Occupational psychologist, Research scientist (life sciences): PT Ambulation, Psychologist, prison and probation services, Stairs     Additional Impairments: OT Fuctional Use of Upper Extremity  SLP Swallowing, Social Cognition   Problem Solving, Memory, Attention, Awareness  TR      Anticipated Outcomes Item Anticipated Outcome  Self Feeding total assistTBD - Tube feeding  Swallowing  mod A   Basic self-care  mod assistMod A  Toileting  mod assistMod A   Bathroom Transfers mod assistMod A  Bowel/Bladder  manage bowel and bladder with min/mod assist  Transfers  mod A with transfers  Locomotion  mod A with locomotion  Communication  sup A  Cognition  min A  Pain  Pain level at or below 4/10  Safety/Judgment  remain free of injury, prevent falls with cues and reminders.   Therapy Plan: PT Intensity: Minimum of 1-2 x/day ,45 to 90 minutes PT Frequency: Total of 15 hours over 7 days of combined therapies PT Duration Estimated Length of Stay: 24-26 days OT Intensity: Minimum of 1-2 x/day, 45 to 90 minutes OT Frequency:  total of 15 hours over 7 days of combined therapies OT Duration/Estimated Length of Stay: 21-28 days SLP Intensity: Minumum  of 1-2 x/day, 30 to 90 minutes SLP Frequency: 3 to 5 out of 7 days SLP Duration/Estimated Length of Stay: 3-4 weeks   Team Interventions: Nursing Interventions Patient/Family Education, Bladder Management, Medication Management, Discharge Planning, Dysphagia/Aspiration Precaution Training, Bowel Management, Psychosocial Support, Disease Management/Prevention  PT interventions Ambulation/gait training, Warden/ranger, Community reintegration, Development worker, international aid stimulation, Patient/family education, Museum/gallery curator, UE/LE Coordination activities, UE/LE Strength taining/ROM, DME/adaptive equipment instruction, Cognitive remediation/compensation, Splinting/orthotics, Pain management, Disease management/prevention, Neuromuscular re-education, Skin care/wound management, Therapeutic Exercise, Wheelchair propulsion/positioning, Visual/perceptual remediation/compensation, Therapeutic Activities, Psychosocial support, Functional mobility training, Discharge planning  OT Interventions Balance/vestibular training, Disease mangement/prevention, Functional electrical stimulation, Neuromuscular re-education, Patient/family education, Self Care/advanced ADL retraining, Splinting/orthotics, Therapeutic Exercise, UE/LE Coordination activities, Cognitive remediation/compensation, Discharge planning, DME/adaptive equipment instruction, Functional mobility training, Pain management, Psychosocial support, Skin care/wound managment, UE/LE Strength taining/ROM, Therapeutic Activities, Visual/perceptual remediation/compensation, Wheelchair propulsion/positioning  SLP Interventions Cognitive remediation/compensation, Functional tasks, Therapeutic Activities, Internal/external aids, Patient/family education, Dysphagia/aspiration precaution training, Therapeutic Exercise, Speech/Language facilitation  TR Interventions    SW/CM Interventions Discharge Planning, Patient/Family Education, Disease Management/Prevention,  Psychosocial Support   Barriers to Discharge MD  Medical stability  Nursing Nutrition means, Incontinence currently has a Cortrak with NPO. Incontinent of bowel and bladder. lives alone with hired cargiver 5hrs/day. at discharge will have 24/7 caregiver support between daughter and hired caregiver. 1-level town home with level entry, accessible bathroom.  PT Home environment access/layout, Behavior, Lack of/limited family support, Incontinence, Medication compliance, Nutrition means    OT      SLP Nutrition means  SW Nutrition means, Incontinence Nutritional means; currently with cortrak PEG placement possible   Team Discharge Planning: Destination: PT-Home ,OT- Home , SLP-Home Projected Follow-up: PT-Home health PT, 24 hour supervision/assistance, OT-  Home health OT, SLP-Home Health SLP, Outpatient SLP, 24 hour supervision/assistance Projected Equipment Needs: PT-3 in 1 bedside comode, To be determined, OT- TBDTo be determined, SLP-To be determined Equipment Details: PT- , OT-  Patient/family involved in discharge planning: PT- Patient, Family member/caregiver,  OT-Patient, Family member/caregiver, SLP-Patient, Family member/caregiver  MD ELOS: 16-20 days Medical Rehab Prognosis:  Excellent Assessment: The patient has been admitted for CIR therapies with the diagnosis of R MCA CVA. The team will be addressing functional mobility, strength, stamina, balance, safety, adaptive techniques and equipment, self-care, bowel and bladder mgt, patient and caregiver education.. Goals have been set at min/modA. Anticipated discharge destination is home.        See Team Conference Notes for weekly updates to the plan of care

## 2024-07-11 NOTE — Progress Notes (Signed)
 Occupational Therapy Session Note  Patient Details  Name: [name] MRN: [id number] Date of Birth: [birth year]  Today's Date: 07/11/2024 OT Individual Time: 9197-9084 OT Individual Time Calculation (min): 73 min    Short Term Goals: Week 1:  OT Short Term Goal 1 (Week 1): Patient will sit with min assist x 5 seconds as needed prior to transfer OT Short Term Goal 2 (Week 1): Patient will actively lean forward in midline while sitting to prepare for sit to stand OT Short Term Goal 3 (Week 1): Patient will visually locate item 15* left of midline with mod cueing OT Short Term Goal 4 (Week 1): Patient will locate left arm on body in functional context - during bathing and dressing  Skilled Therapeutic Interventions/Progress Updates:    Patient received supine in bed, daughter at bedside indicating patient had a great night.  Pain well controlled.  Patient awake and alert, agreeable to OT.  Bathed lower body and applied clean brief in bed.  Patient assisted to edge of bed with max assist.  Patient needs max assist initially to sit - pushing left and no attempt to self correct to midline.  Patient transferred to wheelchair with max assist.  Used cueing for patient to find headrest to help her orient self to midline.  In fully upright position, patient able to wash face, reach for faucet, and place right hand under running water .  Patient assisted to bathe and dress while seated in wheelchair, transitioning to standing with max assist to pull up LB clothing.  Oral care provided with suction.  Patient asking for dentures, but without adhesive, dentures ill fitting and not staying in place.  Will ask family for denture adhesive.  Patient transported to gym to address sit to stand and stand to sit transitions.  Patient lacks sufficient hip flexion in these transitions and ends up with mass well behind base of support.  Worked on flexing forward to come to stand and to return to sitting.  Adjusted  fitting on head support in wheelchair.  Patient returned to room and left up in wheelchair in reclined position.  Safety belt in place and engaged and call bell in lap.    Therapy Documentation Precautions:  Precautions Precautions: Fall Recall of Precautions/Restrictions: Impaired Precaution/Restrictions Comments: Cortrak- NPO; dense L Hemi Restrictions Weight Bearing Restrictions Per Provider Order: No  Pain: Pain Assessment Pain Scale: 0-10 Pain Score: reports no pain! Pain Location: Generalized Pain Intervention(s): Medication (See eMAR)        Therapy/Group: Individual Therapy  Chia Mowers M 07/11/2024, 9:18 AM

## 2024-07-12 DIAGNOSIS — I63511 Cerebral infarction due to unspecified occlusion or stenosis of right middle cerebral artery: Secondary | ICD-10-CM | POA: Diagnosis not present

## 2024-07-12 DIAGNOSIS — K5901 Slow transit constipation: Secondary | ICD-10-CM | POA: Diagnosis not present

## 2024-07-12 NOTE — Progress Notes (Signed)
 Occupational Therapy Session Note  Patient Details  Name: [name] MRN: [id number] Date of Birth: [birth year]  Today's Date: 07/12/2024 OT Individual Time: 9061-8973 OT Individual Time Calculation (min): 48 min    Short Term Goals: Week 1:  OT Short Term Goal 1 (Week 1): Patient will sit with min assist x 5 seconds as needed prior to transfer OT Short Term Goal 2 (Week 1): Patient will actively lean forward in midline while sitting to prepare for sit to stand OT Short Term Goal 3 (Week 1): Patient will visually locate item 15* left of midline with mod cueing OT Short Term Goal 4 (Week 1): Patient will locate left arm on body in functional context - during bathing and dressing  Skilled Therapeutic Interventions/Progress Updates:  Pt greeted supine in bed, pt agreeable to OT intervention.      Transfers/bed mobility/functional mobility:  Pt completed supine>sit to R side of bed with MAXA, most assist needed to elevate trunk and scoot hips to EOB. Pt completed squat pivot to L side with MAX A +2 at pushing to L side with RUE. Pt did complete sit>stand from w/c in gym with MAX A while tech placed towels under L side of w/c cushion to orient pt to midline d/t L lean.    ADLs:  LB dressing: donned pants /brief from bed level with MAX A +2 with pt rolling to L side with MIN A while therapist and tech pulled pants to waist line.   NMR:  Donned NMES to L wrist extensors at the below setting to facilitate improved proprioceptive feedback. Pt with decreased ability to focus on L hemibody at all needing max cues to attend to LUE during NMES.   1:1 NMES applied to L wrist extensors at the below settings:    Ratio 1:3 Rate 35 pps Waveform- Asymmetric Ramp 1.0 Pulse 300 Intensity- 11  Duration -   8 mins  No pain or adverse reactions noted after treatment    Ended session with pt reclined in w/c with all needs within reach and safety belt alarm activated.                     Therapy Documentation Precautions:  Precautions Precautions: Fall Recall of Precautions/Restrictions: Impaired Precaution/Restrictions Comments: Cortrak- NPO; dense L Hemi Restrictions Weight Bearing Restrictions Per Provider Order: No  Pain: unrated pain reported in neck, heat provided as needed     Therapy/Group: Individual Therapy  Ronal Gift [facility] 07/12/2024, 12:16 PM

## 2024-07-12 NOTE — Progress Notes (Signed)
 Speech Language Pathology Daily Session Note  Patient Details  Name: [name] MRN: [id number] Date of Birth: February 13, 1932  Today's Date: 07/12/2024 SLP Individual Time: 9268-9184 SLP Individual Time Calculation (min): 44 min  Short Term Goals: Week 1: SLP Short Term Goal 1 (Week 1): Pt will trial ice chips/ water  with SLP only in order to assess potential for initiation of [place] water  protocol SLP Short Term Goal 2 (Week 1): Patient will participate in formal cognitive testing in order to determine current cognitive function. SLP Short Term Goal 3 (Week 1): Patient will recall daily events with 80% accuracy using internal/external memory aids given min assist. SLP Short Term Goal 4 (Week 1): Patient will complete pharyngeal strengthening exercises as appropriate given mod A in order to improve swallow function  Skilled Therapeutic Interventions: Pt seen for skilled ST with focus on cognitive and swallowing goals, pt in bed sleeping soundly upon arrival but rouses with extra time and consistent verbal and tactile stim. Pt's daughter present but also sleeping soundly. Pt reports sleeping well, denies pain. SLP facilitating recall of functional, daily events with use of informal daily event log kept by daughter to achieve 70% accuracy min A. Pt independent to report I'm getting a feeding tube in my stomach and I'm on blood thinners, that makes me nervous indicating improving awareness of situation and current limitations s/p CVA. Pt very motivated for PO trials of ice chips, SLP providing set up A for oral care via suction toothbrush. HOB raised and patient consuming x5 ice chips with s/s aspiration on 100% trials. When size of ice chip reduced, patient demonstrates delayed cough vs immediate cough with larger size chips. Oral phase and bolus transit appears mildly delayed. Pt deferred further trials at this time d/t consistent s/s aspiration. Pt left in bed with alarm set and all needs within  reach, cont ST POC.   Pain Pain Assessment Pain Scale: 0-10 Pain Score: 0-No pain Faces Pain Scale: No hurt Pain Intervention(s): Medication (See eMAR)  Therapy/Group: Individual Therapy  [name] 07/12/2024, 7:49 AM

## 2024-07-12 NOTE — Progress Notes (Signed)
 PROGRESS NOTE   Subjective/Complaints:  Pt doing fine, slept ok, denies pain currently, unsure of LBM but looks like 2 days ago, urinating fine per pt. No other complaints or concerns.   ROS: as per HPI. Denies CP, SOB, abd pain, n/v/d/c back pain improved, +muscle spasms, improved sleep   Objective:   No results found.  Recent Labs    07/10/24 0456  WBC 8.4  HGB 11.4*  HCT 33.3*  PLT 229   Recent Labs    07/10/24 0456  NA 135  K 4.0  CL 98  CO2 26  GLUCOSE 121*  BUN 25*  CREATININE 0.68  CALCIUM  9.4    Intake/Output Summary (Last 24 hours) at 07/12/2024 1303 Last data filed at 07/12/2024 1220 Gross per 24 hour  Intake 600 ml  Output --  Net 600 ml        Physical Exam: Vital Signs Blood pressure 122/86, pulse (!) 52, temperature (!) 97.5 F (36.4 C), resp. rate 13, height 5' 1 (1.549 m), weight 64.4 kg, SpO2 96%.   Constitutional: No distress . Vital signs reviewed. Chronically ill appearing, resting in bed HEENT: NCAT, L facial droop, oral membranes moist, cortrak in place Neck: supple Cardiovascular: bradycardia, reg rhythm without m/r/g appreciated. No JVD    Respiratory/Chest: CTA Bilaterally without wheezes or rales. Normal effort    GI/Abdomen: soft, +BS throughout, non-tender, non-distended Ext: no clubbing, cyanosis, or edema Psych: pleasant and cooperative, flat Skin: dry, warm  PRIOR EXAMS: HENT:     Head: Normocephalic and atraumatic.     Comments: Mild L facial droop Tongue midline  Mild to moderate facial redness- on R cheek    Right Ear: External ear normal.     Left Ear: External ear normal.     Nose: Nose normal. No congestion.     Comments: Cortrak- in nare    Mouth/Throat:     Mouth: Mucous membranes are dry.  Eyes:     Comments: Couldn't complete EOM's for me- but so sleepy  Musculoskeletal:     Cervical back: Neck supple.     Comments:  RUE 5-/5 LUE 0/5  throughout RLE- 4+/5 throughout- hard to get pt to do exam LLE- PF 1/5- otherwise 0/5 in LLE, stable 7/25 Skin:    General: Skin is warm and dry.     Comments: R forearm IV Some UE bruising  A lot of moles vs neurofibromas? On skin with actinic keratosis on multiple spots  Neurological:     Mental Status: She is alert.     Comments: Moderate dysarthria with occasional wet voice. Left facial weakness with left inattention. Left HH and needs cues to look to left. Able to smaller numbers on the clock. She was able to answer basic orientation questions, state age, DOB and PCP name.  Left hemiparesis with sensory deficits.  No clonus, no hoffman's Strong (+) Babinski Flaccid on L side  Psychiatric:     Comments: Alert and smiling   Assessment/Plan: 1. Functional deficits which require 3+ hours per day of interdisciplinary therapy in a comprehensive inpatient rehab setting. Physiatrist is providing close team supervision and 24 hour management of active medical problems listed  below. Physiatrist and rehab team continue to assess barriers to discharge/monitor patient progress toward functional and medical goals  Care Tool:  Bathing    Body parts bathed by patient: Face   Body parts bathed by helper: Right arm, Left arm, Chest, Abdomen, Front perineal area, Buttocks, Left upper leg, Right lower leg, Left lower leg, Right upper leg     Bathing assist Assist Level: Total Assistance - Patient < 25%     Upper Body Dressing/Undressing Upper body dressing   What is the patient wearing?: Pull over shirt    Upper body assist Assist Level: Maximal Assistance - Patient 25 - 49%    Lower Body Dressing/Undressing Lower body dressing      What is the patient wearing?: Pants     Lower body assist Assist for lower body dressing: 2 Helpers (MAX A +2)     Toileting Toileting    Toileting assist Assist for toileting: Dependent - Patient 0%     Transfers Chair/bed transfer  Transfers  assist  Chair/bed transfer activity did not occur: Safety/medical concerns (did not get up)  Chair/bed transfer assist level: 2 Helpers (MAX A +2 squat pivot)     Locomotion Ambulation   Ambulation assist   Ambulation activity did not occur: Safety/medical concerns          Walk 10 feet activity   Assist  Walk 10 feet activity did not occur: Safety/medical concerns        Walk 50 feet activity   Assist Walk 50 feet with 2 turns activity did not occur: Safety/medical concerns         Walk 150 feet activity   Assist Walk 150 feet activity did not occur: Safety/medical concerns         Walk 10 feet on uneven surface  activity   Assist Walk 10 feet on uneven surfaces activity did not occur: Safety/medical concerns         Wheelchair     Assist Is the patient using a wheelchair?: Yes Type of Wheelchair: Manual    Wheelchair assist level: Dependent - Patient 0%      Wheelchair 50 feet with 2 turns activity    Assist        Assist Level: Dependent - Patient 0%   Wheelchair 150 feet activity     Assist      Assist Level: Dependent - Patient 0%   Blood pressure 122/86, pulse (!) 52, temperature (!) 97.5 F (36.4 C), resp. rate 13, height 5' 1 (1.549 m), weight 64.4 kg, SpO2 96%.  Medical Problem List and Plan: 1. Functional deficits secondary to R MCA stroke             -patient may  shower             -ELOS/Goals: 16-2 days- min-mod A             Continue CIR  D3 started, increase to 2,000U, metanx also ordered  2.  Impaired mobility: -DVT/anticoagulation:  Pharmaceutical: continue Lovenox  40mg  daily             -antiplatelet therapy: Continue ASA.  --Plavix  recommended if no further procedures planned. Discussed plan for PEG on Monday  3. Muscle spasms in back: Continue lidocaine  patches. Tylenol  500mg  TID/HS, Baclofen  5mg  added HS. Voltaren  gel.   --sedated from Flexeril  5 mg/tramadol  50 mg this afternoon. Will  decrease doses. Decrease prn Tramadol  to 50mg  q8H prn and flexeril  2.5mg  TID PRN  Continue kpad  4. Hospital induced delirium at night: daughter may stay over at night -Conitnue melatonin 5mg  at 8 pm tonight. Sleep wake chart to monitor sleep              -antipsychotic agents: N/A  5. Neuropsych/cognition: This patient may be intermittently capable of making decisions on her own behalf.  6. Daytime somnolence: modafinil  100mg  daily ordered  7. Dysphagia: Continue tube feeds-->does not seem to have water  flushes but renal status WNL. Discussed plan for PEG on Monday with daughter [name]  8.  E coli bacteremia: Treated with 10 day course of antibiotics. Ceftriaxone -->Duricef thru 07/14. Thrombocytopenia/leucocytosis has resolved.              --Hematuria has resolved. No dysuria.   9. Chronic back/Right knee pain: SEE #3 ABOVE; Continue voltaren  gel qid to knee and back till family bring in the biofreeze (which works better) --Lidocaine  patches at nights to help with back spasms. Decrease flexeril  to 2.5 mg every 8 hours prn             --schedule tylenol  500 mg qid as LFTs resolved.   10. Abnormal LFTs: Have resolved and Lipitor 10mg  daily added on 07/19. Continue to monitor weekly for  now.   11. COPD/OSA: Has CPAP but not using PTA. Continue Yupelri  nebs daily (in place of Spiriva).  --Used albuterol  every night PTA.  .  12. Hypomagnesemia: Magnesium  reviewed and has normalized  13. Anemia: Monitor for signs of bleeding. H/H recovering from 13-->9.9-->11.2  14.  Bradycardia: HR note to be in 51 to 61 range and had boderline prolonged Qtc on admission. Likely due to stroke.  Repeat EKG reviewed and is stable  15.  H/o MDD/anxiety: Continue Lexapro  40mg  daily.  16. Bowel management:  -07/12/24 not on meds, LBM documented as 07/10/24, monitor closely.       LOS: 4 days A FACE TO FACE EVALUATION WAS PERFORMED  [address] [place] 07/12/2024, 1:03 PM

## 2024-07-12 NOTE — Progress Notes (Signed)
 Physical Therapy Session Note  Patient Details  Name: [name] MRN: [id number] Date of Birth: 1932/03/18  Today's Date: 07/12/2024 PT Individual Time: 1120-1200 PT Individual Time Calculation (min): 40 min   Short Term Goals: Week 1:  PT Short Term Goal 1 (Week 1): Pt will iniate sitting balance with training with L sided wedge PT Short Term Goal 2 (Week 1): Pt will attempt sit to stand training PT Short Term Goal 3 (Week 1): Pt will attempt slide board transfer training to explore transfer options PT Short Term Goal 4 (Week 1): Pt will continue bed mobility training focusing on reducing caregiver burden  Skilled Therapeutic Interventions/Progress Updates: Patient sitting in [facility] WC with daughter present on entrance to room. Patient alert and agreeable to PT session.   Patient reported unrated pain in neck (OT provided head pad during previous session and pt had it still donned on B upper traps). Manual therapy performed. Pt required max cuing throughout session to increase awareness to L side (R gaze and slight R cervical rotation). Pt readjusted in WC to increase comfortability and tolerance to being OOB. Pt with towel underneath L hip of seat cushion due to L lean/push (placed during OT session). PTA placed pillow between R hip and metal framing of arm rest as pt heavily pushes to L, causing R hip to press deeply into framing. PTA communicated about positioning to NT and pt's daughter to ensure that pillow stays between (communicated to team to look for foam pads or something of the like to place so that it can doesn't have the chance to fall out if no one is present). Pt required maxA + 2 for safety to perform semi-stand from [facility] while pillow was placed on R, and pt readjusted in WC (pt left in WC in better postural alignment).  Pt performed NMRE of PTA passively moving L LE through LAQ while touching quadriceps with B UE's (needed max cuing to maintain R UE on quads due to poor  inattention to L, and poor attention to task), and to consciously think about kicking a ball during knee extension. Pt and daughter educated on rational of intervention to increase neuromuscular connection to paretic side.  Manual Therapy: Palpation of B upper traps performed with trigger points noted. Education and rationale provided with pt agreeing to participate in intervention. - Trigger point release to B upper traps with soft tissue mobilization to follow throughout (STM also performed paraspinals of cervical spine). Increased time required to adjust to pt's tolerance to trigger point release. PTA also applied hot pack on B upper traps halfway through as pt reported decrease in tolerance to manual therapy. After hot pack removed (roughly 8 min), pt able to tolerate more manual therapy with noted release in trigger point (still present, so pt would greatly benefit from continue intervention to relieve pain, increase tolerance to being OOB (reports it is painful to hold neck up without support), and increase cervical ROM.   Patient sitting in WC at end of session with brakes locked, daughter present, belt alarm set, and all needs within reach.      Therapy Documentation Precautions:  Precautions Precautions: Fall Recall of Precautions/Restrictions: Impaired Precaution/Restrictions Comments: Cortrak- NPO; dense L Hemi Restrictions Weight Bearing Restrictions Per Provider Order: No  Therapy/Group: Individual Therapy  Topaz Raglin PTA 07/12/2024, 12:17 PM

## 2024-07-13 DIAGNOSIS — K029 Dental caries, unspecified: Secondary | ICD-10-CM | POA: Diagnosis not present

## 2024-07-13 DIAGNOSIS — I63511 Cerebral infarction due to unspecified occlusion or stenosis of right middle cerebral artery: Secondary | ICD-10-CM | POA: Diagnosis not present

## 2024-07-13 DIAGNOSIS — K5901 Slow transit constipation: Secondary | ICD-10-CM | POA: Diagnosis not present

## 2024-07-13 MED ORDER — AMOXICILLIN-POT CLAVULANATE 875-125 MG PO TABS
1.0000 | ORAL_TABLET | Freq: Two times a day (BID) | ORAL | Status: AC
  Administered 2024-07-13 – 2024-07-17 (×10): 1
  Filled 2024-07-13 (×10): qty 1

## 2024-07-13 NOTE — Progress Notes (Signed)
 Occupational Therapy Session Note  Patient Details  Name: [name] MRN: [id number] Date of Birth: 1932/11/30  Today's Date: 07/13/2024 OT Individual Time: 1121-1200 OT Individual Time Calculation (min): 39 min  Session 2:  OT Individual Time: 8551-8470 OT Individual Time Calculation (min): 41 min   Short Term Goals: Week 1:  OT Short Term Goal 1 (Week 1): Patient will sit with min assist x 5 seconds as needed prior to transfer OT Short Term Goal 2 (Week 1): Patient will actively lean forward in midline while sitting to prepare for sit to stand OT Short Term Goal 3 (Week 1): Patient will visually locate item 15* left of midline with mod cueing OT Short Term Goal 4 (Week 1): Patient will locate left arm on body in functional context - during bathing and dressing  Skilled Therapeutic Interventions/Progress Updates:     AM Session: Pt received sitting up in wc presenting fatigued, however to be in good spirits receptive to skilled OT session reporting 4/10 pain in neck and upper back- OT offering intermittent rest breaks, repositioning, and therapeutic support to optimize participation in therapy session. Pt noted to be soiled- requesting to get cleaned up, change clothes, and return to bed. Focused this session on ADL retraining, body awareness, and functional transfer training. RN in/out during session to pause NG feed. From wc, worked on anterior weight shifting to bring back off posterior portion of wc as a preparatory skill for functional transfers. She completed 3 trials with short rest breaks provided between trials during dressing tasks. Re-educated Pt on hemi-dressing technique. With shirt set-up, Pt able to locate L UE and weave it into shirt with verbal cues and then bring shirt OH and weave R UE following +verbal cues for technique. Education provided on squat pivot technique with emphasis on head hip relationship, Pt verbalizing understanding. She was able to bring her chin to OT's  shoulder (external visual cue) with light min A and complete squat pivot to L with max-total A +2 present for safety and to assist with guiding hips. EOB > supine total A. Pt participated in rolling in bed R<>L during peri-care and clothing management requiring total A to roll to R and mod a to roll to L +verbal cues for technique. Donned clean brief and pants total A. Pt noted to have edema in L UE. Provided education on L UE positioning and gentle edema massage to complete to both Pt and Pt's DTR with demonstration provided. At end of session, Pt able to located L UE and position it on pillows with min A +max verbal cues to locate L UE. Pt was left resting in bed with call bell in reach, bed alarm on, and all needs met.   PM Session:  Pt received semi-reclined in bed with coretrak running, RN in/out to pause feed for therapy session. Pt presenting to be fatigued, however in good spirits receptive to skilled OT session with min motivation required. Pt reporting 4/10 pain in neck/upper back- OT offering intermittent rest breaks, repositioning, and therapeutic support to optimize participation in therapy session. Focused this session on ADL retraining, dynamic sitting balance, body awareness, and L side attention. Pt requesting ice chips- utilized ice chips as motivator for therapy session. Donned pants in bed with total A required to weave B LEs. Pt then able to bridge hips with OT stabilizing L LE x2 for pants to be brought to waist total A. She was able to follow verbal cues to roll to L with  MOD A using bed rail and she transitioned to sitting EOB with MAX A+2. While sitting EOB, engaged Pt in completing oral care using suction toothbrush with education provided on technique and intermittent MIN HOH A required to maintain suction. Pt heavily leaning posteriorly and L laterally requiring total A to maintain sitting balance. Facilitated Pt reaching laterally towards foot board of bed with R UE to facilitate  decreased pushing with noted improvement, however Pt becomes distracted very easily and begins to change position falling back to the L. +2 providing intermittent posterior trunk support for rest breaks. Worked on anterior weight shifting as Print production planner for sit<>stands and with Pt able to bring trunk off posterior support to bring chin towards therapist's shoulder x4 trials with MIN-MOD A required overall. Facilitated increased L side attention by having Pt turn head to L to locate ice chips x5 trials this session- MOD-MAX verbal cues required to turn head to L. WB'ing through L UE facilitated while sitting EOB through positioning for increased proprioceptive feedback. Pt fatigued at end of session. She returned to bed with MAX Ax2. Pt able to locate L UE at end of session and position it on a pillow with min A. Pt was left resting in bed with call bell in reach, bed alarm on, and all needs met.     Therapy Documentation Precautions:  Precautions Precautions: Fall Recall of Precautions/Restrictions: Impaired Precaution/Restrictions Comments: Cortrak- NPO; dense L Hemi Restrictions Weight Bearing Restrictions Per Provider Order: No   Therapy/Group: Individual Therapy  Katheryn SHAUNNA [place] 07/13/2024, 8:00 AM

## 2024-07-13 NOTE — Progress Notes (Signed)
 PROGRESS NOTE   Subjective/Complaints:  Pt doing ok but says her R jaw/tooth hurts, has several bad teeth and she feels one is abscessed or becoming that way.  Slept good, pain otherwise fine, unsure of LBM but looks like yesterday, urinating fine per pt (incontinent). No other complaints or concerns.   ROS: as per HPI. Denies CP, SOB, abd pain, n/v/d/c back pain improved, +muscle spasms, improved sleep   Objective:   No results found.  No results for input(s): WBC, HGB, HCT, PLT in the last 72 hours.  No results for input(s): NA, K, CL, CO2, GLUCOSE, BUN, CREATININE, CALCIUM  in the last 72 hours.   Intake/Output Summary (Last 24 hours) at 07/13/2024 1142 Last data filed at 07/13/2024 0848 Gross per 24 hour  Intake 600 ml  Output --  Net 600 ml        Physical Exam: Vital Signs Blood pressure 117/61, pulse (!) 53, temperature 98.2 F (36.8 C), temperature source Oral, resp. rate 14, height 5' 1 (1.549 m), weight 65.6 kg, SpO2 100%.   Constitutional: No distress . Vital signs reviewed. Chronically ill appearing, sitting up in w/c in therapy gym HEENT: NCAT, L facial droop, oral membranes moist, cortrak in place, R lower molars decayed, unable to visualize teeth well but no definite abscess palpable through cheek, some tenderness to R jawline adjacent to poor dentitia Neck: supple Cardiovascular: bradycardia, reg rhythm without m/r/g appreciated. No JVD    Respiratory/Chest: CTA Bilaterally without wheezes or rales. Normal effort    GI/Abdomen: soft, +BS throughout, non-tender, non-distended Ext: no clubbing, cyanosis, or edema Psych: pleasant and cooperative, flat Skin: dry, warm  PRIOR EXAMS: HENT:     Head: Normocephalic and atraumatic.     Comments: Mild L facial droop Tongue midline  Mild to moderate facial redness- on R cheek    Right Ear: External ear normal.     Left Ear:  External ear normal.     Nose: Nose normal. No congestion.     Comments: Cortrak- in nare    Mouth/Throat:     Mouth: Mucous membranes are dry.  Eyes:     Comments: Couldn't complete EOM's for me- but so sleepy  Musculoskeletal:     Cervical back: Neck supple.     Comments:  RUE 5-/5 LUE 0/5 throughout RLE- 4+/5 throughout- hard to get pt to do exam LLE- PF 1/5- otherwise 0/5 in LLE, stable 7/25 Skin:    General: Skin is warm and dry.     Comments: R forearm IV Some UE bruising  A lot of moles vs neurofibromas? On skin with actinic keratosis on multiple spots  Neurological:     Mental Status: She is alert.     Comments: Moderate dysarthria with occasional wet voice. Left facial weakness with left inattention. Left HH and needs cues to look to left. Able to smaller numbers on the clock. She was able to answer basic orientation questions, state age, DOB and PCP name.  Left hemiparesis with sensory deficits.  No clonus, no hoffman's Strong (+) Babinski Flaccid on L side  Psychiatric:     Comments: Alert and smiling   Assessment/Plan: 1. Functional deficits which  require 3+ hours per day of interdisciplinary therapy in a comprehensive inpatient rehab setting. Physiatrist is providing close team supervision and 24 hour management of active medical problems listed below. Physiatrist and rehab team continue to assess barriers to discharge/monitor patient progress toward functional and medical goals  Care Tool:  Bathing    Body parts bathed by patient: Face   Body parts bathed by helper: Right arm, Left arm, Chest, Abdomen, Front perineal area, Buttocks, Left upper leg, Right lower leg, Left lower leg, Right upper leg     Bathing assist Assist Level: Total Assistance - Patient < 25%     Upper Body Dressing/Undressing Upper body dressing   What is the patient wearing?: Pull over shirt    Upper body assist Assist Level: Maximal Assistance - Patient 25 - 49%    Lower Body  Dressing/Undressing Lower body dressing      What is the patient wearing?: Pants     Lower body assist Assist for lower body dressing: 2 Helpers (MAX A +2)     Toileting Toileting    Toileting assist Assist for toileting: Dependent - Patient 0%     Transfers Chair/bed transfer  Transfers assist  Chair/bed transfer activity did not occur: Safety/medical concerns (did not get up)  Chair/bed transfer assist level: 2 Helpers (MAX A +2 squat pivot)     Locomotion Ambulation   Ambulation assist   Ambulation activity did not occur: Safety/medical concerns          Walk 10 feet activity   Assist  Walk 10 feet activity did not occur: Safety/medical concerns        Walk 50 feet activity   Assist Walk 50 feet with 2 turns activity did not occur: Safety/medical concerns         Walk 150 feet activity   Assist Walk 150 feet activity did not occur: Safety/medical concerns         Walk 10 feet on uneven surface  activity   Assist Walk 10 feet on uneven surfaces activity did not occur: Safety/medical concerns         Wheelchair     Assist Is the patient using a wheelchair?: Yes Type of Wheelchair: Manual    Wheelchair assist level: Dependent - Patient 0%      Wheelchair 50 feet with 2 turns activity    Assist        Assist Level: Dependent - Patient 0%   Wheelchair 150 feet activity     Assist      Assist Level: Dependent - Patient 0%   Blood pressure 117/61, pulse (!) 53, temperature 98.2 F (36.8 C), temperature source Oral, resp. rate 14, height 5' 1 (1.549 m), weight 65.6 kg, SpO2 100%.  Medical Problem List and Plan: 1. Functional deficits secondary to R MCA stroke             -patient may  shower             -ELOS/Goals: 16-2 days- min-mod A             Continue CIR  D3 started, increase to 2,000U, metanx also ordered  2.  Impaired mobility: -DVT/anticoagulation:  Pharmaceutical: continue Lovenox  40mg   daily             -antiplatelet therapy: Continue ASA.  --Plavix  recommended if no further procedures planned. Discussed plan for PEG on Monday  3. Muscle spasms in back: Continue lidocaine  patches. Tylenol  500mg  TID/HS, Baclofen  5mg  added HS.  Voltaren  gel.   --sedated from Flexeril  5 mg/tramadol  50 mg this afternoon. Will decrease doses. Decrease prn Tramadol  to 50mg  q8H prn and flexeril  2.5mg  TID PRN  Continue kpad  4. Hospital induced delirium at night: daughter may stay over at night -Conitnue melatonin 5mg  at 8 pm tonight. Sleep wake chart to monitor sleep              -antipsychotic agents: N/A  5. Neuropsych/cognition: This patient may be intermittently capable of making decisions on her own behalf.  6. Daytime somnolence: modafinil  100mg  daily ordered  7. Dysphagia: Continue tube feeds-->does not seem to have water  flushes but renal status WNL. Discussed plan for PEG on Monday with daughter [name]  8.  E coli bacteremia: Treated with 10 day course of antibiotics. Ceftriaxone -->Duricef thru 07/14. Thrombocytopenia/leucocytosis has resolved.              --Hematuria has resolved. No dysuria.   9. Chronic back/Right knee pain: SEE #3 ABOVE; Continue voltaren  gel qid to knee and back till family bring in the biofreeze (which works better) --Lidocaine  patches at nights to help with back spasms. Decrease flexeril  to 2.5 mg every 8 hours prn             --schedule tylenol  500 mg qid as LFTs resolved.   10. Abnormal LFTs: Have resolved and Lipitor 10mg  daily added on 07/19. Continue to monitor weekly for  now.   11. COPD/OSA: Has CPAP but not using PTA. Continue Yupelri  nebs daily (in place of Spiriva).  --Used albuterol  every night PTA.  .  12. Hypomagnesemia: Magnesium  reviewed and has normalized  13. Anemia: Monitor for signs of bleeding. H/H recovering from 13-->9.9-->11.2  14.  Bradycardia: HR note to be in 51 to 61 range and had boderline prolonged Qtc on admission. Likely due  to stroke.  Repeat EKG reviewed and is stable  15.  H/o MDD/anxiety: Continue Lexapro  40mg  daily.  16. Bowel management:  -07/12/24 not on meds, LBM documented as 07/10/24, monitor closely.   -07/13/24 LBM yesterday, monitor 17. R jaw/tooth pain: -07/13/24 pt thinks she has an abscessed tooth, though difficult to visualize so can't confirm; in an abundance of caution, will start on Augmentin  BID x5 days to treat any potential dental infection; needs to f/up outpatient with a dentist eventually.       LOS: 5 days A FACE TO FACE EVALUATION WAS PERFORMED  [address] 07/13/2024, 11:42 AM

## 2024-07-13 NOTE — Plan of Care (Signed)
  Problem: Consults Goal: RH STROKE PATIENT EDUCATION Description: See Patient Education module for education specifics  Outcome: Progressing   

## 2024-07-13 NOTE — Progress Notes (Signed)
 Physical Therapy Session Note  Patient Details  Name: [name] MRN: [id number] Date of Birth: Jul 23, 1932  Today's Date: 07/13/2024 PT Individual Time: 1000-1055 PT Individual Time Calculation (min): 55 min   Short Term Goals: Week 1:  PT Short Term Goal 1 (Week 1): Pt will iniate sitting balance with training with L sided wedge PT Short Term Goal 2 (Week 1): Pt will attempt sit to stand training PT Short Term Goal 3 (Week 1): Pt will attempt slide board transfer training to explore transfer options PT Short Term Goal 4 (Week 1): Pt will continue bed mobility training focusing on reducing caregiver burden  Skilled Therapeutic Interventions/Progress Updates:  Pt was seen bedside in the am. Pt rolled R with max to total A and rolled L with max A to assist with brief change and donning pants. Pt transferred supine to edge of bed with max to total A. Pt transferred edge of bed to w/c with max to total A and verbal cues. Pt transported to rehab gym. Pt transferred sit to stand in parallel bars with max A and verbal cues. Pt transferred w/c to edge of mat with max A and verbal cues. Pt tolerated edge of bed with about 15 minutes while on edge of mat. Treatment focused on NMR, weighting and upright posture. Pt transitioned from upright to propped on R elbow. Pt required contact guard to mod A for sitting balance. Pt transferred edge of mat to w/c with max to total A. Pt returned to room and left sitting up in tilt in space w/c with chair alarm in place and call bell within reach.   Therapy Documentation Precautions:  Precautions Precautions: Fall Recall of Precautions/Restrictions: Impaired Precaution/Restrictions Comments: Cortrak- NPO; dense L Hemi Restrictions Weight Bearing Restrictions Per Provider Order: No General:   Vital Signs:   Pain: Pt c/o soreness L LE and neck.    Therapy/Group: Individual Therapy  [name] 07/13/2024, 12:07 PM

## 2024-07-13 NOTE — Progress Notes (Signed)
+/-   sleep. PRN dulcolax supp. Given on previous shift at 1834. 2 large incontinent BM's. PRN tylenol  #3 given at 0053 for complaint of HA, neck and cheek pain. Patient feels she has an abscessed tooth. NPO, cortrak in place, with osmolite infusing at 49ml/hr. Daughter in room, providing hands on care. [name]

## 2024-07-14 ENCOUNTER — Inpatient Hospital Stay (HOSPITAL_COMMUNITY)

## 2024-07-14 DIAGNOSIS — I63511 Cerebral infarction due to unspecified occlusion or stenosis of right middle cerebral artery: Secondary | ICD-10-CM | POA: Diagnosis not present

## 2024-07-14 HISTORY — PX: IR GASTROSTOMY TUBE MOD SED: IMG625

## 2024-07-14 LAB — PROTIME-INR
INR: 1.1 (ref 0.8–1.2)
Prothrombin Time: 14.8 s (ref 11.4–15.2)

## 2024-07-14 LAB — CBC
HCT: 34 % — ABNORMAL LOW (ref 36.0–46.0)
Hemoglobin: 11.4 g/dL — ABNORMAL LOW (ref 12.0–15.0)
MCH: 31.7 pg (ref 26.0–34.0)
MCHC: 33.5 g/dL (ref 30.0–36.0)
MCV: 94.4 fL (ref 80.0–100.0)
Platelets: 168 K/uL (ref 150–400)
RBC: 3.6 MIL/uL — ABNORMAL LOW (ref 3.87–5.11)
RDW: 14.3 % (ref 11.5–15.5)
WBC: 5.1 K/uL (ref 4.0–10.5)
nRBC: 0 % (ref 0.0–0.2)

## 2024-07-14 LAB — BASIC METABOLIC PANEL WITH GFR
Anion gap: 11 (ref 5–15)
BUN: 21 mg/dL (ref 8–23)
CO2: 25 mmol/L (ref 22–32)
Calcium: 9.3 mg/dL (ref 8.9–10.3)
Chloride: 101 mmol/L (ref 98–111)
Creatinine, Ser: 0.76 mg/dL (ref 0.44–1.00)
GFR, Estimated: >60 mL/min (ref >60)
Glucose, Bld: 97 mg/dL (ref 70–99)
Potassium: 4.3 mmol/L (ref 3.5–5.1)
Sodium: 137 mmol/L (ref 135–145)

## 2024-07-14 LAB — HEPATIC FUNCTION PANEL
ALT: 22 U/L (ref 0–44)
AST: 25 U/L (ref 15–41)
Albumin: 3 g/dL — ABNORMAL LOW (ref 3.5–5.0)
Alkaline Phosphatase: 73 U/L (ref 38–126)
Bilirubin, Direct: 0.1 mg/dL (ref 0.0–0.2)
Indirect Bilirubin: 0.4 mg/dL (ref 0.3–0.9)
Total Bilirubin: 0.5 mg/dL (ref 0.0–1.2)
Total Protein: 6.1 g/dL — ABNORMAL LOW (ref 6.5–8.1)

## 2024-07-14 MED ORDER — MIDAZOLAM HCL 2 MG/2ML IJ SOLN
INTRAMUSCULAR | Status: AC
  Filled 2024-07-14: qty 2

## 2024-07-14 MED ORDER — LIDOCAINE HCL 1 % IJ SOLN
20.0000 mL | Freq: Once | INTRAMUSCULAR | Status: AC
  Administered 2024-07-14: 20 mL

## 2024-07-14 MED ORDER — CEFAZOLIN SODIUM-DEXTROSE 2-4 GM/100ML-% IV SOLN
INTRAVENOUS | Status: AC | PRN
  Administered 2024-07-14: 2 g via INTRAVENOUS

## 2024-07-14 MED ORDER — IOHEXOL 300 MG/ML  SOLN
50.0000 mL | Freq: Once | INTRAMUSCULAR | Status: AC | PRN
  Administered 2024-07-14: 50 mL

## 2024-07-14 MED ORDER — GLUCAGON HCL RDNA (DIAGNOSTIC) 1 MG IJ SOLR
INTRAMUSCULAR | Status: AC | PRN
  Administered 2024-07-14: 1 mg via INTRAVENOUS

## 2024-07-14 MED ORDER — LIDOCAINE-EPINEPHRINE 1 %-1:100000 IJ SOLN
INTRAMUSCULAR | Status: AC
  Filled 2024-07-14: qty 1

## 2024-07-14 MED ORDER — CEFAZOLIN SODIUM-DEXTROSE 2-4 GM/100ML-% IV SOLN
INTRAVENOUS | Status: AC
Start: 2024-07-14 — End: 2024-07-14
  Filled 2024-07-14: qty 100

## 2024-07-14 MED ORDER — MIDAZOLAM HCL 2 MG/2ML IJ SOLN
INTRAMUSCULAR | Status: AC | PRN
Start: 2024-07-14 — End: 2024-07-14
  Administered 2024-07-14: .5 mg via INTRAVENOUS

## 2024-07-14 MED ORDER — FENTANYL CITRATE (PF) 100 MCG/2ML IJ SOLN
INTRAMUSCULAR | Status: AC
Start: 2024-07-14 — End: 2024-07-14
  Filled 2024-07-14: qty 2

## 2024-07-14 MED ORDER — GLUCAGON HCL RDNA (DIAGNOSTIC) 1 MG IJ SOLR
INTRAMUSCULAR | Status: AC
  Filled 2024-07-14: qty 1

## 2024-07-14 MED ORDER — FENTANYL CITRATE (PF) 100 MCG/2ML IJ SOLN
INTRAMUSCULAR | Status: AC | PRN
  Administered 2024-07-14: 25 ug via INTRAVENOUS

## 2024-07-14 MED ORDER — LIDOCAINE VISCOUS HCL 2 % MT SOLN
OROMUCOSAL | Status: AC
  Filled 2024-07-14: qty 15

## 2024-07-14 NOTE — Progress Notes (Signed)
 Occupational Therapy Session Note  Patient Details  Name: [name] MRN: [id number] Date of Birth: 1932/06/16  Today's Date: 07/14/2024 OT Individual Time: 8985-8888 OT Individual Time Calculation (min): 57 min    Short Term Goals: Week 1:  OT Short Term Goal 1 (Week 1): Patient will sit with min assist x 5 seconds as needed prior to transfer OT Short Term Goal 2 (Week 1): Patient will actively lean forward in midline while sitting to prepare for sit to stand OT Short Term Goal 3 (Week 1): Patient will visually locate item 15* left of midline with mod cueing OT Short Term Goal 4 (Week 1): Patient will locate left arm on body in functional context - during bathing and dressing  Skilled Therapeutic Interventions/Progress Updates:   Patient received supine in bed, sleeping.  [name] at bedside.  Patient aroused fairly easily and agreeable to OT session.  Worked on active movement in LLE as related to bridging and rolling.  Rolled toward left and assisted patient to sit edge of bed.  At edge of bed patient pushing strongly toward left side.  Patient needing targets to reach right, weight shift right.  Stand step transfer to wheelchair with max assist.  Transported patient to gym and worked on sitting balance with Ue's supported on table.  Encouraged decreased pushing in RUE/RLE, and forward supported weight shift.  Patient fatigued this session, and reporting discomfort with transfers.  Patient transported back to room and put back in bed to allow rest before next session.  Bed alarm engaged, and daughter at bedside.    Therapy Documentation Precautions:  Precautions Precautions: Fall Recall of Precautions/Restrictions: Impaired Precaution/Restrictions Comments: Cortrak- NPO; dense L Hemi Restrictions Weight Bearing Restrictions Per Provider Order: No Pain: Pain Assessment Pain Scale: 0-10 Pain Score: 6  Intermittent report of back pain, jaw pain - MD made aware.  Worked to  reposition for comfort.        Therapy/Group: Individual Therapy  [place] M 07/14/2024, 11:15 AM

## 2024-07-14 NOTE — Progress Notes (Signed)
 Scheduled meds, followed by contrast given at 2100, then  stopped Osmolite and water  flushes. Complained of feeling like she can't get my breath. No SOB or distress observed. PRN albuterol  neb treatment given at 2123. Reported relief after treatment. Incontinent of urine. Yeast like rash to bilateral buttocks. Scheduled gerhardt's butt cream applied. Bryen Hinderman A

## 2024-07-14 NOTE — Procedures (Signed)
 Interventional Radiology Procedure Note  Procedure: Fluoro Guided G tube placement  Complications: None  Estimated Blood Loss: < 10 mL  Findings: 18Fr G tube placed  Cordella DELENA Banner, MD

## 2024-07-14 NOTE — Plan of Care (Signed)
  Problem: Consults Goal: RH STROKE PATIENT EDUCATION Description: See Patient Education module for education specifics  Outcome: Progressing   Problem: RH KNOWLEDGE DEFICIT Goal: RH STG INCREASE KNOWLEDGE OF DYSPHAGIA/FLUID INTAKE Description: Patient and family will be able to demonstrate understanding of dietary modifications to prevent complications related to stroke independently using handout provided.  Outcome: Progressing Goal: RH STG INCREASE KNOWLEGDE OF HYPERLIPIDEMIA Description: Patient and family will be able to demonstrate understanding of medication management and dietary modifications to better control cholesterol levels to prevent further occurrence of stroke independently using handout provided.  Outcome: Progressing Goal: RH STG INCREASE KNOWLEDGE OF STROKE PROPHYLAXIS Description: Patient and family will be able to demonstrate understanding of medication management and dietary modifications to better control cholesterol levels to prevent further occurrence of stroke independently using handout provided.  Outcome: Progressing   Problem: RH BOWEL ELIMINATION Goal: RH STG MANAGE BOWEL WITH ASSISTANCE Description: STG Manage Bowel with min Assistance. Outcome: Not Progressing   Problem: RH BLADDER ELIMINATION Goal: RH STG MANAGE BLADDER WITH ASSISTANCE Description: STG Manage Bladder With min Assistance Outcome: Not Progressing

## 2024-07-14 NOTE — Progress Notes (Signed)
 Speech Language Pathology Daily Session Note  Patient Details  Name: [name] MRN: [id number] Date of Birth: [birth year]  Today's Date: 07/14/2024 SLP Individual Time: 1300-1345 SLP Individual Time Calculation (min): 45 min  Short Term Goals: Week 1: SLP Short Term Goal 1 (Week 1): Pt will trial ice chips/ water  with SLP only in order to assess potential for initiation of [place] water  protocol SLP Short Term Goal 2 (Week 1): Patient will participate in formal cognitive testing in order to determine current cognitive function. SLP Short Term Goal 3 (Week 1): Patient will recall daily events with 80% accuracy using internal/external memory aids given min assist. SLP Short Term Goal 4 (Week 1): Patient will complete pharyngeal strengthening exercises as appropriate given mod A in order to improve swallow function  Skilled Therapeutic Interventions: Skilled therapy session focused on dysphagia and cognitive goals. SLP facilitated session by prompting patient to complete x30 repetitions of chin tucks against resistance and x20 repetitions of expiratory muscle strength training at 12cm H2O. Patient completed these activities with min verbal and tactile cues. SLP provided set up A for oral care, though no PO was provided as patient is set for PEG placement this afternoon. SLP targeted cognitive goals through orientation task. Patient independently oriented to self, situation, location and month/year, though required modA utilizing external aid for date/day of the week. Patient left in bed with alarm set and call bell in reach. Continue POC.  Pain Pain in shoulder- patient repositioned  Therapy/Group: Individual Therapy  Meilani Edmundson M.A., CCC-SLP 07/14/2024, 7:45 AM

## 2024-07-14 NOTE — Progress Notes (Signed)
 PROGRESS NOTE   Subjective/Complaints: Discussed plan for PEG today Has jaw pain, orthopanogram ordered Meds being held for PEG, do not know what time this will be  ROS: as per HPI. Denies CP, SOB, abd pain, n/v/d/c back pain improved, +muscle spasms, improved sleep   Objective:   DG Abd 1 View Result Date: 07/14/2024 CLINICAL DATA:  [id number]. Dysphagia. Post contrast through NGT placement. EXAM: ABDOMEN - 1 VIEW COMPARISON:  Study of 06/25/2024. FINDINGS: 4:56 a.m. Current exam excludes the true pelvis. Feeding tube is in place with the radiopaque tip well-positioned in the distal stomach. The bowel pattern is nonobstructive. Contrast is newly noted throughout the visualized colon, with unremarkable visualized colon. No radiopaque calculi or other significant findings are seen. No supine evidence of free air. There is mild thoracolumbar levoscoliosis with advanced degenerative changes of the thoracic and lumbar spine. Osteopenia. IMPRESSION: 1. Feeding tube tip well-positioned in the distal stomach. 2. Nonobstructive bowel gas pattern. 3. Contrast is newly noted throughout the visualized colon. Electronically Signed   By: Francis Quam M.D.   On: 07/14/2024 07:29    Recent Labs    07/14/24 0548  WBC 5.1  HGB 11.4*  HCT 34.0*  PLT 168    Recent Labs    07/14/24 0548  NA 137  K 4.3  CL 101  CO2 25  GLUCOSE 97  BUN 21  CREATININE 0.76  CALCIUM  9.3     Intake/Output Summary (Last 24 hours) at 07/14/2024 1109 Last data filed at 07/14/2024 0732 Gross per 24 hour  Intake 400 ml  Output --  Net 400 ml        Physical Exam: Vital Signs Blood pressure (!) 112/96, pulse 64, temperature 98.2 F (36.8 C), resp. rate 18, height 5' 1 (1.549 m), weight 65.8 kg, SpO2 98%.   Constitutional: No distress . Vital signs reviewed. Chronically ill appearing, sitting up in w/c in therapy gym HEENT: NCAT, L facial droop, oral  membranes moist, cortrak in place, R lower molars decayed, unable to visualize teeth well but no definite abscess palpable through cheek, some tenderness to R jawline adjacent to poor dentitia Neck: supple Cardiovascular: bradycardia, reg rhythm without m/r/g appreciated. No JVD    Respiratory/Chest: CTA Bilaterally without wheezes or rales. Normal effort    GI/Abdomen: soft, +BS throughout, non-tender, non-distended Ext: no clubbing, cyanosis, or edema Psych: pleasant and cooperative, flat Skin: dry, warm HENT:     Head: Normocephalic and atraumatic.     Comments: Mild L facial droop Tongue midline  Mild to moderate facial redness- on R cheek    Right Ear: External ear normal.     Left Ear: External ear normal.     Nose: Nose normal. No congestion.     Comments: Cortrak- in nare    Mouth/Throat:     Mouth: Mucous membranes are dry.  Eyes:     Comments: Couldn't complete EOM's for me- but so sleepy  Musculoskeletal:     Cervical back: Neck supple.     Comments:  RUE 5-/5 LUE 0/5 throughout RLE- 4+/5 throughout- hard to get pt to do exam LLE- PF 1/5- otherwise 0/5 in LLE, stable 7/26 Skin:  General: Skin is warm and dry.     Comments: R forearm IV Some UE bruising  A lot of moles vs neurofibromas? On skin with actinic keratosis on multiple spots  Neurological:     Mental Status: She is alert.     Comments: Moderate dysarthria with occasional wet voice. Left facial weakness with left inattention. Left HH and needs cues to look to left. Able to smaller numbers on the clock. She was able to answer basic orientation questions, state age, DOB and PCP name.  Left hemiparesis with sensory deficits.  No clonus, no hoffman's Strong (+) Babinski Flaccid on L side  Psychiatric:     Comments: Alert and smiling   Assessment/Plan: 1. Functional deficits which require 3+ hours per day of interdisciplinary therapy in a comprehensive inpatient rehab setting. Physiatrist is providing close  team supervision and 24 hour management of active medical problems listed below. Physiatrist and rehab team continue to assess barriers to discharge/monitor patient progress toward functional and medical goals  Care Tool:  Bathing    Body parts bathed by patient: Face   Body parts bathed by helper: Right arm, Left arm, Chest, Abdomen, Front perineal area, Buttocks, Left upper leg, Right lower leg, Left lower leg, Right upper leg     Bathing assist Assist Level: Total Assistance - Patient < 25%     Upper Body Dressing/Undressing Upper body dressing   What is the patient wearing?: Pull over shirt    Upper body assist Assist Level: Maximal Assistance - Patient 25 - 49%    Lower Body Dressing/Undressing Lower body dressing      What is the patient wearing?: Pants     Lower body assist Assist for lower body dressing: 2 Helpers (MAX A +2)     Toileting Toileting    Toileting assist Assist for toileting: Dependent - Patient 0%     Transfers Chair/bed transfer  Transfers assist  Chair/bed transfer activity did not occur: Safety/medical concerns (did not get up)  Chair/bed transfer assist level: Total Assistance - Patient < 25%     Locomotion Ambulation   Ambulation assist   Ambulation activity did not occur: Safety/medical concerns          Walk 10 feet activity   Assist  Walk 10 feet activity did not occur: Safety/medical concerns        Walk 50 feet activity   Assist Walk 50 feet with 2 turns activity did not occur: Safety/medical concerns         Walk 150 feet activity   Assist Walk 150 feet activity did not occur: Safety/medical concerns         Walk 10 feet on uneven surface  activity   Assist Walk 10 feet on uneven surfaces activity did not occur: Safety/medical concerns         Wheelchair     Assist Is the patient using a wheelchair?: Yes Type of Wheelchair: Manual    Wheelchair assist level: Dependent - Patient  0%      Wheelchair 50 feet with 2 turns activity    Assist        Assist Level: Dependent - Patient 0%   Wheelchair 150 feet activity     Assist      Assist Level: Dependent - Patient 0%   Blood pressure (!) 112/96, pulse 64, temperature 98.2 F (36.8 C), resp. rate 18, height 5' 1 (1.549 m), weight 65.8 kg, SpO2 98%.  Medical Problem List and Plan: 1.  Functional deficits secondary to R MCA stroke             -patient may  shower             -ELOS/Goals: 16-2 days- min-mod A             Continue CIR  D3 started, increase to 2,000U, metanx also ordered  2.  Impaired mobility: -DVT/anticoagulation:  Pharmaceutical: continue Lovenox  40mg  daily             -antiplatelet therapy: Continue ASA.  --Plavix  recommended if no further procedures planned. Discussed plan for PEG on Monday  3. Muscle spasms in back: Continue lidocaine  patches. Tylenol  500mg  TID/HS, Baclofen  5mg  added HS. Voltaren  gel.   --sedated from Flexeril  5 mg/tramadol  50 mg this afternoon. Will decrease doses. Decrease prn Tramadol  to 50mg  q8H prn and flexeril  2.5mg  TID PRN  Continue kpad  4. Hospital induced delirium at night: daughter may stay over at night -Conitnue melatonin 5mg  at 8 pm tonight. Sleep wake chart to monitor sleep              -antipsychotic agents: N/A  5. Neuropsych/cognition: This patient may be intermittently capable of making decisions on her own behalf.  6. Daytime somnolence: modafinil  100mg  daily ordered  7. Dysphagia: Continue tube feeds-->does not seem to have water  flushes but renal status WNL. Discussed plan for PEG on Monday with daughter [name] and patient  8.  E coli bacteremia: Treated with 10 day course of antibiotics. Ceftriaxone -->Duricef thru 07/14. Thrombocytopenia/leucocytosis has resolved.              --Hematuria has resolved. No dysuria.   9. Chronic back/Right knee pain: SEE #3 ABOVE; Continue voltaren  gel qid to knee and back till family bring in the  biofreeze (which works better) --Lidocaine  patches at nights to help with back spasms. Decrease flexeril  to 2.5 mg every 8 hours prn             --schedule tylenol  500 mg qid as LFTs resolved.   10. Abnormal LFTs: Have resolved and Lipitor 10mg  daily added on 07/19. Continue to monitor weekly for  now.   11. COPD/OSA: Has CPAP but not using PTA. Continue Yupelri  nebs daily (in place of Spiriva).  --Used albuterol  every night PTA.  .  12. Hypomagnesemia: Magnesium  reviewed and has normalized  13. Anemia: Monitor for signs of bleeding. H/H recovering from 13-->9.9-->11.2  14.  Bradycardia: HR note to be in 51 to 61 range and had boderline prolonged Qtc on admission. Likely due to stroke.  Repeat EKG reviewed and is stable  15.  H/o MDD/anxiety: continue Lexapro  40mg  daily.   16. Bowel management: Last BM 7/26, continue to monitor  17. R jaw/tooth pain: Continue Augmentin  BID x5 days to treat any potential dental infection; needs to f/up outpatient with a dentist eventually. Orthopanogram ordered      LOS: 6 days A FACE TO FACE EVALUATION WAS PERFORMED  Calhoun Reichardt P Danzel Marszalek 07/14/2024, 11:09 AM

## 2024-07-14 NOTE — Progress Notes (Signed)
 Physical Therapy Session Note  Patient Details  Name: [name] MRN: [id number] Date of Birth: 1932/12/13  Today's Date: 07/14/2024 PT Individual Time: 0900-0940 PT Individual Time Calculation (min): 40 min   Short Term Goals: Week 1:  PT Short Term Goal 1 (Week 1): Pt will iniate sitting balance with training with L sided wedge PT Short Term Goal 2 (Week 1): Pt will attempt sit to stand training PT Short Term Goal 3 (Week 1): Pt will attempt slide board transfer training to explore transfer options PT Short Term Goal 4 (Week 1): Pt will continue bed mobility training focusing on reducing caregiver burden  Skilled Therapeutic Interventions/Progress Updates:       Pt in bed to start with NT finishing CBG bath and getting patient off the bed pan. Patient continent of BM - assisted with positioning while NT wiped and completed pericare. Completed UB/LB dressing at bed level with +2 dependent assist, max cues for encouraging patient to participate in self care but limited effort made on her part. Supine<>sitting EOB with totalA of 1 person with HOB flat, patient needing max.totalA for sitting balance due to pushing L and poor awareness. TotalA for squat pivot transfer into wheelchair with cues for technique and effort. Patient transported to main gym. Attempted to transfer patient to the mat table but patient deferring, reporting she's completely worn out and she also reports 8/10 jaw/tooth pain - MD made aware of this. Daughter entering the room at the end of the session who was updated on patient's tolerance to therapy session, including her fatigued level.     Therapy Documentation Precautions:  Precautions Precautions: Fall Recall of Precautions/Restrictions: Impaired Precaution/Restrictions Comments: Cortrak- NPO; dense L Hemi Restrictions Weight Bearing Restrictions Per Provider Order: No General:     Therapy/Group: Individual Therapy  Analys Ryden P Elvera Almario 07/14/2024,  7:45 AM

## 2024-07-15 MED ORDER — OSMOLITE 1.5 CAL PO LIQD: 240.0000 mL | Freq: Four times a day (QID) | ORAL | Status: DC

## 2024-07-15 MED ORDER — CLOPIDOGREL BISULFATE 75 MG PO TABS
75.0000 mg | ORAL_TABLET | Freq: Every day | ORAL | Status: DC
  Administered 2024-07-16 – 2024-07-24 (×9): 75 mg
  Filled 2024-07-15 (×9): qty 1

## 2024-07-15 MED ORDER — BENZOCAINE 10 % MT GEL
Freq: Two times a day (BID) | OROMUCOSAL | Status: DC | PRN
  Administered 2024-07-17: 1 via OROMUCOSAL
  Filled 2024-07-15: qty 9

## 2024-07-15 MED ORDER — OSMOLITE 1.5 CAL PO LIQD
237.0000 mL | Freq: Four times a day (QID) | ORAL | Status: DC
  Administered 2024-07-15 – 2024-07-27 (×48): 237 mL
  Filled 2024-07-15 (×6): qty 237

## 2024-07-15 MED ORDER — OSMOLITE 1.5 CAL PO LIQD: 300.0000 mL | Freq: Four times a day (QID) | ORAL | Status: DC

## 2024-07-15 MED ORDER — FREE WATER: 100.0000 mL | Freq: Three times a day (TID) | Status: DC

## 2024-07-15 MED ORDER — VITAMIN D 25 MCG (1000 UNIT) PO TABS
2000.0000 [IU] | ORAL_TABLET | Freq: Every day | ORAL | Status: DC
  Administered 2024-07-15 – 2024-07-24 (×10): 2000 [IU]
  Filled 2024-07-15 (×10): qty 2

## 2024-07-15 MED ORDER — FREE WATER
120.0000 mL | Freq: Three times a day (TID) | Status: DC
  Administered 2024-07-15 – 2024-07-28 (×52): 120 mL

## 2024-07-15 MED ORDER — L-METHYLFOLATE-B6-B12 3-35-2 MG PO TABS
1.0000 | ORAL_TABLET | Freq: Two times a day (BID) | ORAL | Status: DC
  Administered 2024-07-15 – 2024-07-24 (×19): 1
  Filled 2024-07-15 (×19): qty 1

## 2024-07-15 NOTE — Progress Notes (Signed)
 Physical Therapy Session Note  Patient Details  Name: [name] MRN: [id number] Date of Birth: June 24, 1932  Today's Date: 07/15/2024 PT Individual Time: 1445-1500 PT Individual Time Calculation (min): 15 min  and Today's Date: 07/15/2024 PT Missed Time: 30 Minutes Missed Time Reason: Patient fatigue  Short Term Goals: Week 1:  PT Short Term Goal 1 (Week 1): Pt will iniate sitting balance with training with L sided wedge PT Short Term Goal 2 (Week 1): Pt will attempt sit to stand training PT Short Term Goal 3 (Week 1): Pt will attempt slide board transfer training to explore transfer options PT Short Term Goal 4 (Week 1): Pt will continue bed mobility training focusing on reducing caregiver burden  Skilled Therapeutic Interventions/Progress Updates:     Pt in bed to start, Reports 10/10 neck pain and also has stomach discomfort from her procedure yesterday. Pt provided rolled towel for neck support to help with pain management. Pt refusing to get OOB but is agreeable to light exercises in the bed only.   Pt completed the following: -1x8 heel slides -1x8 hip abd/add -1x8 glut sets  Pt falling asleep during exercises and requesting to rest instead of finish her therapy session. Pt missed 30 minutes of therapy due to fatigue.     Therapy Documentation Precautions:  Precautions Precautions: Fall Recall of Precautions/Restrictions: Impaired Precaution/Restrictions Comments: Cortrak- NPO; dense L Hemi Restrictions Weight Bearing Restrictions Per Provider Order: No General:      Therapy/Group: Individual Therapy  [name] 07/15/2024, 2:54 PM

## 2024-07-15 NOTE — Progress Notes (Signed)
 PROGRESS NOTE   Subjective/Complaints: Will transition from TF to PEG feeds today, discussed with daughter at bedside that the PEG was placed without complications yesterday  ROS: as per HPI. Denies CP, SOB, abd pain, n/v/d/c back pain improved, +muscle spasms, improved sleep, +tooth pain   Objective:   IR GASTROSTOMY TUBE MOD SED Result Date: 07/15/2024 INDICATION: Dysphagia.  Placement of gastric tube for nutritional support EXAM: Percutaneous gastrostomy tube placement MEDICATIONS: 2 g Ancef  IV; Antibiotics were administered within 1 hour of the procedure. Glucagon  1 mg IV ANESTHESIA/SEDATION: Moderate (conscious) sedation was employed during this procedure. A total of Versed  0.5 mg and Fentanyl  25 mcg was administered intravenously by the radiology nurse. Total intra-service moderate Sedation Time: 16 minutes. The patient's level of consciousness and vital signs were monitored continuously by radiology nursing throughout the procedure under my direct supervision. CONTRAST:  10 mL Omnipaque  300-administered into the gastric lumen. FLUOROSCOPY: Radiation Exposure Index (as provided by the fluoroscopic device): 77.8 mGy Kerma COMPLICATIONS: None immediate. PROCEDURE: Informed written consent was obtained from the patient after a thorough discussion of the procedural risks, benefits and alternatives. All questions were addressed. Maximal Sterile Barrier Technique was utilized including caps, mask, sterile gowns, sterile gloves, sterile drape, hand hygiene and skin antiseptic. A timeout was performed prior to the initiation of the procedure. The patient's epigastric region was evaluated with ultrasound in order to delineate anatomy and marked the patient's lateral margin of the liver. The epigastric region was then prepped and draped in usual sterile fashion. Local anesthesia was achieved with 1% lidocaine  by infiltrating the subcutaneous tissue to  the level of the stomach wall. Under fluoroscopic guidance the 2 gastropexy sutures were advanced in sequential fashion by advancing the needle through a small incision until intraluminal position was verified by aspirating stomach air and tripping contrast into the posterior wall of the stomach. Once intraluminal position was verified, the T-fasteners were deployed in the needles were removed. Gastropexy tension was then applied as the 18 gauge needle was advanced through a larger incision into the stomach lumen. An Amplatz wire was then advanced through the needle and the needle was removed. With the guidewire coiled within the pyloric region of the stomach, balloon and G-tube were advanced through the incision the spanned the soft tissue and the balloon was inflated there by dilating the tract. The balloon was deflated and the 20 [place] G-tube was advanced over the guidewire into the stomach lumen. The retention balloon was inflated. The [facility] disc was closely applied to the patient's skin. Contrast was injected into the stomach verifying position. IMPRESSION: Satisfactory placement of a percutaneous gastrostomy tube and 2 T-fasteners. Electronically Signed   By: Cordella Banner   On: 07/15/2024 10:18   DG Abd 1 View Result Date: 07/14/2024 CLINICAL DATA:  [id number]. Dysphagia. Post contrast through NGT placement. EXAM: ABDOMEN - 1 VIEW COMPARISON:  Study of 06/25/2024. FINDINGS: 4:56 a.m. Current exam excludes the true pelvis. Feeding tube is in place with the radiopaque tip well-positioned in the distal stomach. The bowel pattern is nonobstructive. Contrast is newly noted throughout the visualized colon, with unremarkable visualized colon. No radiopaque calculi or other significant findings are seen.  No supine evidence of free air. There is mild thoracolumbar levoscoliosis with advanced degenerative changes of the thoracic and lumbar spine. Osteopenia. IMPRESSION: 1. Feeding tube tip well-positioned in the  distal stomach. 2. Nonobstructive bowel gas pattern. 3. Contrast is newly noted throughout the visualized colon. Electronically Signed   By: Francis Quam M.D.   On: 07/14/2024 07:29    Recent Labs    07/14/24 0548  WBC 5.1  HGB 11.4*  HCT 34.0*  PLT 168    Recent Labs    07/14/24 0548  NA 137  K 4.3  CL 101  CO2 25  GLUCOSE 97  BUN 21  CREATININE 0.76  CALCIUM  9.3     Intake/Output Summary (Last 24 hours) at 07/15/2024 1103 Last data filed at 07/15/2024 0912 Gross per 24 hour  Intake 500 ml  Output --  Net 500 ml        Physical Exam: Vital Signs Blood pressure (!) 151/52, pulse (!) 58, temperature 97.7 F (36.5 C), resp. rate 16, height 5' 1 (1.549 m), weight 65.9 kg, SpO2 93%.   Constitutional: No distress . Vital signs reviewed. Chronically ill appearing, sitting up in w/c in therapy gym HEENT: NCAT, L facial droop, oral membranes moist, cortrak in place, R lower molars decayed, unable to visualize teeth well but no definite abscess palpable through cheek, some tenderness to R jawline adjacent to poor dentitia Neck: supple Cardiovascular: bradycardia, reg rhythm without m/r/g appreciated. No JVD    Respiratory/Chest: CTA Bilaterally without wheezes or rales. Normal effort    GI/Abdomen: soft, +BS throughout, non-tender, non-distended Ext: no clubbing, cyanosis, or edema Psych: pleasant and cooperative, flat Skin: dry, warm HENT:     Head: Normocephalic and atraumatic.     Comments: Mild L facial droop Tongue midline  Mild to moderate facial redness- on R cheek    Right Ear: External ear normal.     Left Ear: External ear normal.     Nose: Nose normal. No congestion.     Comments: Cortrak- in nare    Mouth/Throat:     Mouth: Mucous membranes are dry.  Eyes:     Comments: Couldn't complete EOM's for me- but so sleepy  Musculoskeletal:     Cervical back: Neck supple.     Comments:  RUE 5-/5 LUE 0/5 throughout RLE- 4+/5 throughout- hard to get pt to  do exam LLE- PF 1/5- otherwise 0/5 in LLE, stable 7/27 Skin:    General: Skin is warm and dry.     Comments: R forearm IV Some UE bruising  A lot of moles vs neurofibromas? On skin with actinic keratosis on multiple spots  Neurological:     Mental Status: She is alert.     Comments: Moderate dysarthria with occasional wet voice. Left facial weakness with left inattention. Left HH and needs cues to look to left. Able to smaller numbers on the clock. She was able to answer basic orientation questions, state age, DOB and PCP name.  Left hemiparesis with sensory deficits.  No clonus, no hoffman's Strong (+) Babinski Flaccid on L side  Psychiatric:     Comments: Alert and smiling   Assessment/Plan: 1. Functional deficits which require 3+ hours per day of interdisciplinary therapy in a comprehensive inpatient rehab setting. Physiatrist is providing close team supervision and 24 hour management of active medical problems listed below. Physiatrist and rehab team continue to assess barriers to discharge/monitor patient progress toward functional and medical goals  Care Tool:  Bathing  Body parts bathed by patient: Face   Body parts bathed by helper: Right arm, Left arm, Chest, Abdomen, Front perineal area, Buttocks, Left upper leg, Right lower leg, Left lower leg, Right upper leg     Bathing assist Assist Level: Total Assistance - Patient < 25%     Upper Body Dressing/Undressing Upper body dressing   What is the patient wearing?: Pull over shirt    Upper body assist Assist Level: Maximal Assistance - Patient 25 - 49%    Lower Body Dressing/Undressing Lower body dressing      What is the patient wearing?: Pants     Lower body assist Assist for lower body dressing: 2 Helpers (MAX A +2)     Toileting Toileting    Toileting assist Assist for toileting: Dependent - Patient 0%     Transfers Chair/bed transfer  Transfers assist  Chair/bed transfer activity did not occur:  Safety/medical concerns (did not get up)  Chair/bed transfer assist level: Total Assistance - Patient < 25%     Locomotion Ambulation   Ambulation assist   Ambulation activity did not occur: Safety/medical concerns          Walk 10 feet activity   Assist  Walk 10 feet activity did not occur: Safety/medical concerns        Walk 50 feet activity   Assist Walk 50 feet with 2 turns activity did not occur: Safety/medical concerns         Walk 150 feet activity   Assist Walk 150 feet activity did not occur: Safety/medical concerns         Walk 10 feet on uneven surface  activity   Assist Walk 10 feet on uneven surfaces activity did not occur: Safety/medical concerns         Wheelchair     Assist Is the patient using a wheelchair?: Yes Type of Wheelchair: Manual    Wheelchair assist level: Dependent - Patient 0%      Wheelchair 50 feet with 2 turns activity    Assist        Assist Level: Dependent - Patient 0%   Wheelchair 150 feet activity     Assist      Assist Level: Dependent - Patient 0%   Blood pressure (!) 151/52, pulse (!) 58, temperature 97.7 F (36.5 C), resp. rate 16, height 5' 1 (1.549 m), weight 65.9 kg, SpO2 93%.  Medical Problem List and Plan: 1. Functional deficits secondary to R MCA stroke             -patient may  shower             -ELOS/Goals: 16-2 days- min-mod A             Continue CIR  D3 started, increase to 2,000U, metanx also ordered  2.  Impaired mobility: -DVT/anticoagulation:  Pharmaceutical: continue Lovenox  40mg  daily             -antiplatelet therapy: Continue ASA.  --Plavix  recommended if no further procedures planned. Discussed plan for PEG on Monday  3. Muscle spasms in back: continue lidocaine  patches. Tylenol  500mg  TID/HS, Baclofen  5mg  added HS. Voltaren  gel.   --sedated from Flexeril  5 mg/tramadol  50 mg this afternoon. Will decrease doses. Decrease prn Tramadol  to 50mg  q8H prn and  flexeril  2.5mg  TID PRN  Continue kpad  4. Hospital induced delirium at night: daughter may stay over at night -Conitnue melatonin 5mg  at 8 pm tonight. Sleep wake chart to monitor sleep              -  antipsychotic agents: N/A  5. Neuropsych/cognition: This patient may be intermittently capable of making decisions on her own behalf.  6. Daytime somnolence: modafinil  100mg  daily ordered  7. Dysphagia: Will transition from Cortrak to PEG feeds 7/29  8.  E coli bacteremia: Treated with 10 day course of antibiotics. Ceftriaxone -->Duricef thru 07/14. Thrombocytopenia/leucocytosis has resolved.              --Hematuria has resolved. No dysuria.   9. Chronic back/Right knee pain: SEE #3 ABOVE; Continue voltaren  gel qid to knee and back till family bring in the biofreeze (which works better) --Lidocaine  patches at nights to help with back spasms. Decrease flexeril  to 2.5 mg every 8 hours prn             --schedule tylenol  500 mg qid as LFTs resolved.   10. Abnormal LFTs: Have resolved and Lipitor 10mg  daily added on 07/19. Continue to monitor weekly for  now.   11. COPD/OSA: Has CPAP but not using PTA. Continue Yupelri  nebs daily (in place of Spiriva).  --Used albuterol  every night PTA.  .  12. Hypomagnesemia: Magnesium  reviewed and has normalized  13. Anemia: Monitor for signs of bleeding. H/H recovering from 13-->9.9-->11.2  14.  Bradycardia: HR note to be in 51 to 61 range and had boderline prolonged Qtc on admission. Likely due to stroke.  Repeat EKG reviewed and is stable  15.  H/o MDD/anxiety: continue Lexapro  40mg  daily.   16. Bowel management: Last BM 7/26, continue to monitor  17. R jaw/tooth pain: Continue Augmentin  BID x5 days to treat any potential dental infection; needs to f/up outpatient with a dentist eventually. Orthopanogram ordered, Benzocaine  ordered      LOS: 7 days A FACE TO FACE EVALUATION WAS PERFORMED  Rhemi Balbach P Josemanuel Eakins 07/15/2024, 11:03 AM

## 2024-07-15 NOTE — Progress Notes (Signed)
 Nutrition Follow-up  DOCUMENTATION CODES:   Non-severe (moderate) malnutrition in context of social or environmental circumstances (aging, decline in function, poor appetite)  INTERVENTION:   Transition to bolus feedings via G-tube: Osmolite 1.5 240 ml QID Prosource TF20 60 ml daily Free water  flushes 60 ml before and after each bolus feeding. Monitor and adjust free water  as needed to maintain sodium level WNL. Provides: 1520 kcal, 80 gm protein, 1212 ml free water  daily  Continue MVI with minerals daily via tube.  NUTRITION DIAGNOSIS:   Moderate Malnutrition related to social / environmental circumstances (aging, decline in function, poor appetite) as evidenced by moderate fat depletion, mild muscle depletion; ongoing   GOAL:   Patient will meet greater than or equal to 90% of their needs; met with TF  MONITOR:   TF tolerance  REASON FOR ASSESSMENT:   Consult Enteral/tube feeding initiation and management, Assessment of nutrition requirement/status  ASSESSMENT:   Pt with PMH of HLD, PAD, COPD with emphysema, OSA, R breast ca s/p lumpectomy/XRT/hormone therapy admitted 7/5 for RUQ pain, N/V, and poor PO intake. Dx with acalculous cholecystitis and E.coli bacteremia. Recent admission 7/5 and discharged to [facility] 7/22  7/28: S/P 18 Fr G-tube placement by IR   Patient has been receiving Osmolite 1.5 at 45 ml/h with Prosource TF20 60 ml daily via Cortrak. Tolerating well. Ready to transition to bolus feedings via G-tube today per discussion with MD and PA.   Remains NPO.   Labs reviewed.   Medications reviewed and include vitamin D3, Metanx, MVI with minerals.  CIR admit weight: 67.2 kg Current weight: 65.9 kg  Diet Order:   Diet Order             Diet NPO time specified  Diet effective midnight                   EDUCATION NEEDS:   No education needs have been identified at this time  Skin:  Skin Assessment: Reviewed RN Assessment  Last BM:  7/29 type  6  Height:   Ht Readings from Last 1 Encounters:  07/08/24 5' 1 (1.549 m)    Weight:   Wt Readings from Last 1 Encounters:  07/15/24 65.9 kg    Ideal Body Weight:  47.8 kg  BMI:  Body mass index is 27.45 kg/m.  Estimated Nutritional Needs:   Kcal:  1400-1600  Protein:  70-85g  Fluid:  1.4-1.6L   Suzen HUNT RD, LDN, CNSC Contact via secure chat. If unavailable, use group chat RD Inpatient.

## 2024-07-15 NOTE — Progress Notes (Signed)
 Speech Language Pathology Daily Session Note  Patient Details  Name: [name] MRN: [id number] Date of Birth: [birth year]  Today's Date: 07/15/2024 SLP Individual Time: 1340-1430 SLP Individual Time Calculation (min): 50 min  Short Term Goals: Week 1: SLP Short Term Goal 1 (Week 1): Pt will trial ice chips/ water  with SLP only in order to assess potential for initiation of [place] water  protocol SLP Short Term Goal 2 (Week 1): Patient will participate in formal cognitive testing in order to determine current cognitive function. SLP Short Term Goal 3 (Week 1): Patient will recall daily events with 80% accuracy using internal/external memory aids given min assist. SLP Short Term Goal 4 (Week 1): Patient will complete pharyngeal strengthening exercises as appropriate given mod A in order to improve swallow function  Skilled Therapeutic Interventions: Skilled therapy session focused on dysphagia and cognitive goals. SLP facilitated session by prompting patient to complete x10 chin tucks against resistance and x10 expiratory muscle strength training repetitions set at 12cm H2O. SLP encouraged patient to continue activities to reach designated number of repetitions, though patient requested to rest. SLP then provided set up A for oral care and offered ice chips. Patient with immediate cough/throat clear during 50% of trials, which is an improvement from prior. PO was d/c per patient request. SLP targeted cognitive goals though orientation task. Patient independently oriented to self, situation, location and month/year. Patient required total A to locate today's date and day of the week on calendar, likely due to increased fatigue and inattention. Patient reports increased pain in neck/back and verbalized request to return to bed. SLP and NT utilized maximove to return patient to bed and patient followed all verbalized directions. Patient left in bed with alarm set and call bell in reach. Continue POC.     Pain Pain in back and neck - warm compress applied and patient repositioned   Therapy/Group: Individual Therapy  Lenford Beddow M.A., CCC-SLP 07/15/2024, 7:42 AM

## 2024-07-15 NOTE — Progress Notes (Signed)
 Occupational Therapy Session Note  Patient Details  Name: [name] MRN: [id number] Date of Birth: 01/26/1932  Today's Date: 07/15/2024 OT Individual Time: 1005-1100 OT Individual Time Calculation (min): 55 min    Short Term Goals: Week 1:  OT Short Term Goal 1 (Week 1): Patient will sit with min assist x 5 seconds as needed prior to transfer OT Short Term Goal 2 (Week 1): Patient will actively lean forward in midline while sitting to prepare for sit to stand OT Short Term Goal 3 (Week 1): Patient will visually locate item 15* left of midline with mod cueing OT Short Term Goal 4 (Week 1): Patient will locate left arm on body in functional context - during bathing and dressing  Skilled Therapeutic Interventions/Progress Updates:  Skilled OT intervention completed with focus on ADL retraining, functional mobility and activity tolerance. Pt received upright in bed, agreeable to session. Daughter present stating pt had restful night, but has been very sore this AM due to PEG placement. 8/10 pain reported in L flank and L jaw; nurse notified however meds not due. OT offered rest breaks and repositioning throughout for pain reduction.  Pt very slow to initiate and often states I can't or I don't know when asked to attempt movements as independently as possible prior to assist. Needed max encouragement throughout.  Nursing paused PEG tube feed. Doffed LUE resting hand splint with noted edema in dorsal hand/digits. Light retrograde massage utilized for edema management. Opted for transition to R side (non-hemi) of bed, for greater initiation with bed mobility. Pt able to advance RLE and initiate rolling/lifting trunk on R side using bed rail, however still required max A to elevate with HOB already at 75 degrees. Required total A to scoot hips forward for stable BOS, and static sitting varied from max-total A due to posterior L lean to min A for L lean only. Pt benefited from using R hand on  bed rail to help stabilize trunk. Required +2 for sit > stand in stedy, with 1 person assisting at trunk and 2nd assisting with powering up. +2 needed for stedy transfer > w/c with 1 for L trunk lean and 2nd for stedy navigation. Max A sit > stand from perched position. Total A to position hips in midline/neutral position. Donned socks/shoes dependently.  Applied rolled towel under pillow case for makeshift arm trough due to LUE dense hemiplegia and need for functional positioning. Pt remained seated in w/c with daughter present, with belt alarm on/activated, and with all needs in reach at end of session.   Therapy Documentation Precautions:  Precautions Precautions: Fall Recall of Precautions/Restrictions: Impaired Precaution/Restrictions Comments: Cortrak- NPO; dense L Hemi Restrictions Weight Bearing Restrictions Per Provider Order: No    Therapy/Group: Individual Therapy  Lorrayne FORBES Fritter, MS, OTR/L  07/15/2024, 12:18 PM

## 2024-07-16 MED ORDER — TRAMADOL HCL 50 MG PO TABS
50.0000 mg | ORAL_TABLET | ORAL | Status: DC | PRN
  Administered 2024-07-19 – 2024-07-23 (×5): 50 mg
  Filled 2024-07-16 (×5): qty 1

## 2024-07-16 MED ORDER — BUPROPION HCL 75 MG PO TABS
75.0000 mg | ORAL_TABLET | Freq: Two times a day (BID) | ORAL | Status: DC
  Administered 2024-07-16 – 2024-08-01 (×39): 75 mg via ORAL
  Filled 2024-07-16 (×33): qty 1

## 2024-07-16 MED ORDER — SIMETHICONE 40 MG/0.6ML PO SUSP: 80.0000 mg | Freq: Four times a day (QID) | ORAL | Status: DC | PRN

## 2024-07-16 NOTE — Progress Notes (Signed)
 PROGRESS NOTE   Subjective/Complaints: No new complaints this morning Denies pain this morning, but her daughter reports she complained of abdominal pain Denies oral pain  ROS: as per HPI. Denies CP, SOB, abd pain, n/v/d/c back pain improved, +muscle spasms, improved sleep, +tooth pain- not present at this time   Objective:   IR GASTROSTOMY TUBE MOD SED Result Date: 07/15/2024 INDICATION: Dysphagia.  Placement of gastric tube for nutritional support EXAM: Percutaneous gastrostomy tube placement MEDICATIONS: 2 g Ancef  IV; Antibiotics were administered within 1 hour of the procedure. Glucagon  1 mg IV ANESTHESIA/SEDATION: Moderate (conscious) sedation was employed during this procedure. A total of Versed  0.5 mg and Fentanyl  25 mcg was administered intravenously by the radiology nurse. Total intra-service moderate Sedation Time: 16 minutes. The patient's level of consciousness and vital signs were monitored continuously by radiology nursing throughout the procedure under my direct supervision. CONTRAST:  10 mL Omnipaque  300-administered into the gastric lumen. FLUOROSCOPY: Radiation Exposure Index (as provided by the fluoroscopic device): 77.8 mGy Kerma COMPLICATIONS: None immediate. PROCEDURE: Informed written consent was obtained from the patient after a thorough discussion of the procedural risks, benefits and alternatives. All questions were addressed. Maximal Sterile Barrier Technique was utilized including caps, mask, sterile gowns, sterile gloves, sterile drape, hand hygiene and skin antiseptic. A timeout was performed prior to the initiation of the procedure. The patient's epigastric region was evaluated with ultrasound in order to delineate anatomy and marked the patient's lateral margin of the liver. The epigastric region was then prepped and draped in usual sterile fashion. Local anesthesia was achieved with 1% lidocaine  by infiltrating  the subcutaneous tissue to the level of the stomach wall. Under fluoroscopic guidance the 2 gastropexy sutures were advanced in sequential fashion by advancing the needle through a small incision until intraluminal position was verified by aspirating stomach air and tripping contrast into the posterior wall of the stomach. Once intraluminal position was verified, the T-fasteners were deployed in the needles were removed. Gastropexy tension was then applied as the 18 gauge needle was advanced through a larger incision into the stomach lumen. An Amplatz wire was then advanced through the needle and the needle was removed. With the guidewire coiled within the pyloric region of the stomach, balloon and G-tube were advanced through the incision the spanned the soft tissue and the balloon was inflated there by dilating the tract. The balloon was deflated and the [address] [place] G-tube was advanced over the guidewire into the stomach lumen. The retention balloon was inflated. The [facility] disc was closely applied to the patient's skin. Contrast was injected into the stomach verifying position. IMPRESSION: Satisfactory placement of a percutaneous gastrostomy tube and 2 T-fasteners. Electronically Signed   By: Cordella Banner   On: 07/15/2024 10:18    Recent Labs    07/14/24 0548  WBC 5.1  HGB 11.4*  HCT 34.0*  PLT 168    Recent Labs    07/14/24 0548  NA 137  K 4.3  CL 101  CO2 25  GLUCOSE 97  BUN 21  CREATININE 0.76  CALCIUM  9.3     Intake/Output Summary (Last 24 hours) at 07/16/2024 1111 Last data filed at 07/16/2024  0919 Gross per 24 hour  Intake 360 ml  Output --  Net 360 ml        Physical Exam: Vital Signs Blood pressure (!) 139/49, pulse 64, temperature 97.8 F (36.6 C), temperature source Oral, resp. rate 18, height 5' 1 (1.549 m), weight 66.8 kg, SpO2 98%.   Constitutional: No distress . Vital signs reviewed. Chronically ill appearing, sitting up in w/c in therapy gym HEENT: NCAT,  L facial droop, oral membranes moist, cortrak in place, R lower molars decayed, unable to visualize teeth well but no definite abscess palpable through cheek, some tenderness to R jawline adjacent to poor dentitia Neck: supple Cardiovascular: bradycardia, reg rhythm without m/r/g appreciated. No JVD    Respiratory/Chest: CTA Bilaterally without wheezes or rales. Normal effort    GI/Abdomen: soft, +BS throughout, non-tender, non-distended Ext: no clubbing, cyanosis, or edema Psych: pleasant and cooperative, flat Skin: dry, warm HENT:     Head: Normocephalic and atraumatic.     Comments: Mild L facial droop Tongue midline  Mild to moderate facial redness- on R cheek    Right Ear: External ear normal.     Left Ear: External ear normal.     Nose: Nose normal. No congestion.     Comments: Cortrak- in nare    Mouth/Throat:     Mouth: Mucous membranes are dry.  Eyes:     Comments: Couldn't complete EOM's for me- but so sleepy  Musculoskeletal:     Cervical back: Neck supple.     Comments:  RUE 5-/5 LUE 0/5 throughout RLE- 4+/5 throughout- hard to get pt to do exam LLE- PF 1/5- otherwise 0/5 in LLE, stable 7/30 Skin:    General: Skin is warm and dry.     Comments: R forearm IV Some UE bruising  A lot of moles vs neurofibromas? On skin with actinic keratosis on multiple spots  Neurological:     Mental Status: She is alert.     Comments: Moderate dysarthria with occasional wet voice. Left facial weakness with left inattention. Left HH and needs cues to look to left. Able to smaller numbers on the clock. She was able to answer basic orientation questions, state age, DOB and PCP name.  Left hemiparesis with sensory deficits.  No clonus, no hoffman's Strong (+) Babinski Flaccid on L side  Psychiatric:     Comments: Alert and smiling   Assessment/Plan: 1. Functional deficits which require 3+ hours per day of interdisciplinary therapy in a comprehensive inpatient rehab  setting. Physiatrist is providing close team supervision and 24 hour management of active medical problems listed below. Physiatrist and rehab team continue to assess barriers to discharge/monitor patient progress toward functional and medical goals  Care Tool:  Bathing    Body parts bathed by patient: Face, Chest   Body parts bathed by helper: Right arm, Left arm, Chest, Abdomen, Front perineal area, Buttocks, Right upper leg, Left upper leg, Right lower leg, Left lower leg     Bathing assist Assist Level: Total Assistance - Patient < 25%     Upper Body Dressing/Undressing Upper body dressing   What is the patient wearing?: Pull over shirt    Upper body assist Assist Level: Maximal Assistance - Patient 25 - 49%    Lower Body Dressing/Undressing Lower body dressing      What is the patient wearing?: Pants     Lower body assist Assist for lower body dressing: Maximal Assistance - Patient 25 - 49%  Toileting Toileting    Toileting assist Assist for toileting: Dependent - Patient 0%     Transfers Chair/bed transfer  Transfers assist  Chair/bed transfer activity did not occur: Safety/medical concerns (did not get up)  Chair/bed transfer assist level: Maximal Assistance - Patient 25 - 49%     Locomotion Ambulation   Ambulation assist   Ambulation activity did not occur: Safety/medical concerns          Walk 10 feet activity   Assist  Walk 10 feet activity did not occur: Safety/medical concerns        Walk 50 feet activity   Assist Walk 50 feet with 2 turns activity did not occur: Safety/medical concerns         Walk 150 feet activity   Assist Walk 150 feet activity did not occur: Safety/medical concerns         Walk 10 feet on uneven surface  activity   Assist Walk 10 feet on uneven surfaces activity did not occur: Safety/medical concerns         Wheelchair     Assist Is the patient using a wheelchair?: Yes Type of  Wheelchair: Manual    Wheelchair assist level: Dependent - Patient 0%      Wheelchair 50 feet with 2 turns activity    Assist        Assist Level: Dependent - Patient 0%   Wheelchair 150 feet activity     Assist      Assist Level: Dependent - Patient 0%   Blood pressure (!) 139/49, pulse 64, temperature 97.8 F (36.6 C), temperature source Oral, resp. rate 18, height 5' 1 (1.549 m), weight 66.8 kg, SpO2 98%.  Medical Problem List and Plan: 1. Functional deficits secondary to R MCA stroke             -patient may  shower             -ELOS/Goals: 16-2 days- min-mod A             Continue CIR  D3 started, increase to 2,000U, metanx also ordered  2.  Impaired mobility: -DVT/anticoagulation:  Pharmaceutical: continue Lovenox  40mg  daily             -antiplatelet therapy: Continue ASA.  --Plavix  recommended if no further procedures planned. Discussed plan for PEG on Monday  3. Muscle spasms in back: continue lidocaine  patches. Tylenol  500mg  TID/HS, Baclofen  5mg  added HS. Voltaren  gel.   --sedated from Flexeril  5 mg/tramadol  50 mg this afternoon. Will decrease doses. Decrease prn Tramadol  to 50mg  q8H prn and flexeril  2.5mg  TID PRN  Continue kpad  4. Hospital induced delirium at night: daughter may stay over at night -Conitnue melatonin 5mg  at 8 pm tonight. Sleep wake chart to monitor sleep              -antipsychotic agents: N/A  5. Neuropsych/cognition: This patient may be intermittently capable of making decisions on her own behalf.  6. Daytime somnolence: modafinil  100mg  daily ordered  7. Dysphagia: Will transition from Cortrak to PEG feeds 7/29, will message RD to see if we can transition to bolus feeds  8.  E coli bacteremia: Treated with 10 day course of antibiotics. Ceftriaxone -->Duricef thru 07/14. Thrombocytopenia/leucocytosis has resolved.              --Hematuria has resolved. No dysuria.   9. Chronic back/Right knee pain: SEE #3 ABOVE; Continue  voltaren  gel qid to knee and back till family bring in the  biofreeze (which works better) --continue Lidocaine  patches at nights to help with back spasms. Decrease flexeril  to 2.5 mg every 8 hours prn             --schedule tylenol  500 mg qid as LFTs resolved.   10. Abnormal LFTs: Have resolved and Lipitor 10mg  daily added on 07/19. Continue to monitor weekly for  now.   11. COPD/OSA: Has CPAP but not using PTA. Continue Yupelri  nebs daily (in place of Spiriva).  --Continue albuterol  every night PTA.  .  12. Hypomagnesemia: Magnesium  reviewed and has normalized  13. Anemia: Monitor for signs of bleeding. H/H recovering from 13-->9.9-->11.2  14.  Bradycardia: HR note to be in 51 to 61 range and had boderline prolonged Qtc on admission. Likely due to stroke.  Repeat EKG reviewed and is stable  15.  H/o MDD/anxiety: continue Lexapro  40mg  daily. Wellbutrin  started  16. Bowel management: Last BM 7/29, continue to monitor  17. R jaw/tooth pain: Continue Augmentin  BID x5 days to treat any potential dental infection; needs to f/up outpatient with a dentist eventually. Orthopanogram d/ced since pain has improved, Benzocaine  ordered      LOS: 8 days A FACE TO FACE EVALUATION WAS PERFORMED  Dudley Cooley P Bosco Paparella 07/16/2024, 11:11 AM

## 2024-07-16 NOTE — Progress Notes (Signed)
 Physical Therapy Weekly Progress Note  Patient Details  Name: [name] MRN: [id number] Date of Birth: February 15, 1932  Beginning of progress report period: July 09, 2024 End of progress report period: July 16, 2024  Today's Date: 07/16/2024 PT Individual Time: 1345-1445 PT Individual Time Calculation (min): 60 min   Patient has met 1 of 4 short term goals.  Unfortunately, [name] has made limited functional progress this past reporting period. 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 Her therapy schedule has been adjusted to 15/7 to accommodate for these deficits. Concerned about level of caregiver burden and making adequate progress towards returning home with her daughters assistance.   Patient continues to demonstrate the following deficits muscle weakness and muscle joint tightness, decreased cardiorespiratoy endurance, impaired timing and sequencing, unbalanced muscle activation, decreased coordination, and decreased motor planning, decreased midline orientation, decreased attention to left, and decreased motor planning, decreased initiation, decreased attention, decreased awareness, decreased problem solving, decreased safety awareness, decreased memory, and delayed processing, and decreased sitting balance, decreased standing balance, decreased postural control, hemiplegia, and decreased balance strategies and therefore will continue to benefit from skilled PT intervention to increase functional independence with mobility.  Patient not progressing toward long term goals.  See goal revision..  Plan of care revisions: See PT POC note for details.  PT Short Term Goals Week 1:  PT Short Term Goal 1 (Week 1): Pt will iniate sitting balance with  training with L sided wedge PT Short Term Goal 1 - Progress (Week 1): Not met PT Short Term Goal 2 (Week 1): Pt will attempt sit to stand training PT Short Term Goal 2 - Progress (Week 1): Not met PT Short Term Goal 3 (Week 1): Pt will attempt slide board transfer training to explore transfer options PT Short Term Goal 3 - Progress (Week 1): Not met PT Short Term Goal 4 (Week 1): Pt will continue bed mobility training focusing on reducing caregiver burden PT Short Term Goal 4 - Progress (Week 1): Met Week 2:  PT Short Term Goal 1 (Week 2): Pt will complete bed mobility with maxA of 1 person PT Short Term Goal 2 (Week 2): Pt will complete bed<>chair transfers consistently at maxA level PT Short Term Goal 3 (Week 2): Pt will sit unsupported at EOB for 1 minute with modA  Skilled Therapeutic Interventions/Progress Updates:      Pt in bed to start with her daughter, [name], at the bedside. Pt needing encouragement to participate and agreeable. Donned pants at bed level with +2 assist from [place]. Patiet requires modA for rolling to her L and totalA for rolling to her R. Donned tennis shoes with totalA before getting OOB. Supine<>sitting EOB with totalA of 1 person with cues for initiation and technique. Continues to require maxA for sitting balance due to pushing towards her paretic L side, poor awareness with no initiation of improvement or balance recovery. Able to complete squat pivot transfer with totalA from EOB into wheelchair, cues for general setup, hand placement, and for ensuring L foot was placed for BOS during transfer. Patient transported to day room gym and setup at standing frame. Dependent standing with the standing frame x3 trials for 2.5 minutes per stand. Patient needing assist for LUE positioning during the transition for standing and cues throughout for postural awareness,  promoting midline, and shifting the weight to her R side. Standing tolerance limited by fatigue. Patient returned  to her room and she requested to return to bed to rest. TotalA for transferring towards her weaker L side from wheelchair. TotalA for assisting back to bed. Repositioning and boosted to [facility] Alarm on, needs met, pt made comfortable.   Therapy Documentation Precautions:  Precautions Precautions: Fall Recall of Precautions/Restrictions: Impaired Precaution/Restrictions Comments: Cortrak- NPO; dense L Hemi Restrictions Weight Bearing Restrictions Per Provider Order: No General:     Therapy/Group: Individual Therapy  [name] 07/16/2024, 7:44 AM

## 2024-07-16 NOTE — Progress Notes (Signed)
 Speech Language Pathology Weekly Progress and Session Note  Patient Details  Name: [name] MRN: [id number] Date of Birth: 06-Nov-1932  Beginning of progress report period: July 09, 2024 End of progress report period: July 16, 2024  Today's Date: 07/16/2024 SLP Individual Time: 0922-1001 SLP Individual Time Calculation (min): 39 min  Short Term Goals: Week 1: SLP Short Term Goal 1 (Week 1): Pt will trial ice chips/ water  with SLP only in order to assess potential for initiation of [place] water  protocol SLP Short Term Goal 1 - Progress (Week 1): Met SLP Short Term Goal 2 (Week 1): Patient will participate in formal cognitive testing in order to determine current cognitive function. SLP Short Term Goal 2 - Progress (Week 1): Met SLP Short Term Goal 3 (Week 1): Patient will recall daily events with 80% accuracy using internal/external memory aids given min assist. SLP Short Term Goal 3 - Progress (Week 1): Met SLP Short Term Goal 4 (Week 1): Patient will complete pharyngeal strengthening exercises as appropriate given mod A in order to improve swallow function SLP Short Term Goal 4 - Progress (Week 1): Met  New Short Term Goals: Week 2: SLP Short Term Goal 1 (Week 2): Pt will consume ice chips and teaspoons of thin liquid demonstrating reduction in frequency of overt s/sx of penetration/aspiration by at least 25% to indicate appropriateness for repeated objective swallow study. SLP Short Term Goal 2 (Week 2): Patient will recall daily events with 90% accuracy using internal/external memory aids given min assist. SLP Short Term Goal 3 (Week 2): Patient will complete pharyngeal strengthening exercises as appropriate given min A in order to improve swallow function  Weekly Progress Updates: Patient is making consistent progress towards therapy goals and has met 4/4 short term goals set this reporting period. Patient currently benefits from mod assist to complete pharyngeal strengthening  exercises and min assist for recall of daily events. She is currently tolerating the ice chip protocol. Patient and family education ongoing. Patient will continue to benefit from skilled therapy services during remainder of CIR stay.    Intensity: Minumum of 1-2 x/day, 30 to 90 minutes Frequency: 3 to 5 out of 7 days Duration/Length of Stay: 3-4 weeks Treatment/Interventions: Cognitive remediation/compensation;Functional tasks;Therapeutic Activities;Internal/external aids;Patient/family education;Dysphagia/aspiration precaution training;Therapeutic Exercise;Speech/Language facilitation  Daily Session  Skilled Therapeutic Interventions: SLP conducted skilled therapy session targeting dysphagia and cognitive goals. Patient completed EMST x5 sets of 5 at [facility] and CTAR series x3 given mod cues for accurate exercise execution. Between sets of exercises, patient consumed ice chips x5 exhibiting several instances of oral holding but eventually demonstrated full oral clearance and exhibited immediate throat clear/cough on only one trial. Throughout session, challenged memory of daily events. Patient recalled events from occupational therapy session as well as therapist's name with min assist. Throughout all exercises and trials, patient benefited from mod assist to attend to the left. Patient was left in room with call bell in reach and alarm set. SLP will continue to target goals per plan of care.       Pain None   Therapy/Group: Individual Therapy  [name], M.A., CCC-SLP  [name] 07/16/2024, 10:07 AM

## 2024-07-16 NOTE — Progress Notes (Signed)
 Occupational Therapy Weekly Progress Note  Patient Details  Name: [name] MRN: [id number] Date of Birth: Nov 02, 1932  Beginning of progress report period: July 09, 2024 End of progress report period: July 16, 2024  Today's Date: 07/16/2024 OT Individual Time: 9196-9085 OT Individual Time Calculation (min): 71 min    Patient has met 2 of 4 short term goals.  Patient has shown improved ability to tolerate upright position in supported wheelchair, some improvement in scanning left of midline.    Patient continues to demonstrate the following deficits: decreased cardiorespiratoy endurance, impaired timing and sequencing, abnormal tone, unbalanced muscle activation, motor apraxia, decreased coordination, and decreased motor planning, decreased visual perceptual skills and decreased visual motor skills, decreased attention to left, left side neglect, and decreased motor planning, decreased initiation, decreased attention, decreased awareness, decreased problem solving, decreased safety awareness, decreased memory, and delayed processing, and decreased sitting balance, decreased standing balance, decreased postural control, hemiplegia, and decreased balance strategies and therefore will continue to benefit from skilled OT intervention to enhance overall performance with BADL and Reduce care partner burden.  Patient progressing toward long term goals..  Continue plan of care.  OT Short Term Goals Week 1:  OT Short Term Goal 1 (Week 1): Patient will sit with min assist x 5 seconds as needed prior to transfer OT Short Term Goal 1 - Progress (Week 1): Met OT Short Term Goal 2 (Week 1): Patient will actively lean forward in midline while sitting to prepare for sit to stand OT Short Term Goal 2 - Progress (Week 1): Not met OT Short Term Goal 3 (Week 1): Patient will visually locate item 15* left of midline with mod cueing OT Short Term Goal 3 - Progress (Week 1): Met OT Short Term Goal 4 (Week 1):  Patient will locate left arm on body in functional context - during bathing and dressing OT Short Term Goal 4 - Progress (Week 1): Progressing toward goal Week 2:  OT Short Term Goal 1 (Week 2): Patient will actively lean forward in midline while sitting to prepare for sit to stand OT Short Term Goal 2 (Week 2): Patient will locate left arm on body in functional context - during bathing and dressing OT Short Term Goal 3 (Week 2): Patient will sit at edge of bed for 2-3 seconds using compensatory strategies and without external support.  Skilled Therapeutic Interventions/Progress Updates:   Patient received supine in bed.  Patient states I feel like this tube in my belly set me back a month.  Reassured patient that she was progressing.  Patient agreeable to get out of bed.  Rolled left and right to change brief and don pants.  Patient reports pain better with slow gentle transitions.  Patient reactive to faster movement then guarding leads to pain.  Patient assisted to sit at edge of bed with max assist - used footboard as grip for right hand to maintain sitting balance.  With hand hold could sit with min assist to contact guard.  Patient transferred to wheelchair with max assist.  Assisted patient to wash face and dress upper body.  Patient needs max assist for forward weight shift in sitting.  Patient transported to gym to address forward weight shift in sitting.  Encouraged patient to visually scan left of midline to locate items.  Patient able to sustain eyes left of midline x 2-3 seconds this session.  Encouraged weight shift to right while seated.  Patient left up in wheelchair in slightly reclined position  at end of session.  Safety belt in place and engaged and call bell/ personal items in reach and daughter at bedside.    Therapy Documentation Precautions:  Precautions Precautions: Fall Recall of Precautions/Restrictions: Impaired Precaution/Restrictions Comments: Cortrak- NPO; dense L  Hemi Restrictions Weight Bearing Restrictions Per Provider Order: No  Pain: Pain Assessment Pain Scale: 0-10 Pain Score: 5  Pain Location: Back Pain Intervention(s): Medication, Reposition PAINAD (Pain Assessment in Advanced Dementia) Breathing: normal    Therapy/Group: Individual Therapy  Tekeisha Hakim M 07/16/2024, 9:18 AM

## 2024-07-16 NOTE — Progress Notes (Signed)
 Patient ID: [name], female   DOB: 06/12/1932, 89 y.o.   MRN: [id number]  Met with pt and [name]-daughter to give team conference update regarding goals of mod assist level and target discharge date of 8/13. Daughter asked so not walking at discharge and answered her no, not at this time. Discussed options of additionally hiring more care at home versus going to a SNF. Pt wants to go home and not to a NH. Discussed getting a lift for home and will need to see how pt progresses here. Both will discuss with other daughter's and see if can provide assist to pt and work on best plan for her.

## 2024-07-17 ENCOUNTER — Inpatient Hospital Stay (HOSPITAL_COMMUNITY)

## 2024-07-17 LAB — BASIC METABOLIC PANEL WITH GFR
Anion gap: 8 (ref 5–15)
BUN: 18 mg/dL (ref 8–23)
CO2: 26 mmol/L (ref 22–32)
Calcium: 9 mg/dL (ref 8.9–10.3)
Chloride: 104 mmol/L (ref 98–111)
Creatinine, Ser: 0.55 mg/dL (ref 0.44–1.00)
GFR, Estimated: >60 mL/min (ref >60)
Glucose, Bld: 94 mg/dL (ref 70–99)
Potassium: 3.8 mmol/L (ref 3.5–5.1)
Sodium: 138 mmol/L (ref 135–145)

## 2024-07-17 LAB — CBC
HCT: 33.4 % — ABNORMAL LOW (ref 36.0–46.0)
Hemoglobin: 11.3 g/dL — ABNORMAL LOW (ref 12.0–15.0)
MCH: 31.7 pg (ref 26.0–34.0)
MCHC: 33.8 g/dL (ref 30.0–36.0)
MCV: 93.6 fL (ref 80.0–100.0)
Platelets: 153 K/uL (ref 150–400)
RBC: 3.57 MIL/uL — ABNORMAL LOW (ref 3.87–5.11)
RDW: 14.4 % (ref 11.5–15.5)
WBC: 5.8 K/uL (ref 4.0–10.5)
nRBC: 0 % (ref 0.0–0.2)

## 2024-07-17 MED ORDER — BENZOCAINE 10 % MT GEL
Freq: Three times a day (TID) | OROMUCOSAL | Status: DC
  Administered 2024-07-17 – 2024-07-30 (×8): 1 via OROMUCOSAL
  Filled 2024-07-17 (×2): qty 9

## 2024-07-17 MED ORDER — BACLOFEN 25 MG/5ML PO SUSP: 2.5000 mg | Freq: Three times a day (TID) | ORAL | Status: DC | PRN

## 2024-07-17 NOTE — Progress Notes (Signed)
 Occupational Therapy Session Note  Patient Details  Name: [name] MRN: [id number] Date of Birth: February 07, 1932  Today's Date: 07/17/2024 OT Individual Time: 9152-9067 OT Individual Time Calculation (min): 45 min    Short Term Goals: Week 2:  OT Short Term Goal 1 (Week 2): Patient will actively lean forward in midline while sitting to prepare for sit to stand OT Short Term Goal 2 (Week 2): Patient will locate left arm on body in functional context - during bathing and dressing OT Short Term Goal 3 (Week 2): Patient will sit at edge of bed for 2-3 seconds using compensatory strategies and without external support.  Skilled Therapeutic Interventions/Progress Updates:    Patient received supine in bed, awake.  Patient reports pain in jaw is much better.  Patient changed into clean and dry brief and dressed into pants.  Patient initially resistant to put on clothing, but agreeable with mild coaxing.  Patient rolled toward right side and sat at edge f bed with some improvement as footboard provided a physical barrier for her pushing.  Transferred to wheelchair with max assist, and completed oral care, and upper body bathing and dressing at sink.  Provided towel roll along left trunk to encourage more upright midline posture.  Patient left up in chair at end of session, reporting no pain, and with call bell, and personal items in reach.  Safety belt in place and engaged.    Therapy Documentation Precautions:  Precautions Precautions: Fall Recall of Precautions/Restrictions: Impaired Precaution/Restrictions Comments: Cortrak- NPO; dense L Hemi Restrictions Weight Bearing Restrictions Per Provider Order: No Pain: Pain Assessment Pain Scale: 0-10 Pain Score: 0-No pain    Therapy/Group: Individual Therapy  [name] M 07/17/2024, 12:54 PM

## 2024-07-17 NOTE — Patient Care Conference (Signed)
 Inpatient RehabilitationTeam Conference and Plan of Care Update Date: 07/16/2024   Time: 1114 am   Patient Name: [name]      Medical Record Number: [id number]  Date of Birth: 1932/04/04 Sex: Female         Room/Bed: 4W26C/4W26C-01 Payor Info: Payor: [employer] MEDICARE / Plan: [employer] MEDICARE CHOICE PPO / Product Type: *No Product type* /    Admit Date/Time:  07/08/2024  3:21 PM  Primary Diagnosis:  Acute ischemic right MCA stroke [facility])  Hospital Problems: Principal Problem:   Acute ischemic right MCA stroke [facility])    Expected Discharge Date: Expected Discharge Date: 07/30/24  Team Members Present: Physician leading conference: Dr. Sven Elks Social Worker Present: Rhoda Clement, LCSW Nurse Present: Eulalio Falls, RN PT Present: Sherlean Perks, PT OT Present: Monica Peacock, OT SLP Present: [name], SLP     Current Status/Progress Goal Weekly Team Focus  Bowel/Bladder   incontinent of bowel and bladder; last bm 7/29   no skin breakdown  related to incontinece   no skin breakdown related to incontinence    Swallow/Nutrition/ Hydration   NPO w/ PEG   modA  pharyngeal strengthening exercises, PO trials, EMST    ADL's   total assist lower body, max assist upper body   Mod assist   postural control, sitting balance, endurace, activity tolerance, participation with basic self care skills    Mobility   maxA bed mobility, maxA sitting balance, totalA transfers, non ambulatory   modA  bed mobility, functional transfers, sitting balance, activity tolerance, time OOB b/w therapy sessions    Communication                Safety/Cognition/ Behavioral Observations  mod-max A depending on fatigue   minA   short term memory, orientation to time w/ external aid, simple problem solving    Pain   patient complaining arthritis pain to back and mouth pain due to abscess on left side of mouth; prn given   continue with prn and scheduled pain medication;  continue with repositioning   minimize pain as much as possible    Skin   new gtube site; no further skin issues   continue with no further skin issues and monitor gtube site  continue with no skin issues      Discharge Planning:  Has hired caregiver and with children's assist plan for home with 24/7 care. Will confirm plan. Has PEG now placed 7/28    Team Discussion: Patient was admitted post right MCA stroke. Patient with UTI/ back pain/right Jaw pain:  medication adjusted by MD. Patient is NPO w/ peg tube.  Progress limited by poor endurance and activity tolerance.   Patient on target to meet rehab goals: Currently patient needs max assistance of upper body care and total assist with lower body care bed level. Patient needs max assist with transfers and patient is non ambulatory. Patient needs mod-max assist with memory, orientation, simple problem solving depending on fatigue level. Overall goals at discharge are set for mod assist.   *See Care Plan and progress notes for long and short-term goals.   Revisions to Treatment Plan:  D3 diet Lidocaine  patches Sleep chart Pharyngeal exercises EMST Dietician consult  Teaching Needs: Safety, Hoyer lift training, transfers, transitioning to bolus feeds, toileting, etc   Current Barriers to Discharge: Decreased caregiver support, Home enviroment access/layout, Incontinence, Weight, and peg placement   Possible Resolutions to Barriers: Family education      Medical Summary Current Status:  systolic hypertension and diastolic hypotension, dysphagia, low back pain, muscle spasms  Barriers to Discharge: Medical stability  Barriers to Discharge Comments: systolic hypertension and diastolic hypotension, dysphagia, low back pain, muscle spasms Possible Resolutions to [employer]: continue to monitor BP TID, PEG placed, will consult RD regarding transitioning to bolus feeds, lidocaine  patch ordered, tramadol  ordered, continue  scheduled tylenol  #3, baclofen  started HS   Continued Need for Acute Rehabilitation Level of Care: The patient requires daily medical management by a physician with specialized training in physical medicine and rehabilitation for the following reasons: Direction of a multidisciplinary physical rehabilitation program to maximize functional independence : Yes Medical management of patient stability for increased activity during participation in an intensive rehabilitation regime.: Yes Analysis of laboratory values and/or radiology reports with any subsequent need for medication adjustment and/or medical intervention. : Yes   I attest that I was present, lead the team conference, and concur with the assessment and plan of the team.   Glennice Marcos Gayo 07/16/2024, 1114 am

## 2024-07-17 NOTE — Progress Notes (Signed)
 PROGRESS NOTE   Subjective/Complaints: C/o that her mouth is always moving, asks why this is, she does have dry mouth, she has been using ice chips, messaged SLP to see if she can try sips  ROS: as per HPI. Denies CP, SOB, abd pain, n/v/d/c back pain improved, +muscle spasms, improved sleep, +tooth pain- not present at this time, +dry mouth   Objective:   No results found.   Recent Labs    07/17/24 0502  WBC 5.8  HGB 11.3*  HCT 33.4*  PLT 153    Recent Labs    07/17/24 0502  NA 138  K 3.8  CL 104  CO2 26  GLUCOSE 94  BUN 18  CREATININE 0.55  CALCIUM  9.0     Intake/Output Summary (Last 24 hours) at 07/17/2024 1152 Last data filed at 07/17/2024 1049 Gross per 24 hour  Intake 480 ml  Output --  Net 480 ml        Physical Exam: Vital Signs Blood pressure (!) 156/66, pulse 64, temperature 97.9 F (36.6 C), temperature source Oral, resp. rate 16, height 5' 1 (1.549 m), weight 67.8 kg, SpO2 96%.   Constitutional: No distress . Vital signs reviewed. Chronically ill appearing, sitting up in w/c in therapy gym HEENT: NCAT, L facial droop, oral membranes moist, cortrak in place, R lower molars decayed, unable to visualize teeth well but no definite abscess palpable through cheek, some tenderness to R jawline adjacent to poor dentitia Neck: supple Cardiovascular: bradycardia, reg rhythm without m/r/g appreciated. No JVD    Respiratory/Chest: CTA Bilaterally without wheezes or rales. Normal effort    GI/Abdomen: soft, +BS throughout, non-tender, non-distended Ext: no clubbing, cyanosis, or edema Psych: pleasant and cooperative, flat Skin: dry, warm HENT:     Head: Normocephalic and atraumatic.     Comments: Mild L facial droop Tongue midline  Mild to moderate facial redness- on R cheek    Right Ear: External ear normal.     Left Ear: External ear normal.     Nose: Nose normal. No congestion.      Comments: Cortrak- in nare    Mouth/Throat:     Mouth: Mucous membranes are dry.  Eyes:     Comments: Couldn't complete EOM's for me- but so sleepy  Musculoskeletal:     Cervical back: Neck supple.     Comments:  RUE 5-/5 LUE 0/5 throughout RLE- 4+/5 throughout- hard to get pt to do exam LLE- PF 1/5- otherwise 0/5 in LLE, stable 7/31 Skin:    General: Skin is warm and dry.     Comments: R forearm IV Some UE bruising  A lot of moles vs neurofibromas? On skin with actinic keratosis on multiple spots  Neurological:     Mental Status: She is alert.     Comments: Moderate dysarthria with occasional wet voice. Left facial weakness with left inattention. Left HH and needs cues to look to left. Able to smaller numbers on the clock. She was able to answer basic orientation questions, state age, DOB and PCP name.  Left hemiparesis with sensory deficits.  No clonus, no hoffman's Strong (+) Babinski Flaccid on L side  Psychiatric:  Comments: Alert and smiling   Assessment/Plan: 1. Functional deficits which require 3+ hours per day of interdisciplinary therapy in a comprehensive inpatient rehab setting. Physiatrist is providing close team supervision and 24 hour management of active medical problems listed below. Physiatrist and rehab team continue to assess barriers to discharge/monitor patient progress toward functional and medical goals  Care Tool:  Bathing    Body parts bathed by patient: Face, Chest   Body parts bathed by helper: Right arm, Left arm, Chest, Abdomen, Front perineal area, Buttocks, Right upper leg, Left upper leg, Right lower leg, Left lower leg     Bathing assist Assist Level: Total Assistance - Patient < 25%     Upper Body Dressing/Undressing Upper body dressing   What is the patient wearing?: Pull over shirt    Upper body assist Assist Level: Maximal Assistance - Patient 25 - 49%    Lower Body Dressing/Undressing Lower body dressing      What is the  patient wearing?: Pants     Lower body assist Assist for lower body dressing: Maximal Assistance - Patient 25 - 49%     Toileting Toileting    Toileting assist Assist for toileting: Dependent - Patient 0%     Transfers Chair/bed transfer  Transfers assist  Chair/bed transfer activity did not occur: Safety/medical concerns (did not get up)  Chair/bed transfer assist level: Total Assistance - Patient < 25%     Locomotion Ambulation   Ambulation assist   Ambulation activity did not occur: Safety/medical concerns          Walk 10 feet activity   Assist  Walk 10 feet activity did not occur: Safety/medical concerns        Walk 50 feet activity   Assist Walk 50 feet with 2 turns activity did not occur: Safety/medical concerns         Walk 150 feet activity   Assist Walk 150 feet activity did not occur: Safety/medical concerns         Walk 10 feet on uneven surface  activity   Assist Walk 10 feet on uneven surfaces activity did not occur: Safety/medical concerns         Wheelchair     Assist Is the patient using a wheelchair?: Yes Type of Wheelchair: Manual    Wheelchair assist level: Dependent - Patient 0%      Wheelchair 50 feet with 2 turns activity    Assist        Assist Level: Dependent - Patient 0%   Wheelchair 150 feet activity     Assist      Assist Level: Dependent - Patient 0%   Blood pressure (!) 156/66, pulse 64, temperature 97.9 F (36.6 C), temperature source Oral, resp. rate 16, height 5' 1 (1.549 m), weight 67.8 kg, SpO2 96%.  Medical Problem List and Plan: 1. Functional deficits secondary to R MCA stroke             -patient may  shower             -ELOS/Goals: 16-2 days- min-mod A             Continue CIR  D3 started, increase to 2,000U, metanx also ordered  Discussed with therapy initiating Hoyer lift training as patient wants to go home  2.  Impaired mobility: -DVT/anticoagulation:   Pharmaceutical: continue Lovenox  40mg  daily             -antiplatelet therapy: Continue ASA.  --Plavix  recommended  if no further procedures planned. Discussed plan for PEG on Monday  3. Muscle spasms in back: ccontinue lidocaine  patches. Tylenol  500mg  TID/HS, Baclofen  5mg  added HS. Voltaren  gel.   --sedated from Flexeril  5 mg/tramadol  50 mg this afternoon. Will decrease doses. Decrease prn Tramadol  to 50mg  q8H prn and flexeril  2.5mg  TID PRN  Continue kpad  4. Hospital induced delirium at night: daughter may stay over at night -Conitnue melatonin 5mg  at 8 pm tonight. Sleep wake chart to monitor sleep              -antipsychotic agents: N/A  5. Neuropsych/cognition: This patient may be intermittently capable of making decisions on her own behalf.  6. Daytime somnolence: modafinil  100mg  daily ordered  7. Dysphagia/dry mouth: Will transition from Cortrak to PEG feeds 7/29, transitioned to bolus feeds, continue ice chips  8.  E coli bacteremia: Treated with 10 day course of antibiotics. Ceftriaxone -->Duricef thru 07/14. Thrombocytopenia/leucocytosis has resolved.              --Hematuria has resolved. No dysuria.   9. Chronic back/Right knee pain: SEE #3 ABOVE; Continue voltaren  gel qid to knee and back till family bring in the biofreeze (which works better) --continue Lidocaine  patches at nights to help with back spasms. Decrease flexeril  to 2.5 mg every 8 hours prn             --schedule tylenol  500 mg qid as LFTs resolved.   10. Abnormal LFTs: Have resolved and Lipitor 10mg  daily added on 07/19. Continue to monitor weekly for  now.   11. COPD/OSA: Has CPAP but not using PTA. Continue Yupelri  nebs daily (in place of Spiriva).  --Continue albuterol  every night PTA.  .  12. Hypomagnesemia: Magnesium  reviewed and has normalized  13. Anemia: Monitor for signs of bleeding. H/H recovering from 13-->9.9-->11.2  14.  Bradycardia: HR note to be in 51 to 61 range and had boderline prolonged Qtc  on admission. Likely due to stroke.  Repeat EKG reviewed and is stable  15.  H/o MDD/anxiety: continue Lexapro  40mg  daily. Wellbutrin  started  16. Bowel management: Last BM 7/29, continue to monitor  17. R jaw/tooth pain: Continue Augmentin  BID x5 days to treat any potential dental infection; needs to f/up outpatient with a dentist eventually. Orthopanogram d/ced since pain has improved, Benzocaine  ordered      LOS: 9 days A FACE TO FACE EVALUATION WAS PERFORMED  Sven P Arilyn Brierley 07/17/2024, 11:52 AM

## 2024-07-17 NOTE — Progress Notes (Signed)
 Nutrition Follow-up  DOCUMENTATION CODES:   Non-severe (moderate) malnutrition in context of social or environmental circumstances (aging, decline in function, poor appetite)  INTERVENTION:   Continue bolus feedings via G-tube: Osmolite 1.5 237 ml QID Prosource TF20 60 ml daily Free water  flushes 60 ml before and after each bolus feeding. Monitor and adjust free water  as needed to maintain sodium level WNL. Provides: 1500 kcal, 80 gm protein, 1204 ml free water  daily  Continue MVI with minerals daily via tube.  NUTRITION DIAGNOSIS:   Moderate Malnutrition related to social / environmental circumstances (aging, decline in function, poor appetite) as evidenced by moderate fat depletion, mild muscle depletion; ongoing   GOAL:   Patient will meet greater than or equal to 90% of their needs; met with TF  MONITOR:   TF tolerance  REASON FOR ASSESSMENT:   Consult Enteral/tube feeding initiation and management, Assessment of nutrition requirement/status  ASSESSMENT:   Pt with PMH of HLD, PAD, COPD with emphysema, OSA, R breast ca s/p lumpectomy/XRT/hormone therapy admitted 7/5 for RUQ pain, N/V, and poor PO intake. Dx with acalculous cholecystitis and E.coli bacteremia. Recent admission 7/5 and discharged to [facility] 7/22  7/28: S/P 18 Fr G-tube placement by IR  Transitioned to gravity bolus feedings 7/29: Osmolite 1.5 237 ml QID with Prosource TF20 60 ml daily via G-tube. Tolerating well.  CT of jaw ordered today to r/o abscess.  Remains NPO.   Labs reviewed.   Medications reviewed and include vitamin D3, Metanx, MVI with minerals.  CIR admit weight: 67.2 kg Current weight: 67.8 kg  Diet Order:   Diet Order             Diet NPO time specified Except for: Ice Chips  Diet effective now                   EDUCATION NEEDS:   No education needs have been identified at this time  Skin:  Skin Assessment: Reviewed RN Assessment  Last BM:  7/31 type 7  Height:   Ht  Readings from Last 1 Encounters:  07/08/24 5' 1 (1.549 m)    Weight:   Wt Readings from Last 1 Encounters:  07/17/24 67.8 kg    Ideal Body Weight:  47.8 kg  BMI:  Body mass index is 28.24 kg/m.  Estimated Nutritional Needs:   Kcal:  1400-1600  Protein:  70-85g  Fluid:  1.4-1.6L   Suzen HUNT RD, LDN, CNSC Contact via secure chat. If unavailable, use group chat RD Inpatient.

## 2024-07-17 NOTE — Progress Notes (Signed)
 Patient in room reporting severe left jaw/tooth pain that feels like an abscess despite being on Augmentin  X 3 days.  On exam of jaw no erythema, edema or tenderness noted but pain reported to be under the gums. Orthopantogram is out of service. Will order CT of jaw to rule out abscess. SABRA

## 2024-07-17 NOTE — Plan of Care (Signed)
  Problem: Consults Goal: RH STROKE PATIENT EDUCATION Description: See Patient Education module for education specifics  Outcome: Progressing Goal: Nutrition Consult-if indicated Outcome: Progressing   Problem: RH BOWEL ELIMINATION Goal: RH STG MANAGE BOWEL WITH ASSISTANCE Description: STG Manage Bowel with min Assistance. Outcome: Progressing   Problem: RH BLADDER ELIMINATION Goal: RH STG MANAGE BLADDER WITH ASSISTANCE Description: STG Manage Bladder With min Assistance Outcome: Progressing   Problem: RH KNOWLEDGE DEFICIT Goal: RH STG INCREASE KNOWLEDGE OF DYSPHAGIA/FLUID INTAKE Description: Patient and family will be able to demonstrate understanding of dietary modifications to prevent complications related to stroke independently using handout provided.  Outcome: Progressing Goal: RH STG INCREASE KNOWLEGDE OF HYPERLIPIDEMIA Description: Patient and family will be able to demonstrate understanding of medication management and dietary modifications to better control cholesterol levels to prevent further occurrence of stroke independently using handout provided.  Outcome: Progressing Goal: RH STG INCREASE KNOWLEDGE OF STROKE PROPHYLAXIS Description: Patient and family will be able to demonstrate understanding of medication management and dietary modifications to better control cholesterol levels to prevent further occurrence of stroke independently using handout provided.  Outcome: Progressing   Problem: Education: Goal: Ability to describe self-care measures that may prevent or decrease complications (Diabetes Survival Skills Education) will improve Outcome: Progressing Goal: Individualized Educational Video(s) Outcome: Progressing   Problem: Coping: Goal: Ability to adjust to condition or change in health will improve Outcome: Progressing   Problem: Fluid Volume: Goal: Ability to maintain a balanced intake and output will improve Outcome: Progressing   Problem: Health  Behavior/Discharge Planning: Goal: Ability to identify and utilize available resources and services will improve Outcome: Progressing Goal: Ability to manage health-related needs will improve Outcome: Progressing   Problem: Metabolic: Goal: Ability to maintain appropriate glucose levels will improve Outcome: Progressing   Problem: Nutritional: Goal: Maintenance of adequate nutrition will improve Outcome: Progressing Goal: Progress toward achieving an optimal weight will improve Outcome: Progressing   Problem: Skin Integrity: Goal: Risk for impaired skin integrity will decrease Outcome: Progressing   Problem: Tissue Perfusion: Goal: Adequacy of tissue perfusion will improve Outcome: Progressing   Problem: RH Vision Goal: RH LTG Vision (Specify) Outcome: Progressing

## 2024-07-17 NOTE — Progress Notes (Signed)
 Physical Therapy Session Note  Patient Details  Name: [name] MRN: [id number] Date of Birth: 11-23-1932  Today's Date: 07/17/2024 PT Individual Time: 1300-1343 PT Individual Time Calculation (min): 43 min   Short Term Goals: Week 2:  PT Short Term Goal 1 (Week 2): Pt will complete bed mobility with maxA of 1 person PT Short Term Goal 2 (Week 2): Pt will complete bed<>chair transfers consistently at maxA level PT Short Term Goal 3 (Week 2): Pt will sit unsupported at EOB for 1 minute with modA  Skilled Therapeutic Interventions/Progress Updates:      Pt in bed resting, with her daughter Aleck at the bedside. Reviewed with daughter, patient's PT POC, PT goals, anticipated DME needs, and current level of functional mobility.   Patient assisted to EOB with maxA of 1 person with use of hospital bed features. She requires totalA for scooting to EOB to get feet on floor for adequate BOS. She presents with heavy L lean with pushing tendencies, needs maxA for static sitting balance. Squat pivot transfer completed with totalA from EOB into wheelchair, cues for hand placement, forward trunk leaning, and setup.   In day room gym, patient setup at standing frame. Needing manual facilitation for forward trunk lean in order to place and remove harness system. She was dependently stood in frame with assist for trunk support and LUE management. She was positioned into standing for 5 minutes per stand, working on postural control, lateral weight shifting, forward gaze, and visual scanning to her L.   Patient returned to her room, daughter requested nursing to change bed linen and waiting on that to be done before returning patient back to her bed. Seat belt alarm on, patient reclined, pillow supporting her LUE.   Therapy Documentation Precautions:  Precautions Precautions: Fall Recall of Precautions/Restrictions: Impaired Precaution/Restrictions Comments: Cortrak- NPO; dense L  Hemi Restrictions Weight Bearing Restrictions Per Provider Order: No General:      Therapy/Group: Individual Therapy  [name] 07/17/2024, 7:41 AM

## 2024-07-17 NOTE — Progress Notes (Signed)
 Speech Language Pathology Daily Session Note  Patient Details  Name: [name] MRN: [id number] Date of Birth: December 15, 1932  Today's Date: 07/17/2024 SLP Individual Time: 1001-1100 SLP Individual Time Calculation (min): 59 min  Short Term Goals: Week 2: SLP Short Term Goal 1 (Week 2): Pt will consume ice chips and teaspoons of thin liquid demonstrating reduction in frequency of overt s/sx of penetration/aspiration by at least 25% to indicate appropriateness for repeated objective swallow study. SLP Short Term Goal 2 (Week 2): Patient will recall daily events with 90% accuracy using internal/external memory aids given min assist. SLP Short Term Goal 3 (Week 2): Patient will complete pharyngeal strengthening exercises as appropriate given min A in order to improve swallow function  Skilled Therapeutic Interventions: Skilled therapy session focused on cognitive goals. Patient oriented to self, situation and location independently. Patient oriented to time given totalA due to L inattention for use of calendar. Patient also required total A to locate red call button on bed, though was able to verbalize how to contact nursing. SLP targeted dysphagia goals through prompting completion of chin tucks against resistance x25 and EMST set at 15cm H2O x15. SLP then provided set up A for oral care and offered ice chips and tsp sips of water . Patient with no s/sx of aspiration during ice chip trials, though immediate coughing after tsp sips of thin liquids. Patient refused further trials. Recommend continuation of NPO w/ medications, nutrition and hydration via PEG. Patient may consume ice chips after oral care with trained family and staff. Patient requested transfer from chair to bed stating I feel like if I stay in this chair any longer I am going to die. SLP and NT transferred patient to bed via maxi move and patient followed all single step directions independently. Patient left in bed with alarm set and  call bell in reach. Continue POC.  Pain Back and shoulder pain- transferred to bed   Therapy/Group: Individual Therapy  Domanic Matusek M.A., CCC-SLP 07/17/2024, 7:35 AM

## 2024-07-18 NOTE — Progress Notes (Signed)
 Speech Language Pathology Daily Session Note  Patient Details  Name: [name] MRN: [id number] Date of Birth: 29-May-1932  Today's Date: 07/18/2024 SLP Individual Time: 0730-0823 SLP Individual Time Calculation (min): 53 min  Short Term Goals: Week 2: SLP Short Term Goal 1 (Week 2): Pt will consume ice chips and teaspoons of thin liquid demonstrating reduction in frequency of overt s/sx of penetration/aspiration by at least 25% to indicate appropriateness for repeated objective swallow study. SLP Short Term Goal 2 (Week 2): Patient will recall daily events with 90% accuracy using internal/external memory aids given min assist. SLP Short Term Goal 3 (Week 2): Patient will complete pharyngeal strengthening exercises as appropriate given min A in order to improve swallow function  Skilled Therapeutic Interventions: SLP conducted skilled therapy session targeting dysphagia and cognitive goals. Patient awoke easily upon SLP arrival and completed oral care via suction toothbrush with setupA. Patient consumed small ice chips x5 and exhibited immediate throat clear and/or cough on 80% of trials. SLP facilitated completion of 3 sets of 5 repetitions of EMST set to [facility] with patient benefiting from min cues for accurate completion of exercise. Attempted to facilitate 5 sets, however patient refused despite strong encouragement. Patient then completed CTAR series x3 given 1 minute rest breaks between each set. Throughout all swallowing tasks, benefited from mod to max assist to attend to slightly left of midline. After completion of swallowing exercises, targeted left inattention and awareness through various scanning and basic problem solving tasks. Patient required mod to max assist throughout. Patient was left in room with call bell in reach and alarm set. SLP will continue to target goals per plan of care.        Pain Pain Assessment Pain Scale: 0-10 Pain Score: 9  Pain Location: Arm Pain  Orientation: Right Pain Intervention(s): Medication (See eMAR)  Therapy/Group: Individual Therapy  [name], M.A., CCC-SLP  Nykayla Marcelli A Cardell Rachel 07/18/2024, 8:25 AM

## 2024-07-18 NOTE — Progress Notes (Signed)
 PROGRESS NOTE   Subjective/Complaints: No new complaints this morning Jaw pain and dry mouth have improved, discussed that flexeril  may have been contributing to latter  ROS: as per HPI. Denies CP, SOB, abd pain, n/v/d/c back pain improved, +muscle spasms, improved sleep, +tooth pain- not present at this time, +dry mouth- improved with d/c of flexeril    Objective:   CT MAXILLOFACIAL WO CONTRAST Result Date: 07/17/2024 CLINICAL DATA:  pain in left jaw/rule out peridontal abscess EXAM: CT MAXILLOFACIAL WITHOUT CONTRAST TECHNIQUE: Multidetector CT imaging of the maxillofacial structures was performed. Multiplanar CT image reconstructions were also generated. RADIATION DOSE REDUCTION: This exam was performed according to the departmental dose-optimization program which includes automated exposure control, adjustment of the mA and/or kV according to patient size and/or use of iterative reconstruction technique. COMPARISON:  None Available. FINDINGS: Osseous: Patient is missing all of the upper teeth in most of the lower teeth with only a few with tubular teeth remaining. No evidence of periapical abscess or mandibular abnormality. No fracture. Orbits: Choose 1 Sinuses: Clear Soft tissues: Negative Limited intracranial: No significant or unexpected finding. IMPRESSION: No acute mandibular abnormality. No facial or orbital fracture. Electronically Signed   By: Franky Crease M.D.   On: 07/17/2024 17:19     Recent Labs    07/17/24 0502  WBC 5.8  HGB 11.3*  HCT 33.4*  PLT 153    Recent Labs    07/17/24 0502  NA 138  K 3.8  CL 104  CO2 26  GLUCOSE 94  BUN 18  CREATININE 0.55  CALCIUM  9.0     Intake/Output Summary (Last 24 hours) at 07/18/2024 1540 Last data filed at 07/18/2024 1203 Gross per 24 hour  Intake 480 ml  Output --  Net 480 ml        Physical Exam: Vital Signs Blood pressure (!) 149/64, pulse 78, temperature 98.7  F (37.1 C), temperature source Oral, resp. rate 18, height 5' 1 (1.549 m), weight 63.4 kg, SpO2 98%.   Constitutional: No distress . Vital signs reviewed. Chronically ill appearing, sitting up in w/c in therapy gym HEENT: NCAT, L facial droop, oral membranes moist, cortrak in place, R lower molars decayed, unable to visualize teeth well but no definite abscess palpable through cheek, some tenderness to R jawline adjacent to poor dentitia Neck: supple Cardiovascular: bradycardia, reg rhythm without m/r/g appreciated. No JVD    Respiratory/Chest: CTA Bilaterally without wheezes or rales. Normal effort    GI/Abdomen: soft, +BS throughout, non-tender, non-distended Ext: no clubbing, cyanosis, or edema Psych: pleasant and cooperative, flat Skin: dry, warm HENT:     Head: Normocephalic and atraumatic.     Comments: Mild L facial droop Tongue midline  Mild to moderate facial redness- on R cheek    Right Ear: External ear normal.     Left Ear: External ear normal.     Nose: Nose normal. No congestion.     Comments: Cortrak- in nare    Mouth/Throat:     Mouth: Mucous membranes are dry.  Eyes:     Comments: Couldn't complete EOM's for me- but so sleepy  Musculoskeletal:     Cervical back: Neck supple.  Comments:  RUE 5-/5 LUE 0/5 throughout RLE- 4+/5 throughout- hard to get pt to do exam LLE- PF 1/5- otherwise 0/5 in LLE, stable 8/1 Skin:    General: Skin is warm and dry.     Comments: R forearm IV Some UE bruising  A lot of moles vs neurofibromas? On skin with actinic keratosis on multiple spots  Neurological:     Mental Status: She is alert.     Comments: Moderate dysarthria with occasional wet voice. Left facial weakness with left inattention. Left HH and needs cues to look to left. Able to smaller numbers on the clock. She was able to answer basic orientation questions, state age, DOB and PCP name.  Left hemiparesis with sensory deficits.  No clonus, no hoffman's Strong (+)  Babinski Flaccid on L side  Psychiatric:     Comments: Alert and smiling   Assessment/Plan: 1. Functional deficits which require 3+ hours per day of interdisciplinary therapy in a comprehensive inpatient rehab setting. Physiatrist is providing close team supervision and 24 hour management of active medical problems listed below. Physiatrist and rehab team continue to assess barriers to discharge/monitor patient progress toward functional and medical goals  Care Tool:  Bathing    Body parts bathed by patient: Face, Chest, Left arm, Abdomen   Body parts bathed by helper: Right arm, Buttocks, Front perineal area, Right lower leg, Left lower leg, Right upper leg, Left upper leg     Bathing assist Assist Level: Maximal Assistance - Patient 24 - 49%     Upper Body Dressing/Undressing Upper body dressing   What is the patient wearing?: Pull over shirt    Upper body assist Assist Level: Moderate Assistance - Patient 50 - 74%    Lower Body Dressing/Undressing Lower body dressing      What is the patient wearing?: Pants, Incontinence brief     Lower body assist Assist for lower body dressing: Total Assistance - Patient < 25%     Toileting Toileting    Toileting assist Assist for toileting: 2 Helpers     Transfers Chair/bed transfer  Transfers assist  Chair/bed transfer activity did not occur: Safety/medical concerns (did not get up)  Chair/bed transfer assist level: Maximal Assistance - Patient 25 - 49%     Locomotion Ambulation   Ambulation assist   Ambulation activity did not occur: Safety/medical concerns          Walk 10 feet activity   Assist  Walk 10 feet activity did not occur: Safety/medical concerns        Walk 50 feet activity   Assist Walk 50 feet with 2 turns activity did not occur: Safety/medical concerns         Walk 150 feet activity   Assist Walk 150 feet activity did not occur: Safety/medical concerns         Walk 10  feet on uneven surface  activity   Assist Walk 10 feet on uneven surfaces activity did not occur: Safety/medical concerns         Wheelchair     Assist Is the patient using a wheelchair?: Yes Type of Wheelchair: Manual    Wheelchair assist level: Dependent - Patient 0%      Wheelchair 50 feet with 2 turns activity    Assist        Assist Level: Dependent - Patient 0%   Wheelchair 150 feet activity     Assist      Assist Level: Dependent - Patient 0%  Blood pressure (!) 149/64, pulse 78, temperature 98.7 F (37.1 C), temperature source Oral, resp. rate 18, height 5' 1 (1.549 m), weight 63.4 kg, SpO2 98%.  Medical Problem List and Plan: 1. Functional deficits secondary to R MCA stroke             -patient may  shower             -ELOS/Goals: 16-2 days- min-mod A             Continue CIR  D3 started, increase to 2,000U, metanx also ordered  Discussed with therapy initiating Hoyer lift training as patient wants to go home  2.  Impaired mobility: -DVT/anticoagulation:  Pharmaceutical: continue Lovenox  40mg  daily             -antiplatelet therapy: Continue ASA.  --Plavix  recommended if no further procedures planned. PEG placed without complications  3. Muscle spasms in back: ccontinue lidocaine  patches. Tylenol  500mg  TID/HS, Baclofen  5mg  added. Voltaren  gel.   --sedated from Flexeril  5 mg/tramadol  50 mg. Will decrease doses. Decrease prn Tramadol  to 50mg  q8H prn, d/c flexeril   Continue kpad  4. Hospital induced delirium at night: daughter may stay over at night -Conitnue melatonin 5mg  at 8 pm tonight. Sleep wake chart to monitor sleep              -antipsychotic agents: N/A  5. Neuropsych/cognition: This patient may be intermittently capable of making decisions on her own behalf.  6. Daytime somnolence: modafinil  100mg  daily ordered  7. Dysphagia/dry mouth: Will transition from Cortrak to PEG feeds 7/29, transitioned to bolus feeds, continue ice  chips  8.  E coli bacteremia: Treated with 10 day course of antibiotics. Ceftriaxone -->Duricef thru 07/14. Thrombocytopenia/leucocytosis has resolved.              --Hematuria has resolved. No dysuria.   9. Chronic back/Right knee pain: SEE #3 ABOVE; Continue voltaren  gel qid to knee and back till family bring in the biofreeze (which works better) --continue Lidocaine  patches at nights to help with back spasms. Decrease flexeril  to 2.5 mg every 8 hours prn             --schedule tylenol  500 mg qid as LFTs resolved.   10. Abnormal LFTs: Have resolved and Lipitor 10mg  daily added on 07/19. Continue to monitor weekly for  now.   11. COPD/OSA: Has CPAP but not using PTA. Continue Yupelri  nebs daily (in place of Spiriva).  --continue albuterol  every night PTA.  .  12. Hypomagnesemia: Magnesium  reviewed and has normalized  13. Anemia: Monitor for signs of bleeding. H/H recovering from 13-->9.9-->11.2  14.  Bradycardia: HR note to be in 51 to 61 range and had boderline prolonged Qtc on admission. Likely due to stroke.  Repeat EKG reviewed and is stable  15.  H/o MDD/anxiety: continue Lexapro  40mg  daily. Wellbutrin  started 75mg  BID  16. Bowel management: Last BM 8/1, continue to monitor, d/c suppository  17. R jaw/tooth pain: Continue Augmentin  BID x5 days to treat any potential dental infection; needs to f/up outpatient with a dentist eventually. Orthopanogram d/ced since pain has improved, Benzocaine  ordered  18. Dry mouth: d/c flexeril       LOS: 10 days A FACE TO FACE EVALUATION WAS PERFORMED  Sven SQUIBB Fulton Merry 07/18/2024, 3:40 PM

## 2024-07-18 NOTE — Progress Notes (Signed)
 Physical Therapy Session Note  Patient Details  Name: [name] MRN: [id number] Date of Birth: 1932/05/09  Today's Date: 07/18/2024 PT Individual Time: 1110-1158 + [id number] PT Individual Time Calculation (min): 48 min + 41 min  Short Term Goals: Week 2:  PT Short Term Goal 1 (Week 2): Pt will complete bed mobility with maxA of 1 person PT Short Term Goal 2 (Week 2): Pt will complete bed<>chair transfers consistently at maxA level PT Short Term Goal 3 (Week 2): Pt will sit unsupported at EOB for 1 minute with modA  Skilled Therapeutic Interventions/Progress Updates:      1st session: Pt sitting in tilted wheelchair on arrival - pt agreeable to therapy treatment with encouragement; reports feeling fatigued from having a busy morning. Pt has no complaints of pain during treatment.   Transported to main gym at wheelchair level. Completed totalA (pt <25%) squat pivot transfer from wheelchair to mat table. Patient requiring maxA for static sitting balance due to L pushing and absent righting and delayed protective responses. Worked on sitting balance for duration of session with different strategies to promote midline and improve her R lateral lean. Used mirror for visual aid, targets for reaching, back support for comfort, and multi-modal cueing to facilitate forward and R trunk leaning. She required maxA for duration of sitting time.   Patient was returned to her room at end of treatment and left sitting/reclined in her wheelchair. Her needs met, seat belt alarm on.    2nd session: Pt presents sitting up in wheelchair, agreeable to PT treatment.4 Pt has no reports of pain.  Transported to main gym and setup outside // bars to begin sit<>stand training. Once setup and positioned, patient reporting urgent need for the bathroom.   Returned to her room and setup at the toilet. She completed a maxA stand pivot transfer with +2 assist for managing her pants/brief in standing. MaxA for  standing balance/support. Patient positioned on the toilet and needed assist for balance due to her L trunk lean/pushing. Pt continent x2 of large loose stool.   Pt needing +2 totalA for transferring safely back into the wheelchair, pt slumped and sliding out of the chair needing +2 for repositioning for safety. Patient returned to bed and completed pericare at bed level. Boosted to [facility], repositioned, needs met.       Therapy Documentation Precautions:  Precautions Precautions: Fall Recall of Precautions/Restrictions: Impaired Precaution/Restrictions Comments: Cortrak- NPO; dense L Hemi Restrictions Weight Bearing Restrictions Per Provider Order: No General:     Therapy/Group: Individual Therapy  Mossie Gilder P Dannielle Baskins 07/18/2024, 12:02 PM

## 2024-07-18 NOTE — Progress Notes (Signed)
 Patient ID: [name], female   DOB: 1932/03/04, 89 y.o.   MRN: [id number] Two other daughter's here to visit-Lisa and Ginny all will discuss best plan for Mom and decide if doable caring for her at home. All hopeful she will do well here and today is doing better than yesterday, which is encouraging

## 2024-07-18 NOTE — Progress Notes (Signed)
 Occupational Therapy Session Note  Patient Details  Name: [name] MRN: [id number] Date of Birth: August 03, 1932  Today's Date: 07/18/2024 OT Individual Time: 9154-9067 OT Individual Time Calculation (min): 47 min    Short Term Goals: Week 2:  OT Short Term Goal 1 (Week 2): Patient will actively lean forward in midline while sitting to prepare for sit to stand OT Short Term Goal 2 (Week 2): Patient will locate left arm on body in functional context - during bathing and dressing OT Short Term Goal 3 (Week 2): Patient will sit at edge of bed for 2-3 seconds using compensatory strategies and without external support.  Skilled Therapeutic Interventions/Progress Updates:   Patient received supine in bed asleep.  Difficult initially to arouse - but did wake with increased time and gentle stimulation.  Patient assisted to dress lower body in bed, then up to wheelchair with max assist.  Patient able to sit at edge of bed with R hand on bed rail x 10 seconds and close supervision!  Patient indicated need to void, and had continent void on toilet - max assist transfer, +2 for clothing management and hygiene.  Patient pleased with her ability to safely sit on toilet.  She feels tube feeding is leading to diarrhea.  Two daughters here and asking if patient could go downstairs to meet great grandchild in the next week.  Will pursue grounds pass.  Patient left up in wheelchair with family present.    Therapy Documentation Precautions:  Precautions Precautions: Fall Recall of Precautions/Restrictions: Impaired Precaution/Restrictions Comments: Cortrak- NPO; dense L Hemi Restrictions Weight Bearing Restrictions Per Provider Order: No   Pain: Pain Assessment Pain Scale: 0-10 Pain Score: unscored - mild - jaw Repositioned, and provided therapeutic distraction        Therapy/Group: Individual Therapy  Kyuss Hale M 07/18/2024, 9:47 AM

## 2024-07-19 MED ORDER — BUSPIRONE HCL 10 MG PO TABS
5.0000 mg | ORAL_TABLET | Freq: Three times a day (TID) | ORAL | Status: DC | PRN
  Administered 2024-07-19 – 2024-07-22 (×4): 5 mg
  Filled 2024-07-19 (×4): qty 1

## 2024-07-19 NOTE — Progress Notes (Signed)
 Physical Therapy Session Note  Patient Details  Name: [name] MRN: [id number] Date of Birth: 09/15/1932  Today's Date: 07/19/2024 PT Individual Time: 1015-1055 PT Individual Time Calculation (min): 40 min   Short Term Goals: Week 1:  PT Short Term Goal 1 (Week 1): Pt will iniate sitting balance with training with L sided wedge PT Short Term Goal 1 - Progress (Week 1): Not met PT Short Term Goal 2 (Week 1): Pt will attempt sit to stand training PT Short Term Goal 2 - Progress (Week 1): Not met PT Short Term Goal 3 (Week 1): Pt will attempt slide board transfer training to explore transfer options PT Short Term Goal 3 - Progress (Week 1): Not met PT Short Term Goal 4 (Week 1): Pt will continue bed mobility training focusing on reducing caregiver burden PT Short Term Goal 4 - Progress (Week 1): Met Week 2:  PT Short Term Goal 1 (Week 2): Pt will complete bed mobility with maxA of 1 person PT Short Term Goal 2 (Week 2): Pt will complete bed<>chair transfers consistently at maxA level PT Short Term Goal 3 (Week 2): Pt will sit unsupported at EOB for 1 minute with modA  Skilled Therapeutic Interventions/Progress Updates:   Received pt semi-reclined in bed with multiple family members at bedside. Pt agreeable to PT treatment with encouragement and denied any pain during session. Session with emphasis on functional mobility/transfers, global strengthening and endurance, sitting balance, midline orientation/L attention, and standing balance/coordination. Removed PRAFO and pt reported urge to void. Due to urgency, retrieved female urinal and with increased time pt able to void. Performed hygiene management with supervision and cues for attention to task as pt easily distracted, then perseverating on drying her leg. Rolled L/R with max A to don clean brief and pt declined wearing pants/shirt, requesting to wear robe over nightgown. Pt transferred semi-reclined<>sitting L EOB with HOB elevated and  use of bedrails with max A with cues for sequencing. Pt pushing L and posteriorly, initially requiring max A for sitting balance but when holding onto footboard was able to maintain balance with close supervision. Pt randomly letting go on rail on purpose stating I just wanted to let go and fall back.  Stood in [place] from [place] with +2 assist with minimal initiation from pt. Pt pushing L in standing and with poor carry over of cues for correction. Pt then reported feeling like passing out - transferred to TIS WC dependently in [place], then required +2 assist to transition from semi-standing<>standing<>sitting in TIS WC. BP seated in WC 136/65 and HR 76bpm - pt reported feeling better. Sat in WC at sink and combed hair with supervision but required max step by step cues to wash face as pt distracted and wanting to play in the water . Concluded session with pt reclined in TIS WC, needs within reach, and seatbelt alarm on. LUE supported on pillow for improved hemibody positioning.  Therapy Documentation Precautions:  Precautions Precautions: Fall Recall of Precautions/Restrictions: Impaired Precaution/Restrictions Comments: Cortrak- NPO; dense L Hemi Restrictions Weight Bearing Restrictions Per Provider Order: No  Therapy/Group: Individual Therapy Therisa HERO Zaunegger Therisa Stains PT, DPT 07/19/2024, 7:07 AM

## 2024-07-19 NOTE — Plan of Care (Signed)
  Problem: Consults Goal: RH STROKE PATIENT EDUCATION Description: See Patient Education module for education specifics  Outcome: Progressing Goal: Nutrition Consult-if indicated Outcome: Progressing   Problem: RH BOWEL ELIMINATION Goal: RH STG MANAGE BOWEL WITH ASSISTANCE Description: STG Manage Bowel with min Assistance. Outcome: Progressing   Problem: RH BLADDER ELIMINATION Goal: RH STG MANAGE BLADDER WITH ASSISTANCE Description: STG Manage Bladder With min Assistance Outcome: Progressing   Problem: RH KNOWLEDGE DEFICIT Goal: RH STG INCREASE KNOWLEDGE OF DYSPHAGIA/FLUID INTAKE Description: Patient and family will be able to demonstrate understanding of dietary modifications to prevent complications related to stroke independently using handout provided.  Outcome: Progressing Goal: RH STG INCREASE KNOWLEGDE OF HYPERLIPIDEMIA Description: Patient and family will be able to demonstrate understanding of medication management and dietary modifications to better control cholesterol levels to prevent further occurrence of stroke independently using handout provided.  Outcome: Progressing Goal: RH STG INCREASE KNOWLEDGE OF STROKE PROPHYLAXIS Description: Patient and family will be able to demonstrate understanding of medication management and dietary modifications to better control cholesterol levels to prevent further occurrence of stroke independently using handout provided.  Outcome: Progressing   Problem: Education: Goal: Ability to describe self-care measures that may prevent or decrease complications (Diabetes Survival Skills Education) will improve Outcome: Progressing Goal: Individualized Educational Video(s) Outcome: Progressing   Problem: Coping: Goal: Ability to adjust to condition or change in health will improve Outcome: Progressing   Problem: Fluid Volume: Goal: Ability to maintain a balanced intake and output will improve Outcome: Progressing   Problem: Health  Behavior/Discharge Planning: Goal: Ability to identify and utilize available resources and services will improve Outcome: Progressing Goal: Ability to manage health-related needs will improve Outcome: Progressing   Problem: RH Vision Goal: RH LTG Vision (Specify) Outcome: Progressing

## 2024-07-19 NOTE — Progress Notes (Signed)
 Occupational Therapy Session Note  Patient Details  Name: [name] MRN: [id number] Date of Birth: December 03, 1932  Today's Date: 07/19/2024 OT Individual Time: 0106-0215 OT Individual Time Calculation (min): 69 min    Short Term Goals: Week 1:  OT Short Term Goal 1 (Week 1): Patient will sit with min assist x 5 seconds as needed prior to transfer OT Short Term Goal 1 - Progress (Week 1): Met OT Short Term Goal 2 (Week 1): Patient will actively lean forward in midline while sitting to prepare for sit to stand OT Short Term Goal 2 - Progress (Week 1): Not met OT Short Term Goal 3 (Week 1): Patient will visually locate item 15* left of midline with mod cueing OT Short Term Goal 3 - Progress (Week 1): Met OT Short Term Goal 4 (Week 1): Patient will locate left arm on body in functional context - during bathing and dressing OT Short Term Goal 4 - Progress (Week 1): Progressing toward goal  Skilled Therapeutic Interventions/Progress Updates:    Patient was seated at w/c LOF at the time of arrival with visitors present. The patient indicated that she had been sitting for a while and was a little tired and that she had a tooth ache. The pt agreed to work on functional transfers and core strength to improve upon gains with sitting balance and transfers to all surfaces. The pt was able to transfer from w/c LOF to EOB at [facility] using the pad for adjustments in position. The pt presented with a strong L lean resulting in challenges  for maintaining good anatomical positioning in sit. The pt was able to transfer from EOB to supine  in bed at MaxAx 2.  The  pt  was able to roll from supine to the R and L for placement of the protective pad at MaxAx 2.  The pt was assisted with long sitting by holding on to  the R bed rails.  The pt was asked to complete the task 7x  to improve her ability to come in to sit and  to maintain upright positioning.  The pt was  able to maintain position for a count of  4 to  5  a  total of 6xs, she was able to maintain position for a count of 7 just  1x.  Nursing  came in during the session to address the patient's pain response by applying a topical pain medication to her tooth  with the patient indicating some relief. At the end of the session, the pt remained at bed LOF with the call light and bedside table within reach and all additional needs addressed prior to exiting the room.  Therapy Documentation Precautions:  Precautions Precautions: Fall Recall of Precautions/Restrictions: Impaired Precaution/Restrictions Comments: Cortrak- NPO; dense L Hemi Restrictions Weight Bearing Restrictions Per Provider Order: No   Therapy/Group: Individual Therapy  [name] 07/19/2024, 3:38 PM

## 2024-07-19 NOTE — Progress Notes (Signed)
 PROGRESS NOTE   Subjective/Complaints:  Pt slept ok, pain well managed and jaw pain better. LBM yesterday, had diarrhea, so far none today which she's happy with. Urinating fine. No other complaints or concerns.   ROS: as per HPI. Denies CP, SOB, abd pain, n/v/d/c back pain improved, +muscle spasms, improved sleep, +tooth pain- not present at this time, +dry mouth- improved with d/c of flexeril    Objective:   CT MAXILLOFACIAL WO CONTRAST Result Date: 07/17/2024 CLINICAL DATA:  pain in left jaw/rule out peridontal abscess EXAM: CT MAXILLOFACIAL WITHOUT CONTRAST TECHNIQUE: Multidetector CT imaging of the maxillofacial structures was performed. Multiplanar CT image reconstructions were also generated. RADIATION DOSE REDUCTION: This exam was performed according to the departmental dose-optimization program which includes automated exposure control, adjustment of the mA and/or kV according to patient size and/or use of iterative reconstruction technique. COMPARISON:  None Available. FINDINGS: Osseous: Patient is missing all of the upper teeth in most of the lower teeth with only a few with tubular teeth remaining. No evidence of periapical abscess or mandibular abnormality. No fracture. Orbits: Choose 1 Sinuses: Clear Soft tissues: Negative Limited intracranial: No significant or unexpected finding. IMPRESSION: No acute mandibular abnormality. No facial or orbital fracture. Electronically Signed   By: Franky Crease M.D.   On: 07/17/2024 17:19     Recent Labs    07/17/24 0502  WBC 5.8  HGB 11.3*  HCT 33.4*  PLT 153    Recent Labs    07/17/24 0502  NA 138  K 3.8  CL 104  CO2 26  GLUCOSE 94  BUN 18  CREATININE 0.55  CALCIUM  9.0     Intake/Output Summary (Last 24 hours) at 07/19/2024 1212 Last data filed at 07/19/2024 0854 Gross per 24 hour  Intake 360 ml  Output --  Net 360 ml        Physical Exam: Vital Signs Blood  pressure 116/70, pulse 75, temperature 98.5 F (36.9 C), resp. rate 18, height 5' 1 (1.549 m), weight 65 kg, SpO2 99%.   Constitutional: No distress . Vital signs reviewed. Chronically ill appearing, resting in bed HEENT: NCAT, L facial droop, oral membranes moist Neck: supple Cardiovascular: reg rate, reg rhythm without m/r/g appreciated. No JVD    Respiratory/Chest: CTA Bilaterally without wheezes or rales. Normal effort    GI/Abdomen: soft, +BS throughout, non-tender, non-distended Ext: no clubbing, cyanosis, or edema Psych: pleasant and cooperative, flat Skin: dry, warm  PRIOR EXAMS: HENT:     Head: Normocephalic and atraumatic.     Comments: Mild L facial droop Tongue midline  Mild to moderate facial redness- on R cheek    Right Ear: External ear normal.     Left Ear: External ear normal.     Nose: Nose normal. No congestion.     Comments: Cortrak- in nare    Mouth/Throat:     Mouth: Mucous membranes are dry.  Eyes:     Comments: Couldn't complete EOM's for me- but so sleepy  Musculoskeletal:     Cervical back: Neck supple.     Comments:  RUE 5-/5 LUE 0/5 throughout RLE- 4+/5 throughout- hard to get pt to do exam LLE- PF  1/5- otherwise 0/5 in LLE, stable 8/1 Skin:    General: Skin is warm and dry.     Comments: R forearm IV Some UE bruising  A lot of moles vs neurofibromas? On skin with actinic keratosis on multiple spots  Neurological:     Mental Status: She is alert.     Comments: Moderate dysarthria with occasional wet voice. Left facial weakness with left inattention. Left HH and needs cues to look to left. Able to smaller numbers on the clock. She was able to answer basic orientation questions, state age, DOB and PCP name.  Left hemiparesis with sensory deficits.  No clonus, no hoffman's Strong (+) Babinski Flaccid on L side  Psychiatric:     Comments: Alert and smiling   Assessment/Plan: 1. Functional deficits which require 3+ hours per day of  interdisciplinary therapy in a comprehensive inpatient rehab setting. Physiatrist is providing close team supervision and 24 hour management of active medical problems listed below. Physiatrist and rehab team continue to assess barriers to discharge/monitor patient progress toward functional and medical goals  Care Tool:  Bathing    Body parts bathed by patient: Face, Chest, Left arm, Abdomen   Body parts bathed by helper: Right arm, Buttocks, Front perineal area, Right lower leg, Left lower leg, Right upper leg, Left upper leg     Bathing assist Assist Level: Maximal Assistance - Patient 24 - 49%     Upper Body Dressing/Undressing Upper body dressing   What is the patient wearing?: Pull over shirt    Upper body assist Assist Level: Moderate Assistance - Patient 50 - 74%    Lower Body Dressing/Undressing Lower body dressing      What is the patient wearing?: Pants, Incontinence brief     Lower body assist Assist for lower body dressing: Total Assistance - Patient < 25%     Toileting Toileting    Toileting assist Assist for toileting: 2 Helpers     Transfers Chair/bed transfer  Transfers assist  Chair/bed transfer activity did not occur: Safety/medical concerns (did not get up)  Chair/bed transfer assist level: Maximal Assistance - Patient 25 - 49%     Locomotion Ambulation   Ambulation assist   Ambulation activity did not occur: Safety/medical concerns          Walk 10 feet activity   Assist  Walk 10 feet activity did not occur: Safety/medical concerns        Walk 50 feet activity   Assist Walk 50 feet with 2 turns activity did not occur: Safety/medical concerns         Walk 150 feet activity   Assist Walk 150 feet activity did not occur: Safety/medical concerns         Walk 10 feet on uneven surface  activity   Assist Walk 10 feet on uneven surfaces activity did not occur: Safety/medical concerns          Wheelchair     Assist Is the patient using a wheelchair?: Yes Type of Wheelchair: Manual    Wheelchair assist level: Dependent - Patient 0%      Wheelchair 50 feet with 2 turns activity    Assist        Assist Level: Dependent - Patient 0%   Wheelchair 150 feet activity     Assist      Assist Level: Dependent - Patient 0%   Blood pressure 116/70, pulse 75, temperature 98.5 F (36.9 C), resp. rate 18, height 5' 1 (1.549  m), weight 65 kg, SpO2 99%.  Medical Problem List and Plan: 1. Functional deficits secondary to R MCA stroke             -patient may  shower             -ELOS/Goals: 16-20 days- min-mod A             Continue CIR  D3 started, increase to 2,000U, metanx also ordered Discussed with therapy initiating [place] lift training as patient wants to go home  2.  Impaired mobility: -DVT/anticoagulation:  Pharmaceutical: continue Lovenox  40mg  daily             -antiplatelet therapy: Continue ASA.  --Plavix  recommended if no further procedures planned. PEG placed without complications  3. Muscle spasms in back: ccontinue lidocaine  patches. Tylenol  500mg  TID/HS, Baclofen  5mg  added. Voltaren  gel.   --sedated from Flexeril  5 mg/tramadol  50 mg. Will decrease doses. Decrease prn Tramadol  to 50mg  q8H prn, d/c flexeril   Continue kpad  -d/c'd flexeril   4. Hospital induced delirium at night: daughter may stay over at night -Conitnue melatonin 5mg  at 8 pm tonight. Sleep wake chart to monitor sleep              -antipsychotic agents: N/A  5. Neuropsych/cognition: This patient may be intermittently capable of making decisions on her own behalf.  6. Daytime somnolence: modafinil  100mg  daily ordered  7. Dysphagia/dry mouth: Will transition from Cortrak to PEG feeds 7/29, transitioned to bolus feeds, continue ice chips  8.  E coli bacteremia: Treated with 10 day course of antibiotics. Ceftriaxone -->Duricef thru 07/14. Thrombocytopenia/leucocytosis has  resolved.              --Hematuria has resolved. No dysuria.   9. Chronic back/Right knee pain: SEE #3 ABOVE; Continue voltaren  gel qid to knee and back till family bring in the biofreeze (which works better) --continue Lidocaine  patches at nights to help with back spasms. Decrease flexeril  to 2.5 mg every 8 hours prn-- now d/c'd as below             --schedule tylenol  500 mg qid as LFTs resolved.   10. Abnormal LFTs: Have resolved and Lipitor 10mg  daily added on 07/19. Continue to monitor weekly for  now.   11. COPD/OSA: Has CPAP but not using PTA. Continue Yupelri  nebs daily (in place of Spiriva).  --continue albuterol  every night PTA.  .  12. Hypomagnesemia: Magnesium  reviewed and has normalized  13. Anemia: Monitor for signs of bleeding. H/H recovering from 13-->9.9-->11.2  14.  Bradycardia: HR note to be in 51 to 61 range and had boderline prolonged Qtc on admission. Likely due to stroke.  Repeat EKG reviewed and is stable  15.  H/o MDD/anxiety: continue Lexapro  40mg  daily. Wellbutrin  started 75mg  BID  16. Bowel management: Last BM 8/1, continue to monitor, d/c suppository  17. R jaw/tooth pain: Continue Augmentin  BID x5 days to treat any potential dental infection; needs to f/up outpatient with a dentist eventually. Orthopanogram d/ced since pain has improved, Benzocaine  ordered -CT maxillofacial neg on 7/31  18. Dry mouth: d/c flexeril       LOS: 11 days A FACE TO FACE EVALUATION WAS PERFORMED  [address] 07/19/2024, 12:12 PM

## 2024-07-19 NOTE — Progress Notes (Signed)
 Physical Therapy Session Note  Patient Details  Name: [name] MRN: [id number] Date of Birth: 1932/02/27  Today's Date: 07/19/2024 PT Individual Time: 1415-1440 PT Individual Time Calculation (min): 25 min   Short Term Goals: Week 1:  PT Short Term Goal 1 (Week 1): Pt will iniate sitting balance with training with L sided wedge PT Short Term Goal 1 - Progress (Week 1): Not met PT Short Term Goal 2 (Week 1): Pt will attempt sit to stand training PT Short Term Goal 2 - Progress (Week 1): Not met PT Short Term Goal 3 (Week 1): Pt will attempt slide board transfer training to explore transfer options PT Short Term Goal 3 - Progress (Week 1): Not met PT Short Term Goal 4 (Week 1): Pt will continue bed mobility training focusing on reducing caregiver burden PT Short Term Goal 4 - Progress (Week 1): Met  Skilled Therapeutic Interventions/Progress Updates:  Pt was seen bedside in the pm. Pt exhausted but willing to participate with LE exercises. AAROM R LE and AAROM L hip and PROM L knee, 2 sets x 10 reps each, heel slides. Hip abd/add, SAQs. Pt moved up in bed with total A. Pt rolled into R side lying with max A and verbal cues. Pt left sitting up in bed with bed alarm and all needs within reach.   Therapy Documentation Precautions:  Precautions Precautions: Fall Recall of Precautions/Restrictions: Impaired Precaution/Restrictions Comments: Cortrak- NPO; dense L Hemi Restrictions Weight Bearing Restrictions Per Provider Order: No General:   Pain: No c/o pain  Therapy/Group: Individual Therapy  [name] 07/19/2024, 2:38 PM

## 2024-07-20 MED ORDER — BACLOFEN 5 MG/5ML PO SOLN
2.5000 mg | Freq: Three times a day (TID) | ORAL | Status: DC | PRN
  Administered 2024-07-21: 2.5 mg
  Filled 2024-07-20 (×3): qty 2.5

## 2024-07-20 MED ORDER — BACLOFEN 25 MG/5ML PO SUSP
2.5000 mg | Freq: Three times a day (TID) | ORAL | Status: DC | PRN
Start: 2024-07-20 — End: 2024-07-20
  Filled 2024-07-20: qty 0.5

## 2024-07-20 NOTE — Progress Notes (Signed)
 Per NT, family requested vitals be taken at a later time so as to not wake patient.  Oncoming shift made aware of families request. Pt currently in bed, eyes closed even and unlabored breathing.

## 2024-07-20 NOTE — Progress Notes (Signed)
 Physical Therapy Session Note  Patient Details  Name: [name] MRN: [id number] Date of Birth: 10-03-1932  Today's Date: 07/20/2024 PT Individual Time: 0933-1015 PT Individual Time Calculation (min): 42 min   Short Term Goals: Week 1:  PT Short Term Goal 1 (Week 1): Pt will iniate sitting balance with training with L sided wedge PT Short Term Goal 1 - Progress (Week 1): Not met PT Short Term Goal 2 (Week 1): Pt will attempt sit to stand training PT Short Term Goal 2 - Progress (Week 1): Not met PT Short Term Goal 3 (Week 1): Pt will attempt slide board transfer training to explore transfer options PT Short Term Goal 3 - Progress (Week 1): Not met PT Short Term Goal 4 (Week 1): Pt will continue bed mobility training focusing on reducing caregiver burden PT Short Term Goal 4 - Progress (Week 1): Met  Skilled Therapeutic Interventions/Progress Updates:  Pt was seen bedside in the am. Pt rolled R/L with max A to assist with donning pants. Pt max A to don shirt. Pt transferred supine to edge of bed with max A and verbal cues. Pt transferred edge of bed to w/c with max A and verbal cues. Pt transported to rehab gym. Pt transferred sit to stand in parallel bars with max A and verbal cues. While standing worked on weight shifting and upright posture. Pt transferred sit to stand with stedy and max A and verbal cues. Second person to cue patient to weight shift over to the R. Pt tends to push to left able to self correct with verbal and tactile cues. While standing with stedy worked on upright posture and weight shifting towards the R. Pt returned to room following treatment and left sitting up in w/c with all needs within reach and daughter at bedside.   Therapy Documentation Precautions:  Precautions Precautions: Fall Recall of Precautions/Restrictions: Impaired Precaution/Restrictions Comments: Cortrak- NPO; dense L Hemi Restrictions Weight Bearing Restrictions Per Provider Order:  No General:   Pain: No c/o pain.     Therapy/Group: Individual Therapy  [name] 07/20/2024, 7:47 AM

## 2024-07-20 NOTE — Progress Notes (Signed)
 Speech Language Pathology Daily Session Note  Patient Details  Name: [name] MRN: [id number] Date of Birth: 19-Feb-1932  Today's Date: 07/20/2024 SLP Individual Time: 1203-1248 SLP Individual Time Calculation (min): 45 min  Short Term Goals: Week 2: SLP Short Term Goal 1 (Week 2): Pt will consume ice chips and teaspoons of thin liquid demonstrating reduction in frequency of overt s/sx of penetration/aspiration by at least 25% to indicate appropriateness for repeated objective swallow study. SLP Short Term Goal 2 (Week 2): Patient will recall daily events with 90% accuracy using internal/external memory aids given min assist. SLP Short Term Goal 3 (Week 2): Patient will complete pharyngeal strengthening exercises as appropriate given min A in order to improve swallow function  Skilled Therapeutic Interventions:   Pt seen for skilled SLP session to address swallowing and cognitive goals. Throughout session, pt needed cues and redirection to tasks and encouragement to initiate swallow exercises. She was able to scan to L side (where SLP was standing) when prompted, otherwise, demonstrated R gaze preference. Pt completed EMST easy-moderate level x10, CTAR x15, and effortful swallow with ice chips x10-15. Oral care prior to ice chips was completed. Discussed likely plan for repeat MBSS later this week.  Defer to primary SLP to discuss further and schedule. Pt left sitting upright in bed with call bell in reach and alarm engaged.   Pain Pain Assessment Pain Scale: 0-10 Pain Score: 0-No pain  Therapy/Group: Individual Therapy  Peyton JINNY Rummer 07/20/2024, 12:57 PM

## 2024-07-20 NOTE — Plan of Care (Signed)
  Problem: Consults Goal: RH STROKE PATIENT EDUCATION Description: See Patient Education module for education specifics  Outcome: Progressing Goal: Nutrition Consult-if indicated Outcome: Progressing   Problem: RH BOWEL ELIMINATION Goal: RH STG MANAGE BOWEL WITH ASSISTANCE Description: STG Manage Bowel with min Assistance. Outcome: Progressing   Problem: RH BLADDER ELIMINATION Goal: RH STG MANAGE BLADDER WITH ASSISTANCE Description: STG Manage Bladder With min Assistance Outcome: Progressing   Problem: RH KNOWLEDGE DEFICIT Goal: RH STG INCREASE KNOWLEDGE OF DYSPHAGIA/FLUID INTAKE Description: Patient and family will be able to demonstrate understanding of dietary modifications to prevent complications related to stroke independently using handout provided.  Outcome: Progressing Goal: RH STG INCREASE KNOWLEGDE OF HYPERLIPIDEMIA Description: Patient and family will be able to demonstrate understanding of medication management and dietary modifications to better control cholesterol levels to prevent further occurrence of stroke independently using handout provided.  Outcome: Progressing Goal: RH STG INCREASE KNOWLEDGE OF STROKE PROPHYLAXIS Description: Patient and family will be able to demonstrate understanding of medication management and dietary modifications to better control cholesterol levels to prevent further occurrence of stroke independently using handout provided.  Outcome: Progressing   Problem: Education: Goal: Ability to describe self-care measures that may prevent or decrease complications (Diabetes Survival Skills Education) will improve Outcome: Progressing Goal: Individualized Educational Video(s) Outcome: Progressing   Problem: Coping: Goal: Ability to adjust to condition or change in health will improve Outcome: Progressing   Problem: Fluid Volume: Goal: Ability to maintain a balanced intake and output will improve Outcome: Progressing   Problem: Health  Behavior/Discharge Planning: Goal: Ability to identify and utilize available resources and services will improve Outcome: Progressing Goal: Ability to manage health-related needs will improve Outcome: Progressing   Problem: RH Vision Goal: RH LTG Vision (Specify) Outcome: Progressing

## 2024-07-20 NOTE — Progress Notes (Signed)
 Pt's daughter noticed some bruising on the pt's left eye. Upon closer inspection, this nurse saw a hematoma formed over the left eye. Press photographer and provider notified. Dr. Lovorn also entered the room and examined that patient and determined that with the amount of Anticoagulants for stroke prophylaxis it can cause severe bruising. Pt's daughter notified of this and patient teaching provided. No new orders at this time. Pt left with Speech Therapy.   [name]  VEAR Miyamoto, LPN

## 2024-07-20 NOTE — Progress Notes (Signed)
 PROGRESS NOTE   Subjective/Complaints:  Saw pt 2 different times today- 1st time- reports LBM 2 days ago, but had been having diarrhea, so doesn't want meds yet.  Wearing L WHO.  2nd visit was due to black eye on L that's new form this AM when first saw her at 8am.  Daughter reports she was concerned about this- - did double check- on ASA, Plavix  and Lovenox  currently, which make is more likely to bruise, esp if knocked her eye against a shoulder during therapy- no falls or specific trauma today or yesterday per review of chart.    ROS: as per HPI.  Pt denies SOB, abd pain, CP, N/V/C/D, and vision changes  back pain improved, +muscle spasms, improved sleep, +tooth pain- not present at this time, +dry mouth- improved with d/c of flexeril    Objective:   No results found.    No results for input(s): WBC, HGB, HCT, PLT in the last 72 hours.   No results for input(s): NA, K, CL, CO2, GLUCOSE, BUN, CREATININE, CALCIUM  in the last 72 hours.    Intake/Output Summary (Last 24 hours) at 07/20/2024 1211 Last data filed at 07/20/2024 0852 Gross per 24 hour  Intake 480 ml  Output --  Net 480 ml        Physical Exam: Vital Signs Blood pressure (!) 137/59, pulse 72, temperature 98.7 F (37.1 C), resp. rate 16, height 5' 1 (1.549 m), weight 65 kg, SpO2 98%.    General: awake, alert, appropriate,  sitting up slightly in bed- both times; daughter at bedside 2nd time; NAD HENT: conjugate gaze; oropharynx dry- R gaze preference; 2nd visit had a dark purple new bruise along lateral aspect of upper eyelid not red, not swollen, just hematoma appearing-  CV: regular rate; no JVD Pulmonary: CTA B/L; no W/R/R- good air movement GI: soft, NT, ND, (+)BS- PEG in place Psychiatric: appropriate- decreased energy, but interactive Neurological: alert Skin: multiple bruises throughout skin esp on abd c/w lovenox   injections.    PRIOR EXAMS: HENT:     Head: Normocephalic and atraumatic.     Comments: Mild L facial droop Tongue midline  Mild to moderate facial redness- on R cheek    Right Ear: External ear normal.     Left Ear: External ear normal.     Nose: Nose normal. No congestion.     Comments: Cortrak- in nare    Mouth/Throat:     Mouth: Mucous membranes are dry.  Eyes:     Comments: Couldn't complete EOM's for me- but so sleepy  Musculoskeletal:     Cervical back: Neck supple.     Comments:  RUE 5-/5 LUE 0/5 throughout RLE- 4+/5 throughout- hard to get pt to do exam LLE- PF 1/5- otherwise 0/5 in LLE, stable 8/1 Skin:    General: Skin is warm and dry.     Comments: R forearm IV Some UE bruising  A lot of moles vs neurofibromas? On skin with actinic keratosis on multiple spots  Neurological:     Mental Status: She is alert.     Comments: Moderate dysarthria with occasional wet voice. Left facial weakness with left inattention. Left HH  and needs cues to look to left. Able to smaller numbers on the clock. She was able to answer basic orientation questions, state age, DOB and PCP name.  Left hemiparesis with sensory deficits.  No clonus, no hoffman's Strong (+) Babinski Flaccid on L side  Psychiatric:     Comments: Alert and smiling   Assessment/Plan: 1. Functional deficits which require 3+ hours per day of interdisciplinary therapy in a comprehensive inpatient rehab setting. Physiatrist is providing close team supervision and 24 hour management of active medical problems listed below. Physiatrist and rehab team continue to assess barriers to discharge/monitor patient progress toward functional and medical goals  Care Tool:  Bathing    Body parts bathed by patient: Face, Chest, Left arm, Abdomen   Body parts bathed by helper: Right arm, Buttocks, Front perineal area, Right lower leg, Left lower leg, Right upper leg, Left upper leg     Bathing assist Assist Level: Maximal  Assistance - Patient 24 - 49%     Upper Body Dressing/Undressing Upper body dressing   What is the patient wearing?: Pull over shirt    Upper body assist Assist Level: Moderate Assistance - Patient 50 - 74%    Lower Body Dressing/Undressing Lower body dressing      What is the patient wearing?: Pants, Incontinence brief     Lower body assist Assist for lower body dressing: Total Assistance - Patient < 25%     Toileting Toileting    Toileting assist Assist for toileting: 2 Helpers     Transfers Chair/bed transfer  Transfers assist  Chair/bed transfer activity did not occur: Safety/medical concerns (did not get up)  Chair/bed transfer assist level: Maximal Assistance - Patient 25 - 49%     Locomotion Ambulation   Ambulation assist   Ambulation activity did not occur: Safety/medical concerns          Walk 10 feet activity   Assist  Walk 10 feet activity did not occur: Safety/medical concerns        Walk 50 feet activity   Assist Walk 50 feet with 2 turns activity did not occur: Safety/medical concerns         Walk 150 feet activity   Assist Walk 150 feet activity did not occur: Safety/medical concerns         Walk 10 feet on uneven surface  activity   Assist Walk 10 feet on uneven surfaces activity did not occur: Safety/medical concerns         Wheelchair     Assist Is the patient using a wheelchair?: Yes Type of Wheelchair: Manual    Wheelchair assist level: Dependent - Patient 0%      Wheelchair 50 feet with 2 turns activity    Assist        Assist Level: Dependent - Patient 0%   Wheelchair 150 feet activity     Assist      Assist Level: Dependent - Patient 0%   Blood pressure (!) 137/59, pulse 72, temperature 98.7 F (37.1 C), resp. rate 16, height 5' 1 (1.549 m), weight 65 kg, SpO2 98%.  Medical Problem List and Plan: 1. Functional deficits secondary to R MCA stroke             -patient may   shower             -ELOS/Goals: 16-20 days- min-mod A            Con't CIR PT, OT and SLP  D3  started, increase to 2,000U, metanx also ordered Discussed with therapy initiating Hoyer lift training as patient wants to go home   2.  Impaired mobility: -DVT/anticoagulation:  Pharmaceutical: continue Lovenox  40mg  daily             -antiplatelet therapy: Continue ASA.  --Plavix  recommended if no further procedures planned. PEG placed without complications  3. Muscle spasms in back: ccontinue lidocaine  patches. Tylenol  500mg  TID/HS, Baclofen  5mg  added. Voltaren  gel.   --sedated from Flexeril  5 mg/tramadol  50 mg. Will decrease doses. Decrease prn Tramadol  to 50mg  q8H prn, d/c flexeril   Continue kpad  -d/c'd flexeril   8/3- Took lidoderm  patches off form yesterday 4. Hospital induced delirium at night: daughter may stay over at night -Conitnue melatonin 5mg  at 8 pm tonight. Sleep wake chart to monitor sleep              -antipsychotic agents: N/A  5. Neuropsych/cognition: This patient may be intermittently capable of making decisions on her own behalf.  6. Daytime somnolence: modafinil  100mg  daily ordered  7. Dysphagia/dry mouth: Will transition from Cortrak to PEG feeds 7/29, transitioned to bolus feeds, continue ice chips  8.  E coli bacteremia: Treated with 10 day course of antibiotics. Ceftriaxone -->Duricef thru 07/14. Thrombocytopenia/leucocytosis has resolved.              --Hematuria has resolved. No dysuria.   9. Chronic back/Right knee pain: SEE #3 ABOVE; Continue voltaren  gel qid to knee and back till family bring in the biofreeze (which works better) --continue Lidocaine  patches at nights to help with back spasms. Decrease flexeril  to 2.5 mg every 8 hours prn-- now d/c'd as below             --schedule tylenol  500 mg qid as LFTs resolved.   10. Abnormal LFTs: Have resolved and Lipitor 10mg  daily added on 07/19. Continue to monitor weekly for  now.   11. COPD/OSA: Has CPAP but not  using PTA. Continue Yupelri  nebs daily (in place of Spiriva).  --continue albuterol  every night PTA.  .  12. Hypomagnesemia: Magnesium  reviewed and has normalized  13. Anemia: Monitor for signs of bleeding. H/H recovering from 13-->9.9-->11.2  14.  Bradycardia: HR note to be in 51 to 61 range and had boderline prolonged Qtc on admission. Likely due to stroke.  Repeat EKG reviewed and is stable  15.  H/o MDD/anxiety: continue Lexapro  40mg  daily. Wellbutrin  started 75mg  BID  16. Bowel management: Last BM 8/1, continue to monitor, d/c suppository  17. R jaw/tooth pain: Continue Augmentin  BID x5 days to treat any potential dental infection; needs to f/up outpatient with a dentist eventually. Orthopanogram d/ced since pain has improved, Benzocaine  ordered -CT maxillofacial neg on 7/31  18. Dry mouth: d/c flexeril     19. Hematoma on L upper eyelid  8/3- cannot identify any trauma but could have been due to a transfer and caught her orbit  (since over the orbit bone)  on someone's shoulder and just showed up- no falls- no trauma documented from therapy- but did check her entire body/skin and cannot see any signs of large bruising, except due to lovenox - will con't to monitor- spoke with pt's daughter at length about this and noted we will monitor closely since appeared to show up  sometime this AM after we, nursing and I saw pt- no therapy today until SLP who just started when we entered room-    I spent a total of 52   minutes on total care today- >50%  coordination of care- due to  Pt seen twice- spoke with nursing x2 and with daughter about hematoma and also reviewed chart at length  LOS: 12 days A FACE TO FACE EVALUATION WAS PERFORMED  Ryle Buscemi 07/20/2024, 12:11 PM

## 2024-07-20 NOTE — Progress Notes (Signed)
 Occupational Therapy Session Note  Patient Details  Name: [name] MRN: [id number] Date of Birth: [birth year]  Today's Date: 07/20/2024 OT Individual Time: 1445-1530 OT Individual Time Calculation (min): 45 min    Short Term Goals: Week 2:  OT Short Term Goal 1 (Week 2): Patient will actively lean forward in midline while sitting to prepare for sit to stand OT Short Term Goal 2 (Week 2): Patient will locate left arm on body in functional context - during bathing and dressing OT Short Term Goal 3 (Week 2): Patient will sit at edge of bed for 2-3 seconds using compensatory strategies and without external support.  Skilled Therapeutic Interventions/Progress Updates:    Patient received supine in bed, caregiver- Doyal at bedside, indicating patient needing to use the bathroom.  Patient assisted to sit at edge of bed and transfer to chair.  +2 assistance needed for clothing management and hygiene.  Patient had continent void on toilet, and very pleased as she does not feel effective when using bedpan.   Patient transported to therapy gym to address level surface transfers.  Patient still limited by strong push toward weaker left side and insufficient postural control to maintain midline.  Worked on reducing pushing behavior and attempting moments of static sitting balance.  Transfer back to wheelchair and back to room where daughters and caregiver present.    Therapy Documentation Precautions:  Precautions Precautions: Fall Recall of Precautions/Restrictions: Impaired Precaution/Restrictions Comments: Cortrak- NPO; dense L Hemi Restrictions Weight Bearing Restrictions Per Provider Order: No  Pain: Pain Assessment Pain Scale: 0-10     Therapy/Group: Individual Therapy  Tameeka Luo M 07/20/2024, 3:44 PM

## 2024-07-21 DIAGNOSIS — R414 Neurologic neglect syndrome: Secondary | ICD-10-CM

## 2024-07-21 DIAGNOSIS — I639 Cerebral infarction, unspecified: Secondary | ICD-10-CM

## 2024-07-21 DIAGNOSIS — R1312 Dysphagia, oropharyngeal phase: Secondary | ICD-10-CM

## 2024-07-21 DIAGNOSIS — G8114 Spastic hemiplegia affecting left nondominant side: Secondary | ICD-10-CM

## 2024-07-21 LAB — CBC
HCT: 33.3 % — ABNORMAL LOW (ref 36.0–46.0)
Hemoglobin: 10.7 g/dL — ABNORMAL LOW (ref 12.0–15.0)
MCH: 31.2 pg (ref 26.0–34.0)
MCHC: 32.1 g/dL (ref 30.0–36.0)
MCV: 97.1 fL (ref 80.0–100.0)
Platelets: 147 K/uL — ABNORMAL LOW (ref 150–400)
RBC: 3.43 MIL/uL — ABNORMAL LOW (ref 3.87–5.11)
RDW: 14.6 % (ref 11.5–15.5)
WBC: 6 K/uL (ref 4.0–10.5)
nRBC: 0 % (ref 0.0–0.2)

## 2024-07-21 LAB — BASIC METABOLIC PANEL WITH GFR
Anion gap: 9 (ref 5–15)
BUN: 17 mg/dL (ref 8–23)
CO2: 26 mmol/L (ref 22–32)
Calcium: 9.1 mg/dL (ref 8.9–10.3)
Chloride: 105 mmol/L (ref 98–111)
Creatinine, Ser: 0.6 mg/dL (ref 0.44–1.00)
GFR, Estimated: >60 mL/min (ref >60)
Glucose, Bld: 107 mg/dL — ABNORMAL HIGH (ref 70–99)
Potassium: 4.4 mmol/L (ref 3.5–5.1)
Sodium: 140 mmol/L (ref 135–145)

## 2024-07-21 NOTE — Plan of Care (Signed)
  Problem: Consults Goal: RH STROKE PATIENT EDUCATION Description: See Patient Education module for education specifics  Outcome: Progressing Goal: Nutrition Consult-if indicated Outcome: Progressing   Problem: RH BOWEL ELIMINATION Goal: RH STG MANAGE BOWEL WITH ASSISTANCE Description: STG Manage Bowel with min Assistance. Outcome: Progressing   Problem: RH BLADDER ELIMINATION Goal: RH STG MANAGE BLADDER WITH ASSISTANCE Description: STG Manage Bladder With min Assistance Outcome: Progressing   Problem: RH KNOWLEDGE DEFICIT Goal: RH STG INCREASE KNOWLEDGE OF DYSPHAGIA/FLUID INTAKE Description: Patient and family will be able to demonstrate understanding of dietary modifications to prevent complications related to stroke independently using handout provided.  Outcome: Progressing Goal: RH STG INCREASE KNOWLEGDE OF HYPERLIPIDEMIA Description: Patient and family will be able to demonstrate understanding of medication management and dietary modifications to better control cholesterol levels to prevent further occurrence of stroke independently using handout provided.  Outcome: Progressing Goal: RH STG INCREASE KNOWLEDGE OF STROKE PROPHYLAXIS Description: Patient and family will be able to demonstrate understanding of medication management and dietary modifications to better control cholesterol levels to prevent further occurrence of stroke independently using handout provided.  Outcome: Progressing   Problem: Education: Goal: Ability to describe self-care measures that may prevent or decrease complications (Diabetes Survival Skills Education) will improve Outcome: Progressing Goal: Individualized Educational Video(s) Outcome: Progressing   Problem: Coping: Goal: Ability to adjust to condition or change in health will improve Outcome: Progressing   Problem: Fluid Volume: Goal: Ability to maintain a balanced intake and output will improve Outcome: Progressing   Problem: Health  Behavior/Discharge Planning: Goal: Ability to identify and utilize available resources and services will improve Outcome: Progressing Goal: Ability to manage health-related needs will improve Outcome: Progressing   Problem: RH Vision Goal: RH LTG Vision (Specify) Outcome: Progressing

## 2024-07-21 NOTE — Progress Notes (Signed)
 Physical Therapy Session Note  Patient Details  Name: [name] MRN: [id number] Date of Birth: 03-Aug-1932  Today's Date: 07/21/2024 PT Individual Time: 9199-9154 + [id number] PT Individual Time Calculation (min): 45 min + 43 min  Short Term Goals: Week 2:  PT Short Term Goal 1 (Week 2): Pt will complete bed mobility with maxA of 1 person PT Short Term Goal 2 (Week 2): Pt will complete bed<>chair transfers consistently at maxA level PT Short Term Goal 3 (Week 2): Pt will sit unsupported at EOB for 1 minute with modA  Skilled Therapeutic Interventions/Progress Updates:      1st session: Pt presents in bed with her daughter present at the bedside. Patient has no complaints of pain. When asked, reports needed to be change and cleaned. New clean brief donned and patient assisted in pericare. TotalA for brief change. Able to roll to her L with minA using bed rail and needs maxA for rolling to her R side. Pants donned at bed level with totalA.   Supine<>sitting EOB with maxA towards window side. Needs maxA for siting balance due to her L trunk lean. MaxA for squat pivot transfer to wheelchair, using over the back method to facilitate forward and R trunk lean during transfer.   Patient taken to day room and setup in Standing frame. Patient rose to stand with dependent assist via frame and then was instructed in shooting basketball on her L side to promote visual attention and scanning. Patient ~75% accurate! Standing in frame for > 5 minutes duration.  Returned patient to her room and assisted to the recliner for improved comfort. Setup with pillows to support her trunk and arms. Seat belt alarm on and call bell in lap.   2nd session: Pt in bed sleeping on arrival. Awakens to touch and is in agreement to therapy treatment. Patient noted to be soiled with wet brief and wet linen, unaware of incontinence. TotalA provided for pericare and brief change. Notified NT of need for assistance with bed  linen change. Patient assist to EOB with maxA and then completed a stand step (!) transfer with maxA to her R side from EOB to arm chair. While NT changed bed linen, she completed repeated sit<>stands 2x5 from chair with seated rest breaks b/w sets. She completed these with modA and L knee blocked. Then worked static sitting in arm chair for midline awareness and postural control. Patient able to briefly hold moments of midline but quickly falls to the L. She reports she's aware of L lean but unable to correct it. Very delayed in any protective responses as well. She was assisted back to bed with a totalA squat pivot transfer - patient fatigued at this point and difficult to initiate effort. TotalA needed for getting her back to bed. Pt ended session with her needs met.     Therapy Documentation Precautions:  Precautions Precautions: Fall Recall of Precautions/Restrictions: Impaired Precaution/Restrictions Comments: Cortrak- NPO; dense L Hemi Restrictions Weight Bearing Restrictions Per Provider Order: No General:     Therapy/Group: Individual Therapy  [name] 07/21/2024, 7:37 AM

## 2024-07-21 NOTE — Progress Notes (Deleted)
 Patient ID: [name], female   DOB: Jan 28, 1932, 89 y.o.   MRN: [id number]

## 2024-07-21 NOTE — Progress Notes (Signed)
 Patient ID: [name], female   DOB: 08/23/1932, 89 y.o.   MRN: [id number]  Met with [name]-daughter to discuss the plan. Daughter wants to honor her Mom's wishes and take her home and provide care. Her sister's are not in agreement with her and feel Mom needs to go to a SNF. [name] needs to get the ramp at [facility] house fixed and to see if doorways can be made bigger. She also needs to more furniture to make more room for a wheelchair. Will ask at team conference to see if progressing and if can extend some so all of this can be accomplished and have plans in place. [name] is here daily and stays overnight with pt. Will get private duty list and medical supply list for daughter and continue to work on discharge needs.

## 2024-07-21 NOTE — Progress Notes (Signed)
 Speech Language Pathology Daily Session Note  Patient Details  Name: [name] MRN: [id number] Date of Birth: [birth year]  Today's Date: 07/21/2024 SLP Individual Time: 8899-8875 SLP Individual Time Calculation (min): 24 min  Short Term Goals: Week 2: SLP Short Term Goal 1 (Week 2): Pt will consume ice chips and teaspoons of thin liquid demonstrating reduction in frequency of overt s/sx of penetration/aspiration by at least 25% to indicate appropriateness for repeated objective swallow study. SLP Short Term Goal 2 (Week 2): Patient will recall daily events with 90% accuracy using internal/external memory aids given min assist. SLP Short Term Goal 3 (Week 2): Patient will complete pharyngeal strengthening exercises as appropriate given min A in order to improve swallow function  Skilled Therapeutic Interventions: SLP conducted skilled therapy session targeting dysphagia and cognitive goals. Patient completed oral care via suction toothbrush with setupA, then participated in ice chip and thin liquid trials. Patient is demonstrating mildly reduced overt s/sx of penetration/aspiration, exhibiting no signs with ice chips and immediate cough with teaspoons of water  in only 1/5 trials. Patient consumed cup sip x1 and demonstrated immediate cough. She notes that she is scared of getting strangled and it makes her hesitant to participate in trials of thin liquids, though she continues to participate when encouraged. Patient then completed 5 sets of 5 with EMST set to 15 cmH2O. Between sets, patient challenged in recall of today's date and events from prior therapy sessions. She benefited from mod assist to accurately recall tasks completed during PT and OT sessions. Patient was left in room with call bell in reach and alarm set. SLP will continue to target goals per plan of care.        Pain  None endorsed  Therapy/Group: Individual Therapy  [name], M.A., CCC-SLP  Lucion Dilger A Zayah Keilman 07/21/2024,  1:03 PM

## 2024-07-21 NOTE — Plan of Care (Signed)
 Problem: Consults Goal: RH STROKE PATIENT EDUCATION Description: See Patient Education module for education specifics  Outcome: Progressing Goal: Nutrition Consult-if indicated Outcome: Progressing   Problem: RH BOWEL ELIMINATION Goal: RH STG MANAGE BOWEL WITH ASSISTANCE Description: STG Manage Bowel with min Assistance. Outcome: Progressing   Problem: RH BLADDER ELIMINATION Goal: RH STG MANAGE BLADDER WITH ASSISTANCE Description: STG Manage Bladder With min Assistance Outcome: Progressing   Problem: RH KNOWLEDGE DEFICIT Goal: RH STG INCREASE KNOWLEDGE OF DYSPHAGIA/FLUID INTAKE Description: Patient and family will be able to demonstrate understanding of dietary modifications to prevent complications related to stroke independently using handout provided.  Outcome: Progressing Goal: RH STG INCREASE KNOWLEGDE OF HYPERLIPIDEMIA Description: Patient and family will be able to demonstrate understanding of medication management and dietary modifications to better control cholesterol levels to prevent further occurrence of stroke independently using handout provided.  Outcome: Progressing Goal: RH STG INCREASE KNOWLEDGE OF STROKE PROPHYLAXIS Description: Patient and family will be able to demonstrate understanding of medication management and dietary modifications to better control cholesterol levels to prevent further occurrence of stroke independently using handout provided.  Outcome: Progressing   Problem: Education: Goal: Ability to describe self-care measures that may prevent or decrease complications (Diabetes Survival Skills Education) will improve Outcome: Progressing Goal: Individualized Educational Video(s) Outcome: Progressing   Problem: Coping: Goal: Ability to adjust to condition or change in health will improve Outcome: Progressing   Problem: Fluid Volume: Goal: Ability to maintain a balanced intake and output will improve Outcome: Progressing   Problem: Health  Behavior/Discharge Planning: Goal: Ability to identify and utilize available resources and services will improve Outcome: Progressing Goal: Ability to manage health-related needs will improve Outcome: Progressing   Problem: RH Vision Goal: RH LTG Vision (Specify) Outcome: Progressing   Problem: Consults Goal: RH STROKE PATIENT EDUCATION Description: See Patient Education module for education specifics  Outcome: Progressing Goal: Nutrition Consult-if indicated Outcome: Progressing   Problem: RH BOWEL ELIMINATION Goal: RH STG MANAGE BOWEL WITH ASSISTANCE Description: STG Manage Bowel with min Assistance. Outcome: Progressing   Problem: RH BLADDER ELIMINATION Goal: RH STG MANAGE BLADDER WITH ASSISTANCE Description: STG Manage Bladder With min Assistance Outcome: Progressing   Problem: RH KNOWLEDGE DEFICIT Goal: RH STG INCREASE KNOWLEDGE OF DYSPHAGIA/FLUID INTAKE Description: Patient and family will be able to demonstrate understanding of dietary modifications to prevent complications related to stroke independently using handout provided.  Outcome: Progressing Goal: RH STG INCREASE KNOWLEGDE OF HYPERLIPIDEMIA Description: Patient and family will be able to demonstrate understanding of medication management and dietary modifications to better control cholesterol levels to prevent further occurrence of stroke independently using handout provided.  Outcome: Progressing Goal: RH STG INCREASE KNOWLEDGE OF STROKE PROPHYLAXIS Description: Patient and family will be able to demonstrate understanding of medication management and dietary modifications to better control cholesterol levels to prevent further occurrence of stroke independently using handout provided.  Outcome: Progressing   Problem: Education: Goal: Ability to describe self-care measures that may prevent or decrease complications (Diabetes Survival Skills Education) will improve Outcome: Progressing Goal: Individualized  Educational Video(s) Outcome: Progressing   Problem: Coping: Goal: Ability to adjust to condition or change in health will improve Outcome: Progressing   Problem: Fluid Volume: Goal: Ability to maintain a balanced intake and output will improve Outcome: Progressing   Problem: Health Behavior/Discharge Planning: Goal: Ability to identify and utilize available resources and services will improve Outcome: Progressing Goal: Ability to manage health-related needs will improve Outcome: Progressing   Problem: RH Vision Goal: RH LTG Vision Consulting civil engineer)  Outcome: Progressing

## 2024-07-21 NOTE — Progress Notes (Signed)
 Occupational Therapy Session Note  Patient Details  Name: [name] MRN: [id number] Date of Birth: April 10, 1932  Today's Date: 07/21/2024 OT Individual Time: 0915-1000 OT Individual Time Calculation (min): 45 min    Short Term Goals: Week 2:  OT Short Term Goal 1 (Week 2): Patient will actively lean forward in midline while sitting to prepare for sit to stand OT Short Term Goal 2 (Week 2): Patient will locate left arm on body in functional context - during bathing and dressing OT Short Term Goal 3 (Week 2): Patient will sit at edge of bed for 2-3 seconds using compensatory strategies and without external support.  Skilled Therapeutic Interventions/Progress Updates:   Patient received seated in recliner.  Patient agreeable to OT session and used pneumatic sit to stand lift (sara plus) to transfer.  Patient able to stand once 2/3 in standing with lift.  Patient transferred to wheelchair to complete upper body bathing and dressing at sink.  Patient pushing strongly to left in sitting in wheelchair this am.  Attempted to use mirror for visual feedback, and reaching to targets to right to correct posture with limited success.  Patient expressing concern about transition to home - That's just too much for [facility].  Patient pensive regarding discharge - I just want to go home.  Left up in wheelchair with safety belt in place and engaged and call bell in lap.  Left arm supported on pillow.    Therapy Documentation Precautions:  Precautions Precautions: Fall Recall of Precautions/Restrictions: Impaired Precaution/Restrictions Comments: Cortrak- NPO; dense L Hemi Restrictions Weight Bearing Restrictions Per Provider Order: No   Pain:  Denies pain       Therapy/Group: Individual Therapy  Kendell Gammon M 07/21/2024, 12:25 PM

## 2024-07-21 NOTE — Progress Notes (Signed)
 PROGRESS NOTE   Subjective/Complaints: No eye pain or new visual issues  ROS:  Pt denies SOB, abd pain, CP, N/V/C/D, and vision changes Objective:   No results found.    Recent Labs    07/21/24 0517  WBC 6.0  HGB 10.7*  HCT 33.3*  PLT 147*     Recent Labs    07/21/24 0517  NA 140  K 4.4  CL 105  CO2 26  GLUCOSE 107*  BUN 17  CREATININE 0.60  CALCIUM  9.1      Intake/Output Summary (Last 24 hours) at 07/21/2024 1025 Last data filed at 07/21/2024 0916 Gross per 24 hour  Intake 480 ml  Output --  Net 480 ml        Physical Exam: Vital Signs Blood pressure (!) 155/58, pulse 71, temperature 98 F (36.7 C), temperature source Oral, resp. rate 19, height 5' 1 (1.549 m), weight 66.9 kg, SpO2 98%.  General: No acute distress Mood and affect are appropriate Heart: Regular rate and rhythm no rubs murmurs or extra sounds Lungs: Clear to auscultation, breathing unlabored, no rales or wheezes Abdomen: Positive bowel sounds, soft nontender to palpation, nondistended Extremities: No clubbing, cyanosis, or edema   Neurological: alert Skin: multiple bruises throughout skin esp on abd c/w lovenox  injections.        Comments:  RUE 5-/5 LUE 0/5 throughout RLE- 4+/5 throughout- hard to get pt to do exam LLE- PF 1/5- otherwise 0/5 in LLE, stable 8/1 Skin:    General: Skin is warm and dry.   Neurological:     Mental Status: She is alert.     Comments: Mild dysarthria with occasional wet voice. Left facial weakness with left inattention. Left HH and needs cues to look to left. A. She was able to answer basic questions, name and place, but thought it was SUnday , not Monday   Left hemiparesis with sensory deficits.  No clonus, no hoffman's  Flaccid tone on L side  Psychiatric:     Comments: Alert and smiling   Assessment/Plan: 1. Functional deficits which require 3+ hours per day of interdisciplinary  therapy in a comprehensive inpatient rehab setting. Physiatrist is providing close team supervision and 24 hour management of active medical problems listed below. Physiatrist and rehab team continue to assess barriers to discharge/monitor patient progress toward functional and medical goals  Care Tool:  Bathing    Body parts bathed by patient: Face, Chest, Left arm, Abdomen   Body parts bathed by helper: Right arm, Buttocks, Front perineal area, Right lower leg, Left lower leg, Right upper leg, Left upper leg     Bathing assist Assist Level: Maximal Assistance - Patient 24 - 49%     Upper Body Dressing/Undressing Upper body dressing   What is the patient wearing?: Pull over shirt    Upper body assist Assist Level: Moderate Assistance - Patient 50 - 74%    Lower Body Dressing/Undressing Lower body dressing      What is the patient wearing?: Pants, Incontinence brief     Lower body assist Assist for lower body dressing: Total Assistance - Patient < 25%     Toileting Toileting  Toileting assist Assist for toileting: 2 Helpers     Transfers Chair/bed transfer  Transfers assist  Chair/bed transfer activity did not occur: Safety/medical concerns (did not get up)  Chair/bed transfer assist level: Maximal Assistance - Patient 25 - 49%     Locomotion Ambulation   Ambulation assist   Ambulation activity did not occur: Safety/medical concerns          Walk 10 feet activity   Assist  Walk 10 feet activity did not occur: Safety/medical concerns        Walk 50 feet activity   Assist Walk 50 feet with 2 turns activity did not occur: Safety/medical concerns         Walk 150 feet activity   Assist Walk 150 feet activity did not occur: Safety/medical concerns         Walk 10 feet on uneven surface  activity   Assist Walk 10 feet on uneven surfaces activity did not occur: Safety/medical concerns         Wheelchair     Assist Is the  patient using a wheelchair?: Yes Type of Wheelchair: Manual    Wheelchair assist level: Dependent - Patient 0%      Wheelchair 50 feet with 2 turns activity    Assist        Assist Level: Dependent - Patient 0%   Wheelchair 150 feet activity     Assist      Assist Level: Dependent - Patient 0%   Blood pressure (!) 155/58, pulse 71, temperature 98 F (36.7 C), temperature source Oral, resp. rate 19, height 5' 1 (1.549 m), weight 66.9 kg, SpO2 98%.  Medical Problem List and Plan: 1. Functional deficits secondary to R MCA stroke             -patient may  shower             -ELOS/Goals: 16-20 days- min-mod A            Con't CIR PT, OT and SLP  D3 started, increase to 2,000U, metanx also ordered Discussed with therapy initiating Hoyer lift training as patient wants to go home   2.  Impaired mobility: -DVT/anticoagulation:  Pharmaceutical: continue Lovenox  40mg  daily             -antiplatelet therapy: Continue ASA.  --Plavix  recommended if no further procedures planned. PEG placed without complications  3. Muscle spasms in back: ccontinue lidocaine  patches. Tylenol  500mg  TID/HS, Baclofen  5mg  added. Voltaren  gel.   --sedated from Flexeril  5 mg/tramadol  50 mg. Will decrease doses. Decrease prn Tramadol  to 50mg  q8H prn, d/c flexeril   Continue kpad  -d/c'd flexeril   8/3- Took lidoderm  patches off form yesterday 4. Hospital induced delirium at night: daughter may stay over at night -Conitnue melatonin 5mg  at 8 pm tonight. Sleep wake chart to monitor sleep              -antipsychotic agents: N/A  5. Neuropsych/cognition: This patient may be intermittently capable of making decisions on her own behalf.  6. Daytime somnolence: modafinil  100mg  daily ordered  7. Dysphagia/dry mouth: NPO on PEG feeds 7/29, transitioned to bolus feeds, Osmolite bolus QID  continue ice chips  8.  E coli bacteremia: Treated with 10 day course of antibiotics. Ceftriaxone -->Duricef thru  07/14. Thrombocytopenia/leucocytosis has resolved.              --Hematuria has resolved. No dysuria.   9. Chronic back/Right knee pain: SEE #3 ABOVE; Continue voltaren  gel qid  to knee and back till family bring in the biofreeze (which works better) --continue Lidocaine  patches at nights to help with back spasms. Decrease flexeril  to 2.5 mg every 8 hours prn-- now d/c'd as below             --schedule tylenol  500 mg qid as LFTs resolved.   10. Abnormal LFTs: Have resolved and Lipitor 10mg  daily added on 07/19. Continue to monitor weekly for  now.   11. COPD/OSA: Has CPAP but not using PTA. Continue Yupelri  nebs daily (in place of Spiriva).  --continue albuterol  every night PTA.  .  12. Hypomagnesemia: Magnesium  reviewed and has normalized  13. Anemia: Monitor for signs of bleeding. H/H recovering from 13-->9.9-->11.2  14.  Bradycardia: HR note to be in 51 to 61 range and had boderline prolonged Qtc on admission. Likely due to stroke.  Repeat EKG reviewed and is stable  15.  H/o MDD/anxiety: continue Lexapro  40mg  daily. Wellbutrin  started 75mg  BID  16. Bowel management: Last BM 8/1, continue to monitor, d/c suppository  17. R jaw/tooth pain: Continue Augmentin  BID x5 days completed to treat any potential dental infection; needs to f/up outpatient with a dentist eventually.Benzocaine  ordered -CT maxillofacial neg on 7/31  18. Dry mouth: d/c flexeril     19. Hematoma on L lateral periorbital area, well circumscribed  8/3- cannot identify any trauma   20.  Anemia- stable , plt borderline, cont to monitor     Latest Ref Rng & Units 07/21/2024    5:17 AM 07/17/2024    5:02 AM 07/14/2024    5:48 AM  CBC  WBC 4.0 - 10.5 K/uL 6.0  5.8  5.1   Hemoglobin 12.0 - 15.0 g/dL 89.2  88.6  88.5   Hematocrit 36.0 - 46.0 % 33.3  33.4  34.0   Platelets 150 - 400 K/uL 147  153  168      LOS: 13 days A FACE TO FACE EVALUATION WAS PERFORMED  Prentice BRAVO [name] 07/21/2024, 10:25 AM

## 2024-07-22 MED ORDER — SENNA 8.6 MG PO TABS
1.0000 | ORAL_TABLET | Freq: Every day | ORAL | Status: DC
  Administered 2024-07-22 – 2024-08-01 (×14): 8.6 mg via ORAL
  Filled 2024-07-22 (×11): qty 1

## 2024-07-22 MED ORDER — SORBITOL 70 % SOLN
15.0000 mL | Freq: Once | Status: AC
  Administered 2024-07-22: 15 mL via ORAL
  Filled 2024-07-22: qty 30

## 2024-07-22 NOTE — Progress Notes (Signed)
 PROGRESS NOTE   Subjective/Complaints:  Pt reports no eye pain-  is cold this AM- but blanket a 1 sock off.  No BM per pt since diarrhea episode- however did have small BM 8/3 night- willing to do low dose Sorbitol  to help her go.  Doesn't want high dose, since concerned will cause diarrhea.    ROS:  Pt denies SOB, abd pain, CP, N/V/C/D, and vision changes Objective:   No results found.    Recent Labs    07/21/24 0517  WBC 6.0  HGB 10.7*  HCT 33.3*  PLT 147*     Recent Labs    07/21/24 0517  NA 140  K 4.4  CL 105  CO2 26  GLUCOSE 107*  BUN 17  CREATININE 0.60  CALCIUM  9.1      Intake/Output Summary (Last 24 hours) at 07/22/2024 1009 Last data filed at 07/22/2024 0929 Gross per 24 hour  Intake 480 ml  Output --  Net 480 ml        Physical Exam: Vital Signs Blood pressure (!) 141/66, pulse 72, temperature 98.5 F (36.9 C), temperature source Oral, resp. rate 17, height 5' 1 (1.549 m), weight 65.7 kg, SpO2 96%.    General: awake, alert, appropriate, NAD HENT: R gaze preference; oropharynx dry- L eye hematoma is spreading downwards due to gravity CV: regular rate and rhythm; no JVD Pulmonary: CTA B/L; no W/R/R- good air movement GI: soft, NT, ND, (+)BS- hypoactive Psychiatric: appropriate- flat affect Neurological: alert delayed responses, but answers correctly per her daughter Skin: multiple bruises throughout skin esp on abd c/w lovenox  injections.        Comments:  RUE 5-/5 LUE 0/5 throughout RLE- 4+/5 throughout- hard to get pt to do exam LLE- PF 1/5- otherwise 0/5 in LLE, stable 8/1 Skin:    General: Skin is warm and dry.   Neurological:     Mental Status: She is alert.     Comments: Mild dysarthria with occasional wet voice. Left facial weakness with left inattention. Left HH and needs cues to look to left. A. She was able to answer basic questions, name and place, but thought it  was SUnday , not Monday   Left hemiparesis with sensory deficits.  No clonus, no hoffman's  Flaccid tone on L side  Psychiatric:     Comments: Alert and smiling   Assessment/Plan: 1. Functional deficits which require 3+ hours per day of interdisciplinary therapy in a comprehensive inpatient rehab setting. Physiatrist is providing close team supervision and 24 hour management of active medical problems listed below. Physiatrist and rehab team continue to assess barriers to discharge/monitor patient progress toward functional and medical goals  Care Tool:  Bathing    Body parts bathed by patient: Face, Chest, Abdomen   Body parts bathed by helper: Right arm, Left arm, Front perineal area, Buttocks, Right upper leg, Left upper leg, Right lower leg, Left lower leg     Bathing assist Assist Level: Maximal Assistance - Patient 24 - 49%     Upper Body Dressing/Undressing Upper body dressing   What is the patient wearing?: Pull over shirt    Upper body assist Assist Level:  Moderate Assistance - Patient 50 - 74%    Lower Body Dressing/Undressing Lower body dressing      What is the patient wearing?: Pants, Incontinence brief     Lower body assist Assist for lower body dressing: Total Assistance - Patient < 25%     Toileting Toileting    Toileting assist Assist for toileting: 2 Helpers     Transfers Chair/bed transfer  Transfers assist  Chair/bed transfer activity did not occur: Safety/medical concerns (did not get up)  Chair/bed transfer assist level: Maximal Assistance - Patient 25 - 49%     Locomotion Ambulation   Ambulation assist   Ambulation activity did not occur: Safety/medical concerns          Walk 10 feet activity   Assist  Walk 10 feet activity did not occur: Safety/medical concerns        Walk 50 feet activity   Assist Walk 50 feet with 2 turns activity did not occur: Safety/medical concerns         Walk 150 feet  activity   Assist Walk 150 feet activity did not occur: Safety/medical concerns         Walk 10 feet on uneven surface  activity   Assist Walk 10 feet on uneven surfaces activity did not occur: Safety/medical concerns         Wheelchair     Assist Is the patient using a wheelchair?: Yes Type of Wheelchair: Manual    Wheelchair assist level: Dependent - Patient 0%      Wheelchair 50 feet with 2 turns activity    Assist        Assist Level: Dependent - Patient 0%   Wheelchair 150 feet activity     Assist      Assist Level: Dependent - Patient 0%   Blood pressure (!) 141/66, pulse 72, temperature 98.5 F (36.9 C), temperature source Oral, resp. rate 17, height 5' 1 (1.549 m), weight 65.7 kg, SpO2 96%.  Medical Problem List and Plan: 1. Functional deficits secondary to R MCA stroke             -patient may  shower             -ELOS/Goals: 16-20 days- min-mod A            Con't CIR PT, OT and SLP  Team conference Wednesday =will determine length of stay  D3 started, increase to 2,000U, metanx also ordered Discussed with therapy initiating Hoyer lift training as patient wants to go home   2.  Impaired mobility: -DVT/anticoagulation:  Pharmaceutical: continue Lovenox  40mg  daily             -antiplatelet therapy: Continue ASA.  --Plavix  recommended if no further procedures planned. PEG placed without complications  3. Muscle spasms in back: ccontinue lidocaine  patches. Tylenol  500mg  TID/HS, Baclofen  5mg  added. Voltaren  gel.   --sedated from Flexeril  5 mg/tramadol  50 mg. Will decrease doses. Decrease prn Tramadol  to 50mg  q8H prn, d/c flexeril   Continue kpad  -d/c'd flexeril   8/3- Took lidoderm  patches off form yesterday  8/5- Denies pain this AM 4. Hospital induced delirium at night: daughter may stay over at night -Conitnue melatonin 5mg  at 8 pm tonight. Sleep wake chart to monitor sleep              -antipsychotic agents: N/A  5.  Neuropsych/cognition: This patient may be intermittently capable of making decisions on her own behalf.  6. Daytime somnolence: modafinil  100mg  daily ordered  7. Dysphagia/dry mouth: NPO on PEG feeds 7/29, transitioned to bolus feeds, Osmolite bolus QID  continue ice chips  8.  E coli bacteremia: Treated with 10 day course of antibiotics. Ceftriaxone -->Duricef thru 07/14. Thrombocytopenia/leucocytosis has resolved.              --Hematuria has resolved. No dysuria.   9. Chronic back/Right knee pain: SEE #3 ABOVE; Continue voltaren  gel qid to knee and back till family bring in the biofreeze (which works better) --continue Lidocaine  patches at nights to help with back spasms. Decrease flexeril  to 2.5 mg every 8 hours prn-- now d/c'd as below             --schedule tylenol  500 mg qid as LFTs resolved.   10. Abnormal LFTs: Have resolved and Lipitor 10mg  daily added on 07/19. Continue to monitor weekly for  now.   8/5- will check CMP q Monday and BMP q Thursday- will check in AM since haven't rechecked in 1 week  11. COPD/OSA: Has CPAP but not using PTA. Continue Yupelri  nebs daily (in place of Spiriva).  --continue albuterol  every night PTA.  .  12. Hypomagnesemia: Magnesium  reviewed and has normalized  13. Anemia: Monitor for signs of bleeding. H/H recovering from 13-->9.9-->11.2  14.  Bradycardia: HR note to be in 51 to 61 range and had boderline prolonged Qtc on admission. Likely due to stroke.  Repeat EKG reviewed and is stable  15.  H/o MDD/anxiety: continue Lexapro  40mg  daily. Wellbutrin  started 75mg  BID  16. Bowel management: Last BM 8/1, continue to monitor, d/c suppository  8/5- Will give Sorbitol  15cc- and add Senna 1 tab daily- to help her go regularly- since on Tramadol  prn 17. R jaw/tooth pain: Continue Augmentin  BID x5 days completed to treat any potential dental infection; needs to f/up outpatient with a dentist eventually.Benzocaine  ordered -CT maxillofacial neg on  7/31  18. Dry mouth: d/c flexeril     19. Hematoma on L lateral periorbital area, well circumscribed  8/3- cannot identify any trauma   8/5- Is spreading due to gravity/when sits up- educated daughter this is normal- she was a little concerned.  20.  Anemia- stable , plt borderline, cont to monitor     Latest Ref Rng & Units 07/21/2024    5:17 AM 07/17/2024    5:02 AM 07/14/2024    5:48 AM  CBC  WBC 4.0 - 10.5 K/uL 6.0  5.8  5.1   Hemoglobin 12.0 - 15.0 g/dL 89.2  88.6  88.5   Hematocrit 36.0 - 46.0 % 33.3  33.4  34.0   Platelets 150 - 400 K/uL 147  153  168    I spent a total of  38   minutes on total care today- >50% coordination of care- due to  D/w Daughter and pt about L eye hematoma as well as nursing- also reviewed orders and labs- and rewrote orders  LOS: 14 days A FACE TO FACE EVALUATION WAS PERFORMED  Kurtiss Wence 07/22/2024, 10:09 AM

## 2024-07-22 NOTE — Progress Notes (Signed)
 Education provided to daughter [name] on tube maintenance and tube feeding. [name] provided return demonstration with no issues. All questions answered at beside.   Geni Armor, LPN

## 2024-07-22 NOTE — Progress Notes (Signed)
 Speech Language Pathology Daily Session Note  Patient Details  Name: [name] MRN: [id number] Date of Birth: 1932/12/07  Today's Date: 07/22/2024 SLP Individual Time: 1500-1530 SLP Individual Time Calculation (min): 30 min  Short Term Goals: Week 2: SLP Short Term Goal 1 (Week 2): Pt will consume ice chips and teaspoons of thin liquid demonstrating reduction in frequency of overt s/sx of penetration/aspiration by at least 25% to indicate appropriateness for repeated objective swallow study. SLP Short Term Goal 2 (Week 2): Patient will recall daily events with 90% accuracy using internal/external memory aids given min assist. SLP Short Term Goal 3 (Week 2): Patient will complete pharyngeal strengthening exercises as appropriate given min A in order to improve swallow function  Skilled Therapeutic Interventions: Skilled therapy session focused on cognitive and dysphagia goals. SLP targeted dysphagia goals through prompting completion of expiratory muscle strength training x25 at 15cm H2O and chin tucks against resistance x30. Patient then completed oral care via suction toothbrush and consumed x4 ice chips with immediate coughing in x1 trial. Recommend continuation of NPO w/ alternative means of nutrition, hydration and medication administration. Plan for repeat MBS this week. SLP targeted cognitive goals through orientation task. Patient oriented to self, situation, location and year, however required maxA to utilize calendar for month and date. Patient left in bed with alarm set and call bell in reach. Continue POC.    Pain None reported   Therapy/Group: Individual Therapy  Daniah Zaldivar M.A., CCC-SLP 07/22/2024, 7:52 AM

## 2024-07-22 NOTE — Progress Notes (Signed)
 Physical Therapy Session Note  Patient Details  Name: [name] MRN: [id number] Date of Birth: 07-21-1932  Today's Date: 07/22/2024 PT Individual Time: 1000-1114 PT Individual Time Calculation (min): 74 min   Short Term Goals: Week 2:  PT Short Term Goal 1 (Week 2): Pt will complete bed mobility with maxA of 1 person PT Short Term Goal 2 (Week 2): Pt will complete bed<>chair transfers consistently at maxA level PT Short Term Goal 3 (Week 2): Pt will sit unsupported at EOB for 1 minute with modA  Skilled Therapeutic Interventions/Progress Updates:      Pt seen in bed to start with her daughter attempting to don a t-shirt. PT providing assist and educated on hemi technique for dressing. Donned pants at bed level with +2 totalA for time management. Pt denies any pain.   Supine<>Sitting EOB with maxA using hospital bed features. Continues to demonstrate heavy L trunk lean in sitting. MaxA for achieving squat pivot transfer from EOB to wheelchair with some improvement in initiation noted.   Transported at wheelchair level to main rehab gym and placed outside with R hand rail support. Worked on [employer] for sit<>stands, standing tolerance, and postural awareness/control. Patient requires maxA for completing sit<>Stand with her L knee blocked and trunk supported to help manage her lean. Used the wall as visual/tactile cue for weight shifting which she responded well to. Needs modA for static standing balance. She completed several repetitions of these with extended seated rest breaks for recovery.  Then worked from elevated mat table (due to fatigue) and Camie Ip for AD to help facilitate standing. Requires +2 modA for standing from elevated surface and using the [facility] - continues to require cues for midline awareness due to pushing L. Several stands from both perched position and mat table height.   Patient returned to her room and patient requested to rest in the recliner instead of the  wheelchair. Stand pivot transfer completed with maxA from w/c to recliner towards her weaker L side. Required maxA for repositioning. Pillows supporting her trunk for midline posture. BLE elevated, x2 sisters in the room. All needs met.   Therapy Documentation Precautions:  Precautions Precautions: Fall Recall of Precautions/Restrictions: Impaired Precaution/Restrictions Comments: Cortrak- NPO; dense L Hemi Restrictions Weight Bearing Restrictions Per Provider Order: No General:   Vital Signs: Oxygen Therapy SpO2: 96 % O2 Device: Room Air Pain: Pain Assessment Pain Scale: 0-10 Pain Score: 0-No pain Mobility:   Locomotion :    Trunk/Postural Assessment :    Balance:   Exercises:   Other Treatments:      Therapy/Group: Individual Therapy  Haskel Dewalt P Mills Mitton 07/22/2024, 11:12 AM

## 2024-07-22 NOTE — Plan of Care (Signed)
  Problem: Consults Goal: RH STROKE PATIENT EDUCATION Description: See Patient Education module for education specifics  Outcome: Progressing Goal: Nutrition Consult-if indicated Outcome: Progressing   Problem: RH BOWEL ELIMINATION Goal: RH STG MANAGE BOWEL WITH ASSISTANCE Description: STG Manage Bowel with min Assistance. Outcome: Progressing   Problem: RH BLADDER ELIMINATION Goal: RH STG MANAGE BLADDER WITH ASSISTANCE Description: STG Manage Bladder With min Assistance Outcome: Progressing   Problem: RH KNOWLEDGE DEFICIT Goal: RH STG INCREASE KNOWLEDGE OF DYSPHAGIA/FLUID INTAKE Description: Patient and family will be able to demonstrate understanding of dietary modifications to prevent complications related to stroke independently using handout provided.  Outcome: Progressing Goal: RH STG INCREASE KNOWLEGDE OF HYPERLIPIDEMIA Description: Patient and family will be able to demonstrate understanding of medication management and dietary modifications to better control cholesterol levels to prevent further occurrence of stroke independently using handout provided.  Outcome: Progressing Goal: RH STG INCREASE KNOWLEDGE OF STROKE PROPHYLAXIS Description: Patient and family will be able to demonstrate understanding of medication management and dietary modifications to better control cholesterol levels to prevent further occurrence of stroke independently using handout provided.  Outcome: Progressing   Problem: Education: Goal: Ability to describe self-care measures that may prevent or decrease complications (Diabetes Survival Skills Education) will improve Outcome: Progressing Goal: Individualized Educational Video(s) Outcome: Progressing   Problem: Coping: Goal: Ability to adjust to condition or change in health will improve Outcome: Progressing   Problem: Fluid Volume: Goal: Ability to maintain a balanced intake and output will improve Outcome: Progressing   Problem: Health  Behavior/Discharge Planning: Goal: Ability to identify and utilize available resources and services will improve Outcome: Progressing Goal: Ability to manage health-related needs will improve Outcome: Progressing   Problem: RH Vision Goal: RH LTG Vision (Specify) Outcome: Progressing

## 2024-07-22 NOTE — Progress Notes (Addendum)
 Nutrition Follow-up  DOCUMENTATION CODES:   Non-severe (moderate) malnutrition in context of social or environmental circumstances (aging, decline in function, poor appetite)  INTERVENTION:   Continue bolus feedings via G-tube: Osmolite 1.5 237 ml decrease to TID Prosource TF20 60 ml daily Free water  flushes 60 ml before and after each bolus feeding.  Provides: 1145 kcal, 65 gm protein, 903 ml free water  daily (TF + flushes).  Continue MVI with minerals daily via tube.  Education provided on pureed diet with nectar thick liquids.  NUTRITION DIAGNOSIS:   Moderate Malnutrition related to social / environmental circumstances (aging, decline in function, poor appetite) as evidenced by moderate fat depletion, mild muscle depletion; ongoing   GOAL:   Patient will meet greater than or equal to 90% of their needs; met with TF  MONITOR:   TF tolerance  REASON FOR ASSESSMENT:   Consult Enteral/tube feeding initiation and management, Assessment of nutrition requirement/status  ASSESSMENT:   Pt with PMH of HLD, PAD, COPD with emphysema, OSA, R breast ca s/p lumpectomy/XRT/hormone therapy admitted 7/5 for RUQ pain, N/V, and poor PO intake. Dx with acalculous cholecystitis and E.coli bacteremia. Recent admission 7/5 and discharged to [facility] 7/22  7/28 - S/P 18 Fr G-tube placement by IR  7/29 - Transitioned to gravity bolus feedings 8/7 - diet advanced to dysphagia 1 with nectar thick liquids. Meal intakes: 0-50%  Patient is tolerating bolus TF well; decreasing to 3 bolus feedings per day since she is on a PO diet now.   Daughter in room with patient and requested information regarding patient's pureed diet with thickened liquids at home. RD provided IDDSI Level 4 Pureed Nutrition Therapy and IDDSI Thickened Liquid Nutrition Therapy handouts from the [employer]. Daughter very appreciative of handouts provided.   Labs reviewed.   Medications reviewed and  include vitamin D3, Metanx, MVI with minerals, senna.  CIR admit weight: 67.2 kg Current weight: 66.3 kg  Diet Order:   Diet Order             DIET - DYS 1 Room service appropriate? Yes with Assist; Fluid consistency: Nectar Thick  Diet effective now                   EDUCATION NEEDS:   Education needs have been addressed  Skin:  Skin Assessment: Reviewed RN Assessment  Last BM:  8/11 type 6  Height:   Ht Readings from Last 1 Encounters:  07/08/24 5' 1 (1.549 m)    Weight:   Wt Readings from Last 1 Encounters:  07/28/24 66.3 kg    Ideal Body Weight:  47.8 kg  BMI:  Body mass index is 27.62 kg/m.  Estimated Nutritional Needs:   Kcal:  1400-1600  Protein:  70-85g  Fluid:  1.4-1.6L   Suzen HUNT RD, LDN, CNSC Contact via secure chat. If unavailable, use group chat RD Inpatient.

## 2024-07-22 NOTE — Progress Notes (Signed)
 Occupational Therapy Session Note  Patient Details  Name: [name] MRN: [id number] Date of Birth: Aug 30, 1932  Today's Date: 07/22/2024 OT Individual Time: 1300-1345 OT Individual Time Calculation (min): 45 min    Short Term Goals: Week 2:  OT Short Term Goal 1 (Week 2): Patient will actively lean forward in midline while sitting to prepare for sit to stand OT Short Term Goal 2 (Week 2): Patient will locate left arm on body in functional context - during bathing and dressing OT Short Term Goal 3 (Week 2): Patient will sit at edge of bed for 2-3 seconds using compensatory strategies and without external support.  Skilled Therapeutic Interventions/Progress Updates:    Patient agreeable to participate in OT session. Reports no pain level.   Patient participated in skilled OT session focusing on sitting balance, NMR on L side, body awareness, sit to stand, standing balance and transfer training. Patient able to complete static sitting fluctuating from CG to mod A. Patient able to sit with CG in 10-20 second bouts. Patient standing with mod A x2. Patient transfer with max A to strong side to wc. Patient total A to transfer to strong side to bed. Patient completed reaching activities with support. Patient utilized Ship broker as external visual cue, tactile cues, and verbal cues for proper postioning. Patient returned to bed all needs in reach alarm on,  Therapy Documentation Precautions:  Precautions Precautions: Fall Recall of Precautions/Restrictions: Impaired Precaution/Restrictions Comments: Cortrak- NPO; dense L Hemi Restrictions Weight Bearing Restrictions Per Provider Order: No  Therapy/Group: Individual Therapy  [name] 07/22/2024, 12:59 PM

## 2024-07-23 LAB — COMPREHENSIVE METABOLIC PANEL WITH GFR
ALT: 22 U/L (ref 0–44)
AST: 23 U/L (ref 15–41)
Albumin: 3.2 g/dL — ABNORMAL LOW (ref 3.5–5.0)
Alkaline Phosphatase: 75 U/L (ref 38–126)
Anion gap: 10 (ref 5–15)
BUN: 16 mg/dL (ref 8–23)
CO2: 25 mmol/L (ref 22–32)
Calcium: 9.2 mg/dL (ref 8.9–10.3)
Chloride: 102 mmol/L (ref 98–111)
Creatinine, Ser: 0.59 mg/dL (ref 0.44–1.00)
GFR, Estimated: >60 mL/min (ref >60)
Glucose, Bld: 111 mg/dL — ABNORMAL HIGH (ref 70–99)
Potassium: 3.8 mmol/L (ref 3.5–5.1)
Sodium: 137 mmol/L (ref 135–145)
Total Bilirubin: 0.7 mg/dL (ref 0.0–1.2)
Total Protein: 6.1 g/dL — ABNORMAL LOW (ref 6.5–8.1)

## 2024-07-23 MED ORDER — FLEET ENEMA RE ENEM: 1.0000 | ENEMA | Freq: Once | RECTAL | Status: DC

## 2024-07-23 MED ORDER — SORBITOL 70 % SOLN
30.0000 mL | Freq: Once | Status: AC
  Administered 2024-07-23: 30 mL via ORAL
  Filled 2024-07-23: qty 30

## 2024-07-23 NOTE — Progress Notes (Signed)
 Physical Therapy Session Note  Patient Details  Name: [name] MRN: [id number] Date of Birth: 05-31-1932  Today's Date: 07/23/2024 PT Missed Time: 45 Minutes Missed Time Reason: Toileting   Pt on bed pan and requesting time and privacy. Pt missed 45 minutes of therapy - will attempt to make up time as schedule and availability permits.      Therapy/Group: Individual Therapy  [name] 07/23/2024, 7:46 AM

## 2024-07-23 NOTE — Progress Notes (Signed)
 Patient ID: [name], female   DOB: [birth year], 89 y.o.   MRN: [id number]  Met with pt and [name]-daughter to update team conference progress this week and not able to extend discharge due to minimal progress. So discharge still 8/13. Discussed the need for daughter to learn Mom's care and prepare for discharge home. Will order DME and arrange follow up. [name] to set up caregiver assist and has private duty list. PT to set up speciality wheelchair evaluation. Work on discharge needs.

## 2024-07-23 NOTE — Progress Notes (Signed)
 Occupational Therapy Session Note  Patient Details  Name: [name] MRN: [id number] Date of Birth: 1932/01/09  Today's Date: 07/23/2024 OT Individual Time: 9199-9151 OT Individual Time Calculation (min): 48 min    Short Term Goals: Week 1:  OT Short Term Goal 1 (Week 1): Patient will sit with min assist x 5 seconds as needed prior to transfer OT Short Term Goal 1 - Progress (Week 1): Met OT Short Term Goal 2 (Week 1): Patient will actively lean forward in midline while sitting to prepare for sit to stand OT Short Term Goal 2 - Progress (Week 1): Not met OT Short Term Goal 3 (Week 1): Patient will visually locate item 15* left of midline with mod cueing OT Short Term Goal 3 - Progress (Week 1): Met OT Short Term Goal 4 (Week 1): Patient will locate left arm on body in functional context - during bathing and dressing OT Short Term Goal 4 - Progress (Week 1): Progressing toward goal  Skilled Therapeutic Interventions/Progress Updates:    Patient agreeable to participate in OT session. Reports decreased pain level. MD educated on release for jaw pain prior to OT arrival .  Patient participated in skilled OT session focusing on ADLs, functional mobility. Patient completed transfer to chair max A. UB/ LB bathing mod to max A. UB dressing max, LB dressing max to total. Footwear total. Patient demonstrated fair to poor balance with L sided lean. OT facilitated increased posture and decreased push. Sit to stand x4 with mod to max A.  Therapy Documentation Precautions:  Precautions Precautions: Fall Recall of Precautions/Restrictions: Impaired Precaution/Restrictions Comments: Cortrak- NPO; dense L Hemi Restrictions Weight Bearing Restrictions Per Provider Order: No   Therapy/Group: Individual Therapy  D'mariea L Nalini Alcaraz 07/23/2024, 7:59 AM

## 2024-07-23 NOTE — Progress Notes (Signed)
 Speech Language Pathology Daily Session Note  Patient Details  Name: [name] MRN: [id number] Date of Birth: Nov 18, 1932  Today's Date: 07/23/2024 SLP Individual Time: 1331-1404 SLP Individual Time Calculation (min): 33 min  Short Term Goals: Week 2: SLP Short Term Goal 1 (Week 2): Pt will consume ice chips and teaspoons of thin liquid demonstrating reduction in frequency of overt s/sx of penetration/aspiration by at least 25% to indicate appropriateness for repeated objective swallow study. SLP Short Term Goal 2 (Week 2): Patient will recall daily events with 90% accuracy using internal/external memory aids given min assist. SLP Short Term Goal 3 (Week 2): Patient will complete pharyngeal strengthening exercises as appropriate given min A in order to improve swallow function  Skilled Therapeutic Interventions: SLP conducted skilled therapy session targeting cognitive goals. SLP initially planning to facilitate completion of swallowing exercises and ice chip/thin liquid trials given plan to repeat MBS 8/7, however patient reporting 10/10 jaw pain and subsequently refusing all PO trials and completion of exercises. Patient further declining participation in any task requiring talking, but briefly agreeable to participate in cognitive tasks targeting L inattention via written modality to avoid utilization of jaw musculature. During tasks, patient required max and at times hand over hand assist to fully scan to the left. After 30 minutes, patient declined further participation in any tasks. Will schedule MBS as planned and assess jaw pain in the AM for appropriateness. Daughter reports concern re: patient completing MBS if jaw pain continues, however discussed importance of completing an updated study prior to discharge to assess for improvements since previous. Daughter verbalized understanding. Patient was left in room with call bell in reach and alarm set. SLP will continue to target goals per plan  of care.        Pain Pain Assessment Pain Scale: 0-10 Pain Score: 10-Worst pain ever Pain Location: Face Pain Intervention(s): Medication (See eMAR)  Therapy/Group: Individual Therapy  Yanai Hobson, M.A., CCC-SLP  Anquanette Bahner A Doreena Maulden 07/23/2024, 2:09 PM

## 2024-07-23 NOTE — Progress Notes (Signed)
 Physical Therapy Session Note  Patient Details  Name: [name] MRN: [id number] Date of Birth: Apr 02, 1932  Today's Date: 07/23/2024 PT Individual Time: 1035-1100 PT Individual Time Calculation (min): 25 min   Short Term Goals: Week 2:  PT Short Term Goal 1 (Week 2): Pt will complete bed mobility with maxA of 1 person PT Short Term Goal 2 (Week 2): Pt will complete bed<>chair transfers consistently at maxA level PT Short Term Goal 3 (Week 2): Pt will sit unsupported at EOB for 1 minute with modA  Skilled Therapeutic Interventions/Progress Updates:      Pt seen to make up missed time. Pt in bed at start with her daughter, [name], present at the bedside. Pt reports no pain. [name] asking about DC rec's, anticipated DME needs, and general DC planning. Reviewed PT goals, PT POC, recommended DME (hospital bed, hoyer lift, custom w/c), and need for 24/7 care. [name] understands level of care needed and willing to take patient home.   Bed mobility completed with maxA with hospital bed features. She needs mod/maxA for sitting balance due to L pushing. Squat pivot transfer achieved with mod/maxA towards her R side with over the back technique and cues for hand placement. Transported her to main gym and setup outside // bars to work on standing and balance. She needed +2 maxA for standing outside // bar and maxA for standing balance due to L pushing and poor awareness. L knee soft buckling in standing and patient having tolerance to stand for up to ~20 seconds per stand.   Patient returned to her room and was left sitting up in wheelchair - daughter at the bedside. All needs met at end.    Therapy Documentation Precautions:  Precautions Precautions: Fall Recall of Precautions/Restrictions: Impaired Precaution/Restrictions Comments: Cortrak- NPO; dense L Hemi Restrictions Weight Bearing Restrictions Per Provider Order: No   Therapy/Group: Individual Therapy  Taleigha Pinson P Omarius Grantham 07/23/2024,  11:12 AM

## 2024-07-23 NOTE — Progress Notes (Signed)
 Physical Therapy Weekly Progress Note  Patient Details  Name: [name] MRN: [id number] Date of Birth: 09-07-1932  Beginning of progress report period: July 16, 2024 End of progress report period: July 23, 2024  Patient has met 3 of 3 short term goals.  Pt has made limited functional progress this reporting period. She overall requires maxA for functional mobility including bed mobility and transfers. She is incontinent and unaware. She requires mod to maxA for static sitting balance. She's shown mild improvement in initiation and awareness of deficits. Per CSW and speaking with family, the DC plan remains to go home with daughter, [name], where 24/7 care will be needed. Will focus therapy sessions on more hands on training, caregiver education, and wheelchair evaluation to prepare for home.  Patient continues to demonstrate the following deficits muscle weakness and muscle joint tightness, decreased cardiorespiratoy endurance, impaired timing and sequencing, abnormal tone, unbalanced muscle activation, motor apraxia, decreased coordination, and decreased motor planning, decreased visual acuity and decreased visual perceptual skills, decreased midline orientation, decreased attention to left, and decreased motor planning, decreased initiation, decreased attention, decreased awareness, decreased problem solving, decreased safety awareness, decreased memory, and delayed processing, and decreased sitting balance, decreased standing balance, decreased postural control, hemiplegia, and decreased balance strategies and therefore will continue to benefit from skilled PT intervention to increase functional independence with mobility.  Patient not progressing toward long term goals.  See goal revision..  Plan of care revisions: See PT POC note for details. DC stairs, ambulation, and wheelchair goals. Downgraded to maxA.   PT Short Term Goals Week 2:  PT Short Term Goal 1 (Week 2): Pt will complete bed  mobility with maxA of 1 person PT Short Term Goal 2 (Week 2): Pt will complete bed<>chair transfers consistently at maxA level PT Short Term Goal 3 (Week 2): Pt will sit unsupported at EOB for 1 minute with modA Week 3:  PT Short Term Goal 1 (Week 3): STG = LTG    Therapy Documentation Precautions:  Precautions Precautions: Fall Recall of Precautions/Restrictions: Impaired Precaution/Restrictions Comments: Cortrak- NPO; dense L Hemi Restrictions Weight Bearing Restrictions Per Provider Order: No General:    Katanya Schlie P Yazen Rosko 07/23/2024, 7:47 AM

## 2024-07-23 NOTE — Progress Notes (Signed)
 PROGRESS NOTE   Subjective/Complaints:  Pt reports Feeling bad this AM and yesterday due to L jaw pain- very painful- concerned about an abscess. Jaw pain worse with chin tuck and head flexion.   Didn't have BM yesterday with sorbitol - 15cc given at 5pm yesterday- no results.    Also per daughter, scratched L side of nose? Has large scab there.  Pt reports has been practicing looking to L.    ROS:   Pt denies SOB, abd pain, CP, N/V/C/D, and vision changes Per HPI Objective:   No results found.    Recent Labs    07/21/24 0517  WBC 6.0  HGB 10.7*  HCT 33.3*  PLT 147*     Recent Labs    07/21/24 0517 07/23/24 0508  NA 140 137  K 4.4 3.8  CL 105 102  CO2 26 25  GLUCOSE 107* 111*  BUN 17 16  CREATININE 0.60 0.59  CALCIUM  9.1 9.2      Intake/Output Summary (Last 24 hours) at 07/23/2024 0826 Last data filed at 07/22/2024 2124 Gross per 24 hour  Intake 480 ml  Output 400 ml  Net 80 ml        Physical Exam: Vital Signs Blood pressure (!) 149/61, pulse 73, temperature 98.8 F (37.1 C), temperature source Oral, resp. rate 18, height 5' 1 (1.549 m), weight 62.1 kg, SpO2 97%.     General: awake, alert, appropriate, sitting up in bed; daughter at bedside;  NAD HENT: conjugate gaze; oropharynx dry- very few teeth- L jaw severe tightness with associated muscle spasms- turned to L to scan for first time that I've seen.  CV: regular rate and rhythm; no JVD Pulmonary: CTA B/L; no W/R/R- good air movement GI: soft, NT, ND, (+)BS- hypoactive-  Psychiatric: appropriate- flat Neurological: slightly delayed responses, but better  Skin: multiple bruises throughout skin esp on abd c/w lovenox  injections.        Comments:  RUE 5-/5 LUE 0/5 throughout RLE- 4+/5 throughout- hard to get pt to do exam LLE- PF 1/5- otherwise 0/5 in LLE, stable 8/1 Skin:    General: Skin is warm and dry.   Neurological:      Mental Status: She is alert.     Comments: Mild dysarthria with occasional wet voice. Left facial weakness with left inattention. Left HH and needs cues to look to left. A. She was able to answer basic questions, name and place, but thought it was SUnday , not Monday   Left hemiparesis with sensory deficits.  No clonus, no hoffman's  Flaccid tone on L side  Psychiatric:     Comments: Alert and smiling   Assessment/Plan: 1. Functional deficits which require 3+ hours per day of interdisciplinary therapy in a comprehensive inpatient rehab setting. Physiatrist is providing close team supervision and 24 hour management of active medical problems listed below. Physiatrist and rehab team continue to assess barriers to discharge/monitor patient progress toward functional and medical goals  Care Tool:  Bathing    Body parts bathed by patient: Face, Chest, Abdomen   Body parts bathed by helper: Right arm, Left arm, Front perineal area, Buttocks, Right upper leg, Left upper leg,  Right lower leg, Left lower leg     Bathing assist Assist Level: Maximal Assistance - Patient 24 - 49%     Upper Body Dressing/Undressing Upper body dressing   What is the patient wearing?: Pull over shirt    Upper body assist Assist Level: Moderate Assistance - Patient 50 - 74%    Lower Body Dressing/Undressing Lower body dressing      What is the patient wearing?: Pants, Incontinence brief     Lower body assist Assist for lower body dressing: Total Assistance - Patient < 25%     Toileting Toileting    Toileting assist Assist for toileting: 2 Helpers     Transfers Chair/bed transfer  Transfers assist  Chair/bed transfer activity did not occur: Safety/medical concerns (did not get up)  Chair/bed transfer assist level: Maximal Assistance - Patient 25 - 49%     Locomotion Ambulation   Ambulation assist   Ambulation activity did not occur: Safety/medical concerns          Walk 10 feet  activity   Assist  Walk 10 feet activity did not occur: Safety/medical concerns        Walk 50 feet activity   Assist Walk 50 feet with 2 turns activity did not occur: Safety/medical concerns         Walk 150 feet activity   Assist Walk 150 feet activity did not occur: Safety/medical concerns         Walk 10 feet on uneven surface  activity   Assist Walk 10 feet on uneven surfaces activity did not occur: Safety/medical concerns         Wheelchair     Assist Is the patient using a wheelchair?: Yes Type of Wheelchair: Manual    Wheelchair assist level: Dependent - Patient 0%      Wheelchair 50 feet with 2 turns activity    Assist        Assist Level: Dependent - Patient 0%   Wheelchair 150 feet activity     Assist      Assist Level: Dependent - Patient 0%   Blood pressure (!) 149/61, pulse 73, temperature 98.8 F (37.1 C), temperature source Oral, resp. rate 18, height 5' 1 (1.549 m), weight 62.1 kg, SpO2 97%.  Medical Problem List and Plan: 1. Functional deficits secondary to R MCA stroke             -patient may  shower             -ELOS/Goals: 16-20 days- min-mod A           Con't CIR PT, OT and SLP  Team conference Wednesday =will determine length of stay  D3 started, increase to 2,000U, metanx also ordered Discussed with therapy initiating Hoyer lift training as patient wants to go home  8/6- did myofascial release in jaw and mouth- feels much better  2.  Impaired mobility: -DVT/anticoagulation:  Pharmaceutical: continue Lovenox  40mg  daily             -antiplatelet therapy: Continue ASA.  --Plavix  recommended if no further procedures planned. PEG placed without complications  3. Muscle spasms in back: ccontinue lidocaine  patches. Tylenol  500mg  TID/HS, Baclofen  5mg  added. Voltaren  gel.   --sedated from Flexeril  5 mg/tramadol  50 mg. Will decrease doses. Decrease prn Tramadol  to 50mg  q8H prn, d/c flexeril   Continue  kpad  -d/c'd flexeril   8/3- Took lidoderm  patches off form yesterday  8/5- Denies pain this AM  8/6- L jaw pain- did  myofascial release- said was much better and could open mouth more after done.  4. Hospital induced delirium at night: daughter may stay over at night -Conitnue melatonin 5mg  at 8 pm tonight. Sleep wake chart to monitor sleep              -antipsychotic agents: N/A  5. Neuropsych/cognition: This patient may be intermittently capable of making decisions on her own behalf.  6. Daytime somnolence: modafinil  100mg  daily ordered  7. Dysphagia/dry mouth: NPO on PEG feeds 7/29, transitioned to bolus feeds, Osmolite bolus QID  continue ice chips  8.  E coli bacteremia: Treated with 10 day course of antibiotics. Ceftriaxone -->Duricef thru 07/14. Thrombocytopenia/leucocytosis has resolved.              --Hematuria has resolved. No dysuria.   9. Chronic back/Right knee pain: SEE #3 ABOVE; Continue voltaren  gel qid to knee and back till family bring in the biofreeze (which works better) --continue Lidocaine  patches at nights to help with back spasms. Decrease flexeril  to 2.5 mg every 8 hours prn-- now d/c'd as below             --schedule tylenol  500 mg qid as LFTs resolved.   10. Abnormal LFTs: Have resolved and Lipitor 10mg  daily added on 07/19. Continue to monitor weekly for  now.   8/5- will check CMP q Monday and BMP q Thursday- will check in AM since haven't rechecked in 1 week  8/6- LFTs have normalized 11. COPD/OSA: Has CPAP but not using PTA. Continue Yupelri  nebs daily (in place of Spiriva).  --continue albuterol  every night PTA.  .  12. Hypomagnesemia: Magnesium  reviewed and has normalized  13. Anemia: Monitor for signs of bleeding. H/H recovering from 13-->9.9-->11.2  8/6- Last Hb 10.7- down from 11.3- will recheck Thursday as ordered 14.  Bradycardia: HR note to be in 51 to 61 range and had boderline prolonged Qtc on admission. Likely due to stroke.  Repeat EKG  reviewed and is stable  15.  H/o MDD/anxiety: continue Lexapro  40mg  daily. Wellbutrin  started 75mg  BID  16. Bowel management: Last BM 8/1, continue to monitor, d/c suppository  8/5- Will give Sorbitol  15cc- and add Senna 1 tab daily- to help her go regularly- since on Tramadol  prn  8/6- no BM yet- will give Sorbitol  30cc and if no BM, will need Fleets enema  17. R jaw/tooth pain: Continue Augmentin  BID x5 days completed to treat any potential dental infection; needs to f/up outpatient with a dentist eventually.Benzocaine  ordered -CT maxillofacial neg on 7/31 8/6- did myofascial release- pt has good results.  18. Dry mouth: d/c flexeril     19. Hematoma on L lateral periorbital area, well circumscribed  8/3- cannot identify any trauma   8/5- Is spreading due to gravity/when sits up- educated daughter this is normal- she was a little concerned.  20.  Anemia- stable , plt borderline, cont to monitor     Latest Ref Rng & Units 07/21/2024    5:17 AM 07/17/2024    5:02 AM 07/14/2024    5:48 AM  CBC  WBC 4.0 - 10.5 K/uL 6.0  5.8  5.1   Hemoglobin 12.0 - 15.0 g/dL 89.2  88.6  88.5   Hematocrit 36.0 - 46.0 % 33.3  33.4  34.0   Platelets 150 - 400 K/uL 147  153  168     I spent a total of 52   minutes on total care today- >50% coordination of care- due to  D/w  pt and daughter- having improved L gaze- also bad L jaw pain- did myofascial release as well as trained daughter to do as well- -also d/w pt's nurse as well as OT- and discussed bowels as well   LOS: 15 days A FACE TO FACE EVALUATION WAS PERFORMED  Darold Miley 07/23/2024, 8:26 AM

## 2024-07-23 NOTE — Plan of Care (Signed)
  Problem: Consults Goal: RH STROKE PATIENT EDUCATION Description: See Patient Education module for education specifics  Outcome: Progressing Goal: Nutrition Consult-if indicated Outcome: Progressing   Problem: RH BOWEL ELIMINATION Goal: RH STG MANAGE BOWEL WITH ASSISTANCE Description: STG Manage Bowel with min Assistance. Outcome: Progressing   Problem: RH BLADDER ELIMINATION Goal: RH STG MANAGE BLADDER WITH ASSISTANCE Description: STG Manage Bladder With min Assistance Outcome: Progressing   Problem: RH KNOWLEDGE DEFICIT Goal: RH STG INCREASE KNOWLEDGE OF DYSPHAGIA/FLUID INTAKE Description: Patient and family will be able to demonstrate understanding of dietary modifications to prevent complications related to stroke independently using handout provided.  Outcome: Progressing Goal: RH STG INCREASE KNOWLEGDE OF HYPERLIPIDEMIA Description: Patient and family will be able to demonstrate understanding of medication management and dietary modifications to better control cholesterol levels to prevent further occurrence of stroke independently using handout provided.  Outcome: Progressing Goal: RH STG INCREASE KNOWLEDGE OF STROKE PROPHYLAXIS Description: Patient and family will be able to demonstrate understanding of medication management and dietary modifications to better control cholesterol levels to prevent further occurrence of stroke independently using handout provided.  Outcome: Progressing   Problem: Education: Goal: Ability to describe self-care measures that may prevent or decrease complications (Diabetes Survival Skills Education) will improve Outcome: Progressing Goal: Individualized Educational Video(s) Outcome: Progressing   Problem: Coping: Goal: Ability to adjust to condition or change in health will improve Outcome: Progressing   Problem: Fluid Volume: Goal: Ability to maintain a balanced intake and output will improve Outcome: Progressing   Problem: Health  Behavior/Discharge Planning: Goal: Ability to identify and utilize available resources and services will improve Outcome: Progressing Goal: Ability to manage health-related needs will improve Outcome: Progressing   Problem: Metabolic: Goal: Ability to maintain appropriate glucose levels will improve Outcome: Progressing   Problem: Nutritional: Goal: Maintenance of adequate nutrition will improve Outcome: Progressing Goal: Progress toward achieving an optimal weight will improve Outcome: Progressing   Problem: Skin Integrity: Goal: Risk for impaired skin integrity will decrease Outcome: Progressing   Problem: Tissue Perfusion: Goal: Adequacy of tissue perfusion will improve Outcome: Progressing   Problem: RH Vision Goal: RH LTG Vision (Specify) Outcome: Progressing

## 2024-07-23 NOTE — Patient Care Conference (Signed)
 Inpatient RehabilitationTeam Conference and Plan of Care Update Date: 07/23/2024   Time: 11:12 AM    Patient Name: [name]      Medical Record Number: [id number]  Date of Birth: Sep 21, 1932 Sex: Female         Room/Bed: 4W26C/4W26C-01 Payor Info: Payor: [employer] MEDICARE / Plan: [employer] MEDICARE CHOICE PPO / Product Type: *No Product type* /    Admit Date/Time:  07/08/2024  3:21 PM  Primary Diagnosis:  Acute ischemic right MCA stroke [facility])  Hospital Problems: Principal Problem:   Acute ischemic right MCA stroke [facility]) Active Problems:   Moderate malnutrition [facility])    Expected Discharge Date: Expected Discharge Date: 07/30/24  Team Members Present: Physician leading conference: Dr. Murray Collier Social Worker Present: Rhoda Clement, LCSW Nurse Present: Barnie Ronde, RN PT Present: Sherlean Perks, PT OT Present: Katheryn Mines, OT SLP Present: [name], SLP     Current Status/Progress Goal Weekly Team Focus  Bowel/Bladder      Incontinent of bowel and bladder    Bowel and bladder managed with mod assist    Timed toileting  Swallow/Nutrition/ Hydration   NPO with PEG, tolerating teaspoons of thin liquids more consistently though limited by fear   modA  repeating MBS, pharyngeal strengtheining exercises, PO trials, EMST    ADL's   total assist lower body, max/mod assist upper body, Pushes strongly to left   Down grade goals to max   caregiver training, discharge planning, sitting balance, safety    Mobility   maxA bed mobility, modA sitting balance, maxA transfers, non ambulatory   modA  bed mobility, functional transfers, sitting balance, endurnace, beginning family training if plan is to return home. Schedule w/c consult.    Communication                Safety/Cognition/ Behavioral Observations  modA for left attention, min-mod for recall of daily events, min to mod for problem solving in basic functional scenarios.   minA   Short term memory  strategies, orientation with external aids, basic problem solving, L attention    Pain      Pain managed with prn meds    Pain < 4 with prns    Assess pain q shift and effectiveness of prn meds  Skin      MASD to buttocks, treated  MASD healing     Gerhardt's cream to MASD    Discharge Planning:  [name]-daughter wants to take home due to trying to follow pt's wishes, other daughter's want to pursue SNF. Will need to begin family training and daughter working on fixing ramp at home and widening door ways. Will need to order DME and [facility]   Team Discussion: Patient was admitted post right MCA stroke. Patient with UTI/ back pain/right Jaw pain: medication adjusted by MD and myofacial release. Patient is NPO w/ peg tube. Progress limited by poor endurance and activity tolerance.   Patient on target to meet rehab goals: Focus changed to care-giver education for discharge. Patient needs max - total assist for supine - sit transfers due to left lean and pushing.  Needs max assist for squat pivot and sit - stand transfers.  Requires total assist for lower body care/toileting and max assist for upper body care.    *See Care Plan and progress notes for long and short-term goals.   Revisions to Treatment Plan:  Downgraded goals to max assist overall EMST Pharyngeal exercises Po trials with SLP   Teaching Needs: Safety, skin  care, PEG management, medications and administration of tube feedings and medications via PEG, transfers, toileting, equipment use, etc.   Current Barriers to Discharge: Home enviroment access/layout, Incontinence, Lack of/limited family support, and Nutritional means  Possible Resolutions to Barriers: Family education Ramp for entry to home DME: w/c, hoyer lift, hospital bed     Medical Summary Current Status: R MCA CVA, jaw pain, elevated LFTs, anemia, bradycardia  Barriers to Discharge: Incontinence;Self-care education;Medical stability;Uncontrolled Pain   Barriers to Discharge Comments: R MCA CVA, jaw pain, anemia, bradycardia Possible Resolutions to [employer] Focus: myofascial release today, monitor CBC, monitor HR, Equipment needs   Continued Need for Acute Rehabilitation Level of Care: The patient requires daily medical management by a physician with specialized training in physical medicine and rehabilitation for the following reasons: Direction of a multidisciplinary physical rehabilitation program to maximize functional independence : Yes Medical management of patient stability for increased activity during participation in an intensive rehabilitation regime.: Yes Analysis of laboratory values and/or radiology reports with any subsequent need for medication adjustment and/or medical intervention. : Yes   I attest that I was present, lead the team conference, and concur with the assessment and plan of the team.   Fredericka Sober B 07/23/2024, 2:27 PM

## 2024-07-24 ENCOUNTER — Inpatient Hospital Stay (HOSPITAL_COMMUNITY)

## 2024-07-24 LAB — BASIC METABOLIC PANEL WITH GFR
Anion gap: 9 (ref 5–15)
BUN: 16 mg/dL (ref 8–23)
CO2: 28 mmol/L (ref 22–32)
Calcium: 9.2 mg/dL (ref 8.9–10.3)
Chloride: 100 mmol/L (ref 98–111)
Creatinine, Ser: 0.62 mg/dL (ref 0.44–1.00)
GFR, Estimated: >60 mL/min (ref >60)
Glucose, Bld: 95 mg/dL (ref 70–99)
Potassium: 4.2 mmol/L (ref 3.5–5.1)
Sodium: 137 mmol/L (ref 135–145)

## 2024-07-24 LAB — CBC
HCT: 34.6 % — ABNORMAL LOW (ref 36.0–46.0)
Hemoglobin: 11.5 g/dL — ABNORMAL LOW (ref 12.0–15.0)
MCH: 31.8 pg (ref 26.0–34.0)
MCHC: 33.2 g/dL (ref 30.0–36.0)
MCV: 95.6 fL (ref 80.0–100.0)
Platelets: 156 K/uL (ref 150–400)
RBC: 3.62 MIL/uL — ABNORMAL LOW (ref 3.87–5.11)
RDW: 14.6 % (ref 11.5–15.5)
WBC: 8 K/uL (ref 4.0–10.5)
nRBC: 0 % (ref 0.0–0.2)

## 2024-07-24 MED ORDER — L-METHYLFOLATE-B6-B12 3-35-2 MG PO TABS
1.0000 | ORAL_TABLET | Freq: Two times a day (BID) | ORAL | Status: DC
  Administered 2024-07-24 – 2024-08-01 (×22): 1 via ORAL
  Filled 2024-07-24 (×16): qty 1

## 2024-07-24 MED ORDER — LIDOCAINE HCL 1 % IJ SOLN
10.0000 mL | Freq: Once | INTRAMUSCULAR | Status: AC
  Administered 2024-07-24: 10 mL
  Filled 2024-07-24: qty 10

## 2024-07-24 MED ORDER — ACETAMINOPHEN 500 MG PO TABS
500.0000 mg | ORAL_TABLET | Freq: Three times a day (TID) | ORAL | Status: DC
  Administered 2024-07-24 – 2024-08-01 (×43): 500 mg via ORAL
  Filled 2024-07-24 (×32): qty 1

## 2024-07-24 MED ORDER — GUAIFENESIN-DM 100-10 MG/5ML PO SYRP: 5.0000 mL | ORAL_SOLUTION | Freq: Four times a day (QID) | ORAL | Status: DC | PRN

## 2024-07-24 MED ORDER — MODAFINIL 100 MG PO TABS
100.0000 mg | ORAL_TABLET | Freq: Every day | ORAL | Status: DC
  Administered 2024-07-25 – 2024-08-01 (×11): 100 mg via ORAL
  Filled 2024-07-24 (×8): qty 1

## 2024-07-24 MED ORDER — BUSPIRONE HCL 10 MG PO TABS
5.0000 mg | ORAL_TABLET | Freq: Three times a day (TID) | ORAL | Status: DC | PRN
  Administered 2024-07-24 – 2024-07-31 (×11): 5 mg via ORAL
  Filled 2024-07-24 (×8): qty 1

## 2024-07-24 MED ORDER — CLOPIDOGREL BISULFATE 75 MG PO TABS
75.0000 mg | ORAL_TABLET | Freq: Every day | ORAL | Status: DC
  Administered 2024-07-25 – 2024-07-28 (×5): 75 mg via ORAL
  Filled 2024-07-24 (×4): qty 1

## 2024-07-24 MED ORDER — BACLOFEN 5 MG HALF TABLET
5.0000 mg | ORAL_TABLET | Freq: Every day | ORAL | Status: DC
  Administered 2024-07-24 – 2024-07-31 (×11): 5 mg via ORAL
  Filled 2024-07-24 (×8): qty 1

## 2024-07-24 MED ORDER — ADULT MULTIVITAMIN W/MINERALS CH
1.0000 | ORAL_TABLET | Freq: Every day | ORAL | Status: DC
  Administered 2024-07-25 – 2024-08-01 (×11): 1 via ORAL
  Filled 2024-07-24 (×8): qty 1

## 2024-07-24 MED ORDER — PROCHLORPERAZINE EDISYLATE 10 MG/2ML IJ SOLN: 5.0000 mg | Freq: Four times a day (QID) | INTRAMUSCULAR | Status: DC | PRN

## 2024-07-24 MED ORDER — ASPIRIN 81 MG PO CHEW
81.0000 mg | CHEWABLE_TABLET | Freq: Every day | ORAL | Status: DC
  Administered 2024-07-25 – 2024-08-01 (×11): 81 mg via ORAL
  Filled 2024-07-24 (×8): qty 1

## 2024-07-24 MED ORDER — PROCHLORPERAZINE 25 MG RE SUPP: 12.5000 mg | Freq: Four times a day (QID) | RECTAL | Status: DC | PRN

## 2024-07-24 MED ORDER — TRAMADOL HCL 50 MG PO TABS
25.0000 mg | ORAL_TABLET | Freq: Two times a day (BID) | ORAL | Status: DC
  Administered 2024-07-25 – 2024-08-01 (×22): 25 mg via ORAL
  Filled 2024-07-24 (×17): qty 1

## 2024-07-24 MED ORDER — VITAMIN D 25 MCG (1000 UNIT) PO TABS
2000.0000 [IU] | ORAL_TABLET | Freq: Every day | ORAL | Status: DC
  Administered 2024-07-25 – 2024-08-01 (×11): 2000 [IU] via ORAL
  Filled 2024-07-24 (×8): qty 2

## 2024-07-24 MED ORDER — POLYETHYLENE GLYCOL 3350 17 G PO PACK: 17.0000 g | PACK | Freq: Every day | ORAL | Status: DC | PRN

## 2024-07-24 MED ORDER — TRAMADOL HCL 50 MG PO TABS
50.0000 mg | ORAL_TABLET | ORAL | Status: DC | PRN
  Administered 2024-07-24 – 2024-08-01 (×10): 50 mg via ORAL
  Filled 2024-07-24 (×8): qty 1

## 2024-07-24 MED ORDER — BACLOFEN 5 MG/5ML PO SOLN
2.5000 mg | Freq: Three times a day (TID) | ORAL | Status: DC | PRN
  Administered 2024-07-25 (×2): 2.5 mg via ORAL
  Filled 2024-07-24 (×5): qty 2.5

## 2024-07-24 MED ORDER — PROCHLORPERAZINE MALEATE 5 MG PO TABS
5.0000 mg | ORAL_TABLET | Freq: Four times a day (QID) | ORAL | Status: DC | PRN
  Administered 2024-07-27: 10 mg via ORAL
  Filled 2024-07-24: qty 2

## 2024-07-24 MED ORDER — ESCITALOPRAM OXALATE 10 MG PO TABS
40.0000 mg | ORAL_TABLET | Freq: Every day | ORAL | Status: DC
  Administered 2024-07-25 – 2024-08-01 (×11): 40 mg via ORAL
  Filled 2024-07-24 (×8): qty 4

## 2024-07-24 MED ORDER — ATORVASTATIN CALCIUM 10 MG PO TABS
10.0000 mg | ORAL_TABLET | Freq: Every day | ORAL | Status: DC
  Administered 2024-07-25 – 2024-08-01 (×11): 10 mg via ORAL
  Filled 2024-07-24 (×8): qty 1

## 2024-07-24 MED ORDER — ACETAMINOPHEN-CODEINE 300-30 MG PO TABS: 1.0000 | ORAL_TABLET | Freq: Four times a day (QID) | ORAL | Status: DC | PRN

## 2024-07-24 NOTE — Plan of Care (Signed)
 Goals downgraded due to severity of patient's physical and perceptual deficits.   Problem: RH Balance Goal: LTG: Patient will maintain dynamic sitting balance (OT) Description: LTG:  Patient will maintain dynamic sitting balance with assistance during activities of daily living (OT) Flowsheets (Taken 07/24/2024 1211) LTG: Pt will maintain dynamic sitting balance during ADLs with: Maximal Assistance - Patient 25 - 49% Goal: LTG Patient will maintain dynamic standing with ADLs (OT) Description: LTG:  Patient will maintain dynamic standing balance with assist during activities of daily living (OT)  Outcome: Not Applicable   Problem: Sit to Stand Goal: LTG:  Patient will perform sit to stand in prep for activites of daily living with assistance level (OT) Description: LTG:  Patient will perform sit to stand in prep for activites of daily living with assistance level (OT) Flowsheets (Taken 07/24/2024 1255) LTG: PT will perform sit to stand in prep for activites of daily living with assistance level: Maximal Assistance - Patient 25 - 49%   Problem: RH Grooming Goal: LTG Patient will perform grooming w/assist,cues/equip (OT) Description: LTG: Patient will perform grooming with assist, with/without cues using equipment (OT) Flowsheets (Taken 07/24/2024 1255) LTG: Pt will perform grooming with assistance level of: Moderate Assistance - Patient 50 - 74%   Problem: RH Bathing Goal: LTG Patient will bathe all body parts with assist levels (OT) Description: LTG: Patient will bathe all body parts with assist levels (OT) Flowsheets (Taken 07/24/2024 1255) LTG: Pt will perform bathing with assistance level/cueing: Maximal Assistance - Patient 25 - 49%   Problem: RH Dressing Goal: LTG Patient will perform upper body dressing (OT) Description: LTG Patient will perform upper body dressing with assist, with/without cues (OT). Flowsheets (Taken 07/24/2024 1255) LTG: Pt will perform upper body dressing with  assistance level of: Moderate Assistance - Patient 50 - 74% Goal: LTG Patient will perform lower body dressing w/assist (OT) Description: LTG: Patient will perform lower body dressing with assist, with/without cues in positioning using equipment (OT) Flowsheets (Taken 07/24/2024 1255) LTG: Pt will perform lower body dressing with assistance level of: Maximal Assistance - Patient 25 - 49%   Problem: RH Toileting Goal: LTG Patient will perform toileting task (3/3 steps) with assistance level (OT) Description: LTG: Patient will perform toileting task (3/3 steps) with assistance level (OT)  Flowsheets (Taken 07/24/2024 1255) LTG: Pt will perform toileting task (3/3 steps) with assistance level: Total Assistance - Patient < 25%   Problem: RH Toileting Goal: LTG Patient will perform toileting task (3/3 steps) with assistance level (OT) Description: LTG: Patient will perform toileting task (3/3 steps) with assistance level (OT)  Flowsheets (Taken 07/24/2024 1255) LTG: Pt will perform toileting task (3/3 steps) with assistance level: Total Assistance - Patient < 25%   Problem: RH Functional Use of Upper Extremity Goal: LTG Patient will use RT/LT upper extremity as a (OT) Description: LTG: Patient will use right/left upper extremity as a stabilizer/gross assist/diminished/nondominant/dominant level with assist, with/without cues during functional activity (OT) Flowsheets (Taken 07/24/2024 1255) LTG: Pt will use upper extremity in functional activity with assistance level of: Moderate Assistance - Patient 50 - 74%   Problem: RH Tub/Shower Transfers Goal: LTG Patient will perform tub/shower transfers w/assist (OT) Description: LTG: Patient will perform tub/shower transfers with assist, with/without cues using equipment (OT) Outcome: Not Applicable

## 2024-07-24 NOTE — Progress Notes (Addendum)
 PROGRESS NOTE   Subjective/Complaints:  Pt reports L jaw pain much better this AM- but notes it burns when she does the SLP exercises.  Sets it on fire.  LBM yesterday- thinks got meds to go? Sleepy this Am-  ROS:   Pt denies SOB, abd pain, CP, N/V/C/D, and vision changes  Per HPI Objective:   DG Swallowing Func-Speech Pathology Result Date: 07/24/2024 Table formatting from the original result was not included. Modified Barium Swallow Study Patient Details Name: PRIMA RAYNER MRN: [id number] Date of Birth: [birth year] Today's Date: 07/24/2024 HPI/PMH: HPI: SERI KIMMER is an 89 yo female presenting to ED 7/5 for RUQ pain, nausea/vomiting. Found to have suspected cholecystitis vs passed choledocholithiasis and acute liver injury, hyponatremia, bacteriuria, and hematuria. Initially seen at [facility] ED 7/3 with n/v and elevated LFTs but left without being seen. Daughter noted sudden onset AMS and L sided weakness 7/9 with CTA showing abrupt occlusion of distal R MCA M1 s/p TNK. Taken to IR with unsuccessful revascularization (repeated reocclusion), post-procedure CTH showed small SAH. Remained intubated after the procedure, 7/9-7/10. PMH includes HLD, PAD, COPD with emphysema, OSA, R breast cancer, back pain, esophageal spasm Clinical Impression: Clinical Impression: Pt exhibits markedly improved oropharyngeal dysphagia compared to previous MBS with aspiration only observed with thin liquids due to mild timing deficits of pharyngeal structure movement. With thin liquids from cup, patient took very small sips (often smaller than teaspoon), however when prompted to take a larger sip exhibited transient aspiration where material moved down the posterior wall of the laryngeal vestibule and below the vocal folds, then back out. However, residue remained in the vestibule and was aspirated audibly during oral preparation of solid.  Despite attempts to  clear material from the trachea, cough was unsuccessful. No significant penetration/aspiration observed with all other trials and no significant residue observed across consistencies other than noted. Patient grabbed jaw during chewing of Dys3 solids, and given acute jaw pain present since yesterday, recommend initiation of Dys1/NTL diet to limit chewing. As patient's jaw pain improves, will be able to upgrade diet. Administer medications whole in puree. SLP will continue to follow to target increased cough strength and manage diet upgrade as able. Factors that may increase risk of adverse event in presence of aspiration [name] & [name] 2021): Factors that may increase risk of adverse event in presence of aspiration [name] & [name] 2021): Reduced cognitive function; Limited mobility; Frail or deconditioned Recommendations/Plan: Swallowing Evaluation Recommendations Swallowing Evaluation Recommendations Recommendations: PO diet PO Diet Recommendation: Dysphagia 1 (Pureed); Mildly thick liquids (Level 2, nectar thick) Liquid Administration via: Cup; Spoon; Straw Medication Administration: Whole meds with puree Supervision: Full supervision/cueing for swallowing strategies Swallowing strategies  : Minimize environmental distractions; Slow rate; Small bites/sips Postural changes: Position pt fully upright for meals Oral care recommendations: Oral care BID (2x/day) Caregiver Recommendations: Avoid jello, ice cream, thin soups, popsicles; Remove water  pitcher Treatment Plan Treatment Plan Treatment recommendations: Therapy as outlined in treatment plan below Follow-up recommendations: Home health SLP Functional status assessment: Patient has had a recent decline in their functional status and demonstrates the ability to make significant improvements in function in a reasonable and predictable amount of  time. Treatment frequency: Min 2x/week Treatment duration: 2 weeks Interventions: Aspiration precaution training;  Oropharyngeal exercises; Compensatory techniques; Patient/family education; Trials of upgraded texture/liquids Recommendations Recommendations for follow up therapy are one component of a multi-disciplinary discharge planning process, led by the attending physician.  Recommendations may be updated based on patient status, additional functional criteria and insurance authorization. Assessment: Orofacial Exam: Orofacial Exam Oral Cavity: Oral Hygiene: WFL Oral Cavity - Dentition: Dentures, not available; Poor condition; Missing dentition Orofacial Anatomy: WFL Oral Motor/Sensory Function: Suspected cranial nerve impairment CN V - Trigeminal: WFL CN VII - Facial: Left motor impairment CN IX - Glossopharyngeal, CN X - Vagus: Left motor impairment CN XII - Hypoglossal: Left motor impairment Anatomy: Anatomy: Suspected cervical osteophytes; Prominent cricopharyngeus Boluses Administered: Boluses Administered Boluses Administered: Thin liquids (Level 0); Mildly thick liquids (Level 2, nectar thick); Moderately thick liquids (Level 3, honey thick); Puree; Solid  Oral Impairment Domain: Oral Impairment Domain Lip Closure: No labial escape Tongue control during bolus hold: Cohesive bolus between tongue to palatal seal Bolus preparation/mastication: Slow prolonged chewing/mashing with complete recollection Bolus transport/lingual motion: Slow tongue motion Oral residue: Trace residue lining oral structures Location of oral residue : Tongue; Palate Initiation of pharyngeal swallow : Pyriform sinuses  Pharyngeal Impairment Domain: Pharyngeal Impairment Domain Soft palate elevation: No bolus between soft palate (SP)/pharyngeal wall (PW) Laryngeal elevation: Complete superior movement of thyroid  cartilage with complete approximation of arytenoids to epiglottic petiole Anterior hyoid excursion: Complete anterior movement Epiglottic movement: Complete inversion Laryngeal vestibule closure: Complete, no air/contrast in laryngeal  vestibule Pharyngeal stripping wave : Present - complete Pharyngeal contraction (A/P view only): N/A Pharyngoesophageal segment opening: Complete distension and complete duration, no obstruction of flow Tongue base retraction: Trace column of contrast or air between tongue base and PPW Pharyngeal residue: Trace residue within or on pharyngeal structures Location of pharyngeal residue: Valleculae; Pyriform sinuses  Esophageal Impairment Domain: No data recorded Pill: No data recorded Penetration/Aspiration Scale Score: Penetration/Aspiration Scale Score 1.  Material does not enter airway: Mildly thick liquids (Level 2, nectar thick); Moderately thick liquids (Level 3, honey thick); Puree; Solid 7.  Material enters airway, passes BELOW cords and not ejected out despite cough attempt by patient: Thin liquids (Level 0) Compensatory Strategies: Compensatory Strategies Compensatory strategies: No   General Information: Caregiver present: No  Diet Prior to this Study: NPO; G-tube   Temperature : Normal   Respiratory Status: WFL   Supplemental O2: None (Room air)   History of Recent Intubation: Yes  Behavior/Cognition: Alert; Cooperative; Requires cueing Self-Feeding Abilities: Needs assist with self-feeding Baseline vocal quality/speech: Normal Volitional Cough: Able to elicit Volitional Swallow: Able to elicit Exam Limitations: No limitations Goal Planning: Prognosis for improved oropharyngeal function: Good Barriers to Reach Goals: Cognitive deficits No data recorded Patient/Family Stated Goal: continued improvement Consulted and agree with results and recommendations: Patient Pain: Pain Assessment Pain Assessment: Faces Faces Pain Scale: 0 End of Session: Start Time:No data recorded Stop Time: No data recorded Time Calculation:No data recorded Charges: No data recorded SLP visit diagnosis: SLP Visit Diagnosis: Dysphagia, oropharyngeal phase (R13.12) Past Medical History: Past Medical History: Diagnosis Date  Anxiety    Back pain   Cancer (HCC)   Depression   Emphysema   Esophageal spasm   Lung nodule   OSA (obstructive sleep apnea)   Osteomalacia   Personal history of radiation therapy   Vertigo   Vitamin D  deficiency  Past Surgical History: Past Surgical History: Procedure Laterality Date  ABDOMINAL HYSTERECTOMY  APPENDECTOMY    BREAST BIOPSY Right 08/31/2020  BREAST EXCISIONAL BIOPSY Left 1978  BREAST LUMPECTOMY Right 09/30/2020  BREAST LUMPECTOMY WITH RADIOACTIVE SEED LOCALIZATION Right 09/30/2020  Procedure: RIGHT BREAST LUMPECTOMY WITH RADIOACTIVE SEED LOCALIZATION;  Surgeon: Curvin Deward MOULD, MD;  Location: [facility];  Service: General;  Laterality: Right;  CARPAL TUNNEL RELEASE    IR CT HEAD LTD  06/25/2024  IR GASTROSTOMY TUBE MOD SED  07/14/2024  IR PERCUTANEOUS ART THROMBECTOMY/INFUSION INTRACRANIAL INC DIAG ANGIO  06/25/2024  RADIOLOGY WITH ANESTHESIA N/A 06/25/2024  Procedure: RADIOLOGY WITH ANESTHESIA;  Surgeon: Dolphus Carrion, MD;  Location: [facility] OR;  Service: Radiology;  Laterality: N/A;  ROTATOR CUFF REPAIR   Rosina DELENA Downy 07/24/2024, 9:53 AM     Recent Labs    07/24/24 0451  WBC 8.0  HGB 11.5*  HCT 34.6*  PLT 156     Recent Labs    07/23/24 0508 07/24/24 0451  NA 137 137  K 3.8 4.2  CL 102 100  CO2 25 28  GLUCOSE 111* 95  BUN 16 16  CREATININE 0.59 0.62  CALCIUM  9.2 9.2      Intake/Output Summary (Last 24 hours) at 07/24/2024 1019 Last data filed at 07/24/2024 0810 Gross per 24 hour  Intake 480 ml  Output 250 ml  Net 230 ml        Physical Exam: Vital Signs Blood pressure (!) 155/56, pulse 70, temperature 98.6 F (37 C), temperature source Oral, resp. rate 18, height 5' 1 (1.549 m), weight 64.2 kg, SpO2 98%.      General: awake, alert, appropriate, woke from sleep- sleepy- sitting up in bed, NAD HENT: conjugate gaze; oropharynx dry-few teeth- less TTP and muscle spasm in L masseter- L eyelid hematoma is healing some CV: regular rate and rhythm; no  JVD Pulmonary: CTA B/L; no W/R/R- good air movement GI: soft, NT, ND, (+)BS Psychiatric: appropriate Neurological: delayed responses, but much more able to communicate-  Skin: multiple bruises throughout skin esp on abd c/w lovenox  injections.        Comments:  RUE 5-/5 LUE 0/5 throughout RLE- 4+/5 throughout- hard to get pt to do exam LLE- PF 1/5- otherwise 0/5 in LLE, stable 8/1 Skin:    General: Skin is warm and dry.   Neurological:     Mental Status: She is alert.     Comments: Mild dysarthria with occasional wet voice. Left facial weakness with left inattention. Left HH and needs cues to look to left. A. She was able to answer basic questions, name and place, but thought it was SUnday , not Monday   Left hemiparesis with sensory deficits.  No clonus, no hoffman's  Flaccid tone on L side  Psychiatric:     Comments: Alert and smiling   Assessment/Plan: 1. Functional deficits which require 3+ hours per day of interdisciplinary therapy in a comprehensive inpatient rehab setting. Physiatrist is providing close team supervision and 24 hour management of active medical problems listed below. Physiatrist and rehab team continue to assess barriers to discharge/monitor patient progress toward functional and medical goals  Care Tool:  Bathing    Body parts bathed by patient: Face, Chest, Abdomen   Body parts bathed by helper: Right arm, Left arm, Front perineal area, Buttocks, Right upper leg, Left upper leg, Right lower leg, Left lower leg     Bathing assist Assist Level: Maximal Assistance - Patient 24 - 49%     Upper Body Dressing/Undressing Upper body dressing  What is the patient wearing?: Pull over shirt    Upper body assist Assist Level: Moderate Assistance - Patient 50 - 74%    Lower Body Dressing/Undressing Lower body dressing      What is the patient wearing?: Pants, Incontinence brief     Lower body assist Assist for lower body dressing: Total Assistance  - Patient < 25%     Toileting Toileting    Toileting assist Assist for toileting: 2 Helpers     Transfers Chair/bed transfer  Transfers assist  Chair/bed transfer activity did not occur: Safety/medical concerns (did not get up)  Chair/bed transfer assist level: Maximal Assistance - Patient 25 - 49%     Locomotion Ambulation   Ambulation assist   Ambulation activity did not occur: Safety/medical concerns          Walk 10 feet activity   Assist  Walk 10 feet activity did not occur: Safety/medical concerns        Walk 50 feet activity   Assist Walk 50 feet with 2 turns activity did not occur: Safety/medical concerns         Walk 150 feet activity   Assist Walk 150 feet activity did not occur: Safety/medical concerns         Walk 10 feet on uneven surface  activity   Assist Walk 10 feet on uneven surfaces activity did not occur: Safety/medical concerns         Wheelchair     Assist Is the patient using a wheelchair?: Yes Type of Wheelchair: Manual    Wheelchair assist level: Dependent - Patient 0%      Wheelchair 50 feet with 2 turns activity    Assist        Assist Level: Dependent - Patient 0%   Wheelchair 150 feet activity     Assist      Assist Level: Dependent - Patient 0%   Blood pressure (!) 155/56, pulse 70, temperature 98.6 F (37 C), temperature source Oral, resp. rate 18, height 5' 1 (1.549 m), weight 64.2 kg, SpO2 98%.  Medical Problem List and Plan: 1. Functional deficits secondary to R MCA stroke             -patient may  shower             -ELOS/Goals: 16-20 days- min-mod A           Con't CIR PT, OT and SLP  Team conference Wednesday =will determine length of stay  D3 started, increase to 2,000U, metanx also ordered Discussed with therapy initiating Hoyer lift training as patient wants to go home  Con't CIR PT, OT and SLP- d/w SLP x2 different therapists 2.  Impaired  mobility: -DVT/anticoagulation:  Pharmaceutical: continue Lovenox  40mg  daily             -antiplatelet therapy: Continue ASA.  --Plavix  recommended if no further procedures planned. PEG placed without complications  3. Muscle spasms in back: ccontinue lidocaine  patches. Tylenol  500mg  TID/HS, Baclofen  5mg  added. Voltaren  gel.   --sedated from Flexeril  5 mg/tramadol  50 mg. Will decrease doses. Decrease prn Tramadol  to 50mg  q8H prn, d/c flexeril   Continue kpad  -d/c'd flexeril   8/3- Took lidoderm  patches off form yesterday  8/5- Denies pain this AM  8/6- L jaw pain- did myofascial release- said was much better and could open mouth more after done. 8/7- doing better today per pt  4. Hospital induced delirium at night: daughter may stay over at night -Conitnue melatonin  5mg  at 8 pm tonight. Sleep wake chart to monitor sleep              -antipsychotic agents: N/A  8/7- much more able to tel me what's going on 5. Neuropsych/cognition: This patient may be intermittently capable of making decisions on her own behalf.  6. Daytime somnolence: modafinil  100mg  daily ordered  7. Dysphagia/dry mouth: NPO on PEG feeds 7/29, transitioned to bolus feeds, Osmolite bolus QID  continue ice chips  8/7- Pt is now on D1 nectar thick liquids and close to thin liquids per SLP- after  MBSS today 8.  E coli bacteremia: Treated with 10 day course of antibiotics. Ceftriaxone -->Duricef thru 07/14. Thrombocytopenia/leucocytosis has resolved.              --Hematuria has resolved. No dysuria.   9. Chronic back/Right knee pain: SEE #3 ABOVE; Continue voltaren  gel qid to knee and back till family bring in the biofreeze (which works better) --continue Lidocaine  patches at nights to help with back spasms. Decrease flexeril  to 2.5 mg every 8 hours prn-- now d/c'd as below             --schedule tylenol  500 mg qid as LFTs resolved.   10. Abnormal LFTs: Have resolved and Lipitor 10mg  daily added on 07/19. Continue to  monitor weekly for  now.   8/5- will check CMP q Monday and BMP q Thursday- will check in AM since haven't rechecked in 1 week  8/6- LFTs have normalized 11. COPD/OSA: Has CPAP but not using PTA. Continue Yupelri  nebs daily (in place of Spiriva).  --continue albuterol  every night PTA.  .  12. Hypomagnesemia: Magnesium  reviewed and has normalized  13. Anemia: Monitor for signs of bleeding. H/H recovering from 13-->9.9-->11.2  8/6- Last Hb 10.7- down from 11.3- will recheck Thursday as ordered  8/7- Hb back up to 11.3 14.  Bradycardia: HR note to be in 51 to 61 range and had boderline prolonged Qtc on admission. Likely due to stroke.  Repeat EKG reviewed and is stable  15.  H/o MDD/anxiety: continue Lexapro  40mg  daily. Wellbutrin  started 75mg  BID  16. Bowel management: Last BM 8/1, continue to monitor, d/c suppository  8/5- Will give Sorbitol  15cc- and add Senna 1 tab daily- to help her go regularly- since on Tramadol  prn  8/6- no BM yet- will give Sorbitol  30cc and if no BM, will need Fleets enema  8/7- had BM after sorbitol - per pt- but was documented at 9:30 yesterday AM- not sure if needed sorbitol ? 17. R jaw/tooth pain: Continue Augmentin  BID x5 days completed to treat any potential dental infection; needs to f/up outpatient with a dentist eventually.Benzocaine  ordered -CT maxillofacial neg on 7/31 8/6- did myofascial release- pt has good results.  8/7- pt reports it's so much better- unless has to do SLP exercises- d/w SLP to see if can change up some of these exercises- see what's causing the pain? 18. Dry mouth: d/c flexeril     19. Hematoma on L lateral periorbital area, well circumscribed  8/3- cannot identify any trauma   8/5- Is spreading due to gravity/when sits up- educated daughter this is normal- she was a little concerned.  20.  Anemia- stable , plt borderline, cont to monitor   8/7- Hb up to 11.5 today!    Latest Ref Rng & Units 07/24/2024    4:51 AM 07/21/2024    5:17  AM 07/17/2024    5:02 AM  CBC  WBC 4.0 - 10.5 K/uL  8.0  6.0  5.8   Hemoglobin 12.0 - 15.0 g/dL 88.4  89.2  88.6   Hematocrit 36.0 - 46.0 % 34.6  33.3  33.4   Platelets 150 - 400 K/uL 156  147  153    Patient here for trigger point injections for  Consent done and on chart.  Cleaned areas with alcohol  and injected using a 27 gauge 1.5 inch needle  Injected 3cc- none wasted Using 1% Lidocaine  with no EPI  Upper traps- B/L  Levators- B/L  Posterior scalenes- B/L  Middle scalenes Splenius Capitus Pectoralis Major- B/L  Rhomboids Infraspinatus Teres Major/minor Thoracic paraspinals Lumbar paraspinals Other injections-  Dry needled SCM upper 1/3 Also injected L masseter  Patient's level of pain prior was 10-15/10 Current level of pain after injections is ~2/10- esp after myofascial release done in middle scalenes as well on other spot on scalenes- extremely painful but really helpful  There was no bleeding or complications.  Patient was advised to drink a lot of water  on day after injections to flush system Will have increased soreness for 12-48 hours after injections.  Can use Lidocaine  patches the day AFTER injections Can use theracane on day of injections in places didn't inject Can use heating pad 4-6 hours AFTER injections   I spent a total of 58   minutes on total care today- >50% coordination of care- due to  D/w SLP x2 about myofascial/jaw pain- set off by SLP HEP- also reviewed labs, vitals and d/w nurse  LOS: 16 days A FACE TO FACE EVALUATION WAS PERFORMED  Colman Birdwell 07/24/2024, 10:19 AM

## 2024-07-24 NOTE — Progress Notes (Signed)
 Occupational Therapy Weekly Progress Note  Patient Details  Name: [name] MRN: [id number] Date of Birth: February 03, 1932  Beginning of progress report period: July 16, 2024 End of progress report period: July 24, 2024  Today's Date: 07/24/2024 OT Individual Time: 8954-8879 OT Individual Time Calculation (min): 35 min    Patient has met 1 of 3 short term goals.  Patient is profoundly impacted by perceptual deficits therefore progress is limited.    Patient continues to demonstrate the following deficits: muscle weakness, impaired timing and sequencing, abnormal tone, unbalanced muscle activation, decreased coordination, and decreased motor planning, decreased visual perceptual skills and decreased visual motor skills, decreased attention to left, decreased initiation, decreased attention, decreased awareness, decreased problem solving, decreased safety awareness, decreased memory, and delayed processing, and decreased sitting balance, decreased standing balance, decreased postural control, hemiplegia, and decreased balance strategies and therefore will continue to benefit from skilled OT intervention to enhance overall performance with BADL and Reduce care partner burden.  Patient not progressing toward long term goals.  See goal revision..  Plan of care revisions: downgraded goals to max assist.  OT Short Term Goals Week 2:  OT Short Term Goal 1 (Week 2): Patient will actively lean forward in midline while sitting to prepare for sit to stand OT Short Term Goal 1 - Progress (Week 2): Not met OT Short Term Goal 2 (Week 2): Patient will locate left arm on body in functional context - during bathing and dressing OT Short Term Goal 2 - Progress (Week 2): Met OT Short Term Goal 3 (Week 2): Patient will sit at edge of bed for 2-3 seconds using compensatory strategies and without external support. OT Short Term Goal 3 - Progress (Week 2): Progressing toward goal Week 3:  OT Short Term Goal 1  (Week 3): Patient will sit at edge of bed for 2-3 seconds using compensatory strategies and without external support. OT Short Term Goal 2 (Week 3): Patient will actively lean forward in midline while sitting to prepare for sit to stand OT Short Term Goal 3 (Week 3): STG=LTG due to LOS  Skilled Therapeutic Interventions/Progress Updates:   Patient received supine in bed, still in pajamas.  Nursing providing care.  Patient holding left face, reporting significant facial pain.  Patient with active spasm in facial musculature.  Patient assisted to roll left and right to change into clean and dry brief.  Patient did not report increase pain with rolling, however declined getting dressed and up to wheelchair as she wanted facial pain to settle after medication given.  Ice pack applied to left.  Patient allowed to stay in bed, daughter at bedside.    Therapy Documentation Precautions:  Precautions Precautions: Fall Recall of Precautions/Restrictions: Impaired Precaution/Restrictions Comments: Cortrak- NPO; dense L Hemi Restrictions Weight Bearing Restrictions Per Provider Order: No General: General OT Amount of Missed Time: 10 Minutes   Pain:  Patient unable to fully participate in therapy today due to severe muscle spasm left side of face/ neck.  MD aware and will return to complete trigger point injections.     Therapy/Group: Individual Therapy  Joeanne Robicheaux M 07/24/2024, 11:58 AM

## 2024-07-24 NOTE — Progress Notes (Signed)
 Patient ID: [name], female   DOB: 1932/11/24, 89 y.o.   MRN: [id number] Made referral to [employer] for hospital bed, hoyer lift and tube feedings-[employer]. They will reach out to [name]-daughter to discuss co-pay and difference in cost with fully electric bed versus semi electric bed.

## 2024-07-24 NOTE — Progress Notes (Signed)
 Physical Therapy Session Note  Patient Details  Name: [name] MRN: [id number] Date of Birth: 01-Jan-1932  Today's Date: 07/24/2024 PT Individual Time: 1400-1515 PT Individual Time Calculation (min): 75 min   Short Term Goals: Week 2:  PT Short Term Goal 1 (Week 2): Pt will complete bed mobility with maxA of 1 person PT Short Term Goal 2 (Week 2): Pt will complete bed<>chair transfers consistently at maxA level PT Short Term Goal 3 (Week 2): Pt will sit unsupported at EOB for 1 minute with modA  Skilled Therapeutic Interventions/Progress Updates:      Pt presents in bed, on bed pan with her daughter at the bedside. Patient assisted off the bed pan (no output). Provided a new clean brief which was donned with totalA. Patient able to roll to her L with minA with bed features. She requires totalA for rolling to her R side. Donned clean pants and shirt at bed level with totalA. Patient assisted to EOB with maxA and then transferred into the wheelchair with maxA squat pivot transfer. Taken to ortho gym to introduce car transfer. She required totalA for completing the squat pivot transfer into the car and totalA for repositioning safely in the car. Similar assist for exiting the car. Presents with L lean in the car and difficulty getting her hips positioned under her shoulders. Patient taken to Dance Group to work on individual therapeutic activity at seated level. Patient complaining of music being too loud and hurting her jaw. She was returned to the room and maxA stand pivot transfer into the recliner, which she prefers. Pillows provided for support. BLE elevated. Call bell in lap.   Therapy Documentation Precautions:  Precautions Precautions: Fall Recall of Precautions/Restrictions: Impaired Precaution/Restrictions Comments: Cortrak- NPO; dense L Hemi Restrictions Weight Bearing Restrictions Per Provider Order: No General:      Therapy/Group: Individual Therapy  [name] 07/24/2024, 7:41 AM

## 2024-07-24 NOTE — Procedures (Signed)
 Modified Barium Swallow Study  Patient Details  Name: [name] MRN: [id number] Date of Birth: 1932/05/22  Today's Date: 07/24/2024  Modified Barium Swallow completed.  Full report located under Chart Review in the Imaging Section.  History of Present Illness [name] is an 89 yo female presenting to ED 7/5 for RUQ pain, nausea/vomiting. Found to have suspected cholecystitis vs passed choledocholithiasis and acute liver injury, hyponatremia, bacteriuria, and hematuria. Initially seen at [facility] ED 7/3 with n/v and elevated LFTs but left without being seen. Daughter noted sudden onset AMS and L sided weakness 7/9 with CTA showing abrupt occlusion of distal R MCA M1 s/p TNK. Taken to IR with unsuccessful revascularization (repeated reocclusion), post-procedure CTH showed small SAH. Remained intubated after the procedure, 7/9-7/10. PMH includes HLD, PAD, COPD with emphysema, OSA, R breast cancer, back pain, esophageal spasm  Clinical Impression Pt exhibits markedly improved oropharyngeal dysphagia compared to previous MBS with aspiration only observed with thin liquids due to mild timing deficits of pharyngeal structure movement. With thin liquids from cup, patient took very small sips (often smaller than teaspoon), however when prompted to take a larger sip exhibited transient aspiration where material moved down the posterior wall of the laryngeal vestibule and below the vocal folds, then back out. However, residue remained in the vestibule and was aspirated audibly during oral preparation of solid.  Despite attempts to clear material from the trachea, cough was unsuccessful. No significant penetration/aspiration observed with all other trials and no significant residue observed across consistencies other than noted. Patient grabbed jaw during chewing of Dys3 solids, and given acute jaw pain present since yesterday, recommend initiation of Dys1/NTL diet to limit chewing. As patient's jaw pain  improves, will be able to upgrade diet. Administer medications whole in puree. SLP will continue to follow to target increased cough strength and manage diet upgrade as able. Factors that may increase risk of adverse event in presence of aspiration [name] & [name] 2021): Reduced cognitive function;Limited mobility;Frail or deconditioned  Swallow Evaluation Recommendations Recommendations: PO diet PO Diet Recommendation: Dysphagia 1 (Pureed);Mildly thick liquids (Level 2, nectar thick) Liquid Administration via: Cup;Spoon;Straw Medication Administration: Whole meds with puree Supervision: Full supervision/cueing for swallowing strategies Swallowing strategies  : Minimize environmental distractions;Slow rate;Small bites/sips Postural changes: Position pt fully upright for meals Oral care recommendations: Oral care BID (2x/day) Caregiver Recommendations: Avoid jello, ice cream, thin soups, popsicles;Remove water  pitcher  [name], M.A., CCC-SLP  [name] 07/24/2024,9:50 AM

## 2024-07-24 NOTE — Progress Notes (Signed)
 Speech Language Pathology Weekly Progress and Session Note  Patient Details  Name: [name] MRN: [id number] Date of Birth: [birth year]  Beginning of progress report period: July 16, 2024 End of progress report period: July 24, 2024  Short Term Goals: Week 2: SLP Short Term Goal 1 (Week 2): Pt will consume ice chips and teaspoons of thin liquid demonstrating reduction in frequency of overt s/sx of penetration/aspiration by at least 25% to indicate appropriateness for repeated objective swallow study. SLP Short Term Goal 1 - Progress (Week 2): Met SLP Short Term Goal 2 (Week 2): Patient will recall daily events with 90% accuracy using internal/external memory aids given min assist. SLP Short Term Goal 2 - Progress (Week 2): Met SLP Short Term Goal 3 (Week 2): Patient will complete pharyngeal strengthening exercises as appropriate given min A in order to improve swallow function SLP Short Term Goal 3 - Progress (Week 2): Met   New Short Term Goals: Week 3: SLP Short Term Goal 1 (Week 3): STGs=LTGs d/t ELOS  Weekly Progress Updates: Patient has made steady progress towards therapy goals, meeting 3/3 short term goals set for this reporting period. Patient underwent repeated MBS 8/7 with recommendations to initiate oral diet of Dys1/NTLs. Patient currently benefits from min assist for recall of daily events and for completion of pharyngeal strengthening exercises. Patient and family education ongoing. Patient will continue to benefit from skilled therapy services during remainder of CIR stay.     Intensity: Minumum of 1-2 x/day, 30 to 90 minutes Frequency: 3 to 5 out of 7 days Duration/Length of Stay: 8/13 Treatment/Interventions: Cognitive remediation/compensation;Functional tasks;Therapeutic Activities;Internal/external aids;Patient/family education;Dysphagia/aspiration precaution training;Therapeutic Exercise;Speech/Language facilitation  Levin Dagostino A Kinsley Nicklaus 07/24/2024, 12:02 PM

## 2024-07-24 NOTE — Plan of Care (Signed)
 DC ambulation, stairs, and car transfer goals given slower than anticipated progress  Problem: RH Car Transfers Goal: LTG Patient will perform car transfers with assist (PT) Description: LTG: Patient will perform car transfers with assistance (PT). Outcome: Not Met (add Reason)   Problem: RH Ambulation Goal: LTG Patient will ambulate in controlled environment (PT) Description: LTG: Patient will ambulate in a controlled environment, # of feet with assistance (PT). Outcome: Not Met (add Reason) Goal: LTG Patient will ambulate in home environment (PT) Description: LTG: Patient will ambulate in home environment, # of feet with assistance (PT). Outcome: Not Met (add Reason)   Problem: RH Wheelchair Mobility Goal: LTG Patient will propel w/c in controlled environment (PT) Description: LTG: Patient will propel wheelchair in controlled environment, # of feet with assist (PT) Outcome: Not Met (add Reason) Goal: LTG Patient will propel w/c in home environment (PT) Description: LTG: Patient will propel wheelchair in home environment, # of feet with assistance (PT). Outcome: Not Met (add Reason) Goal: LTG Patient will propel w/c in community environment (PT) Description: LTG: Patient will propel wheelchair in community environment, # of feet with assist (PT) Outcome: Not Met (add Reason)   Problem: RH Stairs Goal: LTG Patient will ambulate up and down stairs w/assist (PT) Description: LTG: Patient will ambulate up and down # of stairs with assistance (PT) Outcome: Not Met (add Reason)

## 2024-07-25 LAB — URINALYSIS, ROUTINE W REFLEX MICROSCOPIC
Bilirubin Urine: NEGATIVE
Glucose, UA: NEGATIVE mg/dL
Hgb urine dipstick: NEGATIVE
Ketones, ur: NEGATIVE mg/dL
Leukocytes,Ua: NEGATIVE
Nitrite: NEGATIVE
Protein, ur: NEGATIVE mg/dL
Specific Gravity, Urine: 1.015 (ref 1.005–1.030)
pH: 7 (ref 5.0–8.0)

## 2024-07-25 MED ORDER — FREE WATER
300.0000 mL | Freq: Once | Status: AC
  Administered 2024-07-25: 300 mL

## 2024-07-25 NOTE — Progress Notes (Signed)
 PROGRESS NOTE   Subjective/Complaints:  Sleepy this AM- was awake on and off all night-  due to horrible jaw pain.   Got a lot of pain meds'- But also confused  Pain 0/10- but hasn't made moving yet this AM-  -  ROS:   Pt denies SOB, abd pain, CP, N/V/C/D, and vision changes Jaw pain was bad overnight (+)   Per HPI Objective:   DG Swallowing Func-Speech Pathology Result Date: 07/24/2024 Table formatting from the original result was not included. Modified Barium Swallow Study Patient Details Name: NOGA FOGG MRN: [id number] Date of Birth: 1932-08-03 Today's Date: 07/24/2024 HPI/PMH: HPI: ELMYRA BANWART is an 89 yo female presenting to ED 7/5 for RUQ pain, nausea/vomiting. Found to have suspected cholecystitis vs passed choledocholithiasis and acute liver injury, hyponatremia, bacteriuria, and hematuria. Initially seen at [facility] ED 7/3 with n/v and elevated LFTs but left without being seen. Daughter noted sudden onset AMS and L sided weakness 7/9 with CTA showing abrupt occlusion of distal R MCA M1 s/p TNK. Taken to IR with unsuccessful revascularization (repeated reocclusion), post-procedure CTH showed small SAH. Remained intubated after the procedure, 7/9-7/10. PMH includes HLD, PAD, COPD with emphysema, OSA, R breast cancer, back pain, esophageal spasm Clinical Impression: Clinical Impression: Pt exhibits markedly improved oropharyngeal dysphagia compared to previous MBS with aspiration only observed with thin liquids due to mild timing deficits of pharyngeal structure movement. With thin liquids from cup, patient took very small sips (often smaller than teaspoon), however when prompted to take a larger sip exhibited transient aspiration where material moved down the posterior wall of the laryngeal vestibule and below the vocal folds, then back out. However, residue remained in the vestibule and was aspirated audibly during oral preparation  of solid.  Despite attempts to clear material from the trachea, cough was unsuccessful. No significant penetration/aspiration observed with all other trials and no significant residue observed across consistencies other than noted. Patient grabbed jaw during chewing of Dys3 solids, and given acute jaw pain present since yesterday, recommend initiation of Dys1/NTL diet to limit chewing. As patient's jaw pain improves, will be able to upgrade diet. Administer medications whole in puree. SLP will continue to follow to target increased cough strength and manage diet upgrade as able. Factors that may increase risk of adverse event in presence of aspiration [name] & [name] 2021): Factors that may increase risk of adverse event in presence of aspiration [name] & [name] 2021): Reduced cognitive function; Limited mobility; Frail or deconditioned Recommendations/Plan: Swallowing Evaluation Recommendations Swallowing Evaluation Recommendations Recommendations: PO diet PO Diet Recommendation: Dysphagia 1 (Pureed); Mildly thick liquids (Level 2, nectar thick) Liquid Administration via: Cup; Spoon; Straw Medication Administration: Whole meds with puree Supervision: Full supervision/cueing for swallowing strategies Swallowing strategies  : Minimize environmental distractions; Slow rate; Small bites/sips Postural changes: Position pt fully upright for meals Oral care recommendations: Oral care BID (2x/day) Caregiver Recommendations: Avoid jello, ice cream, thin soups, popsicles; Remove water  pitcher Treatment Plan Treatment Plan Treatment recommendations: Therapy as outlined in treatment plan below Follow-up recommendations: Home health SLP Functional status assessment: Patient has had a recent decline in their functional status and demonstrates the ability to make  significant improvements in function in a reasonable and predictable amount of time. Treatment frequency: Min 2x/week Treatment duration: 2 weeks Interventions:  Aspiration precaution training; Oropharyngeal exercises; Compensatory techniques; Patient/family education; Trials of upgraded texture/liquids Recommendations Recommendations for follow up therapy are one component of a multi-disciplinary discharge planning process, led by the attending physician.  Recommendations may be updated based on patient status, additional functional criteria and insurance authorization. Assessment: Orofacial Exam: Orofacial Exam Oral Cavity: Oral Hygiene: WFL Oral Cavity - Dentition: Dentures, not available; Poor condition; Missing dentition Orofacial Anatomy: WFL Oral Motor/Sensory Function: Suspected cranial nerve impairment CN V - Trigeminal: WFL CN VII - Facial: Left motor impairment CN IX - Glossopharyngeal, CN X - Vagus: Left motor impairment CN XII - Hypoglossal: Left motor impairment Anatomy: Anatomy: Suspected cervical osteophytes; Prominent cricopharyngeus Boluses Administered: Boluses Administered Boluses Administered: Thin liquids (Level 0); Mildly thick liquids (Level 2, nectar thick); Moderately thick liquids (Level 3, honey thick); Puree; Solid  Oral Impairment Domain: Oral Impairment Domain Lip Closure: No labial escape Tongue control during bolus hold: Cohesive bolus between tongue to palatal seal Bolus preparation/mastication: Slow prolonged chewing/mashing with complete recollection Bolus transport/lingual motion: Slow tongue motion Oral residue: Trace residue lining oral structures Location of oral residue : Tongue; Palate Initiation of pharyngeal swallow : Pyriform sinuses  Pharyngeal Impairment Domain: Pharyngeal Impairment Domain Soft palate elevation: No bolus between soft palate (SP)/pharyngeal wall (PW) Laryngeal elevation: Complete superior movement of thyroid  cartilage with complete approximation of arytenoids to epiglottic petiole Anterior hyoid excursion: Complete anterior movement Epiglottic movement: Complete inversion Laryngeal vestibule closure: Complete,  no air/contrast in laryngeal vestibule Pharyngeal stripping wave : Present - complete Pharyngeal contraction (A/P view only): N/A Pharyngoesophageal segment opening: Complete distension and complete duration, no obstruction of flow Tongue base retraction: Trace column of contrast or air between tongue base and PPW Pharyngeal residue: Trace residue within or on pharyngeal structures Location of pharyngeal residue: Valleculae; Pyriform sinuses  Esophageal Impairment Domain: No data recorded Pill: No data recorded Penetration/Aspiration Scale Score: Penetration/Aspiration Scale Score 1.  Material does not enter airway: Mildly thick liquids (Level 2, nectar thick); Moderately thick liquids (Level 3, honey thick); Puree; Solid 7.  Material enters airway, passes BELOW cords and not ejected out despite cough attempt by patient: Thin liquids (Level 0) Compensatory Strategies: Compensatory Strategies Compensatory strategies: No   General Information: Caregiver present: No  Diet Prior to this Study: NPO; G-tube   Temperature : Normal   Respiratory Status: WFL   Supplemental O2: None (Room air)   History of Recent Intubation: Yes  Behavior/Cognition: Alert; Cooperative; Requires cueing Self-Feeding Abilities: Needs assist with self-feeding Baseline vocal quality/speech: Normal Volitional Cough: Able to elicit Volitional Swallow: Able to elicit Exam Limitations: No limitations Goal Planning: Prognosis for improved oropharyngeal function: Good Barriers to Reach Goals: Cognitive deficits No data recorded Patient/Family Stated Goal: continued improvement Consulted and agree with results and recommendations: Patient Pain: Pain Assessment Pain Assessment: Faces Faces Pain Scale: 0 End of Session: Start Time:No data recorded Stop Time: No data recorded Time Calculation:No data recorded Charges: No data recorded SLP visit diagnosis: SLP Visit Diagnosis: Dysphagia, oropharyngeal phase (R13.12) Past Medical History: Past Medical  History: Diagnosis Date  Anxiety   Back pain   Cancer (HCC)   Depression   Emphysema   Esophageal spasm   Lung nodule   OSA (obstructive sleep apnea)   Osteomalacia   Personal history of radiation therapy   Vertigo   Vitamin D  deficiency  Past Surgical History:  Past Surgical History: Procedure Laterality Date  ABDOMINAL HYSTERECTOMY    APPENDECTOMY    BREAST BIOPSY Right 08/31/2020  BREAST EXCISIONAL BIOPSY Left 1978  BREAST LUMPECTOMY Right 09/30/2020  BREAST LUMPECTOMY WITH RADIOACTIVE SEED LOCALIZATION Right 09/30/2020  Procedure: RIGHT BREAST LUMPECTOMY WITH RADIOACTIVE SEED LOCALIZATION;  Surgeon: Curvin Deward MOULD, MD;  Location: [facility];  Service: General;  Laterality: Right;  CARPAL TUNNEL RELEASE    IR CT HEAD LTD  06/25/2024  IR GASTROSTOMY TUBE MOD SED  07/14/2024  IR PERCUTANEOUS ART THROMBECTOMY/INFUSION INTRACRANIAL INC DIAG ANGIO  06/25/2024  RADIOLOGY WITH ANESTHESIA N/A 06/25/2024  Procedure: RADIOLOGY WITH ANESTHESIA;  Surgeon: Dolphus Carrion, MD;  Location: [facility] OR;  Service: Radiology;  Laterality: N/A;  ROTATOR CUFF REPAIR   Rosina DELENA Downy 07/24/2024, 9:53 AM     Recent Labs    07/24/24 0451  WBC 8.0  HGB 11.5*  HCT 34.6*  PLT 156     Recent Labs    07/23/24 0508 07/24/24 0451  NA 137 137  K 3.8 4.2  CL 102 100  CO2 25 28  GLUCOSE 111* 95  BUN 16 16  CREATININE 0.59 0.62  CALCIUM  9.2 9.2      Intake/Output Summary (Last 24 hours) at 07/25/2024 0824 Last data filed at 07/24/2024 2126 Gross per 24 hour  Intake 470 ml  Output --  Net 470 ml        Physical Exam: Vital Signs Blood pressure (!) 171/71, pulse 73, temperature 98.1 F (36.7 C), temperature source Oral, resp. rate 18, height 5' 1 (1.549 m), weight 66.3 kg, SpO2 98%.      General: sleepy- hard to wake up this AM; daughter at bedside;  NAD HENT: conjugate gaze; oropharynx little dry CV: regular rate and rhythm; no JVD Pulmonary: CTA B/L; no W/R/R- good air movement GI: soft, NT,  ND, (+)BS Psychiatric: appropriate Neurological: overall alert- sleepy, but more alert MSK- L jaw pain is less tight- also neck tightness is also less in upper traps, levators, SCM's,  scalenes Skin: multiple bruises throughout skin esp on abd c/w lovenox  injections.        Comments:  RUE 5-/5 LUE 0/5 throughout RLE- 4+/5 throughout- hard to get pt to do exam LLE- PF 1/5- otherwise 0/5 in LLE, stable 8/1 Skin:    General: Skin is warm and dry.   Neurological:     Mental Status: She is alert.     Comments: Mild dysarthria with occasional wet voice. Left facial weakness with left inattention. Left HH and needs cues to look to left. A. She was able to answer basic questions, name and place, but thought it was SUnday , not Monday   Left hemiparesis with sensory deficits.  No clonus, no hoffman's  Flaccid tone on L side  Psychiatric:     Comments: Alert and smiling   Assessment/Plan: 1. Functional deficits which require 3+ hours per day of interdisciplinary therapy in a comprehensive inpatient rehab setting. Physiatrist is providing close team supervision and 24 hour management of active medical problems listed below. Physiatrist and rehab team continue to assess barriers to discharge/monitor patient progress toward functional and medical goals  Care Tool:  Bathing    Body parts bathed by patient: Face, Chest, Abdomen   Body parts bathed by helper: Right arm, Left arm, Front perineal area, Buttocks, Right upper leg, Left upper leg, Right lower leg, Left lower leg     Bathing assist Assist Level: Maximal Assistance - Patient 24 -  49%     Upper Body Dressing/Undressing Upper body dressing   What is the patient wearing?: Pull over shirt    Upper body assist Assist Level: Moderate Assistance - Patient 50 - 74%    Lower Body Dressing/Undressing Lower body dressing      What is the patient wearing?: Pants, Incontinence brief     Lower body assist Assist for lower body  dressing: Total Assistance - Patient < 25%     Toileting Toileting    Toileting assist Assist for toileting: 2 Helpers     Transfers Chair/bed transfer  Transfers assist  Chair/bed transfer activity did not occur: Safety/medical concerns (did not get up)  Chair/bed transfer assist level: Maximal Assistance - Patient 25 - 49%     Locomotion Ambulation   Ambulation assist   Ambulation activity did not occur: Safety/medical concerns          Walk 10 feet activity   Assist  Walk 10 feet activity did not occur: Safety/medical concerns        Walk 50 feet activity   Assist Walk 50 feet with 2 turns activity did not occur: Safety/medical concerns         Walk 150 feet activity   Assist Walk 150 feet activity did not occur: Safety/medical concerns         Walk 10 feet on uneven surface  activity   Assist Walk 10 feet on uneven surfaces activity did not occur: Safety/medical concerns         Wheelchair     Assist Is the patient using a wheelchair?: Yes Type of Wheelchair: Manual    Wheelchair assist level: Dependent - Patient 0%      Wheelchair 50 feet with 2 turns activity    Assist        Assist Level: Dependent - Patient 0%   Wheelchair 150 feet activity     Assist      Assist Level: Dependent - Patient 0%   Blood pressure (!) 171/71, pulse 73, temperature 98.1 F (36.7 C), temperature source Oral, resp. rate 18, height 5' 1 (1.549 m), weight 66.3 kg, SpO2 98%.  Medical Problem List and Plan: 1. Functional deficits secondary to R MCA stroke             -patient may  shower             -ELOS/Goals: 16-20 days- min-mod A           Con't CIR PT, OT and SLP  Team conference Wednesday =will determine length of stay  D3 started, increase to 2,000U, metanx also ordered Discussed with therapy initiating Hoyer lift training as patient wants to go home  Con't CIR PT, OT and SLP   2.  Impaired  mobility: -DVT/anticoagulation:  Pharmaceutical: continue Lovenox  40mg  daily             -antiplatelet therapy: Continue ASA.  --Plavix  recommended if no further procedures planned. PEG placed without complications  3. Muscle spasms in back: ccontinue lidocaine  patches. Tylenol  500mg  TID/HS, Baclofen  5mg  added. Voltaren  gel.   --sedated from Flexeril  5 mg/tramadol  50 mg. Will decrease doses. Decrease prn Tramadol  to 50mg  q8H prn, d/c flexeril   Continue kpad  -d/c'd flexeril   8/3- Took lidoderm  patches off form yesterday  8/5- Denies pain this AM  8/6- L jaw pain- did myofascial release- said was much better and could open mouth more after done. 8/7- doing better today per pt 8/8- L jaw pain- worse  overnight- but better this AM  4. Hospital induced delirium at night: daughter may stay over at night -Conitnue melatonin 5mg  at 8 pm tonight. Sleep wake chart to monitor sleep              -antipsychotic agents: N/A  8/7- much more able to tell me what's going on  8/8- more confused last night- will check U/A  and Cx 5. Neuropsych/cognition: This patient may be intermittently capable of making decisions on her own behalf.  6. Daytime somnolence: modafinil  100mg  daily ordered  7. Dysphagia/dry mouth: NPO on PEG feeds 7/29, transitioned to bolus feeds, Osmolite bolus QID  continue ice chips  8/7- Pt is now on D1 nectar thick liquids and close to thin liquids per SLP- after  MBSS today  8/8- will give 300 ml bolus x1 to help flush lactic acid so trp injections work well.  8.  E coli bacteremia: Treated with 10 day course of antibiotics. Ceftriaxone -->Duricef thru 07/14. Thrombocytopenia/leucocytosis has resolved.              --Hematuria has resolved. No dysuria.   9. Chronic back/Right knee pain: SEE #3 ABOVE; Continue voltaren  gel qid to knee and back till family bring in the biofreeze (which works better) --continue Lidocaine  patches at nights to help with back spasms. Decrease flexeril  to  2.5 mg every 8 hours prn-- now d/c'd as below             --schedule tylenol  500 mg qid as LFTs resolved.   10. Abnormal LFTs: Have resolved and Lipitor 10mg  daily added on 07/19. Continue to monitor weekly for  now.   8/5- will check CMP q Monday and BMP q Thursday- will check in AM since haven't rechecked in 1 week  8/6- LFTs have normalized 11. COPD/OSA: Has CPAP but not using PTA. Continue Yupelri  nebs daily (in place of Spiriva).  --continue albuterol  every night PTA.  .  12. Hypomagnesemia: Magnesium  reviewed and has normalized  13. Anemia: Monitor for signs of bleeding. H/H recovering from 13-->9.9-->11.2  8/6- Last Hb 10.7- down from 11.3- will recheck Thursday as ordered  8/7- Hb back up to 11.3 14.  Bradycardia: HR note to be in 51 to 61 range and had boderline prolonged Qtc on admission. Likely due to stroke.  Repeat EKG reviewed and is stable  15.  H/o MDD/anxiety: continue Lexapro  40mg  daily. Wellbutrin  started 75mg  BID  16. Bowel management: Last BM 8/1, continue to monitor, d/c suppository  8/5- Will give Sorbitol  15cc- and add Senna 1 tab daily- to help her go regularly- since on Tramadol  prn  8/6- no BM yet- will give Sorbitol  30cc and if no BM, will need Fleets enema  8/7- had BM after sorbitol - per pt- but was documented at 9:30 yesterday AM- not sure if needed sorbitol ? 17. R jaw/tooth pain: Continue Augmentin  BID x5 days completed to treat any potential dental infection; needs to f/up outpatient with a dentist eventually.Benzocaine  ordered -CT maxillofacial neg on 7/31 8/6- did myofascial release- pt has good results.  8/7- pt reports it's so much better- unless has to do SLP exercises- d/w SLP to see if can change up some of these exercises- see what's causing the pain? 18. Dry mouth: d/c flexeril     19. Hematoma on L lateral periorbital area, well circumscribed  8/3- cannot identify any trauma   8/5- Is spreading due to gravity/when sits up- educated daughter  this is normal- she was a little concerned.  20.  Anemia- stable , plt borderline, cont to monitor   8/7- Hb up to 11.5 today!    Latest Ref Rng & Units 07/24/2024    4:51 AM 07/21/2024    5:17 AM 07/17/2024    5:02 AM  CBC  WBC 4.0 - 10.5 K/uL 8.0  6.0  5.8   Hemoglobin 12.0 - 15.0 g/dL 88.4  89.2  88.6   Hematocrit 36.0 - 46.0 % 34.6  33.3  33.4   Platelets 150 - 400 K/uL 156  147  153    Trigger point injections done 07/24/24 Upper traps- B/L  Levators- B/L  Posterior scalenes- B/L  Middle scalenes Splenius Capitus Pectoralis Major- B/L  Rhomboids Infraspinatus Teres Major/minor Thoracic paraspinals Lumbar paraspinals Other injections-  Dry needled SCM upper 1/3 Also injected L masseter  21. Will get U/A and Cx- due to more confusion overnight.    I spent a total of 39   minutes on total care today- >50% coordination of care- due to  D/w daughter about confusion and trP injections and jaw pain.   LOS: 17 days A FACE TO FACE EVALUATION WAS PERFORMED  Sloan Takagi 07/25/2024, 8:24 AM

## 2024-07-25 NOTE — Progress Notes (Signed)
 Patient ID: [name], female   DOB: [birth year], 89 y.o.   MRN: [id number] Met with daughter who reports needs drop-arm bedside commode and have added to the order of rest of equipment. Asked if daughter wanted education set up to combine them she felt she will be here anyway. Aware wheelchair evaluation on Monday 9:00 am. Continue to work on discharge needs for pt to go home on Wednesday

## 2024-07-25 NOTE — Progress Notes (Signed)
 Occupational Therapy Session Note  Patient Details  Name: [name] MRN: [id number] Date of Birth: 1932-01-30  Today's Date: 07/25/2024 OT Individual Time: 1000-1100 OT Individual Time Calculation (min): 60 min    Short Term Goals: Week 3:  OT Short Term Goal 1 (Week 3): Patient will sit at edge of bed for 2-3 seconds using compensatory strategies and without external support. OT Short Term Goal 2 (Week 3): Patient will actively lean forward in midline while sitting to prepare for sit to stand OT Short Term Goal 3 (Week 3): STG=LTG due to LOS  Skilled Therapeutic Interventions/Progress Updates:    Patient received sleeping in bed - nursing providing care, daughter [name] at bedside.  Daughter indicating patient did not have good night - had some confusion and disrupted sleep.  Patient woke to gentle shake and calling her name.  Patient bright once awake.  Daughter indicates that she used hoyer lift to transfer patient to/from bed/wheelchair.  Discussed the benefit for time and energy saving - to bathe and dress patient at bed level, using rolling, bridging to aide with clothing adjustment.  Daughter shown how to doff and don shirt at bed level.  Discussed bed positioning for skin care - staying dry/ clean, and changing position to prevent all of her weight on her sacrum.  Discussion regarding toileting.  Patient prefers to toilet in seated position versus bedpan.  Patient and daughter wanting to try toileting in bathroom.  Reviewed set up of bathroom at home.  Discussed active transfer requirements versus hydraulic lift.  Transferred patient to toilet, and she had continent void.  Daughter assisted patient to stand to allow second caregiver to complete hygiene and clothing management.  Daughter assisted with squat pivot trasnfer to wheelchair after instruction.  Patient left up in wheelchair at sink to finish grooming with daughter present.    Therapy Documentation Precautions:   Precautions Precautions: Fall Recall of Precautions/Restrictions: Impaired Precaution/Restrictions Comments: Cortrak- NPO; dense L Hemi Restrictions Weight Bearing Restrictions Per Provider Order: No   Pain: Pain Assessment Pain Scale: 0-10 Pain Score: 2  Pain Location: face Provided ice pack    Therapy/Group: Individual Therapy  Rhemi Balbach M 07/25/2024, 12:52 PM

## 2024-07-25 NOTE — Progress Notes (Signed)
 Physical Therapy Session Note  Patient Details  Name: [name] MRN: [id number] Date of Birth: 07/07/1932  Today's Date: 07/25/2024 PT Individual Time: 0800-0900 + 1345-1415 PT Individual Time Calculation (min): 60 min  + 30 min  Short Term Goals: Week 3:  PT Short Term Goal 1 (Week 3): STG = LTG  Skilled Therapeutic Interventions/Progress Updates:      1st session: Patient sleeping in bed with her daughter, [name], at the bedside. [name] reports the patient had a poor nights rest due to jaw pain, MD aware of pain.   Focused session on family training to prepare for home. Reviewed recommendations of DME - hospital bed, custom wheelchair, and hoyer lift. Retrieved manual hoyer lift and sling. Educated [name] on precautions, usage, and how to safely operate lift.   Donned pants and shoes at bed level with totalA at bed level. Patient able to roll to her L with minA at bed level using bed rails. Requires maxA for rolling to her R. Donned hoyer sling while education [name] at technique and rolling to don - shelly completing hands on care and assisting with all aspects of bed level care.   [name] assisted patient to the wheelchair via [facility] lift with PT providing cues and assisting with managing patient while she was in the lift. [name] then practiced getting patient back to bed using the Lift as well. She did well but would benefit from further hands on practice while leading/directing care.   Educated shelly on pressure relief methods, bed positioning, and general home safety tips.   Patient requesting to use the bed pan at end of treatment - NT made aware of her request.     2nd session: Pt sitting in wheelchair with several family members present at the bedside. Pt does not appear to be in any pain, reports improvement in jaw pain from muscle spasm medication.  Focused session on NMR for sit<>stands, standing balance, midline awareness, and gait training.   In rehab hallway with  R hand rail, pt completed sit<>stands with mod/maxA with L knee blocked. Pt requires multi-modal cueing for midline awareness in standing, push's/leans L in standing without initiating correction. Able to progress to skilled ambulation using R hand rail with maxA for managing trunk support and LLE during gait cycle. L knee hyperextends vs buckles in stance phase, trunk rotated L, and trunk shortening on L. Ambulated a total distance of ~81ft + ~8ft with seated rest breaks.   Pt returned to her room at end of treatment and was left sitting up in the wheelchair with her needs met. Family at the bedside.    Therapy Documentation Precautions:  Precautions Precautions: Fall Recall of Precautions/Restrictions: Impaired Precaution/Restrictions Comments: Cortrak- NPO; dense L Hemi Restrictions Weight Bearing Restrictions Per Provider Order: No General:      Therapy/Group: Individual Therapy  [name] 07/25/2024, 7:50 AM

## 2024-07-26 LAB — URINE CULTURE

## 2024-07-26 LAB — GLUCOSE, CAPILLARY
Glucose-Capillary: 140 mg/dL — ABNORMAL HIGH (ref 70–99)
Glucose-Capillary: 160 mg/dL — ABNORMAL HIGH (ref 70–99)

## 2024-07-26 NOTE — Progress Notes (Signed)
 Occupational Therapy Session Note  Patient Details  Name: [name] MRN: [id number] Date of Birth: 12-29-1931  Today's Date: 07/26/2024 OT Individual Time: 9081-8984 OT Individual Time Calculation (min): 57 min    Short Term Goals: Week 3:  OT Short Term Goal 1 (Week 3): Patient will sit at edge of bed for 2-3 seconds using compensatory strategies and without external support. OT Short Term Goal 2 (Week 3): Patient will actively lean forward in midline while sitting to prepare for sit to stand OT Short Term Goal 3 (Week 3): STG=LTG due to LOS  Skilled Therapeutic Interventions/Progress Updates:    Patient agreeable to participate in OT session. Reports no pain level.   Patient participated in skilled OT session focusing on standing tolerance, UE NMR, balance and functional transfers. Patient completed transfers to R side max A. Standing static balance total A due to inability to maintain upright and L side lean. OT facilitated increased use of LLE to maintain upright balance. Patient completed static sitting activity with visual cue to increase ability to sit without fall. Patient able to self correct with verbal cueing. Patient total A to dep to return to bed via stand pivot transfer.    Therapy Documentation Precautions:  Precautions Precautions: Fall Recall of Precautions/Restrictions: Impaired Precaution/Restrictions Comments: Cortrak- NPO; dense L Hemi Restrictions Weight Bearing Restrictions Per Provider Order: No ]  Therapy/Group: Individual Therapy  D'mariea L Nakisha Chai 07/26/2024, 7:47 AM

## 2024-07-26 NOTE — Progress Notes (Signed)
 PROGRESS NOTE   Subjective/Complaints:  Pt doing well, slept well, pain well managed overall, LBM last night per daughter, urinating ok. No other complaints or concerns.    ROS:   Pt denies SOB, abd pain, CP, N/V/C/D, and vision changes Jaw pain -intermittent   Per HPI Objective:   No results found.     Recent Labs    07/24/24 0451  WBC 8.0  HGB 11.5*  HCT 34.6*  PLT 156     Recent Labs    07/24/24 0451  NA 137  K 4.2  CL 100  CO2 28  GLUCOSE 95  BUN 16  CREATININE 0.62  CALCIUM  9.2      Intake/Output Summary (Last 24 hours) at 07/26/2024 1044 Last data filed at 07/26/2024 0942 Gross per 24 hour  Intake 477 ml  Output --  Net 477 ml        Physical Exam: Vital Signs Blood pressure (!) 155/65, pulse 68, temperature (!) 97.5 F (36.4 C), temperature source Oral, resp. rate 18, height 5' 1 (1.549 m), weight 66.8 kg, SpO2 96%.      General: awake and alert; daughter at bedside;  NAD HENT: conjugate gaze; oropharynx little dry, small bruise to L periorbital area CV: regular rate and rhythm; no JVD Pulmonary: CTA B/L; no W/R/R- good air movement GI: soft, NT, ND, (+)BS Psychiatric: appropriate Neurological: alert, L inattention Skin: multiple bruises throughout skin esp on abd c/w lovenox  injections.   PRIOR EXAMS: MSK- L jaw pain is less tight- also neck tightness is also less in upper traps, levators, SCM's,  scalenes    Comments:  RUE 5-/5 LUE 0/5 throughout RLE- 4+/5 throughout- hard to get pt to do exam LLE- PF 1/5- otherwise 0/5 in LLE, stable 8/1  Neurological:     Mental Status: She is alert.     Comments: Mild dysarthria with occasional wet voice. Left facial weakness with left inattention. Left HH and needs cues to look to left. A. She was able to answer basic questions, name and place, but thought it was SUnday , not Monday   Left hemiparesis with sensory deficits.  No  clonus, no hoffman's  Flaccid tone on L side  Psychiatric:     Comments: Alert and smiling   Assessment/Plan: 1. Functional deficits which require 3+ hours per day of interdisciplinary therapy in a comprehensive inpatient rehab setting. Physiatrist is providing close team supervision and 24 hour management of active medical problems listed below. Physiatrist and rehab team continue to assess barriers to discharge/monitor patient progress toward functional and medical goals  Care Tool:  Bathing    Body parts bathed by patient: Face   Body parts bathed by helper: Right arm, Left arm, Front perineal area, Buttocks, Right upper leg, Left upper leg, Right lower leg, Left lower leg, Chest, Abdomen     Bathing assist Assist Level: Total Assistance - Patient < 25%     Upper Body Dressing/Undressing Upper body dressing   What is the patient wearing?: Pull over shirt    Upper body assist Assist Level: Maximal Assistance - Patient 25 - 49%    Lower Body Dressing/Undressing Lower body dressing  What is the patient wearing?: Pants, Incontinence brief     Lower body assist Assist for lower body dressing: Total Assistance - Patient < 25%     Toileting Toileting    Toileting assist Assist for toileting: 2 Helpers     Transfers Chair/bed transfer  Transfers assist  Chair/bed transfer activity did not occur: Safety/medical concerns (did not get up)  Chair/bed transfer assist level: Maximal Assistance - Patient 25 - 49%     Locomotion Ambulation   Ambulation assist   Ambulation activity did not occur: Safety/medical concerns  Assist level: 2 helpers Assistive device: Other (comment) (R hand rail) Max distance: 67ft   Walk 10 feet activity   Assist  Walk 10 feet activity did not occur: Safety/medical concerns        Walk 50 feet activity   Assist Walk 50 feet with 2 turns activity did not occur: Safety/medical concerns         Walk 150 feet  activity   Assist Walk 150 feet activity did not occur: Safety/medical concerns         Walk 10 feet on uneven surface  activity   Assist Walk 10 feet on uneven surfaces activity did not occur: Safety/medical concerns         Wheelchair     Assist Is the patient using a wheelchair?: Yes Type of Wheelchair: Manual    Wheelchair assist level: Dependent - Patient 0%      Wheelchair 50 feet with 2 turns activity    Assist        Assist Level: Dependent - Patient 0%   Wheelchair 150 feet activity     Assist      Assist Level: Dependent - Patient 0%   Blood pressure (!) 155/65, pulse 68, temperature (!) 97.5 F (36.4 C), temperature source Oral, resp. rate 18, height 5' 1 (1.549 m), weight 66.8 kg, SpO2 96%.  Medical Problem List and Plan: 1. Functional deficits secondary to R MCA stroke             -patient may  shower             -ELOS/Goals: 16-20 days- min-mod A, goal 8/13           Con't CIR PT, OT and SLP  Team conference Wednesday =will determine length of stay  D3 started, increase to 2,000U, metanx also ordered Discussed with therapy initiating Hoyer lift training as patient wants to go home  Con't CIR PT, OT and SLP   2.  Impaired mobility: -DVT/anticoagulation:  Pharmaceutical: continue Lovenox  40mg  daily             -antiplatelet therapy: Continue ASA.  --Plavix  recommended if no further procedures planned. PEG placed without complications  3. Muscle spasms in back: ccontinue lidocaine  patches. Tylenol  500mg  TID/HS, Baclofen  5mg  added. Voltaren  gel.   --sedated from Flexeril  5 mg/tramadol  50 mg. Will decrease doses. Decrease prn Tramadol  to 50mg  q8H prn, d/c flexeril   Continue kpad  -d/c'd flexeril   8/3- Took lidoderm  patches off form yesterday  8/5- Denies pain this AM  8/6- L jaw pain- did myofascial release- said was much better and could open mouth more after done. 8/7- doing better today per pt 8/8- L jaw pain- worse  overnight- but better this AM  4. Hospital induced delirium at night: daughter may stay over at night -Conitnue melatonin 5mg  at 8 pm tonight. Sleep wake chart to monitor sleep              -  antipsychotic agents: N/A  8/7- much more able to tell me what's going on  8/8- more confused last night- will check U/A  and Cx -07/26/24 U/A not concerning, UCx with multiple species. Less confused today 5. Neuropsych/cognition: This patient may be intermittently capable of making decisions on her own behalf.  6. Daytime somnolence: modafinil  100mg  daily ordered  7. Dysphagia/dry mouth: NPO on PEG feeds 7/29, transitioned to bolus feeds, Osmolite bolus QID  continue ice chips  8/7- Pt is now on D1 nectar thick liquids and close to thin liquids per SLP- after  MBSS today  8/8- will give 300 ml bolus x1 to help flush lactic acid so trp injections work well.  8.  E coli bacteremia: Treated with 10 day course of antibiotics. Ceftriaxone -->Duricef thru 07/14. Thrombocytopenia/leucocytosis has resolved.              --Hematuria has resolved. No dysuria.   9. Chronic back/Right knee pain: SEE #3 ABOVE; Continue voltaren  gel qid to knee and back till family bring in the biofreeze (which works better) --continue Lidocaine  patches at nights to help with back spasms. Decrease flexeril  to 2.5 mg every 8 hours prn-- now d/c'd as below             --schedule tylenol  500 mg qid as LFTs resolved.   10. Abnormal LFTs: Have resolved and Lipitor 10mg  daily added on 07/19. Continue to monitor weekly for  now.   8/5- will check CMP q Monday and BMP q Thursday- will check in AM since haven't rechecked in 1 week  8/6- LFTs have normalized 11. COPD/OSA: Has CPAP but not using PTA. Continue Yupelri  nebs daily (in place of Spiriva).  --continue albuterol  every night PTA.  .  12. Hypomagnesemia: Magnesium  reviewed and has normalized  13. Anemia: Monitor for signs of bleeding. H/H recovering from 13-->9.9-->11.2  8/6- Last Hb  10.7- down from 11.3- will recheck Thursday as ordered  8/7- Hb back up to 11.3  14.  Bradycardia: HR note to be in 51 to 61 range and had boderline prolonged Qtc on admission. Likely due to stroke.  Repeat EKG reviewed and is stable  15.  H/o MDD/anxiety: continue Lexapro  40mg  daily. Wellbutrin  started 75mg  BID  16. Bowel management: Last BM 8/1, continue to monitor, d/c suppository  8/5- Will give Sorbitol  15cc- and add Senna 1 tab daily- to help her go regularly- since on Tramadol  prn  8/6- no BM yet- will give Sorbitol  30cc and if no BM, will need Fleets enema  8/7- had BM after sorbitol - per pt- but was documented at 9:30 yesterday AM- not sure if needed sorbitol ?  -07/26/24 LBM yesterday per daughter, not documented; monitor 17. R jaw/tooth pain: Continue Augmentin  BID x5 days completed to treat any potential dental infection; needs to f/up outpatient with a dentist eventually.Benzocaine  ordered -CT maxillofacial neg on 7/31 8/6- did myofascial release- pt has good results.  8/7- pt reports it's so much better- unless has to do SLP exercises- d/w SLP to see if can change up some of these exercises- see what's causing the pain?  Trigger point injections done 07/24/24 Upper traps- B/L  Levators- B/L  Posterior scalenes- B/L  Middle scalenes Splenius Capitus Pectoralis Major- B/L  Rhomboids Infraspinatus Teres Major/minor Thoracic paraspinals Lumbar paraspinals Other injections-  Dry needled SCM upper 1/3 Also injected L masseter 18. Dry mouth: d/c flexeril     19. Hematoma on L lateral periorbital area, well circumscribed  8/3- cannot identify any trauma  8/5- Is spreading due to gravity/when sits up- educated daughter this is normal- she was a little concerned.    20. Will get U/A and Cx- due to more confusion overnight.  -07/26/24 U/A without leuks/nitrites, UCx with multiple species, less confused today, monitor for now but no indication for UTI/abx      LOS: 18  days A FACE TO FACE EVALUATION WAS PERFORMED  [address] 07/26/2024, 10:44 AM

## 2024-07-26 NOTE — Progress Notes (Signed)
 Physical Therapy Session Note  Patient Details  Name: [name] MRN: [id number] Date of Birth: 02-10-1932  Today's Date: 07/26/2024 PT Individual Time: 1332-1358 PT Individual Time Calculation (min): 26 min   Short Term Goals: Week 3:  PT Short Term Goal 1 (Week 3): STG = LTG  Skilled Therapeutic Interventions/Progress Updates: Patient supine in bed with daughter present on entrance to room. Patient alert and agreeable to PT session.   Patient with no complaints of pain during session.  Pt performed supine<>sit on EOB with maxA. VC required for sequence. Pt presented with push to L. Pt noted to have soaked sheets and back of pants and transitioned back to supine for personal care. Pt required maxA to roll to L/R from supine with VC to use R LE to assist with bridging R hip over to L sidelying. Pt's daughter and rehab tech present in room. Pt with soiled brief and PTA donned new personal brief with maxA (cues for more side rolling). Pt participated in performing SLR with L LE with cues to actively look at LE while moving through ROM with PTA providing maxA (pt able to lift LE above bed few inches with minA).Pt daughter with concerns regarding swelling in [name]. PTA communicated with nsg to alert attending nsg about concern.  Patient supine in bed at end of session with brakes locked, daughter present, bed alarm set, and all needs within reach.      Therapy Documentation Precautions:  Precautions Precautions: Fall Recall of Precautions/Restrictions: Impaired Precaution/Restrictions Comments: Cortrak- NPO; dense L Hemi Restrictions Weight Bearing Restrictions Per Provider Order: No   Therapy/Group: Individual Therapy  Lasean Gorniak PTA 07/26/2024, 2:41 PM

## 2024-07-26 NOTE — Progress Notes (Signed)
 Speech Language Pathology Daily Session Note  Patient Details  Name: [name] MRN: [id number] Date of Birth: 07/03/1932  Today's Date: 07/26/2024 SLP Individual Time: 1445-1530 SLP Individual Time Calculation (min): 45 min  Short Term Goals: Week 3: SLP Short Term Goal 1 (Week 3): STGs=LTGs d/t ELOS  Skilled Therapeutic Interventions:   Pt and visitors greeted at bedside. SLP facilitated tx tasks targeting cognition and dysphagia. During initial conversation w/ SLP. she benefited from modA to attend to midline. She was also able to recall broad details of the last 2 days w/ minA. Pt requested ice chips, which were at pt's bedside. However, she and her daughter reported oral care was not completed today. SLP provided continued education re water  (ice chip) protocol, which requires adherence to consistent oral care prior to intake. No overt s/s of airway invasion noted across 3 ice chips. She also completed EMST via EMST75. Originally attempted resistance @ 20cmH2O, however, she was only able to complete ~5 reps at this resistance. Resistance then decreased to 15 cmH2O and she completed remaining 20 reps w/ modA to ensure optimal technique. She required frequent rest breaks throughout as well. At the end of tx tasks, she was left in her bed w/ the alarm set and call light within reach. Recommend cont ST per POC.   Pain Pain Assessment Pain Scale: 0-10 Pain Score: 7  Pain Location: Leg Pain Intervention(s): Medication (See eMAR)  Therapy/Group: Individual Therapy  [name] 07/26/2024, 3:11 PM

## 2024-07-27 NOTE — Progress Notes (Signed)
 Speech Language Pathology Daily Session Note  Patient Details  Name: [name] MRN: [id number] Date of Birth: [birth year]  Today's Date: 07/27/2024 SLP Individual Time: 0900-0940 SLP Individual Time Calculation (min): 40 min  Short Term Goals: Week 3: SLP Short Term Goal 1 (Week 3): STGs=LTGs d/t ELOS  Skilled Therapeutic Interventions:  Pt seen for ST targeting dysphagia and cognition goals. Pt reporting nausea and fatigue upon SLP arrival but denied pain. Daughter present for session. SLP spoke with other daughter, [name], via phone and she states she will be at [facility] all day on Mon, 8/11, and is available for training on feeding techniques. SLP to inform ST care team. SLP facilitated pharyngeal strengthing exercises and pt completed EMST @ 15cmH2O 3 sets of 5 when provided breaks in between sets. She declined CTAR exercises stating it causes her jaw pain. SLP then facilitated visual scanning tasks to address L inattention. Pt had 0% acc independently for scanning initially and 0% acc when just provided verbal and visual cueing. Improved to 83% acc when provided red flashing line, movement of items ~2 inches towards midline, and mod A. Session d/c'd early due to pt request as she was lethargic and experiencing nausea. 20 minutes missed. PA arrived at end of session. Pt left in chair with call bell in reach, chair alarm active, and daughter in room.    Pain Pain Assessment Pain Scale: 0-10 Pain Score: 0-No pain Faces Pain Scale: Hurts a little bit  Therapy/Group: Individual Therapy  Waddell JONETTA Novak, MA CCC-SLP 07/27/2024, 9:48 AM

## 2024-07-27 NOTE — Plan of Care (Signed)
  Problem: Consults Goal: RH STROKE PATIENT EDUCATION Description: See Patient Education module for education specifics  07/27/2024 1234 by Kit Geni LABOR, LPN Outcome: Progressing 07/27/2024 1234 by Kit Geni A, LPN Outcome: Progressing Goal: Nutrition Consult-if indicated 07/27/2024 1234 by Kit Geni LABOR, LPN Outcome: Progressing 07/27/2024 1234 by Kit Geni A, LPN Outcome: Progressing   Problem: RH BOWEL ELIMINATION Goal: RH STG MANAGE BOWEL WITH ASSISTANCE Description: STG Manage Bowel with min Assistance. 07/27/2024 1234 by Kit Geni LABOR, LPN Outcome: Progressing 07/27/2024 1234 by Kit Geni A, LPN Outcome: Progressing   Problem: RH BLADDER ELIMINATION Goal: RH STG MANAGE BLADDER WITH ASSISTANCE Description: STG Manage Bladder With min Assistance 07/27/2024 1234 by Kit Geni LABOR, LPN Outcome: Progressing 07/27/2024 1234 by Kit Geni A, LPN Outcome: Progressing   Problem: RH KNOWLEDGE DEFICIT Goal: RH STG INCREASE KNOWLEDGE OF DYSPHAGIA/FLUID INTAKE Description: Patient and family will be able to demonstrate understanding of dietary modifications to prevent complications related to stroke independently using handout provided.  07/27/2024 1234 by Kit Geni LABOR, LPN Outcome: Progressing 07/27/2024 1234 by Kit Geni LABOR, LPN Outcome: Progressing Goal: RH STG INCREASE KNOWLEGDE OF HYPERLIPIDEMIA Description: Patient and family will be able to demonstrate understanding of medication management and dietary modifications to better control cholesterol levels to prevent further occurrence of stroke independently using handout provided.  07/27/2024 1234 by Kit Geni A, LPN Outcome: Progressing 07/27/2024 1234 by Kit Geni LABOR, LPN Outcome: Progressing Goal: RH STG INCREASE KNOWLEDGE OF STROKE PROPHYLAXIS Description: Patient and family will be able to demonstrate understanding of medication management and dietary modifications to better control cholesterol  levels to prevent further occurrence of stroke independently using handout provided.  07/27/2024 1234 by Kit Geni LABOR, LPN Outcome: Progressing 07/27/2024 1234 by Kit Geni LABOR, LPN Outcome: Progressing   Problem: Education: Goal: Ability to describe self-care measures that may prevent or decrease complications (Diabetes Survival Skills Education) will improve 07/27/2024 1234 by Kit Geni LABOR, LPN Outcome: Progressing 07/27/2024 1234 by Kit Geni LABOR, LPN Outcome: Progressing Goal: Individualized Educational Video(s) 07/27/2024 1234 by Kit Geni LABOR, LPN Outcome: Progressing 07/27/2024 1234 by Kit Geni A, LPN Outcome: Progressing   Problem: Coping: Goal: Ability to adjust to condition or change in health will improve 07/27/2024 1234 by Kit Geni A, LPN Outcome: Progressing 07/27/2024 1234 by Kit Geni A, LPN Outcome: Progressing   Problem: Fluid Volume: Goal: Ability to maintain a balanced intake and output will improve 07/27/2024 1234 by Kit Geni LABOR, LPN Outcome: Progressing 07/27/2024 1234 by Kit Geni LABOR, LPN Outcome: Progressing   Problem: Health Behavior/Discharge Planning: Goal: Ability to identify and utilize available resources and services will improve 07/27/2024 1234 by Kit Geni LABOR, LPN Outcome: Progressing 07/27/2024 1234 by Kit Geni A, LPN Outcome: Progressing Goal: Ability to manage health-related needs will improve 07/27/2024 1234 by Kit Geni A, LPN Outcome: Progressing 07/27/2024 1234 by Kit Geni A, LPN Outcome: Progressing   Problem: Metabolic: Goal: Ability to maintain appropriate glucose levels will improve 07/27/2024 1234 by Kit Geni A, LPN Outcome: Progressing 07/27/2024 1234 by Kit Geni A, LPN Outcome: Progressing   Problem: Nutritional: Goal: Maintenance of adequate nutrition will improve Outcome: Progressing Goal: Progress toward achieving an optimal weight will improve Outcome: Progressing    Problem: RH Vision Goal: RH LTG Vision (Specify) Outcome: Progressing

## 2024-07-27 NOTE — Progress Notes (Addendum)
 Asked to call family to update, no answer x2, no voicemail option

## 2024-07-27 NOTE — Progress Notes (Signed)
 Physical Therapy Session Note  Patient Details  Name: [name] MRN: [id number] Date of Birth: 1932/09/20  Today's Date: 07/27/2024 PT Individual Time: 1416-1441 PT Individual Time Calculation (min): 25 min   Short Term Goals: Week 3:  PT Short Term Goal 1 (Week 3): STG = LTG  Skilled Therapeutic Interventions/Progress Updates:      PT arrived at 150, pt daughter present and reports pt not feeling well, and endorses nausea post lunch. Daughter requesting PT to return later at scheduled time 2:15.   PT returned at 2:16. Pt agreeable to therapy at bed level. Pt reports mild jaw pain, premedicated. PT encouraging pt to do exercises sitting EOB however pt agreeable to supine exercises only during this session.   Pt performed the following therex for B LE strengthening:   1x10 active assisted ankle pumps R LE   1x10 quad sets holding for 5 seconds R LE   1x10 SLR R LE active assisted   Pt attempted to perform all of these exercises with L LE however no contraction. Pt performed passive hip, knee and ankle ROM for contracture prevention.   Followed up regarding PRAFO. Pt endorses she is wearing it at night.   Pt supine in bed with all needs within reach and bed alarm on.   Therapy Documentation Precautions:  Precautions Precautions: Fall Recall of Precautions/Restrictions: Impaired Precaution/Restrictions Comments: Cortrak- NPO; dense L Hemi Restrictions Weight Bearing Restrictions Per Provider Order: No  Therapy/Group: Individual Therapy  [facility] [place], [place], DPT  07/27/2024, 1:54 PM

## 2024-07-27 NOTE — Progress Notes (Addendum)
 Occupational Therapy Session Note  Patient Details  Name: [name] MRN: [id number] Date of Birth: 1932-12-17  Today's Date: 07/27/2024 OT Individual Time: 0802-0850 OT Individual Time Calculation (min): 48 min    Short Term Goals: Week 3:  OT Short Term Goal 1 (Week 3): Patient will sit at edge of bed for 2-3 seconds using compensatory strategies and without external support. OT Short Term Goal 2 (Week 3): Patient will actively lean forward in midline while sitting to prepare for sit to stand OT Short Term Goal 3 (Week 3): STG=LTG due to LOS  Skilled Therapeutic Interventions/Progress Updates:      Therapy Documentation Precautions:  Precautions Precautions: Fall Recall of Precautions/Restrictions: Impaired Precaution/Restrictions Comments: Cortrak- NPO; dense L Hemi Restrictions Weight Bearing Restrictions Per Provider Order: No General:  Pt supine in bed upon OT arrival, agreeable to OT session. Daughter present.   Pain: unrated pain reported in jaw, activity, intermittent rest breaks, distractions provided for pain management, pt reports tolerable to proceed. Nsg provided pt medication during session through g tube.   ADL: OT providing skilled intervention on ADL retraining in order to increase independence with tasks and increase activity tolerance. Pt completed the following tasks at the current level of assist: Bed mobility: Max A supine>EOB with assistance managing Lt side  LB dressing: Max A bed level, pt able to assist with attempting to manage over waist with small bridging method, not quite able to lift entire buttocks off of bed, required rolling to fully manage over waist Footwear: total A EOB for socks and shoes Bathing: Min A bathing UB bed level with assistance with Rt side  Transfers: Max A +2 stand pivot from WOB>W/C, Mod A +1 for stand although required assistance to manually move RLE, OT providing knee block to Lt side   Pt seated in W/C at end of  session with W/C alarm donned, call light within reach and 4Ps assessed. Daughter present   Therapy/Group: Individual Therapy  Camie Hoe, OTD, OTR/L 07/27/2024, 12:46 PM

## 2024-07-27 NOTE — Progress Notes (Signed)
 PROGRESS NOTE   Subjective/Complaints:  Pt doing ok but slept poorly last night d/t being cold and now feels fatigued and a little nauseated-- better now though. Pain well managed, LBM 2 nights ago per daughter though it hasn't been well documented; urinating fine and no dysuria or UTI symptoms. No other complaints or concerns.    ROS:   Pt denies SOB, abd pain, CP, V/C/D, and vision changes Jaw pain -intermittent +nausea, but better now   Per HPI Objective:   No results found.     No results for input(s): WBC, HGB, HCT, PLT in the last 72 hours.    No results for input(s): NA, K, CL, CO2, GLUCOSE, BUN, CREATININE, CALCIUM  in the last 72 hours.     Intake/Output Summary (Last 24 hours) at 07/27/2024 1211 Last data filed at 07/27/2024 0813 Gross per 24 hour  Intake 360 ml  Output --  Net 360 ml        Physical Exam: Vital Signs Blood pressure (!) 152/70, pulse 70, temperature 97.8 F (36.6 C), temperature source Oral, resp. rate 16, height 5' 1 (1.549 m), weight 66.3 kg, SpO2 96%.      General: awake and alert; daughter at bedside;  NAD. Sitting up in w/c HENT: conjugate gaze; oropharynx little dry, small bruise to L periorbital area CV: regular rate and rhythm; no JVD Pulmonary: CTA B/L; no W/R/R- good air movement GI: soft, NT, ND, (+)BS Psychiatric: appropriate Neurological: alert, L inattention Skin: multiple bruises throughout skin esp on abd c/w lovenox  injections.   PRIOR EXAMS: MSK- L jaw pain is less tight- also neck tightness is also less in upper traps, levators, SCM's,  scalenes    Comments:  RUE 5-/5 LUE 0/5 throughout RLE- 4+/5 throughout- hard to get pt to do exam LLE- PF 1/5- otherwise 0/5 in LLE, stable 8/1  Neurological:     Mental Status: She is alert.     Comments: Mild dysarthria with occasional wet voice. Left facial weakness with left inattention.  Left HH and needs cues to look to left. A. She was able to answer basic questions, name and place, but thought it was SUnday , not Monday   Left hemiparesis with sensory deficits.  No clonus, no hoffman's  Flaccid tone on L side  Psychiatric:     Comments: Alert and smiling   Assessment/Plan: 1. Functional deficits which require 3+ hours per day of interdisciplinary therapy in a comprehensive inpatient rehab setting. Physiatrist is providing close team supervision and 24 hour management of active medical problems listed below. Physiatrist and rehab team continue to assess barriers to discharge/monitor patient progress toward functional and medical goals  Care Tool:  Bathing    Body parts bathed by patient: Face   Body parts bathed by helper: Right arm, Left arm, Front perineal area, Buttocks, Right upper leg, Left upper leg, Right lower leg, Left lower leg, Chest, Abdomen     Bathing assist Assist Level: Total Assistance - Patient < 25%     Upper Body Dressing/Undressing Upper body dressing   What is the patient wearing?: Pull over shirt    Upper body assist Assist Level: Maximal Assistance - Patient  25 - 49%    Lower Body Dressing/Undressing Lower body dressing      What is the patient wearing?: Pants, Incontinence brief     Lower body assist Assist for lower body dressing: Total Assistance - Patient < 25%     Toileting Toileting    Toileting assist Assist for toileting: 2 Helpers     Transfers Chair/bed transfer  Transfers assist  Chair/bed transfer activity did not occur: Safety/medical concerns (did not get up)  Chair/bed transfer assist level: Maximal Assistance - Patient 25 - 49%     Locomotion Ambulation   Ambulation assist   Ambulation activity did not occur: Safety/medical concerns  Assist level: 2 helpers Assistive device: Other (comment) (R hand rail) Max distance: 5ft   Walk 10 feet activity   Assist  Walk 10 feet activity did not  occur: Safety/medical concerns        Walk 50 feet activity   Assist Walk 50 feet with 2 turns activity did not occur: Safety/medical concerns         Walk 150 feet activity   Assist Walk 150 feet activity did not occur: Safety/medical concerns         Walk 10 feet on uneven surface  activity   Assist Walk 10 feet on uneven surfaces activity did not occur: Safety/medical concerns         Wheelchair     Assist Is the patient using a wheelchair?: Yes Type of Wheelchair: Manual    Wheelchair assist level: Dependent - Patient 0%      Wheelchair 50 feet with 2 turns activity    Assist        Assist Level: Dependent - Patient 0%   Wheelchair 150 feet activity     Assist      Assist Level: Dependent - Patient 0%   Blood pressure (!) 152/70, pulse 70, temperature 97.8 F (36.6 C), temperature source Oral, resp. rate 16, height 5' 1 (1.549 m), weight 66.3 kg, SpO2 96%.  Medical Problem List and Plan: 1. Functional deficits secondary to R MCA stroke             -patient may  shower             -ELOS/Goals: 16-20 days- min-mod A, goal 8/13           Con't CIR PT, OT and SLP  Team conference Wednesday =will determine length of stay  D3 started, increase to 2,000U, metanx also ordered Discussed with therapy initiating Hoyer lift training as patient wants to go home  Con't CIR PT, OT and SLP   2.  Impaired mobility: -DVT/anticoagulation:  Pharmaceutical: continue Lovenox  40mg  daily             -antiplatelet therapy: Continue ASA.  --Plavix  recommended if no further procedures planned. PEG placed without complications  3. Muscle spasms in back: ccontinue lidocaine  patches. Tylenol  500mg  TID/HS, Baclofen  5mg  added. Voltaren  gel.   --sedated from Flexeril  5 mg/tramadol  50 mg. Will decrease doses. Decrease prn Tramadol  to 50mg  q8H prn, d/c flexeril   Continue kpad  -d/c'd flexeril   8/3- Took lidoderm  patches off form yesterday  8/5- Denies pain  this AM 8/6- L jaw pain- did myofascial release- said was much better and could open mouth more after done. 8/7- doing better today per pt 8/8- L jaw pain- worse overnight- but better this AM  4. Hospital induced delirium at night: daughter may stay over at night -Conitnue melatonin 5mg  at 8 pm  tonight. Sleep wake chart to monitor sleep              -antipsychotic agents: N/A  8/7- much more able to tell me what's going on  8/8- more confused last night- will check U/A  and Cx -07/26/24 U/A not concerning, UCx with multiple species. Less confused today-- will not resend, especially since U/A basically normal (neg leuks/nitrites)  5. Neuropsych/cognition: This patient may be intermittently capable of making decisions on her own behalf.  6. Daytime somnolence: modafinil  100mg  daily ordered  7. Dysphagia/dry mouth: NPO on PEG feeds 7/29, transitioned to bolus feeds, Osmolite bolus QID  continue ice chips  8/7- Pt is now on D1 nectar thick liquids and close to thin liquids per SLP- after  MBSS today  8/8- will give 300 ml bolus x1 to help flush lactic acid so trp injections work well.  8.  E coli bacteremia: Treated with 10 day course of antibiotics. Ceftriaxone -->Duricef thru 07/14. Thrombocytopenia/leucocytosis has resolved.              --Hematuria has resolved. No dysuria.   9. Chronic back/Right knee pain: SEE #3 ABOVE; Continue voltaren  gel qid to knee and back till family bring in the biofreeze (which works better) --continue Lidocaine  patches at nights to help with back spasms. Decrease flexeril  to 2.5 mg every 8 hours prn-- now d/c'd as below             --schedule tylenol  500 mg qid as LFTs resolved.   10. Abnormal LFTs: Have resolved and Lipitor 10mg  daily added on 07/19. Continue to monitor weekly for  now.   8/5- will check CMP q Monday and BMP q Thursday- will check in AM since haven't rechecked in 1 week  8/6- LFTs have normalized 11. COPD/OSA: Has CPAP but not using PTA.  Continue Yupelri  nebs daily (in place of Spiriva).  --continue albuterol  every night PTA.  .  12. Hypomagnesemia: Magnesium  reviewed and has normalized  13. Anemia: Monitor for signs of bleeding. H/H recovering from 13-->9.9-->11.2  8/6- Last Hb 10.7- down from 11.3- will recheck Thursday as ordered  8/7- Hb back up to 11.3  14.  Bradycardia: HR note to be in 51 to 61 range and had boderline prolonged Qtc on admission. Likely due to stroke.  Repeat EKG reviewed and is stable  15.  H/o MDD/anxiety: continue Lexapro  40mg  daily. Wellbutrin  started 75mg  BID  16. Bowel management: Last BM 8/1, continue to monitor, d/c suppository  8/5- Will give Sorbitol  15cc- and add Senna 1 tab daily- to help her go regularly- since on Tramadol  prn  8/6- no BM yet- will give Sorbitol  30cc and if no BM, will need Fleets enema  8/7- had BM after sorbitol - per pt- but was documented at 9:30 yesterday AM- not sure if needed sorbitol ?  -07/26/24 LBM yesterday per daughter, not documented; monitor -07/27/24 no BM documented since 8/7 (smear) and 8/6 (medium)-- daughter yesterday said she had a BM the night before (8/8) but now isn't really sure-- monitor today, if no BM by Monday then might warrant further management 17. R jaw/tooth pain: Continue Augmentin  BID x5 days completed to treat any potential dental infection; needs to f/up outpatient with a dentist eventually.Benzocaine  ordered -CT maxillofacial neg on 7/31 8/6- did myofascial release- pt has good results.  8/7- pt reports it's so much better- unless has to do SLP exercises- d/w SLP to see if can change up some of these exercises- see what's causing the pain?  Trigger point injections done 07/24/24 Upper traps- B/L  Levators- B/L  Posterior scalenes- B/L  Middle scalenes Splenius Capitus Pectoralis Major- B/L  Rhomboids Infraspinatus Teres Major/minor Thoracic paraspinals Lumbar paraspinals Other injections-  Dry needled SCM upper 1/3 Also  injected L masseter 18. Dry mouth: d/c flexeril     19. Hematoma on L lateral periorbital area, well circumscribed  8/3- cannot identify any trauma   8/5- Is spreading due to gravity/when sits up- educated daughter this is normal- she was a little concerned.    20. Will get U/A and Cx- due to more confusion overnight.  -07/26/24 U/A without leuks/nitrites, UCx with multiple species, less confused today, monitor for now but no indication for UTI/abx      LOS: 19 days A FACE TO FACE EVALUATION WAS PERFORMED  [address] 07/27/2024, 12:11 PM

## 2024-07-28 ENCOUNTER — Inpatient Hospital Stay (HOSPITAL_COMMUNITY)

## 2024-07-28 DIAGNOSIS — I6389 Other cerebral infarction: Secondary | ICD-10-CM | POA: Diagnosis not present

## 2024-07-28 LAB — COMPREHENSIVE METABOLIC PANEL WITH GFR
ALT: 25 U/L (ref 0–44)
AST: 23 U/L (ref 15–41)
Albumin: 3 g/dL — ABNORMAL LOW (ref 3.5–5.0)
Alkaline Phosphatase: 85 U/L (ref 38–126)
Anion gap: 9 (ref 5–15)
BUN: 16 mg/dL (ref 8–23)
CO2: 26 mmol/L (ref 22–32)
Calcium: 8.9 mg/dL (ref 8.9–10.3)
Chloride: 102 mmol/L (ref 98–111)
Creatinine, Ser: 0.57 mg/dL (ref 0.44–1.00)
GFR, Estimated: >60 mL/min (ref >60)
Glucose, Bld: 99 mg/dL (ref 70–99)
Potassium: 4.2 mmol/L (ref 3.5–5.1)
Sodium: 137 mmol/L (ref 135–145)
Total Bilirubin: 0.7 mg/dL (ref 0.0–1.2)
Total Protein: 5.6 g/dL — ABNORMAL LOW (ref 6.5–8.1)

## 2024-07-28 LAB — CBC
HCT: 34 % — ABNORMAL LOW (ref 36.0–46.0)
Hemoglobin: 11.3 g/dL — ABNORMAL LOW (ref 12.0–15.0)
MCH: 31.7 pg (ref 26.0–34.0)
MCHC: 33.2 g/dL (ref 30.0–36.0)
MCV: 95.5 fL (ref 80.0–100.0)
Platelets: 146 K/uL — ABNORMAL LOW (ref 150–400)
RBC: 3.56 MIL/uL — ABNORMAL LOW (ref 3.87–5.11)
RDW: 14.7 % (ref 11.5–15.5)
WBC: 6.4 K/uL (ref 4.0–10.5)
nRBC: 0 % (ref 0.0–0.2)

## 2024-07-28 MED ORDER — OSMOLITE 1.5 CAL PO LIQD
237.0000 mL | Freq: Three times a day (TID) | ORAL | Status: DC
  Administered 2024-07-28 (×2): 237 mL

## 2024-07-28 MED ORDER — BACLOFEN 5 MG HALF TABLET
5.0000 mg | ORAL_TABLET | Freq: Every day | ORAL | Status: DC | PRN
  Administered 2024-07-31: 5 mg via ORAL
  Filled 2024-07-28: qty 1

## 2024-07-28 MED ORDER — OSMOLITE 1.5 CAL PO LIQD
237.0000 mL | Freq: Three times a day (TID) | ORAL | Status: DC
  Administered 2024-07-28 – 2024-07-30 (×14): 237 mL

## 2024-07-28 MED ORDER — FREE WATER
120.0000 mL | Freq: Three times a day (TID) | Status: DC
  Administered 2024-07-28 – 2024-07-31 (×19): 120 mL

## 2024-07-28 NOTE — Progress Notes (Signed)
 Patient ID: [name], female   DOB: [birth year], 89 y.o.   MRN: [id number]  Met with daughter-[name] to give local number for [employer] and she was asking about the nebulizer she feels it is really helpful. MD has agreed to this have placed order to [employer] and also inquired about when the tube feedings would be delivered to their home. Pt is confused today and MD is aware of this

## 2024-07-28 NOTE — Progress Notes (Signed)
 Clarified new order for CT Scan with MD. MD ordered CT- Scan, therapist reported patient had worsening to lean and some onset confusion during session.    Geni Armor, LPN

## 2024-07-28 NOTE — Plan of Care (Signed)
  Problem: Consults Goal: RH STROKE PATIENT EDUCATION Description: See Patient Education module for education specifics  Outcome: Progressing Goal: Nutrition Consult-if indicated Outcome: Progressing

## 2024-07-28 NOTE — Progress Notes (Signed)
 Discussed CT results with Dr. Rosemarie. Patient with fatigue and some confusion reported. Also not that she got 2 extra doses of ultram  50 mg as well as buspar  last night.   He reported d/c Plavix --3 week out so no longer  needed. Lovenox  can continue due to DVT risk (held for 24 hrs in case of hemorrhage). Repeat CT if neurological decline. Will monitor for now.

## 2024-07-28 NOTE — Plan of Care (Signed)
 Incontinent of bladder; managed with brief; toileting

## 2024-07-28 NOTE — Progress Notes (Signed)
 Speech Language Pathology Daily Session Note  Patient Details  Name: SKYLIE HIOTT MRN: [id number] Date of Birth: 1932-06-06  Today's Date: 07/28/2024 SLP Individual Time: 0152-0250 SLP Individual Time Calculation (min): 58 min  Short Term Goals: Week 3: SLP Short Term Goal 1 (Week 3): STGs=LTGs d/t ELOS  Skilled Therapeutic Interventions:  Patient was seen in PM to address dysphagia management and cognitive re- training. Pt was alert upon SLP arrival and agreeable for session. Her dtr [name] was present and an active participant during session. SLP initiated session through following up on a request made earlier in the day to educate pt's dtr on swallowing strategies, safety, and to provided insight onto current diet recommendations. SLP educated pt's dtr on general safe swallowing strategies including upright positioning during and after meal, oral hygiene consistent with water  protocol (prior to all ice chip trials), slow rate, and small single bites/ sips. Further education completed on diet recommendations for D1 and nectar liquids. Handout provided on foods allowed and not allowed given current recommendations. Education also completed on s/s aspiration. Pt's dtr with good comprehension of information with ability to teach back information and add additional insight as appropriate. In other minutes of session, SLP facilitated ice chip trials. Pt presented with suction toothbrush. Pt noted with inappropriate cessation of task warranting tactile stimulation to cease task. She was presented with small ice chips. Pt with no s/s aspiration in 5/5 trials. Intermittently throughout session, SLP challenging pt's inattention to left side. Given min to mod cues pt identified objects on the left side of room with 75% acc. Pt answered temporal and spatial orientation questions with min A.. At conclusion of session, pt was left in bed with call button within reach, bed alarm active, and dtr present. SLP to  continue POC.   Pain Pain Assessment Pain Scale: Faces Pain Location: Back Pain Intervention(s): Medication (See eMAR)  Therapy/Group: Individual Therapy  Joane GORMAN Fuss 07/28/2024, 3:46 PM

## 2024-07-28 NOTE — Progress Notes (Signed)
 PROGRESS NOTE   Subjective/Complaints: Daughter and team note increased confusion and left sided lean today- stat head CT ordered, spoke with radiologist and it shows small amount of hemorrhagic conversion   ROS:   Pt denies SOB, abd pain, CP, V/C/D, and vision changes Jaw pain -intermittent +nausea, but better now +left sided lean   Per HPI Objective:   No results found.     Recent Labs    07/28/24 0537  WBC 6.4  HGB 11.3*  HCT 34.0*  PLT 146*      Recent Labs    07/28/24 0537  NA 137  K 4.2  CL 102  CO2 26  GLUCOSE 99  BUN 16  CREATININE 0.57  CALCIUM  8.9       Intake/Output Summary (Last 24 hours) at 07/28/2024 1115 Last data filed at 07/28/2024 0803 Gross per 24 hour  Intake 600 ml  Output --  Net 600 ml        Physical Exam: Vital Signs Blood pressure (!) 154/59, pulse 70, temperature 97.6 F (36.4 C), temperature source Oral, resp. rate 16, height 5' 1 (1.549 m), weight 66.3 kg, SpO2 99%.      General: awake and alert; daughter at bedside;  NAD. Sitting up in w/c HENT: conjugate gaze; oropharynx little dry, small bruise to L periorbital area CV: regular rate and rhythm; no JVD Pulmonary: CTA B/L; no W/R/R- good air movement GI: soft, NT, ND, (+)BS Psychiatric: appropriate Neurological: alert, L inattention Skin: multiple bruises throughout skin esp on abd c/w lovenox  injections.   PRIOR EXAMS: MSK- L jaw pain is less tight- also neck tightness is also less in upper traps, levators, SCM's,  scalenes    Comments:  RUE 5-/5 LUE 0/5 throughout RLE- 4+/5 throughout- hard to get pt to do exam LLE- PF 1/5- otherwise 0/5 in LLE, stable 8/11  Neurological:     Mental Status: She is alert.     Comments: Mild dysarthria with occasional wet voice. Left facial weakness with left inattention. Left HH and needs cues to look to left. A. She was able to answer basic questions, name and  place, but thought it was SUnday , not Monday   Left hemiparesis with sensory deficits.  No clonus, no hoffman's  Flaccid tone on L side  Psychiatric:     Comments: Alert and smiling   Assessment/Plan: 1. Functional deficits which require 3+ hours per day of interdisciplinary therapy in a comprehensive inpatient rehab setting. Physiatrist is providing close team supervision and 24 hour management of active medical problems listed below. Physiatrist and rehab team continue to assess barriers to discharge/monitor patient progress toward functional and medical goals  Care Tool:  Bathing    Body parts bathed by patient: Face   Body parts bathed by helper: Right arm, Left arm, Front perineal area, Buttocks, Right upper leg, Left upper leg, Right lower leg, Left lower leg, Chest, Abdomen     Bathing assist Assist Level: Total Assistance - Patient < 25%     Upper Body Dressing/Undressing Upper body dressing   What is the patient wearing?: Pull over shirt    Upper body assist Assist Level: Maximal Assistance -  Patient 25 - 49%    Lower Body Dressing/Undressing Lower body dressing      What is the patient wearing?: Pants, Incontinence brief     Lower body assist Assist for lower body dressing: Total Assistance - Patient < 25%     Toileting Toileting    Toileting assist Assist for toileting: 2 Helpers     Transfers Chair/bed transfer  Transfers assist  Chair/bed transfer activity did not occur: Safety/medical concerns (did not get up)  Chair/bed transfer assist level: Maximal Assistance - Patient 25 - 49%     Locomotion Ambulation   Ambulation assist   Ambulation activity did not occur: Safety/medical concerns  Assist level: 2 helpers Assistive device: Other (comment) (R hand rail) Max distance: 43ft   Walk 10 feet activity   Assist  Walk 10 feet activity did not occur: Safety/medical concerns        Walk 50 feet activity   Assist Walk 50 feet with  2 turns activity did not occur: Safety/medical concerns         Walk 150 feet activity   Assist Walk 150 feet activity did not occur: Safety/medical concerns         Walk 10 feet on uneven surface  activity   Assist Walk 10 feet on uneven surfaces activity did not occur: Safety/medical concerns         Wheelchair     Assist Is the patient using a wheelchair?: Yes Type of Wheelchair: Manual    Wheelchair assist level: Dependent - Patient 0%      Wheelchair 50 feet with 2 turns activity    Assist        Assist Level: Dependent - Patient 0%   Wheelchair 150 feet activity     Assist      Assist Level: Dependent - Patient 0%   Blood pressure (!) 154/59, pulse 70, temperature 97.6 F (36.4 C), temperature source Oral, resp. rate 16, height 5' 1 (1.549 m), weight 66.3 kg, SpO2 99%.  Medical Problem List and Plan: 1. Functional deficits secondary to R MCA stroke             -patient may  shower             -ELOS/Goals: 16-20 days- min-mod A, goal 8/13           Con't CIR PT, OT and SLP  Team conference Wednesday =will determine length of stay  D3 started, increase to 2,000U, metanx also ordered Discussed with therapy initiating Hoyer lift training as patient wants to go home  Con't CIR PT, OT and SLP   2.  Impaired mobility: -DVT/anticoagulation:  Pharmaceutical: continue Lovenox  40mg  daily             -antiplatelet therapy: Continue ASA.  --Plavix  recommended if no further procedures planned. PEG placed without complications  3. Muscle spasms in back: ccontinue lidocaine  patches. Tylenol  500mg  TID/HS, Baclofen  5mg  added. Voltaren  gel.   --sedated from Flexeril  5 mg/tramadol  50 mg. Will decrease doses. Decrease prn Tramadol  to 50mg  q8H prn, d/c flexeril   Continue kpad  -d/c'd flexeril   8/3- Took lidoderm  patches off form yesterday  8/5- Denies pain this AM 8/6- L jaw pain- did myofascial release- said was much better and could open mouth  more after done. 8/7- doing better today per pt 8/8- L jaw pain- worse overnight- but better this AM  4. Hospital induced delirium at night: daughter may stay over at night -Conitnue melatonin 5mg  at 8  pm tonight. Sleep wake chart to monitor sleep              -antipsychotic agents: N/A  8/7- much more able to tell me what's going on  8/8- more confused last night- will check U/A  and Cx -07/26/24 U/A not concerning, UCx with multiple species. Less confused today-- will not resend, especially since U/A basically normal (neg leuks/nitrites)  5. Neuropsych/cognition: This patient may be intermittently capable of making decisions on her own behalf.  6. Daytime somnolence: modafinil  100mg  daily ordered  7. Dysphagia/dry mouth: NPO on PEG feeds 7/29, transitioned to bolus feeds, Osmolite bolus QID  continue ice chips  8/7- Pt is now on D1 nectar thick liquids and close to thin liquids per SLP- after  MBSS today  8/8- will give 300 ml bolus x1 to help flush lactic acid so trp injections work well.  8.  E coli bacteremia: Treated with 10 day course of antibiotics. Ceftriaxone -->Duricef thru 07/14. Thrombocytopenia/leucocytosis has resolved.              --Hematuria has resolved. No dysuria.   9. Chronic back/Right knee pain: SEE #3 ABOVE; Continue voltaren  gel qid to knee and back till family bring in the biofreeze (which works better) --continue Lidocaine  patches at nights to help with back spasms. Decrease flexeril  to 2.5 mg every 8 hours prn-- now d/c'd as below             --schedule tylenol  500 mg qid as LFTs resolved.   10. Abnormal LFTs: Have resolved and Lipitor 10mg  daily added on 07/19. Continue to monitor weekly for  now.   8/5- will check CMP q Monday and BMP q Thursday- will check in AM since haven't rechecked in 1 week  8/6- LFTs have normalized 11. COPD/OSA: Has CPAP but not using PTA. Continue Yupelri  nebs daily (in place of Spiriva).  --continue albuterol  every night PTA.  .   12. Hypomagnesemia: Magnesium  reviewed and has normalized  13. Anemia: Hgb reviewed and is stable  14.  Bradycardia: HR reviewed and has normalized  15.  H/o MDD/anxiety: continue Lexapro  40mg  daily. Wellbutrin  started 75mg  BID, continue prn buspar   16. Bowel management:   Discussed that she had BM 8/11  17. R jaw/tooth pain: Completed augmentin  for potential dental infection, needs to f/up outpatient with a dentist eventually.Benzocaine  ordered -CT maxillofacial neg on 7/31 8/6- did myofascial release- pt has good results.  8/7- pt reports it's so much better- unless has to do SLP exercises- d/w SLP to see if can change up some of these exercises- see what's causing the pain? Added baclofen  prn  Trigger point injections done 07/24/24 Upper traps- B/L  Levators- B/L  Posterior scalenes- B/L  Middle scalenes Splenius Capitus Pectoralis Major- B/L  Rhomboids Infraspinatus Teres Major/minor Thoracic paraspinals Lumbar paraspinals Other injections-  Dry needled SCM upper 1/3 Also injected L masseter  18. Dry mouth: d/c flexeril     19. Hematoma on L lateral periorbital area, well circumscribed  8/3- cannot identify any trauma   8/5- Is spreading due to gravity/when sits up- educated daughter this is normal- she was a little concerned.    20. Altered mental status: CT head ordered and shows mild hemorrhagic conversion, will consult neurology, daughter and therapy updated     LOS: 20 days A FACE TO FACE EVALUATION WAS PERFORMED  Khamil Lamica P Chaysen Tillman 07/28/2024, 11:15 AM

## 2024-07-28 NOTE — Progress Notes (Signed)
 Physical Therapy Session Note  Patient Details  Name: [name] MRN: [id number] Date of Birth: [birth year]  Today's Date: 07/28/2024 PT Individual Time: 0900-1000 PT Individual Time Calculation (min): 60 min   Short Term Goals: Week 3:  PT Short Term Goal 1 (Week 3): STG = LTG  Skilled Therapeutic Interventions/Progress Updates:      Pt sitting up in wheelchair with her daughter [name] at the bedside. Pt reports lower back and L jaw pain - mobility and positioning provided for pain management. Focused session on completing custom speciality wheelchair evaluation with [employer] from [employer].  Patient transported to day room gym. Assisted onto mat table with maxA squat pivot transfer towards her weaker L side- patient holding onto arm rests with her RUE and resisting transfer instead of aiding. Needing +2 assist for safety due to holding arm rests and unable to safely mobilize to mat table.   Once sitting on mat table, presents with heavy L lean that she will not correct with delayed to absent righting. She also leans posteriorly but is more aware of this than L lean. Worked on supported sitting for measurements for custom w/c, as well as for improving safety with functional transfers and ADLs. Patient able to sit EOM for >10 minutes before requesting to lie down due to fatigue. Once returned to upright, patient reporting nausea but this resolves after a few minutes of rest and time for acclimating to upright. MaxA for squat pivot transfer to her stronger R side although improved understanding of sequencing and method of transfer. Returned to her room and patient requesting to return to bed to rest - max/totalA for squat pivot transfer towards her L side as patient continued to hold onto the R arm rests and not letting go. TotalA for assisting her into supine position. Daughter assisted with boosting her to [facility]. Needs met, alarm on.   Custom features for wheelchair: -[employer]  FT 18x18 -Manual tilt on manual chair [phone]) -Standard push handles -Angle adjustable back -Fully adjustable and removable rear wheel -Standard rims with pneumatic and flat free insert tires -push to lock wheel brakes -anti-tippers -adjustable, removable, full length pads for armrests -padded 1/2 lap tray for LUE -elevating, swing away, articulating, lift off leg rests -Jay Fusion Gel seat cushion -Active Back Deep with removable back mounts -anterior chest strap and padded hip belt -headrest and removable mounting hardware    Therapy Documentation Precautions:  Precautions Precautions: Fall Recall of Precautions/Restrictions: Impaired Precaution/Restrictions Comments: Cortrak- NPO; dense L Hemi Restrictions Weight Bearing Restrictions Per Provider Order: No General:      Therapy/Group: Individual Therapy  Selina Tapper P Kenyana Husak PT 07/28/2024, 7:49 AM

## 2024-07-28 NOTE — Progress Notes (Signed)
 Occupational Therapy Session Note  Patient Details  Name: [name] MRN: [id number] Date of Birth: 1932-01-10  Today's Date: 07/28/2024 OT Individual Time: 9197-9154 OT Individual Time Calculation (min): 43 min    Short Term Goals: Week 3:  OT Short Term Goal 1 (Week 3): Patient will sit at edge of bed for 2-3 seconds using compensatory strategies and without external support. OT Short Term Goal 2 (Week 3): Patient will actively lean forward in midline while sitting to prepare for sit to stand OT Short Term Goal 3 (Week 3): STG=LTG due to LOS  Skilled Therapeutic Interventions/Progress Updates:   Patient received supine in bed awake.  Patient greeted OT once in her midline.  Patient indicating need to void, and assisted to sit edge of bed, then to wheelchair.  Patient with strong posterior lean, unable to correct even with external support.  Patient needing mod assist for static sitting.  At the end of last week patient could maintain static sitting with min assist and even moments of close supervision.  Attempted different wheelchair to help assess best fit for custom chair.  Patient needing max assist to stand, then max/total assist to transition to sitting - fearful, gripping surfaces and pushing toward extension.  Patient transported to bathroom, and transferred to toilet with max assist.  Patient confused and disoriented at this time.  She thought she was at her daughter's home, and wasn't sure how she arrived.  She was worried about her mother being cold last night - patient was apparently damp in the bed and felt cold all night.  Patient reports facial pain/ spasm in left cheek.  Patient holds cheek, and used swab to rub against spasm.  Patient distracted by pain and this further limits ability to participate.  Patient did have continent void on commode and able to complete toileting with second caregiver.  Patient left up in wheelchair with daughter at bedside.  Secure message sent to  team regarding patient's confusion and change in physical performance.    Therapy Documentation Precautions:  Precautions Precautions: Fall Recall of Precautions/Restrictions: Impaired Precaution/Restrictions Comments: Cortrak- NPO; dense L Hemi Restrictions Weight Bearing Restrictions Per Provider Order: No  Pain: Pain Assessment Pain Scale: Not scored - significant  Pain Location: left face Pain Intervention(s): soft tissue  self mob      Therapy/Group: Individual Therapy  [name] 07/28/2024, 1:04 PM

## 2024-07-28 NOTE — Plan of Care (Signed)
  Problem: Consults Goal: RH STROKE PATIENT EDUCATION Description: See Patient Education module for education specifics  Outcome: Progressing Goal: Nutrition Consult-if indicated Outcome: Progressing   Problem: RH BOWEL ELIMINATION Goal: RH STG MANAGE BOWEL WITH ASSISTANCE Description: STG Manage Bowel with min Assistance. Outcome: Progressing   Problem: RH BLADDER ELIMINATION Goal: RH STG MANAGE BLADDER WITH ASSISTANCE Description: STG Manage Bladder With min Assistance Outcome: Progressing   Problem: RH KNOWLEDGE DEFICIT Goal: RH STG INCREASE KNOWLEDGE OF DYSPHAGIA/FLUID INTAKE Description: Patient and family will be able to demonstrate understanding of dietary modifications to prevent complications related to stroke independently using handout provided.  Outcome: Progressing Goal: RH STG INCREASE KNOWLEGDE OF HYPERLIPIDEMIA Description: Patient and family will be able to demonstrate understanding of medication management and dietary modifications to better control cholesterol levels to prevent further occurrence of stroke independently using handout provided.  Outcome: Progressing Goal: RH STG INCREASE KNOWLEDGE OF STROKE PROPHYLAXIS Description: Patient and family will be able to demonstrate understanding of medication management and dietary modifications to better control cholesterol levels to prevent further occurrence of stroke independently using handout provided.  Outcome: Progressing   Problem: Education: Goal: Ability to describe self-care measures that may prevent or decrease complications (Diabetes Survival Skills Education) will improve Outcome: Progressing Goal: Individualized Educational Video(s) Outcome: Progressing   Problem: Coping: Goal: Ability to adjust to condition or change in health will improve Outcome: Progressing   Problem: Fluid Volume: Goal: Ability to maintain a balanced intake and output will improve Outcome: Progressing   Problem: Health  Behavior/Discharge Planning: Goal: Ability to identify and utilize available resources and services will improve Outcome: Progressing Goal: Ability to manage health-related needs will improve Outcome: Progressing   Problem: Metabolic: Goal: Ability to maintain appropriate glucose levels will improve Outcome: Progressing   Problem: Nutritional: Goal: Maintenance of adequate nutrition will improve Outcome: Progressing Goal: Progress toward achieving an optimal weight will improve Outcome: Progressing   Problem: RH Vision Goal: RH LTG Vision (Specify) Outcome: Progressing

## 2024-07-28 NOTE — Progress Notes (Signed)
 Notified Delle Reining, [place] of CT results.    Tilden Dome, LPN

## 2024-07-29 ENCOUNTER — Inpatient Hospital Stay (HOSPITAL_COMMUNITY)

## 2024-07-29 NOTE — Progress Notes (Signed)
 Daughter [name] administered bolus feed with no issues. Reinforced education on bolus feed/ peg-tube maintenance.   Geni Armor, LPN

## 2024-07-29 NOTE — Progress Notes (Signed)
 PA aware of CT results.   Geni Armor, LPN

## 2024-07-29 NOTE — Progress Notes (Signed)
 Occupational Therapy Session Note  Patient Details  Name: [name] MRN: [id number] Date of Birth: March 04, 1932  Today's Date: 07/29/2024 OT Individual Time: 9097-9054 OT Individual Time Calculation (min): 43 min    Short Term Goals: Week 3:  OT Short Term Goal 1 (Week 3): Patient will sit at edge of bed for 2-3 seconds using compensatory strategies and without external support. OT Short Term Goal 2 (Week 3): Patient will actively lean forward in midline while sitting to prepare for sit to stand OT Short Term Goal 3 (Week 3): STG=LTG due to LOS  Skilled Therapeutic Interventions/Progress Updates:   Patient agreeable to participate in OT session. Reports no pain level.   Patient participated in skilled OT session focusing on self care, sit to stands, static and dynamic sitting, orientation to midline. Patient received talking to MD with daughter supine in bed. Completed LB dressing max A with bridge unassisted in supine. EOB with mod to max A. Sititng EOB CG with progression into mod to max A for maintaining midline and proper seated position. OT utilized adaptive technique with R side lean and pushing back up to midline to provide increased spatial orientation. Completed transfer to wc with max A. Grooming set up. Completed transfer back to bed on stronger R side with mod to max A, decreased balance. Scooted to [place] with daughter assist x2 assist.  Therapy Documentation Precautions:  Precautions Precautions: Fall Recall of Precautions/Restrictions: Impaired Precaution/Restrictions Comments: Cortrak- NPO; dense L Hemi Restrictions Weight Bearing Restrictions Per Provider Order: No  Therapy/Group: Individual Therapy  [name] 07/29/2024, 7:48 AM

## 2024-07-29 NOTE — Progress Notes (Signed)
 Speech Language Pathology Daily Session Note  Patient Details  Name: [name] MRN: [id number] Date of Birth: 11/12/1932  Today's Date: 07/29/2024 SLP Individual Time: 1446-1530 SLP Individual Time Calculation (min): 44 min  Short Term Goals: Week 3: SLP Short Term Goal 1 (Week 3): STGs=LTGs d/t ELOS  Skilled Therapeutic Interventions: Skilled therapy session focused on family education, dysphagia and cognitive goals. SLP facilitated session by educating patients daughter on thickiening liquids to NTL consistency. SLP provided hands on demonstration of steps and answered questions by patient/family. SLP targeted dysphagia goals through offered PO of D2 solids and NTL. Patient with timely mastication and complete oral clearance of solids. X1 delayed throat clear at the end of PO intake, though no other s/sx of aspiration. Continue current diet at this time with plans for completion of D2 meal tray to ensure patient has endurance to consume adequate intake. SLP targeted cognitive goals through orientation task. Patint independently oriented to self, situation, time and location. Patient recalled activities in 1/2 therapies this AM independently. At the end of the session, patient completed x25 repetitions of expiratory muscle strength training at 15 cm H2O. Patient left in chair with alarm set and call bell in reach. Continue POC.   Pain Denies  Therapy/Group: Individual Therapy  Alesi Zachery M.A., CCC-SLP 07/29/2024, 12:53 PM

## 2024-07-29 NOTE — Progress Notes (Signed)
 PROGRESS NOTE   Subjective/Complaints: Patient complains of new onset headache, given hemorrhagic conversion identified yesterday will order repeat CT head to ensure this is not worsening   ROS:   Pt denies SOB, abd pain, CP, V/C/D, and vision changes Jaw pain -intermittent +nausea, but better now +left sided lean +new onset headache   Per HPI Objective:     Recent Labs    07/28/24 0537  WBC 6.4  HGB 11.3*  HCT 34.0*  PLT 146*      Recent Labs    07/28/24 0537  NA 137  K 4.2  CL 102  CO2 26  GLUCOSE 99  BUN 16  CREATININE 0.57  CALCIUM  8.9       Intake/Output Summary (Last 24 hours) at 07/29/2024 0943 Last data filed at 07/29/2024 0700 Gross per 24 hour  Intake 450 ml  Output --  Net 450 ml        Physical Exam: Vital Signs Blood pressure (!) 137/48, pulse 66, temperature 98.6 F (37 C), temperature source Oral, resp. rate 18, height 5' 1 (1.549 m), weight 66 kg, SpO2 97%.      General: awake and alert; daughter at bedside;  NAD. Sitting up in w/c HENT: conjugate gaze; oropharynx little dry, small bruise to L periorbital area CV: regular rate and rhythm; no JVD Pulmonary: CTA B/L; no W/R/R- good air movement GI: soft, NT, ND, (+)BS Psychiatric: appropriate Neurological: alert, L inattention Skin: multiple bruises throughout skin esp on abd c/w lovenox  injections.   PRIOR EXAMS: MSK- L jaw pain is less tight- also neck tightness is also less in upper traps, levators, SCM's,  scalenes    Comments:  RUE 5-/5 LUE 0/5 throughout RLE- 4+/5 throughout- hard to get pt to do exam LLE- PF 1/5- otherwise 0/5 in LLE, sensation intact to left foot  Neurological:     Mental Status: She is alert.     Comments: Mild dysarthria with occasional wet voice. Left facial weakness with left inattention. Left HH and needs cues to look to left. A. She was able to answer basic questions, name and  place, but thought it was SUnday , not Monday   Left hemiparesis with sensory deficits.  No clonus, no hoffman's  Flaccid tone on L side  Psychiatric:     Comments: Alert and smiling   Assessment/Plan: 1. Functional deficits which require 3+ hours per day of interdisciplinary therapy in a comprehensive inpatient rehab setting. Physiatrist is providing close team supervision and 24 hour management of active medical problems listed below. Physiatrist and rehab team continue to assess barriers to discharge/monitor patient progress toward functional and medical goals  Care Tool:  Bathing    Body parts bathed by patient: Face   Body parts bathed by helper: Right arm, Left arm, Front perineal area, Buttocks, Right upper leg, Left upper leg, Right lower leg, Left lower leg, Chest, Abdomen     Bathing assist Assist Level: Total Assistance - Patient < 25%     Upper Body Dressing/Undressing Upper body dressing   What is the patient wearing?: Pull over shirt    Upper body assist Assist Level: Maximal Assistance - Patient 25 -  49%    Lower Body Dressing/Undressing Lower body dressing      What is the patient wearing?: Pants, Incontinence brief     Lower body assist Assist for lower body dressing: 2 Helpers     Toileting Toileting    Toileting assist Assist for toileting: 2 Helpers     Transfers Chair/bed transfer  Transfers assist  Chair/bed transfer activity did not occur: Safety/medical concerns (did not get up)  Chair/bed transfer assist level: Maximal Assistance - Patient 25 - 49%     Locomotion Ambulation   Ambulation assist   Ambulation activity did not occur: Safety/medical concerns  Assist level: 2 helpers Assistive device: Other (comment) (R hand rail) Max distance: 26ft   Walk 10 feet activity   Assist  Walk 10 feet activity did not occur: Safety/medical concerns        Walk 50 feet activity   Assist Walk 50 feet with 2 turns activity did  not occur: Safety/medical concerns         Walk 150 feet activity   Assist Walk 150 feet activity did not occur: Safety/medical concerns         Walk 10 feet on uneven surface  activity   Assist Walk 10 feet on uneven surfaces activity did not occur: Safety/medical concerns         Wheelchair     Assist Is the patient using a wheelchair?: Yes Type of Wheelchair: Manual    Wheelchair assist level: Dependent - Patient 0%      Wheelchair 50 feet with 2 turns activity    Assist        Assist Level: Dependent - Patient 0%   Wheelchair 150 feet activity     Assist      Assist Level: Dependent - Patient 0%   Blood pressure (!) 137/48, pulse 66, temperature 98.6 F (37 C), temperature source Oral, resp. rate 18, height 5' 1 (1.549 m), weight 66 kg, SpO2 97%.  Medical Problem List and Plan: 1. Functional deficits secondary to R MCA stroke             -patient may  shower             -ELOS/Goals: 16-20 days- min-mod A, goal 8/13           Con't CIR PT, OT and SLP  Team conference Wednesday =will determine length of stay  D3 started, increase to 2,000U, metanx also ordered Discussed with therapy initiating Hoyer lift training as patient wants to go home  Con't CIR PT, OT and SLP  CT head ordered given new-onset headache   2.  Impaired mobility: -DVT/anticoagulation:  Pharmaceutical: continue Lovenox  40mg  daily             -antiplatelet therapy: Continue ASA.  Plavix  d/ced as per neurology recommendations given hemorrhagic conversion  3. Muscle spasms in back: ccontinue lidocaine  patches. Tylenol  500mg  TID/HS, Baclofen  5mg  added. Voltaren  gel. Kpad ordered  --sedated from Flexeril  5 mg/tramadol  50 mg. Will decrease doses. Decrease prn Tramadol  to 50mg  q8H prn, d/c flexeril   Continue kpad  -d/c'd flexeril   8/3- Took lidoderm  patches off form yesterday  8/5- Denies pain this AM 8/6- L jaw pain- did myofascial release- said was much better and  could open mouth more after done. 8/7- doing better today per pt 8/8- L jaw pain- worse overnight- but better this AM   4. Hospital induced delirium at night: daughter may stay over at night -Conitnue melatonin 5mg  at 8  pm tonight. Sleep wake chart to monitor sleep              -antipsychotic agents: N/A  8/7- much more able to tell me what's going on  UA and UC reviewed and do not show significant abnormallities  5. Neuropsych/cognition: This patient may be intermittently capable of making decisions on her own behalf.  6. Daytime somnolence: modafinil  100mg  daily ordered  7. Dysphagia/dry mouth: NPO on PEG feeds 7/29, transitioned to bolus feeds, Osmolite bolus QID  continue ice chips  8/7- Pt is now on D1 nectar thick liquids and close to thin liquids per SLP- after  MBSS today  8/8- will give 300 ml bolus x1 to help flush lactic acid so trp injections work well.  8.  E coli bacteremia: Treated with 10 day course of antibiotics. Ceftriaxone -->Duricef thru 07/14. Thrombocytopenia/leucocytosis has resolved.              --Hematuria has resolved. No dysuria.   9. Chronic back/Right knee pain: SEE #3 ABOVE; Continue voltaren  gel qid to knee and back till family bring in the biofreeze (which works better) --continue Lidocaine  patches at nights to help with back spasms. Decrease flexeril  to 2.5 mg every 8 hours prn-- now d/c'd as below             --schedule tylenol  500 mg qid as LFTs resolved.   10. Abnormal LFTs: Have resolved and Lipitor 10mg  daily added on 07/19. Continue to monitor weekly for  now.   8/5- will check CMP q Monday and BMP q Thursday- will check in AM since haven't rechecked in 1 week  8/6- LFTs have normalized 11. COPD/OSA: Has CPAP but not using PTA. Continue Yupelri  nebs daily (in place of Spiriva).  --continue albuterol  every night PTA.  .  12. Hypomagnesemia: Magnesium  reviewed and has normalized  13. Anemia: Hgb reviewed and is stable  14.  Bradycardia: HR  reviewed and has normalized  15.  H/o MDD/anxiety: continue Lexapro  40mg  daily. Wellbutrin  started 75mg  BID, continue prn buspar   16. Bowel management:   Discussed that she had BM 8/11  17. R jaw/tooth pain: Completed augmentin  for potential dental infection, needs to f/up outpatient with a dentist eventually.Benzocaine  ordered -CT maxillofacial neg on 7/31 8/6- did myofascial release- pt has good results.  8/7- pt reports it's so much better- unless has to do SLP exercises- d/w SLP to see if can change up some of these exercises- see what's causing the pain? Added baclofen  prn  Trigger point injections done 07/24/24 Upper traps- B/L  Levators- B/L  Posterior scalenes- B/L  Middle scalenes Splenius Capitus Pectoralis Major- B/L  Rhomboids Infraspinatus Teres Major/minor Thoracic paraspinals Lumbar paraspinals Other injections-  Dry needled SCM upper 1/3 Also injected L masseter  18. Dry mouth: d/c flexeril     19. Hematoma on L lateral periorbital area, well circumscribed  8/3- cannot identify any trauma   8/5- Is spreading due to gravity/when sits up- educated daughter this is normal- she was a little concerned.    20. Altered mental status: CT head ordered and shows mild hemorrhagic conversion, will consult neurology, daughter and therapy updated     LOS: 21 days A FACE TO FACE EVALUATION WAS PERFORMED  Sven SQUIBB Sherrill Mck[place]ie 07/29/2024, 9:43 AM

## 2024-07-29 NOTE — Progress Notes (Signed)
 Physical Therapy Session Note  Patient Details  Name: [name] MRN: [id number] Date of Birth: [birth year]  Today's Date: 07/29/2024 PT Individual Time: 8699-8657 PT Individual Time Calculation (min): 42 min   Short Term Goals: Week 3:  PT Short Term Goal 1 (Week 3): STG = LTG  Skilled Therapeutic Interventions/Progress Updates:     Pt supine in bed finishing lunch upon arrival. Pt overall appears much more alert today in comparison to yesterday. Pt endorses feeling better today overall than yesterday. D/C extended to 8/15 2/2 medical concerns, pt and family aware.   Pt performed rolling B with max A to donn pants with total A. Pt performed supine to sit with use of bed features and max A and reciprocal scoot with total A. Pt performed squat pivot transfer bed to WC with max A +2 2/2 significant pushing to L with R UE/LE.   Pt perfomred sit to stand x5 from [facility] with use of R UE support on hallway rail and max A, verbal and tactile cues provided for midline orientation and lateral weight shift to R.   Pt seated in WC at end of session with all needs within reach and seatbelt alarm on with daughter in room at end of session.      Therapy Documentation Precautions:  Precautions Precautions: Fall Recall of Precautions/Restrictions: Impaired Precaution/Restrictions Comments: Cortrak- NPO; dense L Hemi Restrictions Weight Bearing Restrictions Per Provider Order: No  Therapy/Group: Individual Therapy  [facility] [place], [place], DPT  07/29/2024, 1:00 PM

## 2024-07-30 LAB — GLUCOSE, CAPILLARY
Glucose-Capillary: 102 mg/dL — ABNORMAL HIGH (ref 70–99)
Glucose-Capillary: 97 mg/dL (ref 70–99)
Glucose-Capillary: 113 mg/dL — ABNORMAL HIGH (ref 70–99)

## 2024-07-30 NOTE — Progress Notes (Signed)
 PROGRESS NOTE   Subjective/Complaints: Daughter is at bedside Patient has no complaints Denies headache today Jaw pain is better after receiving anti-spasmodic   ROS:   Pt denies SOB, abd pain, CP, V/C/D, and vision changes Jaw pain -intermittent, improved with antispasmodics +nausea, but better now +left sided lean +new onset headache   Per HPI Objective:     Recent Labs    07/28/24 0537  WBC 6.4  HGB 11.3*  HCT 34.0*  PLT 146*      Recent Labs    07/28/24 0537  NA 137  K 4.2  CL 102  CO2 26  GLUCOSE 99  BUN 16  CREATININE 0.57  CALCIUM  8.9       Intake/Output Summary (Last 24 hours) at 07/30/2024 0911 Last data filed at 07/29/2024 2230 Gross per 24 hour  Intake 360 ml  Output --  Net 360 ml        Physical Exam: Vital Signs Blood pressure (!) 159/69, pulse 95, temperature 97.6 F (36.4 C), temperature source Oral, resp. rate 17, height 5' 1 (1.549 m), weight 64.5 kg, SpO2 98%.      General: awake and alert; daughter at bedside;  NAD. Sitting up in w/c HENT: conjugate gaze; oropharynx little dry, small bruise to L periorbital area CV: regular rate and rhythm; no JVD Pulmonary: CTA B/L; no W/R/R- good air movement GI: soft, NT, ND, (+)BS Psychiatric: appropriate Neurological: alert, L inattention Skin: multiple bruises throughout skin esp on abd c/w lovenox  injections.   PRIOR EXAMS: MSK- L jaw pain is less tight- also neck tightness is also less in upper traps, levators, SCM's,  scalenes    Comments:  RUE 5-/5 LUE 0/5 throughout RLE- 4+/5 throughout- hard to get pt to do exam LLE- PF 1/5- otherwise 0/5 in LLE, sensation intact to left foot, stable 8/13  Neurological:     Mental Status: She is alert.     Comments: Mild dysarthria with occasional wet voice. Left facial weakness with left inattention. Left HH and needs cues to look to left. A. She was able to answer basic  questions, name and place, but thought it was SUnday , not Monday   Left hemiparesis with sensory deficits.  No clonus, no hoffman's  Flaccid tone on L side  Psychiatric:     Comments: Alert and smiling   Assessment/Plan: 1. Functional deficits which require 3+ hours per day of interdisciplinary therapy in a comprehensive inpatient rehab setting. Physiatrist is providing close team supervision and 24 hour management of active medical problems listed below. Physiatrist and rehab team continue to assess barriers to discharge/monitor patient progress toward functional and medical goals  Care Tool:  Bathing    Body parts bathed by patient: Face   Body parts bathed by helper: Right arm, Left arm, Front perineal area, Buttocks, Right upper leg, Left upper leg, Right lower leg, Left lower leg, Chest, Abdomen     Bathing assist Assist Level: Total Assistance - Patient < 25%     Upper Body Dressing/Undressing Upper body dressing   What is the patient wearing?: Pull over shirt    Upper body assist Assist Level: Maximal Assistance - Patient 25 -  49%    Lower Body Dressing/Undressing Lower body dressing      What is the patient wearing?: Pants, Incontinence brief     Lower body assist Assist for lower body dressing: 2 Helpers     Toileting Toileting    Toileting assist Assist for toileting: 2 Helpers     Transfers Chair/bed transfer  Transfers assist  Chair/bed transfer activity did not occur: Safety/medical concerns (did not get up)  Chair/bed transfer assist level: Maximal Assistance - Patient 25 - 49%     Locomotion Ambulation   Ambulation assist   Ambulation activity did not occur: Safety/medical concerns  Assist level: 2 helpers Assistive device: Other (comment) (R hand rail) Max distance: 32ft   Walk 10 feet activity   Assist  Walk 10 feet activity did not occur: Safety/medical concerns        Walk 50 feet activity   Assist Walk 50 feet with 2  turns activity did not occur: Safety/medical concerns         Walk 150 feet activity   Assist Walk 150 feet activity did not occur: Safety/medical concerns         Walk 10 feet on uneven surface  activity   Assist Walk 10 feet on uneven surfaces activity did not occur: Safety/medical concerns         Wheelchair     Assist Is the patient using a wheelchair?: Yes Type of Wheelchair: Manual    Wheelchair assist level: Dependent - Patient 0%      Wheelchair 50 feet with 2 turns activity    Assist        Assist Level: Dependent - Patient 0%   Wheelchair 150 feet activity     Assist      Assist Level: Dependent - Patient 0%   Blood pressure (!) 159/69, pulse 95, temperature 97.6 F (36.4 C), temperature source Oral, resp. rate 17, height 5' 1 (1.549 m), weight 64.5 kg, SpO2 98%.  Medical Problem List and Plan: 1. Functional deficits secondary to R MCA stroke             -patient may  shower             -ELOS/Goals: 16-20 days- min-mod A, goal 8/13           Con't CIR PT, OT and SLP  Team conference Wednesday =will determine length of stay  D3 started, increase to 2,000U, metanx also ordered Discussed with therapy initiating Hoyer lift training as patient wants to go home  Con't CIR PT, OT and SLP  CT head ordered given new-onset headache  Grounds pass ordered  2.  Impaired mobility: -DVT/anticoagulation:  Pharmaceutical: continue Lovenox  40mg  daily             -antiplatelet therapy: Continue ASA.  Plavix  d/ced as per neurology recommendations given hemorrhagic conversion  3. Muscle spasms in back: ccontinue lidocaine  patches. Tylenol  500mg  TID/HS, Baclofen  5mg  added. Voltaren  gel. Kpad ordered  --sedated from Flexeril  5 mg/tramadol  50 mg. Will decrease doses. Decrease prn Tramadol  to 50mg  q8H prn, d/c flexeril   continue kpad  4. Hospital induced delirium at night: daughter may stay over at night -continue melatonin 5mg  at 8 pm tonight.  Sleep wake chart to monitor sleep              -antipsychotic agents: N/A  8/7- much more able to tell me what's going on  UA and UC reviewed and do not show significant abnormallities  5. Neuropsych/cognition:  This patient may be intermittently capable of making decisions on her own behalf.  6. Daytime somnolence: modafinil  100mg  daily ordered  7. Dysphagia/dry mouth: NPO on PEG feeds 7/29, transitioned to bolus feeds, Osmolite bolus QID  continue ice chips  8/7- Pt is now on D1 nectar thick liquids and close to thin liquids per SLP- after  MBSS today  8/8- will give 300 ml bolus x1 to help flush lactic acid so trp injections work well.  8.  E coli bacteremia: Treated with 10 day course of antibiotics. Ceftriaxone -->Duricef thru 07/14. Thrombocytopenia/leucocytosis has resolved.              --Hematuria has resolved. No dysuria.   9. Chronic back/Right knee pain: SEE #3 ABOVE; Continue voltaren  gel qid to knee and back till family bring in the biofreeze (which works better) --continue Lidocaine  patches at nights to help with back spasms. Decrease flexeril  to 2.5 mg every 8 hours prn-- now d/c'd as below             --schedule tylenol  500 mg qid as LFTs resolved.   10. Abnormal LFTs: Have resolved and Lipitor 10mg  daily added on 07/19. Continue to monitor weekly for  now.   8/5- will check CMP q Monday and BMP q Thursday- will check in AM since haven't rechecked in 1 week  8/6- LFTs have normalized 11. COPD/OSA: Has CPAP but not using PTA. Continue Yupelri  nebs daily (in place of Spiriva).  --continue albuterol  every night PTA.  .  12. Hypomagnesemia: Magnesium  reviewed and has normalized  13. Anemia: Hgb reviewed and is stable  14.  Bradycardia: HR reviewed and has normalized  15.  H/o MDD/anxiety: continue Lexapro  40mg  daily. Wellbutrin  started 75mg  BID, continue prn buspar   16. Bowel management:   Discussed that she had BM 8/11  17. R jaw/tooth pain: Completed augmentin  for  potential dental infection, needs to f/up outpatient with a dentist eventually.Benzocaine  ordered -CT maxillofacial neg on 7/31 8/6- did myofascial release- pt has good results.  8/7- pt reports it's so much better- unless has to do SLP exercises- d/w SLP to see if can change up some of these exercises- see what's causing the pain? Added baclofen  prn  S/p trigger point injections  18. Dry mouth: d/c flexeril     19. Hematoma on L lateral periorbital area, well circumscribed  8/3- cannot identify any trauma   8/5- Is spreading due to gravity/when sits up- educated daughter this is normal- she was a little concerned.    20. Altered mental status: CT head ordered and shows mild hemorrhagic conversion, neurology consulted, plavix  d/ced     LOS: 22 days A FACE TO FACE EVALUATION WAS PERFORMED  Payton Prinsen P Wally Behan 07/30/2024, 9:11 AM

## 2024-07-30 NOTE — Progress Notes (Signed)
 Physical Therapy Session Note  Patient Details  Name: [name] MRN: [id number] Date of Birth: [birth year]  Today's Date: 07/30/2024 PT Individual Time: 1015-1055 PT Individual Time Calculation (min): 40 min   Short Term Goals: Week 3:  PT Short Term Goal 1 (Week 3): STG = LTG  Skilled Therapeutic Interventions/Progress Updates:       Pt greeted in bed with her daughter present at the bedside. Patient continues to report L jaw pain and is distracted by this during the session. Pain is unrated - mobility, distraction, and repositioning provided for pain management.   Allowed ++ time for initiation during session to reduce learned helplessness behavior. Needs more than a reasonable amount of time to initiate and max cues for this as well. Supine<>sitting with hospital bed features with max/totalA. She continues to push L and has poor awareness and initiation to achieve upright/midline. Spent time working on [employer] for sitting balance, elbow leans, and cueing to facilitate upright. Continued to require maxA for sitting balance. Noted smell of urine while she sat EOB - assisted her to the bathroom via Stedy - needs mod/maxA with +2 for [facility] for for safety. Patient assisted onto the toilet - requires minA to start for sitting balance on toilet due to L lean, progresses to maxA when fatigued. Patient continent of bladder void, she felt urge to have BM but unable to produce. Patient needing +2 maxA for standing and for standing balance via Stedy after toileting. Transported to wheelchair and left in wheelchair (tilted), seat belt alarm on, and all needs met.      Therapy Documentation Precautions:  Precautions Precautions: Fall Recall of Precautions/Restrictions: Impaired Precaution/Restrictions Comments: Cortrak- NPO; dense L Hemi Restrictions Weight Bearing Restrictions Per Provider Order: No General:     Therapy/Group: Individual Therapy  [name] 07/30/2024,  7:42 AM

## 2024-07-30 NOTE — Progress Notes (Signed)
 Occupational Therapy Session Note  Patient Details  Name: [name] MRN: [id number] Date of Birth: 05/19/1932  Today's Date: 07/30/2024 OT Individual Time: 1400-1453 OT Individual Time Calculation (min): 53 min    Short Term Goals: Week 3:  OT Short Term Goal 1 (Week 3): Patient will sit at edge of bed for 2-3 seconds using compensatory strategies and without external support. OT Short Term Goal 2 (Week 3): Patient will actively lean forward in midline while sitting to prepare for sit to stand OT Short Term Goal 3 (Week 3): STG=LTG due to LOS  Skilled Therapeutic Interventions/Progress Updates:   Patient received seated in wheelchair, agreeable to OT session.  [name], [name], at bedside.  Patient indicated no need to void, so transported to gym to address postural control.  In setting up for transfer - patient with significant left upper trunk bias.  Worked on leaning down onto forearm with RUE to allow wheelchair management.  Transferred via stand pivot with max assist toward right side.  Worked on seated postural control and static to quasi-dynamic sitting balance.  Worked on leaning down onto right forearm, reaching outside of base of support toward right then returning slowly and carefully to midline.  Patient able to sit for several minutes without more than contact guard intermittently!  Patient needing increased time and mod cueing to locate wheelchair place left of midline.  Patient transported back to room and into recliner at end of session, per her request.  Daughter at bedside and personal items in reach.    Therapy Documentation Precautions:  Precautions Precautions: Fall Recall of Precautions/Restrictions: Impaired Precaution/Restrictions Comments: Cortrak- NPO; dense L Hemi Restrictions Weight Bearing Restrictions Per Provider Order: No  Pain:  Denies pain - at times indicates jaw is starting to hurt - but no real pain this session   Therapy/Group:  Individual Therapy  [name] 07/30/2024, 3:05 PM

## 2024-07-30 NOTE — Progress Notes (Signed)
 Speech Language Pathology Daily Session Note  Patient Details  Name: [name] MRN: [id number] Date of Birth: 1932-01-13  Today's Date: 07/30/2024 SLP Individual Time: 1125-1236 SLP Individual Time Calculation (min): 71 min  Short Term Goals: Week 3: SLP Short Term Goal 1 (Week 3): STGs=LTGs d/t ELOS  Skilled Therapeutic Interventions: Skilled therapy session focused on family education, dysphagia and cognition. SLP targeted family education by reviewing thickening with family members and providing hands on demonstration. SLP then reviewed D1 diet recommendation and examples of foods to offer at d/c. SLP provided handout and verbal explaination of water  protocol. Patients family verbalized understanding of education provided. SLP targeted dysphagia goals through observing patient with upgraded D2/NTL lunch tray. Patient reporting increased difficulty and oral residuals with upgraded solids. Noted increased s/sx of aspiration this date despite consistency. Recommend continuation of D1/NTL with FULL supervision. SLP targeted cognitive goals through orientation and L attention task. patient independently oriented to self, situation, location and time. Patient independently attended to the L today when daughter walked in! This is a significant improvement from prior. Patient left in chair with alarm set and call bell in reach. Continue POC.   Pain None reported   Therapy/Group: Individual Therapy Melissia Lahman M.A., CCC-SLP 07/30/2024, 7:40 AM

## 2024-07-30 NOTE — Progress Notes (Signed)
 Speech Language Pathology Daily Session Note  Patient Details  Name: [name] MRN: [id number] Date of Birth: 11-17-1932  Today's Date: 07/30/2024 SLP Individual Time: 9199-9154 SLP Individual Time Calculation (min): 45 min  Short Term Goals: Week 3: SLP Short Term Goal 1 (Week 3): STGs=LTGs d/t ELOS  Skilled Therapeutic Interventions:  Patient was seen in am to address dysphagia management and cognitive re- training. Pt alert and seen at bedside upon SLP arrival. NSG staff present and administering medication. Pt reported jaw pain though remained agreeable for breakfast consumption. She tolerated meds crushed in puree and sips of NTL. She reported urinary incontinence and need to get cleaned up. Pt able to follow single step directions during pericare. SLP provided set up A for breakfast meal. Pt requesting to delay oral hygiene due to jaw pain but wanting to consume breakfast meal. She consumed a few bites of D1 textures and nectar liquids via straw which she administered. Pt with no s/s aspiration. After meal SLP provided set up A for oral hygiene and mod A to don dentures. Cognition addressed intermittently throughout session. Pt oriented to approximate date and day of week. After review of information, she recalled temporal concepts for remainder of session. Pt also challenged to recall therapy schedule after review. She recalled amount of ST sessions and session following this given min A. Pt also challenged in L inattention warranting min to mod A to identify objects within L visual field. At conclusion of session pt was left upright in bed with call button within reach, bed alarm active, and dtr present. SLP to continue POC.   Pain Pain Assessment Pain Scale: 0-10 Pain Score: 0-No pain Pain Location: Jaw Pain Intervention(s): Medication (See eMAR)  Therapy/Group: Individual Therapy  Joane GORMAN Fuss 07/30/2024, 8:40 AM

## 2024-07-30 NOTE — Progress Notes (Signed)
 Patient ID: [name], female   DOB: [birth year], 89 y.o.   MRN: [id number]  Met with pt and two daughter's to give team conference update regarding progress this week and plan for discharge Friday. Daughter has received all equipment except tube feedings, which have been shipped. Have made referral to [facility] for follow up therapies along with nursing and an aide. Pt will need to go home via non-emergency ambulance. Work toward discharge Friday.

## 2024-07-30 NOTE — Patient Care Conference (Signed)
 Inpatient RehabilitationTeam Conference and Plan of Care Update Date: 07/30/2024   Time: 11:08 AM    Patient Name: [name]      Medical Record Number: [id number]  Date of Birth: [birth year] Sex: Female         Room/Bed: 4W26C/4W26C-01 Payor Info: Payor: [employer] MEDICARE / Plan: [employer] MEDICARE CHOICE PPO / Product Type: *No Product type* /    Admit Date/Time:  07/08/2024  3:21 PM  Primary Diagnosis:  Acute ischemic right MCA stroke [facility])  Hospital Problems: Principal Problem:   Acute ischemic right MCA stroke [facility]) Active Problems:   Moderate malnutrition [facility])    Expected Discharge Date: Expected Discharge Date: 08/01/24  Team Members Present: Physician leading conference: Dr. Sven Elks Social Worker Present: Rhoda Clement, LCSW Nurse Present: Barnie Ronde, RN PT Present: Sherlean Perks, PT OT Present: Monica Peacock, OT SLP Present: [name], SLP PPS Coordinator present : Eleanor Colon, SLP     Current Status/Progress Goal Weekly Team Focus  Bowel/Bladder   incontinent of bladder and bowel. last bm 8/11   maintain skin integrity   assess skin q shift and prn, provide personal care as needed    Swallow/Nutrition/ Hydration   D2/NTL   modA  pharyngeal strengthening exercises, PO trials, EMST    ADL's   Total assist lower body, max/mod assist upper body,   Down grade goals to max   discharge planning, sitting balance, safety, ongoing training with family    Mobility   maxA bed mobility, maxA sitting balance, maxA squat pivot transfers, dependent for w/c mobility   Have been downgraded to maxA  DC planning, family/caregiver training, w/c consult completed    Communication                Safety/Cognition/ Behavioral Observations  modA for L attention, min-modA for STM and problem soving   minA   short term memory strategies, orientation w/ external aids, basic problem solving, L attention    Pain   c/o pain to left jaw intermittently    maintain pain level < 3   assess for pain q shift and prn, continue comfort measures, prn and scheduled pain management medication    Skin   feeding tube site and areas of scattered ecchymosis   mantain skin integrity  assess and provide treatment to feeding tube site      Discharge Planning:  [name]-daughter has been here and participated in Mom's care with plans to take home with 24/7 care. Has hired caregivers and DME delivered and [facility] in place.  Will need to go home via non-emergency ambulance transport. Palliative care involved also   Team Discussion: Patient was admitted post right MCA stroke. Patient with UTI/ back pain/right Jaw pain: medication adjusted by MD and myofacial release. Patient is NPO w/ peg tube. Progress limited by poor endurance, poor awareness/initiation and activity intolerance. Also affected by hemi-conversion on 07/28/24 with headaches; anti-coag therapy stopped per MD.  Patient on target to meet rehab goals: no, currently needs total assist for lower body care and mod - max assist for upper body care; completing bathing and dressing at the bed level. Needs max - total assist for bed mobility and max assist to sit edge of the bed. Requires max assist - total assist for squat pivot transfers; unable to ambulate.  Goals for discharge were downgraded.  *See Care Plan and progress notes for long and short-term goals.   Revisions to Treatment Plan:  W/C consult EMST; pharyngeal exercises  Teaching Needs: Safety, skin care, medications, PEG care/ administration of medications/free H20 via peg, transfers, toileting, etc.  Current Barriers to Discharge: Home enviroment access/layout, Incontinence, Lack of/limited family support, and Nutritional means  Possible Resolutions to Barriers: Family education completed Ramp for entry to home DME: W/C, hoyer, hospital bed Non-emergent transportation to the home Dtr given information for private duty care givers      Medical Summary Current Status: dysphagia, HTN, left jaw pain, hemorrhagic conversion of CVA  Barriers to Discharge: Medical stability  Barriers to Discharge Comments: dysphagia, HTN, left jaw pain, hemorrhagic conversion of CVA Possible Resolutions to [employer] Focus: advance to D2 diet, discussed nutriitous D2.nectar thick food options, continue to monitor BP TID, continue baclofen  prn, continue tylenol  scheduled, d/c plavix , continue aspirin    Continued Need for Acute Rehabilitation Level of Care: The patient requires daily medical management by a physician with specialized training in physical medicine and rehabilitation for the following reasons: Direction of a multidisciplinary physical rehabilitation program to maximize functional independence : Yes Medical management of patient stability for increased activity during participation in an intensive rehabilitation regime.: Yes Analysis of laboratory values and/or radiology reports with any subsequent need for medication adjustment and/or medical intervention. : Yes   I attest that I was present, lead the team conference, and concur with the assessment and plan of the team.   Fredericka Sober B 07/30/2024, 2:38 PM

## 2024-07-31 ENCOUNTER — Other Ambulatory Visit (HOSPITAL_COMMUNITY): Payer: Self-pay

## 2024-07-31 LAB — CBC
HCT: 36.8 % (ref 36.0–46.0)
Hemoglobin: 12.1 g/dL (ref 12.0–15.0)
MCH: 31.9 pg (ref 26.0–34.0)
MCHC: 32.9 g/dL (ref 30.0–36.0)
MCV: 97.1 fL (ref 80.0–100.0)
Platelets: 156 K/uL (ref 150–400)
RBC: 3.79 MIL/uL — ABNORMAL LOW (ref 3.87–5.11)
RDW: 14.8 % (ref 11.5–15.5)
WBC: 6.5 K/uL (ref 4.0–10.5)
nRBC: 0 % (ref 0.0–0.2)

## 2024-07-31 LAB — BASIC METABOLIC PANEL WITH GFR
Anion gap: 9 (ref 5–15)
BUN: 16 mg/dL (ref 8–23)
CO2: 26 mmol/L (ref 22–32)
Calcium: 9.2 mg/dL (ref 8.9–10.3)
Chloride: 103 mmol/L (ref 98–111)
Creatinine, Ser: 0.61 mg/dL (ref 0.44–1.00)
GFR, Estimated: >60 mL/min (ref >60)
Glucose, Bld: 81 mg/dL (ref 70–99)
Potassium: 3.9 mmol/L (ref 3.5–5.1)
Sodium: 138 mmol/L (ref 135–145)

## 2024-07-31 LAB — GLUCOSE, CAPILLARY
Glucose-Capillary: 93 mg/dL (ref 70–99)
Glucose-Capillary: 131 mg/dL — ABNORMAL HIGH (ref 70–99)

## 2024-07-31 MED ORDER — LIDOCAINE 5 % EX PTCH
2.0000 | MEDICATED_PATCH | CUTANEOUS | 0 refills | Status: DC
  Filled 2024-07-31: qty 60, 30d supply, fill #0

## 2024-07-31 MED ORDER — BUSPIRONE HCL 5 MG PO TABS
5.0000 mg | ORAL_TABLET | Freq: Three times a day (TID) | ORAL | 0 refills | Status: AC | PRN
  Filled 2024-07-31: qty 30, 10d supply, fill #0

## 2024-07-31 MED ORDER — L-METHYLFOLATE-B6-B12 3-35-2 MG PO TABS
1.0000 | ORAL_TABLET | Freq: Two times a day (BID) | ORAL | 0 refills | Status: DC
  Filled 2024-07-31: qty 60, 30d supply, fill #0

## 2024-07-31 MED ORDER — BUPROPION HCL 75 MG PO TABS
75.0000 mg | ORAL_TABLET | Freq: Two times a day (BID) | ORAL | 0 refills | Status: AC
  Filled 2024-07-31: qty 60, 30d supply, fill #0

## 2024-07-31 MED ORDER — ORAL CARE MOUTH RINSE: 15.0000 mL | OROMUCOSAL | Status: DC | PRN

## 2024-07-31 MED ORDER — OSMOLITE 1.5 CAL PO LIQD
237.0000 mL | Freq: Two times a day (BID) | ORAL | Status: DC
  Administered 2024-07-31 – 2024-08-01 (×2): 237 mL
  Filled 2024-07-31 (×2): qty 237

## 2024-07-31 MED ORDER — CAMPHOR-MENTHOL 0.5-0.5 % EX LOTN
TOPICAL_LOTION | CUTANEOUS | 0 refills | Status: DC | PRN
  Filled 2024-07-31: qty 222, fill #0

## 2024-07-31 MED ORDER — MENTHOL (TOPICAL ANALGESIC) 4 % EX GEL: 1.0000 | Freq: Four times a day (QID) | CUTANEOUS | Status: DC

## 2024-07-31 MED ORDER — DICLOFENAC SODIUM 1 % EX GEL
2.0000 g | Freq: Four times a day (QID) | CUTANEOUS | 0 refills | Status: AC
Start: 2024-07-31 — End: ?
  Filled 2024-07-31: qty 100, 13d supply, fill #0

## 2024-07-31 MED ORDER — MELATONIN 5 MG PO TABS: 5.0000 mg | ORAL_TABLET | Freq: Every day | ORAL | 0 refills | Status: AC

## 2024-07-31 MED ORDER — BENZOCAINE 10 % MT GEL
Freq: Two times a day (BID) | OROMUCOSAL | 0 refills | Status: DC | PRN
  Filled 2024-07-31: qty 5.3, fill #0

## 2024-07-31 MED ORDER — MODAFINIL 100 MG PO TABS
100.0000 mg | ORAL_TABLET | Freq: Every day | ORAL | 0 refills | Status: DC
  Filled 2024-07-31: qty 30, 30d supply, fill #0

## 2024-07-31 MED ORDER — ATORVASTATIN CALCIUM 10 MG PO TABS
10.0000 mg | ORAL_TABLET | Freq: Every day | ORAL | 0 refills | Status: AC
  Filled 2024-07-31: qty 30, 30d supply, fill #0

## 2024-07-31 MED ORDER — BACLOFEN 5 MG PO TABS
5.0000 mg | ORAL_TABLET | Freq: Every day | ORAL | 0 refills | Status: DC
Start: 2024-07-31 — End: 2024-08-07
  Filled 2024-07-31: qty 30, 30d supply, fill #0

## 2024-07-31 MED ORDER — VITAMIN D 50 MCG (2000 UT) PO TABS: 2000.0000 [IU] | ORAL_TABLET | Freq: Every day | ORAL | Status: AC

## 2024-07-31 MED ORDER — FREE WATER: 120.0000 mL | Freq: Three times a day (TID) | Status: AC

## 2024-07-31 MED ORDER — ACETAMINOPHEN ER 650 MG PO TBCR: 650.0000 mg | EXTENDED_RELEASE_TABLET | Freq: Three times a day (TID) | ORAL | Status: AC

## 2024-07-31 MED ORDER — ESCITALOPRAM OXALATE 20 MG PO TABS
40.0000 mg | ORAL_TABLET | Freq: Every day | ORAL | 0 refills | Status: AC
  Filled 2024-07-31: qty 30, 15d supply, fill #0

## 2024-07-31 MED ORDER — SENNA 8.6 MG PO TABS
1.0000 | ORAL_TABLET | Freq: Every day | ORAL | 0 refills | Status: DC
  Filled 2024-07-31: qty 120, 120d supply, fill #0

## 2024-07-31 MED ORDER — OSMOLITE 1.5 CAL PO LIQD: 237.0000 mL | Freq: Two times a day (BID) | ORAL | Status: AC

## 2024-07-31 MED ORDER — TRAMADOL HCL 50 MG PO TABS
25.0000 mg | ORAL_TABLET | ORAL | 0 refills | Status: AC | PRN
  Filled 2024-07-31: qty 35, 6d supply, fill #0

## 2024-07-31 NOTE — Plan of Care (Signed)
  Problem: RH Swallowing Goal: LTG Patient will consume least restrictive diet using compensatory strategies with assistance (SLP) Description: LTG:  Patient will consume least restrictive diet using compensatory strategies with assistance (SLP) Outcome: Completed/Met   Problem: RH Problem Solving Goal: LTG Patient will demonstrate problem solving for (SLP) Description: LTG:  Patient will demonstrate problem solving for basic/complex daily situations with cues  (SLP) Outcome: Completed/Met   Problem: RH Memory Goal: LTG Patient will use memory compensatory aids to (SLP) Description: LTG:  Patient will use memory compensatory aids to recall biographical/new, daily complex information with cues (SLP) Outcome: Completed/Met   Problem: RH Swallowing Goal: LTG Patient will consume least restrictive diet using compensatory strategies with assistance (SLP) Description: LTG:  Patient will consume least restrictive diet using compensatory strategies with assistance (SLP) Outcome: Completed/Met   Problem: RH Problem Solving Goal: LTG Patient will demonstrate problem solving for (SLP) Description: LTG:  Patient will demonstrate problem solving for basic/complex daily situations with cues  (SLP) Outcome: Completed/Met   Problem: RH Memory Goal: LTG Patient will use memory compensatory aids to (SLP) Description: LTG:  Patient will use memory compensatory aids to recall biographical/new, daily complex information with cues (SLP) Outcome: Completed/Met

## 2024-07-31 NOTE — Progress Notes (Signed)
 Occupational Therapy Discharge Summary  Patient Details  Name: [name] MRN: [id number] Date of Birth: 04-Apr-1932  Date of Discharge from OT service:July 31, 2024  Today's Date: 07/31/2024 Treatment time:  [id number] Treatment minutes:  42 min   Skilled Therapeutic Intervention: Patient received seated in recliner fully dressed just finished with lunch.  Daughter [name] leaving as OT entered the room.  Patient declined BADL- she is already bathed and dressed, and has no toileting needs.  Patient declined going to the gym, opting instead to stay comfortably positioned in recliner chair.  Patient asking about LUE splint application.  Splint can be worn if comfortable at night.  Splint is not required, but can be helpful to support left hand to combat tightness if tension in hand increases.  Reviewed plans for discharge.  Patient indicates they will try this on a week by week basis, and if she feels it is too much for her family to manage, she will allow them to pursue alternate options.  Patient left up in recliner at end f session with personal care items and call bell in reach.    Patient has met 8 of 12 long term goals due to improved activity tolerance, improved balance, ability to compensate for deficits, improved attention, improved awareness, and improved coordination.  Patient to discharge at [facility] Max Assist level.  Patient's care partner is independent to provide the necessary physical and cognitive assistance at discharge.    Reasons goals not met: Patient with medical setback which limited cognitive and perceptual improvement  Recommendation:  Patient will benefit from ongoing skilled OT services in home health setting to continue to advance functional skills in the area of BADL and Reduce care partner burden.  Equipment: Drop arm commode, custom wheelchair, hospital bed, hoyer lift  Reasons for discharge: discharge from hospital  Patient/family agrees with progress made  and goals achieved: Yes  OT Discharge Precautions/Restrictions  Precautions Precautions: Fall Recall of Precautions/Restrictions: Impaired Precaution/Restrictions Comments: PEG, incontinence, dense L hemi, Pusher, jaw pain Restrictions Weight Bearing Restrictions Per Provider Order: No  Pain Pain Assessment Pain Scale: Faces Faces Pain Scale: Hurts a little bit Pain Location: Generalized Pain Intervention(s): Pain med given for lower pain score than stated, per patient request ADL ADL Eating: Moderate assistance Where Assessed-Eating: Chair Grooming: Moderate assistance Where Assessed-Grooming: Sitting at sink Upper Body Bathing: Moderate assistance Where Assessed-Upper Body Bathing: Sitting at sink Lower Body Bathing: Maximal assistance Where Assessed-Lower Body Bathing: Bed level Upper Body Dressing: Moderate assistance Where Assessed-Upper Body Dressing: Sitting at sink Lower Body Dressing: Maximal assistance Where Assessed-Lower Body Dressing: Bed level Toileting: Maximal assistance Where Assessed-Toileting: Teacher, adult education: Maximal Dentist Method: Surveyor, minerals: Gaffer: Unable to assess Tub/Shower Transfer Method: Unable to assess Film/video editor: Unable to assess [employer] Method: Unable to assess ADL Comments: had assistance for IADL prior to hospitalization Vision Baseline Vision/History: 1 Wears glasses Patient Visual Report: Eye fatigue/eye pain/headache;Blurring of vision Vision Assessment?: Wears glasses for reading;Yes Eye Alignment: Impaired (comment) Ocular Range of Motion: Restricted on the left Alignment/Gaze Preference: Gaze right Tracking/Visual Pursuits: Left eye does not track laterally Convergence: Impaired (comment) Visual Fields: Left visual field deficit Additional Comments: Macular degeneration in both eyes.  R gaze preference - but does  scan left with time/ aud cueing Perception  Perception: Impaired Praxis Praxis: Impaired Praxis Impairment Details: Initiation;Motor planning;Organization Cognition Cognition Overall Cognitive Status: Impaired/Different from baseline Arousal/Alertness: Awake/alert Orientation Level: Person;Place;Situation Person:  Oriented Place: Oriented Situation: Oriented Memory Impairment: Decreased short term memory;Retrieval deficit Decreased Short Term Memory: Functional basic Attention: Sustained Sustained Attention: Impaired Awareness Impairment: Emergent impairment Problem Solving: Impaired Problem Solving Impairment: Functional basic Executive Function: Initiating;Organizing;Decision Making Organizing: Impaired Organizing Impairment: Functional basic Initiating: Impaired Initiating Impairment: Functional basic Safety/Judgment: Impaired Brief Interview for Mental Status (BIMS) Repetition of Three Words (First Attempt): 3 Temporal Orientation: Year: Correct Temporal Orientation: Month: Accurate within 5 days Temporal Orientation: Day: Incorrect Recall: Sock: Yes, no cue required Recall: Blue: Yes, no cue required Recall: Bed: Yes, after cueing (a piece of furniture) BIMS Summary Score: 13 Sensation Sensation Light Touch: Impaired by gross assessment Central sensation comments: Significant somatosensory loss LUE Proprioception: Impaired by gross assessment Proprioception Impaired Details: Impaired LUE;Impaired LLE Stereognosis: Not tested Coordination Gross Motor Movements are Fluid and Coordinated: No Fine Motor Movements are Fluid and Coordinated: No Coordination and Movement Description: Dense L hemi Motor  Motor Motor: Hemiplegia;Abnormal postural alignment and control;Motor impersistence;Abnormal tone Mobility  Bed Mobility Bed Mobility: Rolling Left;Rolling Right;Sit to Supine;Supine to Sit Rolling Right: Maximal Assistance - Patient 25-49% Rolling Left:  Minimal Assistance - Patient > 75% Left Sidelying to Sit: Dependent - Patient equal 0% Supine to Sit: Maximal Assistance - Patient - Patient 25-49% Sit to Supine: Maximal Assistance - Patient 25-49% Transfers Sit to Stand: Moderate Assistance - Patient 50-74% Stand to Sit: Moderate Assistance - Patient 50-74%  Trunk/Postural Assessment  Cervical Assessment Cervical Assessment: Exceptions to [facility] Thoracic Assessment Thoracic Assessment: Exceptions to [facility] Lumbar Assessment Lumbar Assessment: Exceptions to [facility] Postural Control Postural Control: Deficits on evaluation Trunk Control: pushes left and posterior Righting Reactions: significantly delayed  Balance Balance Balance Assessed: Yes Static Sitting Balance Static Sitting - Balance Support: Feet supported;No upper extremity supported Static Sitting - Level of Assistance: 2: Max assist Dynamic Sitting Balance Dynamic Sitting - Balance Support: Feet supported;No upper extremity supported Dynamic Sitting - Level of Assistance: 1: +1 Total assist Dynamic Sitting Balance - Compensations: pushes L and posterior Static Standing Balance Static Standing - Balance Support: Right upper extremity supported;During functional activity Static Standing - Level of Assistance: 2: Max assist Extremity/Trunk Assessment RUE Assessment RUE Assessment: Within Functional Limits LUE Assessment LUE Assessment: Exceptions to [facility] Active Range of Motion (AROM) Comments: No volitional movement noted - dense left hemiplegia LUE Body System: Neuro Brunstrum levels for arm and hand: Arm;Hand Brunstrum level for arm: Stage I Presynergy Brunstrum level for hand: Stage I Flaccidity   Kynleigh Artz M 07/31/2024, 2:49 PM

## 2024-07-31 NOTE — Progress Notes (Signed)
 PROGRESS NOTE   Subjective/Complaints: No new complaints this morning Jaw pain is not present this morning but she feels it can come on suddenly later in the day   ROS:   Pt denies SOB, abd pain, CP, V/C/D, and vision changes Jaw pain -intermittent, improved with antispasmodics +nausea, but better now +left sided lean +new onset headache- resolved   Per HPI Objective:     Recent Labs    07/31/24 0426  WBC 6.5  HGB 12.1  HCT 36.8  PLT 156      Recent Labs    07/31/24 0426  NA 138  K 3.9  CL 103  CO2 26  GLUCOSE 81  BUN 16  CREATININE 0.61  CALCIUM  9.2       Intake/Output Summary (Last 24 hours) at 07/31/2024 1334 Last data filed at 07/31/2024 1311 Gross per 24 hour  Intake 800 ml  Output --  Net 800 ml        Physical Exam: Vital Signs Blood pressure 130/61, pulse 79, temperature 98.3 F (36.8 C), resp. rate 16, height 5' 1 (1.549 m), weight 63.3 kg, SpO2 94%.      General: awake and alert; daughter at bedside;  NAD. Sitting up in w/c HENT: conjugate gaze; oropharynx little dry, small bruise to L periorbital area CV: regular rate and rhythm; no JVD Pulmonary: CTA B/L; no W/R/R- good air movement GI: soft, NT, ND, (+)BS Psychiatric: appropriate Neurological: alert, L inattention Skin: multiple bruises throughout skin esp on abd c/w lovenox  injections.   PRIOR EXAMS: MSK- L jaw pain is less tight- also neck tightness is also less in upper traps, levators, SCM's,  scalenes    Comments:  RUE 5-/5 LUE 0/5 throughout RLE- 4+/5 throughout- hard to get pt to do exam LLE- PF 1/5- otherwise 0/5 in LLE, sensation intact to left foot, stable 8/14  Neurological:     Mental Status: She is alert.     Comments: Mild dysarthria with occasional wet voice. Left facial weakness with left inattention. Left HH and needs cues to look to left. A. She was able to answer basic questions, name and  place, but thought it was SUnday , not Monday   Left hemiparesis with sensory deficits.  No clonus, no hoffman's  Flaccid tone on L side  Psychiatric:     Comments: Alert and smiling   Assessment/Plan: 1. Functional deficits which require 3+ hours per day of interdisciplinary therapy in a comprehensive inpatient rehab setting. Physiatrist is providing close team supervision and 24 hour management of active medical problems listed below. Physiatrist and rehab team continue to assess barriers to discharge/monitor patient progress toward functional and medical goals  Care Tool:  Bathing    Body parts bathed by patient: Left arm, Chest, Abdomen, Right upper leg, Left upper leg, Face   Body parts bathed by helper: Right arm, Buttocks, Front perineal area, Left lower leg, Right lower leg     Bathing assist Assist Level: Maximal Assistance - Patient 24 - 49%     Upper Body Dressing/Undressing Upper body dressing   What is the patient wearing?: Pull over shirt    Upper body assist Assist Level: Moderate  Assistance - Patient 50 - 74%    Lower Body Dressing/Undressing Lower body dressing      What is the patient wearing?: Pants, Incontinence brief     Lower body assist Assist for lower body dressing: Maximal Assistance - Patient 25 - 49%     Toileting Toileting Toileting Activity did not occur (Clothing management and hygiene only): N/A (no void or bm)  Toileting assist Assist for toileting: 2 Helpers     Transfers Chair/bed transfer  Transfers assist  Chair/bed transfer activity did not occur: Safety/medical concerns (did not get up)  Chair/bed transfer assist level: Maximal Assistance - Patient 25 - 49%     Locomotion Ambulation   Ambulation assist   Ambulation activity did not occur: Safety/medical concerns  Assist level: 2 helpers Assistive device: Other (comment) (R  hand rail) Max distance: 32ft   Walk 10 feet activity   Assist  Walk 10 feet activity  did not occur: Safety/medical concerns        Walk 50 feet activity   Assist Walk 50 feet with 2 turns activity did not occur: Safety/medical concerns         Walk 150 feet activity   Assist Walk 150 feet activity did not occur: Safety/medical concerns         Walk 10 feet on uneven surface  activity   Assist Walk 10 feet on uneven surfaces activity did not occur: Safety/medical concerns         Wheelchair     Assist Is the patient using a wheelchair?: Yes Type of Wheelchair: Manual    Wheelchair assist level: Dependent - Patient 0% Max wheelchair distance: 0'    Wheelchair 50 feet with 2 turns activity    Assist        Assist Level: Dependent - Patient 0%   Wheelchair 150 feet activity     Assist      Assist Level: Dependent - Patient 0%   Blood pressure 130/61, pulse 79, temperature 98.3 F (36.8 C), resp. rate 16, height 5' 1 (1.549 m), weight 63.3 kg, SpO2 94%.  Medical Problem List and Plan: 1. Functional deficits secondary to R MCA stroke             -patient may  shower             -ELOS/Goals: 16-20 days- min-mod A, goal 8/13           Con't CIR PT, OT and SLP  Team conference Wednesday =will determine length of stay  D3 started, increase to 2,000U, metanx also ordered Discussed with therapy initiating Hoyer lift training as patient wants to go home  Continue CIR  Grounds pass ordered  2.  Impaired mobility: -DVT/anticoagulation:  Pharmaceutical: continue Lovenox  40mg  daily             -antiplatelet therapy: Continue ASA.  Plavix  d/ced as per neurology recommendations given hemorrhagic conversion  3. Muscle spasms in back: ccontinue lidocaine  patches. Tylenol  500mg  TID/HS, Baclofen  5mg  added. Voltaren  gel. Kpad ordered  --sedated from Flexeril  5 mg/tramadol  50 mg. Will decrease doses. Decrease prn Tramadol  to 50mg  q8H prn, d/c flexeril   Continue kpad  4. Hospital induced delirium at night: daughter may stay over at  night -continue melatonin 5mg  at 8 pm tonight. Sleep wake chart to monitor sleep              -antipsychotic agents: N/A  8/7- much more able to tell me what's going on  UA and  UC reviewed and do not show significant abnormallities  5. Neuropsych/cognition: This patient may be intermittently capable of making decisions on her own behalf.  6. Daytime somnolence: continue modafinil  100mg  daily ordered  7. Dysphagia/dry mouth: NPO on PEG feeds 7/29, transitioned to bolus feeds, Osmolite bolus QID  continue ice chips  Pt is now on D2 nectar thick liquids and close to thin liquids per SLP- after    Decrease TF to BID 8.  E coli bacteremia: Treated with 10 day course of antibiotics. Ceftriaxone -->Duricef thru 07/14. Thrombocytopenia/leucocytosis has resolved.              --Hematuria has resolved. No dysuria.   9. Chronic back/Right knee pain: SEE #3 ABOVE; Continue voltaren  gel qid to knee and back till family bring in the biofreeze (which works better) --continue Lidocaine  patches at nights to help with back spasms. Decrease flexeril  to 2.5 mg every 8 hours prn-- now d/c'd as below             --schedule tylenol  500 mg qid as LFTs resolved.   10. Abnormal LFTs: Have resolved and Lipitor 10mg  daily added on 07/19. Continue to monitor weekly for  now.   8/5- will check CMP q Monday and BMP q Thursday- will check in AM since haven't rechecked in 1 week  8/6- LFTs have normalized 11. COPD/OSA: Has CPAP but not using PTA. Continue Yupelri  nebs daily (in place of Spiriva).  --continue albuterol  every night PTA.  .  12. Hypomagnesemia: Magnesium  reviewed and has normalized  13. Anemia: Hgb reviewed and is stable  14.  Bradycardia: HR reviewed and has normalized  15.  H/o MDD/anxiety: continue Lexapro  40mg  daily. Wellbutrin  started 75mg  BID, continue prn buspar   16. Bowel management:   Discussed that she had BM 8/11  17. R jaw/tooth pain: Completed augmentin  for potential dental infection,  needs to f/up outpatient with a dentist eventually.Benzocaine  ordered -CT maxillofacial neg on 7/31 8/6- did myofascial release- pt has good results.  8/7- pt reports it's so much better- unless has to do SLP exercises- d/w SLP to see if can change up some of these exercises- see what's causing the pain? Added baclofen  prn  S/p trigger point injections  18. Dry mouth: d/c flexeril     19. Hematoma on L lateral periorbital area, well circumscribed  8/3- cannot identify any trauma   8/5- Is spreading due to gravity/when sits up- educated daughter this is normal- she was a little concerned.    20. Altered mental status: CT head ordered and shows mild hemorrhagic conversion, neurology consulted, plavix  d/ced     LOS: 23 days A FACE TO FACE EVALUATION WAS PERFORMED  Carli Lefevers P Sreshta Cressler 07/31/2024, 1:34 PM

## 2024-07-31 NOTE — Progress Notes (Signed)
 Patient ID: [name], female   DOB: [birth year], 89 y.o.   MRN: [id number] Met with pt and daughter how is here to discuss discharge tomorrow. Have se tup [facility] for 11:00 for transport home. Also contacted [name]-daughter to see if received the tube feedings. She was at the airport picking up her son who had flown in to see pt. Let her know set up [facility] for 11:00 to transport home. Continue to try to see if tube feeding had been delivered at home. Prepare for discharge tomorrow

## 2024-07-31 NOTE — Progress Notes (Signed)
 Physical Therapy Discharge Summary  Patient Details  Name: [name] MRN: [id number] Date of Birth: 31-Mar-1932  Date of Discharge from PT service:July 31, 2024  Today's Date: 07/31/2024 PT Individual Time: 0900-0928 PT Individual Time Calculation (min): 28 min    Patient has met 3 of 5 long term goals due to improved activity tolerance, improved attention, and improved awareness.  Patient to discharge at a wheelchair level Max Assist.   Patient's care partner is independent to provide the necessary physical and cognitive assistance at discharge. [name], [name], has been trained on care. Family is also hiring private duty aid's to assist with home care.   Reasons goals not met: Ambulation, stairs, wheelchair mobility, and car transfer goals were discontinued given her slower than anticipated progress. Her other goals were downgraded to maxA. She did not meet sitting balance goals of modA - required maxA due to pushing tendencies.   Recommendation:  Patient will benefit from ongoing skilled PT services in home health setting to continue to advance safe functional mobility, address ongoing impairments in L sided weakness, caregiver training, home safety, functional transfers, and minimize fall risk.  Equipment: Presenter, broadcasting wheelchair through [employer], hospital bed, hoyer lift  Reasons for discharge: treatment goals met and discharge from hospital  Patient/family agrees with progress made and goals achieved: Yes  PT Discharge Precautions/Restrictions Precautions Precautions: Fall Precaution/Restrictions Comments: PEG, incontinence, dense L hemi, Pusher, jaw pain Restrictions Weight Bearing Restrictions Per Provider Order: No Pain Interference Pain Interference Pain Effect on Sleep: 2. Occasionally Pain Interference with Therapy Activities: 3. Frequently Pain Interference with Day-to-Day Activities: 3. Frequently Vision/Perception  Vision - History Ability  to See in Adequate Light: 0 Adequate Vision - Assessment Additional Comments: R gaze pref - can scan L with cueing and + time Perception Perception: Impaired Preception Impairment Details: Inattention/Neglect;Spatial orientation Praxis Praxis: Impaired Praxis Impairment Details: Initiation;Motor planning;Organization  Cognition Overall Cognitive Status: Impaired/Different from baseline Arousal/Alertness: Lethargic Orientation Level: Oriented X4 Year: 2025 Month: August Day of Week: Correct Attention: Sustained Sustained Attention: Impaired Sustained Attention Impairment: Functional basic Memory: Impaired Memory Impairment: Retrieval deficit;Decreased short term memory Decreased Short Term Memory: Verbal basic;Functional basic Awareness: Impaired Awareness Impairment: Emergent impairment Problem Solving: Impaired Problem Solving Impairment: Verbal complex;Functional complex Executive Function: Writer: Impaired Organizing Impairment: Functional basic Initiating: Impaired Initiating Impairment: Functional basic Safety/Judgment: Impaired Sensation Sensation Light Touch: Impaired Detail Light Touch Impaired Details: Impaired LLE;Impaired LUE Hot/Cold: Impaired by gross assessment Proprioception: Impaired Detail Proprioception Impaired Details: Impaired LUE;Impaired LLE Stereognosis: Impaired Detail Stereognosis Impaired Details: Impaired LLE;Impaired LUE Coordination Gross Motor Movements are Fluid and Coordinated: No Coordination and Movement Description: Dense L hemi Motor  Motor Motor: Hemiplegia;Abnormal postural alignment and control;Motor impersistence;Abnormal tone Motor - Skilled Clinical Observations: Pusher L and posteriorly  Mobility Bed Mobility Bed Mobility: Rolling Left;Rolling Right;Sit to Supine;Supine to Sit Rolling Right: Maximal Assistance - Patient 25-49% Rolling Left: Minimal Assistance - Patient > 75% Supine to Sit:  Maximal Assistance - Patient - Patient 25-49% Sit to Supine: Maximal Assistance - Patient 25-49% Transfers Transfers: Sit to Stand;Stand to Sit Sit to Stand: Maximal Assistance - Patient 25-49% Stand to Sit: Maximal Assistance - Patient 25-49% Transfer (Assistive device): 1 person hand held assist Locomotion  Gait Ambulation: Yes Gait Assistance: 2 Helpers Gait Distance (Feet): 8 Feet Assistive device: Other (Comment) (R hand rail) Gait Assistance Details: Tactile cues for initiation;Tactile cues for posture;Tactile cues for sequencing;Tactile cues for placement;Verbal cues for gait pattern;Verbal cues for sequencing;Verbal cues for  technique;Verbal cues for safe use of DME/AE;Manual facilitation for weight shifting;Verbal cues for precautions/safety;Manual facilitation for weight bearing;Manual facilitation for placement;Visual cues/gestures for precautions/safety;Tactile cues for weight shifting;Tactile cues for weight beaing;Visual cues/gestures for sequencing Gait Gait: Yes Gait Pattern: Impaired Gait Pattern: Step-to pattern;Decreased stance time - left;Decreased hip/knee flexion - left;Decreased weight shift to right;Decreased dorsiflexion - left;Narrow base of support;Poor foot clearance - left;Trunk flexed;Lateral trunk lean to left Stairs / Additional Locomotion Stairs: No Pick up small object from the floor (from standing position) activity did not occur: Safety/medical concerns Wheelchair Mobility Wheelchair Mobility: Yes Wheelchair Assistance: Dependent - Patient 0% Wheelchair Parts Management: Needs assistance Distance: 0'  Trunk/Postural Assessment  Cervical Assessment Cervical Assessment: Exceptions to [facility] (R rotation with gaze pref, forward head) Thoracic Assessment Thoracic Assessment: Exceptions to [facility] (rounded shoulders) Lumbar Assessment Lumbar Assessment: Exceptions to [facility] (posterior pelvic tikt) Postural Control Postural Control: Deficits on evaluation Trunk  Control: pushes left and posterior Righting Reactions: significantly delayed  Balance Balance Balance Assessed: Yes Static Sitting Balance Static Sitting - Balance Support: Feet supported;No upper extremity supported Static Sitting - Level of Assistance: 2: Max assist Static Sitting - Comment/# of Minutes: pushing L and posterior Dynamic Sitting Balance Dynamic Sitting - Balance Support: Feet supported;No upper extremity supported Dynamic Sitting - Level of Assistance: 1: +1 Total assist Static Standing Balance Static Standing - Balance Support: Right upper extremity supported Static Standing - Level of Assistance: 2: Max assist Extremity Assessment      RLE Assessment RLE Assessment: Exceptions to [facility] General Strength Comments: Grossly 4/5 LLE Assessment LLE Assessment: Exceptions to [facility] LLE Strength LLE Overall Strength: Deficits Left Hip Flexion: 1/5 Left Hip Extension: 1/5 Left Hip ABduction: 1/5 Left Hip ADduction: 1/5 Left Knee Flexion: 0/5 Left Knee Extension: 1/5 Left Ankle Dorsiflexion: 0/5 Left Ankle Plantar Flexion: 0/5 Left Ankle Inversion: 0/5 Left Ankle Eversion: 0/5   Treatment: Pt in bed to start - denies any resting jaw pain, reports it's unpredictable and that she expects it to happen at any time.  Patient assisted to EOB with maxA with hospital bed features. Requires totalA for forward scooting to EOB to achieve feet flat. She requires maxA for sitting balance, statically. Spent time working on [employer] for sitting balance to promote R lean and forward posture. Responded well to having her slide and reach her RUE along foot of bed, able to briefly achieve CGA sitting balance for ~10-15 seconds. Poor attention and easily distracted resulting in further LOB.   Patient assisted to w/c via squat pivot transfer using over the back technique, mod/maxA towards her stronger R side. Positioned with towel roll on her L trunk and wheelchair reclined for  safety/comfort. Left with call bell in reach, needs met.   Porfiria Heinrich P Ayvion Kavanagh PT 07/31/2024, 9:32 AM

## 2024-07-31 NOTE — Progress Notes (Signed)
 Physical Therapy Session Note  Patient Details  Name: [name] MRN: [id number] Date of Birth: 1932/11/04  Today's Date: 07/31/2024 PT Individual Time: 8567-8541 PT Individual Time Calculation (min): 26 min   Short Term Goals: Week 3:  PT Short Term Goal 1 (Week 3): STG = LTG  Skilled Therapeutic Interventions/Progress Updates:      PT present to make up missed minutes. Pt seated in recliner upon arrival. Pt requesting to return to bed. Pt agreeable to therapy. Pt denies any pain but reports neck stiffness.    Stand pivot transfer +2 max A recliner to bed. Pt performed rolling R and L with max A to doff soiled pants and brief, perform pericare and donn/doff clean pants/brief. Pt performed modifiied bridge with therapist assisitng L LE into flexion and stabilizing bridge.   Pt supine in bed with all needs within reach and bed alarm on.   Therapy Documentation Precautions:  Precautions Precautions: Fall Recall of Precautions/Restrictions: Impaired Precaution/Restrictions Comments: PEG, incontinence, dense L hemi, Pusher, jaw pain Restrictions Weight Bearing Restrictions Per Provider Order: No   Therapy/Group: Individual Therapy  [facility] Doreene Orris, [place], DPT  07/31/2024, 2:38 PM

## 2024-07-31 NOTE — Progress Notes (Signed)
 Nurse called to room for patient having difficulty with BM. Nurse performed dig stim and felt patient impacted. Nurse attempted to disimpact but pt couldn't tolerate standing for extended period. Nurse administered soap suds enema with successful results. Patient was able to tolerate and empty bowels. Pt appreciated relief. Personal hygiene performed and pt sat in recliner, met with SLP.

## 2024-07-31 NOTE — Discharge Instructions (Addendum)
 Inpatient Rehab Discharge Instructions  [name] Discharge date and time:  08/01/24  Activities/Precautions/ Functional Status: Activity: no lifting, driving, or strenuous exercise  till cleared by MD Diet: Pureed foods, Liquids have to be thickened to nectar consistency Wound Care: keep PEG site clean and dry   Functional status:  ___ No restrictions     ___ Walk up steps independently _X__ 24/7 supervision/assistance   ___ Walk up steps with assistance ___ Intermittent supervision/assistance  ___ Bathe/dress independently ___ Walk with walker     _X__ Bathe/dress with assistance ___ Walk Independently    ___ Shower independently ___ Walk with assistance    ___ Shower with assistance _X__ No alcohol      ___ Return to work/school ________   Special Instructions:    COMMUNITY REFERRALS UPON DISCHARGE:    Home Health:   PT   OT    SP    RN    AIDE                Agency:[facility]   Phone:[phone]    Medical Equipment/Items Ordered:SUCTION MACHINE, HOSPITAL BED, HOYER LIFT, NEBULIZER MACHINE, DROP-ARM BEDSIDE COMMODE, TUBE FEEDINGS                                                 Agency/Supplier:[facility]   [phone] LIST FOR PRIVATE DUTY AGENCIES.    STROKE/TIA DISCHARGE INSTRUCTIONS SMOKING Cigarette smoking nearly doubles your risk of having a stroke & is the single most alterable risk factor  If you smoke or have smoked in the last 12 months, you are advised to quit smoking for your health. Most of the excess cardiovascular risk related to smoking disappears within a year of stopping. Ask you doctor about anti-smoking medications [place] Quit Line: 1-800-QUIT NOW Free Smoking Cessation Classes [phone]  CHOLESTEROL Know your levels; limit fat & cholesterol in your diet  Lipid Panel     Component Value Date/Time   CHOL 96 06/26/2024 0530   TRIG 74 06/26/2024 0530   TRIG 74 06/26/2024 0530   HDL 35 (L) 06/26/2024 0530    CHOLHDL 2.7 06/26/2024 0530   VLDL 15 06/26/2024 0530   LDLCALC 46 06/26/2024 0530     Many patients benefit from treatment even if their cholesterol is at goal. Goal: Total Cholesterol (CHOL) less than 160 Goal:  Triglycerides (TRIG) less than 150 Goal:  HDL greater than 40 Goal:  LDL (LDLCALC) less than 100   BLOOD PRESSURE American Stroke Association blood pressure target is less that 120/80 mm/Hg  Your discharge blood pressure is:  BP: 130/61 Monitor your blood pressure Limit your salt and alcohol  intake Many individuals will require more than one medication for high blood pressure  DIABETES (A1c is a blood sugar average for last 3 months) Goal HGBA1c is under 7% (HBGA1c is blood sugar average for last 3 months)  Diabetes: No known diagnosis of diabetes    Lab Results  Component Value Date   HGBA1C 5.1 06/26/2024    Your HGBA1c can be lowered with medications, healthy diet, and exercise. Check your blood sugar as directed by your physician Call your physician if you experience unexplained or low blood sugars.  PHYSICAL ACTIVITY/REHABILITATION Goal is 30 minutes at least 4 days per week  Activity: No driving, Therapies: See  above Return to work: N/A Activity decreases your risk of heart attack and stroke and makes your heart stronger.  It helps control your weight and blood pressure; helps you relax and can improve your mood. Participate in a regular exercise program. Talk with your doctor about the best form of exercise for you (dancing, walking, swimming, cycling).  DIET/WEIGHT Goal is to maintain a healthy weight  Your discharge diet is:  Diet Order             DIET - DYS 1 Fluid consistency: Nectar Thick  Diet effective now                   liquids Your height is:  Height: 5' 1 (154.9 cm) Your current weight is: Weight: 63.3 kg Your Body Mass Index (BMI) is:  BMI (Calculated): 26.38 Following the type of diet specifically designed for you will help prevent  another stroke. Your goal weight range is:   Your goal Body Mass Index (BMI) is 19-24. Healthy food habits can help reduce 3 risk factors for stroke:  High cholesterol, hypertension, and excess weight.  RESOURCES Stroke/Support Group:  Call [phone]   STROKE EDUCATION PROVIDED/REVIEWED AND GIVEN TO PATIENT Stroke warning signs and symptoms How to activate emergency medical system (call 911). Medications prescribed at discharge. Need for follow-up after discharge. Personal risk factors for stroke. Pneumonia vaccine given:  Flu vaccine given:  My questions have been answered, the writing is legible, and I understand these instructions.  I will adhere to these goals & educational materials that have been provided to me after my discharge from the hospital.         My questions have been answered and I understand these instructions. I will adhere to these goals and the provided educational materials after my discharge from the hospital.  Patient/Caregiver Signature _______________________________ Date __________  Clinician Signature _______________________________________ Date __________  Please bring this form and your medication list with you to all your follow-up doctor's appointments.

## 2024-07-31 NOTE — Progress Notes (Signed)
 Speech Language Pathology Discharge Summary  Patient Details  Name: [name] MRN: [id number] Date of Birth: [birth year]  Date of Discharge from SLP service:July 31, 2024  Today's Date: 07/31/2024 SLP Individual Time: 8980-8940 SLP Individual Time Calculation (min): 40 min   Skilled Therapeutic Interventions:   Initial 20 minutes of session missed due to nursing care and enema.   Skilled therapy session focused on dysphagia and cognitive goals. SLP facilitated session by providing oral care and offering tsp sips of thin liquids and ice chips. Patient with no s/sx of aspiration during ice chips and immediate coughs after tsp sips of water  in 1/3 trials. Recommend initiation of fraizer free water  protocol for d/c. SLP prompted completion of EMST for respiratory strength at 15cm H2O for 25 repetitions. SLP targeted cognitive goals through simple problem solving and L attention task. Patient required minA to match cards according to shape, color and number accross table span. Patient left in chair with alarm set and call bell in reach. Continue POC.      Patient has met 3 of 3 long term goals.  Patient to discharge at [facility] level.  Reasons goals not met: n/a   Clinical Impression/Discharge Summary:  Pt has made great gains and has met 3 of 3 LTG's this admission due to improved dysphagia and cognition. Pt is currently an overall minA for cognitive tasks and requires min cues for utilization of swallowing compensatory strategies to minimize overt s/sx of aspiration with D1/NTL diet. Pt/family education complete and pt will discharge home with 24 hour supervision from friends/family/etc. Pt would benefit from [facility] f/u ST services to maximize cognition and dysphagia in order to maximize functional independence.   Care Partner:  Caregiver Able to Provide Assistance: Yes  Type of Caregiver Assistance: Physical;Cognitive  Recommendation:  Home Health SLP;Outpatient SLP;24 hour  supervision/assistance  Rationale for SLP Follow Up: Maximize cognitive function and independence;Maximize swallowing safety   Equipment: suction   Reasons for discharge: Treatment goals met;Discharged from hospital   Patient/Family Agrees with Progress Made and Goals Achieved: Yes    [name] M.A., CCC-SLP 07/31/2024, 6:50 AM

## 2024-08-01 MED ORDER — REVEFENACIN 175 MCG/3ML IN SOLN: 175.0000 ug | Freq: Every day | RESPIRATORY_TRACT | 1 refills | Status: DC

## 2024-08-01 NOTE — Progress Notes (Signed)
 Patient ID: [name], female   DOB: 30-Jul-1932, 89 y.o.   MRN: [id number] Given supplies for PEG management including syringes, sterile water , connector, cylinders, pill crusher/spliter and split gauze with NS cleaner wipes for PEG site. Written instructions for PEG care in notebook. Given information on where to obtain spring/drinking water  for flushes; to avoid tap/well/and distilled water  for PEG.  Also given additional Gerhardt's cream. [name]

## 2024-08-01 NOTE — Progress Notes (Signed)
 Patient's family member Mrs. [name] Console contacted that there is a home medication in the pharmacy. However, voicemail of Mrs. Console not set up unable to leave a voicemail. Medication is in pharmacy at this time. TOC medication was brought up to the room and given to family member. Provider reviewed and given discharge instructions to patient's family member. Pt requesting pain medication see MAR and flowsheet. [facility] transporting patient to place of residence will be living at. Family took belongings home today and day prior. No further issues or concerns to report at this time.

## 2024-08-01 NOTE — Progress Notes (Signed)
 Inpatient Rehabilitation Care Coordinator Discharge Note   Patient Details  Name: [name] MRN: [id number] Date of Birth: August 17, 1932   Discharge location: HOME WITH DAUGHTER AND HIRED CAREGIVERS-24/7 CARE  Length of Stay: 24 DAYS  Discharge activity level: MOD/MAX ASSIST  Home/community participation: ACTIVE  Patient response un:Yzjouy Literacy - How often do you need to have someone help you when you read instructions, pamphlets, or other written material from your doctor or pharmacy?: Sometimes  Patient response un:Dnrpjo Isolation - How often do you feel lonely or isolated from those around you?: Rarely  Services provided included: MD, RD, PT, OT, SLP, RN, CM, TR, Pharmacy, Neuropsych, SW  Financial Services:  Field seismologist Utilized: Scientific laboratory technician MEDICARE  Choices offered to/list presented to: PT AND DAUGHTER  Follow-up services arranged:  Home Health, DME, Patient/Family has no preference for HH/DME agencies [facility] Health Agency: [facility]  PT  OT  SP  RN  AIDE    DME : [facility]  HOSPITAL BED, HOYER LIFT, NEBULIZER MACHINE, SUCTION MACHINE, TUBE FEEDING AND DROP=ARM BEDSIDE COMMODE   NU MOTION FOR WHEELCHAIR  TUBE FEEDINGS WILL BE DELIVERED TO HOME-TODAY DAUGHTER SPOKE WITH [facility] AND IS AWARE HOME VIA NON-EMERGENCY AMBULANCE [name] HAS HIRED ASSIST TO HELP HER AT HOME WITH MOM Patient response to transportation need: Is the patient able to respond to transportation needs?: Yes In the past 12 months, has lack of transportation kept you from medical appointments or from getting medications?: No In the past 12 months, has lack of transportation kept you from meetings, work, or from getting things needed for daily living?: No   Patient/Family verbalized understanding of follow-up arrangements:  Yes  Individual responsible for coordination of the follow-up plan: [name] [phone]  Confirmed correct DME delivered: [name] [name] 08/01/2024    Comments (or additional information):[name] WAS HERE DAILY AND STAYED OVER NIGHT. SHE WAS TRAINED IN MOM'S CARE. OTHER SISTER'S WERE HERE BUT DID NOT PARTICIPATE IN MOMS' CARE.   Summary of Stay    Date/Time Discharge Planning CSW  07/30/24 9081 [name]-daughter has been here and participated in Mom's care with plans to take home with 24/7 care. Has hired caregivers and DME delivered and [name] in place.  Will need to go home via non-emergency ambulance transport. Palliative care involved also RGD  07/23/24 9147 [name]-daughter wants to take home due to trying to follow pt's wishes, other daughter's want to pursue SNF. Will need to begin family training and daughter working on fixing ramp at home and widening door ways. Will need to order DME and [facility] RGD  07/16/24 0849 Has hired caregiver and with children's assist plan for home with 24/7 care. Will confirm plan. Has PEG now placed 7/28 RGD  07/09/24 1731 Evals pending; per chart, patient lived alone, has an aide 5 hours/day/5 days/wk PTA DBS       Bliss Tsang, Taylor [name]

## 2024-08-01 NOTE — Progress Notes (Signed)
 Inpatient Rehabilitation Discharge Medication Review by a Pharmacist   A complete drug regimen review was completed for this patient to identify any potential clinically significant medication issues.  High Risk Drug Classes Is patient taking? Indication by Medication  Antipsychotic No   Anticoagulant No   Antibiotic No   Opioid Yes Tramadol  - severe pain  Antiplatelet Yes Aspirin  81 mg - CVA prophylaxis  Hypoglycemics/insulin  No   Vasoactive Medication Yes NTG sublingual -chest pain  Chemotherapy No   Other Yes Acetaminophen  - pain control Albuterol  HFA  -wheezing, shortness of breath/ COPD Atorvastatin  - hyperlipidemia Baclofen - muscle spasms Bupropion ,Escitalopram , Buspar - depression/ anxiety Diclofenac  gel (knee pain),Lidocaine  patches (back) - topical pain relief), Muscle rub- pain relief Melatonin - sleep/ insomnia Miralax , senna- constipation Modafinil -daytime somnolence  Tiotropium Inhalation (Spiriva) - COPD Vitamin D , Metanx, Multivitamin - supplements      Type of Medication Issue Identified Description of Issue Recommendation(s)  Drug Interaction(s) (clinically significant)     Duplicate Therapy     Allergy     No Medication Administration End Date     Incorrect Dose     Additional Drug Therapy Needed   .  Significant med changes from prior encounter (inform family/care partners about these prior to discharge).  Plavix  discontinued on 07/28/24--3 weeks out so no longer needed per Dr. Verneita Holley Schmitz PA. To continue ASA.  New meds: Diclofenac  gel, lidocaine  patches, Baclofen , Tramadol , buspar , modafanil, Metanx, laxatives. Vitamin D  dose decreased (normal level 7/23).   Cyclopbenzaprine discontinued. Changed to Baclofen .  Discontinued Lidocaine  5% gel> changed to lidocaine  topical patches. Communicate changes with patient/family prior to discharge. Follow discharge medication instructions on AVS.   Other  DC summary indicates plan to resume  PTA  Pantoprazole . Cannot crush. Meds currently given per tube or crushed.  I discussed with Sharlet Schmitz, PA who confirms discontinue Protonix  for now as can not crush.     Clinically significant medication issues were identified that warrant physician communication and completion of prescribed/recommended actions by midnight of the next day:  No  Pharmacist comments:  - Neuro recommended Clopidogrel  if no further procedures; question need for PEG. - Time spent performing this drug regimen review (minutes): 45   Levorn Gaskins, [place] Clinical Pharmacist 08/01/2024 9:59 AM

## 2024-08-01 NOTE — Progress Notes (Signed)
 PROGRESS NOTE   Subjective/Complaints: No new complaints this morning Ready for d/c today Daughter is at bedside Discussed decrease in TF   ROS:   Pt denies SOB, abd pain, CP, V/C/D, and vision changes Jaw pain -intermittent, improved with antispasmodics +nausea, but better now +left sided lean +new onset headache- resolved   Per HPI Objective:     Recent Labs    07/31/24 0426  WBC 6.5  HGB 12.1  HCT 36.8  PLT 156      Recent Labs    07/31/24 0426  NA 138  K 3.9  CL 103  CO2 26  GLUCOSE 81  BUN 16  CREATININE 0.61  CALCIUM  9.2       Intake/Output Summary (Last 24 hours) at 08/01/2024 1000 Last data filed at 07/31/2024 2220 Gross per 24 hour  Intake 796 ml  Output --  Net 796 ml        Physical Exam: Vital Signs Blood pressure (!) 142/63, pulse 67, temperature 98.4 F (36.9 C), temperature source Oral, resp. rate 17, height 5' 1 (1.549 m), weight 63 kg, SpO2 96%.      General: awake and alert; daughter at bedside;  NAD. Sitting up in w/c HENT: conjugate gaze; oropharynx little dry, small bruise to L periorbital area CV: regular rate and rhythm; no JVD Pulmonary: CTA B/L; no W/R/R- good air movement GI: soft, NT, ND, (+)BS Psychiatric: appropriate Neurological: alert, L inattention Skin: multiple bruises throughout skin esp on abd c/w lovenox  injections.   MSK- L jaw pain is less tight- also neck tightness is also less in upper traps, levators, SCM's,  scalenes    Comments:  RUE 5-/5 LUE 0/5 throughout RLE- 4+/5 throughout- hard to get pt to do exam LLE- PF 1/5- otherwise 0/5 in LLE, sensation intact to left foot, stable 8/15  Neurological:     Mental Status: She is alert.     Comments: Mild dysarthria with occasional wet voice. Left facial weakness with left inattention. Left HH and needs cues to look to left. A. She was able to answer basic questions, name and place, but  thought it was SUnday , not Monday   Left hemiparesis with sensory deficits.  No clonus, no hoffman's  Flaccid tone on L side  Psychiatric:     Comments: Alert and smiling   Assessment/Plan: 1. Functional deficits which require 3+ hours per day of interdisciplinary therapy in a comprehensive inpatient rehab setting. Physiatrist is providing close team supervision and 24 hour management of active medical problems listed below. Physiatrist and rehab team continue to assess barriers to discharge/monitor patient progress toward functional and medical goals  Care Tool:  Bathing    Body parts bathed by patient: Left arm, Chest, Abdomen, Right upper leg, Left upper leg, Face   Body parts bathed by helper: Right arm, Buttocks, Front perineal area, Left lower leg, Right lower leg     Bathing assist Assist Level: Maximal Assistance - Patient 24 - 49%     Upper Body Dressing/Undressing Upper body dressing   What is the patient wearing?: Pull over shirt    Upper body assist Assist Level: Moderate Assistance - Patient 50 -  74%    Lower Body Dressing/Undressing Lower body dressing      What is the patient wearing?: Pants, Incontinence brief     Lower body assist Assist for lower body dressing: Maximal Assistance - Patient 25 - 49%     Toileting Toileting Toileting Activity did not occur (Clothing management and hygiene only): N/A (no void or bm)  Toileting assist Assist for toileting: 2 Helpers     Transfers Chair/bed transfer  Transfers assist  Chair/bed transfer activity did not occur: Safety/medical concerns (did not get up)  Chair/bed transfer assist level: Maximal Assistance - Patient 25 - 49%     Locomotion Ambulation   Ambulation assist   Ambulation activity did not occur: Safety/medical concerns  Assist level: 2 helpers Assistive device: Other (comment) (R  hand rail) Max distance: 36ft   Walk 10 feet activity   Assist  Walk 10 feet activity did not  occur: Safety/medical concerns        Walk 50 feet activity   Assist Walk 50 feet with 2 turns activity did not occur: Safety/medical concerns         Walk 150 feet activity   Assist Walk 150 feet activity did not occur: Safety/medical concerns         Walk 10 feet on uneven surface  activity   Assist Walk 10 feet on uneven surfaces activity did not occur: Safety/medical concerns         Wheelchair     Assist Is the patient using a wheelchair?: Yes Type of Wheelchair: Manual    Wheelchair assist level: Dependent - Patient 0% Max wheelchair distance: 0'    Wheelchair 50 feet with 2 turns activity    Assist        Assist Level: Dependent - Patient 0%   Wheelchair 150 feet activity     Assist      Assist Level: Dependent - Patient 0%   Blood pressure (!) 142/63, pulse 67, temperature 98.4 F (36.9 C), temperature source Oral, resp. rate 17, height 5' 1 (1.549 m), weight 63 kg, SpO2 96%.  Medical Problem List and Plan: 1. Functional deficits secondary to R MCA stroke             -patient may  shower             -ELOS/Goals: 16-20 days- min-mod A  D3 started, increase to 2,000U, metanx also ordered  D/c home  2.  Impaired mobility: -DVT/anticoagulation:  Pharmaceutical: continue Lovenox  40mg  daily             -antiplatelet therapy: Continue ASA.  Plavix  d/ced as per neurology recommendations given hemorrhagic conversion  3. Muscle spasms in back: ccontinue lidocaine  patches. Tylenol  500mg  TID/HS, Baclofen  5mg  added. Voltaren  gel. Kpad ordered  --sedated from Flexeril  5 mg/tramadol  50 mg. Will decrease doses. Decrease prn Tramadol  to 50mg  q8H prn, d/c flexeril   Continue kpad  4. Hospital induced delirium at night: daughter may stay over at night -continue melatonin 5mg  at 8 pm tonight. Sleep wake chart to monitor sleep              -antipsychotic agents: N/A  8/7- much more able to tell me what's going on  UA and UC reviewed and do  not show significant abnormallities  5. Neuropsych/cognition: This patient may be intermittently capable of making decisions on her own behalf.  6. Daytime somnolence: continue modafinil  100mg  daily ordered  7. Dysphagia/dry mouth: NPO on PEG feeds 7/29, transitioned to bolus  feeds, Osmolite bolus QID  continue ice chips  Pt is now on D2 nectar thick liquids and close to thin liquids per SLP- after    Decrease TF to BID 8.  E coli bacteremia: Treated with 10 day course of antibiotics. Ceftriaxone -->Duricef thru 07/14. Thrombocytopenia/leucocytosis has resolved.              --Hematuria has resolved. No dysuria.   9. Chronic back/Right knee pain: SEE #3 ABOVE; Continue voltaren  gel qid to knee and back till family bring in the biofreeze (which works better) --continue Lidocaine  patches at nights to help with back spasms. Decrease flexeril  to 2.5 mg every 8 hours prn-- now d/c'd as below             --schedule tylenol  500 mg qid as LFTs resolved.   10. Abnormal LFTs: Have resolved and Lipitor 10mg  daily added on 07/19. Continue to monitor weekly for  now.   8/5- will check CMP q Monday and BMP q Thursday- will check in AM since haven't rechecked in 1 week  8/6- LFTs have normalized 11. COPD/OSA: Has CPAP but not using PTA. Continue Yupelri  nebs daily (in place of Spiriva).  --continue albuterol  every night PTA.  .  12. Hypomagnesemia: Magnesium  reviewed and has normalized  13. Anemia: Hgb reviewed and is stable  14.  Bradycardia: HR reviewed and has normalized  15.  H/o MDD/anxiety: continue Lexapro  40mg  daily. Wellbutrin  started 75mg  BID, continue prn buspar   16. Bowel management:   Discussed that she had BM 8/14 after being given an enema for impaction  17. R jaw/tooth pain: Completed augmentin  for potential dental infection, needs to f/up outpatient with a dentist eventually.Benzocaine  ordered -CT maxillofacial neg on 7/31 8/6- did myofascial release- pt has good results.  8/7-  pt reports it's so much better- unless has to do SLP exercises- d/w SLP to see if can change up some of these exercises- see what's causing the pain? Continue baclofen  prn  S/p trigger point injections  18. Dry mouth: d/c flexeril     19. Hematoma on L lateral periorbital area, well circumscribed, stable 8/15  20. Altered mental status: CT head ordered and shows mild hemorrhagic conversion, neurology consulted, plavix  d/ced, stable 8/15   >30 minutes spent in discharge of patient including review of medications and follow-up appointments, physical examination, and in answering all patient's questions      LOS: 24 days A FACE TO FACE EVALUATION WAS PERFORMED  Tyan Dy P Lyriq Finerty 08/01/2024, 10:00 AM

## 2024-08-02 DIAGNOSIS — B9620 Unspecified Escherichia coli [E. coli] as the cause of diseases classified elsewhere: Secondary | ICD-10-CM | POA: Diagnosis not present

## 2024-08-02 DIAGNOSIS — E440 Moderate protein-calorie malnutrition: Secondary | ICD-10-CM | POA: Diagnosis not present

## 2024-08-02 DIAGNOSIS — F329 Major depressive disorder, single episode, unspecified: Secondary | ICD-10-CM | POA: Diagnosis not present

## 2024-08-02 DIAGNOSIS — J439 Emphysema, unspecified: Secondary | ICD-10-CM | POA: Diagnosis not present

## 2024-08-02 DIAGNOSIS — F419 Anxiety disorder, unspecified: Secondary | ICD-10-CM | POA: Diagnosis not present

## 2024-08-02 DIAGNOSIS — N390 Urinary tract infection, site not specified: Secondary | ICD-10-CM | POA: Diagnosis not present

## 2024-08-02 DIAGNOSIS — I69354 Hemiplegia and hemiparesis following cerebral infarction affecting left non-dominant side: Secondary | ICD-10-CM | POA: Diagnosis not present

## 2024-08-02 DIAGNOSIS — E1151 Type 2 diabetes mellitus with diabetic peripheral angiopathy without gangrene: Secondary | ICD-10-CM | POA: Diagnosis not present

## 2024-08-02 DIAGNOSIS — Z431 Encounter for attention to gastrostomy: Secondary | ICD-10-CM | POA: Diagnosis not present

## 2024-08-03 NOTE — Discharge Summary (Signed)
 Physician Discharge Summary  Patient ID: [name] MRN: [id number] DOB/AGE: 1932-02-10 89 y.o.  Admit date: 07/08/2024 Discharge date: 08/01/2024  Discharge Diagnoses:  Principal Problem:   Acute ischemic right MCA stroke [facility]) Active Problems:   Depression   COPD (chronic obstructive pulmonary disease) (HCC)   Moderate malnutrition (HCC)  Left Jaw pain  Dysphagia  Left hemiplegia  Shoulder pain  Headaches  Back pain   Sleep wake disruption     Discharged Condition: stable  Significant Diagnostic Studies: CT HEAD WO CONTRAST ( ) Result Date: 07/29/2024 CLINICAL DATA:  Headache. Altered mental status. Recent hemorrhagic transformation of stroke. EXAM: CT HEAD WITHOUT CONTRAST TECHNIQUE: Contiguous axial images were obtained from the base of the skull through the vertex without intravenous contrast. RADIATION DOSE REDUCTION: This exam was performed according to the departmental dose-optimization program which includes automated exposure control, adjustment of the mA and/or kV according to patient size and/or use of iterative reconstruction technique. COMPARISON:  Head CT 07/28/2024 FINDINGS: Brain: A large late subacute right MCA infarct is again seen. Associated, primarily gyral hyperdensity within the infarct is unchanged and consistent with hemorrhagic conversion, possibly with an element of mineralization as well. There is no midline shift or other significant mass effect. No definite new infarct or new hemorrhage is identified within limitation of motion artifact. There is a background of moderate chronic small vessel ischemia in the cerebral white matter. There is mild cerebral atrophy. Vascular: Calcified atherosclerosis at the skull base. No hyperdense vessel. Skull: No fracture or suspicious lesion. Sinuses/Orbits: Paranasal sinuses and mastoid air cells are clear. Bilateral cataract extraction. Other: None. IMPRESSION: 1. Unchanged appearance of large subacute right MCA  infarct with hemorrhagic conversion. 2. No evidence of new intracranial abnormality. Electronically Signed   By: Dasie Hamburg M.D.   On: 07/29/2024 11:06   CT HEAD WO CONTRAST ( ) Addendum Date: 07/28/2024  ADDENDUM #1  ADDENDUM: Findings discussed with Dr. Lorilee at 12:53 pm on 07/28/24. ---------------------------------------------------- Electronically signed by: Donnice Mania MD 07/28/2024 12:54 PM EDT RP Workstation: [id number]   Result Date: 07/28/2024* EXAM: CT HEAD WITHOUT CONTRAST 07/28/2024 12:01:45 PM TECHNIQUE: CT of the head was performed without the administration of intravenous contrast. Automated exposure control, iterative reconstruction, and/or weight based adjustment of the mA/kV was utilized to reduce the radiation dose to as low as reasonably achievable. COMPARISON: MRI head 06/26/2024. CLINICAL HISTORY: Worsening confusion and left sided lean, recent CVA. F/u post thrombectomy. FINDINGS: BRAIN AND VENTRICLES: There is encephalomalacia within the right MCA (middle cerebral artery) territory compatible with infarct. Multiple new areas of hyperattenuation throughout the right MCA territory primarily involving the cortex within the region of infarct concerning for areas of hemorrhagic conversion. The degree of hemorrhage is greater than would be expected for petechial hemorrhage. The ventricles are unremarkable. No midline shift. The basilar cisterns are patent. Posterior fossa is unremarkable. Similar appearance of prominence of extraaxial spaces over the anterior frontal lobes. ORBITS: Bilateral lens replacement. SINUSES: No acute abnormality. SOFT TISSUES AND SKULL: Atherosclerosis at the skull base involving the carotid siphons. IMPRESSION: 1. Late subacute right MCA territory infarct. 2. Multiple new areas of hyperattenuation throughout the right MCA territory, concerning for hemorrhagic conversion. 3. No midline shift. Electronically signed by: Donnice Mania MD 07/28/2024 12:41 PM EDT RP  Workstation: [id number]   DG Swallowing Func-Speech Pathology Result Date: 07/24/2024 Table formatting from the original result was not included. Modified Barium Swallow Study Patient Details Name: [name] MRN: [id number] Date of Birth: July 09, 1932 Today's  Date: 07/24/2024 HPI/PMH: HPI: [name] is an 89 yo female presenting to ED 7/5 for RUQ pain, nausea/vomiting. Found to have suspected cholecystitis vs passed choledocholithiasis and acute liver injury, hyponatremia, bacteriuria, and hematuria. Initially seen [facility] ED 7/3 with n/v and elevated LFTs but left without being seen. Daughter noted sudden onset AMS and L sided weakness 7/9 with CTA showing abrupt occlusion of distal R MCA M1 s/p TNK. Taken to IR with unsuccessful revascularization (repeated reocclusion), post-procedure CTH showed small SAH. Remained intubated after the procedure, 7/9-7/10. PMH includes HLD, PAD, COPD with emphysema, OSA, R breast cancer, back pain, esophageal spasm Clinical Impression: Clinical Impression: Pt exhibits markedly improved oropharyngeal dysphagia compared to previous MBS with aspiration only observed with thin liquids due to mild timing deficits of pharyngeal structure movement. With thin liquids from cup, patient took very small sips (often smaller than teaspoon), however when prompted to take a larger sip exhibited transient aspiration where material moved down the posterior wall of the laryngeal vestibule and below the vocal folds, then back out. However, residue remained in the vestibule and was aspirated audibly during oral preparation of solid.  Despite attempts to clear material from the trachea, cough was unsuccessful. No significant penetration/aspiration observed with all other trials and no significant residue observed across consistencies other than noted. Patient grabbed jaw during chewing of Dys3 solids, and given acute jaw pain present since yesterday, recommend initiation of Dys1/NTL diet to limit  chewing. As patient's jaw pain improves, will be able to upgrade diet. Administer medications whole in puree. SLP will continue to follow to target increased cough strength and manage diet upgrade as able. Factors that may increase risk of adverse event in presence of aspiration [name] & [name] 2021): Factors that may increase risk of adverse event in presence of aspiration [name] & [name] 2021): Reduced cognitive function; Limited mobility; Frail or deconditioned Recommendations/Plan: Swallowing Evaluation Recommendations Swallowing Evaluation Recommendations Recommendations: PO diet PO Diet Recommendation: Dysphagia 1 (Pureed); Mildly thick liquids (Level 2, nectar thick) Liquid Administration via: Cup; Spoon; Straw Medication Administration: Whole meds with puree Supervision: Full supervision/cueing for swallowing strategies Swallowing strategies  : Minimize environmental distractions; Slow rate; Small bites/sips Postural changes: Position pt fully upright for meals Oral care recommendations: Oral care BID (2x/day) Caregiver Recommendations: Avoid jello, ice cream, thin soups, popsicles; Remove water  pitcher Treatment Plan Treatment Plan Treatment recommendations: Therapy as outlined in treatment plan below Follow-up recommendations: Home health SLP Functional status assessment: Patient has had a recent decline in their functional status and demonstrates the ability to make significant improvements in function in a reasonable and predictable amount of time. Treatment frequency: Min 2x/week Treatment duration: 2 weeks Interventions: Aspiration precaution training; Oropharyngeal exercises; Compensatory techniques; Patient/family education; Trials of upgraded texture/liquids Recommendations Recommendations for follow up therapy are one component of a multi-disciplinary discharge planning process, led by the attending physician.  Recommendations may be updated based on patient status, additional functional  criteria and insurance authorization. Assessment: Orofacial Exam: Orofacial Exam Oral Cavity: Oral Hygiene: WFL Oral Cavity - Dentition: Dentures, not available; Poor condition; Missing dentition Orofacial Anatomy: WFL Oral Motor/Sensory Function: Suspected cranial nerve impairment CN V - Trigeminal: WFL CN VII - Facial: Left motor impairment CN IX - Glossopharyngeal, CN X - Vagus: Left motor impairment CN XII - Hypoglossal: Left motor impairment Anatomy: Anatomy: Suspected cervical osteophytes; Prominent cricopharyngeus Boluses Administered: Boluses Administered Boluses Administered: Thin liquids (Level 0); Mildly thick liquids (Level 2, nectar thick); Moderately thick liquids (Level 3,  honey thick); Puree; Solid  Oral Impairment Domain: Oral Impairment Domain Lip Closure: No labial escape Tongue control during bolus hold: Cohesive bolus between tongue to palatal seal Bolus preparation/mastication: Slow prolonged chewing/mashing with complete recollection Bolus transport/lingual motion: Slow tongue motion Oral residue: Trace residue lining oral structures Location of oral residue : Tongue; Palate Initiation of pharyngeal swallow : Pyriform sinuses  Pharyngeal Impairment Domain: Pharyngeal Impairment Domain Soft palate elevation: No bolus between soft palate (SP)/pharyngeal wall (PW) Laryngeal elevation: Complete superior movement of thyroid  cartilage with complete approximation of arytenoids to epiglottic petiole Anterior hyoid excursion: Complete anterior movement Epiglottic movement: Complete inversion Laryngeal vestibule closure: Complete, no air/contrast in laryngeal vestibule Pharyngeal stripping wave : Present - complete Pharyngeal contraction (A/P view only): N/A Pharyngoesophageal segment opening: Complete distension and complete duration, no obstruction of flow Tongue base retraction: Trace column of contrast or air between tongue base and PPW Pharyngeal residue: Trace residue within or on pharyngeal  structures Location of pharyngeal residue: Valleculae; Pyriform sinuses  Esophageal Impairment Domain: No data recorded Pill: No data recorded Penetration/Aspiration Scale Score: Penetration/Aspiration Scale Score 1.  Material does not enter airway: Mildly thick liquids (Level 2, nectar thick); Moderately thick liquids (Level 3, honey thick); Puree; Solid 7.  Material enters airway, passes BELOW cords and not ejected out despite cough attempt by patient: Thin liquids (Level 0) Compensatory Strategies: Compensatory Strategies Compensatory strategies: No   General Information: Caregiver present: No  Diet Prior to this Study: NPO; G-tube   Temperature : Normal   Respiratory Status: WFL   Supplemental O2: None (Room air)   History of Recent Intubation: Yes  Behavior/Cognition: Alert; Cooperative; Requires cueing Self-Feeding Abilities: Needs assist with self-feeding Baseline vocal quality/speech: Normal Volitional Cough: Able to elicit Volitional Swallow: Able to elicit Exam Limitations: No limitations Goal Planning: Prognosis for improved oropharyngeal function: Good Barriers to Reach Goals: Cognitive deficits No data recorded Patient/Family Stated Goal: continued improvement Consulted and agree with results and recommendations: Patient Pain: Pain Assessment Pain Assessment: Faces Faces Pain Scale: 0 End of Session: Start Time:No data recorded Stop Time: No data recorded Time Calculation:No data recorded Charges: No data recorded SLP visit diagnosis: SLP Visit Diagnosis: Dysphagia, oropharyngeal phase (R13.12) Past Medical History: Past Medical History: Diagnosis Date  Anxiety   Back pain   Cancer (HCC)   Depression   Emphysema   Esophageal spasm   Lung nodule   OSA (obstructive sleep apnea)   Osteomalacia   Personal history of radiation therapy   Vertigo   Vitamin D  deficiency  Past Surgical History: Past Surgical History: Procedure Laterality Date  ABDOMINAL HYSTERECTOMY    APPENDECTOMY    BREAST BIOPSY Right  08/31/2020  BREAST EXCISIONAL BIOPSY Left 1978  BREAST LUMPECTOMY Right 09/30/2020  BREAST LUMPECTOMY WITH RADIOACTIVE SEED LOCALIZATION Right 09/30/2020  Procedure: RIGHT BREAST LUMPECTOMY WITH RADIOACTIVE SEED LOCALIZATION;  Surgeon: Curvin Deward MOULD, MD;  Location: [facility];  Service: General;  Laterality: Right;  CARPAL TUNNEL RELEASE    IR CT HEAD LTD  06/25/2024  IR GASTROSTOMY TUBE MOD SED  07/14/2024  IR PERCUTANEOUS ART THROMBECTOMY/INFUSION INTRACRANIAL INC DIAG ANGIO  06/25/2024  RADIOLOGY WITH ANESTHESIA N/A 06/25/2024  Procedure: RADIOLOGY WITH ANESTHESIA;  Surgeon: Dolphus Carrion, MD;  Location: [facility] OR;  Service: Radiology;  Laterality: N/A;  ROTATOR CUFF REPAIR   Rosina DELENA Downy 07/24/2024, 9:53 AM  CT MAXILLOFACIAL WO CONTRAST Result Date: 07/17/2024 CLINICAL DATA:  pain in left jaw/rule out peridontal abscess EXAM: CT MAXILLOFACIAL WITHOUT CONTRAST  TECHNIQUE: Multidetector CT imaging of the maxillofacial structures was performed. Multiplanar CT image reconstructions were also generated. RADIATION DOSE REDUCTION: This exam was performed according to the departmental dose-optimization program which includes automated exposure control, adjustment of the mA and/or kV according to patient size and/or use of iterative reconstruction technique. COMPARISON:  None Available. FINDINGS: Osseous: Patient is missing all of the upper teeth in most of the lower teeth with only a few with tubular teeth remaining. No evidence of periapical abscess or mandibular abnormality. No fracture. Orbits: Choose 1 Sinuses: Clear Soft tissues: Negative Limited intracranial: No significant or unexpected finding. IMPRESSION: No acute mandibular abnormality. No facial or orbital fracture. Electronically Signed   By: Franky Crease M.D.   On: 07/17/2024 17:19   IR GASTROSTOMY TUBE MOD SED Result Date: 07/15/2024 INDICATION: Dysphagia.  Placement of gastric tube for nutritional support EXAM: Percutaneous gastrostomy tube  placement MEDICATIONS: 2 g Ancef  IV; Antibiotics were administered within 1 hour of the procedure. Glucagon  1 mg IV ANESTHESIA/SEDATION: Moderate (conscious) sedation was employed during this procedure. A total of Versed  0.5 mg and Fentanyl  25 mcg was administered intravenously by the radiology nurse. Total intra-service moderate Sedation Time: 16 minutes. The patient's level of consciousness and vital signs were monitored continuously by radiology nursing throughout the procedure under my direct supervision. CONTRAST:  10 mL Omnipaque  300-administered into the gastric lumen. FLUOROSCOPY: Radiation Exposure Index (as provided by the fluoroscopic device): 77.8 mGy Kerma COMPLICATIONS: None immediate. PROCEDURE: Informed written consent was obtained from the patient after a thorough discussion of the procedural risks, benefits and alternatives. All questions were addressed. Maximal Sterile Barrier Technique was utilized including caps, mask, sterile gowns, sterile gloves, sterile drape, hand hygiene and skin antiseptic. A timeout was performed prior to the initiation of the procedure. The patient's epigastric region was evaluated with ultrasound in order to delineate anatomy and marked the patient's lateral margin of the liver. The epigastric region was then prepped and draped in usual sterile fashion. Local anesthesia was achieved with 1% lidocaine  by infiltrating the subcutaneous tissue to the level of the stomach wall. Under fluoroscopic guidance the 2 gastropexy sutures were advanced in sequential fashion by advancing the needle through a small incision until intraluminal position was verified by aspirating stomach air and tripping contrast into the posterior wall of the stomach. Once intraluminal position was verified, the T-fasteners were deployed in the needles were removed. Gastropexy tension was then applied as the 18 gauge needle was advanced through a larger incision into the stomach lumen. An Amplatz wire  was then advanced through the needle and the needle was removed. With the guidewire coiled within the pyloric region of the stomach, balloon and G-tube were advanced through the incision the spanned the soft tissue and the balloon was inflated there by dilating the tract. The balloon was deflated and the 80 [place] G-tube was advanced over the guidewire into the stomach lumen. The retention balloon was inflated. The [facility] disc was closely applied to the patient's skin. Contrast was injected into the stomach verifying position. IMPRESSION: Satisfactory placement of a percutaneous gastrostomy tube and 2 T-fasteners. Electronically Signed   By: Cordella Banner   On: 07/15/2024 10:18   DG Abd 1 View Result Date: 07/14/2024 CLINICAL DATA:  [id number]. Dysphagia. Post contrast through NGT placement. EXAM: ABDOMEN - 1 VIEW COMPARISON:  Study of 06/25/2024. FINDINGS: 4:56 a.m. Current exam excludes the true pelvis. Feeding tube is in place with the radiopaque tip well-positioned in the distal stomach. The  bowel pattern is nonobstructive. Contrast is newly noted throughout the visualized colon, with unremarkable visualized colon. No radiopaque calculi or other significant findings are seen. No supine evidence of free air. There is mild thoracolumbar levoscoliosis with advanced degenerative changes of the thoracic and lumbar spine. Osteopenia. IMPRESSION: 1. Feeding tube tip well-positioned in the distal stomach. 2. Nonobstructive bowel gas pattern. 3. Contrast is newly noted throughout the visualized colon. Electronically Signed   By: Francis Quam M.D.   On: 07/14/2024 07:29   DG Swallowing Func-Speech Pathology Result Date: 07/08/2024 Table formatting from the original result was not included. Modified Barium Swallow Study Patient Details Name: [name] MRN: [id number] Date of Birth: 01-02-1932 Today's Date: 07/08/2024 HPI/PMH: HPI: KAIRI HARSHBARGER is an 89 yo female presenting to ED 7/5 for RUQ pain,  nausea/vomiting. Found to have suspected cholecystitis vs passed choledocholithiasis and acute liver injury, hyponatremia, bacteriuria, and hematuria. Initially seen at [facility] ED 7/3 with n/v and elevated LFTs but left without being seen. Daughter noted sudden onset AMS and L sided weakness 7/9 with CTA showing abrupt occlusion of distal R MCA M1 s/p TNK. Taken to IR with unsuccessful revascularization (repeated reocclusion), post-procedure CTH showed small SAH. Remained intubated after the procedure, 7/9-7/10. PMH includes HLD, PAD, COPD with emphysema, OSA, R breast cancer, back pain, esophageal spasm Clinical Impression: Clinical Impression: Pt exhibits severe oropharyngeal dysphagia, which is considered slightly worse compared to previous MBS 7/11 but is suspected to primarily be impacted by the amount of trials observed today. She was alert and participatory but delays related to initiation continue to result in gross aspiration of thin, nectar, and honey thick liquids most notably after the swallow secondary to reduced control of oral residuals as they progress through the pharynx. While she senses all aspiration and attempts to cough forcefully, it is ineffective at clearing the entire volume of the aspirated bolus (PAS 7). She was able to follow commands to attempt compensatory strategies but a chin tuck posture was ineffective with both thin and nectar thick liquids. Biofeedback was beneficial in cueing her to use a more timely subswallow but this cannot consistently be utilized clinically. There was less residue with purees and no penetration/aspiration. Recommend she remain NPO except ice chips after oral care. Plan to f/u to provide education to pt and her daughters. Will continue following to target ESMT and use of prompt subswallows with trials of puree in an effort to make this an effective way to prevent aspiration with liquids. Factors that may increase risk of adverse event in presence of aspiration  [name] & [name] 2021): Factors that may increase risk of adverse event in presence of aspiration [name] & [name] 2021): Reduced cognitive function; Limited mobility; Presence of tubes (ETT, trach, NG, etc.); Frequent aspiration of large volumes Recommendations/Plan: Swallowing Evaluation Recommendations Swallowing Evaluation Recommendations Recommendations: NPO; Ice chips PRN after oral care Medication Administration: Via alternative means Oral care recommendations: Oral care QID (4x/day); Oral care before ice chips/water  Recommended consults: Consider Palliative care Treatment Plan Treatment Plan Treatment recommendations: Therapy as outlined in treatment plan below Follow-up recommendations: Acute inpatient rehab (3 hours/day) Functional status assessment: Patient has had a recent decline in their functional status and demonstrates the ability to make significant improvements in function in a reasonable and predictable amount of time. Treatment frequency: Min 2x/week Treatment duration: 2 weeks Interventions: Aspiration precaution training; Oropharyngeal exercises; Compensatory techniques; Patient/family education; Trials of upgraded texture/liquids Recommendations Recommendations for follow up therapy are one component of a  multi-disciplinary discharge planning process, led by the attending physician.  Recommendations may be updated based on patient status, additional functional criteria and insurance authorization. Assessment: Orofacial Exam: Orofacial Exam Oral Cavity: Oral Hygiene: WFL Oral Cavity - Dentition: Dentures, not available; Poor condition; Missing dentition Orofacial Anatomy: WFL Oral Motor/Sensory Function: Suspected cranial nerve impairment CN V - Trigeminal: WFL CN VII - Facial: Left motor impairment CN IX - Glossopharyngeal, CN X - Vagus: Left motor impairment CN XII - Hypoglossal: Left motor impairment Anatomy: Anatomy: Suspected cervical osteophytes; Prominent cricopharyngeus Boluses  Administered: Boluses Administered Boluses Administered: Thin liquids (Level 0); Mildly thick liquids (Level 2, nectar thick); Moderately thick liquids (Level 3, honey thick); Puree  Oral Impairment Domain: Oral Impairment Domain Lip Closure: Escape progressing to mid-chin Tongue control during bolus hold: Posterior escape of less than half of bolus Bolus transport/lingual motion: Slow tongue motion Oral residue: Residue collection on oral structures Location of oral residue : Tongue; Palate Initiation of pharyngeal swallow : Pyriform sinuses  Pharyngeal Impairment Domain: Pharyngeal Impairment Domain Soft palate elevation: No bolus between soft palate (SP)/pharyngeal wall (PW) Laryngeal elevation: Complete superior movement of thyroid  cartilage with complete approximation of arytenoids to epiglottic petiole Anterior hyoid excursion: Complete anterior movement Epiglottic movement: Partial inversion Laryngeal vestibule closure: Complete, no air/contrast in laryngeal vestibule Pharyngeal stripping wave : Present - complete Pharyngeal contraction (A/P view only): N/A Pharyngoesophageal segment opening: Partial distention/partial duration, partial obstruction of flow Tongue base retraction: Narrow column of contrast or air between tongue base and PPW Pharyngeal residue: Collection of residue within or on pharyngeal structures Location of pharyngeal residue: Tongue base; Valleculae; Pyriform sinuses  Esophageal Impairment Domain: No data recorded Pill: No data recorded Penetration/Aspiration Scale Score: Penetration/Aspiration Scale Score 1.  Material does not enter airway: Puree 7.  Material enters airway, passes BELOW cords and not ejected out despite cough attempt by patient: Thin liquids (Level 0); Mildly thick liquids (Level 2, nectar thick); Moderately thick liquids (Level 3, honey thick) Compensatory Strategies: Compensatory Strategies Compensatory strategies: Yes Chin tuck: Ineffective Ineffective Chin Tuck: Thin  liquid (Level 0); Mildly thick liquid (Level 2, nectar thick)   General Information: Caregiver present: No  Diet Prior to this Study: NPO; Cortrak/Small bore NG tube   Temperature : Normal   Respiratory Status: WFL   Supplemental O2: None (Room air)   History of Recent Intubation: Yes  Behavior/Cognition: Alert; Cooperative; Requires cueing Self-Feeding Abilities: Needs assist with self-feeding Baseline vocal quality/speech: Dysphonic Volitional Cough: Able to elicit Volitional Swallow: Able to elicit Exam Limitations: No limitations Goal Planning: Prognosis for improved oropharyngeal function: Fair Barriers to Reach Goals: Cognitive deficits; Severity of deficits No data recorded Patient/Family Stated Goal: continued improvement Consulted and agree with results and recommendations: Patient; Physician Pain: Pain Assessment Pain Assessment: Faces Faces Pain Scale: 4 Pain Location: all over Pain Descriptors / Indicators: Aching; Grimacing; Discomfort Pain Intervention(s): Monitored during session End of Session: Start Time:SLP Start Time (ACUTE ONLY): 0957 Stop Time: SLP Stop Time (ACUTE ONLY): 1021 Time Calculation:SLP Time Calculation (min) (ACUTE ONLY): 24 min Charges: SLP Evaluations $ SLP Speech Visit: 1 Visit SLP Evaluations $MBS Swallow: 1 Procedure $Swallowing Treatment: 1 Procedure $Speech Treatment for Individual: 1 Procedure SLP visit diagnosis: SLP Visit Diagnosis: Dysphagia, oropharyngeal phase (R13.12) Past Medical History: Past Medical History: Diagnosis Date  Anxiety   Back pain   Cancer (HCC)   Depression   Emphysema   Esophageal spasm   Lung nodule   OSA (obstructive sleep apnea)   Osteomalacia  Personal history of radiation therapy   Vertigo   Vitamin D  deficiency  Past Surgical History: Past Surgical History: Procedure Laterality Date  ABDOMINAL HYSTERECTOMY    APPENDECTOMY    BREAST BIOPSY Right 08/31/2020  BREAST EXCISIONAL BIOPSY Left 1978  BREAST LUMPECTOMY Right 09/30/2020  BREAST  LUMPECTOMY WITH RADIOACTIVE SEED LOCALIZATION Right 09/30/2020  Procedure: RIGHT BREAST LUMPECTOMY WITH RADIOACTIVE SEED LOCALIZATION;  Surgeon: Curvin Deward MOULD, MD;  Location: [facility];  Service: General;  Laterality: Right;  CARPAL TUNNEL RELEASE    IR CT HEAD LTD  06/25/2024  IR PERCUTANEOUS ART THROMBECTOMY/INFUSION INTRACRANIAL INC DIAG ANGIO  06/25/2024  RADIOLOGY WITH ANESTHESIA N/A 06/25/2024  Procedure: RADIOLOGY WITH ANESTHESIA;  Surgeon: Dolphus Carrion, MD;  Location: [facility];  Service: Radiology;  Laterality: N/A;  ROTATOR CUFF REPAIR   Damien Blumenthal, M.A., CCC-SLP Speech Language Pathology, Acute Rehabilitation Services Secure Chat preferred [phone] 07/08/2024, 11:13 AM   Labs:  Basic Metabolic Panel:    Latest Ref Rng & Units 07/31/2024    4:26 AM 07/28/2024    5:37 AM 07/24/2024    4:51 AM  BMP  Glucose 70 - 99 mg/dL 81  99  95   BUN 8 - 23 mg/dL 16  16  16    Creatinine 0.44 - 1.00 mg/dL 9.38  9.42  9.37   Sodium 135 - 145 mmol/L 138  137  137   Potassium 3.5 - 5.1 mmol/L 3.9  4.2  4.2   Chloride 98 - 111 mmol/L 103  102  100   CO2 22 - 32 mmol/L 26  26  28    Calcium  8.9 - 10.3 mg/dL 9.2  8.9  9.2        Latest Ref Rng & Units 07/31/2024    4:26 AM 07/28/2024    5:37 AM 07/24/2024    4:51 AM  CBC  WBC 4.0 - 10.5 K/uL 6.5  6.4  8.0   Hemoglobin 12.0 - 15.0 g/dL 87.8  88.6  88.4   Hematocrit 36.0 - 46.0 % 36.8  34.0  34.6   Platelets 150 - 400 K/uL 156  146  156      CBC:   CBG: Recent Labs  Lab 07/30/24 1135 07/30/24 1638 07/30/24 2110 07/31/24 0641 07/31/24 1206  GLUCAP 102* 97 113* 93 131*    Brief HPI:   [name] is a 89 y.o. female with history of OSA-intolerant of CPAP, macular degeneration, MDD, COPD/emphysema, right breast cancer who was admitted to [facility] on 06/21/2024 with 3 to 4-day history of RUE abdominal pain with nausea, vomiting and diarrhea.  She was noted to have abnormal LFTs as well as fever with concerns of  possible acalculous cholecystitis.  She was treated with IV fluids and IV antibiotics MRCP 07/06 was negative for CBD stones or acute cholecystitis.  She was noted to have positive UTI and was treated with ceftriaxone  and Flagyl .  She did develop DOE on 07/08 due to fluid overload requiring IV diuresis as well as runs of K for hypokalemia.  On 07/09 she was found to have AMS with left facial droop, right gaze deviation and left-sided weakness with slurred speech.  CTA head/neck showed abrupt occlusion of distal M1 segment of right MCA.  TNK was administered and she underwent cerebral angio with endovascular T1c 1 revascularization of right MCA by Dr. Dolphus.  Use of rescue stent deferred due to concerns of increased risk of intracranial hemorrhage with DAPT.  Postprocedure CT showed  small SAH.  MRI brain revealed evolving right MCA infarct with mild associated petechial blood products right frontoparietal region and small volume acute SAH base of sylvian fissure.  Dr. Jerri felt the stroke was likely due to large vessel disease and on aspirin  given SAH.  She was kept n.p.o. and cortex placed for nutritional support.  MBS done revealed moderate oropharyngeal dysphagia and PEG was discussed with family.  She has also had issues with hallucination, back pain as well as shoulder pain and knee pain as well as sleep-wake disruption.  Tramadol  is added to help with pain management.  Patient with resultant left facial droop with dysarthria, dysphagia, dense LUE greater than LLE weakness with sensory deficits, left hemianopsia and was able to follow one-step command with multimodal cues.  She lives alone and was sedentary however was able to ambulate with use of rolling walker independently.  CIR was recommended due to functional decline.   Hospital Course: GENENE KILMAN was admitted to rehab 07/08/2024 for inpatient therapies to consist of PT, ST and OT at least three hours five days a week. Past admission  physiatrist, therapy team and rehab RN have worked together to provide customized collaborative inpatient rehab.  She was maintained on aspirin  alone initially and speech therapy has been following with repeat s MBS for objective evaluation of swallow function.  Due to severity of dysphagia, IR was consulted and PEG was placed on 7/28.  Dysphagia treatment has been ongoing during her stay and diet was slowly advanced to dysphagia 1, nectar liquids by discharge.  Her p.o. intake is slowly improved grooming and tube feeds have been decreased to 3 times daily as needed p.o. intake less than 50%.  Follow-up CBC showed drop in H&H to 9.9 but this has recovered and is back up to 11.3.  EKG done at admission shows heart rate to be stable.  Mood has been stable on Lexapro  and Wellbutrin  was changed to 75 mg twice daily due to need to crush medications.  Bowel program has been augmented to help manage constipation.  Chronic knee pain has been managed with use of Voltaren  gel or Biofreeze.  She has had issues with neck pain and shoulder pain as well as back spasms.  Lidocaine  patches were used at night in addition to baclofen  as Flexeril  was noted to be sedating.  Follow-up check of lites revealed abnormal LFTs had resolved therefore Tylenol  500 mg p.o. 4 times daily was added.  Plavix  was added after PEG tube was placed however patient did report increase in headaches as well as lethargy and was noted to have significant posterior bias on 08/11.  UA ordered for workup and was negative for UTI. CT head was performed showing multiple new areas of hypoattenuation in right MCA territory concerning for hemorrhagic conversion.  Dr. Rosemarie was consulted for input and recommended discontinuation of Plavix  as patient was almost 3 weeks out.  Repeat CT of head on 08/12 shows stability and no further changes recommended at this time.  Hospital course has also been limited by right jaw and tooth pain.  She was treated with Augmentin   twice daily x 5 days with minimal improvement.  Maxillofacial CT ordered and was negative for infection.  Pain was felt to be due to TMJ tightness and myofascial release performed by Dr. Antonio with improvement in symptoms.  Speech therapy was also advised to change of some of the exercises to avoid irritating TMJ.  Trigger point injections were done to help with  neck and shoulder pain. Her blood pressures were monitored on TID basis and and have been relatively stable.  CBGs were monitored on ACHS checks and have improved off of tube feeds.  Patient has made slow progress but continues to be limited by left-sided weakness, pain as well as fatigue.  She will continue to receive follow-up home health RN, aide,  PT, OT and ST by [facility] after discharge.   Rehab course: During patient's stay in rehab weekly team conferences were held to monitor patient's progress, set goals and discuss barriers to discharge. At admission, patient required total assist with basic ADL tasks and with mobility.She exhibited severe oropharyngeal dysphagia and moderate dysarthria.  Cognition was limited by the fatigue portions of slums test showed deficits in recall, executive function and attention. She  has had improvement in activity tolerance, balance, postural control as well as ability to compensate for deficits. She has had improvement in functional use LUE  and LLE as well as improvement in awareness. She requires mod to max assist for ADL tasks.  She requires max assist for bed mobility and transfers and is feedings to ambulate 8 feet with 2 helpers with tactile cues for initiation, postures as well as sequencing.  Her goals were downgraded due to left-sided weakness as well as ongoing issues with fatigue and pain.  Family education has been completed and they have hired caregivers to assist after discharge.      Discharge disposition: 06-Home-Health Care Svc  Diet: Dysphagia 1, nectar liquids  Special  Instructions: Keep PEG site clean and dry. Offer supplements via PEG between mels and prn if po intake less than 50% 2. Recommend repeat CBC/BMET in 1-2 weeks to monitor H/H and electrolytes,     Discharge Instructions     Ambulatory referral to Neurology   Complete by: As directed    An appointment is requested in approximately: 4 weeks   Ambulatory referral to Physical Medicine Rehab   Complete by: As directed    [place]--hospital follow up      Allergies as of 08/01/2024       Reactions   Adhesive [tape] Itching, Other (See Comments)   Little red itchy bumps develop   Ativan  [lorazepam ] Anxiety, Other (See Comments)   Family states makes her go crazy   Latex Itching, Other (See Comments)   Little red itchy bumps develop        Medication List     STOP taking these medications    cyclobenzaprine  5 MG tablet Commonly known as: FLEXERIL    feeding supplement (PROSource TF20) liquid   lidocaine  5 % ointment Commonly known as: XYLOCAINE  Replaced by: lidocaine  5 %   pantoprazole  40 MG tablet Commonly known as: PROTONIX    tiotropium 18 MCG inhalation capsule Commonly known as: SPIRIVA       TAKE these medications    acetaminophen  650 MG CR tablet Commonly known as: TYLENOL  Take 1 tablet (650 mg total) by mouth 3 (three) times daily. What changed:  when to take this reasons to take this   albuterol  108 (90 Base) MCG/ACT inhaler Commonly known as: VENTOLIN  HFA Inhale 2 puffs into the lungs every 6 (six) hours as needed for wheezing or shortness of breath.   artificial tears ophthalmic solution Place 1 drop into both eyes as needed for dry eyes.   aspirin  EC 81 MG tablet Take 81 mg by mouth daily.   atorvastatin  10 MG tablet Commonly known as: LIPITOR Take 1 tablet (10 mg  total) by mouth at bedtime.   Baclofen  5 MG Tabs Take 1 tablet (5 mg total) by mouth at bedtime.   benzocaine  10 % mucosal gel Commonly known as: ORAJEL Use as directed in the  mouth or throat 2 (two) times daily as needed for mouth pain.   buPROPion  75 MG tablet Commonly known as: WELLBUTRIN  Take 1 tablet (75 mg total) by mouth 2 (two) times daily.   busPIRone  5 MG tablet Commonly known as: BUSPAR  Take 1 tablet (5 mg total) by mouth 3 (three) times daily as needed (Anxiety).   camphor-menthol  lotion Commonly known as: SARNA Apply topically as needed for itching.   Elfolate Plus 3-35-2 MG Tabs Take 1 tablet by mouth 2 (two) times daily.   escitalopram  20 MG tablet Commonly known as: LEXAPRO  Take 2 tablets (40 mg total) by mouth daily.   feeding supplement (OSMOLITE 1.5 CAL) Liqd Place 237 mLs into feeding tube 2 (two) times daily. What changed:  how much to take when to take this   free water  Soln Place 120 mLs into feeding tube 3 (three) times daily. Notes to patient: Use filtered water  or sterile water .    Gerhardt's butt cream Crea Apply 1 Application topically 3 (three) times daily. Notes to patient: Use desitin or zinc oxide.    Geri-kot 8.6 MG tablet Generic drug: senna Take 1 tablet (8.6 mg total) by mouth daily.   lidocaine  5 % Commonly known as: LIDODERM  Place 2 patches onto the skin daily. Remove & Discard patch within 12 hours or as directed by MD Replaces: lidocaine  5 % ointment   melatonin 5 MG Tabs Take 1 tablet (5 mg total) by mouth at bedtime. What changed:  medication strength how much to take how to take this when to take this reasons to take this   Menthol  (Topical Analgesic) 4 % Gel Apply 1 application  topically 4 (four) times daily.   modafinil  100 MG tablet Commonly known as: PROVIGIL  Take 1 tablet (100 mg total) by mouth daily.   mouth rinse Liqd solution 15 mLs by Mouth Rinse route as needed (oral care).   multivitamin with minerals Tabs tablet Take 1 tablet by mouth daily.   nitroGLYCERIN  0.4 MG SL tablet Commonly known as: NITROSTAT  Place 1 tablet (0.4 mg total) under the tongue every 5 (five)  minutes as needed for chest pain.   polyethylene glycol 17 g packet Commonly known as: MIRALAX  / GLYCOLAX  Place 17 g into feeding tube daily as needed for mild constipation.   revefenacin  175 MCG/3ML nebulizer solution Commonly known as: YUPELRI  Take 3 mLs (175 mcg total) by nebulization daily.   traMADol  50 MG tablet Commonly known as: ULTRAM  Take 0.5-1 tablets (25-50 mg total) by mouth every 4 (four) hours as needed for severe pain (pain score 7-10).   Vitamin D  50 MCG (2000 UT) tablet Take 1 tablet (2,000 Units total) by mouth daily. What changed: how much to take   Voltaren  Arthritis Pain 1 % Gel Generic drug: diclofenac  Sodium Apply 2 g topically 4 (four) times daily.        Follow-up Information     [name], MD Follow up.   Specialty: Family Medicine Why: Call in 1-2 days for post hospital follow up Contact information: [address] [place] [address] [phone]         Lorilee Sven SQUIBB, MD Follow up.   Specialty: Physical Medicine and Rehabilitation Why: office will call you with follow up appointment Contact information:  [address] [place] [address] [phone]         [facility] Follow up.   Why: office will call you with follow up appointment Contact information: [address]     [address] [place]  [address] [phone]                Signed: Sharlet GORMAN Schmitz 08/03/2024, 11:53 PM

## 2024-08-03 NOTE — Discharge Summary (Incomplete)
 Physician Discharge Summary  Patient ID: [name] MRN: [id number] DOB/AGE: [birth year] 89 y.o.  Admit date: 07/08/2024 Discharge date: 08/01/2024  Discharge Diagnoses:  Principal Problem:   Acute ischemic right MCA stroke [facility]) Active Problems:   Depression   COPD (chronic obstructive pulmonary disease) (HCC)   Moderate malnutrition (HCC)  Left Jaw pain  Dysphagia  Left hemiplegia  Shoulder pain  Headaches  Back pain   Sleep wake disruption      Discharged Condition: stable  Significant Diagnostic Studies: CT HEAD WO CONTRAST ( ) Result Date: 07/29/2024 CLINICAL DATA:  Headache. Altered mental status. Recent hemorrhagic transformation of stroke. EXAM: CT HEAD WITHOUT CONTRAST TECHNIQUE: Contiguous axial images were obtained from the base of the skull through the vertex without intravenous contrast. RADIATION DOSE REDUCTION: This exam was performed according to the departmental dose-optimization program which includes automated exposure control, adjustment of the mA and/or kV according to patient size and/or use of iterative reconstruction technique. COMPARISON:  Head CT 07/28/2024 FINDINGS: Brain: A large late subacute right MCA infarct is again seen. Associated, primarily gyral hyperdensity within the infarct is unchanged and consistent with hemorrhagic conversion, possibly with an element of mineralization as well. There is no midline shift or other significant mass effect. No definite new infarct or new hemorrhage is identified within limitation of motion artifact. There is a background of moderate chronic small vessel ischemia in the cerebral white matter. There is mild cerebral atrophy. Vascular: Calcified atherosclerosis at the skull base. No hyperdense vessel. Skull: No fracture or suspicious lesion. Sinuses/Orbits: Paranasal sinuses and mastoid air cells are clear. Bilateral cataract extraction. Other: None. IMPRESSION: 1. Unchanged appearance of large subacute right MCA  infarct with hemorrhagic conversion. 2. No evidence of new intracranial abnormality. Electronically Signed   By: Dasie Hamburg M.D.   On: 07/29/2024 11:06   CT HEAD WO CONTRAST ( ) Addendum Date: 07/28/2024 ******** ADDENDUM #1 ******** ADDENDUM: Findings discussed with Dr. Lorilee at 12:53 pm on 07/28/24. ---------------------------------------------------- Electronically signed by: Donnice Mania MD 07/28/2024 12:54 PM EDT RP Workstation: [id number]   Result Date: 07/28/2024 ******** ORIGINAL REPORT ******** EXAM: CT HEAD WITHOUT CONTRAST 07/28/2024 12:01:45 PM TECHNIQUE: CT of the head was performed without the administration of intravenous contrast. Automated exposure control, iterative reconstruction, and/or weight based adjustment of the mA/kV was utilized to reduce the radiation dose to as low as reasonably achievable. COMPARISON: MRI head 06/26/2024. CLINICAL HISTORY: Worsening confusion and left sided lean, recent CVA. F/u post thrombectomy. FINDINGS: BRAIN AND VENTRICLES: There is encephalomalacia within the right MCA (middle cerebral artery) territory compatible with infarct. Multiple new areas of hyperattenuation throughout the right MCA territory primarily involving the cortex within the region of infarct concerning for areas of hemorrhagic conversion. The degree of hemorrhage is greater than would be expected for petechial hemorrhage. The ventricles are unremarkable. No midline shift. The basilar cisterns are patent. Posterior fossa is unremarkable. Similar appearance of prominence of extraaxial spaces over the anterior frontal lobes. ORBITS: Bilateral lens replacement. SINUSES: No acute abnormality. SOFT TISSUES AND SKULL: Atherosclerosis at the skull base involving the carotid siphons. IMPRESSION: 1. Late subacute right MCA territory infarct. 2. Multiple new areas of hyperattenuation throughout the right MCA territory, concerning for hemorrhagic conversion. 3. No midline shift. Electronically  signed by: Donnice Mania MD 07/28/2024 12:41 PM EDT RP Workstation: [id number]   DG Swallowing Func-Speech Pathology Result Date: 07/24/2024 Table formatting from the original result was not included. Modified Barium Swallow Study Patient Details Name: [name] MRN: [id number]  Date of Birth: 01/11/1932 Today's Date: 07/24/2024 HPI/PMH: HPI: [name] is an 89 yo female presenting to ED 7/5 for RUQ pain, nausea/vomiting. Found to have suspected cholecystitis vs passed choledocholithiasis and acute liver injury, hyponatremia, bacteriuria, and hematuria. Initially seen at [facility] ED 7/3 with n/v and elevated LFTs but left without being seen. Daughter noted sudden onset AMS and L sided weakness 7/9 with CTA showing abrupt occlusion of distal R MCA M1 s/p TNK. Taken to IR with unsuccessful revascularization (repeated reocclusion), post-procedure CTH showed small SAH. Remained intubated after the procedure, 7/9-7/10. PMH includes HLD, PAD, COPD with emphysema, OSA, R breast cancer, back pain, esophageal spasm Clinical Impression: Clinical Impression: Pt exhibits markedly improved oropharyngeal dysphagia compared to previous MBS with aspiration only observed with thin liquids due to mild timing deficits of pharyngeal structure movement. With thin liquids from cup, patient took very small sips (often smaller than teaspoon), however when prompted to take a larger sip exhibited transient aspiration where material moved down the posterior wall of the laryngeal vestibule and below the vocal folds, then back out. However, residue remained in the vestibule and was aspirated audibly during oral preparation of solid.  Despite attempts to clear material from the trachea, cough was unsuccessful. No significant penetration/aspiration observed with all other trials and no significant residue observed across consistencies other than noted. Patient grabbed jaw during chewing of Dys3 solids, and given acute jaw pain present since  yesterday, recommend initiation of Dys1/NTL diet to limit chewing. As patient's jaw pain improves, will be able to upgrade diet. Administer medications whole in puree. SLP will continue to follow to target increased cough strength and manage diet upgrade as able. Factors that may increase risk of adverse event in presence of aspiration [name] & [name] 2021): Factors that may increase risk of adverse event in presence of aspiration [name] & [name] 2021): Reduced cognitive function; Limited mobility; Frail or deconditioned Recommendations/Plan: Swallowing Evaluation Recommendations Swallowing Evaluation Recommendations Recommendations: PO diet PO Diet Recommendation: Dysphagia 1 (Pureed); Mildly thick liquids (Level 2, nectar thick) Liquid Administration via: Cup; Spoon; Straw Medication Administration: Whole meds with puree Supervision: Full supervision/cueing for swallowing strategies Swallowing strategies  : Minimize environmental distractions; Slow rate; Small bites/sips Postural changes: Position pt fully upright for meals Oral care recommendations: Oral care BID (2x/day) Caregiver Recommendations: Avoid jello, ice cream, thin soups, popsicles; Remove water  pitcher Treatment Plan Treatment Plan Treatment recommendations: Therapy as outlined in treatment plan below Follow-up recommendations: Home health SLP Functional status assessment: Patient has had a recent decline in their functional status and demonstrates the ability to make significant improvements in function in a reasonable and predictable amount of time. Treatment frequency: Min 2x/week Treatment duration: 2 weeks Interventions: Aspiration precaution training; Oropharyngeal exercises; Compensatory techniques; Patient/family education; Trials of upgraded texture/liquids Recommendations Recommendations for follow up therapy are one component of a multi-disciplinary discharge planning process, led by the attending physician.  Recommendations may be  updated based on patient status, additional functional criteria and insurance authorization. Assessment: Orofacial Exam: Orofacial Exam Oral Cavity: Oral Hygiene: WFL Oral Cavity - Dentition: Dentures, not available; Poor condition; Missing dentition Orofacial Anatomy: WFL Oral Motor/Sensory Function: Suspected cranial nerve impairment CN V - Trigeminal: WFL CN VII - Facial: Left motor impairment CN IX - Glossopharyngeal, CN X - Vagus: Left motor impairment CN XII - Hypoglossal: Left motor impairment Anatomy: Anatomy: Suspected cervical osteophytes; Prominent cricopharyngeus Boluses Administered: Boluses Administered Boluses Administered: Thin liquids (Level 0); Mildly thick liquids (Level 2, nectar thick);  Moderately thick liquids (Level 3, honey thick); Puree; Solid  Oral Impairment Domain: Oral Impairment Domain Lip Closure: No labial escape Tongue control during bolus hold: Cohesive bolus between tongue to palatal seal Bolus preparation/mastication: Slow prolonged chewing/mashing with complete recollection Bolus transport/lingual motion: Slow tongue motion Oral residue: Trace residue lining oral structures Location of oral residue : Tongue; Palate Initiation of pharyngeal swallow : Pyriform sinuses  Pharyngeal Impairment Domain: Pharyngeal Impairment Domain Soft palate elevation: No bolus between soft palate (SP)/pharyngeal wall (PW) Laryngeal elevation: Complete superior movement of thyroid  cartilage with complete approximation of arytenoids to epiglottic petiole Anterior hyoid excursion: Complete anterior movement Epiglottic movement: Complete inversion Laryngeal vestibule closure: Complete, no air/contrast in laryngeal vestibule Pharyngeal stripping wave : Present - complete Pharyngeal contraction (A/P view only): N/A Pharyngoesophageal segment opening: Complete distension and complete duration, no obstruction of flow Tongue base retraction: Trace column of contrast or air between tongue base and PPW  Pharyngeal residue: Trace residue within or on pharyngeal structures Location of pharyngeal residue: Valleculae; Pyriform sinuses  Esophageal Impairment Domain: No data recorded Pill: No data recorded Penetration/Aspiration Scale Score: Penetration/Aspiration Scale Score 1.  Material does not enter airway: Mildly thick liquids (Level 2, nectar thick); Moderately thick liquids (Level 3, honey thick); Puree; Solid 7.  Material enters airway, passes BELOW cords and not ejected out despite cough attempt by patient: Thin liquids (Level 0) Compensatory Strategies: Compensatory Strategies Compensatory strategies: No   General Information: Caregiver present: No  Diet Prior to this Study: NPO; G-tube   Temperature : Normal   Respiratory Status: WFL   Supplemental O2: None (Room air)   History of Recent Intubation: Yes  Behavior/Cognition: Alert; Cooperative; Requires cueing Self-Feeding Abilities: Needs assist with self-feeding Baseline vocal quality/speech: Normal Volitional Cough: Able to elicit Volitional Swallow: Able to elicit Exam Limitations: No limitations Goal Planning: Prognosis for improved oropharyngeal function: Good Barriers to Reach Goals: Cognitive deficits No data recorded Patient/Family Stated Goal: continued improvement Consulted and agree with results and recommendations: Patient Pain: Pain Assessment Pain Assessment: Faces Faces Pain Scale: 0 End of Session: Start Time:No data recorded Stop Time: No data recorded Time Calculation:No data recorded Charges: No data recorded SLP visit diagnosis: SLP Visit Diagnosis: Dysphagia, oropharyngeal phase (R13.12) Past Medical History: Past Medical History: Diagnosis Date . Anxiety  . Back pain  . Cancer (HCC)  . Depression  . Emphysema  . Esophageal spasm  . Lung nodule  . OSA (obstructive sleep apnea)  . Osteomalacia  . Personal history of radiation therapy  . Vertigo  . Vitamin D  deficiency  Past Surgical History: Past Surgical History: Procedure Laterality Date  . ABDOMINAL HYSTERECTOMY   . APPENDECTOMY   . BREAST BIOPSY Right 08/31/2020 . BREAST EXCISIONAL BIOPSY Left 1978 . BREAST LUMPECTOMY Right 09/30/2020 . BREAST LUMPECTOMY WITH RADIOACTIVE SEED LOCALIZATION Right 09/30/2020  Procedure: RIGHT BREAST LUMPECTOMY WITH RADIOACTIVE SEED LOCALIZATION;  Surgeon: Curvin Deward MOULD, MD;  Location: [facility];  Service: General;  Laterality: Right; . CARPAL TUNNEL RELEASE   . IR CT HEAD LTD  06/25/2024 . IR GASTROSTOMY TUBE MOD SED  07/14/2024 . IR PERCUTANEOUS ART THROMBECTOMY/INFUSION INTRACRANIAL INC DIAG ANGIO  06/25/2024 . RADIOLOGY WITH ANESTHESIA N/A 06/25/2024  Procedure: RADIOLOGY WITH ANESTHESIA;  Surgeon: Dolphus Carrion, MD;  Location: [facility] OR;  Service: Radiology;  Laterality: N/A; . ROTATOR CUFF REPAIR   Rosina DELENA Downy 07/24/2024, 9:53 AM  CT MAXILLOFACIAL WO CONTRAST Result Date: 07/17/2024 CLINICAL DATA:  pain in left jaw/rule out peridontal abscess  EXAM: CT MAXILLOFACIAL WITHOUT CONTRAST TECHNIQUE: Multidetector CT imaging of the maxillofacial structures was performed. Multiplanar CT image reconstructions were also generated. RADIATION DOSE REDUCTION: This exam was performed according to the departmental dose-optimization program which includes automated exposure control, adjustment of the mA and/or kV according to patient size and/or use of iterative reconstruction technique. COMPARISON:  None Available. FINDINGS: Osseous: Patient is missing all of the upper teeth in most of the lower teeth with only a few with tubular teeth remaining. No evidence of periapical abscess or mandibular abnormality. No fracture. Orbits: Choose 1 Sinuses: Clear Soft tissues: Negative Limited intracranial: No significant or unexpected finding. IMPRESSION: No acute mandibular abnormality. No facial or orbital fracture. Electronically Signed   By: Franky Crease M.D.   On: 07/17/2024 17:19   IR GASTROSTOMY TUBE MOD SED Result Date: 07/15/2024 INDICATION: Dysphagia.  Placement  of gastric tube for nutritional support EXAM: Percutaneous gastrostomy tube placement MEDICATIONS: 2 g Ancef  IV; Antibiotics were administered within 1 hour of the procedure. Glucagon  1 mg IV ANESTHESIA/SEDATION: Moderate (conscious) sedation was employed during this procedure. A total of Versed  0.5 mg and Fentanyl  25 mcg was administered intravenously by the radiology nurse. Total intra-service moderate Sedation Time: 16 minutes. The patient's level of consciousness and vital signs were monitored continuously by radiology nursing throughout the procedure under my direct supervision. CONTRAST:  10 mL Omnipaque  300-administered into the gastric lumen. FLUOROSCOPY: Radiation Exposure Index (as provided by the fluoroscopic device): 77.8 mGy Kerma COMPLICATIONS: None immediate. PROCEDURE: Informed written consent was obtained from the patient after a thorough discussion of the procedural risks, benefits and alternatives. All questions were addressed. Maximal Sterile Barrier Technique was utilized including caps, mask, sterile gowns, sterile gloves, sterile drape, hand hygiene and skin antiseptic. A timeout was performed prior to the initiation of the procedure. The patient's epigastric region was evaluated with ultrasound in order to delineate anatomy and marked the patient's lateral margin of the liver. The epigastric region was then prepped and draped in usual sterile fashion. Local anesthesia was achieved with 1% lidocaine  by infiltrating the subcutaneous tissue to the level of the stomach wall. Under fluoroscopic guidance the 2 gastropexy sutures were advanced in sequential fashion by advancing the needle through a small incision until intraluminal position was verified by aspirating stomach air and tripping contrast into the posterior wall of the stomach. Once intraluminal position was verified, the T-fasteners were deployed in the needles were removed. Gastropexy tension was then applied as the 18 gauge needle was  advanced through a larger incision into the stomach lumen. An Amplatz wire was then advanced through the needle and the needle was removed. With the guidewire coiled within the pyloric region of the stomach, balloon and G-tube were advanced through the incision the spanned the soft tissue and the balloon was inflated there by dilating the tract. The balloon was deflated and the [address] [place] G-tube was advanced over the guidewire into the stomach lumen. The retention balloon was inflated. The [employer] disc was closely applied to the patient's skin. Contrast was injected into the stomach verifying position. IMPRESSION: Satisfactory placement of a percutaneous gastrostomy tube and 2 T-fasteners. Electronically Signed   By: Cordella Banner   On: 07/15/2024 10:18   DG Abd 1 View Result Date: 07/14/2024 CLINICAL DATA:  [id number]. Dysphagia. Post contrast through NGT placement. EXAM: ABDOMEN - 1 VIEW COMPARISON:  Study of 06/25/2024. FINDINGS: 4:56 a.m. Current exam excludes the true pelvis. Feeding tube is in place with the radiopaque tip well-positioned  in the distal stomach. The bowel pattern is nonobstructive. Contrast is newly noted throughout the visualized colon, with unremarkable visualized colon. No radiopaque calculi or other significant findings are seen. No supine evidence of free air. There is mild thoracolumbar levoscoliosis with advanced degenerative changes of the thoracic and lumbar spine. Osteopenia. IMPRESSION: 1. Feeding tube tip well-positioned in the distal stomach. 2. Nonobstructive bowel gas pattern. 3. Contrast is newly noted throughout the visualized colon. Electronically Signed   By: Francis Quam M.D.   On: 07/14/2024 07:29   DG Swallowing Func-Speech Pathology Result Date: 07/08/2024 Table formatting from the original result was not included. Modified Barium Swallow Study Patient Details Name: [name] MRN: [id number] Date of Birth: 1932/11/22 Today's Date: 07/08/2024 HPI/PMH: HPI: [name] is an 89 yo female presenting to ED 7/5 for RUQ pain, nausea/vomiting. Found to have suspected cholecystitis vs passed choledocholithiasis and acute liver injury, hyponatremia, bacteriuria, and hematuria. Initially seen at [facility] ED 7/3 with n/v and elevated LFTs but left without being seen. Daughter noted sudden onset AMS and L sided weakness 7/9 with CTA showing abrupt occlusion of distal R MCA M1 s/p TNK. Taken to IR with unsuccessful revascularization (repeated reocclusion), post-procedure CTH showed small SAH. Remained intubated after the procedure, 7/9-7/10. PMH includes HLD, PAD, COPD with emphysema, OSA, R breast cancer, back pain, esophageal spasm Clinical Impression: Clinical Impression: Pt exhibits severe oropharyngeal dysphagia, which is considered slightly worse compared to previous MBS 7/11 but is suspected to primarily be impacted by the amount of trials observed today. She was alert and participatory but delays related to initiation continue to result in gross aspiration of thin, nectar, and honey thick liquids most notably after the swallow secondary to reduced control of oral residuals as they progress through the pharynx. While she senses all aspiration and attempts to cough forcefully, it is ineffective at clearing the entire volume of the aspirated bolus (PAS 7). She was able to follow commands to attempt compensatory strategies but a chin tuck posture was ineffective with both thin and nectar thick liquids. Biofeedback was beneficial in cueing her to use a more timely subswallow but this cannot consistently be utilized clinically. There was less residue with purees and no penetration/aspiration. Recommend she remain NPO except ice chips after oral care. Plan to f/u to provide education to pt and her daughters. Will continue following to target ESMT and use of prompt subswallows with trials of puree in an effort to make this an effective way to prevent aspiration with liquids. Factors that  may increase risk of adverse event in presence of aspiration [name] & [name] 2021): Factors that may increase risk of adverse event in presence of aspiration [name] & [name] 2021): Reduced cognitive function; Limited mobility; Presence of tubes (ETT, trach, NG, etc.); Frequent aspiration of large volumes Recommendations/Plan: Swallowing Evaluation Recommendations Swallowing Evaluation Recommendations Recommendations: NPO; Ice chips PRN after oral care Medication Administration: Via alternative means Oral care recommendations: Oral care QID (4x/day); Oral care before ice chips/water  Recommended consults: Consider Palliative care Treatment Plan Treatment Plan Treatment recommendations: Therapy as outlined in treatment plan below Follow-up recommendations: Acute inpatient rehab (3 hours/day) Functional status assessment: Patient has had a recent decline in their functional status and demonstrates the ability to make significant improvements in function in a reasonable and predictable amount of time. Treatment frequency: Min 2x/week Treatment duration: 2 weeks Interventions: Aspiration precaution training; Oropharyngeal exercises; Compensatory techniques; Patient/family education; Trials of upgraded texture/liquids Recommendations Recommendations for follow up therapy  are one component of a multi-disciplinary discharge planning process, led by the attending physician.  Recommendations may be updated based on patient status, additional functional criteria and insurance authorization. Assessment: Orofacial Exam: Orofacial Exam Oral Cavity: Oral Hygiene: WFL Oral Cavity - Dentition: Dentures, not available; Poor condition; Missing dentition Orofacial Anatomy: WFL Oral Motor/Sensory Function: Suspected cranial nerve impairment CN V - Trigeminal: WFL CN VII - Facial: Left motor impairment CN IX - Glossopharyngeal, CN X - Vagus: Left motor impairment CN XII - Hypoglossal: Left motor impairment Anatomy: Anatomy: Suspected  cervical osteophytes; Prominent cricopharyngeus Boluses Administered: Boluses Administered Boluses Administered: Thin liquids (Level 0); Mildly thick liquids (Level 2, nectar thick); Moderately thick liquids (Level 3, honey thick); Puree  Oral Impairment Domain: Oral Impairment Domain Lip Closure: Escape progressing to mid-chin Tongue control during bolus hold: Posterior escape of less than half of bolus Bolus transport/lingual motion: Slow tongue motion Oral residue: Residue collection on oral structures Location of oral residue : Tongue; Palate Initiation of pharyngeal swallow : Pyriform sinuses  Pharyngeal Impairment Domain: Pharyngeal Impairment Domain Soft palate elevation: No bolus between soft palate (SP)/pharyngeal wall (PW) Laryngeal elevation: Complete superior movement of thyroid  cartilage with complete approximation of arytenoids to epiglottic petiole Anterior hyoid excursion: Complete anterior movement Epiglottic movement: Partial inversion Laryngeal vestibule closure: Complete, no air/contrast in laryngeal vestibule Pharyngeal stripping wave : Present - complete Pharyngeal contraction (A/P view only): N/A Pharyngoesophageal segment opening: Partial distention/partial duration, partial obstruction of flow Tongue base retraction: Narrow column of contrast or air between tongue base and PPW Pharyngeal residue: Collection of residue within or on pharyngeal structures Location of pharyngeal residue: Tongue base; Valleculae; Pyriform sinuses  Esophageal Impairment Domain: No data recorded Pill: No data recorded Penetration/Aspiration Scale Score: Penetration/Aspiration Scale Score 1.  Material does not enter airway: Puree 7.  Material enters airway, passes BELOW cords and not ejected out despite cough attempt by patient: Thin liquids (Level 0); Mildly thick liquids (Level 2, nectar thick); Moderately thick liquids (Level 3, honey thick) Compensatory Strategies: Compensatory Strategies Compensatory  strategies: Yes Chin tuck: Ineffective Ineffective Chin Tuck: Thin liquid (Level 0); Mildly thick liquid (Level 2, nectar thick)   General Information: Caregiver present: No  Diet Prior to this Study: NPO; Cortrak/Small bore NG tube   Temperature : Normal   Respiratory Status: WFL   Supplemental O2: None (Room air)   History of Recent Intubation: Yes  Behavior/Cognition: Alert; Cooperative; Requires cueing Self-Feeding Abilities: Needs assist with self-feeding Baseline vocal quality/speech: Dysphonic Volitional Cough: Able to elicit Volitional Swallow: Able to elicit Exam Limitations: No limitations Goal Planning: Prognosis for improved oropharyngeal function: Fair Barriers to Reach Goals: Cognitive deficits; Severity of deficits No data recorded Patient/Family Stated Goal: continued improvement Consulted and agree with results and recommendations: Patient; Physician Pain: Pain Assessment Pain Assessment: Faces Faces Pain Scale: 4 Pain Location: all over Pain Descriptors / Indicators: Aching; Grimacing; Discomfort Pain Intervention(s): Monitored during session End of Session: Start Time:SLP Start Time (ACUTE ONLY): 0957 Stop Time: SLP Stop Time (ACUTE ONLY): 1021 Time Calculation:SLP Time Calculation (min) (ACUTE ONLY): 24 min Charges: SLP Evaluations $ SLP Speech Visit: 1 Visit SLP Evaluations $MBS Swallow: 1 Procedure $Swallowing Treatment: 1 Procedure $Speech Treatment for Individual: 1 Procedure SLP visit diagnosis: SLP Visit Diagnosis: Dysphagia, oropharyngeal phase (R13.12) Past Medical History: Past Medical History: Diagnosis Date . Anxiety  . Back pain  . Cancer (HCC)  . Depression  . Emphysema  . Esophageal spasm  . Lung nodule  . OSA (obstructive  sleep apnea)  . Osteomalacia  . Personal history of radiation therapy  . Vertigo  . Vitamin D  deficiency  Past Surgical History: Past Surgical History: Procedure Laterality Date . ABDOMINAL HYSTERECTOMY   . APPENDECTOMY   . BREAST BIOPSY Right 08/31/2020 .  BREAST EXCISIONAL BIOPSY Left 1978 . BREAST LUMPECTOMY Right 09/30/2020 . BREAST LUMPECTOMY WITH RADIOACTIVE SEED LOCALIZATION Right 09/30/2020  Procedure: RIGHT BREAST LUMPECTOMY WITH RADIOACTIVE SEED LOCALIZATION;  Surgeon: Curvin Deward MOULD, MD;  Location: [facility];  Service: General;  Laterality: Right; . CARPAL TUNNEL RELEASE   . IR CT HEAD LTD  06/25/2024 . IR PERCUTANEOUS ART THROMBECTOMY/INFUSION INTRACRANIAL INC DIAG ANGIO  06/25/2024 . RADIOLOGY WITH ANESTHESIA N/A 06/25/2024  Procedure: RADIOLOGY WITH ANESTHESIA;  Surgeon: Dolphus Carrion, MD;  Location: [facility] OR;  Service: Radiology;  Laterality: N/A; . ROTATOR CUFF REPAIR   Damien Blumenthal, M.A., CCC-SLP Speech Language Pathology, Acute Rehabilitation Services Secure Chat preferred [phone] 07/08/2024, 11:13 AM   Labs:  Basic Metabolic Panel:    Latest Ref Rng & Units 07/31/2024    4:26 AM 07/28/2024    5:37 AM 07/24/2024    4:51 AM  BMP  Glucose 70 - 99 mg/dL 81  99  95   BUN 8 - 23 mg/dL 16  16  16    Creatinine 0.44 - 1.00 mg/dL 9.38  9.42  9.37   Sodium 135 - 145 mmol/L 138  137  137   Potassium 3.5 - 5.1 mmol/L 3.9  4.2  4.2   Chloride 98 - 111 mmol/L 103  102  100   CO2 22 - 32 mmol/L 26  26  28    Calcium  8.9 - 10.3 mg/dL 9.2  8.9  9.2        Latest Ref Rng & Units 07/31/2024    4:26 AM 07/28/2024    5:37 AM 07/24/2024    4:51 AM  CBC  WBC 4.0 - 10.5 K/uL 6.5  6.4  8.0   Hemoglobin 12.0 - 15.0 g/dL 87.8  88.6  88.4   Hematocrit 36.0 - 46.0 % 36.8  34.0  34.6   Platelets 150 - 400 K/uL 156  146  156      CBC:   CBG: Recent Labs  Lab 07/30/24 1135 07/30/24 1638 07/30/24 2110 07/31/24 0641 07/31/24 1206  GLUCAP 102* 97 113* 93 131*    Brief HPI:   DAKSHA KOONE is a 89 y.o. female with history of OSA-intolerant of CPAP, macular degeneration, MDD, COPD/emphysema, right breast cancer who was admitted to [facility] on 06/21/2024 with 3 to 4-day history of RUE abdominal pain with nausea, vomiting  and diarrhea.  She was noted to have abnormal LFTs as well as fever with concerns of possible acalculous cholecystitis.  She was treated with IV fluids and IV antibiotics MRCP 07/06 was negative for CBD stones or acute cholecystitis.  She was noted to have positive UTI and was treated with ceftriaxone  and Flagyl .  She did develop DOE on 07/08 due to fluid overload requiring IV diuresis as well as runs of K for hypokalemia.  On 07/09 she was found to have AMS with left facial droop, right gaze deviation and left-sided weakness with slurred speech.  CTA head/neck showed abrupt occlusion of distal M1 segment of right MCA.  TNK was administered and she underwent cerebral angio with endovascular T1c 1 revascularization of right MCA by Dr. Dolphus.  Use of rescue stent deferred due to concerns of increased risk of intracranial  hemorrhage with DAPT.  Postprocedure CT showed small SAH.  MRI brain revealed evolving right MCA infarct with mild associated petechial blood products right frontoparietal region and small volume acute SAH base of sylvian fissure.  Dr. Jerri felt the stroke was likely due to large vessel disease and on aspirin  given SAH.  She was kept n.p.o. and cortex placed for nutritional support.  MBS done revealed moderate oropharyngeal dysphagia and PEG was discussed with family.  She has also had issues with hallucination, back pain as well as shoulder pain and knee pain as well as sleep-wake disruption.  Tramadol  is added to help with pain management.  Patient with resultant left facial droop with dysarthria, dysphagia, dense LUE greater than LLE weakness with sensory deficits, left hemianopsia and was able to follow one-step command with multimodal cues.  She lives alone and was sedentary however was able to ambulate with use of rolling walker independently.  CIR was recommended due to functional decline.   Hospital Course: [name] was admitted to rehab 07/08/2024 for inpatient therapies to  consist of PT, ST and OT at least three hours five days a week. Past admission physiatrist, therapy team and rehab RN have worked together to provide customized collaborative inpatient rehab.  She was maintained on aspirin  alone initially and speech therapy has been following with repeat s MBS for objective evaluation of swallow function.  Due to severity of dysphagia, IR was consulted and PEG was placed on 7/28.  Dysphagia treatment has been ongoing during her stay and diet was slowly advanced to dysphagia 1, nectar liquids by discharge.  Her p.o. intake is slowly improved grooming and tube feeds have been decreased to 3 times daily as needed p.o. intake less than 50%.  Follow-up CBC showed drop in H&H to 9.9 but this has recovered and is back up to 11.3.  EKG done at admission shows heart rate to be stable.  Mood has been stable on Lexapro  and Wellbutrin  was changed to 75 mg twice daily due to need to crush medications.  Bowel program has been augmented to help manage constipation.  Chronic knee pain has been managed with use of Voltaren  gel or Biofreeze.  She has had issues with neck pain and shoulder pain as well as back spasms.  Lidocaine  patches were used at night in addition to baclofen  as Flexeril  was noted to be sedating.  Follow-up check of lites revealed abnormal LFTs had resolved therefore Tylenol  500 mg p.o. 4 times daily was added.  Plavix  was added after PEG tube was placed however patient did report increase in headaches as well as lethargy and was noted to have significant posterior bias on 08/11.  UA ordered for workup and was negative for UTI. CT head was performed showing multiple new areas of hypoattenuation in right MCA territory concerning for hemorrhagic conversion.  Dr. Rosemarie was consulted for input and recommended discontinuation of Plavix  as patient was almost 3 weeks out.  Repeat CT of head on 08/12 shows stability and no further changes recommended at this time.  Hospital course has  also been limited by right jaw and tooth pain.  She was treated with Augmentin  twice daily x 5 days with minimal improvement.  Maxillofacial CT ordered and was negative for infection.  Pain was felt to be due to TMJ tightness and myofascial release performed by Dr. Antonio with improvement in symptoms.  Speech therapy was also advised to change of some of the exercises to avoid irritating TMJ.  Trigger  point injections were done to help with neck and shoulder pain. Her blood pressures were monitored on TID basis and and have been relatively stable.  CBGs were monitored on ACHS checks and have improved off of tube feeds.  Patient has made slow progress but continues to be limited by left-sided weakness, pain as well as fatigue.  She will continue to receive follow-up home health RN, aide,  PT, OT and ST by [facility] after discharge.   Rehab course: During patient's stay in rehab weekly team conferences were held to monitor patient's progress, set goals and discuss barriers to discharge. At admission, patient required total assist with basic ADL tasks and with mobility.She exhibited severe oropharyngeal dysphagia and moderate dysarthria.  Cognition was limited by the fatigue portions of slums test showed deficits in recall, executive function and attention. She  has had improvement in activity tolerance, balance, postural control as well as ability to compensate for deficits. She has had improvement in functional use LUE  and LLE as well as improvement in awareness. She requires mod to max assist for ADL tasks.  She requires max assist for bed mobility and transfers and is feedings to ambulate 8 feet with 2 helpers with tactile cues for initiation, postures as well as sequencing.  Her goals were downgraded due to left-sided weakness as well as ongoing issues with fatigue and pain.  Family education has been completed and they have hired caregivers to assist after discharge.      Discharge  disposition: 06-Home-Health Care Svc   Diet:  Special Instructions:  Discharge Instructions     Ambulatory referral to Neurology   Complete by: As directed    An appointment is requested in approximately: 4 weeks   Ambulatory referral to Physical Medicine Rehab   Complete by: As directed    [place]--hospital follow up      Allergies as of 08/01/2024       Reactions   Adhesive [tape] Itching, Other (See Comments)   Little red itchy bumps develop   Ativan  [lorazepam ] Anxiety, Other (See Comments)   Family states makes her go crazy   Latex Itching, Other (See Comments)   Little red itchy bumps develop        Medication List     STOP taking these medications    cyclobenzaprine  5 MG tablet Commonly known as: FLEXERIL    feeding supplement (PROSource TF20) liquid   lidocaine  5 % ointment Commonly known as: XYLOCAINE  Replaced by: lidocaine  5 %   pantoprazole  40 MG tablet Commonly known as: PROTONIX    tiotropium 18 MCG inhalation capsule Commonly known as: SPIRIVA       TAKE these medications    acetaminophen  650 MG CR tablet Commonly known as: TYLENOL  Take 1 tablet (650 mg total) by mouth 3 (three) times daily. What changed:  when to take this reasons to take this   albuterol  108 (90 Base) MCG/ACT inhaler Commonly known as: VENTOLIN  HFA Inhale 2 puffs into the lungs every 6 (six) hours as needed for wheezing or shortness of breath.   artificial tears ophthalmic solution Place 1 drop into both eyes as needed for dry eyes.   aspirin  EC 81 MG tablet Take 81 mg by mouth daily.   atorvastatin  10 MG tablet Commonly known as: LIPITOR Take 1 tablet (10 mg total) by mouth at bedtime.   Baclofen  5 MG Tabs Take 1 tablet (5 mg total) by mouth at bedtime.   benzocaine  10 % mucosal gel Commonly known as:  ORAJEL Use as directed in the mouth or throat 2 (two) times daily as needed for mouth pain.   buPROPion  75 MG tablet Commonly known as: WELLBUTRIN  Take  1 tablet (75 mg total) by mouth 2 (two) times daily.   busPIRone  5 MG tablet Commonly known as: BUSPAR  Take 1 tablet (5 mg total) by mouth 3 (three) times daily as needed (Anxiety).   camphor-menthol  lotion Commonly known as: SARNA Apply topically as needed for itching.   Elfolate Plus 3-35-2 MG Tabs Take 1 tablet by mouth 2 (two) times daily.   escitalopram  20 MG tablet Commonly known as: LEXAPRO  Take 2 tablets (40 mg total) by mouth daily.   feeding supplement (OSMOLITE 1.5 CAL) Liqd Place 237 mLs into feeding tube 2 (two) times daily. What changed:  how much to take when to take this   free water  Soln Place 120 mLs into feeding tube 3 (three) times daily. Notes to patient: Use filtered water  or sterile water .    Gerhardt's butt cream Crea Apply 1 Application topically 3 (three) times daily. Notes to patient: Use desitin or zinc oxide.    Geri-kot 8.6 MG tablet Generic drug: senna Take 1 tablet (8.6 mg total) by mouth daily.   lidocaine  5 % Commonly known as: LIDODERM  Place 2 patches onto the skin daily. Remove & Discard patch within 12 hours or as directed by MD Replaces: lidocaine  5 % ointment   melatonin 5 MG Tabs Take 1 tablet (5 mg total) by mouth at bedtime. What changed:  medication strength how much to take how to take this when to take this reasons to take this   Menthol  (Topical Analgesic) 4 % Gel Apply 1 application  topically 4 (four) times daily.   modafinil  100 MG tablet Commonly known as: PROVIGIL  Take 1 tablet (100 mg total) by mouth daily.   mouth rinse Liqd solution 15 mLs by Mouth Rinse route as needed (oral care).   multivitamin with minerals Tabs tablet Take 1 tablet by mouth daily.   nitroGLYCERIN  0.4 MG SL tablet Commonly known as: NITROSTAT  Place 1 tablet (0.4 mg total) under the tongue every 5 (five) minutes as needed for chest pain.   polyethylene glycol 17 g packet Commonly known as: MIRALAX  / GLYCOLAX  Place 17 g into  feeding tube daily as needed for mild constipation.   revefenacin  175 MCG/3ML nebulizer solution Commonly known as: YUPELRI  Take 3 mLs (175 mcg total) by nebulization daily.   traMADol  50 MG tablet Commonly known as: ULTRAM  Take 0.5-1 tablets (25-50 mg total) by mouth every 4 (four) hours as needed for severe pain (pain score 7-10).   Vitamin D  50 MCG (2000 UT) tablet Take 1 tablet (2,000 Units total) by mouth daily. What changed: how much to take   Voltaren  Arthritis Pain 1 % Gel Generic drug: diclofenac  Sodium Apply 2 g topically 4 (four) times daily.        Follow-up Information     [name], MD Follow up.   Specialty: Family Medicine Why: Call in 1-2 days for post hospital follow up Contact information: [address] [place] [address] [phone]         Lorilee Sven SQUIBB, MD Follow up.   Specialty: Physical Medicine and Rehabilitation Why: office will call you with follow up appointment Contact information: [address] [place] [address] [phone]         [facility] Follow up.   Why: office will call you with  follow up appointment Contact information: [address]     [address] [place]  [address] [phone]                Signed: Sharlet GORMAN Schmitz 08/03/2024, 11:53 PM

## 2024-08-05 DIAGNOSIS — F329 Major depressive disorder, single episode, unspecified: Secondary | ICD-10-CM | POA: Diagnosis not present

## 2024-08-05 DIAGNOSIS — Z431 Encounter for attention to gastrostomy: Secondary | ICD-10-CM | POA: Diagnosis not present

## 2024-08-05 DIAGNOSIS — E440 Moderate protein-calorie malnutrition: Secondary | ICD-10-CM | POA: Diagnosis not present

## 2024-08-05 DIAGNOSIS — B9620 Unspecified Escherichia coli [E. coli] as the cause of diseases classified elsewhere: Secondary | ICD-10-CM | POA: Diagnosis not present

## 2024-08-05 DIAGNOSIS — F419 Anxiety disorder, unspecified: Secondary | ICD-10-CM | POA: Diagnosis not present

## 2024-08-05 DIAGNOSIS — N390 Urinary tract infection, site not specified: Secondary | ICD-10-CM | POA: Diagnosis not present

## 2024-08-05 DIAGNOSIS — I69354 Hemiplegia and hemiparesis following cerebral infarction affecting left non-dominant side: Secondary | ICD-10-CM | POA: Diagnosis not present

## 2024-08-05 DIAGNOSIS — E1151 Type 2 diabetes mellitus with diabetic peripheral angiopathy without gangrene: Secondary | ICD-10-CM | POA: Diagnosis not present

## 2024-08-05 DIAGNOSIS — J439 Emphysema, unspecified: Secondary | ICD-10-CM | POA: Diagnosis not present

## 2024-08-07 ENCOUNTER — Telehealth: Payer: Self-pay

## 2024-08-07 ENCOUNTER — Other Ambulatory Visit: Payer: Self-pay | Admitting: Physical Medicine and Rehabilitation

## 2024-08-07 MED ORDER — ARFORMOTEROL TARTRATE 15 MCG/2ML IN NEBU: 15.0000 ug | INHALATION_SOLUTION | Freq: Two times a day (BID) | RESPIRATORY_TRACT | 3 refills | Status: AC

## 2024-08-07 MED ORDER — BACLOFEN 5 MG PO TABS: 5.0000 mg | ORAL_TABLET | Freq: Two times a day (BID) | ORAL | 0 refills | Status: DC | PRN

## 2024-08-07 MED ORDER — ARFORMOTEROL TARTRATE 15 MCG/2ML IN NEBU: 15.0000 ug | INHALATION_SOLUTION | Freq: Four times a day (QID) | RESPIRATORY_TRACT | 3 refills | Status: DC | PRN

## 2024-08-07 NOTE — Telephone Encounter (Signed)
 PA submitted for [employer] .  Medicare D denied  coverage however, approval rec'd from Medicare Part B. Submitted pon Cover my Meds  Key:  [id number],  approval valid from 08/05/2024 - 13/31/2025  Medicare Part B.  Approval labeled and to be scanned in chart under media tab.

## 2024-08-07 NOTE — Telephone Encounter (Signed)
 [employer] nurse called with pharmacist recommendations  I-methylfolate-B6-B12 3-35-2 mg is $98  Maybe a vit B complex may work in its place and be cheaper Baclofen  once a bedtime, possible increase frequency, patient still having spasms on current dose. Nebulizer solution, LAMA and LABA med. Need long acting,  Brovana  would work  Call daughter [name] Console at [phone] if any changes.

## 2024-08-08 ENCOUNTER — Telehealth: Payer: Self-pay | Admitting: Neurology

## 2024-08-08 NOTE — Telephone Encounter (Signed)
 Pt daughter called to see if PT can be seen befefore the 15c  due to Transportation . Pt daughter states Pt will not be able to come in after  10-14 -25

## 2024-08-11 DIAGNOSIS — R1319 Other dysphagia: Secondary | ICD-10-CM | POA: Diagnosis not present

## 2024-08-11 DIAGNOSIS — M79609 Pain in unspecified limb: Secondary | ICD-10-CM | POA: Diagnosis not present

## 2024-08-11 DIAGNOSIS — K648 Other hemorrhoids: Secondary | ICD-10-CM | POA: Diagnosis not present

## 2024-08-11 DIAGNOSIS — M62838 Other muscle spasm: Secondary | ICD-10-CM | POA: Diagnosis not present

## 2024-08-11 DIAGNOSIS — Z6826 Body mass index (BMI) 26.0-26.9, adult: Secondary | ICD-10-CM | POA: Diagnosis not present

## 2024-08-11 DIAGNOSIS — F332 Major depressive disorder, recurrent severe without psychotic features: Secondary | ICD-10-CM | POA: Diagnosis not present

## 2024-08-11 DIAGNOSIS — J449 Chronic obstructive pulmonary disease, unspecified: Secondary | ICD-10-CM | POA: Diagnosis not present

## 2024-08-11 DIAGNOSIS — Z8673 Personal history of transient ischemic attack (TIA), and cerebral infarction without residual deficits: Secondary | ICD-10-CM | POA: Diagnosis not present

## 2024-08-11 NOTE — Telephone Encounter (Signed)
 Call to daughter, patients [employer] transportation benefit ends 10/01/24. Dr. Onita had opening 8/28, daughter in agreement.

## 2024-08-12 DIAGNOSIS — Z431 Encounter for attention to gastrostomy: Secondary | ICD-10-CM | POA: Diagnosis not present

## 2024-08-12 DIAGNOSIS — J439 Emphysema, unspecified: Secondary | ICD-10-CM | POA: Diagnosis not present

## 2024-08-12 DIAGNOSIS — E1151 Type 2 diabetes mellitus with diabetic peripheral angiopathy without gangrene: Secondary | ICD-10-CM | POA: Diagnosis not present

## 2024-08-12 DIAGNOSIS — I69354 Hemiplegia and hemiparesis following cerebral infarction affecting left non-dominant side: Secondary | ICD-10-CM | POA: Diagnosis not present

## 2024-08-12 DIAGNOSIS — N390 Urinary tract infection, site not specified: Secondary | ICD-10-CM | POA: Diagnosis not present

## 2024-08-12 DIAGNOSIS — F419 Anxiety disorder, unspecified: Secondary | ICD-10-CM | POA: Diagnosis not present

## 2024-08-12 DIAGNOSIS — F329 Major depressive disorder, single episode, unspecified: Secondary | ICD-10-CM | POA: Diagnosis not present

## 2024-08-12 DIAGNOSIS — E440 Moderate protein-calorie malnutrition: Secondary | ICD-10-CM | POA: Diagnosis not present

## 2024-08-12 DIAGNOSIS — J449 Chronic obstructive pulmonary disease, unspecified: Secondary | ICD-10-CM | POA: Diagnosis not present

## 2024-08-12 DIAGNOSIS — B9620 Unspecified Escherichia coli [E. coli] as the cause of diseases classified elsewhere: Secondary | ICD-10-CM | POA: Diagnosis not present

## 2024-08-14 ENCOUNTER — Encounter: Payer: Self-pay | Admitting: Neurology

## 2024-08-14 ENCOUNTER — Ambulatory Visit: Admitting: Neurology

## 2024-08-14 VITALS — BP 127/76 | HR 75 | Ht 61.0 in

## 2024-08-14 DIAGNOSIS — N390 Urinary tract infection, site not specified: Secondary | ICD-10-CM | POA: Diagnosis not present

## 2024-08-14 DIAGNOSIS — E1151 Type 2 diabetes mellitus with diabetic peripheral angiopathy without gangrene: Secondary | ICD-10-CM | POA: Diagnosis not present

## 2024-08-14 DIAGNOSIS — I63511 Cerebral infarction due to unspecified occlusion or stenosis of right middle cerebral artery: Secondary | ICD-10-CM

## 2024-08-14 DIAGNOSIS — R519 Headache, unspecified: Secondary | ICD-10-CM

## 2024-08-14 DIAGNOSIS — Z431 Encounter for attention to gastrostomy: Secondary | ICD-10-CM | POA: Diagnosis not present

## 2024-08-14 DIAGNOSIS — J439 Emphysema, unspecified: Secondary | ICD-10-CM | POA: Diagnosis not present

## 2024-08-14 DIAGNOSIS — I69354 Hemiplegia and hemiparesis following cerebral infarction affecting left non-dominant side: Secondary | ICD-10-CM

## 2024-08-14 DIAGNOSIS — B9620 Unspecified Escherichia coli [E. coli] as the cause of diseases classified elsewhere: Secondary | ICD-10-CM | POA: Diagnosis not present

## 2024-08-14 DIAGNOSIS — F329 Major depressive disorder, single episode, unspecified: Secondary | ICD-10-CM | POA: Diagnosis not present

## 2024-08-14 DIAGNOSIS — E440 Moderate protein-calorie malnutrition: Secondary | ICD-10-CM | POA: Diagnosis not present

## 2024-08-14 DIAGNOSIS — F419 Anxiety disorder, unspecified: Secondary | ICD-10-CM | POA: Diagnosis not present

## 2024-08-14 MED ORDER — GABAPENTIN 300 MG PO CAPS
300.0000 mg | ORAL_CAPSULE | Freq: Three times a day (TID) | ORAL | 11 refills | Status: DC | PRN
Start: 2024-08-14 — End: 2024-10-24

## 2024-08-14 MED ORDER — MODAFINIL 100 MG PO TABS: 100.0000 mg | ORAL_TABLET | Freq: Every day | ORAL | 3 refills | Status: DC

## 2024-08-14 NOTE — Progress Notes (Signed)
 Chief Complaint  Patient presents with   Hospitalization Follow-up    RM 15 stroke   ASSESSMENT AND PLAN  [name] is a 89 y.o. female   History of large right MCA stroke in July 2024, with residual severe left hemiparesis  On aspirin  81 mg daily,  Modafinil  100 mg daily, did help to keep her alert  Gabapentin  300 mg 3 times daily for left body/neck pain,  Continue physical therapy,  Follow-up with primary care  DIAGNOSTIC DATA (LABS, IMAGING, TESTING) - I reviewed patient records, labs, notes, testing and imaging myself where available.   MEDICAL HISTORY:  [name] is a 89 year old female, accompanied by her daughter, seen in request by her primary care from [facility] physician Dr. [name], to follow-up for stroke,  History is obtained from the patient and review of electronic medical records. I personally reviewed pertinent available imaging films in PACS.   PMHx of  Depression, anxiety. OSA- Right breast cancer, s/p lobectomy in 2022, chemo/radiation Vit D deficiency Hx of right rotator cuff surgery.  She was admitted to the hospital for cholecystitis, E. coli bacteremia, treated and responded well to antibiotic treatment, on June 27, 2024, developed sudden onset left-sided weakness, with right gaze deviation, dysarthria, she had a right MCA stroke with right M1 occlusion, was treated with TNK, attempted interventional for mechanical thrombectomy with TICI 2B revascularization, opt procedure, she was found to have small right perisylvian subarachnoid hemorrhage, recurrent proximal right M2 occlusion,  Echocardiogram normal ejection fraction 60 to 65%, mild atrial dilation,  LDL 45, A1c 5.1, she was on aspirin  prior to admission, remained on aspirin  81,  Personally reviewed multiple imaging including most recent CT scan July 29, 2024, large subacute right MCA infarction with hemorrhagic conversion,  MRI/A of the brain June 27, 2024,  persistent recurrent proximal right M2 occlusion, large right MCA infarction, with petechiae blood product at the right frontal parietal region, small volume subarachnoid hemorrhage at the base of the right sylvian fissure, underlying cerebral atrophy with moderate small vessel disease  She has severe left hemiparesis, hemineglect, status post PEG tube replacement, now discharged home, daughter lives with her, is the main caregiver,  She can still carry on a conversation, complains of severe left neck pain with movement, limited her physical therapy  PHYSICAL EXAM:   Vitals:   08/14/24 1059  BP: 127/76  Pulse: 75  SpO2: 95%  Height: 5' 1 (1.549 m)   Not recorded     Body mass index is 26.24 kg/m.  PHYSICAL EXAMNIATION:  Gen: NAD, conversant, well nourised, well groomed                     Cardiovascular: Regular rate rhythm, no peripheral edema, warm, nontender. Eyes: Conjunctivae clear without exudates or hemorrhage Neck: Supple, no carotid bruits. Pulmonary: Clear to auscultation bilaterally   NEUROLOGICAL EXAM:  MENTAL STATUS: Speech/cognition: Awake, alert, to name, cooperative on examination, dysarthria CRANIAL NERVES: CN II: Left Hemi visual field deficit CN III, IV, VI: extraocular movement are normal. No ptosis. CN V: Facial sensation is intact to light touch CN VII: Left lower face weakness, CN VIII: Hearing is normal to causal conversation. CN IX, X: Phonation is normal. CN XI: Head turning and shoulder shrug are intact  MOTOR: Dense left hemiparesis, no significant movement of left upper or lower extremity, flaccid, complains of left neck pain radiating to left jaw or left occipital region  REFLEXES: Hypoactive on left  side SENSORY: Left hemineglect COORDINATION: There is no trunk or limb dysmetria noted.  GAIT/STANCE: Deferred  REVIEW OF SYSTEMS:  Full 14 system review of systems performed and notable only for as above All other review of systems  were negative.   ALLERGIES: Allergies  Allergen Reactions   Adhesive [Tape] Itching and Other (See Comments)    Little red itchy bumps develop   Ativan  [Lorazepam ] Anxiety and Other (See Comments)    Family states makes her go crazy   Latex Itching and Other (See Comments)    Little red itchy bumps develop    HOME MEDICATIONS: Current Outpatient Medications  Medication Sig Dispense Refill   acetaminophen  (TYLENOL ) 650 MG CR tablet Take 1 tablet (650 mg total) by mouth 3 (three) times daily.     albuterol  (PROVENTIL  HFA;VENTOLIN  HFA) 108 (90 Base) MCG/ACT inhaler Inhale 2 puffs into the lungs every 6 (six) hours as needed for wheezing or shortness of breath.     arformoterol  (BROVANA ) 15 MCG/2ML NEBU Take 2 mLs (15 mcg total) by nebulization 2 (two) times daily. 360 mL 3   artificial tears ophthalmic solution Place 1 drop into both eyes as needed for dry eyes. 15 mL 0   aspirin  EC 81 MG tablet Take 81 mg by mouth daily.     atorvastatin  (LIPITOR) 10 MG tablet Take 1 tablet (10 mg total) by mouth at bedtime. 30 tablet 0   Baclofen  5 MG TABS Take 1 tablet (5 mg total) by mouth 2 (two) times daily as needed. 60 tablet 0   benzocaine  (ORAJEL) 10 % mucosal gel Use as directed in the mouth or throat 2 (two) times daily as needed for mouth pain. 5.3 g 0   buPROPion  (WELLBUTRIN ) 75 MG tablet Take 1 tablet (75 mg total) by mouth 2 (two) times daily. 60 tablet 0   busPIRone  (BUSPAR ) 5 MG tablet Take 1 tablet (5 mg total) by mouth 3 (three) times daily as needed (Anxiety). 30 tablet 0   camphor-menthol  (SARNA) lotion Apply topically as needed for itching. 222 mL 0   Cholecalciferol  (VITAMIN D ) 50 MCG (2000 UT) tablet Take 1 tablet (2,000 Units total) by mouth daily.     diclofenac  Sodium (VOLTAREN ) 1 % GEL Apply 2 g topically 4 (four) times daily. 350 g 0   escitalopram  (LEXAPRO ) 20 MG tablet Take 2 tablets (40 mg total) by mouth daily. 30 tablet 0   l-methylfolate-B6-B12 (METANX) 3-35-2 MG TABS  tablet Take 1 tablet by mouth 2 (two) times daily. 60 tablet 0   lidocaine  (LIDODERM ) 5 % Place 2 patches onto the skin daily. Remove & Discard patch within 12 hours or as directed by MD 60 patch 0   melatonin 5 MG TABS Take 1 tablet (5 mg total) by mouth at bedtime. 30 tablet 0   Menthol , Topical Analgesic, 4 % GEL Apply 1 application  topically 4 (four) times daily.     modafinil  (PROVIGIL ) 100 MG tablet Take 1 tablet (100 mg total) by mouth daily. 30 tablet 0   Mouthwashes (MOUTH RINSE) LIQD solution 15 mLs by Mouth Rinse route as needed (oral care).     Multiple Vitamin (MULTIVITAMIN WITH MINERALS) TABS tablet Take 1 tablet by mouth daily.     nitroGLYCERIN  (NITROSTAT ) 0.4 MG SL tablet Place 1 tablet (0.4 mg total) under the tongue every 5 (five) minutes as needed for chest pain. 30 tablet 12   Nutritional Supplements (FEEDING SUPPLEMENT, OSMOLITE 1.5 CAL,) LIQD Place 237 mLs into feeding  tube 2 (two) times daily.     Nystatin (GERHARDT'S BUTT CREAM) CREA Apply 1 Application topically 3 (three) times daily. 1 each 0   polyethylene glycol (MIRALAX  / GLYCOLAX ) 17 g packet Place 17 g into feeding tube daily as needed for mild constipation. 14 each 0   revefenacin  (YUPELRI ) 175 MCG/3ML nebulizer solution Take 3 mLs (175 mcg total) by nebulization daily. 90 mL 1   senna (SENOKOT) 8.6 MG TABS tablet Take 1 tablet (8.6 mg total) by mouth daily. 120 tablet 0   traMADol  (ULTRAM ) 50 MG tablet Take 0.5-1 tablets (25-50 mg total) by mouth every 4 (four) hours as needed for severe pain (pain score 7-10). 35 tablet 0   Water  For Irrigation, Sterile (FREE WATER ) SOLN Place 120 mLs into feeding tube 3 (three) times daily.     No current facility-administered medications for this visit.    PAST MEDICAL HISTORY: Past Medical History:  Diagnosis Date   Anxiety    Back pain    Cancer (HCC)    Depression    Emphysema    Esophageal spasm    Lung nodule    OSA (obstructive sleep apnea)    Osteomalacia     Personal history of radiation therapy    Vertigo    Vitamin D  deficiency     PAST SURGICAL HISTORY: Past Surgical History:  Procedure Laterality Date   ABDOMINAL HYSTERECTOMY     APPENDECTOMY     BREAST BIOPSY Right 08/31/2020   BREAST EXCISIONAL BIOPSY Left 1978   BREAST LUMPECTOMY Right 09/30/2020   BREAST LUMPECTOMY WITH RADIOACTIVE SEED LOCALIZATION Right 09/30/2020   Procedure: RIGHT BREAST LUMPECTOMY WITH RADIOACTIVE SEED LOCALIZATION;  Surgeon: Curvin Deward MOULD, MD;  Location: [facility];  Service: General;  Laterality: Right;   CARPAL TUNNEL RELEASE     IR CT HEAD LTD  06/25/2024   IR GASTROSTOMY TUBE MOD SED  07/14/2024   IR PERCUTANEOUS ART THROMBECTOMY/INFUSION INTRACRANIAL INC DIAG ANGIO  06/25/2024   RADIOLOGY WITH ANESTHESIA N/A 06/25/2024   Procedure: RADIOLOGY WITH ANESTHESIA;  Surgeon: Dolphus Carrion, MD;  Location: [facility];  Service: Radiology;  Laterality: N/A;   ROTATOR CUFF REPAIR      FAMILY HISTORY: Family History  Problem Relation Age of Onset   Diabetes type II Other    Hypertension Other    Breast cancer Maternal Aunt        dx early 34s   Other Mother        died of natural causes at age 55   Other Father        stroke or heart attack while in shower   Diabetes type II Sister    Cancer Paternal Uncle        unknown type; dx early 46s    SOCIAL HISTORY: Social History   Socioeconomic History   Marital status: Widowed    Spouse name: Not on file   Number of children: 4   Years of education: Bachelors   Highest education level: Not on file  Occupational History   Occupation: Retired  Tobacco Use   Smoking status: Former    Current packs/day: 0.00    Average packs/day: 1 pack/day for 56.0 years (56.0 ttl pk-yrs)    Types: Cigarettes    Start date: 03/1960    Quit date: 03/2016    Years since quitting: 8.4   Smokeless tobacco: Never  Vaping Use   Vaping status: Never Used  Substance and Sexual Activity  Alcohol  use: No     Alcohol /week: 0.0 standard drinks of alcohol    Drug use: No   Sexual activity: Not Currently  Other Topics Concern   Not on file  Social History Narrative   Lives with daughter and son in law    Right-handed.   1.5 cups caffeine per day, occasional Diet Coke.   Social Drivers of Corporate investment banker Strain: Not on file  Food Insecurity: No Food Insecurity (06/22/2024)   Hunger Vital Sign    Worried About Running Out of Food in the Last Year: Never true    Ran Out of Food in the Last Year: Never true  Transportation Needs: No Transportation Needs (06/22/2024)   PRAPARE - Administrator, Civil Service (Medical): No    Lack of Transportation (Non-Medical): No  Physical Activity: Not on file  Stress: Not on file  Social Connections: Socially Isolated (06/22/2024)   Social Connection and Isolation Panel    Frequency of Communication with Friends and Family: Never    Frequency of Social Gatherings with Friends and Family: Never    Attends Religious Services: Never    Database administrator or Organizations: Yes    Attends Banker Meetings: 1 to 4 times per year    Marital Status: Widowed  Intimate Partner Violence: Not At Risk (06/22/2024)   Humiliation, Afraid, Rape, and Kick questionnaire    Fear of Current or Ex-Partner: No    Emotionally Abused: No    Physically Abused: No    Sexually Abused: No      Modena Callander, M.D. Ph.D.  [facility] [address], [address] [place], [place] [address] Ph: [phone] Fax: [phone]  CC:  Jerri Pfeiffer, MD [address] [place],  [place] [address]  [name], MD

## 2024-08-19 DIAGNOSIS — F329 Major depressive disorder, single episode, unspecified: Secondary | ICD-10-CM | POA: Diagnosis not present

## 2024-08-19 DIAGNOSIS — N390 Urinary tract infection, site not specified: Secondary | ICD-10-CM | POA: Diagnosis not present

## 2024-08-19 DIAGNOSIS — J439 Emphysema, unspecified: Secondary | ICD-10-CM | POA: Diagnosis not present

## 2024-08-19 DIAGNOSIS — I69354 Hemiplegia and hemiparesis following cerebral infarction affecting left non-dominant side: Secondary | ICD-10-CM | POA: Diagnosis not present

## 2024-08-19 DIAGNOSIS — E440 Moderate protein-calorie malnutrition: Secondary | ICD-10-CM | POA: Diagnosis not present

## 2024-08-19 DIAGNOSIS — E1151 Type 2 diabetes mellitus with diabetic peripheral angiopathy without gangrene: Secondary | ICD-10-CM | POA: Diagnosis not present

## 2024-08-19 DIAGNOSIS — B9620 Unspecified Escherichia coli [E. coli] as the cause of diseases classified elsewhere: Secondary | ICD-10-CM | POA: Diagnosis not present

## 2024-08-19 DIAGNOSIS — F419 Anxiety disorder, unspecified: Secondary | ICD-10-CM | POA: Diagnosis not present

## 2024-08-19 DIAGNOSIS — Z431 Encounter for attention to gastrostomy: Secondary | ICD-10-CM | POA: Diagnosis not present

## 2024-08-21 DIAGNOSIS — F329 Major depressive disorder, single episode, unspecified: Secondary | ICD-10-CM | POA: Diagnosis not present

## 2024-08-21 DIAGNOSIS — F419 Anxiety disorder, unspecified: Secondary | ICD-10-CM | POA: Diagnosis not present

## 2024-08-21 DIAGNOSIS — I69354 Hemiplegia and hemiparesis following cerebral infarction affecting left non-dominant side: Secondary | ICD-10-CM | POA: Diagnosis not present

## 2024-08-21 DIAGNOSIS — E1151 Type 2 diabetes mellitus with diabetic peripheral angiopathy without gangrene: Secondary | ICD-10-CM | POA: Diagnosis not present

## 2024-08-21 DIAGNOSIS — J439 Emphysema, unspecified: Secondary | ICD-10-CM | POA: Diagnosis not present

## 2024-08-21 DIAGNOSIS — N390 Urinary tract infection, site not specified: Secondary | ICD-10-CM | POA: Diagnosis not present

## 2024-08-21 DIAGNOSIS — E440 Moderate protein-calorie malnutrition: Secondary | ICD-10-CM | POA: Diagnosis not present

## 2024-08-21 DIAGNOSIS — Z431 Encounter for attention to gastrostomy: Secondary | ICD-10-CM | POA: Diagnosis not present

## 2024-08-21 DIAGNOSIS — B9620 Unspecified Escherichia coli [E. coli] as the cause of diseases classified elsewhere: Secondary | ICD-10-CM | POA: Diagnosis not present

## 2024-08-22 DIAGNOSIS — N390 Urinary tract infection, site not specified: Secondary | ICD-10-CM | POA: Diagnosis not present

## 2024-08-22 DIAGNOSIS — Z431 Encounter for attention to gastrostomy: Secondary | ICD-10-CM | POA: Diagnosis not present

## 2024-08-22 DIAGNOSIS — I69354 Hemiplegia and hemiparesis following cerebral infarction affecting left non-dominant side: Secondary | ICD-10-CM | POA: Diagnosis not present

## 2024-08-22 DIAGNOSIS — F329 Major depressive disorder, single episode, unspecified: Secondary | ICD-10-CM | POA: Diagnosis not present

## 2024-08-22 DIAGNOSIS — B9620 Unspecified Escherichia coli [E. coli] as the cause of diseases classified elsewhere: Secondary | ICD-10-CM | POA: Diagnosis not present

## 2024-08-22 DIAGNOSIS — J439 Emphysema, unspecified: Secondary | ICD-10-CM | POA: Diagnosis not present

## 2024-08-22 DIAGNOSIS — E1151 Type 2 diabetes mellitus with diabetic peripheral angiopathy without gangrene: Secondary | ICD-10-CM | POA: Diagnosis not present

## 2024-08-22 DIAGNOSIS — E440 Moderate protein-calorie malnutrition: Secondary | ICD-10-CM | POA: Diagnosis not present

## 2024-08-22 DIAGNOSIS — F419 Anxiety disorder, unspecified: Secondary | ICD-10-CM | POA: Diagnosis not present

## 2024-08-25 DIAGNOSIS — B9620 Unspecified Escherichia coli [E. coli] as the cause of diseases classified elsewhere: Secondary | ICD-10-CM | POA: Diagnosis not present

## 2024-08-25 DIAGNOSIS — R1312 Dysphagia, oropharyngeal phase: Secondary | ICD-10-CM | POA: Diagnosis not present

## 2024-08-25 DIAGNOSIS — Z431 Encounter for attention to gastrostomy: Secondary | ICD-10-CM | POA: Diagnosis not present

## 2024-08-25 DIAGNOSIS — N390 Urinary tract infection, site not specified: Secondary | ICD-10-CM | POA: Diagnosis not present

## 2024-08-25 DIAGNOSIS — J439 Emphysema, unspecified: Secondary | ICD-10-CM | POA: Diagnosis not present

## 2024-08-25 DIAGNOSIS — F419 Anxiety disorder, unspecified: Secondary | ICD-10-CM | POA: Diagnosis not present

## 2024-08-25 DIAGNOSIS — E440 Moderate protein-calorie malnutrition: Secondary | ICD-10-CM | POA: Diagnosis not present

## 2024-08-25 DIAGNOSIS — F329 Major depressive disorder, single episode, unspecified: Secondary | ICD-10-CM | POA: Diagnosis not present

## 2024-08-25 DIAGNOSIS — Z6826 Body mass index (BMI) 26.0-26.9, adult: Secondary | ICD-10-CM | POA: Diagnosis not present

## 2024-08-25 DIAGNOSIS — E1151 Type 2 diabetes mellitus with diabetic peripheral angiopathy without gangrene: Secondary | ICD-10-CM | POA: Diagnosis not present

## 2024-08-25 DIAGNOSIS — I69354 Hemiplegia and hemiparesis following cerebral infarction affecting left non-dominant side: Secondary | ICD-10-CM | POA: Diagnosis not present

## 2024-08-25 DIAGNOSIS — R32 Unspecified urinary incontinence: Secondary | ICD-10-CM | POA: Diagnosis not present

## 2024-08-27 ENCOUNTER — Encounter (INDEPENDENT_AMBULATORY_CARE_PROVIDER_SITE_OTHER): Payer: Self-pay | Admitting: Neurology

## 2024-08-27 DIAGNOSIS — I63511 Cerebral infarction due to unspecified occlusion or stenosis of right middle cerebral artery: Secondary | ICD-10-CM | POA: Diagnosis not present

## 2024-08-27 DIAGNOSIS — R519 Headache, unspecified: Secondary | ICD-10-CM

## 2024-08-27 DIAGNOSIS — I69354 Hemiplegia and hemiparesis following cerebral infarction affecting left non-dominant side: Secondary | ICD-10-CM

## 2024-08-27 MED ORDER — LAMOTRIGINE 25 MG PO TABS: ORAL_TABLET | ORAL | 11 refills | Status: DC

## 2024-08-27 NOTE — Telephone Encounter (Signed)
 I talked with her daughter, she has intermittent left side pain, involving left neck, left jaw, left side of her body  She suffered large right MCA stroke in July 2025 with residual left hemiparesis, left hemineglect  Gabapentin  300 mg twice daily up to 4 times a day was helpful  I will add on lamotrigine  25 mg twice a day, titrating to 2 tablets twice a day,  Meds ordered this encounter  Medications   lamoTRIgine  (LAMICTAL ) 25 MG tablet    Sig: One tab bid xone week Then 2 tabs bid    Dispense:  120 tablet    Refill:  11     Please see the MyChart message reply(ies) for my assessment and plan.    This patient gave consent for this Medical Advice Message and is aware that it may result in a bill to [employer], as well as the possibility of receiving a bill for a co-payment or deductible. They are an established patient, but are not seeking medical advice exclusively about a problem treated during an in person or video visit in the last seven days. I did not recommend an in person or video visit within seven days of my reply.    I spent a total of 10 minutes cumulative time within 7 days through [employer].  Modena Callander, MD

## 2024-08-27 NOTE — Addendum Note (Signed)
 Addended by: Garnell Begeman on: 08/27/2024 03:32 PM   Modules accepted: Orders

## 2024-08-28 ENCOUNTER — Telehealth: Payer: Self-pay

## 2024-08-28 MED ORDER — LAMOTRIGINE 25 MG PO TABS: ORAL_TABLET | ORAL | 11 refills | Status: DC

## 2024-08-28 NOTE — Telephone Encounter (Signed)
 Faxed lamictal  script to [facility] [address], confirmation received

## 2024-08-28 NOTE — Addendum Note (Signed)
 Addended by: JOSHUA IZETTA CROME on: 08/28/2024 12:58 PM   Modules accepted: Orders

## 2024-08-28 NOTE — Telephone Encounter (Signed)
 Pt's daughter called wanting to know when the medication will be called in to the Pharmacy. Please advise.

## 2024-08-29 DIAGNOSIS — I69354 Hemiplegia and hemiparesis following cerebral infarction affecting left non-dominant side: Secondary | ICD-10-CM | POA: Diagnosis not present

## 2024-08-29 DIAGNOSIS — B9620 Unspecified Escherichia coli [E. coli] as the cause of diseases classified elsewhere: Secondary | ICD-10-CM | POA: Diagnosis not present

## 2024-08-29 DIAGNOSIS — E1151 Type 2 diabetes mellitus with diabetic peripheral angiopathy without gangrene: Secondary | ICD-10-CM | POA: Diagnosis not present

## 2024-08-29 DIAGNOSIS — N390 Urinary tract infection, site not specified: Secondary | ICD-10-CM | POA: Diagnosis not present

## 2024-08-29 DIAGNOSIS — F329 Major depressive disorder, single episode, unspecified: Secondary | ICD-10-CM | POA: Diagnosis not present

## 2024-08-29 DIAGNOSIS — Z431 Encounter for attention to gastrostomy: Secondary | ICD-10-CM | POA: Diagnosis not present

## 2024-08-29 DIAGNOSIS — E440 Moderate protein-calorie malnutrition: Secondary | ICD-10-CM | POA: Diagnosis not present

## 2024-08-29 DIAGNOSIS — J439 Emphysema, unspecified: Secondary | ICD-10-CM | POA: Diagnosis not present

## 2024-08-29 DIAGNOSIS — F419 Anxiety disorder, unspecified: Secondary | ICD-10-CM | POA: Diagnosis not present

## 2024-08-31 DIAGNOSIS — R1310 Dysphagia, unspecified: Secondary | ICD-10-CM | POA: Diagnosis not present

## 2024-08-31 DIAGNOSIS — J449 Chronic obstructive pulmonary disease, unspecified: Secondary | ICD-10-CM | POA: Diagnosis not present

## 2024-08-31 DIAGNOSIS — I63511 Cerebral infarction due to unspecified occlusion or stenosis of right middle cerebral artery: Secondary | ICD-10-CM | POA: Diagnosis not present

## 2024-09-01 DIAGNOSIS — N390 Urinary tract infection, site not specified: Secondary | ICD-10-CM | POA: Diagnosis not present

## 2024-09-01 DIAGNOSIS — I69354 Hemiplegia and hemiparesis following cerebral infarction affecting left non-dominant side: Secondary | ICD-10-CM | POA: Diagnosis not present

## 2024-09-01 DIAGNOSIS — E440 Moderate protein-calorie malnutrition: Secondary | ICD-10-CM | POA: Diagnosis not present

## 2024-09-01 DIAGNOSIS — B9620 Unspecified Escherichia coli [E. coli] as the cause of diseases classified elsewhere: Secondary | ICD-10-CM | POA: Diagnosis not present

## 2024-09-01 DIAGNOSIS — F329 Major depressive disorder, single episode, unspecified: Secondary | ICD-10-CM | POA: Diagnosis not present

## 2024-09-01 DIAGNOSIS — E1151 Type 2 diabetes mellitus with diabetic peripheral angiopathy without gangrene: Secondary | ICD-10-CM | POA: Diagnosis not present

## 2024-09-01 DIAGNOSIS — F419 Anxiety disorder, unspecified: Secondary | ICD-10-CM | POA: Diagnosis not present

## 2024-09-01 DIAGNOSIS — J439 Emphysema, unspecified: Secondary | ICD-10-CM | POA: Diagnosis not present

## 2024-09-04 DIAGNOSIS — B9620 Unspecified Escherichia coli [E. coli] as the cause of diseases classified elsewhere: Secondary | ICD-10-CM | POA: Diagnosis not present

## 2024-09-04 DIAGNOSIS — J439 Emphysema, unspecified: Secondary | ICD-10-CM | POA: Diagnosis not present

## 2024-09-04 DIAGNOSIS — I69354 Hemiplegia and hemiparesis following cerebral infarction affecting left non-dominant side: Secondary | ICD-10-CM | POA: Diagnosis not present

## 2024-09-04 DIAGNOSIS — F419 Anxiety disorder, unspecified: Secondary | ICD-10-CM | POA: Diagnosis not present

## 2024-09-04 DIAGNOSIS — F332 Major depressive disorder, recurrent severe without psychotic features: Secondary | ICD-10-CM | POA: Diagnosis not present

## 2024-09-04 DIAGNOSIS — N390 Urinary tract infection, site not specified: Secondary | ICD-10-CM | POA: Diagnosis not present

## 2024-09-04 DIAGNOSIS — E1151 Type 2 diabetes mellitus with diabetic peripheral angiopathy without gangrene: Secondary | ICD-10-CM | POA: Diagnosis not present

## 2024-09-04 DIAGNOSIS — F411 Generalized anxiety disorder: Secondary | ICD-10-CM | POA: Diagnosis not present

## 2024-09-04 DIAGNOSIS — Z431 Encounter for attention to gastrostomy: Secondary | ICD-10-CM | POA: Diagnosis not present

## 2024-09-04 DIAGNOSIS — F329 Major depressive disorder, single episode, unspecified: Secondary | ICD-10-CM | POA: Diagnosis not present

## 2024-09-04 DIAGNOSIS — E440 Moderate protein-calorie malnutrition: Secondary | ICD-10-CM | POA: Diagnosis not present

## 2024-09-04 DIAGNOSIS — F4381 Prolonged grief disorder: Secondary | ICD-10-CM | POA: Diagnosis not present

## 2024-09-05 DIAGNOSIS — F329 Major depressive disorder, single episode, unspecified: Secondary | ICD-10-CM | POA: Diagnosis not present

## 2024-09-05 DIAGNOSIS — J439 Emphysema, unspecified: Secondary | ICD-10-CM | POA: Diagnosis not present

## 2024-09-05 DIAGNOSIS — N390 Urinary tract infection, site not specified: Secondary | ICD-10-CM | POA: Diagnosis not present

## 2024-09-05 DIAGNOSIS — B9620 Unspecified Escherichia coli [E. coli] as the cause of diseases classified elsewhere: Secondary | ICD-10-CM | POA: Diagnosis not present

## 2024-09-05 DIAGNOSIS — E440 Moderate protein-calorie malnutrition: Secondary | ICD-10-CM | POA: Diagnosis not present

## 2024-09-05 DIAGNOSIS — Z431 Encounter for attention to gastrostomy: Secondary | ICD-10-CM | POA: Diagnosis not present

## 2024-09-05 DIAGNOSIS — I69354 Hemiplegia and hemiparesis following cerebral infarction affecting left non-dominant side: Secondary | ICD-10-CM | POA: Diagnosis not present

## 2024-09-05 DIAGNOSIS — E1151 Type 2 diabetes mellitus with diabetic peripheral angiopathy without gangrene: Secondary | ICD-10-CM | POA: Diagnosis not present

## 2024-09-08 ENCOUNTER — Encounter: Payer: Self-pay | Admitting: Physical Medicine and Rehabilitation

## 2024-09-08 ENCOUNTER — Encounter: Attending: Physical Medicine and Rehabilitation | Admitting: Physical Medicine and Rehabilitation

## 2024-09-08 VITALS — BP 127/77 | HR 62

## 2024-09-08 DIAGNOSIS — E1151 Type 2 diabetes mellitus with diabetic peripheral angiopathy without gangrene: Secondary | ICD-10-CM | POA: Diagnosis not present

## 2024-09-08 DIAGNOSIS — K9422 Gastrostomy infection: Secondary | ICD-10-CM | POA: Insufficient documentation

## 2024-09-08 DIAGNOSIS — N390 Urinary tract infection, site not specified: Secondary | ICD-10-CM | POA: Diagnosis not present

## 2024-09-08 DIAGNOSIS — R630 Anorexia: Secondary | ICD-10-CM | POA: Diagnosis not present

## 2024-09-08 DIAGNOSIS — R21 Rash and other nonspecific skin eruption: Secondary | ICD-10-CM | POA: Diagnosis not present

## 2024-09-08 DIAGNOSIS — Z431 Encounter for attention to gastrostomy: Secondary | ICD-10-CM | POA: Diagnosis not present

## 2024-09-08 DIAGNOSIS — I69354 Hemiplegia and hemiparesis following cerebral infarction affecting left non-dominant side: Secondary | ICD-10-CM | POA: Diagnosis not present

## 2024-09-08 DIAGNOSIS — E440 Moderate protein-calorie malnutrition: Secondary | ICD-10-CM | POA: Diagnosis not present

## 2024-09-08 DIAGNOSIS — M79609 Pain in unspecified limb: Secondary | ICD-10-CM | POA: Insufficient documentation

## 2024-09-08 DIAGNOSIS — B9620 Unspecified Escherichia coli [E. coli] as the cause of diseases classified elsewhere: Secondary | ICD-10-CM | POA: Diagnosis not present

## 2024-09-08 DIAGNOSIS — J439 Emphysema, unspecified: Secondary | ICD-10-CM | POA: Diagnosis not present

## 2024-09-08 DIAGNOSIS — F329 Major depressive disorder, single episode, unspecified: Secondary | ICD-10-CM | POA: Diagnosis not present

## 2024-09-08 DIAGNOSIS — I6601 Occlusion and stenosis of right middle cerebral artery: Secondary | ICD-10-CM | POA: Insufficient documentation

## 2024-09-08 DIAGNOSIS — F419 Anxiety disorder, unspecified: Secondary | ICD-10-CM | POA: Diagnosis not present

## 2024-09-08 MED ORDER — CEPHALEXIN 500 MG PO CAPS: 500.0000 mg | ORAL_CAPSULE | Freq: Two times a day (BID) | ORAL | 0 refills | Status: DC

## 2024-09-08 NOTE — Progress Notes (Signed)
 Subjective:    Patient ID: [name], female    DOB: Apr 07, 1932, 89 y.o.   MRN: [id number]  HPI [name] is a 89 year old woman who presents for follow-up of CVA  1) CVA: -has post stroke pain in her left arm and leg -has had one fall -has been using [employer] lift at home  2) Decreased appetite: -her daughter has already cut down on tramadol   Pain Inventory Average Pain 10 Pain Right Now 0 My pain is intermittent and sharp  In the last 24 hours, has pain interfered with the following? General activity 6 Relation with others 6 Enjoyment of life 6 What TIME of day is your pain at its worst? varies Sleep (in general) Good  Pain is worse with: some activites Pain improves with: rest, heat/ice, and medication Relief from Meds: 7  how many minutes can you walk? 0 ability to climb steps?  no do you drive?  no use a wheelchair needs help with transfers transfers alone Do you have any goals in this area?  yes  retired I need assistance with the following:  feeding, dressing, bathing, toileting, meal prep, household duties, and shopping Do you have any goals in this area?  yes  bladder control problems weakness numbness trouble walking spasms dizziness confusion depression anxiety  Any changes since last visit?  yes  Primary care Sari Cable, MD Neurologist Dr. Madison    Family History  Problem Relation Age of Onset   Diabetes type II Other    Hypertension Other    Breast cancer Maternal Aunt        dx early 76s   Other Mother        died of natural causes at age 12   Other Father        stroke or heart attack while in shower   Diabetes type II Sister    Cancer Paternal Uncle        unknown type; dx early 58s   Social History   Socioeconomic History   Marital status: Widowed    Spouse name: Not on file   Number of children: 4   Years of education: Bachelors   Highest education level: Not on file  Occupational History   Occupation:  Retired  Tobacco Use   Smoking status: Former    Current packs/day: 0.00    Average packs/day: 1 pack/day for 56.0 years (56.0 ttl pk-yrs)    Types: Cigarettes    Start date: 03/1960    Quit date: 03/2016    Years since quitting: 8.4   Smokeless tobacco: Never  Vaping Use   Vaping status: Never Used  Substance and Sexual Activity   Alcohol  use: No    Alcohol /week: 0.0 standard drinks of alcohol    Drug use: No   Sexual activity: Not Currently  Other Topics Concern   Not on file  Social History Narrative   Lives with daughter and son in law    Right-handed.   1.5 cups caffeine per day, occasional Diet Coke.   Social Drivers of Corporate investment banker Strain: Not on file  Food Insecurity: No Food Insecurity (06/22/2024)   Hunger Vital Sign    Worried About Running Out of Food in the Last Year: Never true    Ran Out of Food in the Last Year: Never true  Transportation Needs: No Transportation Needs (06/22/2024)   PRAPARE - Transportation    Lack of Transportation (Medical): No    Lack  of Transportation (Non-Medical): No  Physical Activity: Not on file  Stress: Not on file  Social Connections: Socially Isolated (06/22/2024)   Social Connection and Isolation Panel    Frequency of Communication with Friends and Family: Never    Frequency of Social Gatherings with Friends and Family: Never    Attends Religious Services: Never    Database administrator or Organizations: Yes    Attends Banker Meetings: 1 to 4 times per year    Marital Status: Widowed   Past Surgical History:  Procedure Laterality Date   ABDOMINAL HYSTERECTOMY     APPENDECTOMY     BREAST BIOPSY Right 08/31/2020   BREAST EXCISIONAL BIOPSY Left 1978   BREAST LUMPECTOMY Right 09/30/2020   BREAST LUMPECTOMY WITH RADIOACTIVE SEED LOCALIZATION Right 09/30/2020   Procedure: RIGHT BREAST LUMPECTOMY WITH RADIOACTIVE SEED LOCALIZATION;  Surgeon: Curvin Deward MOULD, MD;  Location: [facility];   Service: General;  Laterality: Right;   CARPAL TUNNEL RELEASE     IR CT HEAD LTD  06/25/2024   IR GASTROSTOMY TUBE MOD SED  07/14/2024   IR PERCUTANEOUS ART THROMBECTOMY/INFUSION INTRACRANIAL INC DIAG ANGIO  06/25/2024   RADIOLOGY WITH ANESTHESIA N/A 06/25/2024   Procedure: RADIOLOGY WITH ANESTHESIA;  Surgeon: Dolphus Carrion, MD;  Location: [facility];  Service: Radiology;  Laterality: N/A;   ROTATOR CUFF REPAIR     Past Medical History:  Diagnosis Date   Anxiety    Back pain    Cancer (HCC)    Depression    Emphysema    Esophageal spasm    Lung nodule    OSA (obstructive sleep apnea)    Osteomalacia    Personal history of radiation therapy    Vertigo    Vitamin D  deficiency    BP 127/77 (BP Location: Right Arm, Patient Position: Sitting, Cuff Size: Normal)   Pulse 62   SpO2 90%   Opioid Risk Score:   Fall Risk Score:  `1  Depression screen [facility] 2/9     09/08/2024    1:30 PM  Depression screen [facility] 2/9  Decreased Interest 1  Down, Depressed, Hopeless 1  PHQ - 2 Score 2  Altered sleeping 0  Tired, decreased energy 1  Change in appetite 3  Feeling bad or failure about yourself  1  Trouble concentrating 0  Moving slowly or fidgety/restless 0  Suicidal thoughts 0  PHQ-9 Score 7  Difficult doing work/chores Very difficult     Review of Systems  Musculoskeletal:        Left side pain  Neurological:  Positive for weakness and numbness.       Left side sharp nerve pain, weakness       Objective:   Physical Exam Gen: no distress, normal appearing HEENT: oral mucosa pink and moist, NCAT Cardio: Reg rate Chest: normal effort, normal rate of breathing Abd: soft, non-distended Ext: no edema Psych: pleasant, normal affect Skin: intact Neuro: Alert and oriented x3, 1/5 strength in left shoulder and biceps and triceps, 1/5 strength in adduction of her left lower extremity, movement of her toes      Assessment & Plan:  1) Rash: -discussed follow-up with  dermatology  2) MCA CVA: -continue PT, OT, SLP at home -continue handicap placard WC prescribed  3) Dysphagia: -discussed that SLP is upgrading her from dysphagia foods  4) Pain: -discussed that lamictal  and gabapentin  have been helpful  5) PEG site infection: Keflex  prescribed  6) Decreased appetite: -discussed megace,  marinol, mirtazepine -encouraged TF only after eating

## 2024-09-10 DIAGNOSIS — Z431 Encounter for attention to gastrostomy: Secondary | ICD-10-CM | POA: Diagnosis not present

## 2024-09-10 DIAGNOSIS — B9620 Unspecified Escherichia coli [E. coli] as the cause of diseases classified elsewhere: Secondary | ICD-10-CM | POA: Diagnosis not present

## 2024-09-10 DIAGNOSIS — I69354 Hemiplegia and hemiparesis following cerebral infarction affecting left non-dominant side: Secondary | ICD-10-CM | POA: Diagnosis not present

## 2024-09-10 DIAGNOSIS — E1151 Type 2 diabetes mellitus with diabetic peripheral angiopathy without gangrene: Secondary | ICD-10-CM | POA: Diagnosis not present

## 2024-09-10 DIAGNOSIS — F329 Major depressive disorder, single episode, unspecified: Secondary | ICD-10-CM | POA: Diagnosis not present

## 2024-09-10 DIAGNOSIS — J439 Emphysema, unspecified: Secondary | ICD-10-CM | POA: Diagnosis not present

## 2024-09-10 DIAGNOSIS — N390 Urinary tract infection, site not specified: Secondary | ICD-10-CM | POA: Diagnosis not present

## 2024-09-10 DIAGNOSIS — E440 Moderate protein-calorie malnutrition: Secondary | ICD-10-CM | POA: Diagnosis not present

## 2024-09-10 DIAGNOSIS — F419 Anxiety disorder, unspecified: Secondary | ICD-10-CM | POA: Diagnosis not present

## 2024-09-11 ENCOUNTER — Other Ambulatory Visit: Payer: Self-pay

## 2024-09-11 ENCOUNTER — Emergency Department (HOSPITAL_COMMUNITY)
Admission: EM | Admit: 2024-09-11 | Discharge: 2024-09-11 | Disposition: A | Discharge status: Home / Self Care | Attending: Emergency Medicine | Admitting: Emergency Medicine

## 2024-09-11 ENCOUNTER — Emergency Department (EMERGENCY_DEPARTMENT_HOSPITAL)

## 2024-09-11 ENCOUNTER — Encounter (HOSPITAL_COMMUNITY): Payer: Self-pay

## 2024-09-11 DIAGNOSIS — R600 Localized edema: Secondary | ICD-10-CM

## 2024-09-11 DIAGNOSIS — R609 Edema, unspecified: Secondary | ICD-10-CM | POA: Diagnosis not present

## 2024-09-11 DIAGNOSIS — M7989 Other specified soft tissue disorders: Secondary | ICD-10-CM | POA: Insufficient documentation

## 2024-09-11 DIAGNOSIS — M79605 Pain in left leg: Secondary | ICD-10-CM | POA: Diagnosis not present

## 2024-09-11 DIAGNOSIS — Z7982 Long term (current) use of aspirin: Secondary | ICD-10-CM | POA: Insufficient documentation

## 2024-09-11 DIAGNOSIS — F419 Anxiety disorder, unspecified: Secondary | ICD-10-CM | POA: Diagnosis not present

## 2024-09-11 DIAGNOSIS — N390 Urinary tract infection, site not specified: Secondary | ICD-10-CM | POA: Diagnosis not present

## 2024-09-11 DIAGNOSIS — Z91040 Latex allergy status: Secondary | ICD-10-CM | POA: Insufficient documentation

## 2024-09-11 DIAGNOSIS — F329 Major depressive disorder, single episode, unspecified: Secondary | ICD-10-CM | POA: Diagnosis not present

## 2024-09-11 DIAGNOSIS — Z7401 Bed confinement status: Secondary | ICD-10-CM | POA: Diagnosis not present

## 2024-09-11 DIAGNOSIS — R001 Bradycardia, unspecified: Secondary | ICD-10-CM | POA: Diagnosis not present

## 2024-09-11 DIAGNOSIS — E440 Moderate protein-calorie malnutrition: Secondary | ICD-10-CM | POA: Diagnosis not present

## 2024-09-11 DIAGNOSIS — E1151 Type 2 diabetes mellitus with diabetic peripheral angiopathy without gangrene: Secondary | ICD-10-CM | POA: Diagnosis not present

## 2024-09-11 DIAGNOSIS — Z431 Encounter for attention to gastrostomy: Secondary | ICD-10-CM | POA: Diagnosis not present

## 2024-09-11 DIAGNOSIS — Z8673 Personal history of transient ischemic attack (TIA), and cerebral infarction without residual deficits: Secondary | ICD-10-CM | POA: Diagnosis not present

## 2024-09-11 DIAGNOSIS — J439 Emphysema, unspecified: Secondary | ICD-10-CM | POA: Diagnosis not present

## 2024-09-11 DIAGNOSIS — I69354 Hemiplegia and hemiparesis following cerebral infarction affecting left non-dominant side: Secondary | ICD-10-CM | POA: Diagnosis not present

## 2024-09-11 DIAGNOSIS — B9620 Unspecified Escherichia coli [E. coli] as the cause of diseases classified elsewhere: Secondary | ICD-10-CM | POA: Diagnosis not present

## 2024-09-11 HISTORY — DX: Cerebral infarction, unspecified: I63.9

## 2024-09-11 LAB — CBC WITH DIFFERENTIAL/PLATELET
Abs Immature Granulocytes: 0.04 K/uL (ref 0.00–0.07)
Basophils Absolute: 0.1 K/uL (ref 0.0–0.1)
Basophils Relative: 1 %
Eosinophils Absolute: 0.3 K/uL (ref 0.0–0.5)
Eosinophils Relative: 6 %
HCT: 36.9 % (ref 36.0–46.0)
Hemoglobin: 12 g/dL (ref 12.0–15.0)
Immature Granulocytes: 1 %
Lymphocytes Relative: 17 %
Lymphs Abs: 1 K/uL (ref 0.7–4.0)
MCH: 31.4 pg (ref 26.0–34.0)
MCHC: 32.5 g/dL (ref 30.0–36.0)
MCV: 96.6 fL (ref 80.0–100.0)
Monocytes Absolute: 0.6 K/uL (ref 0.1–1.0)
Monocytes Relative: 10 %
Neutro Abs: 4.1 K/uL (ref 1.7–7.7)
Neutrophils Relative %: 65 %
Platelets: 179 K/uL (ref 150–400)
RBC: 3.82 MIL/uL — ABNORMAL LOW (ref 3.87–5.11)
RDW: 14.3 % (ref 11.5–15.5)
WBC: 6.2 K/uL (ref 4.0–10.5)
nRBC: 0 % (ref 0.0–0.2)

## 2024-09-11 LAB — COMPREHENSIVE METABOLIC PANEL WITH GFR
ALT: 13 U/L (ref 0–44)
AST: 16 U/L (ref 15–41)
Albumin: 3 g/dL — ABNORMAL LOW (ref 3.5–5.0)
Alkaline Phosphatase: 81 U/L (ref 38–126)
Anion gap: 11 (ref 5–15)
BUN: 19 mg/dL (ref 8–23)
CO2: 28 mmol/L (ref 22–32)
Calcium: 9 mg/dL (ref 8.9–10.3)
Chloride: 100 mmol/L (ref 98–111)
Creatinine, Ser: 0.83 mg/dL (ref 0.44–1.00)
GFR, Estimated: >60 mL/min (ref >60)
Glucose, Bld: 87 mg/dL (ref 70–99)
Potassium: 3.8 mmol/L (ref 3.5–5.1)
Sodium: 139 mmol/L (ref 135–145)
Total Bilirubin: 0.5 mg/dL (ref 0.0–1.2)
Total Protein: 5.8 g/dL — ABNORMAL LOW (ref 6.5–8.1)

## 2024-09-11 LAB — APTT: aPTT: 37 s — ABNORMAL HIGH (ref 24–36)

## 2024-09-11 LAB — PROTIME-INR
INR: 1 (ref 0.8–1.2)
Prothrombin Time: 14 s (ref 11.4–15.2)

## 2024-09-11 NOTE — ED Triage Notes (Signed)
 Patient presents to the ER via EMS from home with c/o left leg swelling. Patient had a stroke 2 months ago that left her left arm/leg paralyzed. Patient is bed ridden. Patient denies fall or injury. Staff noticed swelling to the left leg 24 hours pta. No heat or redness noted. Patient denies pain or SOB.

## 2024-09-11 NOTE — ED Provider Notes (Signed)
 [place] EMERGENCY DEPARTMENT AT [facility] Provider Note   CSN: [id number] Arrival date & time: 09/11/24  1514     Patient presents with: Leg Swelling   [name] is a 89 y.o. female.   Patient has a recent history of CVA with left-sided hemiparesis and came in with a chief complaint of lower extremity swelling.  Patient accompanied by daughter who also noticed that swelling started yesterday.  Daughter mentioned that pt's entire left leg appears to be swollen compared to the right and this was also observed by physical therapist who saw the patient yesterday.  Patient mentioned she had pain behind the knee yesterday however does not have any pain today.  Daughter mentioned there is some change in color in the left leg. Patient denied any fevers, chills, chest pain, shortness of breath.  Patient is on aspirin  and not on any blood thinners.  Daughter mentioned that patient was started on Keflex  twice daily on 9/22 for a 7-day course due to possible infection around the PEG tube area.  Daughter mentioned that the area around the PEG tube looks like it has imrpoved.        Prior to Admission medications   Medication Sig Start Date End Date Taking? Authorizing Provider  acetaminophen  (TYLENOL ) 650 MG CR tablet Take 1 tablet (650 mg total) by mouth 3 (three) times daily. 07/31/24   Love, Sharlet RAMAN, PA-C  albuterol  (PROVENTIL  HFA;VENTOLIN  HFA) 108 (90 Base) MCG/ACT inhaler Inhale 2 puffs into the lungs every 6 (six) hours as needed for wheezing or shortness of breath.    [provider]  arformoterol  (BROVANA ) 15 MCG/2ML NEBU Take 2 mLs (15 mcg total) by nebulization 2 (two) times daily. 08/07/24   Raulkar, Sven SQUIBB, MD  artificial tears ophthalmic solution Place 1 drop into both eyes as needed for dry eyes. 07/08/24   de Clint Kill, Cortney E, NP  aspirin  EC 81 MG tablet Take 81 mg by mouth daily.    [provider]  atorvastatin  (LIPITOR) 10 MG tablet Take 1  tablet (10 mg total) by mouth at bedtime. 07/31/24   Love, Sharlet RAMAN, PA-C  Baclofen  5 MG TABS Take 1 tablet (5 mg total) by mouth 2 (two) times daily as needed. 08/07/24   Raulkar, Sven SQUIBB, MD  benzocaine  (ORAJEL) 10 % mucosal gel Use as directed in the mouth or throat 2 (two) times daily as needed for mouth pain. 07/31/24   Love, Sharlet RAMAN, PA-C  buPROPion  (WELLBUTRIN ) 75 MG tablet Take 1 tablet (75 mg total) by mouth 2 (two) times daily. 07/31/24   Love, Sharlet RAMAN, PA-C  busPIRone  (BUSPAR ) 5 MG tablet Take 1 tablet (5 mg total) by mouth 3 (three) times daily as needed (Anxiety). 07/31/24   Love, Sharlet RAMAN, PA-C  camphor-menthol  [facility]) lotion Apply topically as needed for itching. 07/31/24   Love, Sharlet RAMAN, PA-C  cephALEXin  (KEFLEX ) 500 MG capsule Take 1 capsule (500 mg total) by mouth 2 (two) times daily. 09/08/24   Raulkar, Sven SQUIBB, MD  Cholecalciferol  (VITAMIN D ) 50 MCG (2000 UT) tablet Take 1 tablet (2,000 Units total) by mouth daily. 07/31/24   Love, Sharlet RAMAN, PA-C  diclofenac  Sodium (VOLTAREN ) 1 % GEL Apply 2 g topically 4 (four) times daily. 07/31/24   Love, Sharlet RAMAN, PA-C  escitalopram  (LEXAPRO ) 20 MG tablet Take 2 tablets (40 mg total) by mouth daily. 07/31/24   Love, Sharlet RAMAN, PA-C  gabapentin  (NEURONTIN ) 300 MG capsule Take 1 capsule (  300 mg total) by mouth 3 (three) times daily as needed. 08/14/24   Onita Duos, MD  l-methylfolate-B6-B12 (METANX) 3-35-2 MG TABS tablet Take 1 tablet by mouth 2 (two) times daily. 07/31/24   Love, Sharlet RAMAN, PA-C  lamoTRIgine  (LAMICTAL ) 25 MG tablet One tab bid xone week Then 2 tabs bid 08/28/24   Onita Duos, MD  lidocaine  (LIDODERM ) 5 % Place 2 patches onto the skin daily. Remove & Discard patch within 12 hours or as directed by MD 07/31/24   Love, Sharlet RAMAN, PA-C  melatonin 5 MG TABS Take 1 tablet (5 mg total) by mouth at bedtime. 07/31/24   Love, Sharlet RAMAN, PA-C  Menthol , Topical Analgesic, 4 % GEL Apply 1 application  topically 4 (four) times daily. 07/31/24   Love,  Sharlet RAMAN, PA-C  modafinil  (PROVIGIL ) 100 MG tablet Take 1 tablet (100 mg total) by mouth daily. 08/14/24   Onita Duos, MD  Mouthwashes (MOUTH RINSE) LIQD solution 15 mLs by Mouth Rinse route as needed (oral care). 07/31/24   Love, Sharlet RAMAN, PA-C  Multiple Vitamin (MULTIVITAMIN WITH MINERALS) TABS tablet Take 1 tablet by mouth daily.    [provider]  nitroGLYCERIN  (NITROSTAT ) 0.4 MG SL tablet Place 1 tablet (0.4 mg total) under the tongue every 5 (five) minutes as needed for chest pain. 07/08/24   de Clint Kill, Cortney E, NP  Nutritional Supplements (FEEDING SUPPLEMENT, OSMOLITE 1.5 CAL,) LIQD Place 237 mLs into feeding tube 2 (two) times daily. 07/31/24   Love, Sharlet RAMAN, PA-C  Nystatin (GERHARDT'S BUTT CREAM) CREA Apply 1 Application topically 3 (three) times daily. 07/08/24   de Clint Kill, Cortney E, NP  polyethylene glycol (MIRALAX  / GLYCOLAX ) 17 g packet Place 17 g into feeding tube daily as needed for mild constipation. 07/08/24   de Clint Kill, Cortney E, NP  revefenacin  (YUPELRI ) 175 MCG/3ML nebulizer solution Take 3 mLs (175 mcg total) by nebulization daily. 08/02/24   Love, Sharlet RAMAN, PA-C  senna (SENOKOT) 8.6 MG TABS tablet Take 1 tablet (8.6 mg total) by mouth daily. 08/01/24   Love, Sharlet RAMAN, PA-C  traMADol  (ULTRAM ) 50 MG tablet Take 0.5-1 tablets (25-50 mg total) by mouth every 4 (four) hours as needed for severe pain (pain score 7-10). 07/31/24   Love, Sharlet RAMAN, PA-C  Water  For Irrigation, Sterile (FREE WATER ) SOLN Place 120 mLs into feeding tube 3 (three) times daily. 07/31/24   Love, Sharlet RAMAN, PA-C    Allergies: Adhesive [tape], Ativan  [lorazepam ], and Latex    Review of Systems  Reason unable to perform ROS: All pertinent ROS in HPI, MDM.    Updated Vital Signs BP 134/73   Pulse (!) 59   Temp 98.7 F (37.1 C)   Resp 14   Ht 5' 1 (1.549 m)   Wt 68 kg   SpO2 97%   BMI 28.34 kg/m   Physical Exam Constitutional:      Comments: Frail woman resting comfortably in bed  HENT:      Head: Normocephalic.  Cardiovascular:     Rate and Rhythm: Normal rate and regular rhythm.     Comments: +2 DP pulses bilaterally Pulmonary:     Effort: Pulmonary effort is normal.     Breath sounds: Normal breath sounds.  Abdominal:     General: There is no distension.     Tenderness: There is no abdominal tenderness.     Comments: PEG tube noted in the left upper quadrant region.  Mild skin redness/irritation noted  but no signs of pus or bloody drainage in the PEG tube area.  Skin:    General: Skin is warm.  Neurological:     Mental Status: She is alert and oriented to person, place, and time.     Comments: Left lower extremity: diminished sensation with no active range of motion--unchanged from recent baseline, per daughter.  Knee and proximal calf moderately larger in size compared to the right leg.  No redness, tenderness, warmth noted around the knee or ankle.  Psychiatric:        Mood and Affect: Mood normal.     (all labs ordered are listed, but only abnormal results are displayed) Labs Reviewed - No data to display  EKG: None  Radiology: No results found.   Procedures  None Medications Ordered in the ED - No data to display  Clinical Course as of 09/11/24 1622  Thu Sep 11, 2024  1621 VAS US  LOWER EXTREMITY VENOUS (DVT) (ONLY [facility] & [facility]) [RS]    Clinical Course User Index [RS] Edgardo Pontiff, DO                                Medical Decision Making Patient came in with chief complaint of left lower extremity swelling.  Differential diagnosis includes DVT, ruptured Baker's cyst, septic arthritis, osteomyelitis.  Pt is afebrile with stable vital signs and does not look septic.  DVT is high on our differential due to her recent hospitalization and limited ambulation.   Patient's lab came back unremarkable for any infections.  Ultrasound of left lower extremity was negative for DVT or ruptured cysts.  Symptoms were likely due to chronic venous insufficiency or  poor lymphatic drainage.  Patient informed about treating leg swelling symptomatically with compression stockings & by keeping her legs elevated.  Patient informed to see her PCP soon for further management.  Patient was stable to be discharged.   Amount and/or Complexity of Data Reviewed External Data Reviewed: notes. Labs: ordered.    Details: CBC, CMP, PT, INR Radiology:  Decision-making details documented in ED Course.    Details: Left Lower extremity ultrasound     Final diagnoses:  None    ED Discharge Orders     None          Edgardo Pontiff, DO 09/11/24 2103    Pamella Ozell LABOR, DO 09/14/24 2002

## 2024-09-11 NOTE — Progress Notes (Signed)
 LLE venous duplex has been completed.  Preliminary results given to Dr. Pamella.   Results can be found under chart review under CV PROC. 09/11/2024 5:11 PM Maebell Lyvers RVT, RDMS

## 2024-09-11 NOTE — Discharge Instructions (Signed)
 You were seen for left leg swelling.  All lab results were normal. Left leg did not show any clots or ruptured cysts on the ultrasound.  You did not have any fever and with your lab and imaging findings we do not suspect an ongoing infection. Your swelling could be due to venous insufficiency.  This can be managed symptomatically with compression stockings and keeping the legs elevated.  Please follow-up with your PCP for further management.  Please go to the nearest ED if you develop chest pain or increased swelling in legs or are experiencing worsening of symptoms.

## 2024-09-12 DIAGNOSIS — F329 Major depressive disorder, single episode, unspecified: Secondary | ICD-10-CM | POA: Diagnosis not present

## 2024-09-12 DIAGNOSIS — J439 Emphysema, unspecified: Secondary | ICD-10-CM | POA: Diagnosis not present

## 2024-09-12 DIAGNOSIS — R600 Localized edema: Secondary | ICD-10-CM | POA: Diagnosis not present

## 2024-09-12 DIAGNOSIS — B9620 Unspecified Escherichia coli [E. coli] as the cause of diseases classified elsewhere: Secondary | ICD-10-CM | POA: Diagnosis not present

## 2024-09-12 DIAGNOSIS — I69354 Hemiplegia and hemiparesis following cerebral infarction affecting left non-dominant side: Secondary | ICD-10-CM | POA: Diagnosis not present

## 2024-09-12 DIAGNOSIS — Z431 Encounter for attention to gastrostomy: Secondary | ICD-10-CM | POA: Diagnosis not present

## 2024-09-12 DIAGNOSIS — Z931 Gastrostomy status: Secondary | ICD-10-CM | POA: Diagnosis not present

## 2024-09-12 DIAGNOSIS — E440 Moderate protein-calorie malnutrition: Secondary | ICD-10-CM | POA: Diagnosis not present

## 2024-09-12 DIAGNOSIS — E1151 Type 2 diabetes mellitus with diabetic peripheral angiopathy without gangrene: Secondary | ICD-10-CM | POA: Diagnosis not present

## 2024-09-12 DIAGNOSIS — N390 Urinary tract infection, site not specified: Secondary | ICD-10-CM | POA: Diagnosis not present

## 2024-09-12 DIAGNOSIS — F419 Anxiety disorder, unspecified: Secondary | ICD-10-CM | POA: Diagnosis not present

## 2024-09-16 DIAGNOSIS — H353232 Exudative age-related macular degeneration, bilateral, with inactive choroidal neovascularization: Secondary | ICD-10-CM | POA: Diagnosis not present

## 2024-09-16 DIAGNOSIS — H43813 Vitreous degeneration, bilateral: Secondary | ICD-10-CM | POA: Diagnosis not present

## 2024-09-16 DIAGNOSIS — H353124 Nonexudative age-related macular degeneration, left eye, advanced atrophic with subfoveal involvement: Secondary | ICD-10-CM | POA: Diagnosis not present

## 2024-09-17 DIAGNOSIS — G894 Chronic pain syndrome: Secondary | ICD-10-CM | POA: Diagnosis not present

## 2024-09-17 DIAGNOSIS — R1312 Dysphagia, oropharyngeal phase: Secondary | ICD-10-CM | POA: Diagnosis not present

## 2024-09-17 DIAGNOSIS — Z7409 Other reduced mobility: Secondary | ICD-10-CM | POA: Diagnosis not present

## 2024-09-17 DIAGNOSIS — Z23 Encounter for immunization: Secondary | ICD-10-CM | POA: Diagnosis not present

## 2024-09-19 DIAGNOSIS — F419 Anxiety disorder, unspecified: Secondary | ICD-10-CM | POA: Diagnosis not present

## 2024-09-19 DIAGNOSIS — J439 Emphysema, unspecified: Secondary | ICD-10-CM | POA: Diagnosis not present

## 2024-09-19 DIAGNOSIS — B9620 Unspecified Escherichia coli [E. coli] as the cause of diseases classified elsewhere: Secondary | ICD-10-CM | POA: Diagnosis not present

## 2024-09-19 DIAGNOSIS — I69354 Hemiplegia and hemiparesis following cerebral infarction affecting left non-dominant side: Secondary | ICD-10-CM | POA: Diagnosis not present

## 2024-09-19 DIAGNOSIS — E440 Moderate protein-calorie malnutrition: Secondary | ICD-10-CM | POA: Diagnosis not present

## 2024-09-19 DIAGNOSIS — E1151 Type 2 diabetes mellitus with diabetic peripheral angiopathy without gangrene: Secondary | ICD-10-CM | POA: Diagnosis not present

## 2024-09-19 DIAGNOSIS — Z431 Encounter for attention to gastrostomy: Secondary | ICD-10-CM | POA: Diagnosis not present

## 2024-09-19 DIAGNOSIS — F329 Major depressive disorder, single episode, unspecified: Secondary | ICD-10-CM | POA: Diagnosis not present

## 2024-09-23 DIAGNOSIS — B9620 Unspecified Escherichia coli [E. coli] as the cause of diseases classified elsewhere: Secondary | ICD-10-CM | POA: Diagnosis not present

## 2024-09-23 DIAGNOSIS — Z431 Encounter for attention to gastrostomy: Secondary | ICD-10-CM | POA: Diagnosis not present

## 2024-09-23 DIAGNOSIS — I69354 Hemiplegia and hemiparesis following cerebral infarction affecting left non-dominant side: Secondary | ICD-10-CM | POA: Diagnosis not present

## 2024-09-23 DIAGNOSIS — F419 Anxiety disorder, unspecified: Secondary | ICD-10-CM | POA: Diagnosis not present

## 2024-09-23 DIAGNOSIS — F329 Major depressive disorder, single episode, unspecified: Secondary | ICD-10-CM | POA: Diagnosis not present

## 2024-09-23 DIAGNOSIS — J439 Emphysema, unspecified: Secondary | ICD-10-CM | POA: Diagnosis not present

## 2024-09-23 DIAGNOSIS — N390 Urinary tract infection, site not specified: Secondary | ICD-10-CM | POA: Diagnosis not present

## 2024-09-23 DIAGNOSIS — E1151 Type 2 diabetes mellitus with diabetic peripheral angiopathy without gangrene: Secondary | ICD-10-CM | POA: Diagnosis not present

## 2024-09-23 DIAGNOSIS — E440 Moderate protein-calorie malnutrition: Secondary | ICD-10-CM | POA: Diagnosis not present

## 2024-09-24 DIAGNOSIS — N390 Urinary tract infection, site not specified: Secondary | ICD-10-CM | POA: Diagnosis not present

## 2024-09-24 DIAGNOSIS — J439 Emphysema, unspecified: Secondary | ICD-10-CM | POA: Diagnosis not present

## 2024-09-24 DIAGNOSIS — H353290 Exudative age-related macular degeneration, unspecified eye, stage unspecified: Secondary | ICD-10-CM | POA: Diagnosis not present

## 2024-09-24 DIAGNOSIS — Z431 Encounter for attention to gastrostomy: Secondary | ICD-10-CM | POA: Diagnosis not present

## 2024-09-24 DIAGNOSIS — E785 Hyperlipidemia, unspecified: Secondary | ICD-10-CM | POA: Diagnosis not present

## 2024-09-24 DIAGNOSIS — Z7982 Long term (current) use of aspirin: Secondary | ICD-10-CM | POA: Diagnosis not present

## 2024-09-24 DIAGNOSIS — N1831 Chronic kidney disease, stage 3a: Secondary | ICD-10-CM | POA: Diagnosis not present

## 2024-09-24 DIAGNOSIS — E1151 Type 2 diabetes mellitus with diabetic peripheral angiopathy without gangrene: Secondary | ICD-10-CM | POA: Diagnosis not present

## 2024-09-24 DIAGNOSIS — F329 Major depressive disorder, single episode, unspecified: Secondary | ICD-10-CM | POA: Diagnosis not present

## 2024-09-24 DIAGNOSIS — J449 Chronic obstructive pulmonary disease, unspecified: Secondary | ICD-10-CM | POA: Diagnosis not present

## 2024-09-24 DIAGNOSIS — K5900 Constipation, unspecified: Secondary | ICD-10-CM | POA: Diagnosis not present

## 2024-09-24 DIAGNOSIS — F419 Anxiety disorder, unspecified: Secondary | ICD-10-CM | POA: Diagnosis not present

## 2024-09-24 DIAGNOSIS — R32 Unspecified urinary incontinence: Secondary | ICD-10-CM | POA: Diagnosis not present

## 2024-09-24 DIAGNOSIS — E440 Moderate protein-calorie malnutrition: Secondary | ICD-10-CM | POA: Diagnosis not present

## 2024-09-24 DIAGNOSIS — G629 Polyneuropathy, unspecified: Secondary | ICD-10-CM | POA: Diagnosis not present

## 2024-09-24 DIAGNOSIS — I69354 Hemiplegia and hemiparesis following cerebral infarction affecting left non-dominant side: Secondary | ICD-10-CM | POA: Diagnosis not present

## 2024-09-24 DIAGNOSIS — B9620 Unspecified Escherichia coli [E. coli] as the cause of diseases classified elsewhere: Secondary | ICD-10-CM | POA: Diagnosis not present

## 2024-09-30 DIAGNOSIS — N390 Urinary tract infection, site not specified: Secondary | ICD-10-CM | POA: Diagnosis not present

## 2024-09-30 DIAGNOSIS — R1310 Dysphagia, unspecified: Secondary | ICD-10-CM | POA: Diagnosis not present

## 2024-09-30 DIAGNOSIS — E1151 Type 2 diabetes mellitus with diabetic peripheral angiopathy without gangrene: Secondary | ICD-10-CM | POA: Diagnosis not present

## 2024-09-30 DIAGNOSIS — I69354 Hemiplegia and hemiparesis following cerebral infarction affecting left non-dominant side: Secondary | ICD-10-CM | POA: Diagnosis not present

## 2024-09-30 DIAGNOSIS — Z431 Encounter for attention to gastrostomy: Secondary | ICD-10-CM | POA: Diagnosis not present

## 2024-09-30 DIAGNOSIS — F419 Anxiety disorder, unspecified: Secondary | ICD-10-CM | POA: Diagnosis not present

## 2024-09-30 DIAGNOSIS — J439 Emphysema, unspecified: Secondary | ICD-10-CM | POA: Diagnosis not present

## 2024-09-30 DIAGNOSIS — J449 Chronic obstructive pulmonary disease, unspecified: Secondary | ICD-10-CM | POA: Diagnosis not present

## 2024-09-30 DIAGNOSIS — I63511 Cerebral infarction due to unspecified occlusion or stenosis of right middle cerebral artery: Secondary | ICD-10-CM | POA: Diagnosis not present

## 2024-09-30 DIAGNOSIS — F329 Major depressive disorder, single episode, unspecified: Secondary | ICD-10-CM | POA: Diagnosis not present

## 2024-09-30 DIAGNOSIS — E440 Moderate protein-calorie malnutrition: Secondary | ICD-10-CM | POA: Diagnosis not present

## 2024-09-30 DIAGNOSIS — B9620 Unspecified Escherichia coli [E. coli] as the cause of diseases classified elsewhere: Secondary | ICD-10-CM | POA: Diagnosis not present

## 2024-10-01 DIAGNOSIS — E1151 Type 2 diabetes mellitus with diabetic peripheral angiopathy without gangrene: Secondary | ICD-10-CM | POA: Diagnosis not present

## 2024-10-01 DIAGNOSIS — F329 Major depressive disorder, single episode, unspecified: Secondary | ICD-10-CM | POA: Diagnosis not present

## 2024-10-01 DIAGNOSIS — I69391 Dysphagia following cerebral infarction: Secondary | ICD-10-CM | POA: Diagnosis not present

## 2024-10-01 DIAGNOSIS — R1312 Dysphagia, oropharyngeal phase: Secondary | ICD-10-CM | POA: Diagnosis not present

## 2024-10-01 DIAGNOSIS — F419 Anxiety disorder, unspecified: Secondary | ICD-10-CM | POA: Diagnosis not present

## 2024-10-01 DIAGNOSIS — I69354 Hemiplegia and hemiparesis following cerebral infarction affecting left non-dominant side: Secondary | ICD-10-CM | POA: Diagnosis not present

## 2024-10-01 DIAGNOSIS — J439 Emphysema, unspecified: Secondary | ICD-10-CM | POA: Diagnosis not present

## 2024-10-01 DIAGNOSIS — E440 Moderate protein-calorie malnutrition: Secondary | ICD-10-CM | POA: Diagnosis not present

## 2024-10-03 DIAGNOSIS — E1151 Type 2 diabetes mellitus with diabetic peripheral angiopathy without gangrene: Secondary | ICD-10-CM | POA: Diagnosis not present

## 2024-10-03 DIAGNOSIS — F419 Anxiety disorder, unspecified: Secondary | ICD-10-CM | POA: Diagnosis not present

## 2024-10-03 DIAGNOSIS — E559 Vitamin D deficiency, unspecified: Secondary | ICD-10-CM | POA: Diagnosis not present

## 2024-10-03 DIAGNOSIS — E440 Moderate protein-calorie malnutrition: Secondary | ICD-10-CM | POA: Diagnosis not present

## 2024-10-03 DIAGNOSIS — R1312 Dysphagia, oropharyngeal phase: Secondary | ICD-10-CM | POA: Diagnosis not present

## 2024-10-03 DIAGNOSIS — F329 Major depressive disorder, single episode, unspecified: Secondary | ICD-10-CM | POA: Diagnosis not present

## 2024-10-03 DIAGNOSIS — I69354 Hemiplegia and hemiparesis following cerebral infarction affecting left non-dominant side: Secondary | ICD-10-CM | POA: Diagnosis not present

## 2024-10-03 DIAGNOSIS — I69391 Dysphagia following cerebral infarction: Secondary | ICD-10-CM | POA: Diagnosis not present

## 2024-10-03 DIAGNOSIS — J439 Emphysema, unspecified: Secondary | ICD-10-CM | POA: Diagnosis not present

## 2024-10-05 ENCOUNTER — Other Ambulatory Visit: Payer: Self-pay | Admitting: Physical Medicine and Rehabilitation

## 2024-10-09 DIAGNOSIS — Z431 Encounter for attention to gastrostomy: Secondary | ICD-10-CM | POA: Diagnosis not present

## 2024-10-09 DIAGNOSIS — E559 Vitamin D deficiency, unspecified: Secondary | ICD-10-CM | POA: Diagnosis not present

## 2024-10-09 DIAGNOSIS — J449 Chronic obstructive pulmonary disease, unspecified: Secondary | ICD-10-CM | POA: Diagnosis not present

## 2024-10-09 DIAGNOSIS — M62838 Other muscle spasm: Secondary | ICD-10-CM | POA: Diagnosis not present

## 2024-10-09 DIAGNOSIS — K648 Other hemorrhoids: Secondary | ICD-10-CM | POA: Diagnosis not present

## 2024-10-09 DIAGNOSIS — E782 Mixed hyperlipidemia: Secondary | ICD-10-CM | POA: Diagnosis not present

## 2024-10-09 DIAGNOSIS — F332 Major depressive disorder, recurrent severe without psychotic features: Secondary | ICD-10-CM | POA: Diagnosis not present

## 2024-10-09 DIAGNOSIS — Z8673 Personal history of transient ischemic attack (TIA), and cerebral infarction without residual deficits: Secondary | ICD-10-CM | POA: Diagnosis not present

## 2024-10-09 DIAGNOSIS — R059 Cough, unspecified: Secondary | ICD-10-CM | POA: Diagnosis not present

## 2024-10-09 DIAGNOSIS — R918 Other nonspecific abnormal finding of lung field: Secondary | ICD-10-CM | POA: Diagnosis not present

## 2024-10-10 DIAGNOSIS — I69354 Hemiplegia and hemiparesis following cerebral infarction affecting left non-dominant side: Secondary | ICD-10-CM | POA: Diagnosis not present

## 2024-10-11 DIAGNOSIS — E440 Moderate protein-calorie malnutrition: Secondary | ICD-10-CM | POA: Diagnosis not present

## 2024-10-11 DIAGNOSIS — R1312 Dysphagia, oropharyngeal phase: Secondary | ICD-10-CM | POA: Diagnosis not present

## 2024-10-11 DIAGNOSIS — E1151 Type 2 diabetes mellitus with diabetic peripheral angiopathy without gangrene: Secondary | ICD-10-CM | POA: Diagnosis not present

## 2024-10-11 DIAGNOSIS — I69391 Dysphagia following cerebral infarction: Secondary | ICD-10-CM | POA: Diagnosis not present

## 2024-10-11 DIAGNOSIS — E559 Vitamin D deficiency, unspecified: Secondary | ICD-10-CM | POA: Diagnosis not present

## 2024-10-11 DIAGNOSIS — J439 Emphysema, unspecified: Secondary | ICD-10-CM | POA: Diagnosis not present

## 2024-10-11 DIAGNOSIS — F419 Anxiety disorder, unspecified: Secondary | ICD-10-CM | POA: Diagnosis not present

## 2024-10-11 DIAGNOSIS — I69354 Hemiplegia and hemiparesis following cerebral infarction affecting left non-dominant side: Secondary | ICD-10-CM | POA: Diagnosis not present

## 2024-10-11 DIAGNOSIS — F329 Major depressive disorder, single episode, unspecified: Secondary | ICD-10-CM | POA: Diagnosis not present

## 2024-10-13 DIAGNOSIS — F419 Anxiety disorder, unspecified: Secondary | ICD-10-CM | POA: Diagnosis not present

## 2024-10-13 DIAGNOSIS — E1151 Type 2 diabetes mellitus with diabetic peripheral angiopathy without gangrene: Secondary | ICD-10-CM | POA: Diagnosis not present

## 2024-10-13 DIAGNOSIS — E440 Moderate protein-calorie malnutrition: Secondary | ICD-10-CM | POA: Diagnosis not present

## 2024-10-13 DIAGNOSIS — I69354 Hemiplegia and hemiparesis following cerebral infarction affecting left non-dominant side: Secondary | ICD-10-CM | POA: Diagnosis not present

## 2024-10-13 DIAGNOSIS — F329 Major depressive disorder, single episode, unspecified: Secondary | ICD-10-CM | POA: Diagnosis not present

## 2024-10-13 DIAGNOSIS — E559 Vitamin D deficiency, unspecified: Secondary | ICD-10-CM | POA: Diagnosis not present

## 2024-10-13 DIAGNOSIS — I69391 Dysphagia following cerebral infarction: Secondary | ICD-10-CM | POA: Diagnosis not present

## 2024-10-13 DIAGNOSIS — R1312 Dysphagia, oropharyngeal phase: Secondary | ICD-10-CM | POA: Diagnosis not present

## 2024-10-16 ENCOUNTER — Inpatient Hospital Stay: Admitting: Neurology

## 2024-10-20 ENCOUNTER — Telehealth: Payer: Self-pay

## 2024-10-20 NOTE — Patient Outreach (Signed)
 Returned call in attempt to obtain mRS. No answer. Left message for returned call.  [facility]  Population Health Care Management Assistant  Direct Dial: [phone]  Fax: [phone] Website: [email or web address]

## 2024-10-20 NOTE — Patient Outreach (Signed)
 First telephone outreach attempt to obtain mRS. Daughter answered and asked to call back later.   [facility]  Population Health Care Management Assistant  Direct Dial: [phone]  Fax: [phone] Website: [email or web address]

## 2024-10-21 ENCOUNTER — Observation Stay (HOSPITAL_COMMUNITY)

## 2024-10-21 ENCOUNTER — Emergency Department (HOSPITAL_COMMUNITY)

## 2024-10-21 ENCOUNTER — Telehealth: Payer: Self-pay

## 2024-10-21 ENCOUNTER — Encounter (HOSPITAL_COMMUNITY): Payer: Self-pay

## 2024-10-21 ENCOUNTER — Inpatient Hospital Stay (HOSPITAL_COMMUNITY)
Admission: EM | Admit: 2024-10-21 | Discharge: 2024-10-24 | DRG: 193 | Disposition: A | Discharge status: Home / Self Care | Attending: Internal Medicine | Admitting: Internal Medicine

## 2024-10-21 ENCOUNTER — Other Ambulatory Visit: Payer: Self-pay

## 2024-10-21 DIAGNOSIS — R410 Disorientation, unspecified: Secondary | ICD-10-CM | POA: Diagnosis not present

## 2024-10-21 DIAGNOSIS — R4182 Altered mental status, unspecified: Secondary | ICD-10-CM | POA: Diagnosis present

## 2024-10-21 DIAGNOSIS — I10 Essential (primary) hypertension: Secondary | ICD-10-CM | POA: Diagnosis present

## 2024-10-21 DIAGNOSIS — G319 Degenerative disease of nervous system, unspecified: Secondary | ICD-10-CM | POA: Diagnosis not present

## 2024-10-21 DIAGNOSIS — T43295A Adverse effect of other antidepressants, initial encounter: Secondary | ICD-10-CM | POA: Diagnosis present

## 2024-10-21 DIAGNOSIS — Z803 Family history of malignant neoplasm of breast: Secondary | ICD-10-CM

## 2024-10-21 DIAGNOSIS — I491 Atrial premature depolarization: Secondary | ICD-10-CM | POA: Diagnosis not present

## 2024-10-21 DIAGNOSIS — I700 Atherosclerosis of aorta: Secondary | ICD-10-CM | POA: Diagnosis not present

## 2024-10-21 DIAGNOSIS — Z7982 Long term (current) use of aspirin: Secondary | ICD-10-CM

## 2024-10-21 DIAGNOSIS — I63511 Cerebral infarction due to unspecified occlusion or stenosis of right middle cerebral artery: Secondary | ICD-10-CM | POA: Diagnosis not present

## 2024-10-21 DIAGNOSIS — E869 Volume depletion, unspecified: Secondary | ICD-10-CM

## 2024-10-21 DIAGNOSIS — Z91040 Latex allergy status: Secondary | ICD-10-CM | POA: Diagnosis not present

## 2024-10-21 DIAGNOSIS — Z66 Do not resuscitate: Secondary | ICD-10-CM | POA: Diagnosis present

## 2024-10-21 DIAGNOSIS — Z931 Gastrostomy status: Secondary | ICD-10-CM | POA: Diagnosis not present

## 2024-10-21 DIAGNOSIS — R509 Fever, unspecified: Secondary | ICD-10-CM | POA: Diagnosis not present

## 2024-10-21 DIAGNOSIS — G4733 Obstructive sleep apnea (adult) (pediatric): Secondary | ICD-10-CM | POA: Diagnosis present

## 2024-10-21 DIAGNOSIS — F22 Delusional disorders: Secondary | ICD-10-CM | POA: Diagnosis not present

## 2024-10-21 DIAGNOSIS — Z79899 Other long term (current) drug therapy: Secondary | ICD-10-CM | POA: Diagnosis not present

## 2024-10-21 DIAGNOSIS — Z833 Family history of diabetes mellitus: Secondary | ICD-10-CM

## 2024-10-21 DIAGNOSIS — E440 Moderate protein-calorie malnutrition: Secondary | ICD-10-CM | POA: Insufficient documentation

## 2024-10-21 DIAGNOSIS — Z859 Personal history of malignant neoplasm, unspecified: Secondary | ICD-10-CM | POA: Diagnosis not present

## 2024-10-21 DIAGNOSIS — Z888 Allergy status to other drugs, medicaments and biological substances status: Secondary | ICD-10-CM

## 2024-10-21 DIAGNOSIS — Z1152 Encounter for screening for COVID-19: Secondary | ICD-10-CM | POA: Diagnosis not present

## 2024-10-21 DIAGNOSIS — G8192 Hemiplegia, unspecified affecting left dominant side: Secondary | ICD-10-CM | POA: Diagnosis not present

## 2024-10-21 DIAGNOSIS — E860 Dehydration: Secondary | ICD-10-CM | POA: Diagnosis present

## 2024-10-21 DIAGNOSIS — T43695A Adverse effect of other psychostimulants, initial encounter: Secondary | ICD-10-CM | POA: Diagnosis present

## 2024-10-21 DIAGNOSIS — J189 Pneumonia, unspecified organism: Secondary | ICD-10-CM

## 2024-10-21 DIAGNOSIS — G9341 Metabolic encephalopathy: Secondary | ICD-10-CM | POA: Diagnosis not present

## 2024-10-21 DIAGNOSIS — J129 Viral pneumonia, unspecified: Principal | ICD-10-CM | POA: Diagnosis present

## 2024-10-21 DIAGNOSIS — R569 Unspecified convulsions: Secondary | ICD-10-CM | POA: Diagnosis not present

## 2024-10-21 DIAGNOSIS — I69354 Hemiplegia and hemiparesis following cerebral infarction affecting left non-dominant side: Secondary | ICD-10-CM

## 2024-10-21 DIAGNOSIS — R457 State of emotional shock and stress, unspecified: Secondary | ICD-10-CM | POA: Diagnosis not present

## 2024-10-21 DIAGNOSIS — J440 Chronic obstructive pulmonary disease with acute lower respiratory infection: Secondary | ICD-10-CM | POA: Diagnosis present

## 2024-10-21 DIAGNOSIS — Z8673 Personal history of transient ischemic attack (TIA), and cerebral infarction without residual deficits: Secondary | ICD-10-CM | POA: Diagnosis not present

## 2024-10-21 DIAGNOSIS — Z8249 Family history of ischemic heart disease and other diseases of the circulatory system: Secondary | ICD-10-CM | POA: Diagnosis not present

## 2024-10-21 DIAGNOSIS — I771 Stricture of artery: Secondary | ICD-10-CM | POA: Diagnosis not present

## 2024-10-21 DIAGNOSIS — E1151 Type 2 diabetes mellitus with diabetic peripheral angiopathy without gangrene: Secondary | ICD-10-CM | POA: Diagnosis not present

## 2024-10-21 DIAGNOSIS — Z6827 Body mass index (BMI) 27.0-27.9, adult: Secondary | ICD-10-CM

## 2024-10-21 DIAGNOSIS — G459 Transient cerebral ischemic attack, unspecified: Secondary | ICD-10-CM | POA: Diagnosis not present

## 2024-10-21 DIAGNOSIS — F419 Anxiety disorder, unspecified: Secondary | ICD-10-CM | POA: Diagnosis not present

## 2024-10-21 DIAGNOSIS — Z823 Family history of stroke: Secondary | ICD-10-CM | POA: Diagnosis not present

## 2024-10-21 DIAGNOSIS — G928 Other toxic encephalopathy: Secondary | ICD-10-CM | POA: Diagnosis present

## 2024-10-21 DIAGNOSIS — E876 Hypokalemia: Secondary | ICD-10-CM | POA: Diagnosis present

## 2024-10-21 DIAGNOSIS — E559 Vitamin D deficiency, unspecified: Secondary | ICD-10-CM | POA: Diagnosis not present

## 2024-10-21 DIAGNOSIS — I69391 Dysphagia following cerebral infarction: Secondary | ICD-10-CM | POA: Diagnosis not present

## 2024-10-21 DIAGNOSIS — S2249XA Multiple fractures of ribs, unspecified side, initial encounter for closed fracture: Secondary | ICD-10-CM | POA: Diagnosis not present

## 2024-10-21 DIAGNOSIS — J439 Emphysema, unspecified: Secondary | ICD-10-CM | POA: Diagnosis present

## 2024-10-21 DIAGNOSIS — F329 Major depressive disorder, single episode, unspecified: Secondary | ICD-10-CM | POA: Diagnosis not present

## 2024-10-21 DIAGNOSIS — J069 Acute upper respiratory infection, unspecified: Secondary | ICD-10-CM | POA: Diagnosis not present

## 2024-10-21 DIAGNOSIS — J449 Chronic obstructive pulmonary disease, unspecified: Secondary | ICD-10-CM | POA: Diagnosis not present

## 2024-10-21 DIAGNOSIS — E43 Unspecified severe protein-calorie malnutrition: Secondary | ICD-10-CM | POA: Diagnosis present

## 2024-10-21 DIAGNOSIS — Z923 Personal history of irradiation: Secondary | ICD-10-CM

## 2024-10-21 DIAGNOSIS — G9389 Other specified disorders of brain: Secondary | ICD-10-CM | POA: Diagnosis not present

## 2024-10-21 DIAGNOSIS — R1312 Dysphagia, oropharyngeal phase: Secondary | ICD-10-CM | POA: Diagnosis not present

## 2024-10-21 DIAGNOSIS — F40240 Claustrophobia: Secondary | ICD-10-CM | POA: Diagnosis present

## 2024-10-21 DIAGNOSIS — Z87891 Personal history of nicotine dependence: Secondary | ICD-10-CM

## 2024-10-21 DIAGNOSIS — Z7401 Bed confinement status: Secondary | ICD-10-CM | POA: Diagnosis not present

## 2024-10-21 DIAGNOSIS — R059 Cough, unspecified: Secondary | ICD-10-CM | POA: Diagnosis not present

## 2024-10-21 DIAGNOSIS — T43225A Adverse effect of selective serotonin reuptake inhibitors, initial encounter: Secondary | ICD-10-CM | POA: Diagnosis present

## 2024-10-21 LAB — URINALYSIS, ROUTINE W REFLEX MICROSCOPIC
Bilirubin Urine: NEGATIVE
Glucose, UA: NEGATIVE mg/dL
Hgb urine dipstick: NEGATIVE
Ketones, ur: NEGATIVE mg/dL
Leukocytes,Ua: NEGATIVE
Nitrite: NEGATIVE
Protein, ur: NEGATIVE mg/dL
Specific Gravity, Urine: 1.015 (ref 1.005–1.030)
pH: 7 (ref 5.0–8.0)

## 2024-10-21 LAB — CBC
HCT: 38.4 % (ref 36.0–46.0)
Hemoglobin: 12.7 g/dL (ref 12.0–15.0)
MCH: 31.6 pg (ref 26.0–34.0)
MCHC: 33.1 g/dL (ref 30.0–36.0)
MCV: 95.5 fL (ref 80.0–100.0)
Platelets: 176 K/uL (ref 150–400)
RBC: 4.02 MIL/uL (ref 3.87–5.11)
RDW: 13.4 % (ref 11.5–15.5)
WBC: 10 K/uL (ref 4.0–10.5)
nRBC: 0 % (ref 0.0–0.2)

## 2024-10-21 LAB — RESP PANEL BY RT-PCR (RSV, FLU A&B, COVID)  RVPGX2
Influenza A by PCR: NEGATIVE
Influenza B by PCR: NEGATIVE
Resp Syncytial Virus by PCR: NEGATIVE
SARS Coronavirus 2 by RT PCR: NEGATIVE

## 2024-10-21 LAB — COMPREHENSIVE METABOLIC PANEL WITH GFR
ALT: 16 U/L (ref 0–44)
AST: 19 U/L (ref 15–41)
Albumin: 3.2 g/dL — ABNORMAL LOW (ref 3.5–5.0)
Alkaline Phosphatase: 87 U/L (ref 38–126)
Anion gap: 10 (ref 5–15)
BUN: 15 mg/dL (ref 8–23)
CO2: 29 mmol/L (ref 22–32)
Calcium: 9.1 mg/dL (ref 8.9–10.3)
Chloride: 101 mmol/L (ref 98–111)
Creatinine, Ser: 0.57 mg/dL (ref 0.44–1.00)
GFR, Estimated: >60 mL/min (ref >60)
Glucose, Bld: 106 mg/dL — ABNORMAL HIGH (ref 70–99)
Potassium: 3.7 mmol/L (ref 3.5–5.1)
Sodium: 140 mmol/L (ref 135–145)
Total Bilirubin: 0.3 mg/dL (ref 0.0–1.2)
Total Protein: 6.3 g/dL — ABNORMAL LOW (ref 6.5–8.1)

## 2024-10-21 LAB — CBG MONITORING, ED: Glucose-Capillary: 118 mg/dL — ABNORMAL HIGH (ref 70–99)

## 2024-10-21 MED ORDER — MELATONIN 5 MG PO TABS
5.0000 mg | ORAL_TABLET | Freq: Every day | ORAL | Status: DC
  Filled 2024-10-21: qty 1

## 2024-10-21 MED ORDER — SODIUM CHLORIDE 0.9 % IV SOLN: INTRAVENOUS | Status: AC

## 2024-10-21 MED ORDER — BUPROPION HCL 75 MG PO TABS
75.0000 mg | ORAL_TABLET | Freq: Two times a day (BID) | ORAL | Status: DC
  Administered 2024-10-21 – 2024-10-24 (×5): 75 mg
  Filled 2024-10-21 (×5): qty 1

## 2024-10-21 MED ORDER — BUSPIRONE HCL 10 MG PO TABS
5.0000 mg | ORAL_TABLET | Freq: Three times a day (TID) | ORAL | Status: DC | PRN
  Administered 2024-10-24: 5 mg
  Filled 2024-10-21: qty 1

## 2024-10-21 MED ORDER — NITROGLYCERIN 0.4 MG SL SUBL: 0.4000 mg | SUBLINGUAL_TABLET | SUBLINGUAL | Status: DC | PRN

## 2024-10-21 MED ORDER — HEPARIN SODIUM (PORCINE) 5000 UNIT/ML IJ SOLN
5000.0000 [IU] | Freq: Three times a day (TID) | INTRAMUSCULAR | Status: DC
  Administered 2024-10-21 – 2024-10-24 (×8): 5000 [IU] via SUBCUTANEOUS
  Filled 2024-10-21 (×8): qty 1

## 2024-10-21 MED ORDER — MODAFINIL 100 MG PO TABS
100.0000 mg | ORAL_TABLET | Freq: Every day | ORAL | Status: DC
  Administered 2024-10-22: 100 mg
  Filled 2024-10-21: qty 1

## 2024-10-21 MED ORDER — LORAZEPAM 2 MG/ML IJ SOLN: 1.0000 mg | Freq: Once | INTRAMUSCULAR | Status: DC | PRN

## 2024-10-21 MED ORDER — TRAZODONE HCL 50 MG PO TABS: 25.0000 mg | ORAL_TABLET | Freq: Every evening | ORAL | Status: DC | PRN

## 2024-10-21 MED ORDER — LORAZEPAM 2 MG/ML IJ SOLN
0.5000 mg | Freq: Once | INTRAMUSCULAR | Status: AC
  Administered 2024-10-21: 0.5 mg via INTRAVENOUS
  Filled 2024-10-21: qty 1

## 2024-10-21 MED ORDER — ATORVASTATIN CALCIUM 10 MG PO TABS
10.0000 mg | ORAL_TABLET | Freq: Every day | ORAL | Status: DC
  Filled 2024-10-21: qty 1

## 2024-10-21 MED ORDER — ONDANSETRON HCL 4 MG PO TABS
4.0000 mg | ORAL_TABLET | Freq: Four times a day (QID) | ORAL | Status: DC | PRN
Start: 2024-10-21 — End: 2024-10-24

## 2024-10-21 MED ORDER — FREE WATER
120.0000 mL | Freq: Three times a day (TID) | Status: DC
  Administered 2024-10-21 – 2024-10-22 (×2): 120 mL

## 2024-10-21 MED ORDER — ALBUTEROL SULFATE (2.5 MG/3ML) 0.083% IN NEBU
2.5000 mg | INHALATION_SOLUTION | RESPIRATORY_TRACT | Status: DC | PRN
Start: 2024-10-21 — End: 2024-10-24
  Administered 2024-10-23: 2.5 mg via RESPIRATORY_TRACT
  Filled 2024-10-21: qty 3

## 2024-10-21 MED ORDER — BUPROPION HCL 75 MG PO TABS
75.0000 mg | ORAL_TABLET | Freq: Two times a day (BID) | ORAL | Status: DC
  Filled 2024-10-21: qty 1

## 2024-10-21 MED ORDER — ESCITALOPRAM OXALATE 10 MG PO TABS: 40.0000 mg | ORAL_TABLET | Freq: Every day | ORAL | Status: DC

## 2024-10-21 MED ORDER — BUSPIRONE HCL 10 MG PO TABS: 5.0000 mg | ORAL_TABLET | Freq: Three times a day (TID) | ORAL | Status: DC | PRN

## 2024-10-21 MED ORDER — HYDRALAZINE HCL 20 MG/ML IJ SOLN: 10.0000 mg | Freq: Four times a day (QID) | INTRAMUSCULAR | Status: DC | PRN

## 2024-10-21 MED ORDER — ASPIRIN 81 MG PO TBEC: 81.0000 mg | DELAYED_RELEASE_TABLET | Freq: Every day | ORAL | Status: DC

## 2024-10-21 MED ORDER — POLYVINYL ALCOHOL 1.4 % OP SOLN
1.0000 [drp] | OPHTHALMIC | Status: DC | PRN
Start: 2024-10-21 — End: 2024-10-24

## 2024-10-21 MED ORDER — ESCITALOPRAM OXALATE 10 MG PO TABS
40.0000 mg | ORAL_TABLET | Freq: Every day | ORAL | Status: DC
  Administered 2024-10-22 – 2024-10-24 (×3): 40 mg
  Filled 2024-10-21 (×3): qty 4

## 2024-10-21 MED ORDER — MODAFINIL 100 MG PO TABS: 100.0000 mg | ORAL_TABLET | Freq: Every day | ORAL | Status: DC

## 2024-10-21 MED ORDER — ATORVASTATIN CALCIUM 10 MG PO TABS
10.0000 mg | ORAL_TABLET | Freq: Every day | ORAL | Status: DC
  Administered 2024-10-21 – 2024-10-23 (×2): 10 mg
  Filled 2024-10-21 (×2): qty 1

## 2024-10-21 MED ORDER — SODIUM CHLORIDE 0.9 % IV BOLUS
1000.0000 mL | Freq: Once | INTRAVENOUS | Status: AC
  Administered 2024-10-21: 1000 mL via INTRAVENOUS

## 2024-10-21 MED ORDER — ONDANSETRON HCL 4 MG/2ML IJ SOLN: 4.0000 mg | Freq: Four times a day (QID) | INTRAMUSCULAR | Status: DC | PRN

## 2024-10-21 MED ORDER — HALOPERIDOL LACTATE 5 MG/ML IJ SOLN
2.0000 mg | Freq: Four times a day (QID) | INTRAMUSCULAR | Status: DC | PRN
  Administered 2024-10-22: 2 mg via INTRAVENOUS
  Filled 2024-10-21: qty 1

## 2024-10-21 MED ORDER — POLYETHYLENE GLYCOL 3350 17 G PO PACK
17.0000 g | PACK | Freq: Every day | ORAL | Status: DC | PRN
Start: 2024-10-21 — End: 2024-10-24

## 2024-10-21 MED ORDER — MELATONIN 5 MG PO TABS
5.0000 mg | ORAL_TABLET | Freq: Every day | ORAL | Status: DC
  Administered 2024-10-21 – 2024-10-23 (×2): 5 mg
  Filled 2024-10-21 (×2): qty 1

## 2024-10-21 NOTE — ED Provider Notes (Signed)
 Received patient in turnover from Dr. Franklyn.  Please see their note for further details of Hx, PE.  Briefly patient is a 89 y.o. female with a Altered Mental Status .  AMS going on for about 48 hours.  Recently on abx for pna.  Plan for UA.  Likely admit post. UA negative for infection.  Discussed with medicine for admission.     Emil Share, DO 10/21/24 1911

## 2024-10-21 NOTE — Plan of Care (Signed)

## 2024-10-21 NOTE — ED Notes (Signed)
 Urine occurrence. Pt changed into new brief and gown. Warm blankets given.

## 2024-10-21 NOTE — H&P (Addendum)
 History and Physical    Patient: [name] FMW:[id number] DOB: 1932/03/03 DOA: 10/21/2024 DOS: the patient was seen and examined on 10/21/2024 PCP: [name], MD  Patient coming from: Home  Chief Complaint:  Chief Complaint  Patient presents with   Altered Mental Status    HPI: [name] is a 89 y.o. female with history of CVA with residual left hemiparesis, OSA, emphysema, depression/anxiety, presenting with cough and altered mental status.  History of corroborated by daughter who is at bedside.  Patient herself is alert and oriented x 3, states that she has had a dry cough since last Thursday.  Reports that other family members and caretakers had similar symptoms.  Denies any fever, purulent sputum, shortness of breath, chest pain, nausea, vomiting, abdominal pain.  Patient currently uses PEG tube for feedings but is encouraged to take p.o.  Patient has not had any desire to take p.o. food/water .  Denies any change in medication regimen.  Per reports from family however the patient has had increased agitation, delirium, and paranoia.  Patient was confused stating that she was not in her own house, accusing her family of paying people to say that she was in a different house.  Could not be redirected or reassured.  Otherwise was alert and talkative.  Upon evaluation emergency department, patient is alert and oriented x 3.  CT head negative for acute infarct but shows chronic large right MCA territory infarct and chronic small vessel disease.  Chest x-ray largely unremarkable.  COVID/flu/RSV negative.  WBCs 10.  Review of Systems: As mentioned in the history of present illness. All other systems reviewed and are negative. Past Medical History:  Diagnosis Date   Anxiety    Back pain    Cancer (HCC)    Depression    Emphysema    Esophageal spasm    Lung nodule    OSA (obstructive sleep apnea)    Osteomalacia    Personal history of radiation therapy    Stroke (HCC)     Vertigo    Vitamin D  deficiency    Past Surgical History:  Procedure Laterality Date   ABDOMINAL HYSTERECTOMY     APPENDECTOMY     BREAST BIOPSY Right 08/31/2020   BREAST EXCISIONAL BIOPSY Left 1978   BREAST LUMPECTOMY Right 09/30/2020   BREAST LUMPECTOMY WITH RADIOACTIVE SEED LOCALIZATION Right 09/30/2020   Procedure: RIGHT BREAST LUMPECTOMY WITH RADIOACTIVE SEED LOCALIZATION;  Surgeon: Curvin Deward MOULD, MD;  Location: [facility];  Service: General;  Laterality: Right;   CARPAL TUNNEL RELEASE     IR CT HEAD LTD  06/25/2024   IR GASTROSTOMY TUBE MOD SED  07/14/2024   IR PERCUTANEOUS ART THROMBECTOMY/INFUSION INTRACRANIAL INC DIAG ANGIO  06/25/2024   RADIOLOGY WITH ANESTHESIA N/A 06/25/2024   Procedure: RADIOLOGY WITH ANESTHESIA;  Surgeon: Dolphus Carrion, MD;  Location: [facility] OR;  Service: Radiology;  Laterality: N/A;   ROTATOR CUFF REPAIR     Social History:  reports that she quit smoking about 8 years ago. Her smoking use included cigarettes. She started smoking about 64 years ago. She has a 56 pack-year smoking history. She has never used smokeless tobacco. She reports that she does not drink alcohol  and does not use drugs.  Allergies  Allergen Reactions   Adhesive [Tape] Itching and Other (See Comments)    Little red itchy bumps develop   Ativan  [Lorazepam ] Anxiety and Other (See Comments)    Family states makes her go crazy   Latex  Itching and Other (See Comments)    Little red itchy bumps develop    Family History  Problem Relation Age of Onset   Diabetes type II Other    Hypertension Other    Breast cancer Maternal Aunt        dx early 64s   Other Mother        died of natural causes at age 28   Other Father        stroke or heart attack while in shower   Diabetes type II Sister    Cancer Paternal Uncle        unknown type; dx early 49s    Prior to Admission medications   Medication Sig Start Date End Date Taking? Authorizing Provider  acetaminophen   (TYLENOL ) 650 MG CR tablet Take 1 tablet (650 mg total) by mouth 3 (three) times daily. 07/31/24   Love, Sharlet RAMAN, PA-C  albuterol  (PROVENTIL  HFA;VENTOLIN  HFA) 108 (90 Base) MCG/ACT inhaler Inhale 2 puffs into the lungs every 6 (six) hours as needed for wheezing or shortness of breath.    [provider]  arformoterol  (BROVANA ) 15 MCG/2ML NEBU Take 2 mLs (15 mcg total) by nebulization 2 (two) times daily. 08/07/24   Raulkar, Sven SQUIBB, MD  artificial tears ophthalmic solution Place 1 drop into both eyes as needed for dry eyes. 07/08/24   de Clint Kill, Cortney E, NP  aspirin  EC 81 MG tablet Take 81 mg by mouth daily.    [provider]  atorvastatin  (LIPITOR) 10 MG tablet Take 1 tablet (10 mg total) by mouth at bedtime. 07/31/24   Love, Sharlet RAMAN, PA-C  Baclofen  5 MG TABS Take 1 tablet (5 mg total) by mouth 2 (two) times daily as needed. 08/07/24   Raulkar, Sven SQUIBB, MD  benzocaine  (ORAJEL) 10 % mucosal gel Use as directed in the mouth or throat 2 (two) times daily as needed for mouth pain. 07/31/24   Love, Sharlet RAMAN, PA-C  buPROPion  (WELLBUTRIN ) 75 MG tablet Take 1 tablet (75 mg total) by mouth 2 (two) times daily. 07/31/24   Love, Sharlet RAMAN, PA-C  busPIRone  (BUSPAR ) 5 MG tablet Take 1 tablet (5 mg total) by mouth 3 (three) times daily as needed (Anxiety). 07/31/24   Love, Sharlet RAMAN, PA-C  camphor-menthol  [facility]) lotion Apply topically as needed for itching. 07/31/24   Love, Sharlet RAMAN, PA-C  Cholecalciferol  (VITAMIN D ) 50 MCG (2000 UT) tablet Take 1 tablet (2,000 Units total) by mouth daily. 07/31/24   Love, Sharlet RAMAN, PA-C  diclofenac  Sodium (VOLTAREN ) 1 % GEL Apply 2 g topically 4 (four) times daily. 07/31/24   Love, Sharlet RAMAN, PA-C  escitalopram  (LEXAPRO ) 20 MG tablet Take 2 tablets (40 mg total) by mouth daily. 07/31/24   Love, Sharlet RAMAN, PA-C  gabapentin  (NEURONTIN ) 300 MG capsule Take 1 capsule (300 mg total) by mouth 3 (three) times daily as needed. 08/14/24   Onita Duos, MD  l-methylfolate-B6-B12  (METANX) 3-35-2 MG TABS tablet Take 1 tablet by mouth 2 (two) times daily. 07/31/24   Love, Sharlet RAMAN, PA-C  lamoTRIgine  (LAMICTAL ) 25 MG tablet One tab bid xone week Then 2 tabs bid 08/28/24   Onita Duos, MD  lidocaine  (LIDODERM ) 5 % Place 2 patches onto the skin daily. Remove & Discard patch within 12 hours or as directed by MD 07/31/24   Love, Sharlet RAMAN, PA-C  melatonin 5 MG TABS Take 1 tablet (5 mg total) by mouth at bedtime. 07/31/24   Maurice Sharlet  S, PA-C  Menthol , Topical Analgesic, 4 % GEL Apply 1 application  topically 4 (four) times daily. 07/31/24   Love, Sharlet RAMAN, PA-C  modafinil  (PROVIGIL ) 100 MG tablet Take 1 tablet (100 mg total) by mouth daily. 08/14/24   Onita Duos, MD  Mouthwashes (MOUTH RINSE) LIQD solution 15 mLs by Mouth Rinse route as needed (oral care). 07/31/24   Love, Sharlet RAMAN, PA-C  Multiple Vitamin (MULTIVITAMIN WITH MINERALS) TABS tablet Take 1 tablet by mouth daily.    [provider]  nitroGLYCERIN  (NITROSTAT ) 0.4 MG SL tablet Place 1 tablet (0.4 mg total) under the tongue every 5 (five) minutes as needed for chest pain. 07/08/24   de Clint Kill, Cortney E, NP  Nutritional Supplements (FEEDING SUPPLEMENT, OSMOLITE 1.5 CAL,) LIQD Place 237 mLs into feeding tube 2 (two) times daily. 07/31/24   Love, Sharlet RAMAN, PA-C  Nystatin (GERHARDT'S BUTT CREAM) CREA Apply 1 Application topically 3 (three) times daily. 07/08/24   de Clint Kill, Cortney E, NP  polyethylene glycol (MIRALAX  / GLYCOLAX ) 17 g packet Place 17 g into feeding tube daily as needed for mild constipation. 07/08/24   de Clint Kill, Cortney E, NP  senna (SENOKOT) 8.6 MG TABS tablet Take 1 tablet (8.6 mg total) by mouth daily. 08/01/24   Love, Sharlet RAMAN, PA-C  traMADol  (ULTRAM ) 50 MG tablet Take 0.5-1 tablets (25-50 mg total) by mouth every 4 (four) hours as needed for severe pain (pain score 7-10). 07/31/24   Love, Sharlet RAMAN, PA-C  Water  For Irrigation, Sterile (FREE WATER ) SOLN Place 120 mLs into feeding tube 3 (three) times  daily. 07/31/24   Love, Sharlet RAMAN, PA-C  YUPELRI  175 MCG/3ML nebulizer solution TAKE 3 MLS (175 MCG TOTAL) BY NEBULIZATION DAILY. 10/08/24   Lorilee Sven SQUIBB, MD    Physical Exam:  Vitals:   10/21/24 1315 10/21/24 1405 10/21/24 1515 10/21/24 1638  BP: 127/86 (!) 133/101 (!) 143/92 (!) 161/93  Pulse: 74 72 77 83  Resp: (!) 21   20  Temp:    97.9 F (36.6 C)  TempSrc:    Oral  SpO2: 100% 97% 98% 96%    GENERAL:  Alert, pleasant, no acute distress, frail HEENT:  EOMI, dry mouth CARDIOVASCULAR:  RRR, no murmurs appreciated RESPIRATORY: Poor air movement bilaterally GASTROINTESTINAL: PEG tube EXTREMITIES: Thin, atrophy, trace BL LE edema  NEURO: Left hemiparesis SKIN: Dry, no rashes noted PSYCH:  Appropriate mood and affect pleasant, some paranoia/delusions about her family   Data Reviewed:  Results are pending, will review when available.  DG Chest Portable 1 View Result Date: 10/21/2024 CLINICAL DATA:  URI sxs, AMS EXAM: PORTABLE CHEST - 1 VIEW COMPARISON:  10/09/2024 FINDINGS: Lower lung volumes. no focal airspace consolidation, pleural effusion, or pneumothorax. No cardiomegaly. Tortuous aorta with aortic atherosclerosis. No acute fracture or destructive lesions. Multilevel thoracic osteophytosis. IMPRESSION: No acute cardiopulmonary abnormality. Electronically Signed   By: Rogelia Myers M.D.   On: 10/21/2024 14:15   CT Head Wo Contrast Result Date: 10/21/2024 EXAM: CT HEAD WITHOUT CONTRAST 10/21/2024 01:50:00 PM TECHNIQUE: CT of the head was performed without the administration of intravenous contrast. Automated exposure control, iterative reconstruction, and/or weight based adjustment of the mA/kV was utilized to reduce the radiation dose to as low as reasonably achievable. COMPARISON: Head CT 07/29/2024 and MRI 06/26/2024. CLINICAL HISTORY: Mental status change, unknown cause. FINDINGS: BRAIN AND VENTRICLES: There is no evidence of an acute infarct, intracranial hemorrhage,  mass, midline shift, hydrocephalus, or extra-axial fluid collection. There  has been expected interval evolution of the large right MCA infarct with development of encephalomalacia and ex vacuo dilatation of the right lateral ventricle. There is mild cerebral atrophy. Patchy hypodensities elsewhere in the cerebral white matter bilaterally are nonspecific but compatible with moderate chronic small vessel ischemic disease. Calcified atherosclerosis at the skull base. ORBITS: Bilateral cataract extraction. SINUSES: No acute abnormality. SOFT TISSUES AND SKULL: No acute soft tissue abnormality. No skull fracture. IMPRESSION: 1. No acute intracranial abnormality. 2. Large chronic right MCA infarct. 3. Moderate chronic small vessel ischemic disease. Electronically signed by: Dasie Hamburg MD 10/21/2024 02:12 PM EST RP Workstation: [id number]    Assessment and Plan:  Acute metabolic encephalopathy/delirium - Likely exacerbated by possible previous pneumonia, exacerbated by mild dehydration and advanced age.  Want to rule out any other etiology such as subtle acute stroke.  Will order MRI brain.  ANO x 4 but definitely has paranoia and delusions about her daughter, stating she is lying to her about being in her house, will say anything to get her way, and believed she was staying in the neighbors house and was anxious that the cops would come and arrest them.  Will work to eliminate other metabolic factors that could exacerbate her delirium.  Haldol as needed.  Avoid benzodiazepines as family states they cause severe agitation.  Community-acquired pneumonia (POA) - Likely viral.  Reported that other family members/caretakers had similar symptoms.  Was given antibiotics previously, completed 5 days worth.  Chest x-ray personally reviewed noting no obvious consolidations.  COVID/flu/RSV negative.  No hypoxia, shortness of breath, purulent sputum.  Will hold off on antibiotics at this time.  Ordered respiratory pathogen  profile.  History of right MCA CVA with left hemiparesis - Occurred in August.  Continues on aspirin .  Cared for by her daughter.  Have been doing well prior to tonight's presentation.  Volume depletion - Mild hemoconcentration and dry mucous membranes suggest volume depletion.  Will order IV fluids NS at 75 mL/h.  Order BMP in AM.  COPD/emphysema - Does not appear to be in acute exacerbation.  Nebulizers as needed.  Hypertension - Likely exacerbated by hospitalization.  Hydralazine  on board for systolic blood pressure greater than 180.  Obstructive sleep apnea - Will order CPAP.  Severe protein calorie malnutrition - Patient is very frail, atrophy.  Currently has PEG tube in place but family was working on attempting to wean her off of it.  Ordered dietary consult to resume tube feeds while inpatient.  Physical debilitation muscle weakness - Noted left hemiparesis.  Will order PT/OT.  Goals of care - Had a frank discussion with the patient about her goals of care.  Patient stated she wishes to be DNR.  Daughter is in agreement.  Will place orders for DNR.  Discussed with patient's daughter independently, possible that she is reaching the limits of her caretaking ability especially given the new increased agitation.  Will reach out to [facility] to assess options for disposition.  Advance Care Planning:   Code Status: Limited: Do not attempt resuscitation (DNR) -DNR-LIMITED -Do Not Intubate/DNI    Consults: None  Family Communication: Daughter at bedside  Severity of Illness: The appropriate patient status for this patient is OBSERVATION. Observation status is judged to be reasonable and necessary in order to provide the required intensity of service to ensure the patient's safety. The patient's presenting symptoms, physical exam findings, and initial radiographic and laboratory data in the context of their medical condition is felt to place them  at decreased risk for further clinical  deterioration. Furthermore, it is anticipated that the patient will be medically stable for discharge from the hospital within 2 midnights of admission.   Author: Carliss LELON Canales, DO 10/21/2024 5:26 PM  For on call review [email or web address].

## 2024-10-21 NOTE — ED Notes (Signed)
 Patient transported to CT

## 2024-10-21 NOTE — Progress Notes (Signed)
 [facility] night cross cover note:   I was notified by the patient's RN of the patient's request for pretreatment with an anxiety lytic agent leading up to this evening's MRI, with the patient noting a history of claustrophobia.  I subsequently ordered a one-time dose of IV Ativan  to be given prior to MRI for this purpose, and also noted existing order for as needed IV Haldol for agitation to attend to any resultant paradoxical agitation from the Ativan .      Eva Pore, DO Hospitalist

## 2024-10-21 NOTE — Patient Outreach (Signed)
 Telephone outreach to [name] was successfully, but no able to  complete as is very busy with mother's appointments. MRS= 7  Shereen Gin [facility] Assistant Direct Dial: [phone]  Fax: [phone] Website: [email or web address]

## 2024-10-21 NOTE — Progress Notes (Signed)
 [facility] night cross cover note:   Route of administration on all enteric medications changed to per tube, with administration to occur via pt's PEG tube.     Eva Pore, DO Hospitalist

## 2024-10-21 NOTE — ED Triage Notes (Signed)
 Pt bib [facility] coming from home after family called EMS for concerns for paranoia. Pt normally alert and oriented x4 but has been confused for about a week. Pt has had upper respiratory infection for a week and has been taking abx for it. Pt alert and oriented x3 in triage (disoriented to situation). Pt has no complaints at this time.   EMS VS: 140/70 70 HR 18 RR 98% RA 143cbg 98.7

## 2024-10-21 NOTE — ED Provider Notes (Signed)
 [place] [facility] Provider Note   CSN: [id number] Arrival date & time: 10/21/24  1228     History  Chief Complaint  Patient presents with   Altered Mental Status    [name] is a 89 y.o. female with PMH as listed below who presents bib [facility] coming from home after family called EMS for concerns for paranoia and disorientation which is abnormal for her. History provided by patient and her daughter, whom she lives with. Pt normally alert and oriented x4 but has been confused for about a week. Pt has had upper respiratory infection for a week and has been taking augmentin  for it d/t c/f possible PNA. Pt alert and oriented x3 in triage (disoriented to situation). Pt has no complaints at this time. Denies SOB, CP, abd pain, urinary sxs. She states her cough has somewhat improved over the week. Oriented to time, place, and somewhat to situation. Couldn't remember what she had to eat yesterday and couldn't remember that she lives with her daughter. Has baseline LUE/LLE deficits and EOM deficits d/t prior stroke.   EMS VS: 140/70 70 HR 18 RR 98% RA 143cbg 98.7.    Past Medical History:  Diagnosis Date   Anxiety    Back pain    Cancer (HCC)    Depression    Emphysema    Esophageal spasm    Lung nodule    OSA (obstructive sleep apnea)    Osteomalacia    Personal history of radiation therapy    Stroke (HCC)    Vertigo    Vitamin D  deficiency        Home Medications Prior to Admission medications   Medication Sig Start Date End Date Taking? Authorizing Provider  acetaminophen  (TYLENOL ) 650 MG CR tablet Take 1 tablet (650 mg total) by mouth 3 (three) times daily. 07/31/24   Love, Sharlet RAMAN, PA-C  albuterol  (PROVENTIL  HFA;VENTOLIN  HFA) 108 (90 Base) MCG/ACT inhaler Inhale 2 puffs into the lungs every 6 (six) hours as needed for wheezing or shortness of breath.    [provider]  arformoterol  (BROVANA ) 15 MCG/2ML NEBU Take 2 mLs  (15 mcg total) by nebulization 2 (two) times daily. 08/07/24   Raulkar, Sven SQUIBB, MD  artificial tears ophthalmic solution Place 1 drop into both eyes as needed for dry eyes. 07/08/24   de Clint Kill, Cortney E, NP  aspirin  EC 81 MG tablet Take 81 mg by mouth daily.    [provider]  atorvastatin  (LIPITOR) 10 MG tablet Take 1 tablet (10 mg total) by mouth at bedtime. 07/31/24   Love, Sharlet RAMAN, PA-C  Baclofen  5 MG TABS Take 1 tablet (5 mg total) by mouth 2 (two) times daily as needed. 08/07/24   Raulkar, Sven SQUIBB, MD  benzocaine  (ORAJEL) 10 % mucosal gel Use as directed in the mouth or throat 2 (two) times daily as needed for mouth pain. 07/31/24   Love, Sharlet RAMAN, PA-C  buPROPion  (WELLBUTRIN ) 75 MG tablet Take 1 tablet (75 mg total) by mouth 2 (two) times daily. 07/31/24   Love, Sharlet RAMAN, PA-C  busPIRone  (BUSPAR ) 5 MG tablet Take 1 tablet (5 mg total) by mouth 3 (three) times daily as needed (Anxiety). 07/31/24   Love, Sharlet RAMAN, PA-C  camphor-menthol  [facility]) lotion Apply topically as needed for itching. 07/31/24   Love, Sharlet RAMAN, PA-C  cephALEXin  (KEFLEX ) 500 MG capsule Take 1 capsule (500 mg total) by mouth 2 (two) times daily. 09/08/24  Lorilee Sven SQUIBB, MD  Cholecalciferol  (VITAMIN D ) 50 MCG (2000 UT) tablet Take 1 tablet (2,000 Units total) by mouth daily. 07/31/24   Love, Sharlet RAMAN, PA-C  diclofenac  Sodium (VOLTAREN ) 1 % GEL Apply 2 g topically 4 (four) times daily. 07/31/24   Love, Sharlet RAMAN, PA-C  escitalopram  (LEXAPRO ) 20 MG tablet Take 2 tablets (40 mg total) by mouth daily. 07/31/24   Love, Sharlet RAMAN, PA-C  gabapentin  (NEURONTIN ) 300 MG capsule Take 1 capsule (300 mg total) by mouth 3 (three) times daily as needed. 08/14/24   Onita Duos, MD  l-methylfolate-B6-B12 (METANX) 3-35-2 MG TABS tablet Take 1 tablet by mouth 2 (two) times daily. 07/31/24   Love, Sharlet RAMAN, PA-C  lamoTRIgine  (LAMICTAL ) 25 MG tablet One tab bid xone week Then 2 tabs bid 08/28/24   Onita Duos, MD  lidocaine  (LIDODERM ) 5 %  Place 2 patches onto the skin daily. Remove & Discard patch within 12 hours or as directed by MD 07/31/24   Love, Sharlet RAMAN, PA-C  melatonin 5 MG TABS Take 1 tablet (5 mg total) by mouth at bedtime. 07/31/24   Love, Sharlet RAMAN, PA-C  Menthol , Topical Analgesic, 4 % GEL Apply 1 application  topically 4 (four) times daily. 07/31/24   Love, Sharlet RAMAN, PA-C  modafinil  (PROVIGIL ) 100 MG tablet Take 1 tablet (100 mg total) by mouth daily. 08/14/24   Onita Duos, MD  Mouthwashes (MOUTH RINSE) LIQD solution 15 mLs by Mouth Rinse route as needed (oral care). 07/31/24   Love, Sharlet RAMAN, PA-C  Multiple Vitamin (MULTIVITAMIN WITH MINERALS) TABS tablet Take 1 tablet by mouth daily.    [provider]  nitroGLYCERIN  (NITROSTAT ) 0.4 MG SL tablet Place 1 tablet (0.4 mg total) under the tongue every 5 (five) minutes as needed for chest pain. 07/08/24   de Clint Kill, Cortney E, NP  Nutritional Supplements (FEEDING SUPPLEMENT, OSMOLITE 1.5 CAL,) LIQD Place 237 mLs into feeding tube 2 (two) times daily. 07/31/24   Love, Sharlet RAMAN, PA-C  Nystatin (GERHARDT'S BUTT CREAM) CREA Apply 1 Application topically 3 (three) times daily. 07/08/24   de Clint Kill, Cortney E, NP  polyethylene glycol (MIRALAX  / GLYCOLAX ) 17 g packet Place 17 g into feeding tube daily as needed for mild constipation. 07/08/24   de Clint Kill, Cortney E, NP  senna (SENOKOT) 8.6 MG TABS tablet Take 1 tablet (8.6 mg total) by mouth daily. 08/01/24   Love, Sharlet RAMAN, PA-C  traMADol  (ULTRAM ) 50 MG tablet Take 0.5-1 tablets (25-50 mg total) by mouth every 4 (four) hours as needed for severe pain (pain score 7-10). 07/31/24   Love, Sharlet RAMAN, PA-C  Water  For Irrigation, Sterile (FREE WATER ) SOLN Place 120 mLs into feeding tube 3 (three) times daily. 07/31/24   Love, Sharlet RAMAN, PA-C  YUPELRI  175 MCG/3ML nebulizer solution TAKE 3 MLS (175 MCG TOTAL) BY NEBULIZATION DAILY. 10/08/24   Lorilee Sven SQUIBB, MD      Allergies    Adhesive [tape], Ativan  [lorazepam ], and Latex     Review of Systems   Review of Systems A 10 point review of systems was performed and is negative unless otherwise reported in HPI.  Physical Exam Updated Vital Signs BP (!) 133/101   Pulse 72   Temp 98.4 F (36.9 C)   Resp (!) 21   SpO2 97%  Physical Exam General: Normal appearing elderly female, lying in bed.  HEENT: PERRLA, Sclera anicteric, dry mucous membranes, trachea midline.  Cardiology: RRR, no murmurs/rubs/gallops.  Resp:  Normal respiratory rate and effort. CTAB, no wheezes, rhonchi, crackles.  Abd: Soft, non-tender, non-distended. No rebound tenderness or guarding.  GU: Deferred. MSK: No peripheral edema or signs of trauma. Extremities without deformity or TTP. No cyanosis or clubbing. Skin: warm, dry.  Back: No CVA tenderness Neuro: A&Ox3, CNs II-XII grossly intact. LUE/LLE deficits d/t prior stroke (baseline). Sensation grossly intact.   ED Results / Procedures / Treatments   Labs (all labs ordered are listed, but only abnormal results are displayed) Labs Reviewed  COMPREHENSIVE METABOLIC PANEL WITH GFR - Abnormal; Notable for the following components:      Result Value   Glucose, Bld 106 (*)    Total Protein 6.3 (*)    Albumin 3.2 (*)    All other components within normal limits  CBG MONITORING, ED - Abnormal; Notable for the following components:   Glucose-Capillary 118 (*)    All other components within normal limits  RESP PANEL BY RT-PCR (RSV, FLU A&B, COVID)  RVPGX2  CBC  URINALYSIS, ROUTINE W REFLEX MICROSCOPIC  CBG MONITORING, ED    EKG EKG Interpretation Date/Time:  Tuesday October 21 2024 12:48:51 EST Ventricular Rate:  70 PR Interval:  143 QRS Duration:  77 QT Interval:  398 QTC Calculation: 430 R Axis:   -3  Text Interpretation: Sinus rhythm Anteroseptal infarct, age indeterminate Confirmed by Franklyn Gills [phone]) on 10/21/2024 1:12:36 PM  Radiology DG Chest Portable 1 View Result Date: 10/21/2024 CLINICAL DATA:  URI sxs, AMS EXAM:  PORTABLE CHEST - 1 VIEW COMPARISON:  10/09/2024 FINDINGS: Lower lung volumes. no focal airspace consolidation, pleural effusion, or pneumothorax. No cardiomegaly. Tortuous aorta with aortic atherosclerosis. No acute fracture or destructive lesions. Multilevel thoracic osteophytosis. IMPRESSION: No acute cardiopulmonary abnormality. Electronically Signed   By: Rogelia Myers M.D.   On: 10/21/2024 14:15   CT Head Wo Contrast Result Date: 10/21/2024 EXAM: CT HEAD WITHOUT CONTRAST 10/21/2024 01:50:00 PM TECHNIQUE: CT of the head was performed without the administration of intravenous contrast. Automated exposure control, iterative reconstruction, and/or weight based adjustment of the mA/kV was utilized to reduce the radiation dose to as low as reasonably achievable. COMPARISON: Head CT 07/29/2024 and MRI 06/26/2024. CLINICAL HISTORY: Mental status change, unknown cause. FINDINGS: BRAIN AND VENTRICLES: There is no evidence of an acute infarct, intracranial hemorrhage, mass, midline shift, hydrocephalus, or extra-axial fluid collection. There has been expected interval evolution of the large right MCA infarct with development of encephalomalacia and ex vacuo dilatation of the right lateral ventricle. There is mild cerebral atrophy. Patchy hypodensities elsewhere in the cerebral white matter bilaterally are nonspecific but compatible with moderate chronic small vessel ischemic disease. Calcified atherosclerosis at the skull base. ORBITS: Bilateral cataract extraction. SINUSES: No acute abnormality. SOFT TISSUES AND SKULL: No acute soft tissue abnormality. No skull fracture. IMPRESSION: 1. No acute intracranial abnormality. 2. Large chronic right MCA infarct. 3. Moderate chronic small vessel ischemic disease. Electronically signed by: Dasie Hamburg MD 10/21/2024 02:12 PM EST RP Workstation: [id number]    Procedures Procedures    Medications Ordered in ED Medications - No data to display  ED Course/ Medical  Decision Making/ A&P                          Medical Decision Making Amount and/or Complexity of Data Reviewed Labs: ordered. Decision-making details documented in ED Course. Radiology: ordered. Decision-making details documented in ED Course.  Risk Decision regarding hospitalization.    This patient presents to the  ED for concern of disorientation, new confusion; this involves an extensive number of treatment options, and is a complaint that carries with it a high risk of complications and morbidity.  I considered the following differential and admission for this acute, potentially life threatening condition. Patient is overall well-appearing, HDS.  MDM:    Ddx of acute altered mental status or encephalopathy considered but not limited to: -Intracranial abnormalities such as ICH, hydrocephalus. No reported head trauma. Reassuringly CTH no acute abnormalities. -Infection such as UTI, PNA - CXR w/ no PNA any longer, her cough has improved. Pending UA for UTI.  -Toxic ingestion such as polypharmacy. No new medication changes. -No significant electrolyte abnormalities or hyper/hypoglycemia -No hypoxia -EKG w/ no arrhythmia, no CP or SOB to indicate ACS   Clinical Course as of 10/21/24 1519  Tue Oct 21, 2024  1311 Glucose-Capillary(!): 118 [HN]  1357 WBC: 10.0 No leukocytosis  [HN]  1439 DG Chest Portable 1 View NO PNA [HN]  1439 CT Head Wo Contrast No acute abnormalities, chronic R MCA infarct [HN]    Clinical Course User Index [HN] Franklyn Sid SAILOR, MD    Labs: I Ordered, and personally interpreted labs.  The pertinent results include:  those listed above  Imaging Studies ordered: I ordered imaging studies including CTH, CXR I independently visualized and interpreted imaging. I agree with the radiologist interpretation  Additional history obtained from chart review, daughter at bedside.    Cardiac Monitoring: The patient was maintained on a cardiac monitor.  I  personally viewed and interpreted the cardiac monitored which showed an underlying rhythm of: NSR  Reevaluation: After the interventions noted above, I reevaluated the patient and found that they have :stayed the same  Social Determinants of Health: Lives with daughter  Disposition:  Patient is signed out to the oncoming ED physician Dr. Emil who is made aware of her history, presentation, exam, workup, and plan. Plan is to obtain UA, if negative may still consider admission for acute change in mental status or MRI   Co morbidities that complicate the patient evaluation  Past Medical History:  Diagnosis Date   Anxiety    Back pain    Cancer (HCC)    Depression    Emphysema    Esophageal spasm    Lung nodule    OSA (obstructive sleep apnea)    Osteomalacia    Personal history of radiation therapy    Stroke (HCC)    Vertigo    Vitamin D  deficiency      Medicines No orders of the defined types were placed in this encounter.   I have reviewed the patients home medicines and have made adjustments as needed  Problem List / ED Course: Problem List Items Addressed This Visit   None Visit Diagnoses       Disorientation    -  Primary                   This note was created using dictation software, which may contain spelling or grammatical errors.    Franklyn Sid SAILOR, MD 10/21/24 [phone]

## 2024-10-22 ENCOUNTER — Observation Stay (HOSPITAL_COMMUNITY)

## 2024-10-22 DIAGNOSIS — Z91040 Latex allergy status: Secondary | ICD-10-CM | POA: Diagnosis not present

## 2024-10-22 DIAGNOSIS — Z833 Family history of diabetes mellitus: Secondary | ICD-10-CM | POA: Diagnosis not present

## 2024-10-22 DIAGNOSIS — E440 Moderate protein-calorie malnutrition: Secondary | ICD-10-CM | POA: Insufficient documentation

## 2024-10-22 DIAGNOSIS — E876 Hypokalemia: Secondary | ICD-10-CM | POA: Diagnosis present

## 2024-10-22 DIAGNOSIS — J129 Viral pneumonia, unspecified: Secondary | ICD-10-CM | POA: Diagnosis present

## 2024-10-22 DIAGNOSIS — Z8249 Family history of ischemic heart disease and other diseases of the circulatory system: Secondary | ICD-10-CM | POA: Diagnosis not present

## 2024-10-22 DIAGNOSIS — Z79899 Other long term (current) drug therapy: Secondary | ICD-10-CM | POA: Diagnosis not present

## 2024-10-22 DIAGNOSIS — I69354 Hemiplegia and hemiparesis following cerebral infarction affecting left non-dominant side: Secondary | ICD-10-CM | POA: Diagnosis not present

## 2024-10-22 DIAGNOSIS — R4182 Altered mental status, unspecified: Secondary | ICD-10-CM

## 2024-10-22 DIAGNOSIS — Z923 Personal history of irradiation: Secondary | ICD-10-CM | POA: Diagnosis not present

## 2024-10-22 DIAGNOSIS — R569 Unspecified convulsions: Secondary | ICD-10-CM | POA: Diagnosis not present

## 2024-10-22 DIAGNOSIS — Z859 Personal history of malignant neoplasm, unspecified: Secondary | ICD-10-CM | POA: Diagnosis not present

## 2024-10-22 DIAGNOSIS — J449 Chronic obstructive pulmonary disease, unspecified: Secondary | ICD-10-CM | POA: Diagnosis not present

## 2024-10-22 DIAGNOSIS — Z87891 Personal history of nicotine dependence: Secondary | ICD-10-CM | POA: Diagnosis not present

## 2024-10-22 DIAGNOSIS — E43 Unspecified severe protein-calorie malnutrition: Secondary | ICD-10-CM | POA: Diagnosis present

## 2024-10-22 DIAGNOSIS — Z8673 Personal history of transient ischemic attack (TIA), and cerebral infarction without residual deficits: Secondary | ICD-10-CM | POA: Diagnosis not present

## 2024-10-22 DIAGNOSIS — Z931 Gastrostomy status: Secondary | ICD-10-CM | POA: Diagnosis not present

## 2024-10-22 DIAGNOSIS — G928 Other toxic encephalopathy: Secondary | ICD-10-CM | POA: Diagnosis present

## 2024-10-22 DIAGNOSIS — Z7982 Long term (current) use of aspirin: Secondary | ICD-10-CM | POA: Diagnosis not present

## 2024-10-22 DIAGNOSIS — Z66 Do not resuscitate: Secondary | ICD-10-CM | POA: Diagnosis present

## 2024-10-22 DIAGNOSIS — Z823 Family history of stroke: Secondary | ICD-10-CM | POA: Diagnosis not present

## 2024-10-22 DIAGNOSIS — Z803 Family history of malignant neoplasm of breast: Secondary | ICD-10-CM | POA: Diagnosis not present

## 2024-10-22 DIAGNOSIS — Z1152 Encounter for screening for COVID-19: Secondary | ICD-10-CM | POA: Diagnosis not present

## 2024-10-22 DIAGNOSIS — E860 Dehydration: Secondary | ICD-10-CM | POA: Diagnosis present

## 2024-10-22 DIAGNOSIS — F40240 Claustrophobia: Secondary | ICD-10-CM | POA: Diagnosis present

## 2024-10-22 DIAGNOSIS — G4733 Obstructive sleep apnea (adult) (pediatric): Secondary | ICD-10-CM | POA: Diagnosis present

## 2024-10-22 DIAGNOSIS — G9341 Metabolic encephalopathy: Secondary | ICD-10-CM | POA: Diagnosis not present

## 2024-10-22 DIAGNOSIS — I10 Essential (primary) hypertension: Secondary | ICD-10-CM | POA: Diagnosis present

## 2024-10-22 DIAGNOSIS — J439 Emphysema, unspecified: Secondary | ICD-10-CM | POA: Diagnosis present

## 2024-10-22 DIAGNOSIS — G8192 Hemiplegia, unspecified affecting left dominant side: Secondary | ICD-10-CM | POA: Diagnosis not present

## 2024-10-22 DIAGNOSIS — Z888 Allergy status to other drugs, medicaments and biological substances status: Secondary | ICD-10-CM | POA: Diagnosis not present

## 2024-10-22 DIAGNOSIS — J440 Chronic obstructive pulmonary disease with acute lower respiratory infection: Secondary | ICD-10-CM | POA: Diagnosis present

## 2024-10-22 LAB — RESPIRATORY PANEL BY PCR

## 2024-10-22 LAB — BASIC METABOLIC PANEL WITH GFR
Anion gap: 11 (ref 5–15)
BUN: 10 mg/dL (ref 8–23)
CO2: 25 mmol/L (ref 22–32)
Calcium: 8.9 mg/dL (ref 8.9–10.3)
Chloride: 102 mmol/L (ref 98–111)
Creatinine, Ser: 0.57 mg/dL (ref 0.44–1.00)
GFR, Estimated: >60 mL/min (ref >60)
Glucose, Bld: 95 mg/dL (ref 70–99)
Potassium: 3.3 mmol/L — ABNORMAL LOW (ref 3.5–5.1)
Sodium: 138 mmol/L (ref 135–145)

## 2024-10-22 LAB — CBC
HCT: 36.8 % (ref 36.0–46.0)
Hemoglobin: 12.5 g/dL (ref 12.0–15.0)
MCH: 31.8 pg (ref 26.0–34.0)
MCHC: 34 g/dL (ref 30.0–36.0)
MCV: 93.6 fL (ref 80.0–100.0)
Platelets: 154 K/uL (ref 150–400)
RBC: 3.93 MIL/uL (ref 3.87–5.11)
RDW: 13.3 % (ref 11.5–15.5)
WBC: 8.5 K/uL (ref 4.0–10.5)
nRBC: 0 % (ref 0.0–0.2)

## 2024-10-22 LAB — MAGNESIUM: Magnesium: 1.8 mg/dL (ref 1.7–2.4)

## 2024-10-22 MED ORDER — FREE WATER
120.0000 mL | Freq: Three times a day (TID) | Status: DC
Start: 2024-10-22 — End: 2024-10-24
  Administered 2024-10-22 – 2024-10-24 (×6): 120 mL

## 2024-10-22 MED ORDER — MELATONIN 5 MG PO TABS
5.0000 mg | ORAL_TABLET | Freq: Once | ORAL | Status: AC
  Administered 2024-10-22: 5 mg

## 2024-10-22 MED ORDER — ATORVASTATIN CALCIUM 10 MG PO TABS
10.0000 mg | ORAL_TABLET | Freq: Once | ORAL | Status: AC
  Administered 2024-10-22: 10 mg

## 2024-10-22 MED ORDER — PROSOURCE TF20 ENFIT COMPATIBL EN LIQD
60.0000 mL | Freq: Every day | ENTERAL | Status: DC
  Administered 2024-10-23 – 2024-10-24 (×2): 60 mL
  Filled 2024-10-22 (×2): qty 60

## 2024-10-22 MED ORDER — QUETIAPINE FUMARATE 25 MG PO TABS
25.0000 mg | ORAL_TABLET | Freq: Once | ORAL | Status: AC
  Administered 2024-10-22: 25 mg

## 2024-10-22 MED ORDER — ASPIRIN 81 MG PO CHEW
81.0000 mg | CHEWABLE_TABLET | Freq: Every day | ORAL | Status: DC
  Administered 2024-10-22 – 2024-10-24 (×3): 81 mg
  Filled 2024-10-22 (×3): qty 1

## 2024-10-22 MED ORDER — POTASSIUM CHLORIDE 20 MEQ PO PACK
40.0000 meq | PACK | Freq: Once | ORAL | Status: AC
  Administered 2024-10-22: 40 meq
  Filled 2024-10-22: qty 2

## 2024-10-22 MED ORDER — QUETIAPINE FUMARATE 25 MG PO TABS
25.0000 mg | ORAL_TABLET | Freq: Every day | ORAL | Status: DC
  Filled 2024-10-22: qty 1

## 2024-10-22 MED ORDER — OSMOLITE 1.5 CAL PO LIQD
237.0000 mL | Freq: Three times a day (TID) | ORAL | Status: DC
  Administered 2024-10-22 – 2024-10-24 (×6): 237 mL
  Filled 2024-10-22 (×8): qty 237

## 2024-10-22 NOTE — Evaluation (Signed)
 Physical Therapy Evaluation Patient Details Name: [name] MRN: [id number] DOB: 08-04-1932 Today's Date: 10/22/2024  History of Present Illness  Patient is a 89 y/o female admitted 10/21/24 with dry cough, agitation, delirium and paranoia. PMH positive for CVA with L hemiparesis, OSA. Emphysema, depression/anxiety, PAD, breast CA.  Clinical Impression  Patient presents with decreased mobility at her recent baseline needing lift for OOB and still active with HHPT since stroke earlier this year.  She was able to sit EOB today with +2 A and at times lightened assistance when pt able to engage and lean forward with cues and support.  She is <1 year post stroke so feel can continue to improve with skilled PT in the acute setting and with resumption of HHPT at d/c.          If plan is discharge home, recommend the following: A lot of help with bathing/dressing/bathroom;Two people to help with walking and/or transfers;Assist for transportation   Can travel by private vehicle        Equipment Recommendations None recommended by PT  Recommendations for Other Services       Functional Status Assessment Patient has had a recent decline in their functional status and/or demonstrates limited ability to make significant improvements in function in a reasonable and predictable amount of time     Precautions / Restrictions Precautions Precautions: Fall Recall of Precautions/Restrictions: Impaired Precaution/Restrictions Comments: PEG, L hemiparesis      Mobility  Bed Mobility Overal bed mobility: Needs Assistance Bed Mobility: Supine to Sit, Sit to Supine     Supine to sit: HOB elevated, Max assist, +2 for physical assistance Sit to supine: Max assist, +2 for physical assistance, Total assist   General bed mobility comments: cues to reach for rail on her R and assist for L LE off EOB and to lift trunk, scoot hips, to supine assist for legs and trunk and to scoot to [facility]     Transfers Overall transfer level: Needs assistance   Transfers: Bed to chair/wheelchair/BSC            Lateral/Scoot Transfers: +2 physical assistance, Total assist General transfer comment: lateral scooting to HOB while seated on EOB with +2 using bed pad under pt with assist for keeping upright posture, pt leaning posteriorly    Ambulation/Gait                  Stairs            Wheelchair Mobility     Tilt Bed    Modified Rankin (Stroke Patients Only)       Balance Overall balance assessment: Needs assistance Sitting-balance support: Feet supported Sitting balance-Leahy Scale: Zero Sitting balance - Comments: up to max A at times for sitting balance, can engage with head and neck flexion and cues for looking down at her feet or reaching past her knee to improve upright posture and relaxing assist to about light mod A level                                     Pertinent Vitals/Pain Pain Assessment Pain Assessment: Faces Faces Pain Scale: Hurts even more Pain Location: L UE/LE with PROM Pain Descriptors / Indicators: Discomfort, Grimacing, Guarding Pain Intervention(s): Monitored during session, Limited activity within patient's tolerance    Home Living Family/patient expects to be discharged to:: Private residence Living Arrangements: Children Available Help at Discharge:  Family;Home health;Available 24 hours/day Type of Home: House Home Access: Level entry       Home Layout: One level Home Equipment: Agricultural [employer]ant (2 wheels);Rollator (4 wheels);Cane - single point;Shower seat - built in;Grab bars - tub/shower;Hand held shower head;Grab bars - toilet;Transport chair;Hospital bed;Other (comment) (hoyer lift)      Prior Function Prior Level of Function : Needs assist             Mobility Comments: in bed unless lifted to recliner, HHPT helps with pivot transfer to wheelchair at times ADLs Comments: pt can self feed and  perform grooming tasks, at times able to help with UB bathing     Extremity/Trunk Assessment   Upper Extremity Assessment Upper Extremity Assessment: Defer to OT evaluation    Lower Extremity Assessment Lower Extremity Assessment: LLE deficits/detail LLE Deficits / Details: PROM/AAROM limited with pain esp with lifting leg with knee extended; able to flex approximately 50 degrees knee flexion and ankle PROM about to neutral; reports intact sensation    Cervical / Trunk Assessment Cervical / Trunk Assessment: Kyphotic  Communication   Communication Communication: No apparent difficulties    Cognition Arousal: Alert Behavior During Therapy: Anxious   PT - Cognitive impairments: History of cognitive impairments                       PT - Cognition Comments: oriented to season, month, year and location, though initially reporting her daughter dropped her off here and she was worried the police were going to come since it was not her house Following commands: Impaired Following commands impaired: Only follows one step commands consistently, Follows one step commands with increased time     Cueing Cueing Techniques: Verbal cues, Tactile cues, Visual cues     General Comments General comments (skin integrity, edema, etc.): RN finished gravity feeding to PEG so HOB elevated throughout session.  Patient wearing brief; daughter present initially to give history, not present upon return (had been getting nursing care then EEG)    Exercises     Assessment/Plan    PT Assessment Patient needs continued PT services  PT Problem List Decreased strength;Decreased range of motion;Decreased activity tolerance;Decreased balance;Decreased mobility;Decreased knowledge of use of DME;Pain;Decreased safety awareness;Decreased cognition       PT Treatment Interventions DME instruction;Patient/family education;Functional mobility training;Wheelchair mobility training;Therapeutic  activities;Therapeutic exercise;Balance training    PT Goals (Current goals can be found in the Care Plan section)  Acute Rehab PT Goals Patient Stated Goal: return home PT Goal Formulation: With patient/family Time For Goal Achievement: 11/05/24 Potential to Achieve Goals: Fair    Frequency Min 2X/week     Co-evaluation PT/OT/SLP Co-Evaluation/Treatment: Yes Reason for Co-Treatment: Necessary to address cognition/behavior during functional activity;For patient/therapist safety PT goals addressed during session: Mobility/safety with mobility;Balance         AM-PAC PT 6 Clicks Mobility  Outcome Measure Help needed turning from your back to your side while in a flat bed without using bedrails?: Total Help needed moving from lying on your back to sitting on the side of a flat bed without using bedrails?: Total Help needed moving to and from a bed to a chair (including a wheelchair)?: Total Help needed standing up from a chair using your arms (e.g., wheelchair or bedside chair)?: Total Help needed to walk in hospital room?: Total Help needed climbing 3-5 steps with a railing? : Total 6 Click Score: 6    End of Session Equipment Utilized During  Treatment: Gait belt Activity Tolerance: Patient limited by fatigue Patient left: in bed;with call bell/phone within reach;with bed alarm set Nurse Communication: Mobility status PT Visit Diagnosis: Other abnormalities of gait and mobility (R26.89);Muscle weakness (generalized) (M62.81);Other symptoms and signs involving the nervous system (R29.898)    Time: 8660-8595 PT Time Calculation (min) (ACUTE ONLY): 25 min   Charges:   PT Evaluation $PT Eval Moderate Complexity: 1 Mod   PT General Charges $$ ACUTE PT VISIT: 1 Visit         [name], PT Acute Rehabilitation Services Office:[phone] 10/22/2024   [name] 10/22/2024, 5:39 PM

## 2024-10-22 NOTE — Plan of Care (Signed)

## 2024-10-22 NOTE — Progress Notes (Signed)
 [facility] PROGRESS NOTE   ZHURI KRASS FMW:[id number] DOB: November 14, 1932 DOA: 10/21/2024  PCP: [name], MD  Brief History: 89 y.o. female with history of CVA with residual left hemiparesis, OSA, emphysema, depression/anxiety, presented with cough and altered mental status.  History was corroborated by daughter.  Patient was hospitalized for further management of altered mental status.   Consultants: None  Procedures: EEG is pending    Subjective/Interval History: Patient noted to be mildly distracted.  She thinks that she is at her mother's house.  She knew the year or month.  Denies any headaches.  No chest pain shortness of breath.    Assessment/Plan:  Acute metabolic encephalopathy Etiology unclear.  Could be brought on by recent pneumonia.  MRI brain does not show any acute findings.  Patient continues to think that she is at her mother's place. Will do cognitive impairment workup.  Will also check EEG. Consider low-dose Seroquel.  QTc is noted to be normal on EKG. UA unremarkable.  Community-acquired pneumonia Likely viral.  She has completed 5 days of antibiotics.  Chest x-ray did not show any acute findings.  COVID-19 influenza and RSV negative.  No respiratory symptoms noted.  Not noted to be on antibiotics currently.  WBC is noted to be normal.  She is afebrile.   Respiratory viral panel is negative as well.  Hypokalemia Will be supplemented.  Magnesium  is 1.8.  History of right MCA CVA with left hemiparesis This occurred in August.  Noted to be on aspirin .  MRI brain was repeated and does not show any new findings.  Dehydration Given IV fluids.  History of COPD/emphysema No exacerbation noted.  Essential hypertension Monitor blood pressures closely.  Obstructive sleep apnea CPAP  Severe protein calorie malnutrition Has a PEG tube but patient's family has been working on weaning her off of it.  Dietitian consulted.  Physical  deconditioning PT and OT evaluation.  DVT Prophylaxis: Subcutaneous heparin  Code Status: DNR Family Communication: No family at bedside.  Will update daughter later today Disposition Plan: To be determined  Status is: Observation The patient will require care spanning > 2 midnights and should be moved to inpatient because: Persistent altered mental status      Medications: Scheduled:  atorvastatin   10 mg Per Tube QHS   buPROPion   75 mg Per Tube BID   escitalopram   40 mg Per Tube Daily   free water   120 mL Per Tube TID   heparin   5,000 Units Subcutaneous Q8H   melatonin  5 mg Per Tube QHS   modafinil   100 mg Per Tube Daily   potassium chloride   40 mEq Per Tube Once   Continuous:  sodium chloride  75 mL/hr at 10/21/24 1859   PRN:albuterol , artificial tears, busPIRone , haloperidol lactate, hydrALAZINE , nitroGLYCERIN , ondansetron  **OR** ondansetron  (ZOFRAN ) IV, polyethylene glycol, traZODone    Objective:  Vital Signs  Vitals:   10/21/24 1958 10/22/24 0010 10/22/24 0345 10/22/24 0738  BP: (!) 164/68 131/74 (!) 144/96 (!) 152/85  Pulse: 82 78 78 77  Resp: 18 17 19    Temp: 98.1 F (36.7 C) 98.2 F (36.8 C) 98.2 F (36.8 C) 97.9 F (36.6 C)  TempSrc: Oral Oral Oral Oral  SpO2: 94% 93% 95% 95%    Intake/Output Summary (Last 24 hours) at 10/22/2024 0923 Last data filed at 10/22/2024 9375 Gross per 24 hour  Intake 1020 ml  Output --  Net 1020 ml    General appearance: Awake alert.  In no distress.  Distracted Resp: Clear to auscultation bilaterally.  Normal effort Cardio: S1-S2 is normal regular.  No S3-S4.  No rubs murmurs or bruit GI: Abdomen is soft.  Nontender nondistended.  Bowel sounds are present normal.  No masses organomegaly.  PEG tube noted Extremities: No edema.  Full range of motion of lower extremities. Neurologic: She is awake alert.  Oriented to year or month but not to place.  Left-sided hemiparesis from recent stroke.    Lab Results:  Data  Reviewed: I have personally reviewed following labs and reports of the imaging studies  CBC: Recent Labs  Lab 10/21/24 1249 10/22/24 0327  WBC 10.0 8.5  HGB 12.7 12.5  HCT 38.4 36.8  MCV 95.5 93.6  PLT 176 154    Basic Metabolic Panel: Recent Labs  Lab 10/21/24 1249 10/22/24 0327  NA 140 138  K 3.7 3.3*  CL 101 102  CO2 29 25  GLUCOSE 106* 95  BUN 15 10  CREATININE 0.57 0.57  CALCIUM  9.1 8.9  MG  --  1.8    GFR: CrCl cannot be calculated (Unknown ideal weight.).  Liver Function Tests: Recent Labs  Lab 10/21/24 1249  AST 19  ALT 16  ALKPHOS 87  BILITOT 0.3  PROT 6.3*  ALBUMIN 3.2*    CBG: Recent Labs  Lab 10/21/24 1246  GLUCAP 118*     Recent Results (from the past 240 hours)  Resp panel by RT-PCR (RSV, Flu A&B, Covid) Anterior Nasal Swab     Status: None   Collection Time: 10/21/24  1:12 PM   Specimen: Anterior Nasal Swab  Result Value Ref Range Status   SARS Coronavirus 2 by RT PCR NEGATIVE NEGATIVE Final   Influenza A by PCR NEGATIVE NEGATIVE Final   Influenza B by PCR NEGATIVE NEGATIVE Final    Comment: (NOTE) The Xpert Xpress SARS-CoV-2/FLU/RSV plus assay is intended as an aid in the diagnosis of influenza from Nasopharyngeal swab specimens and should not be used as a sole basis for treatment. Nasal washings and aspirates are unacceptable for Xpert Xpress SARS-CoV-2/FLU/RSV testing.  Fact Sheet for Patients: bloggercourse.com  Fact Sheet for Healthcare Providers: seriousbroker.it  This test is not yet approved or cleared by the [place]  FDA and has been authorized for detection and/or diagnosis of SARS-CoV-2 by FDA under an Emergency Use Authorization (EUA). This EUA will remain in effect (meaning this test can be used) for the duration of the COVID-19 declaration under Section 564(b)(1) of the Act, 21 U.S.C. section 360bbb-3(b)(1), unless the authorization is terminated  or revoked.     Resp Syncytial Virus by PCR NEGATIVE NEGATIVE Final    Comment: (NOTE) Fact Sheet for Patients: bloggercourse.com  Fact Sheet for Healthcare Providers: seriousbroker.it  This test is not yet approved or cleared by the [place]  FDA and has been authorized for detection and/or diagnosis of SARS-CoV-2 by FDA under an Emergency Use Authorization (EUA). This EUA will remain in effect (meaning this test can be used) for the duration of the COVID-19 declaration under Section 564(b)(1) of the Act, 21 U.S.C. section 360bbb-3(b)(1), unless the authorization is terminated or revoked.  Performed at [facility], [address], [place], [place] [address]   Respiratory (~20 pathogens) panel by PCR     Status: None   Collection Time: 10/22/24  3:37 AM   Specimen: Nasopharyngeal Swab; Respiratory  Result Value Ref Range Status   Adenovirus NOT DETECTED NOT DETECTED Final   Coronavirus 229E NOT DETECTED NOT DETECTED Final  Comment: (NOTE) The Coronavirus on the Respiratory Panel, DOES NOT test for the novel  Coronavirus (2019 nCoV)    Coronavirus HKU1 NOT DETECTED NOT DETECTED Final   Coronavirus NL63 NOT DETECTED NOT DETECTED Final   Coronavirus OC43 NOT DETECTED NOT DETECTED Final   Metapneumovirus NOT DETECTED NOT DETECTED Final   Rhinovirus / Enterovirus NOT DETECTED NOT DETECTED Final   Influenza A NOT DETECTED NOT DETECTED Final   Influenza B NOT DETECTED NOT DETECTED Final   Parainfluenza Virus 1 NOT DETECTED NOT DETECTED Final   Parainfluenza Virus 2 NOT DETECTED NOT DETECTED Final   Parainfluenza Virus 3 NOT DETECTED NOT DETECTED Final   Parainfluenza Virus 4 NOT DETECTED NOT DETECTED Final   Respiratory Syncytial Virus NOT DETECTED NOT DETECTED Final   Bordetella pertussis NOT DETECTED NOT DETECTED Final   Bordetella Parapertussis NOT DETECTED NOT DETECTED Final   Chlamydophila pneumoniae NOT  DETECTED NOT DETECTED Final   Mycoplasma pneumoniae NOT DETECTED NOT DETECTED Final    Comment: Performed at [facility], [address], [place], [place] [address]      Radiology Studies: MR BRAIN WO CONTRAST Result Date: 10/21/2024 EXAM: MRI BRAIN WITHOUT CONTRAST 10/21/2024 09:50:32 PM TECHNIQUE: Multiplanar multisequence MRI of the head/brain was performed without the administration of intravenous contrast. COMPARISON: CT head earlier today. MRI head 06/26/2024 CLINICAL HISTORY: Mental status change, unknown cause. FINDINGS: BRAIN AND VENTRICLES: No acute infarct. Sequela of prior right MCA territory infarct including encephalomalacia and extensive hemosiderin deposition from prior hemorrhage No acute intracranial hemorrhage. No mass. No midline shift. No hydrocephalus. Patchy white matter T2 hyperintensities, compatible with chronic microvascular ischemic change. Cerebral atrophy. ORBITS: No acute abnormality. SINUSES AND MASTOIDS: No acute abnormality. BONES AND SOFT TISSUES: Normal marrow signal. No acute soft tissue abnormality. IMPRESSION: 1. No acute intracranial abnormality. 2. Prior right MCA territory infarct. Electronically signed by: Gilmore Molt MD 10/21/2024 10:20 PM EST RP Workstation: [id number]   DG Chest Portable 1 View Result Date: 10/21/2024 CLINICAL DATA:  URI sxs, AMS EXAM: PORTABLE CHEST - 1 VIEW COMPARISON:  10/09/2024 FINDINGS: Lower lung volumes. no focal airspace consolidation, pleural effusion, or pneumothorax. No cardiomegaly. Tortuous aorta with aortic atherosclerosis. No acute fracture or destructive lesions. Multilevel thoracic osteophytosis. IMPRESSION: No acute cardiopulmonary abnormality. Electronically Signed   By: Rogelia Myers M.D.   On: 10/21/2024 14:15   CT Head Wo Contrast Result Date: 10/21/2024 EXAM: CT HEAD WITHOUT CONTRAST 10/21/2024 01:50:00 PM TECHNIQUE: CT of the head was performed without the administration of intravenous contrast.  Automated exposure control, iterative reconstruction, and/or weight based adjustment of the mA/kV was utilized to reduce the radiation dose to as low as reasonably achievable. COMPARISON: Head CT 07/29/2024 and MRI 06/26/2024. CLINICAL HISTORY: Mental status change, unknown cause. FINDINGS: BRAIN AND VENTRICLES: There is no evidence of an acute infarct, intracranial hemorrhage, mass, midline shift, hydrocephalus, or extra-axial fluid collection. There has been expected interval evolution of the large right MCA infarct with development of encephalomalacia and ex vacuo dilatation of the right lateral ventricle. There is mild cerebral atrophy. Patchy hypodensities elsewhere in the cerebral white matter bilaterally are nonspecific but compatible with moderate chronic small vessel ischemic disease. Calcified atherosclerosis at the skull base. ORBITS: Bilateral cataract extraction. SINUSES: No acute abnormality. SOFT TISSUES AND SKULL: No acute soft tissue abnormality. No skull fracture. IMPRESSION: 1. No acute intracranial abnormality. 2. Large chronic right MCA infarct. 3. Moderate chronic small vessel ischemic disease. Electronically signed by: Dasie Hamburg MD 10/21/2024 02:12 PM  EST RP Workstation: [id number]       LOS: 0 days   Kaimana Lurz [employer] Pager on [email or web address]  10/22/2024, 9:23 AM

## 2024-10-22 NOTE — Evaluation (Signed)
 Occupational Therapy Evaluation Patient Details Name: [name] MRN: [id number] DOB: [birth year] Today's Date: 10/22/2024   History of Present Illness   Patient is a 89 y/o female admitted 10/21/24 with dry cough, agitation, delirium and paranoia. PMH positive for CVA with L hemiparesis, OSA. Emphysema, depression/anxiety, PAD, breast CA.     Clinical Impressions At baseline, pt able to self-feed and perform grooming tasks with Mod I-Set up and participate in UB bathing/dressing. At baseline, pt requires Total assist for LB ADLs and IADLs. Pt now presents with decreased activity tolerance, decreased Right UE strength, pain in L UE with PROM, decreased balance, impaired cognition, and decreased safety and independence with functional tasks. Pt currently demonstrating ability to complete functional tasks with Contact guard-Min assist to Total assist +2 from bed level, Max assist +2 for bed mobility, and Total assist +2 for lateral scoot at EOB. Pt participated well in session but was limited by fatigue. Pt is <1 year post stroke so feel pt will benefit from acute OT services to address deficits and increase safety and independence with functional tasks. Post acute discharge, pt will benefit from [facility] OT paired with continued assistance of family.     If plan is discharge home, recommend the following:   Two people to help with walking and/or transfers;Two people to help with bathing/dressing/bathroom;Assistance with cooking/housework;Assistance with feeding;Direct supervision/assist for medications management;Direct supervision/assist for financial management;Assist for transportation;Help with stairs or ramp for entrance     Functional Status Assessment   Patient has had a recent decline in their functional status and demonstrates the ability to make significant improvements in function in a reasonable and predictable amount of time.     Equipment Recommendations   None recommended by  OT (Pt already has needed equipment)     Recommendations for Other Services         Precautions/Restrictions   Precautions Precautions: Fall Recall of Precautions/Restrictions: Impaired Precaution/Restrictions Comments: PEG, L hemiparesis Restrictions Weight Bearing Restrictions Per Provider Order: No     Mobility Bed Mobility Overal bed mobility: Needs Assistance Bed Mobility: Supine to Sit, Sit to Supine     Supine to sit: HOB elevated, Max assist, +2 for physical assistance Sit to supine: Max assist, +2 for physical assistance, Total assist   General bed mobility comments: cues to reach for rail on her R and assist for L LE off EOB and to lift trunk, scoot hips, to supine assist for legs and trunk and to scoot to [facility]    Transfers Overall transfer level: Needs assistance   Transfers: Bed to chair/wheelchair/BSC            Lateral/Scoot Transfers: +2 physical assistance, Total assist General transfer comment: lateral scooting to HOB while seated on EOB with +2 using bed pad under pt with assist for keeping upright posture, pt leaning posteriorly      Balance Overall balance assessment: Needs assistance Sitting-balance support: Feet supported Sitting balance-Leahy Scale: Zero Sitting balance - Comments: up to max A at times for sitting balance, can engage with head and neck flexion and cues for looking down at her feet or reaching past her knee to improve upright posture and relaxing assist to about light mod A level                                   ADL either performed or assessed with clinical judgement   ADL Overall ADL's :  Needs assistance/impaired   Eating/Feeding Details (indicate cue type and reason): Pt with PEG tube; able to eat a Dys 1 diet with nectar thick liquid, but pt declining food and drink this session Grooming: Contact guard assist;Minimal assistance;Bed level;Cueing for sequencing   Upper Body Bathing: Maximal  assistance;Bed level;Cueing for sequencing   Lower Body Bathing: Total assistance;+2 for physical assistance;+2 for safety/equipment;Bed level   Upper Body Dressing : Maximal assistance;Bed level;Cueing for sequencing   Lower Body Dressing: Total assistance;+2 for physical assistance;+2 for safety/equipment;Bed level     Toilet Transfer Details (indicate cue type and reason): unable at this time Toileting- Architect and Hygiene: Total assistance;+2 for physical assistance;+2 for safety/equipment;Bed level         General ADL Comments: Pt with significantly decreased activity tolerance, fatiguing quickly during tasks     Vision Baseline Vision/History: 1 Wears glasses Patient Visual Report: No change from baseline Additional Comments: Vision largely [facility] for tasks assessed with glasses on; not formally screened or evaluated     Perception         Praxis         Pertinent Vitals/Pain Pain Assessment Pain Assessment: Faces Faces Pain Scale: Hurts even more Pain Location: L UE/LE with PROM Pain Descriptors / Indicators: Discomfort, Grimacing, Guarding Pain Intervention(s): Limited activity within patient's tolerance, Monitored during session, Repositioned     Extremity/Trunk Assessment Upper Extremity Assessment Upper Extremity Assessment: Right hand dominant;RUE deficits/detail;LUE deficits/detail;Generalized weakness RUE Deficits / Details: generalized weakness; mildly decreased fine motor coordination RUE Coordination: decreased fine motor (mild) LUE Deficits / Details: hemiparesis at baseline due to prior CVA; no AROM; PROM WFL but with pt reporting pain with all PROM LUE: Shoulder pain with ROM (Pain in all joints with PROM) LUE Sensation: decreased light touch;decreased proprioception LUE Coordination: decreased fine motor;decreased gross motor (hemiparesis at baseline)   Lower Extremity Assessment Lower Extremity Assessment: Defer to PT evaluation LLE  Deficits / Details: PROM/AAROM limited with pain esp with lifting leg with knee extended; able to flex approximately 50 degrees knee flexion and ankle PROM about to neutral; reports intact sensation   Cervical / Trunk Assessment Cervical / Trunk Assessment: Kyphotic   Communication Communication Communication: No apparent difficulties   Cognition Arousal: Alert Behavior During Therapy: Anxious Cognition: History of cognitive impairments, Cognition impaired   Orientation impairments: Place, Situation Awareness: Intellectual awareness intact, Online awareness impaired Memory impairment (select all impairments): Short-term memory, Working civil service fast streamer, Engineer, structural memory Attention impairment (select first level of impairment): Selective attention, Sustained attention Executive functioning impairment (select all impairments): Initiation, Organization, Sequencing, Reasoning, Problem solving OT - Cognition Comments: Pt reported concern that police may come for her due to her being in someone else's home. Pt reoriented to being in the hospital with pt reporting she did not know this.                 Following commands: Impaired Following commands impaired: Only follows one step commands consistently, Follows one step commands with increased time     Cueing  General Comments   Cueing Techniques: Verbal cues;Tactile cues;Visual cues  RN finished gravity feeding to PEG so HOB elevated throughout session. Patient wearing brief; daughter present initially to give history, not present upon return (had been getting nursing care then EEG)   Exercises     Shoulder Instructions      Home Living Family/patient expects to be discharged to:: Private residence Living Arrangements: Children Available Help at Discharge: Family;Home health;Available 24 hours/day Type  of Home: House Home Access: Level entry     Home Layout: One level     Bathroom Shower/Tub: Walk-in shower;Door          Home Equipment: Agricultural Consultant (2 wheels);Rollator (4 wheels);Cane - single point;Shower seat - built in;Grab bars - tub/shower;Hand held shower head;Grab bars - toilet;Transport chair;Hospital bed;Other (comment) (hoyer lift)          Prior Functioning/Environment Prior Level of Function : Needs assist             Mobility Comments: in bed unless lifted to recliner; currently receiving  HH PT; HH PT helps with pivot transfer to wheelchair at times ADLs Comments: pt can self feed and perform grooming tasks with Mod I to Set up; at times pt is able to assist with UB bathing/dressing; Total assist for LB bathing/dressing and toileting; wears adult briefs    OT Problem List: Decreased strength;Decreased activity tolerance;Impaired balance (sitting and/or standing);Decreased coordination;Impaired UE functional use   OT Treatment/Interventions: Self-care/ADL training;Therapeutic exercise;Energy conservation;DME and/or AE instruction;Therapeutic activities;Cognitive remediation/compensation;Patient/family education;Balance training      OT Goals(Current goals can be found in the care plan section)   Acute Rehab OT Goals Patient Stated Goal: to return home OT Goal Formulation: With patient Time For Goal Achievement: 11/05/24 Potential to Achieve Goals: Fair ADL Goals Pt Will Perform Grooming: with modified independence;with set-up;sitting Pt Will Perform Upper Body Bathing: with min assist;sitting Pt Will Perform Upper Body Dressing: with min assist;sitting Pt/caregiver will Perform Home Exercise Program: Increased ROM;Increased strength;Both right and left upper extremity;With minimal assist;With written HEP provided (AROM Right UE; PROM Left UE; decreased pain in Left UE)   OT Frequency:  Min 2X/week    Co-evaluation PT/OT/SLP Co-Evaluation/Treatment: Yes Reason for Co-Treatment: Necessary to address cognition/behavior during functional activity;For patient/therapist  safety PT goals addressed during session: Mobility/safety with mobility;Balance OT goals addressed during session: ADL's and self-care;Strengthening/ROM      AM-PAC OT 6 Clicks Daily Activity     Outcome Measure Help from another person eating meals?: A Lot Help from another person taking care of personal grooming?: A Little Help from another person toileting, which includes using toliet, bedpan, or urinal?: Total Help from another person bathing (including washing, rinsing, drying)?: A Lot Help from another person to put on and taking off regular upper body clothing?: A Lot Help from another person to put on and taking off regular lower body clothing?: Total 6 Click Score: 11   End of Session Equipment Utilized During Treatment: Gait belt Nurse Communication: Mobility status  Activity Tolerance: Patient limited by fatigue Patient left: in bed;with call bell/phone within reach;with bed alarm set  OT Visit Diagnosis: Other abnormalities of gait and mobility (R26.89);Muscle weakness (generalized) (M62.81);Hemiplegia and hemiparesis                Time: 8660-8595 OT Time Calculation (min): 25 min Charges:  OT General Charges $OT Visit: 1 Visit OT Evaluation $OT Eval Moderate Complexity: 1 Mod  Margarie Rockey HERO., OTR/L, MA Acute Rehab [phone]   Margarie FORBES Horns 10/22/2024, 6:08 PM

## 2024-10-22 NOTE — Progress Notes (Signed)
 Transition of Care [facility]) - Inpatient Brief Assessment Patient admitted to the hospital with Acute Metabolic encephalopathy.  The patient is confused and daughter is at the bedside to provide history.  The patient's daughter states that the patient is bedbound at home and has been increasingly confused at the home.  The daughter states that patient has a PEG and was eating some up until a week ago but stopped.  The daughter provides 24 hour care in the home - along with skilled services through [facility] for Pt, OT, ST.  The daughter was tearful and states that the patient has been difficult to take care of lately and she states that when she takes her home that she will likely hire aide through home health agency for personal care.  I provided the daughter with information for private pay personal care services.  Daughter plans to call and follow up.   The daughter states that she may have to eventually look into LTC at [place] in the future.    I will place home health orders to continued services through [facility] PT, OT, ST and MSW so that they may assist with LTC in the future if needed.  Patient has DMe at the home including PEG tube - supplies/formula through [employer] (bolus feeds), hospital bed, hoyer lift, BSC, WC and Rw.  The patient is normally bed bound unless up in recliner by hoyer lift.  Patient is currently getting EEG at the bedside.  CM will continue to follow the patient for IP Care management needs.  PT/OT pending at this time.  Patient Details  Name: FLORELLA MCNEESE MRN: [id number] Date of Birth: Jul 15, 1932  Transition of Care [facility]) CM/SW Contact:    Rosaline JONELLE Joe, RN Phone Number: 10/22/2024, 11:31 AM   Clinical Narrative:    Transition of Care Asessment: Insurance and Status: (P) Insurance coverage has been reviewed Patient has primary care physician: (P) Yes Home environment has been reviewed: (P) from home with daughter Prior level of  function:: (P) Bedbound Prior/Current Home Services: (P) Current home services (Active with [facility] for PT, OT, St - will add MSW) Social Drivers of Health Review: (P) SDOH reviewed needs interventions Readmission risk has been reviewed: (P) Yes Transition of care needs: (P) transition of care needs identified, TOC will continue to follow

## 2024-10-22 NOTE — Progress Notes (Signed)
 EEG complete. Results pending.  ?

## 2024-10-22 NOTE — Procedures (Signed)
 Patient Name: [name]  MRN: [id number]  Epilepsy Attending: Arlin MALVA Krebs  Referring Physician/Provider: Verdene Purchase, MD  Date: 10/22/2024 Duration: 23.04 mins  Patient history: 89 year old female with altered mental status.  EEG to evaluate for seizure.  Level of alertness: Awake  AEDs during EEG study: None  Technical aspects: This EEG study was done with scalp electrodes positioned according to the 10-20 International system of electrode placement. Electrical activity was reviewed with band pass filter of 1-70Hz , sensitivity of 7 uV/mm, display speed of 66mm/sec with a 60Hz  notched filter applied as appropriate. EEG data were recorded continuously and digitally stored.  Video monitoring was available and reviewed as appropriate.  Description: The posterior dominant rhythm consists of 8Hz  activity of moderate voltage (25-35 uV) seen predominantly in posterior head regions, symmetric and reactive to eye opening and eye closing. EEG showed continuous 3 to 6 Hz theta-delta slowing in right hemisphere. Hyperventilation and photic stimulation were not performed.     ABNORMALITY - Continuous slow, right hemisphere  IMPRESSION: This study is suggestive of cortical dysfunction arising from right hemisphere likely secondary to underlying structural abnormality. No seizures or epileptiform discharges were seen throughout the recording.   Erbie Arment O [name]

## 2024-10-22 NOTE — Progress Notes (Signed)
 PT Cancellation Note  Patient Details Name: [name] MRN: [id number] DOB: 13-May-1932   Cancelled Treatment:    Reason Eval/Treat Not Completed: Patient at procedure or test/unavailable; attempted twice this am to see pt, initially with nursing for feeds, then getting EEG.  Will continue attempts.    Montie Portal 10/22/2024, 11:46 AM Micheline Portal, PT Acute Rehabilitation Services Office:[phone] 10/22/2024

## 2024-10-22 NOTE — Progress Notes (Signed)
 Initial Nutrition Assessment  DOCUMENTATION CODES:   Non-severe (moderate) malnutrition in context of social or environmental circumstances  INTERVENTION:  Recommend bolus feedings via G-tube:  Osmolite 1.5 237 ml  TID  Prosource TF20 60 ml daily  Free water  flushes 60 ml before and after each bolus feeding.   Provides: 1145 kcal, 65 gm protein, 903 ml free water  daily (TF + flushes).  Downgrade diet ot DYS 1/NTL, recommend speech consult for dysphagia evaluation  Magic cup BID with meals, each supplement provides 290 kcal and 9 grams of protein Start 48 hr calorie count to assess PO intake     NUTRITION DIAGNOSIS:   Moderate Malnutrition related to social / environmental circumstances as evidenced by mild muscle depletion, mild fat depletion.   GOAL:   Patient will meet greater than or equal to 90% of their needs   MONITOR:   PO intake, TF tolerance, Supplement acceptance, Weight trends  REASON FOR ASSESSMENT:   Consult Enteral/tube feeding initiation and management  ASSESSMENT:   History of CVA with residual left hemiparesis, OSA, emphysema, depression/anxiety, presenting with cough and altered mental status. Currently uses PEG tube for feedings but is encouraged to take p.o.  Pt seen in room, no family at bedside. Pt with PEG tube present and takes in PO at home per chart review. Pt mildly confused today, able to tell me her name, what city she is in and remembers what consistency diet she is at home; needed redirection to where she was presently [name] [name]) and unable to provide detailed diet or weight history. She does reports that she would get feedings via PEG tube if she did not eat enough at home, unsure if she ate any breakfast this morning and denies feeling hungry during visit. Per chart review, patient was discharged on DYS 1 + nectar thick liquids with bolus feeds of Osmolite 1.5 (237 ml) TID with Prosource TF20 daily. Currently has regular/thin  liquid diet ordred, RD to downgrade back to baseline diet. Message sent to MD to place speech eval for further evaluation of dysphagia.    Admit weight: 65.1 kg  Current weight: 65.1 kg 4.4% weight loss x 1 month per chart hx, not clinically significant  Nutritionally Relevant Medications: Reviewed.  Labs Reviewed: K 3.3   NUTRITION - FOCUSED PHYSICAL EXAM:  Flowsheet Row Most Recent Value  Orbital Region Mild depletion  Upper Arm Region Moderate depletion  Thoracic and Lumbar Region Mild depletion  Buccal Region Mild depletion  Temple Region Mild depletion  Clavicle Bone Region No depletion  Clavicle and Acromion Bone Region Mild depletion  Dorsal Hand Mild depletion  Patellar Region No depletion  Anterior Thigh Region No depletion  Posterior Calf Region Unable to assess  Hair Reviewed  Eyes Reviewed  Mouth Reviewed  Skin Reviewed  Nails Reviewed    Diet Order:   Diet Order             DIET - DYS 1 Room service appropriate? Yes with Assist; Fluid consistency: Nectar Thick  Diet effective now                   EDUCATION NEEDS:   Not appropriate for education at this time  Skin:  Skin Assessment: Reviewed RN Assessment  Last BM:  PTA  Height:   Ht Readings from Last 1 Encounters:  09/11/24 5' 1 (1.549 m)    Weight:   Wt Readings from Last 1 Encounters:  10/22/24 65.1 kg    Ideal  Body Weight:  47.8 kg  BMI:  Body mass index is 27.12 kg/m.  Estimated Nutritional Needs:   Kcal:  1400-1600  Protein:  70-85g  Fluid:  1.4-1.6L  Pape Parson, MS, RD, LDN Clinical Dietitian  Contact via secure chat. If unavailable, use group chat RD Inpatient.

## 2024-10-22 NOTE — Progress Notes (Signed)
 OT Cancellation Note  Patient Details Name: ANNITA RATLIFF MRN: [id number] DOB: [birth year]   Cancelled Treatment:    Reason Eval/Treat Not Completed: Patient at procedure or test/ unavailable (Pt currently completing EEG. OT to reattemtpt to see pt at a later time as appropriate/available.)  Margarie Rockey HERO., OTR/L, MA Acute Rehab [phone]   Margarie FORBES Horns 10/22/2024, 11:16 AM

## 2024-10-22 NOTE — Evaluation (Signed)
 Clinical/Bedside Swallow Evaluation Patient Details  Name: [name] MRN: [id number] Date of Birth: 06/29/1932  Today's Date: 10/22/2024 Time: SLP Start Time (ACUTE ONLY): 1320 SLP Stop Time (ACUTE ONLY): 1345 SLP Time Calculation (min) (ACUTE ONLY): 25 min  Past Medical History:  Past Medical History:  Diagnosis Date   Anxiety    Back pain    Cancer (HCC)    Depression    Emphysema    Esophageal spasm    Lung nodule    OSA (obstructive sleep apnea)    Osteomalacia    Personal history of radiation therapy    Stroke (HCC)    Vertigo    Vitamin D  deficiency    Past Surgical History:  Past Surgical History:  Procedure Laterality Date   ABDOMINAL HYSTERECTOMY     APPENDECTOMY     BREAST BIOPSY Right 08/31/2020   BREAST EXCISIONAL BIOPSY Left 1978   BREAST LUMPECTOMY Right 09/30/2020   BREAST LUMPECTOMY WITH RADIOACTIVE SEED LOCALIZATION Right 09/30/2020   Procedure: RIGHT BREAST LUMPECTOMY WITH RADIOACTIVE SEED LOCALIZATION;  Surgeon: Curvin Deward MOULD, MD;  Location: [facility];  Service: General;  Laterality: Right;   CARPAL TUNNEL RELEASE     IR CT HEAD LTD  06/25/2024   IR GASTROSTOMY TUBE MOD SED  07/14/2024   IR PERCUTANEOUS ART THROMBECTOMY/INFUSION INTRACRANIAL INC DIAG ANGIO  06/25/2024   RADIOLOGY WITH ANESTHESIA N/A 06/25/2024   Procedure: RADIOLOGY WITH ANESTHESIA;  Surgeon: Dolphus Carrion, MD;  Location: [facility] OR;  Service: Radiology;  Laterality: N/A;   ROTATOR CUFF REPAIR     HPI:  Pt is a 89 year old female, lives with daughter as caregiver. Pt admitted with cough, AMS, found to have pna, likely viral per MD. Pt has a history of dysphagia and has a PEG tube. Eats and drinks at home. Was recently hospitalized for stroke and intubated, discharged from CIR on 07/31/2024 consuming purees and nectar thick liquids but also with a [name] free water  protocol. Was only on purees due to jaw pain at the time, but demonstrated ability to chew.    Assessment /  Plan / Recommendation  Clinical Impression  Pt reports she continues to eat pureed foods and nectar thick liquids at home. She doesnt have top denture and has no interest in trying to eat regular texture foods. She also says she doesnt mind thick liquids and thinks she does best with these. SLP observed pt take a small sip of thin water  which did elicit a cough and then self fed nectar thick tea without coughing. Called daughter to confirm report. Daughter says, the pt only drinks nectar, does not use a water  protocol, only eats purees. Offered repeat MBS while admitted to see if upgrade is warranted. Daughter feels there is no need and pt will plan to continue current diet and tolerates it well. No SLP f/u desired by family. SLP Visit Diagnosis: Dysphagia, unspecified (R13.10)    Aspiration Risk  Mild aspiration risk    Diet Recommendation Dysphagia 1 (Puree);Nectar-thick liquid    Liquid Administration via: Cup Medication Administration: Via alternative means Supervision: Staff to assist with self feeding Compensations: Slow rate;Small sips/bites Postural Changes: Seated upright at 90 degrees    Other  Recommendations Oral Care Recommendations: Oral care BID     Assistance Recommended at Discharge    Functional Status Assessment    Frequency and Duration            Prognosis  Swallow Study   General HPI: Pt is a 89 year old female, lives with daughter as caregiver. Pt admitted with cough, AMS, found to have pna, likely viral per MD. Pt has a history of dysphagia and has a PEG tube. Eats and drinks at home. Was recently hospitalized for stroke and intubated, discharged from CIR on 07/31/2024 consuming purees and nectar thick liquids but also with a [name] free water  protocol. Was only on purees due to jaw pain at the time, but demonstrated ability to chew. Type of Study: Bedside Swallow Evaluation Previous Swallow Assessment: See HPI Diet Prior to this Study: Dysphagia 1  (pureed);Mildly thick liquids (Level 2, nectar thick) Temperature Spikes Noted: No Respiratory Status: Room air History of Recent Intubation: No Behavior/Cognition: Alert;Cooperative;Pleasant mood Oral Cavity Assessment: Within Functional Limits Oral Care Completed by SLP: No Oral Cavity - Dentition: Missing dentition Vision: Functional for self-feeding Self-Feeding Abilities: Able to feed self Patient Positioning: Upright in bed Baseline Vocal Quality: Normal Volitional Cough: Strong Volitional Swallow: Able to elicit    Oral/Motor/Sensory Function Overall Oral Motor/Sensory Function: Within functional limits   Ice Chips Ice chips: Not tested   Thin Liquid Thin Liquid: Impaired Presentation: Self Fed;Cup Pharyngeal  Phase Impairments: Cough - Immediate    Nectar Thick Nectar Thick Liquid: Within functional limits Presentation: Cup;Self Fed   Honey Thick Honey Thick Liquid: Not tested   Puree Puree: Not tested   Solid     Solid: Not tested      [name], [name] 10/22/2024,1:44 PM

## 2024-10-22 NOTE — Care Management Obs Status (Signed)
 MEDICARE OBSERVATION STATUS NOTIFICATION   Patient Details  Name: ELLICE BOULTINGHOUSE MRN: [id number] Date of Birth: [birth year]   Medicare Observation Status Notification Given:  Yes  Obs notice done over th phone and a copy was given to Patient daughter [name] Claretta Deed 10/22/2024, 12:54 PM

## 2024-10-23 DIAGNOSIS — G9341 Metabolic encephalopathy: Secondary | ICD-10-CM | POA: Diagnosis not present

## 2024-10-23 DIAGNOSIS — I10 Essential (primary) hypertension: Secondary | ICD-10-CM | POA: Diagnosis not present

## 2024-10-23 LAB — CBC
HCT: 36.5 % (ref 36.0–46.0)
Hemoglobin: 12.1 g/dL (ref 12.0–15.0)
MCH: 31.1 pg (ref 26.0–34.0)
MCHC: 33.2 g/dL (ref 30.0–36.0)
MCV: 93.8 fL (ref 80.0–100.0)
Platelets: 170 K/uL (ref 150–400)
RBC: 3.89 MIL/uL (ref 3.87–5.11)
RDW: 13.5 % (ref 11.5–15.5)
WBC: 8.5 K/uL (ref 4.0–10.5)
nRBC: 0 % (ref 0.0–0.2)

## 2024-10-23 LAB — AMMONIA: Ammonia: 15 umol/L (ref 9–35)

## 2024-10-23 LAB — RPR: RPR Ser Ql: NONREACTIVE

## 2024-10-23 LAB — HIV ANTIBODY (ROUTINE TESTING W REFLEX): HIV Screen 4th Generation wRfx: NONREACTIVE

## 2024-10-23 LAB — COMPREHENSIVE METABOLIC PANEL WITH GFR
ALT: 16 U/L (ref 0–44)
AST: 19 U/L (ref 15–41)
Albumin: 2.9 g/dL — ABNORMAL LOW (ref 3.5–5.0)
Alkaline Phosphatase: 72 U/L (ref 38–126)
Anion gap: 11 (ref 5–15)
BUN: 14 mg/dL (ref 8–23)
CO2: 23 mmol/L (ref 22–32)
Calcium: 8.5 mg/dL — ABNORMAL LOW (ref 8.9–10.3)
Chloride: 104 mmol/L (ref 98–111)
Creatinine, Ser: 0.47 mg/dL (ref 0.44–1.00)
GFR, Estimated: >60 mL/min (ref >60)
Glucose, Bld: 102 mg/dL — ABNORMAL HIGH (ref 70–99)
Potassium: 3.6 mmol/L (ref 3.5–5.1)
Sodium: 138 mmol/L (ref 135–145)
Total Bilirubin: 0.3 mg/dL (ref 0.0–1.2)
Total Protein: 6.1 g/dL — ABNORMAL LOW (ref 6.5–8.1)

## 2024-10-23 LAB — TSH: TSH: 2.82 u[IU]/mL (ref 0.350–4.500)

## 2024-10-23 MED ORDER — QUETIAPINE FUMARATE 25 MG PO TABS
25.0000 mg | ORAL_TABLET | Freq: Every day | ORAL | Status: DC
  Administered 2024-10-23: 25 mg
  Filled 2024-10-23: qty 1

## 2024-10-23 MED ORDER — POTASSIUM CHLORIDE 20 MEQ PO PACK
40.0000 meq | PACK | Freq: Once | ORAL | Status: AC
  Administered 2024-10-23: 40 meq
  Filled 2024-10-23: qty 2

## 2024-10-23 MED ORDER — ARFORMOTEROL TARTRATE 15 MCG/2ML IN NEBU
15.0000 ug | INHALATION_SOLUTION | Freq: Two times a day (BID) | RESPIRATORY_TRACT | Status: DC
  Administered 2024-10-23 – 2024-10-24 (×2): 15 ug via RESPIRATORY_TRACT
  Filled 2024-10-23 (×2): qty 2

## 2024-10-23 MED ORDER — REVEFENACIN 175 MCG/3ML IN SOLN
175.0000 ug | Freq: Every day | RESPIRATORY_TRACT | Status: DC
  Administered 2024-10-23: 175 ug via RESPIRATORY_TRACT
  Filled 2024-10-23: qty 3

## 2024-10-23 NOTE — Progress Notes (Signed)
 Calorie Count Note  48 hour calorie count ordered.  Diet: DYS 1/NTL Supplements: Magic Cup BID    Estimated Nutritional Needs:    Kcal:  1400-1600   Protein:  70-85g   Fluid:  1.4-1.6L  Lunch 11/5: 25% sweet tea, few bites chicken and mashed potatoes ( 15 kcals, 0 grams)  Dinner 11/5: 25% turkey, 25% sweet tea ( 77 kcals, 5 grams) Breakfast 11/6: No ticket, tray at bedside untouched  Supplements: 0% Magic Cup   Total intake: 92 kcal (<25% of minimum estimated needs)  5 protein (<25% of minimum estimated needs)  Nutrition Dx: Moderate Malnutrition related to social / environmental circumstances as evidenced by mild muscle depletion, mild fat depletion.    Goal:  Patient will meet greater than or equal to 90% of their needs   Intervention:  Continue bolus feedings via G-tube:  Osmolite 1.5 237 ml  TID Prosource TF20 60 ml daily   Free water  flushes 60 ml before and after each bolus feeding.  Provides: 1145 kcal, 65 gm protein, 903 ml free water  daily (TF + flushes).   Continue DYS 1/NTL Magic cup BID with meals, each supplement provides 290 kcal and 9 grams of protein Continue 48 hr calorie count to assess PO intake   Madalyn Potters, MS, RD, LDN Clinical Dietitian  Contact via secure chat. If unavailable, use group chat RD Inpatient.

## 2024-10-23 NOTE — Plan of Care (Signed)

## 2024-10-23 NOTE — Progress Notes (Signed)
 [facility] PROGRESS NOTE   [name] FMW:[id number] DOB: 1932-05-15 DOA: 10/21/2024  PCP: [name], MD  Brief History: 89 y.o. female with history of CVA with residual left hemiparesis, OSA, emphysema, depression/anxiety, presented with cough and altered mental status.  History was corroborated by daughter.  Patient was hospitalized for further management of altered mental status.   Consultants: None  Procedures: EEG   Subjective/Interval History: Patient noted to be much improved this morning.  She knew she was in the hospital.  Daughter is at the bedside.  Daughter also feels that patient is much better today.  Denies any headaches.  No chest pain shortness of breath.  No nausea or vomiting.    Assessment/Plan:  Acute metabolic encephalopathy Etiology most likely due to psychotropic medications.  Patient was on multiple different psychotropic medications including Lexapro  Wellbutrin , Lamictal , modafinil , gabapentin .  Of these the Lexapro  and Wellbutrin  are chronic medications.  All the others were discontinued. MRI brain did not show any acute findings.  EEG was also unremarkable. TSH is normal.  RPR is nonreactive.  Recently checked B12 and folic acid levels were unremarkable. With discontinuation of the above medications mentation appears to have improved.  She does have left hemiparesis from her recent stroke.  This is unchanged. UA was unremarkable. Patient was started on Seroquel which will be continued at low-dose for now. Patient appears to have improved this morning.  Community-acquired pneumonia Likely viral.  She has completed 5 days of antibiotics.  Chest x-ray did not show any acute findings.  COVID-19 influenza and RSV negative.  No respiratory symptoms noted.  Not noted to be on antibiotics currently.  WBC is noted to be normal.  She is afebrile.   Respiratory viral panel is negative as well.  Hypokalemia Supplement as indicated.  History of  right MCA CVA with left hemiparesis This occurred in August.  Noted to be on aspirin .  MRI brain was repeated and does not show any new findings.  Dehydration Given IV fluids.  History of COPD/emphysema No exacerbation noted.  Essential hypertension Monitor blood pressures closely.  Obstructive sleep apnea CPAP  Severe protein calorie malnutrition Has a PEG tube but patient's family has been working on weaning her off of it.  Dietitian consulted.  Physical deconditioning PT and OT evaluation.  Home health is recommended  DVT Prophylaxis: Subcutaneous heparin  Code Status: DNR Family Communication: Daughter updated at bedside Disposition Plan: Home health    Medications: Scheduled:  aspirin   81 mg Per Tube Daily   atorvastatin   10 mg Per Tube QHS   buPROPion   75 mg Per Tube BID   escitalopram   40 mg Per Tube Daily   feeding supplement (OSMOLITE 1.5 CAL)  237 mL Per Tube TID PC   feeding supplement (PROSource TF20)  60 mL Per Tube QPC breakfast   free water   120 mL Per Tube TID PC   heparin   5,000 Units Subcutaneous Q8H   melatonin  5 mg Per Tube QHS   QUEtiapine  25 mg Per Tube QHS   Continuous:   PRN:albuterol , artificial tears, busPIRone , haloperidol lactate, hydrALAZINE , nitroGLYCERIN , ondansetron  **OR** ondansetron  (ZOFRAN ) IV, polyethylene glycol, traZODone    Objective:  Vital Signs  Vitals:   10/22/24 2053 10/22/24 2351 10/23/24 0535 10/23/24 0802  BP: (!) 154/65 (!) 141/57 128/84 (!) 166/68  Pulse: 83 73 72 82  Resp: 18 18 18 19   Temp:  97.6 F (36.4 C) 98.5 F (36.9 C) 98.1 F (36.7 C)  TempSrc:   Oral   SpO2: 95% 95% 99% 94%  Weight:        Intake/Output Summary (Last 24 hours) at 10/23/2024 0900 Last data filed at 10/23/2024 0500 Gross per 24 hour  Intake 420 ml  Output 1600 ml  Net -1180 ml    General appearance: Awake alert.  In no distress Resp: Clear to auscultation bilaterally.  Normal effort Cardio: S1-S2 is normal regular.  No  S3-S4.  No rubs murmurs or bruit GI: Abdomen is soft.  Nontender nondistended.  Bowel sounds are present normal.  No masses organomegaly Extremities: No edema.   Neurologic: Alert and oriented x3.  Left hemiparesis    Lab Results:  Data Reviewed: I have personally reviewed following labs and reports of the imaging studies  CBC: Recent Labs  Lab 10/21/24 1249 10/22/24 0327 10/23/24 0103  WBC 10.0 8.5 8.5  HGB 12.7 12.5 12.1  HCT 38.4 36.8 36.5  MCV 95.5 93.6 93.8  PLT 176 154 170    Basic Metabolic Panel: Recent Labs  Lab 10/21/24 1249 10/22/24 0327 10/23/24 0103  NA 140 138 138  K 3.7 3.3* 3.6  CL 101 102 104  CO2 29 25 23   GLUCOSE 106* 95 102*  BUN 15 10 14   CREATININE 0.57 0.57 0.47  CALCIUM  9.1 8.9 8.5*  MG  --  1.8  --     GFR: Estimated Creatinine Clearance: 40.4 mL/min (by C-G formula based on SCr of 0.47 mg/dL).  Liver Function Tests: Recent Labs  Lab 10/21/24 1249 10/23/24 0103  AST 19 19  ALT 16 16  ALKPHOS 87 72  BILITOT 0.3 0.3  PROT 6.3* 6.1*  ALBUMIN 3.2* 2.9*    CBG: Recent Labs  Lab 10/21/24 1246  GLUCAP 118*     Recent Results (from the past 240 hours)  Resp panel by RT-PCR (RSV, Flu A&B, Covid) Anterior Nasal Swab     Status: None   Collection Time: 10/21/24  1:12 PM   Specimen: Anterior Nasal Swab  Result Value Ref Range Status   SARS Coronavirus 2 by RT PCR NEGATIVE NEGATIVE Final   Influenza A by PCR NEGATIVE NEGATIVE Final   Influenza B by PCR NEGATIVE NEGATIVE Final    Comment: (NOTE) The Xpert Xpress SARS-CoV-2/FLU/RSV plus assay is intended as an aid in the diagnosis of influenza from Nasopharyngeal swab specimens and should not be used as a sole basis for treatment. Nasal washings and aspirates are unacceptable for Xpert Xpress SARS-CoV-2/FLU/RSV testing.  Fact Sheet for Patients: bloggercourse.com  Fact Sheet for Healthcare Providers: seriousbroker.it  This  test is not yet approved or cleared by the [place]  FDA and has been authorized for detection and/or diagnosis of SARS-CoV-2 by FDA under an Emergency Use Authorization (EUA). This EUA will remain in effect (meaning this test can be used) for the duration of the COVID-19 declaration under Section 564(b)(1) of the Act, 21 U.S.C. section 360bbb-3(b)(1), unless the authorization is terminated or revoked.     Resp Syncytial Virus by PCR NEGATIVE NEGATIVE Final    Comment: (NOTE) Fact Sheet for Patients: bloggercourse.com  Fact Sheet for Healthcare Providers: seriousbroker.it  This test is not yet approved or cleared by the [place]  FDA and has been authorized for detection and/or diagnosis of SARS-CoV-2 by FDA under an Emergency Use Authorization (EUA). This EUA will remain in effect (meaning this test can be used) for the duration of the COVID-19 declaration under Section 564(b)(1) of the Act, 21 U.S.C. section 360bbb-3(b)(1),  unless the authorization is terminated or revoked.  Performed at [facility], [address], [place], [place] [address]   Respiratory (~20 pathogens) panel by PCR     Status: None   Collection Time: 10/22/24  3:37 AM   Specimen: Nasopharyngeal Swab; Respiratory  Result Value Ref Range Status   Adenovirus NOT DETECTED NOT DETECTED Final   Coronavirus 229E NOT DETECTED NOT DETECTED Final    Comment: (NOTE) The Coronavirus on the Respiratory Panel, DOES NOT test for the novel  Coronavirus (2019 nCoV)    Coronavirus HKU1 NOT DETECTED NOT DETECTED Final   Coronavirus NL63 NOT DETECTED NOT DETECTED Final   Coronavirus OC43 NOT DETECTED NOT DETECTED Final   Metapneumovirus NOT DETECTED NOT DETECTED Final   Rhinovirus / Enterovirus NOT DETECTED NOT DETECTED Final   Influenza A NOT DETECTED NOT DETECTED Final   Influenza B NOT DETECTED NOT DETECTED Final   Parainfluenza Virus 1 NOT DETECTED NOT  DETECTED Final   Parainfluenza Virus 2 NOT DETECTED NOT DETECTED Final   Parainfluenza Virus 3 NOT DETECTED NOT DETECTED Final   Parainfluenza Virus 4 NOT DETECTED NOT DETECTED Final   Respiratory Syncytial Virus NOT DETECTED NOT DETECTED Final   Bordetella pertussis NOT DETECTED NOT DETECTED Final   Bordetella Parapertussis NOT DETECTED NOT DETECTED Final   Chlamydophila pneumoniae NOT DETECTED NOT DETECTED Final   Mycoplasma pneumoniae NOT DETECTED NOT DETECTED Final    Comment: Performed at [facility], [address] [place], [place] [address]      Radiology Studies: EEG adult Result Date: 10/22/2024 Shelton Arlin KIDD, MD     10/22/2024  2:55 PM Patient Name: [name] MRN: [id number] Epilepsy Attending: Arlin KIDD Shelton Referring Physician/Provider: Verdene Purchase, MD Date: 10/22/2024 Duration: 23.04 mins Patient history: 89 year old female with altered mental status.  EEG to evaluate for seizure. Level of alertness: Awake AEDs during EEG study: None Technical aspects: This EEG study was done with scalp electrodes positioned according to the 10-20 International system of electrode placement. Electrical activity was reviewed with band pass filter of 1-70Hz , sensitivity of 7 uV/mm, display speed of 75mm/sec with a 60Hz  notched filter applied as appropriate. EEG data were recorded continuously and digitally stored.  Video monitoring was available and reviewed as appropriate. Description: The posterior dominant rhythm consists of 8Hz  activity of moderate voltage (25-35 uV) seen predominantly in posterior head regions, symmetric and reactive to eye opening and eye closing. EEG showed continuous 3 to 6 Hz theta-delta slowing in right hemisphere. Hyperventilation and photic stimulation were not performed.   ABNORMALITY - Continuous slow, right hemisphere IMPRESSION: This study is suggestive of cortical dysfunction arising from right hemisphere likely secondary to underlying structural  abnormality. No seizures or epileptiform discharges were seen throughout the recording. Arlin KIDD Shelton   MR BRAIN WO CONTRAST Result Date: 10/21/2024 EXAM: MRI BRAIN WITHOUT CONTRAST 10/21/2024 09:50:32 PM TECHNIQUE: Multiplanar multisequence MRI of the head/brain was performed without the administration of intravenous contrast. COMPARISON: CT head earlier today. MRI head 06/26/2024 CLINICAL HISTORY: Mental status change, unknown cause. FINDINGS: BRAIN AND VENTRICLES: No acute infarct. Sequela of prior right MCA territory infarct including encephalomalacia and extensive hemosiderin deposition from prior hemorrhage No acute intracranial hemorrhage. No mass. No midline shift. No hydrocephalus. Patchy white matter T2 hyperintensities, compatible with chronic microvascular ischemic change. Cerebral atrophy. ORBITS: No acute abnormality. SINUSES AND MASTOIDS: No acute abnormality. BONES AND SOFT TISSUES: Normal marrow signal. No acute soft tissue abnormality. IMPRESSION: 1. No  acute intracranial abnormality. 2. Prior right MCA territory infarct. Electronically signed by: Gilmore Molt MD 10/21/2024 10:20 PM EST RP Workstation: [id number]   DG Chest Portable 1 View Result Date: 10/21/2024 CLINICAL DATA:  URI sxs, AMS EXAM: PORTABLE CHEST - 1 VIEW COMPARISON:  10/09/2024 FINDINGS: Lower lung volumes. no focal airspace consolidation, pleural effusion, or pneumothorax. No cardiomegaly. Tortuous aorta with aortic atherosclerosis. No acute fracture or destructive lesions. Multilevel thoracic osteophytosis. IMPRESSION: No acute cardiopulmonary abnormality. Electronically Signed   By: Rogelia Myers M.D.   On: 10/21/2024 14:15   CT Head Wo Contrast Result Date: 10/21/2024 EXAM: CT HEAD WITHOUT CONTRAST 10/21/2024 01:50:00 PM TECHNIQUE: CT of the head was performed without the administration of intravenous contrast. Automated exposure control, iterative reconstruction, and/or weight based adjustment of the mA/kV was  utilized to reduce the radiation dose to as low as reasonably achievable. COMPARISON: Head CT 07/29/2024 and MRI 06/26/2024. CLINICAL HISTORY: Mental status change, unknown cause. FINDINGS: BRAIN AND VENTRICLES: There is no evidence of an acute infarct, intracranial hemorrhage, mass, midline shift, hydrocephalus, or extra-axial fluid collection. There has been expected interval evolution of the large right MCA infarct with development of encephalomalacia and ex vacuo dilatation of the right lateral ventricle. There is mild cerebral atrophy. Patchy hypodensities elsewhere in the cerebral white matter bilaterally are nonspecific but compatible with moderate chronic small vessel ischemic disease. Calcified atherosclerosis at the skull base. ORBITS: Bilateral cataract extraction. SINUSES: No acute abnormality. SOFT TISSUES AND SKULL: No acute soft tissue abnormality. No skull fracture. IMPRESSION: 1. No acute intracranial abnormality. 2. Large chronic right MCA infarct. 3. Moderate chronic small vessel ischemic disease. Electronically signed by: Dasie Hamburg MD 10/21/2024 02:12 PM EST RP Workstation: [id number]       LOS: 1 day   Joette Pebbles  [facility] Pager on [email or web address]  10/23/2024, 9:00 AM

## 2024-10-24 ENCOUNTER — Emergency Department (HOSPITAL_COMMUNITY)

## 2024-10-24 ENCOUNTER — Other Ambulatory Visit: Payer: Self-pay

## 2024-10-24 ENCOUNTER — Other Ambulatory Visit (HOSPITAL_COMMUNITY): Payer: Self-pay

## 2024-10-24 ENCOUNTER — Emergency Department (HOSPITAL_COMMUNITY)
Admission: EM | Admit: 2024-10-24 | Discharge: 2024-10-25 | Disposition: A | Discharge status: Home / Self Care | Attending: Emergency Medicine | Admitting: Emergency Medicine

## 2024-10-24 ENCOUNTER — Telehealth (HOSPITAL_COMMUNITY): Payer: Self-pay

## 2024-10-24 ENCOUNTER — Encounter (HOSPITAL_COMMUNITY): Payer: Self-pay

## 2024-10-24 DIAGNOSIS — I63511 Cerebral infarction due to unspecified occlusion or stenosis of right middle cerebral artery: Secondary | ICD-10-CM | POA: Diagnosis not present

## 2024-10-24 DIAGNOSIS — J449 Chronic obstructive pulmonary disease, unspecified: Secondary | ICD-10-CM | POA: Insufficient documentation

## 2024-10-24 DIAGNOSIS — Z8673 Personal history of transient ischemic attack (TIA), and cerebral infarction without residual deficits: Secondary | ICD-10-CM | POA: Insufficient documentation

## 2024-10-24 DIAGNOSIS — I10 Essential (primary) hypertension: Secondary | ICD-10-CM

## 2024-10-24 DIAGNOSIS — G9341 Metabolic encephalopathy: Secondary | ICD-10-CM | POA: Diagnosis not present

## 2024-10-24 DIAGNOSIS — R4182 Altered mental status, unspecified: Secondary | ICD-10-CM | POA: Diagnosis not present

## 2024-10-24 DIAGNOSIS — G8192 Hemiplegia, unspecified affecting left dominant side: Secondary | ICD-10-CM | POA: Insufficient documentation

## 2024-10-24 DIAGNOSIS — S2249XA Multiple fractures of ribs, unspecified side, initial encounter for closed fracture: Secondary | ICD-10-CM | POA: Diagnosis not present

## 2024-10-24 DIAGNOSIS — Z859 Personal history of malignant neoplasm, unspecified: Secondary | ICD-10-CM | POA: Insufficient documentation

## 2024-10-24 DIAGNOSIS — I700 Atherosclerosis of aorta: Secondary | ICD-10-CM | POA: Diagnosis not present

## 2024-10-24 LAB — CBC
HCT: 39.8 % (ref 36.0–46.0)
Hemoglobin: 13.4 g/dL (ref 12.0–15.0)
MCH: 31.6 pg (ref 26.0–34.0)
MCHC: 33.7 g/dL (ref 30.0–36.0)
MCV: 93.9 fL (ref 80.0–100.0)
Platelets: 203 K/uL (ref 150–400)
RBC: 4.24 MIL/uL (ref 3.87–5.11)
RDW: 13.6 % (ref 11.5–15.5)
WBC: 10.4 K/uL (ref 4.0–10.5)
nRBC: 0 % (ref 0.0–0.2)

## 2024-10-24 LAB — COMPREHENSIVE METABOLIC PANEL WITH GFR
ALT: 18 U/L (ref 0–44)
AST: 26 U/L (ref 15–41)
Albumin: 3.3 g/dL — ABNORMAL LOW (ref 3.5–5.0)
Alkaline Phosphatase: 77 U/L (ref 38–126)
Anion gap: 10 (ref 5–15)
BUN: 16 mg/dL (ref 8–23)
CO2: 24 mmol/L (ref 22–32)
Calcium: 9 mg/dL (ref 8.9–10.3)
Chloride: 102 mmol/L (ref 98–111)
Creatinine, Ser: 0.46 mg/dL (ref 0.44–1.00)
GFR, Estimated: >60 mL/min (ref >60)
Glucose, Bld: 107 mg/dL — ABNORMAL HIGH (ref 70–99)
Potassium: 3.9 mmol/L (ref 3.5–5.1)
Sodium: 136 mmol/L (ref 135–145)
Total Bilirubin: 0.6 mg/dL (ref 0.0–1.2)
Total Protein: 6.2 g/dL — ABNORMAL LOW (ref 6.5–8.1)

## 2024-10-24 MED ORDER — LISINOPRIL 10 MG PO TABS
5.0000 mg | ORAL_TABLET | Freq: Once | ORAL | Status: AC
  Administered 2024-10-24: 5 mg via ORAL
  Filled 2024-10-24: qty 1

## 2024-10-24 MED ORDER — QUETIAPINE FUMARATE 25 MG PO TABS
25.0000 mg | ORAL_TABLET | Freq: Every day | ORAL | 0 refills | Status: AC
  Filled 2024-10-24: qty 60, 60d supply, fill #0

## 2024-10-24 MED ORDER — LOSARTAN POTASSIUM 25 MG PO TABS: 25.0000 mg | ORAL_TABLET | Freq: Every day | ORAL | 0 refills | Status: AC

## 2024-10-24 MED ORDER — GABAPENTIN 300 MG PO CAPS: 300.0000 mg | ORAL_CAPSULE | Freq: Every day | ORAL | Status: AC | PRN

## 2024-10-24 NOTE — TOC Transition Note (Signed)
 Transition of Care [facility]) - Discharge Note   Patient Details  Name: [name] MRN: [id number] Date of Birth: 16-Dec-1932  Transition of [facility] CM/SW Contact:  Rosaline JONELLE Joe, RN Phone Number: 10/24/2024, 10:54 AM   Clinical Narrative:    Patient has been scheduled for discharge to home today.  I spoke with the daughter and she requests [facility] to home.  [facility] was called for transportation to home.  [facility] was notified that patient is discharging and asked to resume services.   Final next level of care: Home/Self Care Barriers to Discharge: Barriers Resolved   Patient Goals and CMS Choice            Discharge Placement                  Name of family member notified: [name]/daughter [phone]    Discharge Plan and Services Additional resources added to the After Visit Summary for                  DME Arranged: Tube feeding DME Agency: [employer]                  Social Drivers of Health (SDOH) Interventions SDOH Screenings   Food Insecurity: No Food Insecurity (10/22/2024)  Housing: Low Risk  (10/22/2024)  Transportation Needs: Patient Unable To Answer (10/22/2024)  Utilities: Patient Unable To Answer (10/22/2024)  Depression (PHQ2-9): Medium Risk (09/08/2024)  Social Connections: Patient Unable To Answer (10/22/2024)  Tobacco Use: Medium Risk (10/21/2024)     Readmission Risk Interventions    10/24/2024   10:53 AM  Readmission Risk Prevention Plan  Transportation Screening Complete  Medication Review (RN Care Manager) Complete  PCP or Specialist appointment within 3-5 days of discharge Complete  HRI or Home Care Consult Complete  SW Recovery Care/Counseling Consult Complete  Palliative Care Screening Complete  Skilled Nursing Facility Not Applicable

## 2024-10-24 NOTE — Progress Notes (Signed)
  Calorie Count Note  48 hour calorie count completed. No tickets collected from yesterday. Based on day 1 results, patient with inadequate PO intake to meet estimated energy needs. Currently has discharge orders in.   Diet: DYS 1/NTL Supplements: Magic Cup BID    Estimated Nutritional Needs:    Kcal:  1400-1600   Protein:  70-85g   Fluid:  1.4-1.6L  Lunch 11/5: 25% sweet tea, few bites chicken and mashed potatoes ( 15 kcals, 0 grams)  Dinner 11/5: 25% turkey, 25% sweet tea ( 77 kcals, 5 grams) Breakfast 11/6: No ticket, tray at bedside untouched  Supplements: 0% Magic Cup   Total intake: 92 kcal (<25% of minimum estimated needs)  5 protein (<25% of minimum estimated needs)  Nutrition Dx: Moderate Malnutrition related to social / environmental circumstances as evidenced by mild muscle depletion, mild fat depletion.    Goal:  Patient will meet greater than or equal to 90% of their needs   Intervention:  Continue bolus feedings via G-tube: Osmolite 1.5 237 ml  TID Prosource TF20 60 ml daily Free water  flushes 60 ml before and after each bolus feeding.  Provides: 1145 kcal, 65 gm protein, 903 ml free water  daily (TF + flushes). Family can wean patient off tube feeds as able at home after discharge    Continue DYS 1/NTL Magic cup BID with meals, each supplement provides 290 kcal and 9 grams of protein  Chevie Birkhead, MS, RD, LDN Clinical Dietitian  Contact via secure chat. If unavailable, use group chat RD Inpatient.

## 2024-10-24 NOTE — Discharge Summary (Signed)
 [facility]  Physician Discharge Summary   Patient ID: [name] MRN: [id number] DOB/AGE: [birth year] 89 y.o.  Admit date: 10/21/2024 Discharge date: 10/24/2024    PCP: [name], MD  DISCHARGE DIAGNOSES:    Acute metabolic encephalopathy   COPD (chronic obstructive pulmonary disease) (HCC)   Hemiparesis affecting left side as late effect of cerebrovascular accident (HCC)   Volume depletion   CAP (community acquired pneumonia)   Essential hypertension   Delusion (HCC)   Malnutrition of moderate degree   RECOMMENDATIONS FOR OUTPATIENT FOLLOW UP: Patient to follow-up with PCP   Home Health: PT OT Equipment/Devices: None  CODE STATUS: DNR  DISCHARGE CONDITION: fair  Diet recommendation: As before  INITIAL HISTORY: 89 y.o. female with history of CVA with residual left hemiparesis, OSA, emphysema, depression/anxiety, presented with cough and altered mental status.  History was corroborated by daughter.  Patient was hospitalized for further management of altered mental status.   HOSPITAL COURSE:   Acute metabolic encephalopathy Etiology most likely due to psychotropic medications.  Patient was on multiple different psychotropic medications including Lexapro  Wellbutrin , Lamictal , modafinil , gabapentin .  Of these the Lexapro  and Wellbutrin  are chronic medications.  All the others were discontinued. MRI brain did not show any acute findings.  EEG was also unremarkable. TSH is normal.  RPR is nonreactive.  Recently checked B12 and folic acid levels were unremarkable. With discontinuation of the above medications mentation appears to have improved.  She does have left hemiparesis from her recent stroke.  This is unchanged. UA was unremarkable. Patient was started on Seroquel which will be continued at low-dose for now. She may take gabapentin  at low-dose as needed but no more than once a day.  All of this was communicated to patient's daughters.  Mentation is  stable this morning.  Okay for discharge home today.   Community-acquired pneumonia Likely viral.  She has completed 5 days of antibiotics.  Chest x-ray did not show any acute findings.  COVID-19 influenza and RSV negative.  No respiratory symptoms noted.  Not noted to be on antibiotics currently.  WBC is noted to be normal.  She is afebrile.   Respiratory viral panel is negative as well.   Hypokalemia Supplement as indicated.   History of right MCA CVA with left hemiparesis This occurred in August.  Noted to be on aspirin .  MRI brain was repeated and does not show any new findings.   Dehydration Given IV fluids.   History of COPD/emphysema No exacerbation noted.   Essential hypertension Monitor blood pressures closely.   Obstructive sleep apnea CPAP   Moderate protein calorie malnutrition Tube feedings being continued.   Physical deconditioning PT and OT evaluation.  Home health is recommended   Patient stable.  Mentation back to baseline.  Okay for discharge home today.   PERTINENT LABS:  The results of significant diagnostics from this hospitalization (including imaging, microbiology, ancillary and laboratory) are listed below for reference.    Microbiology: Recent Results (from the past 240 hours)  Resp panel by RT-PCR (RSV, Flu A&B, Covid) Anterior Nasal Swab     Status: None   Collection Time: 10/21/24  1:12 PM   Specimen: Anterior Nasal Swab  Result Value Ref Range Status   SARS Coronavirus 2 by RT PCR NEGATIVE NEGATIVE Final   Influenza A by PCR NEGATIVE NEGATIVE Final   Influenza B by PCR NEGATIVE NEGATIVE Final    Comment: (NOTE) The Xpert Xpress SARS-CoV-2/FLU/RSV plus assay is intended as an aid in  the diagnosis of influenza from Nasopharyngeal swab specimens and should not be used as a sole basis for treatment. Nasal washings and aspirates are unacceptable for Xpert Xpress SARS-CoV-2/FLU/RSV testing.  Fact Sheet for  Patients: bloggercourse.com  Fact Sheet for Healthcare Providers: seriousbroker.it  This test is not yet approved or cleared by the [place]  FDA and has been authorized for detection and/or diagnosis of SARS-CoV-2 by FDA under an Emergency Use Authorization (EUA). This EUA will remain in effect (meaning this test can be used) for the duration of the COVID-19 declaration under Section 564(b)(1) of the Act, 21 U.S.C. section 360bbb-3(b)(1), unless the authorization is terminated or revoked.     Resp Syncytial Virus by PCR NEGATIVE NEGATIVE Final    Comment: (NOTE) Fact Sheet for Patients: bloggercourse.com  Fact Sheet for Healthcare Providers: seriousbroker.it  This test is not yet approved or cleared by the [place]  FDA and has been authorized for detection and/or diagnosis of SARS-CoV-2 by FDA under an Emergency Use Authorization (EUA). This EUA will remain in effect (meaning this test can be used) for the duration of the COVID-19 declaration under Section 564(b)(1) of the Act, 21 U.S.C. section 360bbb-3(b)(1), unless the authorization is terminated or revoked.  Performed at [facility], [address] [place], [place] [address]   Respiratory (~20 pathogens) panel by PCR     Status: None   Collection Time: 10/22/24  3:37 AM   Specimen: Nasopharyngeal Swab; Respiratory  Result Value Ref Range Status   Adenovirus NOT DETECTED NOT DETECTED Final   Coronavirus 229E NOT DETECTED NOT DETECTED Final    Comment: (NOTE) The Coronavirus on the Respiratory Panel, DOES NOT test for the novel  Coronavirus (2019 nCoV)    Coronavirus HKU1 NOT DETECTED NOT DETECTED Final   Coronavirus NL63 NOT DETECTED NOT DETECTED Final   Coronavirus OC43 NOT DETECTED NOT DETECTED Final   Metapneumovirus NOT DETECTED NOT DETECTED Final   Rhinovirus / Enterovirus NOT DETECTED NOT  DETECTED Final   Influenza A NOT DETECTED NOT DETECTED Final   Influenza B NOT DETECTED NOT DETECTED Final   Parainfluenza Virus 1 NOT DETECTED NOT DETECTED Final   Parainfluenza Virus 2 NOT DETECTED NOT DETECTED Final   Parainfluenza Virus 3 NOT DETECTED NOT DETECTED Final   Parainfluenza Virus 4 NOT DETECTED NOT DETECTED Final   Respiratory Syncytial Virus NOT DETECTED NOT DETECTED Final   Bordetella pertussis NOT DETECTED NOT DETECTED Final   Bordetella Parapertussis NOT DETECTED NOT DETECTED Final   Chlamydophila pneumoniae NOT DETECTED NOT DETECTED Final   Mycoplasma pneumoniae NOT DETECTED NOT DETECTED Final    Comment: Performed at [facility], [address] [place], [place] [address]     Labs:   Basic Metabolic Panel: Recent Labs  Lab 10/21/24 1249 10/22/24 0327 10/23/24 0103 10/24/24 2030  NA 140 138 138 136  K 3.7 3.3* 3.6 3.9  CL 101 102 104 102  CO2 29 25 23 24   GLUCOSE 106* 95 102* 107*  BUN 15 10 14 16   CREATININE 0.57 0.57 0.47 0.46  CALCIUM  9.1 8.9 8.5* 9.0  MG  --  1.8  --   --    Liver Function Tests: Recent Labs  Lab 10/21/24 1249 10/23/24 0103 10/24/24 2030  AST 19 19 26   ALT 16 16 18   ALKPHOS 87 72 77  BILITOT 0.3 0.3 0.6  PROT 6.3* 6.1* 6.2*  ALBUMIN 3.2* 2.9* 3.3*   Recent Labs  Lab 10/23/24 0103  AMMONIA  15   CBC: Recent Labs  Lab 10/21/24 1249 10/22/24 0327 10/23/24 0103 10/24/24 2030  WBC 10.0 8.5 8.5 10.4  HGB 12.7 12.5 12.1 13.4  HCT 38.4 36.8 36.5 39.8  MCV 95.5 93.6 93.8 93.9  PLT 176 154 170 203   BNP: BNP (last 3 results) Recent Labs    06/24/24 0826 07/09/24 0431  BNP 622.4* 100.5*    CBG: Recent Labs  Lab 10/21/24 1246  GLUCAP 118*     IMAGING STUDIES CT HEAD WO CONTRAST ( ) Result Date: 10/24/2024 EXAM: CT HEAD WITHOUT CONTRAST 10/24/2024 10:06:22 PM TECHNIQUE: CT of the head was performed without the administration of intravenous contrast. Automated exposure control, iterative  reconstruction, and/or weight based adjustment of the mA/kV was utilized to reduce the radiation dose to as low as reasonably achievable. COMPARISON: CT head 10/21/2024 CLINICAL HISTORY: Mental status change, unknown cause. FINDINGS: BRAIN AND VENTRICLES: No acute hemorrhage. No evidence of acute infarct. Large remote right MCA territory infarct. No hydrocephalus. No extra-axial collection. No mass effect or midline shift. ORBITS: No acute abnormality. SINUSES: No acute abnormality. SOFT TISSUES AND SKULL: No acute soft tissue abnormality. No skull fracture. IMPRESSION: 1. No acute intracranial abnormality. 2. Large remote right MCA territory infarct. Electronically signed by: Gilmore Molt MD 10/24/2024 10:12 PM EST RP Workstation: [id number]   DG Chest Port 1 View Result Date: 10/24/2024 EXAM: 1 VIEW(S) XRAY OF THE CHEST 10/24/2024 09:40:17 PM COMPARISON: 10/21/2024 CLINICAL HISTORY: AMS FINDINGS: LUNGS AND PLEURA: No focal pulmonary opacity. No pulmonary edema. No pleural effusion. No pneumothorax. HEART AND MEDIASTINUM: Aortic atherosclerosis. No acute abnormality of the cardiac silhouette. BONES AND SOFT TISSUES: Chronic left posterior fourth through seventh rib fractures. Thoracic spondylosis. IMPRESSION: 1. No acute cardiopulmonary findings. Electronically signed by: Norman Gatlin MD 10/24/2024 09:53 PM EST RP Workstation: [id number]   EEG adult Result Date: 10/22/2024 Shelton Arlin KIDD, MD     10/22/2024  2:55 PM Patient Name: HAROLYN COCKER MRN: [id number] Epilepsy Attending: Arlin KIDD Shelton Referring Physician/Provider: Verdene Purchase, MD Date: 10/22/2024 Duration: 23.04 mins Patient history: 89 year old female with altered mental status.  EEG to evaluate for seizure. Level of alertness: Awake AEDs during EEG study: None Technical aspects: This EEG study was done with scalp electrodes positioned according to the 10-20 International system of electrode placement. Electrical activity was reviewed  with band pass filter of 1-70Hz , sensitivity of 7 uV/mm, display speed of 108mm/sec with a 60Hz  notched filter applied as appropriate. EEG data were recorded continuously and digitally stored.  Video monitoring was available and reviewed as appropriate. Description: The posterior dominant rhythm consists of 8Hz  activity of moderate voltage (25-35 uV) seen predominantly in posterior head regions, symmetric and reactive to eye opening and eye closing. EEG showed continuous 3 to 6 Hz theta-delta slowing in right hemisphere. Hyperventilation and photic stimulation were not performed.   ABNORMALITY - Continuous slow, right hemisphere IMPRESSION: This study is suggestive of cortical dysfunction arising from right hemisphere likely secondary to underlying structural abnormality. No seizures or epileptiform discharges were seen throughout the recording. Arlin KIDD Shelton   MR BRAIN WO CONTRAST Result Date: 10/21/2024 EXAM: MRI BRAIN WITHOUT CONTRAST 10/21/2024 09:50:32 PM TECHNIQUE: Multiplanar multisequence MRI of the head/brain was performed without the administration of intravenous contrast. COMPARISON: CT head earlier today. MRI head 06/26/2024 CLINICAL HISTORY: Mental status change, unknown cause. FINDINGS: BRAIN AND VENTRICLES: No acute infarct. Sequela of prior right MCA territory infarct including encephalomalacia and extensive hemosiderin deposition from prior hemorrhage No acute intracranial  hemorrhage. No mass. No midline shift. No hydrocephalus. Patchy white matter T2 hyperintensities, compatible with chronic microvascular ischemic change. Cerebral atrophy. ORBITS: No acute abnormality. SINUSES AND MASTOIDS: No acute abnormality. BONES AND SOFT TISSUES: Normal marrow signal. No acute soft tissue abnormality. IMPRESSION: 1. No acute intracranial abnormality. 2. Prior right MCA territory infarct. Electronically signed by: Gilmore Molt MD 10/21/2024 10:20 PM EST RP Workstation: [id number]   DG Chest Portable  1 View Result Date: 10/21/2024 CLINICAL DATA:  URI sxs, AMS EXAM: PORTABLE CHEST - 1 VIEW COMPARISON:  10/09/2024 FINDINGS: Lower lung volumes. no focal airspace consolidation, pleural effusion, or pneumothorax. No cardiomegaly. Tortuous aorta with aortic atherosclerosis. No acute fracture or destructive lesions. Multilevel thoracic osteophytosis. IMPRESSION: No acute cardiopulmonary abnormality. Electronically Signed   By: Rogelia Myers M.D.   On: 10/21/2024 14:15   CT Head Wo Contrast Result Date: 10/21/2024 EXAM: CT HEAD WITHOUT CONTRAST 10/21/2024 01:50:00 PM TECHNIQUE: CT of the head was performed without the administration of intravenous contrast. Automated exposure control, iterative reconstruction, and/or weight based adjustment of the mA/kV was utilized to reduce the radiation dose to as low as reasonably achievable. COMPARISON: Head CT 07/29/2024 and MRI 06/26/2024. CLINICAL HISTORY: Mental status change, unknown cause. FINDINGS: BRAIN AND VENTRICLES: There is no evidence of an acute infarct, intracranial hemorrhage, mass, midline shift, hydrocephalus, or extra-axial fluid collection. There has been expected interval evolution of the large right MCA infarct with development of encephalomalacia and ex vacuo dilatation of the right lateral ventricle. There is mild cerebral atrophy. Patchy hypodensities elsewhere in the cerebral white matter bilaterally are nonspecific but compatible with moderate chronic small vessel ischemic disease. Calcified atherosclerosis at the skull base. ORBITS: Bilateral cataract extraction. SINUSES: No acute abnormality. SOFT TISSUES AND SKULL: No acute soft tissue abnormality. No skull fracture. IMPRESSION: 1. No acute intracranial abnormality. 2. Large chronic right MCA infarct. 3. Moderate chronic small vessel ischemic disease. Electronically signed by: Dasie Hamburg MD 10/21/2024 02:12 PM EST RP Workstation: [id number]    DISCHARGE EXAMINATION: Vitals:   10/24/24 0502  10/24/24 0742 10/24/24 0748 10/24/24 0855  BP: (!) 153/67  (!) 155/59   Pulse: 85  87   Resp: 18  18   Temp: 98.3 F (36.8 C)  99.6 F (37.6 C) 99.2 F (37.3 C)  TempSrc: Oral  Oral Oral  SpO2: 98% 97% 97%   Weight:       General appearance: Awake alert.  In no distress Resp: Clear to auscultation bilaterally.  Normal effort Cardio: S1-S2 is normal regular.  No S3-S4.  No rubs murmurs or bruit GI: Abdomen is soft.  Nontender nondistended.  Bowel sounds are present normal.  No masses organomegaly Extremities: No edema.  Full range of motion of lower extremities. Neurologic: Alert and oriented x3.  Chronic left hemiparesis   DISPOSITION: Home with family  Discharge Instructions     Call MD for:  difficulty breathing, headache or visual disturbances   Complete by: As directed    Call MD for:  extreme fatigue   Complete by: As directed    Call MD for:  persistant dizziness or light-headedness   Complete by: As directed    Call MD for:  persistant nausea and vomiting   Complete by: As directed    Call MD for:  severe uncontrolled pain   Complete by: As directed    Call MD for:  temperature >100.4   Complete by: As directed    Discharge instructions   Complete by: As directed  Please take your medications as prescribed.  Please note changes to the medication regimen.  Take the gabapentin  only once a day and only if needed.  Modafinil , baclofen  and lamotrigine  has been discontinued.  You were cared for by a hospitalist during your hospital stay. If you have any questions about your discharge medications or the care you received while you were in the hospital after you are discharged, you can call the unit and asked to speak with the hospitalist on call if the hospitalist that took care of you is not available. Once you are discharged, your primary care physician will handle any further medical issues. Please note that NO REFILLS for any discharge medications will be authorized once  you are discharged, as it is imperative that you return to your primary care physician (or establish a relationship with a primary care physician if you do not have one) for your aftercare needs so that they can reassess your need for medications and monitor your lab values. If you do not have a primary care physician, you can call [phone] for a physician referral.   Increase activity slowly   Complete by: As directed          Allergies as of 10/24/2024       Reactions   Adhesive [tape] Itching, Rash   Ativan  [lorazepam ] Anxiety, Other (See Comments)   Altered mental state   Latex Itching, Rash        Medication List     STOP taking these medications    amoxicillin -clavulanate 250-62.5 MG/5ML suspension Commonly known as: AUGMENTIN    Baclofen  5 MG Tabs   lamoTRIgine  25 MG tablet Commonly known as: LAMICTAL    modafinil  100 MG tablet Commonly known as: PROVIGIL        TAKE these medications    acetaminophen  650 MG CR tablet Commonly known as: TYLENOL  Take 1 tablet (650 mg total) by mouth 3 (three) times daily.   albuterol  108 (90 Base) MCG/ACT inhaler Commonly known as: VENTOLIN  HFA Inhale 2 puffs into the lungs every 6 (six) hours as needed for wheezing or shortness of breath.   albuterol  (2.5 MG/3ML) 0.083% nebulizer solution Commonly known as: PROVENTIL  Take 2.5 mg by nebulization every 6 (six) hours as needed for wheezing or shortness of breath.   arformoterol  15 MCG/2ML Nebu Commonly known as: Brovana  Take 2 mLs (15 mcg total) by nebulization 2 (two) times daily.   aspirin  81 MG chewable tablet Place 81 mg into feeding tube daily.   atorvastatin  10 MG tablet Commonly known as: LIPITOR Take 1 tablet (10 mg total) by mouth at bedtime.   b complex vitamins capsule Place 1 capsule into feeding tube daily.   buPROPion  75 MG tablet Commonly known as: WELLBUTRIN  Take 1 tablet (75 mg total) by mouth 2 (two) times daily.   busPIRone  5 MG  tablet Commonly known as: BUSPAR  Take 1 tablet (5 mg total) by mouth 3 (three) times daily as needed (Anxiety). What changed: when to take this   escitalopram  20 MG tablet Commonly known as: LEXAPRO  Take 2 tablets (40 mg total) by mouth daily. What changed:  how much to take when to take this   famotidine  20 MG tablet Commonly known as: PEPCID  Place 20 mg into feeding tube 2 (two) times daily.   feeding supplement (OSMOLITE 1.5 CAL) Liqd Place 237 mLs into feeding tube 2 (two) times daily. What changed: when to take this   free water  Soln Place 120 mLs into feeding tube 3 (three)  times daily.   gabapentin  300 MG capsule Commonly known as: NEURONTIN  Take 1 capsule (300 mg total) by mouth daily as needed (for neuropathic pain). What changed:  when to take this reasons to take this   Gerhardt's butt cream Crea Apply 1 Application topically 3 (three) times daily. What changed:  when to take this reasons to take this   Geri-kot 8.6 MG tablet Generic drug: senna Take 8.6 mg by mouth daily.   lidocaine  4 % Place 1 patch onto the skin daily as needed (pain).   melatonin 5 MG Tabs Take 1 tablet (5 mg total) by mouth at bedtime.   Multivitamin Women 50+ Tabs Take 1 tablet by mouth daily.   Ocuvite Adult 50+ Caps Give 1 capsule by tube daily.   nitroGLYCERIN  0.4 MG SL tablet Commonly known as: NITROSTAT  Place 1 tablet (0.4 mg total) under the tongue every 5 (five) minutes as needed for chest pain.   polyethylene glycol 17 g packet Commonly known as: MIRALAX  / GLYCOLAX  Place 17 g into feeding tube daily as needed for mild constipation. What changed:  how much to take when to take this   QUEtiapine 25 MG tablet Commonly known as: SEROQUEL Place 1 tablet (25 mg total) into feeding tube at bedtime.   Systane Complete 0.6 % Soln Generic drug: Propylene Glycol Place 1 drop into both eyes daily.   traMADol  50 MG tablet Commonly known as: ULTRAM  Take 0.5-1  tablets (25-50 mg total) by mouth every 4 (four) hours as needed for severe pain (pain score 7-10). What changed:  how much to take when to take this additional instructions   Vitamin D  50 MCG (2000 UT) tablet Take 1 tablet (2,000 Units total) by mouth daily.   Voltaren  Arthritis Pain 1 % Gel Generic drug: diclofenac  Sodium Apply 2 g topically 4 (four) times daily. What changed:  how much to take when to take this reasons to take this   Yupelri  175 MCG/3ML nebulizer solution Generic drug: revefenacin  TAKE 3 MLS (175 MCG TOTAL) BY NEBULIZATION DAILY.          Follow-up Information     [facility] Follow up.   Specialty: Home Health Services Why: [facility] will provide home health services.  They will call you in the next 24-48 hours. Contact information: [address] [place] [address] [phone]                 TOTAL DISCHARGE TIME: 35 minutes  [place] Yepiz Verdene  [facility] Pager on [email or web address]  10/25/2024, 10:36 AM

## 2024-10-24 NOTE — TOC Transition Note (Signed)
 Transition of Care [facility]) - Discharge Note   Patient Details  Name: [name] MRN: [id number] Date of Birth: 1932/07/23  [facility] CM/SW Contact:  Sherline Clack, LCSWA Phone Number: 10/24/2024, 10:52 AM   Clinical Narrative:     Patient will DC to: Home Anticipated DC date: 10/24/24  Family notified: [name]/daughter Transport by: [facility]   Per MD patient ready for DC home. RN, patient, patient's family, and patient notified of DC. DC packet on chart. Ambulance transport requested for patient.   CSW will sign off for now as social work intervention is no longer needed. Please consult us  again if new needs arise.    Final next level of care: Home/Self Care Barriers to Discharge: Barriers Resolved   Patient Goals and CMS Choice            Discharge Placement                  Name of family member notified: [name]/daughter [phone]    Discharge Plan and Services Additional resources added to the After Visit Summary for                  DME Arranged: Tube feeding DME Agency: [employer]                  Social Drivers of Health (SDOH) Interventions SDOH Screenings   Food Insecurity: No Food Insecurity (10/22/2024)  Housing: Low Risk  (10/22/2024)  Transportation Needs: Patient Unable To Answer (10/22/2024)  Utilities: Patient Unable To Answer (10/22/2024)  Depression (PHQ2-9): Medium Risk (09/08/2024)  Social Connections: Patient Unable To Answer (10/22/2024)  Tobacco Use: Medium Risk (10/21/2024)     Readmission Risk Interventions     No data to display

## 2024-10-24 NOTE — ED Triage Notes (Signed)
 Patient bib EMS from home with complaints of hypertension. She was discharged today from the hospital with metabolic encephalopathy. She has been confused for the past week.  Patient has no history of hypertension. Family was concerned when they took her blood pressure and gt 185/117.

## 2024-10-24 NOTE — ED Provider Notes (Signed)
 [place] EMERGENCY DEPARTMENT AT [facility] Provider Note  MDM   HPI/ROS:  [name] is a 89 y.o. female with a PMH of COPD, left hemiparesis s/p CVA, CAP, HTN, malnutrition who presents to the ED for hypertension.  History is primarily obtained from patient as well as patient's daughter at bedside.  They state that she was discharged this morning after a 4-day inpatient stay for acute metabolic encephalopathy.  Her daughter reports that her mentation has improved since her initial admission but she is not quite yet back to baseline.  She states that she felt warm to the touch today and when she took her BP it was elevated at 185/117.  Patient denies any headache, chest pain, shortness of breath, swelling, vision changes.  DDx includes hypertensive emergency, intracranial abnormalities, electrolyte abnormalities, infection  Physical exam is notable for: - Patient in no acute distress, alert and oriented x 3, left hemiparesis (at patient's baseline) -- Lungs CTAB, no wheezes, no rhonchi or crackles -- Abdomen is soft, nontender, nondistended  On my initial evaluation, patient is:  -Vital signs significant for hypertension 175/80. Patient afebrile, hemodynamically stable, and non-toxic appearing.  CT head without any acute abnormalities, CXR without any evidence of acute cardiopulmonary abnormality.  CMP and CBC without any evidence of endorgan dysfunction.  EKG with sinus rhythm with intermittent PVCs, no acute ischemia.    This patient's current presentation, including their history and physical exam, is most consistent with new onset hypertension.  Disc with patient as well as daughter at bedside initiation of antihypertensives with losartan 25 mg.  She was instructed to follow-up with her PCP within the next week, and to hold the medicine if her systolic pressures were below 899.  They voiced understanding..    Disposition:  I discussed the plan for discharge with the  patient and/or their surrogate at bedside prior to discharge and they were in agreement with the plan and verbalized understanding of the return precautions provided. All questions answered to the best of my ability. Ultimately, the patient was discharged in stable condition with stable vital signs. I am reassured that they are capable of close follow up and good social support at home.   Clinical Impression: No diagnosis found.  Rx / DC Orders ED Discharge Orders     None      Clinical Complexity A medically appropriate history, review of systems, and physical exam was performed.  My independent interpretations of EKG, labs, and radiology are documented in the ED course above.   If decision rules were used in this patient's evaluation, they are listed below.   Click here for ABCD2, HEART and other calculatorsREFRESH Note before signing   Patient's presentation is most consistent with acute complicated illness / injury requiring diagnostic workup.  Medical Decision Making Amount and/or Complexity of Data Reviewed Labs: ordered. Radiology: ordered.  Risk Prescription drug management.    HPI/ROS      See MDM section for pertinent HPI and ROS. A complete ROS was performed with pertinent positives/negatives noted above.   Past Medical History:  Diagnosis Date   Anxiety    Back pain    Cancer (HCC)    Depression    Emphysema    Esophageal spasm    Lung nodule    OSA (obstructive sleep apnea)    Osteomalacia    Personal history of radiation therapy    Stroke [facility])    Vertigo    Vitamin D  deficiency  Past Surgical History:  Procedure Laterality Date   ABDOMINAL HYSTERECTOMY     APPENDECTOMY     BREAST BIOPSY Right 08/31/2020   BREAST EXCISIONAL BIOPSY Left 1978   BREAST LUMPECTOMY Right 09/30/2020   BREAST LUMPECTOMY WITH RADIOACTIVE SEED LOCALIZATION Right 09/30/2020   Procedure: RIGHT BREAST LUMPECTOMY WITH RADIOACTIVE SEED LOCALIZATION;  Surgeon: Curvin Deward MOULD, MD;  Location: [facility];  Service: General;  Laterality: Right;   CARPAL TUNNEL RELEASE     IR CT HEAD LTD  06/25/2024   IR GASTROSTOMY TUBE MOD SED  07/14/2024   IR PERCUTANEOUS ART THROMBECTOMY/INFUSION INTRACRANIAL INC DIAG ANGIO  06/25/2024   RADIOLOGY WITH ANESTHESIA N/A 06/25/2024   Procedure: RADIOLOGY WITH ANESTHESIA;  Surgeon: Dolphus Carrion, MD;  Location: [facility] OR;  Service: Radiology;  Laterality: N/A;   ROTATOR CUFF REPAIR        Physical Exam   Vitals:   10/24/24 2012 10/24/24 2020 10/24/24 2023  BP:  (!) 175/80   Pulse:  87   Resp:  20   Temp:  (!) 97 F (36.1 C)   TempSrc:  Temporal   SpO2: 96% 97%   Weight:   61.2 kg  Height:   5' 1 (1.549 m)    Physical Exam Vitals and nursing note reviewed.  Constitutional:      General: She is not in acute distress.    Appearance: She is well-developed.  HENT:     Head: Normocephalic and atraumatic.  Eyes:     Conjunctiva/sclera: Conjunctivae normal.  Cardiovascular:     Rate and Rhythm: Normal rate and regular rhythm.     Heart sounds: No murmur heard. Pulmonary:     Effort: Pulmonary effort is normal. No respiratory distress.     Breath sounds: Normal breath sounds.  Abdominal:     Palpations: Abdomen is soft.     Tenderness: There is no abdominal tenderness.  Musculoskeletal:        General: No swelling.     Cervical back: Neck supple.  Skin:    General: Skin is warm and dry.     Capillary Refill: Capillary refill takes less than 2 seconds.  Neurological:     Mental Status: She is alert. Mental status is at baseline.     Comments: Left hemiparesis  Psychiatric:        Mood and Affect: Mood normal.      Procedures   If procedures were preformed on this patient, they are listed below:  Procedures  Please note that this documentation was produced with the assistance of voice-to-text technology and may contain errors.     Billy Pal, MD 10/24/24 2303    Patt Alm Macho,  MD 10/24/24 [phone]

## 2024-10-24 NOTE — ED Notes (Signed)
 PTAR called

## 2024-10-24 NOTE — Plan of Care (Signed)
 Patient's family in room at bedside, medications tolerated well. G tube maintained. Patient left with call bell in reach and bed in lowest position.  Problem: Education: Goal: Knowledge of General Education information will improve Description: Including pain rating scale, medication(s)/side effects and non-pharmacologic comfort measures Outcome: Progressing   Problem: Health Behavior/Discharge Planning: Goal: Ability to manage health-related needs will improve Outcome: Progressing   Problem: Clinical Measurements: Goal: Ability to maintain clinical measurements within normal limits will improve Outcome: Progressing   Problem: Activity: Goal: Risk for activity intolerance will decrease Outcome: Progressing   Problem: Nutrition: Goal: Adequate nutrition will be maintained Outcome: Progressing   Problem: Coping: Goal: Level of anxiety will decrease Outcome: Progressing   Problem: Safety: Goal: Ability to remain free from injury will improve Outcome: Progressing

## 2024-10-24 NOTE — Discharge Instructions (Signed)
 [name]:  Thank you for allowing us  to take care of you today.  We hope you begin feeling better soon. You were seen today for new onset high blood pressure. While you were here we performed physical examination, lab work and CT imaging. There was no emergent cause of your symptoms found today.  While you were here we prescribed you losartan 25 mg for the next 30 days. Please follow-up with your PCP within the next week for BP checks.  To-Do:  Please follow-up with your primary doctor within the next 2-3 days. It is important that you review any labs or imaging results (if any) that you had today with them. Your preliminary imaging results (if any) are attached. Please return to the Emergency Department or call 911 if you experience chest pain, shortness of breath, severe pain, severe fever, altered mental status, or have any reason to think that you need emergency medical care.  Thank you again.  Hope you feel better soon.  Department of Emergency Medicine

## 2024-10-24 NOTE — Telephone Encounter (Signed)
 Pharmacy Patient Advocate Encounter  Received notification from [employer] that Prior Authorization for Quetiapine 25mg  tablets has been APPROVED from 10/24/24 to 12/17/25. Ran test claim, Copay is $1.25. This test claim was processed through [facility]- copay amounts may vary at other pharmacies due to pharmacy/plan contracts, or as the patient moves through the different stages of their insurance plan.   PA #/Case ID/Reference #: [id number]

## 2024-10-25 DIAGNOSIS — E440 Moderate protein-calorie malnutrition: Secondary | ICD-10-CM | POA: Diagnosis not present

## 2024-10-25 DIAGNOSIS — E1151 Type 2 diabetes mellitus with diabetic peripheral angiopathy without gangrene: Secondary | ICD-10-CM | POA: Diagnosis not present

## 2024-10-25 DIAGNOSIS — I10 Essential (primary) hypertension: Secondary | ICD-10-CM | POA: Diagnosis not present

## 2024-10-25 DIAGNOSIS — Z743 Need for continuous supervision: Secondary | ICD-10-CM | POA: Diagnosis not present

## 2024-10-25 DIAGNOSIS — F329 Major depressive disorder, single episode, unspecified: Secondary | ICD-10-CM | POA: Diagnosis not present

## 2024-10-25 DIAGNOSIS — J439 Emphysema, unspecified: Secondary | ICD-10-CM | POA: Diagnosis not present

## 2024-10-25 DIAGNOSIS — I69391 Dysphagia following cerebral infarction: Secondary | ICD-10-CM | POA: Diagnosis not present

## 2024-10-25 DIAGNOSIS — E559 Vitamin D deficiency, unspecified: Secondary | ICD-10-CM | POA: Diagnosis not present

## 2024-10-25 DIAGNOSIS — I69354 Hemiplegia and hemiparesis following cerebral infarction affecting left non-dominant side: Secondary | ICD-10-CM | POA: Diagnosis not present

## 2024-10-25 DIAGNOSIS — R1312 Dysphagia, oropharyngeal phase: Secondary | ICD-10-CM | POA: Diagnosis not present

## 2024-10-25 DIAGNOSIS — F419 Anxiety disorder, unspecified: Secondary | ICD-10-CM | POA: Diagnosis not present

## 2024-10-25 DIAGNOSIS — G8190 Hemiplegia, unspecified affecting unspecified side: Secondary | ICD-10-CM | POA: Diagnosis not present

## 2024-10-25 NOTE — ED Notes (Signed)
 Assuming pt care, pt waiting on ptar home, no active orders/treatment plan at this time, daughter at bedside

## 2024-10-28 DIAGNOSIS — Z931 Gastrostomy status: Secondary | ICD-10-CM | POA: Diagnosis not present

## 2024-10-28 DIAGNOSIS — G8194 Hemiplegia, unspecified affecting left nondominant side: Secondary | ICD-10-CM | POA: Diagnosis not present

## 2024-10-28 DIAGNOSIS — J449 Chronic obstructive pulmonary disease, unspecified: Secondary | ICD-10-CM | POA: Diagnosis not present

## 2024-10-28 DIAGNOSIS — I639 Cerebral infarction, unspecified: Secondary | ICD-10-CM | POA: Diagnosis not present

## 2024-10-31 DIAGNOSIS — J449 Chronic obstructive pulmonary disease, unspecified: Secondary | ICD-10-CM | POA: Diagnosis not present

## 2024-10-31 DIAGNOSIS — I63511 Cerebral infarction due to unspecified occlusion or stenosis of right middle cerebral artery: Secondary | ICD-10-CM | POA: Diagnosis not present

## 2024-10-31 DIAGNOSIS — R1310 Dysphagia, unspecified: Secondary | ICD-10-CM | POA: Diagnosis not present

## 2024-11-06 DIAGNOSIS — M62838 Other muscle spasm: Secondary | ICD-10-CM | POA: Diagnosis not present

## 2024-11-06 DIAGNOSIS — I10 Essential (primary) hypertension: Secondary | ICD-10-CM | POA: Diagnosis not present

## 2024-11-06 DIAGNOSIS — R451 Restlessness and agitation: Secondary | ICD-10-CM | POA: Diagnosis not present

## 2024-11-06 DIAGNOSIS — F331 Major depressive disorder, recurrent, moderate: Secondary | ICD-10-CM | POA: Diagnosis not present

## 2024-11-06 DIAGNOSIS — Z8673 Personal history of transient ischemic attack (TIA), and cerebral infarction without residual deficits: Secondary | ICD-10-CM | POA: Diagnosis not present

## 2024-11-06 DIAGNOSIS — G9341 Metabolic encephalopathy: Secondary | ICD-10-CM | POA: Diagnosis not present

## 2024-11-19 DIAGNOSIS — L814 Other melanin hyperpigmentation: Secondary | ICD-10-CM | POA: Diagnosis not present

## 2024-11-19 DIAGNOSIS — L821 Other seborrheic keratosis: Secondary | ICD-10-CM | POA: Diagnosis not present

## 2024-11-19 DIAGNOSIS — L304 Erythema intertrigo: Secondary | ICD-10-CM | POA: Diagnosis not present

## 2024-11-19 DIAGNOSIS — D3617 Benign neoplasm of peripheral nerves and autonomic nervous system of trunk, unspecified: Secondary | ICD-10-CM | POA: Diagnosis not present

## 2024-11-19 DIAGNOSIS — C44311 Basal cell carcinoma of skin of nose: Secondary | ICD-10-CM | POA: Diagnosis not present

## 2024-11-19 DIAGNOSIS — Z85828 Personal history of other malignant neoplasm of skin: Secondary | ICD-10-CM | POA: Diagnosis not present

## 2024-11-21 ENCOUNTER — Telehealth: Payer: Self-pay | Admitting: *Deleted

## 2024-11-21 NOTE — Telephone Encounter (Signed)
 [name] was discharged with a g tube with no information who is managing this tube. Please advise.

## 2024-11-22 ENCOUNTER — Other Ambulatory Visit: Payer: Self-pay

## 2024-11-22 ENCOUNTER — Emergency Department (HOSPITAL_COMMUNITY)

## 2024-11-22 ENCOUNTER — Encounter (HOSPITAL_COMMUNITY): Payer: Self-pay

## 2024-11-22 ENCOUNTER — Emergency Department (HOSPITAL_COMMUNITY)
Admission: EM | Admit: 2024-11-22 | Discharge: 2024-11-22 | Disposition: A | Discharge status: Home / Self Care | Attending: Emergency Medicine | Admitting: Emergency Medicine

## 2024-11-22 DIAGNOSIS — Z931 Gastrostomy status: Secondary | ICD-10-CM

## 2024-11-22 DIAGNOSIS — M954 Acquired deformity of chest and rib: Secondary | ICD-10-CM

## 2024-11-22 DIAGNOSIS — K9423 Gastrostomy malfunction: Secondary | ICD-10-CM | POA: Diagnosis not present

## 2024-11-22 DIAGNOSIS — T85698A Other mechanical complication of other specified internal prosthetic devices, implants and grafts, initial encounter: Secondary | ICD-10-CM | POA: Diagnosis not present

## 2024-11-22 DIAGNOSIS — Z7982 Long term (current) use of aspirin: Secondary | ICD-10-CM | POA: Diagnosis not present

## 2024-11-22 MED ORDER — IOHEXOL 300 MG/ML  SOLN
30.0000 mL | Freq: Once | INTRAMUSCULAR | Status: AC | PRN
  Administered 2024-11-22: 30 mL

## 2024-11-22 NOTE — Discharge Instructions (Addendum)
 You were seen in the ER today for replacement of your G-tube. We were able to place a smaller tube, please let your primary care provider know. For your chest wall deformity, I think this is likely from your previous rib fractures, but please follow up with your PCP. If you have any concerns, new or worsening symptoms, please return to the ER for re-evaluation.   Contact a health care provider if: You have a fever or chills. You see any of these signs on the skin near your insertion site: Redness. Irritation. Swelling. Drainage. Growth of extra tissue. You have pain in your belly. There's blood, fluid, or discharge around your G-tube. Your new tube is not working. Your tube comes out.

## 2024-11-22 NOTE — ED Provider Notes (Signed)
 [place] EMERGENCY DEPARTMENT AT [facility] Provider Note   CSN: [id number] Arrival date & time: 11/22/24  1551     Patient presents with: [name]    TAQWA DEEM is a 89 y.o. female with h/o stroke and now using G-tube for feeding and medications presents to the ER from home for evaluation after G-tube tubing broke in half. [name] had all the components. This happened around 4 hours prior to arrival. Additionally, he daughter reports that she has had this deformity to her chest wall for months and months. She has talked to the patient's PCP about this, but would like a CXR. Patient denies any chest pain or SOB. Only have pain when rolling over to the side to that area, again has been for months.   HPI     Prior to Admission medications   Medication Sig Start Date End Date Taking? Authorizing Provider  acetaminophen  (TYLENOL ) 650 MG CR tablet Take 1 tablet (650 mg total) by mouth 3 (three) times daily. 07/31/24   Love, Sharlet RAMAN, PA-C  albuterol  (PROVENTIL  HFA;VENTOLIN  HFA) 108 (90 Base) MCG/ACT inhaler Inhale 2 puffs into the lungs every 6 (six) hours as needed for wheezing or shortness of breath.    [provider]  albuterol  (PROVENTIL ) (2.5 MG/3ML) 0.083% nebulizer solution Take 2.5 mg by nebulization every 6 (six) hours as needed for wheezing or shortness of breath.    [provider]  arformoterol  (BROVANA ) 15 MCG/2ML NEBU Take 2 mLs (15 mcg total) by nebulization 2 (two) times daily. 08/07/24   Raulkar, Sven SQUIBB, MD  aspirin  81 MG chewable tablet Place 81 mg into feeding tube daily.    [provider]  atorvastatin  (LIPITOR) 10 MG tablet Take 1 tablet (10 mg total) by mouth at bedtime. 07/31/24   Maurice Sharlet RAMAN, PA-C  b complex vitamins capsule Place 1 capsule into feeding tube daily.    [provider]  buPROPion  (WELLBUTRIN ) 75 MG tablet Take 1 tablet (75 mg total) by mouth 2 (two) times daily. 07/31/24   Love, Sharlet RAMAN, PA-C  busPIRone  (BUSPAR ) 5 MG tablet Take 1 tablet (5 mg total) by mouth 3 (three) times daily as needed (Anxiety). Patient taking differently: Take 5 mg by mouth 3 (three) times daily. 07/31/24   Love, Sharlet RAMAN, PA-C  Cholecalciferol  (VITAMIN D ) 50 MCG (2000 UT) tablet Take 1 tablet (2,000 Units total) by mouth daily. 07/31/24   Love, Sharlet RAMAN, PA-C  diclofenac  Sodium (VOLTAREN ) 1 % GEL Apply 2 g topically 4 (four) times daily. Patient taking differently: Apply 1 Application topically 4 (four) times daily as needed (pain). 07/31/24   Love, Sharlet RAMAN, PA-C  escitalopram  (LEXAPRO ) 20 MG tablet Take 2 tablets (40 mg total) by mouth daily. Patient taking differently: Take 20 mg by mouth in the morning and at bedtime. 07/31/24   Love, Sharlet RAMAN, PA-C  famotidine  (PEPCID ) 20 MG tablet Place 20 mg into feeding tube 2 (two) times daily.    [provider]  gabapentin  (NEURONTIN ) 300 MG capsule Take 1 capsule (300 mg total) by mouth daily as needed (for neuropathic pain). 10/24/24   Verdene Purchase, MD  lidocaine  4 % Place 1 patch onto the skin daily as needed (pain).    [provider]  losartan  (COZAAR ) 25 MG tablet Take 1 tablet (25 mg total) by mouth daily. 10/24/24   Patt Alm Macho, MD  melatonin 5 MG TABS Take 1 tablet (5 mg total) by mouth  at bedtime. 07/31/24   Love, Sharlet RAMAN, PA-C  Multiple Vitamins-Minerals (MULTIVITAMIN WOMEN 50+) TABS Take 1 tablet by mouth daily.    [provider]  Multiple Vitamins-Minerals (OCUVITE ADULT 50+) CAPS Give 1 capsule by tube daily.    [provider]  nitroGLYCERIN  (NITROSTAT ) 0.4 MG SL tablet Place 1 tablet (0.4 mg total) under the tongue every 5 (five) minutes as needed for chest pain. 07/08/24   de Clint Kill, Cortney E, NP  Nutritional Supplements (FEEDING SUPPLEMENT, OSMOLITE 1.5 CAL,) LIQD Place 237 mLs into feeding tube 2 (two) times daily. Patient taking differently: Place 237 mLs into feeding tube 4 (four) times daily.  07/31/24   Love, Sharlet RAMAN, PA-C  Nystatin (GERHARDT'S BUTT CREAM) CREA Apply 1 Application topically 3 (three) times daily. Patient taking differently: Apply 1 Application topically as needed (peri-care). 07/08/24   de Clint Kill, Cortney E, NP  polyethylene glycol (MIRALAX  / GLYCOLAX ) 17 g packet Place 17 g into feeding tube daily as needed for mild constipation. Patient taking differently: Place 8.5 g into feeding tube at bedtime. 07/08/24   de Clint Kill, Cortney E, NP  Propylene Glycol (SYSTANE COMPLETE) 0.6 % SOLN Place 1 drop into both eyes daily.    [provider]  QUEtiapine  (SEROQUEL ) 25 MG tablet Place 1 tablet (25 mg total) into feeding tube at bedtime. 10/24/24   Krishnan, Gokul, MD  senna (GERI-KOT) 8.6 MG tablet Take 8.6 mg by mouth daily.    [provider]  traMADol  (ULTRAM ) 50 MG tablet Take 0.5-1 tablets (25-50 mg total) by mouth every 4 (four) hours as needed for severe pain (pain score 7-10). Patient taking differently: Take 50 mg by mouth See admin instructions. Give 1 tablet (50mg ) by mouth every night. May give an additional 1 tablet during the day if needed for severe pain. 07/31/24   Love, Sharlet RAMAN, PA-C  Water  For Irrigation, Sterile (FREE WATER ) SOLN Place 120 mLs into feeding tube 3 (three) times daily. 07/31/24   Love, Sharlet RAMAN, PA-C  YUPELRI  175 MCG/3ML nebulizer solution TAKE 3 MLS (175 MCG TOTAL) BY NEBULIZATION DAILY. 10/08/24   Raulkar, Sven SQUIBB, MD    Allergies: Adhesive [tape], Ativan  [lorazepam ], and Latex    Review of Systems  Constitutional:  Negative for chills and fever.  Gastrointestinal:  Negative for abdominal pain.    Updated Vital Signs BP (!) 142/56 (BP Location: Right Arm)   Pulse 61   Temp 98.2 F (36.8 C) (Oral)   Resp 18   SpO2 95%   Physical Exam Vitals and nursing note reviewed.  Constitutional:      Appearance: She is not toxic-appearing.     Comments: Chronically ill appearing  Eyes:     General: No scleral  icterus. Pulmonary:     Effort: Pulmonary effort is normal. No respiratory distress.  Abdominal:     Palpations: Abdomen is soft.      Comments: Stoma present at the marked area above. Abdomen soft and non tender.   Skin:    General: Skin is warm and dry.  Neurological:     Mental Status: She is alert.     (all labs ordered are listed, but only abnormal results are displayed) Labs Reviewed - No data to display  EKG: None  Radiology: DG Chest 2 View Result Date: 11/22/2024 CLINICAL DATA:  Right chest bony abnormality, left-sided weakness EXAM: CHEST - 2 VIEW COMPARISON:  10/24/2024 FINDINGS: Frontal and lateral views of the chest demonstrate an unremarkable  cardiac silhouette. Stable aortic atherosclerosis. No acute airspace disease, effusion, or pneumothorax. No acute bony abnormalities. Multiple prior healed left rib fractures. IMPRESSION: 1. Stable chest, no acute process. Electronically Signed   By: Ozell Daring M.D.   On: 11/22/2024 19:30   DG Abdomen PEG Tube Location Result Date: 11/22/2024 CLINICAL DATA:  Peg tube replacement EXAM: ABDOMEN - 1 VIEW COMPARISON:  07/14/2024 FINDINGS: Supine frontal view of the abdomen excludes the right flank and lower pelvis by collimation. Percutaneous gastrostomy tube is identified within the gastric lumen. Contrast injected via the gastrostomy tube outlines the gastric rugal folds and proximal small bowel. No evidence of contrast extravasation. No bowel obstruction or ileus. IMPRESSION: 1. Percutaneous gastrostomy tube within the gastric lumen. Electronically Signed   By: Ozell Daring M.D.   On: 11/22/2024 19:30    .Gastrostomy tube replacement  Date/Time: 11/22/2024 5:50 PM  Performed by: Bernis Ernst, PA-C Authorized by: Bernis Ernst, PA-C  Consent: Verbal consent obtained Risks and benefits: risks, benefits and alternatives were discussed Consent given by: patient and guardian Patient understanding: patient states understanding of  the procedure being performed Patient identity confirmed: verbally with patient Comments: Patient had existing 16Fr however it had been open for 4 hours. I was not able to place a 16Fr given this. A 14Fr was placed without difficulty. 4cc of saline was in the balloon.      Medications Ordered in the ED  iohexol  (OMNIPAQUE ) 300 MG/ML solution 30 mL (30 mLs Per Tube Contrast Given 11/22/24 1918)    Medical Decision Making Amount and/or Complexity of Data Reviewed Radiology: ordered.  Risk Prescription drug management.   89 y.o. female presents to the ER for evaluation of accidental g-tube removal. Differential diagnosis includes but is not limited to g-tube placement versus IR placement. Vital signs mildly elevated BP, otherwise unremarkable. Physical exam as noted above.   I was able to place a 12 French Foley catheter into the patient's stoma to keep the stoma patent.  Was unable to place a 54 French even.  Eventually, did get a 29 French G-tube that was placed.  X-ray was ordered to confirm.  CXR shows Multiple prior healed left rib fractures. Stable chest, no acute process.  DG PEG tube placement shows 1. Percutaneous gastrostomy tube within the gastric lumen.  G-tube is in correct placement.  I do not feel additional workup needs to be done of the patient's chest wall deformity emergently.  She is not having this for months and months and the daughter was more concerned that the chest wall looked asymmetric.  Patient's not having any chest pain or shortness of breath.  Do not think any cardiac workup is needed.  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  I discussed with the patient's daughter about following up with PCP letting them know that a smaller G-tube was placed.  We discussed the results of the labs/imaging. The plan is follow up with PCP. We discussed strict return precautions and red flag symptoms. The family verbalized  their understanding and agrees to the plan. The patient is stable and being discharged home in good condition.  Portions of this report may have been transcribed using voice recognition software. Every effort was made to ensure accuracy; however, inadvertent computerized transcription errors may be present.    Final diagnoses:  Gastrointestinal tube present [facility])  Chest wall deformity    ED Discharge Orders     None  Bernis Ernst, PA-C 11/22/24 SCARLETT Randol Simmonds, MD 11/23/24 1045

## 2024-11-22 NOTE — ED Triage Notes (Addendum)
 Pt BIB EMS, pt states that her gtube came out. Pts last feed was at 0900. Pt has left sided weakness from previous stroke, nonambulatory.

## 2024-11-23 ENCOUNTER — Other Ambulatory Visit: Payer: Self-pay

## 2024-11-23 ENCOUNTER — Observation Stay (HOSPITAL_COMMUNITY)
Admission: EM | Admit: 2024-11-23 | Discharge: 2024-11-24 | Disposition: A | Discharge status: Home / Self Care | Attending: Emergency Medicine | Admitting: Emergency Medicine

## 2024-11-23 ENCOUNTER — Encounter (HOSPITAL_COMMUNITY): Payer: Self-pay | Admitting: *Deleted

## 2024-11-23 DIAGNOSIS — I69391 Dysphagia following cerebral infarction: Secondary | ICD-10-CM | POA: Diagnosis not present

## 2024-11-23 DIAGNOSIS — G4733 Obstructive sleep apnea (adult) (pediatric): Secondary | ICD-10-CM | POA: Diagnosis not present

## 2024-11-23 DIAGNOSIS — J439 Emphysema, unspecified: Secondary | ICD-10-CM

## 2024-11-23 DIAGNOSIS — T85528A Displacement of other gastrointestinal prosthetic devices, implants and grafts, initial encounter: Principal | ICD-10-CM

## 2024-11-23 DIAGNOSIS — F419 Anxiety disorder, unspecified: Secondary | ICD-10-CM | POA: Diagnosis not present

## 2024-11-23 DIAGNOSIS — J449 Chronic obstructive pulmonary disease, unspecified: Secondary | ICD-10-CM | POA: Diagnosis present

## 2024-11-23 DIAGNOSIS — E785 Hyperlipidemia, unspecified: Secondary | ICD-10-CM | POA: Diagnosis not present

## 2024-11-23 DIAGNOSIS — Z931 Gastrostomy status: Secondary | ICD-10-CM | POA: Diagnosis not present

## 2024-11-23 DIAGNOSIS — I69354 Hemiplegia and hemiparesis following cerebral infarction affecting left non-dominant side: Secondary | ICD-10-CM | POA: Diagnosis not present

## 2024-11-23 DIAGNOSIS — F32A Depression, unspecified: Secondary | ICD-10-CM | POA: Diagnosis present

## 2024-11-23 LAB — BASIC METABOLIC PANEL WITH GFR
Anion gap: 9 (ref 5–15)
BUN: 12 mg/dL (ref 8–23)
CO2: 27 mmol/L (ref 22–32)
Calcium: 9.2 mg/dL (ref 8.9–10.3)
Chloride: 106 mmol/L (ref 98–111)
Creatinine, Ser: 0.58 mg/dL (ref 0.44–1.00)
GFR, Estimated: >60 mL/min (ref >60)
Glucose, Bld: 88 mg/dL (ref 70–99)
Potassium: 4.4 mmol/L (ref 3.5–5.1)
Sodium: 142 mmol/L (ref 135–145)

## 2024-11-23 LAB — CBC WITH DIFFERENTIAL/PLATELET
Abs Immature Granulocytes: 0.03 K/uL (ref 0.00–0.07)
Basophils Absolute: 0 K/uL (ref 0.0–0.1)
Basophils Relative: 0 %
Eosinophils Absolute: 0.3 K/uL (ref 0.0–0.5)
Eosinophils Relative: 4 %
HCT: 39 % (ref 36.0–46.0)
Hemoglobin: 12.8 g/dL (ref 12.0–15.0)
Immature Granulocytes: 0 %
Lymphocytes Relative: 15 %
Lymphs Abs: 1.1 K/uL (ref 0.7–4.0)
MCH: 31.4 pg (ref 26.0–34.0)
MCHC: 32.8 g/dL (ref 30.0–36.0)
MCV: 95.6 fL (ref 80.0–100.0)
Monocytes Absolute: 0.6 K/uL (ref 0.1–1.0)
Monocytes Relative: 9 %
Neutro Abs: 5.1 K/uL (ref 1.7–7.7)
Neutrophils Relative %: 72 %
Platelets: 155 K/uL (ref 150–400)
RBC: 4.08 MIL/uL (ref 3.87–5.11)
RDW: 13.2 % (ref 11.5–15.5)
WBC: 7.2 K/uL (ref 4.0–10.5)
nRBC: 0 % (ref 0.0–0.2)

## 2024-11-23 LAB — CBG MONITORING, ED: Glucose-Capillary: 83 mg/dL (ref 70–99)

## 2024-11-23 MED ORDER — SODIUM CHLORIDE 0.9% FLUSH
3.0000 mL | Freq: Two times a day (BID) | INTRAVENOUS | Status: DC
  Administered 2024-11-23 – 2024-11-24 (×3): 3 mL via INTRAVENOUS

## 2024-11-23 MED ORDER — ARFORMOTEROL TARTRATE 15 MCG/2ML IN NEBU
15.0000 ug | INHALATION_SOLUTION | Freq: Two times a day (BID) | RESPIRATORY_TRACT | Status: DC
  Administered 2024-11-23 – 2024-11-24 (×2): 15 ug via RESPIRATORY_TRACT
  Filled 2024-11-23 (×2): qty 2

## 2024-11-23 MED ORDER — BUSPIRONE HCL 10 MG PO TABS
5.0000 mg | ORAL_TABLET | Freq: Three times a day (TID) | ORAL | Status: DC | PRN
  Administered 2024-11-23: 5 mg via ORAL
  Filled 2024-11-23: qty 1

## 2024-11-23 MED ORDER — ASPIRIN 81 MG PO CHEW
81.0000 mg | CHEWABLE_TABLET | Freq: Every day | ORAL | Status: DC
  Administered 2024-11-23 – 2024-11-24 (×2): 81 mg
  Filled 2024-11-23 (×2): qty 1

## 2024-11-23 MED ORDER — GABAPENTIN 300 MG PO CAPS
300.0000 mg | ORAL_CAPSULE | Freq: Two times a day (BID) | ORAL | Status: DC
  Administered 2024-11-23 – 2024-11-24 (×2): 300 mg via ORAL
  Filled 2024-11-23 (×2): qty 1

## 2024-11-23 MED ORDER — ENOXAPARIN SODIUM 40 MG/0.4ML IJ SOSY
40.0000 mg | PREFILLED_SYRINGE | INTRAMUSCULAR | Status: DC
  Administered 2024-11-23: 40 mg via SUBCUTANEOUS
  Filled 2024-11-23: qty 0.4

## 2024-11-23 MED ORDER — MELATONIN 5 MG PO TABS
5.0000 mg | ORAL_TABLET | Freq: Every day | ORAL | Status: DC
  Administered 2024-11-23: 5 mg via ORAL
  Filled 2024-11-23: qty 1

## 2024-11-23 MED ORDER — ACETAMINOPHEN 325 MG PO TABS: 650.0000 mg | ORAL_TABLET | Freq: Four times a day (QID) | ORAL | Status: DC | PRN

## 2024-11-23 MED ORDER — LOSARTAN POTASSIUM 50 MG PO TABS
25.0000 mg | ORAL_TABLET | Freq: Every day | ORAL | Status: DC
  Administered 2024-11-23 – 2024-11-24 (×2): 25 mg via ORAL
  Filled 2024-11-23 (×2): qty 1

## 2024-11-23 MED ORDER — BUPROPION HCL 75 MG PO TABS
75.0000 mg | ORAL_TABLET | Freq: Two times a day (BID) | ORAL | Status: DC
  Administered 2024-11-23 – 2024-11-24 (×2): 75 mg via ORAL
  Filled 2024-11-23 (×3): qty 1

## 2024-11-23 MED ORDER — FOOD THICKENER (SIMPLYTHICK): 1.0000 | ORAL | Status: DC | PRN

## 2024-11-23 MED ORDER — SODIUM CHLORIDE 0.9 % IV SOLN: INTRAVENOUS | Status: DC

## 2024-11-23 MED ORDER — QUETIAPINE FUMARATE 25 MG PO TABS
50.0000 mg | ORAL_TABLET | Freq: Every day | ORAL | Status: DC
  Administered 2024-11-23: 50 mg via ORAL
  Filled 2024-11-23: qty 2

## 2024-11-23 MED ORDER — REVEFENACIN 175 MCG/3ML IN SOLN
175.0000 ug | Freq: Every day | RESPIRATORY_TRACT | Status: DC
  Administered 2024-11-24: 175 ug via RESPIRATORY_TRACT
  Filled 2024-11-23 (×3): qty 3

## 2024-11-23 MED ORDER — ACETAMINOPHEN 650 MG RE SUPP: 650.0000 mg | Freq: Four times a day (QID) | RECTAL | Status: DC | PRN

## 2024-11-23 MED ORDER — ESCITALOPRAM OXALATE 10 MG PO TABS
20.0000 mg | ORAL_TABLET | Freq: Two times a day (BID) | ORAL | Status: DC
  Administered 2024-11-23 – 2024-11-24 (×2): 20 mg via ORAL
  Filled 2024-11-23 (×2): qty 2

## 2024-11-23 MED ORDER — ATORVASTATIN CALCIUM 10 MG PO TABS
10.0000 mg | ORAL_TABLET | Freq: Every day | ORAL | Status: DC
  Administered 2024-11-23: 10 mg via ORAL
  Filled 2024-11-23: qty 1

## 2024-11-23 MED ORDER — SODIUM CHLORIDE 0.9 % IV BOLUS: 1000.0000 mL | Freq: Once | INTRAVENOUS | Status: DC

## 2024-11-23 MED ORDER — FAMOTIDINE 20 MG PO TABS
20.0000 mg | ORAL_TABLET | Freq: Two times a day (BID) | ORAL | Status: DC
  Administered 2024-11-23 – 2024-11-24 (×2): 20 mg via ORAL
  Filled 2024-11-23 (×2): qty 1

## 2024-11-23 MED ORDER — ALBUTEROL SULFATE (2.5 MG/3ML) 0.083% IN NEBU: 2.5000 mg | INHALATION_SOLUTION | Freq: Four times a day (QID) | RESPIRATORY_TRACT | Status: DC | PRN

## 2024-11-23 NOTE — ED Triage Notes (Signed)
 Patient presents to ed via [facility] from home, states she was here yes after her feeding tube came out , was replaced and discharged home, during the night patient states she tossed and turned all night and her feeding tube came out. Patient is alert oriented, she is paralyzed on the left side from a previous stroke

## 2024-11-23 NOTE — ED Provider Notes (Signed)
 [place] [facility]ERGENCY DEPARTMENT AT [facility] Provider Note  CSN: [id number] Arrival date & time: 11/23/24 9047  Chief Complaint(s) No chief complaint on file.  HPI [name] is a 89 y.o. female with prior stroke, dependent on G-tube for feeding presenting with G-tube problem.  G-tube was replaced yesterday at [facility].  Originally she had a 38 French however they were only able to place a 27 French.  Unfortunately this fell out.  Patient denies any abdominal pain, fevers or chills, or any other new symptom.   Past Medical History Past Medical History:  Diagnosis Date   Anxiety    Back pain    Cancer (HCC)    Depression    Emphysema    Esophageal spasm    Lung nodule    OSA (obstructive sleep apnea)    Osteomalacia    Personal history of radiation therapy    Stroke (HCC)    Vertigo    Vitamin D  deficiency    Patient Active Problem List   Diagnosis Date Noted   Dislodged gastrostomy tube 11/23/2024   Malnutrition of moderate degree 10/22/2024   Acute metabolic encephalopathy 10/21/2024   Volume depletion 10/21/2024   CAP (community acquired pneumonia) 10/21/2024   Essential hypertension 10/21/2024   Delusion (HCC) 10/21/2024   Hemiparesis affecting left side as late effect of cerebrovascular accident (HCC) 08/14/2024   Left-sided face pain 08/14/2024   Acute ischemic right MCA stroke (HCC) 07/08/2024   Sepsis (HCC) 06/26/2024   Moderate malnutrition 06/26/2024   Middle cerebral artery embolism, right 06/25/2024   Cholecystitis 06/21/2024   Abnormal LFTs 06/21/2024   Hyponatremia 06/21/2024   Asymptomatic bacteriuria 06/21/2024   Family history of breast cancer 09/09/2020   Carcinoma of lower-inner quadrant of right breast in female, estrogen receptor positive (HCC) 09/08/2020   Malignant neoplasm of upper-inner quadrant of right breast in female, estrogen receptor positive (HCC) 09/03/2020   Rib fractures 03/14/2020   Vertigo 09/13/2017    Abnormal finding on imaging 04/22/2012   Dyslipidemia 04/22/2012   Depression 04/22/2012   Anxiety 04/22/2012   Tobacco abuse 04/22/2012   COPD (chronic obstructive pulmonary disease) (HCC) 04/22/2012   Gait abnormality 04/22/2012   Home Medication(s) Prior to Admission medications   Medication Sig Start Date End Date Taking? Authorizing Provider  acetaminophen  (TYLENOL ) 650 MG CR tablet Take 1 tablet (650 mg total) by mouth 3 (three) times daily. 07/31/24  Yes Love, Sharlet RAMAN, PA-C  albuterol  (PROVENTIL  HFA;VENTOLIN  HFA) 108 (90 Base) MCG/ACT inhaler Inhale 2 puffs into the lungs every 6 (six) hours as needed for wheezing or shortness of breath.   Yes [provider]  arformoterol  (BROVANA ) 15 MCG/2ML NEBU Take 2 mLs (15 mcg total) by nebulization 2 (two) times daily. 08/07/24  Yes Raulkar, Sven SQUIBB, MD  aspirin  81 MG chewable tablet Place 81 mg into feeding tube daily.   Yes [provider]  atorvastatin  (LIPITOR) 10 MG tablet Take 1 tablet (10 mg total) by mouth at bedtime. 07/31/24  Yes Love, Sharlet RAMAN, PA-C  b complex vitamins capsule Place 1 capsule into feeding tube daily.   Yes [provider]  buPROPion  (WELLBUTRIN ) 75 MG tablet Take 1 tablet (75 mg total) by mouth 2 (two) times daily. 07/31/24  Yes Love, Sharlet RAMAN, PA-C  busPIRone  (BUSPAR ) 5 MG tablet Take 1 tablet (5 mg total) by mouth 3 (three) times daily as needed (Anxiety). 07/31/24  Yes Love, Sharlet RAMAN, PA-C  Cholecalciferol  (VITAMIN D ) 50 MCG (  2000 UT) tablet Take 1 tablet (2,000 Units total) by mouth daily. 07/31/24  Yes Love, Sharlet RAMAN, PA-C  diclofenac  Sodium (VOLTAREN ) 1 % GEL Apply 2 g topically 4 (four) times daily. Patient taking differently: Apply 1 Application topically 4 (four) times daily as needed (pain). 07/31/24  Yes Love, Sharlet RAMAN, PA-C  escitalopram  (LEXAPRO ) 20 MG tablet Take 2 tablets (40 mg total) by mouth daily. Patient taking differently: Take 20 mg by mouth in the morning and at  bedtime. 07/31/24  Yes Love, Sharlet RAMAN, PA-C  famotidine  (PEPCID ) 20 MG tablet Place 20 mg into feeding tube 2 (two) times daily.   Yes [provider]  gabapentin  (NEURONTIN ) 300 MG capsule Take 1 capsule (300 mg total) by mouth daily as needed (for neuropathic pain). Patient taking differently: Take 300 mg by mouth 2 (two) times daily. 10/24/24  Yes Krishnan, Gokul, MD  ketoconazole (NIZORAL) 2 % cream Apply 1 Application topically daily. 11/19/24  Yes [provider]  lidocaine  4 % Place 1 patch onto the skin daily as needed (pain).   Yes [provider]  losartan  (COZAAR ) 25 MG tablet Take 1 tablet (25 mg total) by mouth daily. 10/24/24  Yes Patt Alm Macho, MD  melatonin 5 MG TABS Take 1 tablet (5 mg total) by mouth at bedtime. 07/31/24  Yes Love, Sharlet RAMAN, PA-C  Multiple Vitamins-Minerals (MULTIVITAMIN WOMEN 50+) TABS Take 1 tablet by mouth daily.   Yes [provider]  Multiple Vitamins-Minerals (OCUVITE ADULT 50+) CAPS Give 1 capsule by tube daily.   Yes [provider]  nitroGLYCERIN  (NITROSTAT ) 0.4 MG SL tablet Place 1 tablet (0.4 mg total) under the tongue every 5 (five) minutes as needed for chest pain. 07/08/24  Yes de Clint Kill, Cortney E, NP  Nystatin (GERHARDT'S BUTT CREAM) CREA Apply 1 Application topically 3 (three) times daily. Patient taking differently: Apply 1 Application topically as needed (peri-care). 07/08/24  Yes de Clint Kill, Cortney E, NP  polyethylene glycol (MIRALAX  / GLYCOLAX ) 17 g packet Place 17 g into feeding tube daily as needed for mild constipation. Patient taking differently: Place 8.5 g into feeding tube at bedtime. 07/08/24  Yes de Clint Kill, Cortney E, NP  Propylene Glycol (SYSTANE COMPLETE) 0.6 % SOLN Place 1 drop into both eyes daily.   Yes [provider]  QUEtiapine  (SEROQUEL ) 25 MG tablet Place 1 tablet (25 mg total) into feeding tube at bedtime. Patient taking differently: Place 50 mg into feeding tube at  bedtime. 10/24/24  Yes Krishnan, Gokul, MD  senna (GERI-KOT) 8.6 MG tablet Take 8.6 mg by mouth daily.   Yes [provider]  triamcinolone  cream (KENALOG ) 0.1 % SMARTSIG:1 Application Topical 2-3 Times Daily 11/19/24  Yes [provider]  Water  For Irrigation, Sterile (FREE WATER ) SOLN Place 120 mLs into feeding tube 3 (three) times daily. 07/31/24  Yes Love, Sharlet RAMAN, PA-C  YUPELRI  175 MCG/3ML nebulizer solution TAKE 3 MLS (175 MCG TOTAL) BY NEBULIZATION DAILY. 10/08/24  Yes Raulkar, Sven SQUIBB, MD  albuterol  (PROVENTIL ) (2.5 MG/3ML) 0.083% nebulizer solution Take 2.5 mg by nebulization every 6 (six) hours as needed for wheezing or shortness of breath.    [provider]  Nutritional Supplements (FEEDING SUPPLEMENT, OSMOLITE 1.5 CAL,) LIQD Place 237 mLs into feeding tube 2 (two) times daily. Patient taking differently: Place 237 mLs into feeding tube 4 (four) times daily. 07/31/24   Love, Sharlet RAMAN, PA-C  traMADol  (ULTRAM ) 50 MG tablet Take 0.5-1 tablets (25-50 mg total)  by mouth every 4 (four) hours as needed for severe pain (pain score 7-10). Patient not taking: Reported on 11/23/2024 07/31/24   Love, Sharlet GORMAN RIGGERS                                                                                                                                    Past Surgical History Past Surgical History:  Procedure Laterality Date   ABDOMINAL HYSTERECTOMY     APPENDECTOMY     BREAST BIOPSY Right 08/31/2020   BREAST EXCISIONAL BIOPSY Left 1978   BREAST LUMPECTOMY Right 09/30/2020   BREAST LUMPECTOMY WITH RADIOACTIVE SEED LOCALIZATION Right 09/30/2020   Procedure: RIGHT BREAST LUMPECTOMY WITH RADIOACTIVE SEED LOCALIZATION;  Surgeon: Curvin Deward MOULD, MD;  Location: [facility];  Service: General;  Laterality: Right;   CARPAL TUNNEL RELEASE     IR CT HEAD LTD  06/25/2024   IR GASTROSTOMY TUBE MOD SED  07/14/2024   IR PERCUTANEOUS ART THROMBECTOMY/INFUSION INTRACRANIAL INC DIAG  ANGIO  06/25/2024   RADIOLOGY WITH ANESTHESIA N/A 06/25/2024   Procedure: RADIOLOGY WITH ANESTHESIA;  Surgeon: Dolphus Carrion, MD;  Location: [facility] OR;  Service: Radiology;  Laterality: N/A;   ROTATOR CUFF REPAIR     Family History Family History  Problem Relation Age of Onset   Diabetes type II Other    Hypertension Other    Breast cancer Maternal Aunt        dx early 61s   Other Mother        died of natural causes at age 74   Other Father        stroke or heart attack while in shower   Diabetes type II Sister    Cancer Paternal Uncle        unknown type; dx early 4s    Social History Social History   Tobacco Use   Smoking status: Former    Current packs/day: 0.00    Average packs/day: 1 pack/day for 56.0 years (56.0 ttl pk-yrs)    Types: Cigarettes    Start date: 03/1960    Quit date: 03/2016    Years since quitting: 8.6   Smokeless tobacco: Never  Vaping Use   Vaping status: Never Used  Substance Use Topics   Alcohol  use: No    Alcohol /week: 0.0 standard drinks of alcohol    Drug use: No   Allergies Adhesive [tape], Ativan  [lorazepam ], and Latex  Review of Systems Review of Systems  All other systems reviewed and are negative.   Physical Exam Vital Signs  I have reviewed the triage vital signs BP (!) 135/58   Pulse 63   Temp 98.6 F (37 C) (Oral)   Resp 18   Ht 5' 1 (1.549 m)   Wt 61.2 kg   SpO2 96%   BMI 25.49 kg/m  Physical Exam Vitals and nursing note reviewed.  Constitutional:      General: She is  not in acute distress.    Appearance: She is well-developed.  HENT:     Head: Normocephalic and atraumatic.     Mouth/Throat:     Mouth: Mucous membranes are moist.  Eyes:     Pupils: Pupils are equal, round, and reactive to light.  Cardiovascular:     Rate and Rhythm: Normal rate and regular rhythm.     Heart sounds: No murmur heard. Pulmonary:     Effort: Pulmonary effort is normal. No respiratory distress.     Breath sounds: Normal breath  sounds.  Abdominal:     General: Abdomen is flat.     Palpations: Abdomen is soft.     Tenderness: There is no abdominal tenderness.     Comments: Left upper quadrant G-tube site without G-tube present  Musculoskeletal:        General: No tenderness.     Right lower leg: No edema.     Left lower leg: No edema.  Skin:    General: Skin is warm and dry.  Neurological:     General: No focal deficit present.     Mental Status: She is alert. Mental status is at baseline.  Psychiatric:        Mood and Affect: Mood normal.        Behavior: Behavior normal.     ED Results and Treatments Labs (all labs ordered are listed, but only abnormal results are displayed) Labs Reviewed  BASIC METABOLIC PANEL WITH GFR  CBC WITH DIFFERENTIAL/PLATELET  CBG MONITORING, ED                                                                                                                          Radiology DG Chest 2 View Result Date: 11/22/2024 CLINICAL DATA:  Right chest bony abnormality, left-sided weakness EXAM: CHEST - 2 VIEW COMPARISON:  10/24/2024 FINDINGS: Frontal and lateral views of the chest demonstrate an unremarkable cardiac silhouette. Stable aortic atherosclerosis. No acute airspace disease, effusion, or pneumothorax. No acute bony abnormalities. Multiple prior healed left rib fractures. IMPRESSION: 1. Stable chest, no acute process. Electronically Signed   By: Ozell Daring M.D.   On: 11/22/2024 19:30   DG Abdomen PEG Tube Location Result Date: 11/22/2024 CLINICAL DATA:  Peg tube replacement EXAM: ABDOMEN - 1 VIEW COMPARISON:  07/14/2024 FINDINGS: Supine frontal view of the abdomen excludes the right flank and lower pelvis by collimation. Percutaneous gastrostomy tube is identified within the gastric lumen. Contrast injected via the gastrostomy tube outlines the gastric rugal folds and proximal small bowel. No evidence of contrast extravasation. No bowel obstruction or ileus. IMPRESSION: 1.  Percutaneous gastrostomy tube within the gastric lumen. Electronically Signed   By: Ozell Daring M.D.   On: 11/22/2024 19:30    Pertinent labs & imaging results that were available during my care of the patient were reviewed by me and considered in my medical decision making (see MDM for details).  Medications Ordered in ED Medications  sodium chloride  0.9 % bolus 1,000 mL (has no administration in time range)  enoxaparin  (LOVENOX ) injection 40 mg (has no administration in time range)  sodium chloride  flush (NS) 0.9 % injection 3 mL (has no administration in time range)  0.9 %  sodium chloride  infusion (has no administration in time range)  acetaminophen  (TYLENOL ) tablet 650 mg (has no administration in time range)    Or  acetaminophen  (TYLENOL ) suppository 650 mg (has no administration in time range)  albuterol  (PROVENTIL ) (2.5 MG/3ML) 0.083% nebulizer solution 2.5 mg (has no administration in time range)                                                                                                                                     Procedures .Gastrostomy tube replacement  Date/Time: 11/23/2024 3:54 PM  Performed by: Francesca Elsie CROME, MD Authorized by: Francesca Elsie CROME, MD  Consent: Verbal consent obtained Risks and benefits: risks, benefits and alternatives were discussed Consent given by: patient and guardian Required items: required blood products, implants, devices, and special equipment available Patient identity confirmed: verbally with patient Local anesthesia used: no  Anesthesia: Local anesthesia used: no  Sedation: Patient sedated: no  Patient tolerance: patient tolerated the procedure well with no immediate complications Comments: Attempted to place 62 French G-tube without success.  Able to place 12 French Foley catheter through G-tube stoma     (including critical care time)  Medical Decision Making / ED Course   MDM:  89 year old G-tube  dependent presenting with G-tube issue.  Patient overall is very well-appearing.  She has no pain or discomfort.  Attempted to replace G-tube, 16 French G-tube would not pass.  14 French was placed yesterday however central supply, ICU, pediatrics, OR, had no 14 French G-tube.  12 French catheter was able to be placed through stoma.  Discussed with IR, given today is Sunday they will not be able to replace it.  Offered to discharge patient have her come back but daughter would prefer she stay overnight as she does not take in much by mouth.  Discussed with Dr. Claudene who will admit patient for obs.      Additional history obtained: -Additional history obtained from family and ems -External records from outside source obtained and reviewed including: Chart review including previous notes, labs, imaging, consultation notes including prior notes    Lab Tests: -I ordered, reviewed, and interpreted labs.   The pertinent results include:   Labs Reviewed  BASIC METABOLIC PANEL WITH GFR  CBC WITH DIFFERENTIAL/PLATELET  CBG MONITORING, ED    Notable for normal labs    Medicines ordered and prescription drug management: Meds ordered this encounter  Medications   sodium chloride  0.9 % bolus 1,000 mL   enoxaparin  (LOVENOX ) injection 40 mg   sodium chloride  flush (NS) 0.9 % injection 3 mL   0.9 %  sodium chloride  infusion   OR Linked Order  Group    acetaminophen  (TYLENOL ) tablet 650 mg    acetaminophen  (TYLENOL ) suppository 650 mg   albuterol  (PROVENTIL ) (2.5 MG/3ML) 0.083% nebulizer solution 2.5 mg    -I have reviewed the patients home medicines and have made adjustments as needed  Reevaluation: After the interventions noted above, I reevaluated the patient and found that their symptoms have improved  Co morbidities that complicate the patient evaluation  Past Medical History:  Diagnosis Date   Anxiety    Back pain    Cancer (HCC)    Depression    Emphysema    Esophageal spasm     Lung nodule    OSA (obstructive sleep apnea)    Osteomalacia    Personal history of radiation therapy    Stroke (HCC)    Vertigo    Vitamin D  deficiency       Dispostion: Disposition decision including need for hospitalization was considered, and patient admitted to the hospital.    Final Clinical Impression(s) / ED Diagnoses Final diagnoses:  Gastrojejunostomy tube dislodgement     This chart was dictated using voice recognition software.  Despite best efforts to proofread,  errors can occur which can change the documentation meaning.    Francesca Elsie CROME, MD 11/23/24 [id number]

## 2024-11-23 NOTE — H&P (Signed)
 History and Physical    Patient: [name] FMW:[id number] DOB: Dec 28, 1931 DOA: 11/23/2024 DOS: the patient was seen and examined on 11/23/2024 PCP: [name], MD  Patient coming from: Home via EMS  Chief Complaint: Dislodged feeding tube  HPI: [name] is a 89 y.o. female with medical history significant of CVA with residual left hemiparesis, OSA, emphysema, depression/anxiety presents with a dislodged G tube. She is accompanied by her daughter  The G tube was initially dislodged yesterday, and a new one was placed at [facility]. However, it dislodged again overnight, resulting in her not receiving any medications or fluids since last night.  She has a history of stroke with left-sided paralysis, which impairs her swallowing and eating, necessitating the use of a G tube. Her speech is reported to be great, but she has swallowing difficulties and has previously used nectar thick liquids and pureed food. She can swallow if the medication is ground up and mixed with applesauce, although some medications may not be suitable for grinding.  The G tube dislodgement occurred because the tube separated from the top part, and the balloon collapsed. She has not used a binder at home to secure the G tube as this issue has not occurred before.  In the emergency department patient was noted to be afebrile with stable vital signs.  Labs including CBC and BMP were unremarkable.  IR have been consulted and noted that they were unable to replace G-tube until 12/8. [facility] called to admit Review of Systems: As mentioned in the history of present illness. All other systems reviewed and are negative. Past Medical History:  Diagnosis Date   Anxiety    Back pain    Cancer (HCC)    Depression    Emphysema    Esophageal spasm    Lung nodule    OSA (obstructive sleep apnea)    Osteomalacia    Personal history of radiation therapy    Stroke (HCC)    Vertigo    Vitamin D  deficiency    Past  Surgical History:  Procedure Laterality Date   ABDOMINAL HYSTERECTOMY     APPENDECTOMY     BREAST BIOPSY Right 08/31/2020   BREAST EXCISIONAL BIOPSY Left 1978   BREAST LUMPECTOMY Right 09/30/2020   BREAST LUMPECTOMY WITH RADIOACTIVE SEED LOCALIZATION Right 09/30/2020   Procedure: RIGHT BREAST LUMPECTOMY WITH RADIOACTIVE SEED LOCALIZATION;  Surgeon: Curvin Deward MOULD, MD;  Location: [facility];  Service: General;  Laterality: Right;   CARPAL TUNNEL RELEASE     IR CT HEAD LTD  06/25/2024   IR GASTROSTOMY TUBE MOD SED  07/14/2024   IR PERCUTANEOUS ART THROMBECTOMY/INFUSION INTRACRANIAL INC DIAG ANGIO  06/25/2024   RADIOLOGY WITH ANESTHESIA N/A 06/25/2024   Procedure: RADIOLOGY WITH ANESTHESIA;  Surgeon: Dolphus Carrion, MD;  Location: [facility] OR;  Service: Radiology;  Laterality: N/A;   ROTATOR CUFF REPAIR     Social History:  reports that she quit smoking about 8 years ago. Her smoking use included cigarettes. She started smoking about 64 years ago. She has a 56 pack-year smoking history. She has never used smokeless tobacco. She reports that she does not drink alcohol  and does not use drugs.  Allergies  Allergen Reactions   Adhesive [Tape] Itching and Rash   Ativan  [Lorazepam ] Anxiety and Other (See Comments)    Altered mental state   Latex Itching and Rash    Family History  Problem Relation Age of Onset   Diabetes type II Other  Hypertension Other    Breast cancer Maternal Aunt        dx early 3s   Other Mother        died of natural causes at age 36   Other Father        stroke or heart attack while in shower   Diabetes type II Sister    Cancer Paternal Uncle        unknown type; dx early 53s    Prior to Admission medications   Medication Sig Start Date End Date Taking? Authorizing Provider  acetaminophen  (TYLENOL ) 650 MG CR tablet Take 1 tablet (650 mg total) by mouth 3 (three) times daily. 07/31/24   Love, Sharlet RAMAN, PA-C  albuterol  (PROVENTIL  HFA;VENTOLIN  HFA)  108 (90 Base) MCG/ACT inhaler Inhale 2 puffs into the lungs every 6 (six) hours as needed for wheezing or shortness of breath.    [provider]  albuterol  (PROVENTIL ) (2.5 MG/3ML) 0.083% nebulizer solution Take 2.5 mg by nebulization every 6 (six) hours as needed for wheezing or shortness of breath.    [provider]  arformoterol  (BROVANA ) 15 MCG/2ML NEBU Take 2 mLs (15 mcg total) by nebulization 2 (two) times daily. 08/07/24   Raulkar, Sven SQUIBB, MD  aspirin  81 MG chewable tablet Place 81 mg into feeding tube daily.    [provider]  atorvastatin  (LIPITOR) 10 MG tablet Take 1 tablet (10 mg total) by mouth at bedtime. 07/31/24   Maurice Sharlet RAMAN, PA-C  b complex vitamins capsule Place 1 capsule into feeding tube daily.    [provider]  buPROPion  (WELLBUTRIN ) 75 MG tablet Take 1 tablet (75 mg total) by mouth 2 (two) times daily. 07/31/24   Love, Sharlet RAMAN, PA-C  busPIRone  (BUSPAR ) 5 MG tablet Take 1 tablet (5 mg total) by mouth 3 (three) times daily as needed (Anxiety). Patient taking differently: Take 5 mg by mouth 3 (three) times daily. 07/31/24   Love, Sharlet RAMAN, PA-C  Cholecalciferol  (VITAMIN D ) 50 MCG (2000 UT) tablet Take 1 tablet (2,000 Units total) by mouth daily. 07/31/24   Love, Sharlet RAMAN, PA-C  diclofenac  Sodium (VOLTAREN ) 1 % GEL Apply 2 g topically 4 (four) times daily. Patient taking differently: Apply 1 Application topically 4 (four) times daily as needed (pain). 07/31/24   Love, Sharlet RAMAN, PA-C  escitalopram  (LEXAPRO ) 20 MG tablet Take 2 tablets (40 mg total) by mouth daily. Patient taking differently: Take 20 mg by mouth in the morning and at bedtime. 07/31/24   Love, Sharlet RAMAN, PA-C  famotidine  (PEPCID ) 20 MG tablet Place 20 mg into feeding tube 2 (two) times daily.    [provider]  gabapentin  (NEURONTIN ) 300 MG capsule Take 1 capsule (300 mg total) by mouth daily as needed (for neuropathic pain). 10/24/24   Verdene Purchase, MD  lidocaine  4 %  Place 1 patch onto the skin daily as needed (pain).    [provider]  losartan  (COZAAR ) 25 MG tablet Take 1 tablet (25 mg total) by mouth daily. 10/24/24   Patt Alm Macho, MD  melatonin 5 MG TABS Take 1 tablet (5 mg total) by mouth at bedtime. 07/31/24   Love, Sharlet RAMAN, PA-C  Multiple Vitamins-Minerals (MULTIVITAMIN WOMEN 50+) TABS Take 1 tablet by mouth daily.    [provider]  Multiple Vitamins-Minerals (OCUVITE ADULT 50+) CAPS Give 1 capsule by tube daily.    [provider]  nitroGLYCERIN  (NITROSTAT ) 0.4 MG SL tablet Place 1 tablet (0.4 mg total)  under the tongue every 5 (five) minutes as needed for chest pain. 07/08/24   de Clint Kill, Cortney E, NP  Nutritional Supplements (FEEDING SUPPLEMENT, OSMOLITE 1.5 CAL,) LIQD Place 237 mLs into feeding tube 2 (two) times daily. Patient taking differently: Place 237 mLs into feeding tube 4 (four) times daily. 07/31/24   Love, Sharlet RAMAN, PA-C  Nystatin (GERHARDT'S BUTT CREAM) CREA Apply 1 Application topically 3 (three) times daily. Patient taking differently: Apply 1 Application topically as needed (peri-care). 07/08/24   de Clint Kill, Cortney E, NP  polyethylene glycol (MIRALAX  / GLYCOLAX ) 17 g packet Place 17 g into feeding tube daily as needed for mild constipation. Patient taking differently: Place 8.5 g into feeding tube at bedtime. 07/08/24   de Clint Kill, Cortney E, NP  Propylene Glycol (SYSTANE COMPLETE) 0.6 % SOLN Place 1 drop into both eyes daily.    [provider]  QUEtiapine  (SEROQUEL ) 25 MG tablet Place 1 tablet (25 mg total) into feeding tube at bedtime. 10/24/24   Krishnan, Gokul, MD  senna (GERI-KOT) 8.6 MG tablet Take 8.6 mg by mouth daily.    [provider]  traMADol  (ULTRAM ) 50 MG tablet Take 0.5-1 tablets (25-50 mg total) by mouth every 4 (four) hours as needed for severe pain (pain score 7-10). Patient taking differently: Take 50 mg by mouth See admin instructions. Give 1 tablet (50mg ) by  mouth every night. May give an additional 1 tablet during the day if needed for severe pain. 07/31/24   Love, Sharlet RAMAN, PA-C  Water  For Irrigation, Sterile (FREE WATER ) SOLN Place 120 mLs into feeding tube 3 (three) times daily. 07/31/24   Love, Sharlet RAMAN, PA-C  YUPELRI  175 MCG/3ML nebulizer solution TAKE 3 MLS (175 MCG TOTAL) BY NEBULIZATION DAILY. 10/08/24   Lorilee Sven SQUIBB, MD    Physical Exam: Vitals:   11/23/24 1006 11/23/24 1010 11/23/24 1416  BP: 124/65  (!) 135/58  Pulse: 68  63  Resp: 20  18  Temp: 98.4 F (36.9 C)  98.5 F (36.9 C)  TempSrc: Oral  Oral  SpO2: 98%  96%  Weight:  61.2 kg   Height:  5' 1 (1.549 m)    Constitutional: Elderly female currently in no acute distress Eyes: PERRL, lids and conjunctivae normal ENMT: Mucous membranes are moist. Posterior pharynx clear of any exudate or lesions.Normal dentition.  Neck: normal, supple  Respiratory: clear to auscultation bilaterally, no wheezing, no crackles. Normal respiratory effort. No accessory muscle use.  Cardiovascular: Regular rate and rhythm, no murmurs / rubs / gallops. No extremity edema. 2+ pedal pulses. No carotid bruits.  Abdomen: no tenderness, no masses palpated. No hepatosplenomegaly. Bowel sounds positive.  Musculoskeletal: no clubbing / cyanosis. No joint deformity upper and lower extremities. Good ROM, no contractures. Normal muscle tone.  Skin: no rashes, lesions, ulcers. No induration Neurologic: CN 2-12 grossly intact.  Left-sided hemiaplagia Psychiatric: Normal judgment and insight. Alert and oriented x 3. Normal mood.   Data Reviewed:  Reviewed labs and pertinent records as documented.  Assessment and Plan:  Dislodged G-tube Dysphagia Patient presents after Foley catheter was dislodged overnight.  She had a 14 G-tube replaced yesterday at [facility].  No subsequent 14 G-tubes available at this time.  IR consulted and plan on replacing tomorrow.  Dr. Mercer noted that she is able to eat  pured food with nectar thick liquids.  IR have been consulted by the ED provider.  A Foley catheter was placed in interim until procedure. -  Admit to a MedSurg bed - Aspiration precautions with elevation head of bed - Pured diet with nectar thick liquids and n.p.o. midnight for procedure - Home meds to be crushed inapplesauce - Orders placed for abdominal binder - IR consulted for need of G-tube placement in a.m.  History of CVA with residual deficit Hyperlipidemia History of right MCA CVA with left-sided hemiparesis and dysphagia. - Continue aspirin  and atorvastatin   COPD/emphysema Patient without acute exacerbation at this time. - Continue home inhalers  Anxiety and depression - Continue Wellbutrin , BuSpar ,  OSA - Continue CPAP nightly  DVT prophylaxis: Lovenox  Advance Care Planning:   Code Status: Full Code    Consults: IR  Family Communication: Daughter updated at bedside  Severity of Illness: The appropriate patient status for this patient is INPATIENT. Inpatient status is judged to be reasonable and necessary in order to provide the required intensity of service to ensure the patient's safety. The patient's presenting symptoms, physical exam findings, and initial radiographic and laboratory data in the context of their chronic comorbidities is felt to place them at high risk for further clinical deterioration. Furthermore, it is not anticipated that the patient will be medically stable for discharge from the hospital within 2 midnights of admission.   * I certify that at the point of admission it is my clinical judgment that the patient will require inpatient hospital care spanning beyond 2 midnights from the point of admission due to high intensity of service, high risk for further deterioration and high frequency of surveillance required.*  Author: Maximino DELENA Sharps, MD 11/23/2024 3:33 PM  For on call review [email or web address].

## 2024-11-23 NOTE — ED Notes (Signed)
 Breathing is even and unlabored. PIV started, pt tolerated well.  PT bed bound.

## 2024-11-24 ENCOUNTER — Observation Stay (HOSPITAL_COMMUNITY)

## 2024-11-24 DIAGNOSIS — F32A Depression, unspecified: Secondary | ICD-10-CM | POA: Diagnosis not present

## 2024-11-24 DIAGNOSIS — I69354 Hemiplegia and hemiparesis following cerebral infarction affecting left non-dominant side: Secondary | ICD-10-CM | POA: Diagnosis not present

## 2024-11-24 DIAGNOSIS — G4733 Obstructive sleep apnea (adult) (pediatric): Secondary | ICD-10-CM | POA: Diagnosis not present

## 2024-11-24 DIAGNOSIS — T85528A Displacement of other gastrointestinal prosthetic devices, implants and grafts, initial encounter: Secondary | ICD-10-CM | POA: Diagnosis not present

## 2024-11-24 DIAGNOSIS — Z931 Gastrostomy status: Secondary | ICD-10-CM | POA: Diagnosis not present

## 2024-11-24 DIAGNOSIS — F419 Anxiety disorder, unspecified: Secondary | ICD-10-CM | POA: Diagnosis not present

## 2024-11-24 DIAGNOSIS — J439 Emphysema, unspecified: Secondary | ICD-10-CM | POA: Diagnosis not present

## 2024-11-24 DIAGNOSIS — I69391 Dysphagia following cerebral infarction: Secondary | ICD-10-CM | POA: Diagnosis not present

## 2024-11-24 HISTORY — PX: IR REPLACE G-TUBE SIMPLE WO FLUORO: IMG2323

## 2024-11-24 MED ORDER — DIATRIZOATE MEGLUMINE & SODIUM 66-10 % PO SOLN
30.0000 mL | Freq: Once | ORAL | Status: AC
  Administered 2024-11-24: 30 mL
  Filled 2024-11-24: qty 30

## 2024-11-24 MED ORDER — DIATRIZOATE MEGLUMINE & SODIUM 66-10 % PO SOLN
ORAL | Status: AC
  Filled 2024-11-24: qty 30

## 2024-11-24 NOTE — TOC Transition Note (Signed)
 Transition of Care [facility] - Discharge Note   Patient Details  Name: [name] MRN: [id number] Date of Birth: 11-25-1932  Transition of Care [facility]) CM/SW Contact:  Roxie [name], RN Phone Number: 11/24/2024, 11:53 AM   Clinical Narrative:    Patient stable for discharge.  Spoke to patient and daughters at bedside.  Patient lives with daughter and has all needed DME. Patient receives tube feeds from [facility].  Patient is active with [facility] for home health. Resumption home health orders added. Kelly with [facility] aware of discharge.  Transportation resources given to daughter.  [facility] notified of discharge.     Final next level of care: Home w Home Health Services Barriers to Discharge: Barriers Resolved   Patient Goals and CMS Choice Patient states their goals for this hospitalization and ongoing recovery are:: return home with daughter CMS Medicare.gov Compare Post Acute Care list provided to:: Patient Choice offered to / list presented to : Patient      Discharge Placement                   home    Discharge Plan and Services Additional resources added to the After Visit Summary for                            HH Arranged: RN, PT, OT, Speech Therapy HH Agency: [facility] Date [facility] Agency Contacted: 11/24/24 Time HH Agency Contacted: 1150 Representative spoke with at [facility] Agency: [place]  Social Drivers of Health (SDOH) Interventions SDOH Screenings   Food Insecurity: No Food Insecurity (11/23/2024)  Housing: Low Risk  (11/23/2024)  Transportation Needs: Unmet Transportation Needs (11/23/2024)  Utilities: Not At Risk (11/23/2024)  Depression (PHQ2-9): Medium Risk (09/08/2024)  Social Connections: Socially Isolated (11/23/2024)  Tobacco Use: Medium Risk (11/23/2024)     Readmission Risk Interventions    10/24/2024   10:53 AM  Readmission Risk Prevention Plan  Transportation Screening Complete  Medication Review (RN Care Manager)  Complete  PCP or Specialist appointment within 3-5 days of discharge Complete  HRI or Home Care Consult Complete  SW Recovery Care/Counseling Consult Complete  Palliative Care Screening Complete  Skilled Nursing Facility Not Applicable

## 2024-11-24 NOTE — Procedures (Signed)
 PROCEDURE SUMMARY:  Successful exchange of 14 Fr balloon retention gastrostomy tube. No complications.  EBL = none.  PEG tube location xray shows the new G tube in appropriate position.  The tube is ready for immediate use.   Please see full dictation in imaging section of Epic for procedure details.   Bryor Rami H Serafino Burciaga PA-C 11/24/2024 9:23 AM

## 2024-11-24 NOTE — Discharge Summary (Signed)
 Physician Discharge Summary  [name] FMW:[id number] DOB: Jan 10, 1932 DOA: 11/23/2024  PCP: [name], MD  Admit date: 11/23/2024 Discharge date: 11/24/24  Admitted From: Home Disposition: Home Recommendations for Outpatient Follow-up:  Outpatient follow-up with PCP in 1 to 2 weeks Please follow up on the following pending results: None  Home Health: Resumed HHPT/OT/RN/SLP Equipment/Devices: Patient has appropriate DME's  Discharge Condition: Stable CODE STATUS: Full code    Hospital course 89 year old F with PMH of CVA with residual left-sided weakness, dysphagia and PEG tube dependent, emphysema/COPD, OSA, anxiety and depression brought to ED by EMS with G-tube dislodgment for the second time in 2 days.  Initially dislodged on 12/5 and that she had it replaced at [facility] on 12/6.  G-tube dislodged for the second time on 12/7.  Patient uses G-tube for feeding and medication administration.  She is also on pured diet.  In ED, stable vitals.  BMP and CBC without significant finding.  IR consulted.  G-tube replaced the morning of 11/24/2024.  Patient was able to get her morning medications by G-tube, and discharged home.  Home health resumed on discharge.  See individual problem list below for more.   Problems addressed during this hospitalization Principal Problem:   Dislodged gastrostomy tube Active Problems:   Dysphagia following cerebral infarction   Dyslipidemia   Hemiparesis affecting left side as late effect of cerebrovascular accident (HCC)   COPD (chronic obstructive pulmonary disease) (HCC)   Depression   Anxiety   OSA (obstructive sleep apnea)    Body mass index is 25.49 kg/m.           Consultations: Interventional radiology  Time spent 35  minutes  Vital signs Vitals:   11/23/24 2100 11/24/24 0500 11/24/24 0837 11/24/24 0847  BP: 106/62 110/78 (!) 142/57   Pulse: 65 69 61 63  Temp: 98.2 F (36.8 C) 97.8 F (36.6 C) 97.6  F (36.4 C)   Resp: 18 17 16 18   Height:      Weight:      SpO2: 95%  97% 97%  TempSrc: Oral Oral Oral   BMI (Calculated):         Discharge exam  GENERAL: No apparent distress.  Nontoxic. HEENT: MMM.  Vision and hearing grossly intact.  NECK: Supple.  No apparent JVD.  RESP:  No IWOB.  Fair aeration bilaterally. CVS:  RRR. Heart sounds normal.  ABD/GI/GU: BS+. Abd soft, NTND.  G-tube in place. MSK/EXT:  Moves extremities.  LUE 0/5.  LLE 2/5.  RLE and RUE 4/5. SKIN: no apparent skin lesion or wound NEURO: Awake and alert.  Oriented fairly.  No apparent focal neuro deficit. PSYCH: Calm. Normal affect.   Discharge Instructions Discharge Instructions     Discharge instructions   Complete by: As directed    It has been a pleasure taking care of you!  You were hospitalized due to G-tube dislodgment.  Your G-tube has been replaced and ready to use.    Take care,   Increase activity slowly   Complete by: As directed       Allergies as of 11/24/2024       Reactions   Adhesive [tape] Itching, Rash   Ativan  [lorazepam ] Anxiety, Other (See Comments)   Altered mental state   Latex Itching, Rash        Medication List     TAKE these medications    acetaminophen  650 MG CR tablet Commonly known as: TYLENOL  Take 1 tablet (650 mg  total) by mouth 3 (three) times daily.   albuterol  108 (90 Base) MCG/ACT inhaler Commonly known as: VENTOLIN  HFA Inhale 2 puffs into the lungs every 6 (six) hours as needed for wheezing or shortness of breath.   albuterol  (2.5 MG/3ML) 0.083% nebulizer solution Commonly known as: PROVENTIL  Take 2.5 mg by nebulization every 6 (six) hours as needed for wheezing or shortness of breath.   arformoterol  15 MCG/2ML Nebu Commonly known as: Brovana  Take 2 mLs (15 mcg total) by nebulization 2 (two) times daily.   aspirin  81 MG chewable tablet Place 81 mg into feeding tube daily.   atorvastatin  10 MG tablet Commonly known as: LIPITOR Take 1  tablet (10 mg total) by mouth at bedtime.   b complex vitamins capsule Place 1 capsule into feeding tube daily.   buPROPion  75 MG tablet Commonly known as: WELLBUTRIN  Take 1 tablet (75 mg total) by mouth 2 (two) times daily.   busPIRone  5 MG tablet Commonly known as: BUSPAR  Take 1 tablet (5 mg total) by mouth 3 (three) times daily as needed (Anxiety).   escitalopram  20 MG tablet Commonly known as: LEXAPRO  Take 2 tablets (40 mg total) by mouth daily. What changed:  how much to take when to take this   famotidine  20 MG tablet Commonly known as: PEPCID  Place 20 mg into feeding tube 2 (two) times daily.   feeding supplement (OSMOLITE 1.5 CAL) Liqd Place 237 mLs into feeding tube 2 (two) times daily. What changed: when to take this   free water  Soln Place 120 mLs into feeding tube 3 (three) times daily.   gabapentin  300 MG capsule Commonly known as: NEURONTIN  Take 1 capsule (300 mg total) by mouth daily as needed (for neuropathic pain). What changed: when to take this   Gerhardt's butt cream Crea Apply 1 Application topically 3 (three) times daily. What changed:  when to take this reasons to take this   Geri-kot 8.6 MG tablet Generic drug: senna Take 8.6 mg by mouth daily.   ketoconazole 2 % cream Commonly known as: NIZORAL Apply 1 Application topically daily.   lidocaine  4 % Place 1 patch onto the skin daily as needed (pain).   losartan  25 MG tablet Commonly known as: Cozaar  Take 1 tablet (25 mg total) by mouth daily.   melatonin 5 MG Tabs Take 1 tablet (5 mg total) by mouth at bedtime.   Multivitamin Women 50+ Tabs Take 1 tablet by mouth daily.   Ocuvite Adult 50+ Caps Give 1 capsule by tube daily.   nitroGLYCERIN  0.4 MG SL tablet Commonly known as: NITROSTAT  Place 1 tablet (0.4 mg total) under the tongue every 5 (five) minutes as needed for chest pain.   polyethylene glycol 17 g packet Commonly known as: MIRALAX  / GLYCOLAX  Place 17 g into feeding  tube daily as needed for mild constipation. What changed:  how much to take when to take this   QUEtiapine  25 MG tablet Commonly known as: SEROQUEL  Place 1 tablet (25 mg total) into feeding tube at bedtime. What changed: how much to take   Systane Complete 0.6 % Soln Generic drug: Propylene Glycol Place 1 drop into both eyes daily.   traMADol  50 MG tablet Commonly known as: ULTRAM  Take 0.5-1 tablets (25-50 mg total) by mouth every 4 (four) hours as needed for severe pain (pain score 7-10).   triamcinolone  cream 0.1 % Commonly known as: KENALOG  SMARTSIG:1 Application Topical 2-3 Times Daily   Vitamin D  50 MCG (2000 UT) tablet Take 1  tablet (2,000 Units total) by mouth daily.   Voltaren  Arthritis Pain 1 % Gel Generic drug: diclofenac  Sodium Apply 2 g topically 4 (four) times daily. What changed:  how much to take when to take this reasons to take this   Yupelri  175 MCG/3ML nebulizer solution Generic drug: revefenacin  TAKE 3 MLS (175 MCG TOTAL) BY NEBULIZATION DAILY.         Procedures/Studies:   DG ABDOMEN PEG TUBE LOCATION Result Date: 11/24/2024 CLINICAL DATA:  Gastrostomy tube replacement. EXAM: ABDOMEN - 1 VIEW COMPARISON:  11/22/2024 FINDINGS: Gastric lumen is opacified with contrast material. Stomach is nondistended. Gastrostomy tube overlies left upper quadrant. Contrast material in the colon likely secondary to prior study from 11/22/2024. IMPRESSION: Gastrostomy tube overlies the left upper quadrant. Contrast material in the stomach and colon. Electronically Signed   By: Camellia Candle M.D.   On: 11/24/2024 10:31   IR REPLACE G-TUBE SIMPLE WO FLUORO Result Date: 11/24/2024 INDICATION: Patient with a dislodged gastrostomy tube. Patient seen at bedside for gastrostomy tube exchange. EXAM: EXCHANGE OF GASTROSTOMY TUBE MEDICATIONS: NONE COMPLICATIONS: None immediate. PROCEDURE: Informed verbal consent was obtained from the patient and her daughter after a thorough  discussion of the procedural risks, benefits and alternatives. All questions were addressed. Retention balloon was deflated and existing Foley catheter was removed and exchanged for a new 14-French balloon inflatable gastrostomy tube. The balloon was inflated with 6 mL saline and disc was cinched. The patient tolerated the procedure well without immediate postprocedural complication. Postprocedural gastrostomy tube location confirmation x-ray shows the tube in appropriate position. The new gastrostomy tube is ready for immediate use. IMPRESSION: Successful replacement of a new 14 French balloon retention gastrostomy tube. Performed by: Aimee Han, PA-C. Electronically Signed   By: Wilkie Lent M.D.   On: 11/24/2024 10:15   DG Chest 2 View Result Date: 11/22/2024 CLINICAL DATA:  Right chest bony abnormality, left-sided weakness EXAM: CHEST - 2 VIEW COMPARISON:  10/24/2024 FINDINGS: Frontal and lateral views of the chest demonstrate an unremarkable cardiac silhouette. Stable aortic atherosclerosis. No acute airspace disease, effusion, or pneumothorax. No acute bony abnormalities. Multiple prior healed left rib fractures. IMPRESSION: 1. Stable chest, no acute process. Electronically Signed   By: Ozell Daring M.D.   On: 11/22/2024 19:30   DG Abdomen PEG Tube Location Result Date: 11/22/2024 CLINICAL DATA:  Peg tube replacement EXAM: ABDOMEN - 1 VIEW COMPARISON:  07/14/2024 FINDINGS: Supine frontal view of the abdomen excludes the right flank and lower pelvis by collimation. Percutaneous gastrostomy tube is identified within the gastric lumen. Contrast injected via the gastrostomy tube outlines the gastric rugal folds and proximal small bowel. No evidence of contrast extravasation. No bowel obstruction or ileus. IMPRESSION: 1. Percutaneous gastrostomy tube within the gastric lumen. Electronically Signed   By: Ozell Daring M.D.   On: 11/22/2024 19:30       The results of significant diagnostics from  this hospitalization (including imaging, microbiology, ancillary and laboratory) are listed below for reference.     Microbiology: No results found for this or any previous visit (from the past 240 hours).   Labs:  CBC: Recent Labs  Lab 11/23/24 1406  WBC 7.2  NEUTROABS 5.1  HGB 12.8  HCT 39.0  MCV 95.6  PLT 155   BMP &GFR Recent Labs  Lab 11/23/24 1406  NA 142  K 4.4  CL 106  CO2 27  GLUCOSE 88  BUN 12  CREATININE 0.58  CALCIUM  9.2   Estimated Creatinine Clearance:  39.3 mL/min (by C-G formula based on SCr of 0.58 mg/dL). Liver & Pancreas: No results for input(s): AST, ALT, ALKPHOS, BILITOT, PROT, ALBUMIN in the last 168 hours. No results for input(s): LIPASE, AMYLASE in the last 168 hours. No results for input(s): AMMONIA in the last 168 hours. Diabetic: No results for input(s): HGBA1C in the last 72 hours. Recent Labs  Lab 11/23/24 1410  GLUCAP 83   Cardiac Enzymes: No results for input(s): CKTOTAL, CKMB, CKMBINDEX, TROPONINI in the last 168 hours. No results for input(s): PROBNP in the last 8760 hours. Coagulation Profile: No results for input(s): INR, PROTIME in the last 168 hours. Thyroid  Function Tests: No results for input(s): TSH, T4TOTAL, FREET4, T3FREE, THYROIDAB in the last 72 hours. Lipid Profile: No results for input(s): CHOL, HDL, LDLCALC, TRIG, CHOLHDL, LDLDIRECT in the last 72 hours. Anemia Panel: No results for input(s): VITAMINB12, FOLATE, FERRITIN, TIBC, IRON, RETICCTPCT in the last 72 hours. Urine analysis:    Component Value Date/Time   COLORURINE AMBER (A) 10/21/2024 1524   APPEARANCEUR CLOUDY (A) 10/21/2024 1524   LABSPEC 1.015 10/21/2024 1524   PHURINE 7.0 10/21/2024 1524   GLUCOSEU NEGATIVE 10/21/2024 1524   HGBUR NEGATIVE 10/21/2024 1524   BILIRUBINUR NEGATIVE 10/21/2024 1524   KETONESUR NEGATIVE 10/21/2024 1524   PROTEINUR NEGATIVE 10/21/2024 1524    UROBILINOGEN 1.0 11/01/2007 1614   NITRITE NEGATIVE 10/21/2024 1524   LEUKOCYTESUR NEGATIVE 10/21/2024 1524   Sepsis Labs: Invalid input(s): PROCALCITONIN, LACTICIDVEN   SIGNED:  Deion Forgue T Manhattan Mccuen, MD  [facility] 11/24/2024, 10:36 AM

## 2024-11-24 NOTE — Plan of Care (Signed)

## 2024-11-24 NOTE — Discharge Instructions (Addendum)

## 2024-11-24 NOTE — Care Management Obs Status (Signed)
 MEDICARE OBSERVATION STATUS NOTIFICATION   Patient Details  Name: [name] MRN: [id number] Date of Birth: 1932-12-18   Medicare Observation Status Notification Given:  Yes Obs letter signed and copy given.   Sophiana Milanese 11/24/2024, 1:37 PM

## 2024-12-14 ENCOUNTER — Other Ambulatory Visit: Payer: Self-pay | Admitting: Physical Medicine and Rehabilitation

## 2025-05-13 ENCOUNTER — Other Ambulatory Visit

## 2025-05-13 ENCOUNTER — Ambulatory Visit: Admitting: Nurse Practitioner
